# Patient Record
Sex: Male | Born: 1955
Health system: Southern US, Community
[De-identification: ages and names within clinical notes are randomized; demographics above are authoritative.]

## PROBLEM LIST (undated history)

## (undated) ENCOUNTER — Ambulatory Visit (HOSPITAL_COMMUNITY): Admission: EM

## (undated) DIAGNOSIS — C3490 Malignant neoplasm of unspecified part of unspecified bronchus or lung: Secondary | ICD-10-CM

## (undated) DIAGNOSIS — R079 Chest pain, unspecified: Secondary | ICD-10-CM

## (undated) DIAGNOSIS — E119 Type 2 diabetes mellitus without complications: Secondary | ICD-10-CM

## (undated) DIAGNOSIS — R053 Chronic cough: Secondary | ICD-10-CM

## (undated) DIAGNOSIS — R569 Unspecified convulsions: Secondary | ICD-10-CM

## (undated) DIAGNOSIS — I10 Essential (primary) hypertension: Secondary | ICD-10-CM

## (undated) DIAGNOSIS — M7071 Other bursitis of hip, right hip: Secondary | ICD-10-CM

## (undated) DIAGNOSIS — F17210 Nicotine dependence, cigarettes, uncomplicated: Secondary | ICD-10-CM

## (undated) DIAGNOSIS — R05 Cough: Secondary | ICD-10-CM

## (undated) DIAGNOSIS — G9389 Other specified disorders of brain: Secondary | ICD-10-CM

## (undated) DIAGNOSIS — Z8589 Personal history of malignant neoplasm of other organs and systems: Secondary | ICD-10-CM

## (undated) DIAGNOSIS — C801 Malignant (primary) neoplasm, unspecified: Secondary | ICD-10-CM

## (undated) HISTORY — DX: Cough: R05

## (undated) HISTORY — DX: Nicotine dependence, cigarettes, uncomplicated: F17.210

## (undated) HISTORY — DX: Personal history of malignant neoplasm of other organs and systems: Z85.89

## (undated) HISTORY — PX: NO PAST SURGERIES: SHX2092

## (undated) HISTORY — DX: Essential (primary) hypertension: I10

## (undated) HISTORY — DX: Malignant neoplasm of unspecified part of unspecified bronchus or lung: C34.90

## (undated) HISTORY — DX: Other bursitis of hip, right hip: M70.71

## (undated) HISTORY — DX: Chronic cough: R05.3

---
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
## 1998-04-18 ENCOUNTER — Encounter: Payer: Self-pay | Admitting: Emergency Medicine

## 1998-04-18 ENCOUNTER — Emergency Department (HOSPITAL_COMMUNITY): Admission: EM | Admit: 1998-04-18 | Discharge: 1998-04-18 | Payer: Self-pay | Admitting: Emergency Medicine

## 1998-06-02 ENCOUNTER — Emergency Department (HOSPITAL_COMMUNITY): Admission: EM | Admit: 1998-06-02 | Discharge: 1998-06-02 | Payer: Self-pay | Admitting: Emergency Medicine

## 2000-03-03 ENCOUNTER — Emergency Department (HOSPITAL_COMMUNITY): Admission: EM | Admit: 2000-03-03 | Discharge: 2000-03-03 | Payer: Self-pay | Admitting: Emergency Medicine

## 2000-03-03 ENCOUNTER — Encounter: Payer: Self-pay | Admitting: Emergency Medicine

## 2000-06-23 ENCOUNTER — Emergency Department (HOSPITAL_COMMUNITY): Admission: EM | Admit: 2000-06-23 | Discharge: 2000-06-23 | Payer: Self-pay | Admitting: Emergency Medicine

## 2000-06-23 ENCOUNTER — Encounter: Payer: Self-pay | Admitting: Emergency Medicine

## 2001-01-28 ENCOUNTER — Emergency Department (HOSPITAL_COMMUNITY): Admission: EM | Admit: 2001-01-28 | Discharge: 2001-01-28 | Payer: Self-pay | Admitting: Emergency Medicine

## 2003-05-11 ENCOUNTER — Emergency Department (HOSPITAL_COMMUNITY): Admission: EM | Admit: 2003-05-11 | Discharge: 2003-05-11 | Payer: Self-pay | Admitting: Emergency Medicine

## 2004-02-05 ENCOUNTER — Emergency Department (HOSPITAL_COMMUNITY): Admission: EM | Admit: 2004-02-05 | Discharge: 2004-02-05 | Payer: Self-pay

## 2005-05-17 ENCOUNTER — Emergency Department (HOSPITAL_COMMUNITY): Admission: EM | Admit: 2005-05-17 | Discharge: 2005-05-17 | Payer: Self-pay | Admitting: Emergency Medicine

## 2005-10-11 ENCOUNTER — Emergency Department (HOSPITAL_COMMUNITY): Admission: EM | Admit: 2005-10-11 | Discharge: 2005-10-11 | Payer: Self-pay | Admitting: Emergency Medicine

## 2006-02-04 ENCOUNTER — Emergency Department (HOSPITAL_COMMUNITY): Admission: EM | Admit: 2006-02-04 | Discharge: 2006-02-04 | Payer: Self-pay | Admitting: Emergency Medicine

## 2008-01-25 ENCOUNTER — Emergency Department (HOSPITAL_COMMUNITY): Admission: EM | Admit: 2008-01-25 | Discharge: 2008-01-25 | Payer: Self-pay | Admitting: *Deleted

## 2009-06-19 ENCOUNTER — Emergency Department (HOSPITAL_COMMUNITY): Admission: EM | Admit: 2009-06-19 | Discharge: 2009-06-19 | Payer: Self-pay | Admitting: Emergency Medicine

## 2009-06-23 ENCOUNTER — Emergency Department (HOSPITAL_COMMUNITY): Admission: EM | Admit: 2009-06-23 | Discharge: 2009-06-23 | Payer: Self-pay | Admitting: Emergency Medicine

## 2013-07-08 ENCOUNTER — Ambulatory Visit: Payer: Self-pay

## 2017-02-07 ENCOUNTER — Other Ambulatory Visit: Payer: Self-pay | Admitting: Physician Assistant

## 2017-02-07 DIAGNOSIS — M79604 Pain in right leg: Secondary | ICD-10-CM

## 2017-02-07 DIAGNOSIS — M7989 Other specified soft tissue disorders: Secondary | ICD-10-CM

## 2017-02-08 ENCOUNTER — Ambulatory Visit
Admission: RE | Admit: 2017-02-08 | Discharge: 2017-02-08 | Disposition: A | Discharge status: Home / Self Care | Payer: Self-pay | Source: Ambulatory Visit | Attending: Physician Assistant | Admitting: Physician Assistant

## 2017-02-08 DIAGNOSIS — M79604 Pain in right leg: Secondary | ICD-10-CM

## 2017-02-08 DIAGNOSIS — M7989 Other specified soft tissue disorders: Secondary | ICD-10-CM

## 2018-06-23 ENCOUNTER — Other Ambulatory Visit: Payer: Self-pay

## 2018-06-23 ENCOUNTER — Emergency Department (HOSPITAL_COMMUNITY)
Admission: EM | Admit: 2018-06-23 | Discharge: 2018-06-24 | Disposition: A | Discharge status: Home / Self Care | Payer: Self-pay | Attending: Emergency Medicine | Admitting: Emergency Medicine

## 2018-06-23 ENCOUNTER — Emergency Department (HOSPITAL_COMMUNITY): Payer: Self-pay

## 2018-06-23 ENCOUNTER — Encounter (HOSPITAL_COMMUNITY): Payer: Self-pay | Admitting: Emergency Medicine

## 2018-06-23 DIAGNOSIS — I10 Essential (primary) hypertension: Secondary | ICD-10-CM | POA: Insufficient documentation

## 2018-06-23 DIAGNOSIS — R0789 Other chest pain: Secondary | ICD-10-CM | POA: Insufficient documentation

## 2018-06-23 DIAGNOSIS — F17200 Nicotine dependence, unspecified, uncomplicated: Secondary | ICD-10-CM | POA: Insufficient documentation

## 2018-06-23 LAB — I-STAT TROPONIN, ED: Troponin i, poc: 0.01 ng/mL (ref 0.00–0.08)

## 2018-06-23 MED ORDER — SODIUM CHLORIDE 0.9% FLUSH: 3.0000 mL | Freq: Once | INTRAVENOUS | Status: DC

## 2018-06-23 NOTE — ED Triage Notes (Signed)
Pt c/o chest pain that started 30 minutes PTA. Denies associated symptoms.

## 2018-06-24 LAB — I-STAT TROPONIN, ED: Troponin i, poc: 0 ng/mL (ref 0.00–0.08)

## 2018-06-24 LAB — CBC
HCT: 44.1 % (ref 39.0–52.0)
Hemoglobin: 15 g/dL (ref 13.0–17.0)
MCH: 30.4 pg (ref 26.0–34.0)
MCHC: 34 g/dL (ref 30.0–36.0)
MCV: 89.5 fL (ref 80.0–100.0)
Platelets: 477 10*3/uL — ABNORMAL HIGH (ref 150–400)
RBC: 4.93 MIL/uL (ref 4.22–5.81)
RDW: 13.1 % (ref 11.5–15.5)
WBC: 10.2 10*3/uL (ref 4.0–10.5)
nRBC: 0 % (ref 0.0–0.2)

## 2018-06-24 LAB — BASIC METABOLIC PANEL
Anion gap: 9 (ref 5–15)
BUN: 15 mg/dL (ref 8–23)
CO2: 23 mmol/L (ref 22–32)
Calcium: 9.2 mg/dL (ref 8.9–10.3)
Chloride: 108 mmol/L (ref 98–111)
Creatinine, Ser: 0.8 mg/dL (ref 0.61–1.24)
GFR calc Af Amer: >60 mL/min (ref >60)
GFR calc non Af Amer: >60 mL/min (ref >60)
Glucose, Bld: 113 mg/dL — ABNORMAL HIGH (ref 70–99)
Potassium: 4.3 mmol/L (ref 3.5–5.1)
Sodium: 140 mmol/L (ref 135–145)

## 2018-06-24 MED ORDER — LOSARTAN POTASSIUM 25 MG PO TABS: 25.0000 mg | ORAL_TABLET | Freq: Every day | ORAL | 0 refills | Status: DC

## 2018-06-24 MED ORDER — ASPIRIN 81 MG PO CHEW
324.0000 mg | CHEWABLE_TABLET | Freq: Once | ORAL | Status: AC
  Administered 2018-06-24: 324 mg via ORAL
  Filled 2018-06-24: qty 4

## 2018-06-24 NOTE — ED Provider Notes (Signed)
[facility] EMERGENCY DEPARTMENT Provider Note   CSN: [id number] Arrival date & time: 06/23/18  2314     History   Chief Complaint Chief Complaint  Patient presents with  . Chest Pain    HPI Jared Shea is a 63 y.o. male.  The history is provided by the patient.  Chest Pain  He has history of hypertension and comes in because of episodic chest pain which started yesterday.  He describes a sharp pain in the left lower anterior chest which last for a few seconds before going away.  Pain is rated at 7/10 when present.  There was slight associated dyspnea but no nausea or diaphoresis.  Pain does not seem to bother him when he is moving around but seems to come on more when he is laying still.  He denies history of diabetes or hyperlipidemia.  He is a cigarette smoker.  There is no family history of premature coronary atherosclerosis.  He had been seen at an urgent care center about 3 weeks ago and was told that his ECG at that time showed he had had a heart attack.  Past Medical History:  Diagnosis Date  . Hypertension     There are no active problems to display for this patient.   History reviewed. No pertinent surgical history.      Home Medications    Prior to Admission medications   Not on File    Family History No family history on file.  Social History Social History   Tobacco Use  . Smoking status: Current Every Day Smoker  . Smokeless tobacco: Never Used  Substance Use Topics  . Alcohol use: Yes  . Drug use: Never     Allergies   Penicillins   Review of Systems Review of Systems  Cardiovascular: Positive for chest pain.  All other systems reviewed and are negative.    Physical Exam Updated Vital Signs BP (!) 171/88 (BP Location: Left Arm)   Pulse 66   Temp 97.8 F (36.6 C) (Oral)   Resp 16   SpO2 98%   Physical Exam Vitals signs and nursing note reviewed.    63 year old male, resting comfortably and in no acute  distress. Vital signs are significant for elevated blood pressure. Oxygen saturation is 98%, which is normal. Head is normocephalic and atraumatic. PERRLA, EOMI. Oropharynx is clear. Neck is nontender and supple without adenopathy or JVD. Back is nontender and there is no CVA tenderness. Lungs are clear without rales, wheezes, or rhonchi. Chest is nontender. Heart has regular rate and rhythm without murmur. Abdomen is soft, flat, nontender without masses or hepatosplenomegaly and peristalsis is normoactive. Extremities have no cyanosis or edema, full range of motion is present. Skin is warm and dry without rash. Neurologic: Mental status is normal, cranial nerves are intact, there are no motor or sensory deficits.  ED Treatments / Results  Labs (all labs ordered are listed, but only abnormal results are displayed) Labs Reviewed  BASIC METABOLIC PANEL - Abnormal; Notable for the following components:      Result Value   Glucose, Bld 113 (*)    All other components within normal limits  CBC - Abnormal; Notable for the following components:   Platelets 477 (*)    All other components within normal limits  I-STAT TROPONIN, ED  I-STAT TROPONIN, ED    EKG EKG Interpretation  Date/Time:  Tuesday June 24 2018 01:28:15 EST Ventricular Rate:  63 PR Interval:  QRS Duration: 97 QT Interval:  397 QTC Calculation: 407 R Axis:   70 Text Interpretation:  Sinus rhythm Anteroseptal infarct, old Nonspecific ST abnormality No significant change was found Confirmed by Delora Fuel ([id number]) on 06/24/2018 3:17:12 AM   Radiology Dg Chest 2 View  Result Date: 06/23/2018 CLINICAL DATA:  Left upper chest pain for 1 hour EXAM: CHEST - 2 VIEW COMPARISON:  None. FINDINGS: The heart size and mediastinal contours are within normal limits. Both lungs are hyperinflated but clear. The visualized skeletal structures are unremarkable. IMPRESSION: COPD without acute abnormality. Electronically Signed   By:  Inez Catalina M.D.   On: 06/23/2018 23:46    Procedures Procedures  Medications Ordered in ED Medications  sodium chloride flush (NS) 0.9 % injection 3 mL (has no administration in time range)     Initial Impression / Assessment and Plan / ED Course  I have reviewed the triage vital signs and the nursing notes.  Pertinent labs & imaging results that were available during my care of the patient were reviewed by me and considered in my medical decision making (see chart for details).  Chest pain which seems quite atypical for ACS.  Old records are reviewed confirming urgent care visit on January 21.  However, I cannot see the ECG that was done on that occasion nor can I see a report from it.  He was started on Cozaar at that visit.  He will be held in the ED for delta troponin.  If negative, will discharge with referral to cardiology.  ECG shows minor, nonspecific ST changes.  Repeat troponin is normal.  He is referred to cardiology for outpatient work-up.  Final Clinical Impressions(s) / ED Diagnoses   Final diagnoses:  Atypical chest pain    ED Discharge Orders    None       Delora Fuel, MD 70/96/43 [phone]

## 2018-06-24 NOTE — ED Notes (Signed)
Patient verbalizes understanding of medications and discharge instructions. No further questions at this time. VSS and patient ambulatory at discharge.   

## 2018-06-24 NOTE — Discharge Instructions (Addendum)
Your evaluation in the Emergency Department did not show any sign of heart damage. Please follow up with the cardiologist for further evaluation of your heart.  Return to the Emergency Department if your symptoms are getting worse.

## 2018-07-28 ENCOUNTER — Ambulatory Visit (INDEPENDENT_AMBULATORY_CARE_PROVIDER_SITE_OTHER): Payer: Self-pay | Admitting: Internal Medicine

## 2018-07-28 VITALS — BP 154/82 | HR 68 | Temp 97.7°F | Wt 190.4 lb

## 2018-07-28 DIAGNOSIS — E1159 Type 2 diabetes mellitus with other circulatory complications: Secondary | ICD-10-CM | POA: Insufficient documentation

## 2018-07-28 DIAGNOSIS — I10 Essential (primary) hypertension: Secondary | ICD-10-CM

## 2018-07-28 DIAGNOSIS — Z79899 Other long term (current) drug therapy: Secondary | ICD-10-CM

## 2018-07-28 HISTORY — DX: Essential (primary) hypertension: I10

## 2018-07-28 HISTORY — DX: Type 2 diabetes mellitus with other circulatory complications: E11.59

## 2018-07-28 MED ORDER — LOSARTAN POTASSIUM-HCTZ 50-12.5 MG PO TABS: 1.0000 | ORAL_TABLET | Freq: Every day | ORAL | 2 refills | Status: DC

## 2018-07-28 MED FILL — LOSARTAN-HCTZ 50-12.5 MG TA: 50-12.5 | 30 days supply | Qty: 30 | Fill #0 | Status: TO

## 2018-07-28 NOTE — Progress Notes (Signed)
Internal Medicine Clinic Attending  Case discussed with Dr. Hoffman at the time of the visit.  We reviewed the resident's history and exam and pertinent patient test results.  I agree with the assessment, diagnosis, and plan of care documented in the resident's note.  

## 2018-07-28 NOTE — Patient Instructions (Addendum)
Jared Shea,  It was a pleasure meeting you today. Your new medication has been sent to the [facility].

## 2018-07-28 NOTE — Progress Notes (Signed)
   CC: high blood pressure  HPI:  [name] is a 63 y.o. male with history noted below that presents to the internal medicine clinic for assessment of essential hypertension. Please see problem based charting for the status of patient's chronic medical conditions.  Past Medical History:  Diagnosis Date  . Hypertension     Review of Systems:  Review of Systems  Respiratory: Negative for shortness of breath.   Cardiovascular: Negative for chest pain.  Neurological: Negative for dizziness.     Physical Exam:  Vitals:   07/28/18 1031  BP: (!) 154/82  Pulse: 68  Temp: 97.7 F (36.5 C)  TempSrc: Oral  SpO2: 98%  Weight: 190 lb 6.4 oz (86.4 kg)   Physical Exam  Constitutional: He is well-developed, well-nourished, and in no distress.  Cardiovascular: Normal rate, regular rhythm and normal heart sounds. Exam reveals no gallop and no friction rub.  No murmur heard. Pulmonary/Chest: Breath sounds normal. No respiratory distress. He has no wheezes. He has no rales.    Assessment & Plan:   See encounters tab for problem based medical decision making.   Patient discussed with Dr. Angelia Mould

## 2018-07-28 NOTE — Assessment & Plan Note (Addendum)
History of present illness:  Patient started taking losartan at the end of January for elevated blood pressure at urgent care. Today he presents for assessment of blood pressure and refills.  Patient reports adherence to medication.  He denies side effects of the medication.  Assessment: Essential hypertension Patient was started on losartan 2 months ago and blood pressure is still elevated at 154/82. At this time will increase losartan and add hydrochlorothiazide to use as a combination pill. This was sent to the [facility].  Patient had recent blood work in the ED last month and will not repeat at this time.  Plan -Losartan-hydrochlorothiazide-50-12.5 -bmet at next visit in 3 months -Can get a hemoglobin A1c and lipid panel at next visit when patient has insurance

## 2018-08-01 ENCOUNTER — Telehealth: Payer: Self-pay | Admitting: Cardiology

## 2018-08-01 ENCOUNTER — Encounter: Payer: Self-pay | Admitting: Cardiology

## 2018-08-01 NOTE — Telephone Encounter (Signed)
Called pt and left message asking pt to call back to update medical and family history and to get previous physician information.

## 2018-08-08 ENCOUNTER — Telehealth: Payer: Self-pay

## 2018-08-08 NOTE — Telephone Encounter (Signed)
TELEPHONE CALL NOTE  This patient has been deemed a candidate for follow-up tele-health visit to limit community exposure during the Covid-19 pandemic. I spoke with the patient via phone to discuss instructions. This has been outlined on the patient's AVS (dotphrase: hcevisitinfo). The patient was advised to review the section on consent for treatment as well. The patient will receive a phone call 2-3 days prior to their E-Visit at which time consent will be verbally confirmed. A Virtual Office Visit appointment type has been scheduled for 9 am 08/12/18 with Dr. Radford Pax.  Sarina Ill, RN 08/08/2018 9:01 AM    In the setting of the current Covid19 crisis, you are scheduled for a (phone or video) visit with your provider on (date) at (time).  Just as we do with many in-office visits, in order for you to participate in this visit, we must obtain consent.  If you'd like, I can send this to your mychart (if signed up) or email for you to review.  Otherwise, I can obtain your verbal consent now.  All virtual visits are billed to your insurance company just like a normal visit would be.  By agreeing to a virtual visit, we'd like you to understand that the technology does not allow for your provider to perform an examination, and thus may limit your provider's ability to fully assess your condition.  Finally, though the technology is pretty good, we cannot assure that it will always work on either your or our end, and in the setting of a video visit, we may have to convert it to a phone-only visit.  In either situation, we cannot ensure that we have a secure connection.  Are you willing to proceed?  TELEPHONE CALL NOTE  Jared Shea has been deemed a candidate for a follow-up tele-health visit to limit community exposure during the Covid-19 pandemic. I spoke with the patient via phone to ensure availability of phone/video source, confirm preferred email & phone number, and discuss instructions and  expectations.  I reminded Jared Shea to be prepared with any vital sign and/or heart rhythm information that could potentially be obtained via home monitoring, at the time of his visit. I reminded Jared Shea to expect an e-mail containing a link for their video-based visit approximately 15 minutes before his visit, or alternatively, a phone call at the time of his visit if his visit is planned to be a phone encounter.  STAFF MUST READ CONSENT VERBATIM TO PATIENT BELOW - Did the patient verbally consent to treatment as below? [place], RN 08/08/2018 9:02 AM  DOWNLOADING THE SOFTWARE (If applicable)  Download the [employer] app to enable video and telephone visits with your [facility] Provider.   Instructions for downloading [employer] WebEx: - Go to https://www.webex.com/downloads.html and follow the instructions - If you have technical difficulties with downloading WebEx, please call WebEx at [phone]. - Once the app is downloaded (can be done on either mobile or desktop computer), go to Settings in the upper left hand corner.  Be sure that camera and audio are enabled.  - You will receive an email message with a link to the meeting with a time to join for your tele-health visit.  - Please download the app and have settings configured prior to the appointment time.    CONSENT FOR TELE-HEALTH VISIT - PLEASE REVIEW  I hereby voluntarily request, consent and authorize [facility] and its employed or contracted physicians, Engineer, materials, nurse practitioners  or other licensed health care professionals (the Practitioner), to provide me with telemedicine health care services (the "Services") as deemed necessary by the treating Practitioner. I acknowledge and consent to receive the Services by the Practitioner via telemedicine. I understand that the telemedicine visit will involve communicating with the Practitioner through live audiovisual communication technology and  the disclosure of certain medical information by electronic transmission. I acknowledge that I have been given the opportunity to request an in-person assessment or other available alternative prior to the telemedicine visit and am voluntarily participating in the telemedicine visit.  I understand that I have the right to withhold or withdraw my consent to the use of telemedicine in the course of my care at any time, without affecting my right to future care or treatment, and that the Practitioner or I may terminate the telemedicine visit at any time. I understand that I have the right to inspect all information obtained and/or recorded in the course of the telemedicine visit and may receive copies of available information for a reasonable fee.  I understand that some of the potential risks of receiving the Services via telemedicine include:  Marland Kitchen Delay or interruption in medical evaluation due to technological equipment failure or disruption; . Information transmitted may not be sufficient (e.g. poor resolution of images) to allow for appropriate medical decision making by the Practitioner; and/or  . In rare instances, security protocols could fail, causing a breach of personal health information.  Furthermore, I acknowledge that it is my responsibility to provide information about my medical history, conditions and care that is complete and accurate to the best of my ability. I acknowledge that Practitioner's advice, recommendations, and/or decision may be based on factors not within their control, such as incomplete or inaccurate data provided by me or distortions of diagnostic images or specimens that may result from electronic transmissions. I understand that the practice of medicine is not an exact science and that Practitioner makes no warranties or guarantees regarding treatment outcomes. I acknowledge that I will receive a copy of this consent concurrently upon execution via email to the email address I  last provided but may also request a printed copy by calling the office of [facility].    I understand that my insurance will be billed for this visit.   I have read or had this consent read to me. . I understand the contents of this consent, which adequately explains the benefits and risks of the Services being provided via telemedicine.  . I have been provided ample opportunity to ask questions regarding this consent and the Services and have had my questions answered to my satisfaction. . I give my informed consent for the services to be provided through the use of telemedicine in my medical care  By participating in this telemedicine visit I agree to the above.

## 2018-08-11 NOTE — Progress Notes (Signed)
Virtual Visit via Telephone Note    Evaluation Performed:  Follow-up visit  This visit type was conducted due to national recommendations for restrictions regarding the COVID-19 Pandemic (e.g. social distancing).  This format is felt to be most appropriate for this patient at this time.  All issues noted in this document were discussed and addressed.  No physical exam was performed (except for noted visual exam findings with Video Visits).  Please refer to the patient's chart (MyChart message for video visits and phone note for telephone visits) for the patient's consent to telehealth for [facility].  Date:  08/12/2018   ID:  Jared Shea, Jared Shea 01-03-1956, MRN [id number]  Patient Location:  [facility]  Provider location:   [facility]  PCP:  Patient, No Pcp Per Referred by [facility] ER Cardiologist:  New Electrophysiologist:  None   Chief Complaint:  Chest pain  History of Present Illness:    Jared Shea is a 63 y.o. male who presents via audio/video conferencing for a telehealth visit today.    This is a 63yo male with a history of HTN who presented to ER on 06/24/2018 with complaints of chest pain.  He tells me that the pain was sharp in the lower anterior chest lasting a few seconds at a time and then would go away.  There was no nausea or diaphoresis but he did have some mild SOB.  The pain was nonexertional and maybe worse when laying down. He initially was seen in Urgent Care and was told he had an EKG that looked like he had had an MI. In ER EKG showed anteroseptal infarct pattern and nonspecific ST abnormality.  He ruled out for MI with normal Trop.  He was placed on antihypertensive medication because his blood pressure was poorly controlled.  Now referred for further evaluation.  Since going to the ER a month ago his CP has resolved.  He said he did have some more chest discomfort when he ran out of his blood pressure medicine but now it has resolved and he has not had any in a while.   Even though he told the ER his pain was sharp he tells me that it was more of a pressure sensation but there was no radiation of the pain and no associated nausea or diaphoresis.  He denies any PND, orthopnea, lower extremity edema, palpitations or dizziness.  He says once in a great while he will have some mild dyspnea on exertion with certain activities.  He does smoke and 1 pack of cigarettes will last him a few days.  He is eager to try to quit smoking.  He did ask about medications to help quit smoking.  He does have a family history of CAD in his mother had a CABG and he thinks PCI in the past.  The patient does not symptoms concerning for COVID-19 infection (fever, chills, cough, or new shortness of breath).    Prior CV studies:   The following studies were reviewed today:  Hospital notes and labs, EKG  Past Medical History:  Diagnosis Date  . Bursitis of right hip   . Chronic cough   . Dependence on nicotine from cigarettes   . Essential hypertension 07/28/2018   Past Surgical History:  Procedure Laterality Date  . NO PAST SURGERIES       Current Meds  Medication Sig  . aspirin EC 325 MG tablet Take 325 mg by mouth daily.  Marland Kitchen losartan-hydrochlorothiazide (HYZAAR) 50-12.5 MG tablet Take  1 tablet by mouth daily.     Allergies:   Penicillins   Social History   Tobacco Use  . Smoking status: Current Every Day Smoker    Years: 48.00    Types: Cigarettes    Start date: 08/07/1970  . Smokeless tobacco: Never Used  Substance Use Topics  . Alcohol use: Yes  . Drug use: Never     Family Hx: The patient's family history includes Diabetes in his brother; Healthy in his daughter, daughter, and son; Heart disease in his mother; Hypertension in his sister; Kidney disease in his brother and sister.  ROS:   Please see the history of present illness.     All other systems reviewed and are negative.   Labs/Other Tests and Data Reviewed:    Recent Labs: 06/23/2018: BUN 15;  Creatinine, Ser 0.80; Hemoglobin 15.0; Platelets 477; Potassium 4.3; Sodium 140   Recent Lipid Panel No results found for: CHOL, TRIG, HDL, CHOLHDL, LDLCALC, LDLDIRECT  Wt Readings from Last 3 Encounters:  08/12/18 198 lb (89.8 kg)  07/28/18 190 lb 6.4 oz (86.4 kg)  06/24/18 170 lb (77.1 kg)     Exam:    Vital Signs:  BP 120/81 Comment: per pt  Ht 6\' 1"  (1.854 m)   Wt 198 lb (89.8 kg)   BMI 26.12 kg/m    Well nourished, well developed male in no acute distress.   ASSESSMENT & PLAN:    1.  Chest pain -I suspect his chest pain is more related to poorly controlled high blood pressure.  Ever since going on blood pressure medicines he has not really had any more chest pressure.  That being said, he does have cardiac risk factors including ongoing tobacco abuse, hypertension and a family history of CAD.  I have recommended getting a stress Myoview and 2D echocardiogram.  In the setting of current [place] crisis, but the patient is asymptomatic and I think he can wait until June to have his studies done.  I have encouraged him to continue taking his aspirin but decrease it to 81 mg daily.  I have also told him to let me know if he has recurrent chest pain.  2.  HTN - BP is controlled on exam today.  He will continue on Losartan-HCT 50-12.5mg  daily.  His last creatinine was 0.8 and K+ 4.3.  3.  Tobacco abuse counseling - we discussed the need to try to cut back and quit tobacco.  He is motivated to quit and did ask about smoking cessation drugs.  We discussed Chantix.  He wants to think about it and talk to his pharmacy about how much it will cost and then he will give [place] a call.  COVID-19 Education: The signs and symptoms of COVID-19 were discussed with the patient and how to seek care for testing (follow up with PCP or arrange E-visit).  The importance of social distancing was discussed today.  Patient Risk:   After full review of this patients clinical status, I feel that they are at  least moderate risk at this time.  Time:   Today, I have spent 25 minutes with the patient with telehealth technology discussing chest pain, hypertension, tobacco abuse, testing needed and teaching on prevention of covid 19 and symptoms associated with.     Medication Adjustments/Labs and Tests Ordered: Current medicines are reviewed at length with the patient today.  Concerns regarding medicines are outlined above.  Tests Ordered: No orders of the defined types were placed in  this encounter.  Medication Changes: No orders of the defined types were placed in this encounter.   Disposition:  Follow up in 4 month(s)  Signed, Fransico Him, MD  08/12/2018 10:05 AM    [facility]

## 2018-08-12 ENCOUNTER — Other Ambulatory Visit: Payer: Self-pay

## 2018-08-12 ENCOUNTER — Encounter: Payer: Self-pay | Admitting: Cardiology

## 2018-08-12 ENCOUNTER — Telehealth (INDEPENDENT_AMBULATORY_CARE_PROVIDER_SITE_OTHER): Payer: Self-pay | Admitting: Cardiology

## 2018-08-12 DIAGNOSIS — F17210 Nicotine dependence, cigarettes, uncomplicated: Secondary | ICD-10-CM

## 2018-08-12 DIAGNOSIS — F17200 Nicotine dependence, unspecified, uncomplicated: Secondary | ICD-10-CM | POA: Insufficient documentation

## 2018-08-12 DIAGNOSIS — R079 Chest pain, unspecified: Secondary | ICD-10-CM | POA: Insufficient documentation

## 2018-08-12 DIAGNOSIS — Z716 Tobacco abuse counseling: Secondary | ICD-10-CM

## 2018-08-12 DIAGNOSIS — I10 Essential (primary) hypertension: Secondary | ICD-10-CM

## 2018-08-12 HISTORY — DX: Chest pain, unspecified: R07.9

## 2018-08-12 NOTE — Addendum Note (Signed)
Addended by: Sarina Ill on: 08/12/2018 01:29 PM   Modules accepted: Orders

## 2018-08-12 NOTE — Patient Instructions (Signed)
Medication Instructions:  Your physician recommends that you continue on your current medications as directed. Please refer to the Current Medication list given to you today.  If you need a refill on your cardiac medications before your next appointment, please call your pharmacy.   Lab work: None If you have labs (blood work) drawn today and your tests are completely normal, you will receive your results only by: Marland Kitchen MyChart Message (if you have MyChart) OR . A paper copy in the mail If you have any lab test that is abnormal or we need to change your treatment, we will call you to review the results.  Testing/Procedures: Your physician has requested that you have an echocardiogram around June. Echocardiography is a painless test that uses sound waves to create images of your heart. It provides your doctor with information about the size and shape of your heart and how well your heart's chambers and valves are working. This procedure takes approximately one hour. There are no restrictions for this procedure.  Your physician has requested that you have en exercise stress myoview around June. For further information please visit [email or web address]. Please follow instruction sheet, as given.  Follow-Up:Your physician recommends that you schedule a follow-up appointment in: In July, after your results of your tests. Our office will call to schedule your appointment.

## 2018-08-12 NOTE — Addendum Note (Signed)
Addended by: Sarina Ill on: 08/12/2018 03:52 PM   Modules accepted: Orders

## 2018-09-01 ENCOUNTER — Telehealth: Payer: Self-pay | Admitting: Cardiology

## 2018-09-01 ENCOUNTER — Other Ambulatory Visit: Payer: Self-pay | Admitting: Cardiology

## 2018-09-01 ENCOUNTER — Other Ambulatory Visit: Payer: Self-pay

## 2018-09-01 MED ORDER — LOSARTAN POTASSIUM-HCTZ 50-12.5 MG PO TABS: 1.0000 | ORAL_TABLET | Freq: Every day | ORAL | 2 refills | Status: DC

## 2018-09-01 NOTE — Telephone Encounter (Signed)
losartan-hydrochlorothiazide (HYZAAR) 50-12.5 MG tablet, refill request @ [facility].

## 2018-09-01 NOTE — Telephone Encounter (Signed)
Pt calling requesting a refill on losartan-HCTZ. Would Dr. Radford Pax like to refill this medication? Please address

## 2018-09-01 NOTE — Telephone Encounter (Signed)
°*  STAT* If patient is at the pharmacy, call can be transferred to refill team.   1. Which medications need to be refilled? (please list name of each medication and dose if known) losartan-hydrochlorothiazide (HYZAAR) 50-12.5 MG tablet  2. Which pharmacy/location (including street and city if local pharmacy) is medication to be sent to? [facility] on [address]   3. Do they need a 30 day or 90 day supply? 30 days.

## 2018-09-02 NOTE — Telephone Encounter (Signed)
Informed pt his rx was refilled by his cardiologist's office and sent to [facility] [place]. States he will call the pharmacy.

## 2018-09-02 NOTE — Telephone Encounter (Signed)
Pt's medication was already sent to his pharmacy as requested. Confirmation received.

## 2018-09-06 MED FILL — LOSARTAN POTASSIUM 50 MG TA: 50 | 30 days supply | Qty: 30 | Fill #0

## 2018-09-06 MED FILL — HYDROCHLOROTHIAZIDE 12.5 MG: 12.5 | 30 days supply | Qty: 30 | Fill #0

## 2018-09-15 ENCOUNTER — Telehealth: Payer: Self-pay

## 2018-09-15 NOTE — Telephone Encounter (Signed)
Pt states he still have not received bp med. Please call pt back.

## 2018-09-15 NOTE — Telephone Encounter (Signed)
Refill Request-Paid [facility] 2 weeks ago and the medicine has not been mailed to him yet.  Patient would like a call back.  losartan-hydrochlorothiazide (HYZAAR) 50-12.5 MG tablet

## 2018-09-15 NOTE — Telephone Encounter (Signed)
Patient given number to [place] [phone]. Hubbard Hartshorn, RN, BSN

## 2018-10-02 ENCOUNTER — Ambulatory Visit (INDEPENDENT_AMBULATORY_CARE_PROVIDER_SITE_OTHER): Payer: Self-pay | Admitting: Internal Medicine

## 2018-10-02 ENCOUNTER — Other Ambulatory Visit: Payer: Self-pay

## 2018-10-02 VITALS — BP 133/74 | HR 78 | Temp 97.9°F | Wt 189.7 lb

## 2018-10-02 DIAGNOSIS — Z716 Tobacco abuse counseling: Secondary | ICD-10-CM

## 2018-10-02 DIAGNOSIS — Z79899 Other long term (current) drug therapy: Secondary | ICD-10-CM

## 2018-10-02 DIAGNOSIS — F17210 Nicotine dependence, cigarettes, uncomplicated: Secondary | ICD-10-CM

## 2018-10-02 DIAGNOSIS — I10 Essential (primary) hypertension: Secondary | ICD-10-CM

## 2018-10-02 NOTE — Patient Instructions (Addendum)
Thank you for coming to the clinic today. It was a pleasure to see you.   For your hypertension - continue taking losartan and HCTZ   Keep up the hard work that you are doing to cut back on the cigarette smoking. Check out 1-800 quit website for advice and guidance.   Please call the internal medicine center clinic if you have any questions or concerns, we may be able to help and keep you from a long and expensive emergency room wait. Our clinic and after hours phone number is [phone], the best time to call is Monday through Friday 9 am to 4 pm but there is always someone available 24/7 if you have an emergency. If you need medication refills please notify your pharmacy one week in advance and they will send [place] a request.

## 2018-10-02 NOTE — Progress Notes (Signed)
   CC: follow up of hypertension   HPI:  Jared Shea is a 63 y.o. with PMH as listed below who presents for follow up of hypertension. Please see the assessment and plans for the status of the patient chronic medical problems.   Past Medical History:  Diagnosis Date  . Bursitis of right hip   . Chronic cough   . Dependence on nicotine from cigarettes   . Essential hypertension 07/28/2018   Review of Systems:  Refer to history of present illness and assessment and plans for pertinent review of systems, all others reviewed and negative  Physical Exam:  Vitals:   10/02/18 1046  BP: 133/74  Pulse: 78  Temp: 97.9 F (36.6 C)  TempSrc: Oral  SpO2: 100%  Weight: 189 lb 11.2 oz (86 kg)   General: well appearing, no acute distress Eyes: no conjunctivitis, no jaundice  Cardiac: regular rate and rhythm, no murmurs, rubs, or gallops, no peripheral edema  Pulm: lungs are clear to auscultation, no wheezes or rhonchi  Skin: no rashes evident on the exposed skin   Assessment & Plan:   Follow up of blood pressure  Blood pressure well controlled today. Currently prescribed losartan and HCTZ  - continue losartan and HCTZ  - continue to encourage cessation of tobacco as described below   Tobacco use Patient interested in cutting back on tobacco use. Smokes 1/2 PPD has cut back from a whole pack per day. Has tried patch in the past, still craved cigarettes with the patch. Requesting to try nicotine gum today .   - encouraged the use of patch for a baseline nicotine replacement with gum use for cravings  - provided sample of nicotine gum with instructions on use  See Encounters Tab for problem based charting.  Patient discussed with Dr. Dareen Piano

## 2018-10-03 LAB — BMP8+ANION GAP
Anion Gap: 18 mmol/L (ref 10.0–18.0)
BUN/Creatinine Ratio: 21 (ref 10–24)
BUN: 16 mg/dL (ref 8–27)
CO2: 20 mmol/L (ref 20–29)
Calcium: 9.6 mg/dL (ref 8.6–10.2)
Chloride: 100 mmol/L (ref 96–106)
Creatinine, Ser: 0.78 mg/dL (ref 0.76–1.27)
GFR calc Af Amer: 110 mL/min/{1.73_m2} (ref >59)
GFR calc non Af Amer: 95 mL/min/{1.73_m2} (ref >59)
Glucose: 87 mg/dL (ref 65–99)
Potassium: 4.6 mmol/L (ref 3.5–5.2)
Sodium: 138 mmol/L (ref 134–144)

## 2018-10-07 ENCOUNTER — Encounter: Payer: Self-pay | Admitting: Internal Medicine

## 2018-10-07 NOTE — Assessment & Plan Note (Signed)
Blood pressure well controlled today. Currently prescribed losartan and HCTZ  - continue losartan and HCTZ  - continue to encourage cessation of tobacco as described below

## 2018-10-07 NOTE — Assessment & Plan Note (Signed)
Patient interested in cutting back on tobacco use. Smokes 1/2 PPD has cut back from a whole pack per day. Has tried patch in the past, still craved cigarettes with the patch. Requesting to try nicotine gum today .   - encouraged the use of patch for a baseline nicotine replacement with gum use for cravings  - provided sample of nicotine gum with instructions on use

## 2018-10-08 NOTE — Progress Notes (Signed)
Internal Medicine Clinic Attending  Case discussed with Dr. Blum at the time of the visit.  We reviewed the resident's history and exam and pertinent patient test results.  I agree with the assessment, diagnosis, and plan of care documented in the resident's note. 

## 2018-10-15 ENCOUNTER — Telehealth (HOSPITAL_COMMUNITY): Payer: Self-pay

## 2018-10-15 NOTE — Telephone Encounter (Signed)
Spoke with the patient, instructions given. He stated he would be here. Asked to call back with any questions. S.Tremaine Fuhriman EMTP

## 2018-10-17 ENCOUNTER — Telehealth (HOSPITAL_COMMUNITY): Payer: Self-pay | Admitting: *Deleted

## 2018-10-17 NOTE — Telephone Encounter (Signed)
COVID-19 Pre-Screening Questions:  . Do you currently have a fever? NO (yes = cancel and refer to pcp for e-visit) . Have you recently travelled on a cruise, internationally, or to [place], [place], [place], [place], [place], or [place], [place] [employer]) ? NO (yes = cancel, stay home, monitor symptoms, and contact pcp or initiate e-visit if symptoms develop) . Have you been in contact with someone that is currently pending confirmation of Covid19 testing or has been confirmed to have the [place] virus?  NO (yes = cancel, stay home, away from tested individual, monitor symptoms, and contact pcp or initiate e-visit if symptoms develop) . Are you currently experiencing fatigue or cough? NO (yes = pt should be prepared to have a mask placed at the time of their visit).  . Reiterated no additional visitors. Eartha Inch no earlier than 15 minutes before appointment time. . Please bring own mask.  Confusion with tests, having Nuclear and Echo Monday, think he is straightened out.    Jared Shea

## 2018-10-20 ENCOUNTER — Ambulatory Visit (HOSPITAL_COMMUNITY): Payer: Self-pay | Attending: Cardiology

## 2018-10-20 ENCOUNTER — Ambulatory Visit (HOSPITAL_BASED_OUTPATIENT_CLINIC_OR_DEPARTMENT_OTHER): Payer: Self-pay

## 2018-10-20 ENCOUNTER — Other Ambulatory Visit: Payer: Self-pay

## 2018-10-20 DIAGNOSIS — R079 Chest pain, unspecified: Secondary | ICD-10-CM | POA: Insufficient documentation

## 2018-10-20 LAB — ECHOCARDIOGRAM COMPLETE
Height: 73 in
Weight: 3024 oz

## 2018-10-20 LAB — MYOCARDIAL PERFUSION IMAGING
LV dias vol: 105 mL (ref 62–150)
LV sys vol: 48 mL
Peak HR: 88 {beats}/min
Rest HR: 65 {beats}/min
SDS: 0
SRS: 0
SSS: 0
TID: 1.1

## 2018-10-20 MED ORDER — REGADENOSON 0.4 MG/5ML IV SOLN
0.4000 mg | Freq: Once | INTRAVENOUS | Status: AC
  Administered 2018-10-20: 0.4 mg via INTRAVENOUS

## 2018-10-20 MED ORDER — TECHNETIUM TC 99M TETROFOSMIN IV KIT
33.0000 | PACK | Freq: Once | INTRAVENOUS | Status: AC | PRN
  Administered 2018-10-20: 33 via INTRAVENOUS
  Filled 2018-10-20: qty 33

## 2018-10-20 MED ORDER — TECHNETIUM TC 99M TETROFOSMIN IV KIT
11.0000 | PACK | Freq: Once | INTRAVENOUS | Status: AC | PRN
  Administered 2018-10-20: 11 via INTRAVENOUS
  Filled 2018-10-20: qty 11

## 2018-11-18 ENCOUNTER — Other Ambulatory Visit: Payer: Self-pay | Admitting: Internal Medicine

## 2018-11-18 MED ORDER — LOSARTAN POTASSIUM-HCTZ 50-12.5 MG PO TABS: 1.0000 | ORAL_TABLET | Freq: Every day | ORAL | 5 refills | Status: DC

## 2018-11-18 MED FILL — LOSARTAN-HCTZ 50-12.5 MG TA: 50-12.5 | 30 days supply | Qty: 30 | Fill #0

## 2018-11-18 NOTE — Telephone Encounter (Signed)
Needs refill on losartan-hydrochlorothiazide (HYZAAR) 50-12.5 MG tablet [place], [place] - [address].  [phone]

## 2018-12-24 ENCOUNTER — Other Ambulatory Visit: Payer: Self-pay

## 2018-12-24 ENCOUNTER — Ambulatory Visit (INDEPENDENT_AMBULATORY_CARE_PROVIDER_SITE_OTHER): Payer: Self-pay | Admitting: Internal Medicine

## 2018-12-24 ENCOUNTER — Encounter: Payer: Self-pay | Admitting: Internal Medicine

## 2018-12-24 DIAGNOSIS — I10 Essential (primary) hypertension: Secondary | ICD-10-CM

## 2018-12-24 DIAGNOSIS — Z716 Tobacco abuse counseling: Secondary | ICD-10-CM

## 2018-12-24 DIAGNOSIS — Z79899 Other long term (current) drug therapy: Secondary | ICD-10-CM

## 2018-12-24 DIAGNOSIS — R42 Dizziness and giddiness: Secondary | ICD-10-CM

## 2018-12-24 DIAGNOSIS — G4700 Insomnia, unspecified: Secondary | ICD-10-CM

## 2018-12-24 DIAGNOSIS — Z720 Tobacco use: Secondary | ICD-10-CM

## 2018-12-24 DIAGNOSIS — Z0000 Encounter for general adult medical examination without abnormal findings: Secondary | ICD-10-CM | POA: Insufficient documentation

## 2018-12-24 MED ORDER — LOSARTAN POTASSIUM-HCTZ 50-12.5 MG PO TABS: 1.0000 | ORAL_TABLET | Freq: Every day | ORAL | 5 refills | Status: DC

## 2018-12-24 MED FILL — LOSARTAN-HCTZ 50-12.5 MG TA: 50-12.5 | 30 days supply | Qty: 30 | Fill #1

## 2018-12-24 NOTE — Assessment & Plan Note (Signed)
Hypertension: Well-controlled on losartan-HCTZ 50-12.5 mg daily.  He is doing very well and is tolerating medication.  BP Readings from Last 3 Encounters:  12/24/18 129/80  10/02/18 133/74  08/12/18 120/81   Plan: -Continue losartan-HCTZ 50-25 mg daily.

## 2018-12-24 NOTE — Telephone Encounter (Signed)
[facility]ar, they will also put some melantonin out for him

## 2018-12-24 NOTE — Progress Notes (Signed)
   CC: Dizziness  HPI:  [name] is a 63 y.o. with medical history significant for hypertension and tobacco use disorder presenting for evaluation of dizziness.  Please see problem based charting for further details.  Past Medical History:  Diagnosis Date  . Bursitis of right hip   . Chronic cough   . Dependence on nicotine from cigarettes   . Essential hypertension 07/28/2018   Review of Systems:  As per HPI  Physical Exam:  Vitals:   12/24/18 0954 12/24/18 0955 12/24/18 0956 12/24/18 0957  BP: 134/77 131/80 135/81 129/80  Pulse: 65 64 67 74  Temp: 98 F (36.7 C)     TempSrc: Oral     SpO2: 100%     Weight: 189 lb 11.2 oz (86 kg)      Physical Exam  Constitutional: He is oriented to person, place, and time and well-developed, well-nourished, and in no distress. No distress.  HENT:  Head: Normocephalic and atraumatic.  Eyes: Conjunctivae are normal.  Neck: Neck supple.  Cardiovascular: Normal rate, regular rhythm and normal heart sounds.  No murmur heard. Pulmonary/Chest: Effort normal and breath sounds normal. He has no wheezes. He has no rales.  Abdominal: Soft. Bowel sounds are normal. He exhibits no distension.  Neurological: He is alert and oriented to person, place, and time. No cranial nerve deficit. Gait normal. GCS score is 15.  Skin: He is not diaphoretic.  Psychiatric: Mood and affect normal.    Assessment & Plan:   See Encounters Tab for problem based charting.  Patient discussed with Dr. Dareen Piano

## 2018-12-24 NOTE — Assessment & Plan Note (Signed)
Advised on tobacco cessation.

## 2018-12-24 NOTE — Assessment & Plan Note (Signed)
He will receive his tetanus vaccine today.  He is due for hepatitis C screening, HIV, colonoscopy.  He wants to defer hepatitis C screen at next visit.  He is currently uninsured but states that he is waiting to get his Medicaid card before he undergoes colonoscopy.

## 2018-12-24 NOTE — Assessment & Plan Note (Signed)
Dizziness: Jared Shea reports that for the past month he has been experiencing dizziness which happens when he stands up quickly.  He denies any prodrome symptoms or chest pain, palpitation, dysrhythmia, nausea, vomiting, fevers, chills.  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  He works about 12 hours a day and usually drinks only 1-2 bottles of Gatorade throughout the entire day.  He has not experienced syncope and usually tries to get up slowly when he feels dizzy or lightheaded.  He states that sometimes these episodes will last for about 20 seconds but then will spontaneously resolve.  He denies any weakness or neurological deficits.  On physical exams cranial nerves II to XII were intact.  Orthostatic vitals: Lying: 131/80, pulse 64. Setting: 135/81, pulse 64 (stated he felt a little lightheaded during this time) Standing: 129/80, pulse 74  At this juncture, my assessment is most likely dehydration/hypovolemia due to decreased oral fluid intake.  Plan: -Advised to increase oral fluid intake. -Advised to get up slowly when he feels dizzy

## 2018-12-24 NOTE — Telephone Encounter (Signed)
Requesting to speak with a nurse about meds. Please call pt back.  

## 2018-12-24 NOTE — Assessment & Plan Note (Signed)
Insomnia: Jared Shea states he has been experiencing insomnia for the past 4 to 5 months.  He usually tries to go to bed between 9 PM and 10 PM.  He has tried over-the-counter cold p.m. cold medications and states this helps him stay asleep at night.  He does mention that he drinks tea at night prior to heading to bed.  He states he has not had any reports of him snoring by his friends or family members and denies any morning headaches.  His insomnia is most likely due to poor sleep hygiene.  Plan: - Advised on sleep hygiene - Trial of melatonin at night.

## 2018-12-24 NOTE — Patient Instructions (Signed)
Jared Shea,   It was a pleasure taking care of you here today.  Overall it looks that you are doing very well taking care of yourself.  Your blood pressure is looking good today and I would advise that you continue taking your blood pressure medicine.  In regards to your dizziness, is looking like it is due to dehydration and I would encourage you to keep up with drinking a lot of water as you work outside most of the time.  With the trouble sleeping, you can try over-the-counter melatonin and also avoid drinking tea prior to going to bed.  Once you are able to get your Medicaid card, I will refer you to get a colonoscopy.  Take Care! Dr. Eileen Stanford  Please call the internal medicine center clinic if you have any questions or concerns, we may be able to help and keep you from a long and expensive emergency room wait. Our clinic and after hours phone number is [phone], the best time to call is Monday through Friday 9 am to 4 pm but there is always someone available 24/7 if you have an emergency. If you need medication refills please notify your pharmacy one week in advance and they will send [place] a request.

## 2018-12-25 NOTE — Progress Notes (Signed)
Internal Medicine Clinic Attending  Case discussed with Dr. Agyei at the time of the visit.  We reviewed the resident's history and exam and pertinent patient test results.  I agree with the assessment, diagnosis, and plan of care documented in the resident's note.    

## 2018-12-26 ENCOUNTER — Other Ambulatory Visit: Payer: Self-pay

## 2018-12-26 ENCOUNTER — Emergency Department (HOSPITAL_COMMUNITY)
Admission: EM | Admit: 2018-12-26 | Discharge: 2018-12-27 | Disposition: A | Discharge status: Home / Self Care | Payer: Medicare Other | Attending: Emergency Medicine | Admitting: Emergency Medicine

## 2018-12-26 ENCOUNTER — Encounter (HOSPITAL_COMMUNITY): Payer: Self-pay | Admitting: Emergency Medicine

## 2018-12-26 DIAGNOSIS — R1084 Generalized abdominal pain: Secondary | ICD-10-CM | POA: Diagnosis not present

## 2018-12-26 DIAGNOSIS — I10 Essential (primary) hypertension: Secondary | ICD-10-CM | POA: Insufficient documentation

## 2018-12-26 DIAGNOSIS — Z7982 Long term (current) use of aspirin: Secondary | ICD-10-CM | POA: Insufficient documentation

## 2018-12-26 DIAGNOSIS — F17210 Nicotine dependence, cigarettes, uncomplicated: Secondary | ICD-10-CM | POA: Insufficient documentation

## 2018-12-26 DIAGNOSIS — R42 Dizziness and giddiness: Secondary | ICD-10-CM | POA: Insufficient documentation

## 2018-12-26 LAB — COMPREHENSIVE METABOLIC PANEL
ALT: 35 U/L (ref 0–44)
AST: 32 U/L (ref 15–41)
Albumin: 4.2 g/dL (ref 3.5–5.0)
Alkaline Phosphatase: 70 U/L (ref 38–126)
Anion gap: 9 (ref 5–15)
BUN: 17 mg/dL (ref 8–23)
CO2: 24 mmol/L (ref 22–32)
Calcium: 9.3 mg/dL (ref 8.9–10.3)
Chloride: 107 mmol/L (ref 98–111)
Creatinine, Ser: 0.82 mg/dL (ref 0.61–1.24)
GFR calc Af Amer: >60 mL/min (ref >60)
GFR calc non Af Amer: >60 mL/min (ref >60)
Glucose, Bld: 95 mg/dL (ref 70–99)
Potassium: 4.3 mmol/L (ref 3.5–5.1)
Sodium: 140 mmol/L (ref 135–145)
Total Bilirubin: 0.6 mg/dL (ref 0.3–1.2)
Total Protein: 7.4 g/dL (ref 6.5–8.1)

## 2018-12-26 LAB — URINALYSIS, ROUTINE W REFLEX MICROSCOPIC
Bilirubin Urine: NEGATIVE
Glucose, UA: NEGATIVE mg/dL
Hgb urine dipstick: NEGATIVE
Ketones, ur: NEGATIVE mg/dL
Leukocytes,Ua: NEGATIVE
Nitrite: NEGATIVE
Protein, ur: NEGATIVE mg/dL
Specific Gravity, Urine: 1.028 (ref 1.005–1.030)
pH: 5 (ref 5.0–8.0)

## 2018-12-26 LAB — CBC
HCT: 41.4 % (ref 39.0–52.0)
Hemoglobin: 14.3 g/dL (ref 13.0–17.0)
MCH: 31.4 pg (ref 26.0–34.0)
MCHC: 34.5 g/dL (ref 30.0–36.0)
MCV: 90.8 fL (ref 80.0–100.0)
Platelets: 423 10*3/uL — ABNORMAL HIGH (ref 150–400)
RBC: 4.56 MIL/uL (ref 4.22–5.81)
RDW: 13.2 % (ref 11.5–15.5)
WBC: 8.2 10*3/uL (ref 4.0–10.5)
nRBC: 0 % (ref 0.0–0.2)

## 2018-12-26 LAB — LIPASE, BLOOD: Lipase: 49 U/L (ref 11–51)

## 2018-12-26 MED ORDER — SODIUM CHLORIDE 0.9% FLUSH: 3.0000 mL | Freq: Once | INTRAVENOUS | Status: DC

## 2018-12-26 NOTE — ED Notes (Signed)
Pt given specimen cup. 

## 2018-12-26 NOTE — ED Triage Notes (Signed)
Patient reports intermittent generalized abdominal pain with diarrhea x1 onset last week , denies emesis , pt. added fatigue and mild headache , denies fever or chills.

## 2018-12-27 ENCOUNTER — Emergency Department (HOSPITAL_COMMUNITY): Payer: Medicare Other

## 2018-12-27 MED ORDER — MECLIZINE HCL 25 MG PO TABS
25.0000 mg | ORAL_TABLET | Freq: Once | ORAL | Status: AC
  Administered 2018-12-27: 25 mg via ORAL
  Filled 2018-12-27: qty 1

## 2018-12-27 MED ORDER — MECLIZINE HCL 12.5 MG PO TABS: 12.5000 mg | ORAL_TABLET | Freq: Three times a day (TID) | ORAL | 0 refills | Status: AC | PRN

## 2018-12-27 NOTE — ED Notes (Signed)
Pt verbalized understanding of discharge paperwork, follow-up care and prescriptions.

## 2018-12-27 NOTE — Discharge Instructions (Addendum)
Take Meclizine as needed as prescribed for dizziness. Follow up with Neurology for dizziness. Recheck with your doctor. Return to ER for new or worsening symptoms.

## 2018-12-27 NOTE — ED Provider Notes (Signed)
[facility] EMERGENCY DEPARTMENT Provider Note   CSN: [id number] Arrival date & time: 12/26/18  2016    History   Chief Complaint Chief Complaint  Patient presents with  . Abdominal Pain    HPI Jared Shea is a 63 y.o. male.     63 year old male presents with complaint of dizziness.  Patient states he began to feel dizzy on Monday (5 days ago), associated with diarrhea described as nonbloody loose stools.  Dizziness occurs with changes in position, not associated with nausea, vomiting, headaches.  Patient reports onset of abdominal pain on Wednesday (3 days ago). Patient reports feeling feverish yesterday- feeling hot in the car despite the [facility] being on. No known sick contacts. Patient came to the ER last night for abdominal pain, mid abdominal area, aching in nature, pain has since resolved in the ER lobby following a 12 hour wait. Patient is here with persistent dizziness at this point, worse with bending over. Denies CP, SHOB, changes in vision, speech, gait. No other complaints or concerns.      Past Medical History:  Diagnosis Date  . Bursitis of right hip   . Chest pain 08/12/2018  . Chronic cough   . Dependence on nicotine from cigarettes   . Essential hypertension 07/28/2018    Patient Active Problem List   Diagnosis Date Noted  . Dizziness 12/24/2018  . Insomnia 12/24/2018  . Healthcare maintenance 12/24/2018  . Tobacco abuse counseling 08/12/2018  . Essential hypertension 07/28/2018    Past Surgical History:  Procedure Laterality Date  . NO PAST SURGERIES          Home Medications    Prior to Admission medications   Medication Sig Start Date End Date Taking? Authorizing Provider  aspirin EC 325 MG tablet Take 325 mg by mouth daily.    [provider]  losartan-hydrochlorothiazide (HYZAAR) 50-12.5 MG tablet Take 1 tablet by mouth daily. 12/24/18   Jean Rosenthal, MD  meclizine (ANTIVERT) 12.5 MG tablet Take 1 tablet (12.5 mg  total) by mouth 3 (three) times daily as needed for up to 3 days for dizziness. 12/27/18 12/30/18  Tacy Learn, PA-C    Family History Family History  Problem Relation Age of Onset  . Heart disease Mother        CABG  . Kidney disease Sister   . Diabetes Brother   . Kidney disease Brother        KIDNEY TRANSPLANT  . Hypertension Sister   . Healthy Daughter   . Healthy Daughter   . Healthy Son     Social History Social History   Tobacco Use  . Smoking status: Current Every Day Smoker    Years: 48.00    Types: Cigarettes    Start date: 08/07/1970  . Smokeless tobacco: Never Used  Substance Use Topics  . Alcohol use: Yes  . Drug use: Never     Allergies   Penicillins   Review of Systems Review of Systems  Constitutional: Negative for fever.  Eyes: Negative for visual disturbance.  Respiratory: Negative for shortness of breath.   Cardiovascular: Negative for chest pain.  Gastrointestinal: Positive for abdominal pain. Negative for blood in stool, constipation, diarrhea, nausea and vomiting.  Genitourinary: Negative for dysuria, frequency and urgency.  Skin: Negative for rash and wound.  Allergic/Immunologic: Negative for immunocompromised state.  Neurological: Positive for dizziness. Negative for facial asymmetry, speech difficulty, weakness and numbness.  All other systems reviewed and are negative.  Physical Exam Updated Vital Signs BP 134/81   Pulse 62   Temp 98.2 F (36.8 C) (Oral)   Resp 20   SpO2 96%   Physical Exam Vitals signs and nursing note reviewed.  Constitutional:      General: He is not in acute distress.    Appearance: He is well-developed. He is not diaphoretic.  HENT:     Head: Normocephalic and atraumatic.  Cardiovascular:     Rate and Rhythm: Normal rate and regular rhythm.     Heart sounds: Normal heart sounds.  Pulmonary:     Effort: Pulmonary effort is normal.     Breath sounds: Normal breath sounds.  Abdominal:      Palpations: Abdomen is soft.     Tenderness: There is no abdominal tenderness.  Skin:    General: Skin is warm and dry.     Findings: No erythema or rash.  Neurological:     Mental Status: He is alert and oriented to person, place, and time.     GCS: GCS eye subscore is 4. GCS verbal subscore is 5. GCS motor subscore is 6.     Sensory: Sensation is intact.     Motor: Motor function is intact. No weakness or pronator drift.     Coordination: Coordination is intact.     Gait: Gait is intact.     Deep Tendon Reflexes: Reflexes are normal and symmetric.  Psychiatric:        Behavior: Behavior normal.      ED Treatments / Results  Labs (all labs ordered are listed, but only abnormal results are displayed) Labs Reviewed  CBC - Abnormal; Notable for the following components:      Result Value   Platelets 423 (*)    All other components within normal limits  LIPASE, BLOOD  COMPREHENSIVE METABOLIC PANEL  URINALYSIS, ROUTINE W REFLEX MICROSCOPIC    EKG None  Radiology Ct Head Wo Contrast  Result Date: 12/27/2018 CLINICAL DATA:  Vertigo, episodic, peripheral EXAM: CT HEAD WITHOUT CONTRAST TECHNIQUE: Contiguous axial images were obtained from the base of the skull through the vertex without intravenous contrast. COMPARISON:  02/05/2004 head CT report FINDINGS: Brain: No evidence of acute infarction, hemorrhage, hydrocephalus, extra-axial collection or mass lesion/mass effect. Vascular: No hyperdense vessel or unexpected calcification. Skull: Normal. Negative for fracture or focal lesion. Sinuses/Orbits: No acute finding. Mastoid and middle ear spaces are clear. IMPRESSION: Negative head CT. Electronically Signed   By: Monte Fantasia M.D.   On: 12/27/2018 09:12    Procedures Procedures (including critical care time)  Medications Ordered in ED Medications  meclizine (ANTIVERT) tablet 25 mg (25 mg Oral Given 12/27/18 0817)     Initial Impression / Assessment and Plan / ED Course   I have reviewed the triage vital signs and the nursing notes.  Pertinent labs & imaging results that were available during my care of the patient were reviewed by me and considered in my medical decision making (see chart for details).  Clinical Course as of Dec 26 1517  Sat Dec 27, 2018  0852 CT Head Wo Contrast [WF]  8221 63 year old male presents with complaint of abdominal pain and dizziness.  Patient states his abdominal pain has resolved while waiting in the emergency room however he continues to have dizziness x5 days.  Neuro exam is unremarkable.  Lab work including CBC, CMP, lipase and urinalysis without significant findings.  Vital signs are stable.  CT the head is unremarkable.  Patient was  given Antivert for his dizziness which has helped.  Patient referred to neurology for follow-up with prescription for Antivert.  Advised return to emergency room for worsening or concerning symptoms.   [LM]    Clinical Course User Index [LM] Tacy Learn, PA-C [WF] Jerel Shepherd      Final Clinical Impressions(s) / ED Diagnoses   Final diagnoses:  Dizziness  Generalized abdominal pain    ED Discharge Orders         Ordered    meclizine (ANTIVERT) 12.5 MG tablet  3 times daily PRN     12/27/18 0917           Tacy Learn, PA-C 12/27/18 1519    Gareth Morgan, MD 12/30/18 1606

## 2018-12-27 NOTE — ED Notes (Signed)
Patient transported to CT 

## 2019-02-06 ENCOUNTER — Other Ambulatory Visit: Payer: Self-pay

## 2019-02-06 ENCOUNTER — Encounter: Payer: Self-pay | Admitting: Internal Medicine

## 2019-02-06 ENCOUNTER — Ambulatory Visit (INDEPENDENT_AMBULATORY_CARE_PROVIDER_SITE_OTHER): Payer: Medicare Other | Admitting: Internal Medicine

## 2019-02-06 VITALS — BP 153/83 | HR 76 | Temp 98.0°F | Wt 187.0 lb

## 2019-02-06 DIAGNOSIS — B351 Tinea unguium: Secondary | ICD-10-CM | POA: Insufficient documentation

## 2019-02-06 DIAGNOSIS — F17200 Nicotine dependence, unspecified, uncomplicated: Secondary | ICD-10-CM | POA: Diagnosis not present

## 2019-02-06 MED ORDER — VARENICLINE TARTRATE 0.5 MG X 11 & 1 MG X 42 PO MISC: ORAL | 0 refills | Status: DC

## 2019-02-06 NOTE — Progress Notes (Signed)
CC: onchomycosis due to dermatophyte, Tobacco use disorder  HPI:  [name] is a 63 y.o. male with PMH below.  Today we will address onchomycosis due to dermatophyte, Tobacco use disorder  Please see A&P for status of the patient's chronic medical conditions  Past Medical History:  Diagnosis Date  . Bursitis of right hip   . Chest pain 08/12/2018  . Chronic cough   . Dependence on nicotine from cigarettes   . Essential hypertension 07/28/2018   Review of Systems:  ROS: Pulmonary: pt denies increased work of breathing, shortness of breath,  Cardiac: pt denies palpitations, chest pain,  Abdominal: pt denies abdominal pain, nausea, vomiting, or diarrhea   Physical Exam:  Vitals:   02/06/19 0934  BP: (!) 153/83  Pulse: 76  SpO2: 100%  Weight: 187 lb (84.8 kg)   Cardiac: JVD flat, normal rate and rhythm, clear s1 and s2, no murmurs, rubs or gallops, no LE edema Pulmonary: CTAB, not in distress Abdominal: non distended abdomen, soft and nontender Psych: Alert, conversant, in good spirits Right Foot: dermatophyte infection present in toe nails resulting in brittle twisted nails, pain in palpation of second toenail.  Second toenail appears to be infiltrating into the skin of the second toe.  Media Information    Document Information  Photos    02/06/2019 09:40  Attached To:  Office Visit on 02/06/19 with Jared Roan, MD  Source Information  Jared Shea, Jared Pane, MD  [facility]     Social History   Socioeconomic History  . Marital status: Divorced    Spouse name: Not on file  . Number of children: Not on file  . Years of education: Not on file  . Highest education level: Not on file  Occupational History  . Not on file  Social Needs  . Financial resource strain: Not on file  . Food insecurity    Worry: Not on file    Inability: Not on file  . Transportation needs    Medical: Not on file    Non-medical: Not on file  Tobacco Use  .  Smoking status: Current Every Day Smoker    Years: 48.00    Types: Cigarettes    Start date: 08/07/1970  . Smokeless tobacco: Never Used  Substance and Sexual Activity  . Alcohol use: Yes  . Drug use: Never  . Sexual activity: Not on file    Comment: YES  Lifestyle  . Physical activity    Days per week: Not on file    Minutes per session: Not on file  . Stress: Not on file  Relationships  . Social Herbalist on phone: Not on file    Gets together: Not on file    Attends religious service: Not on file    Active member of club or organization: Not on file    Attends meetings of clubs or organizations: Not on file    Relationship status: Not on file  . Intimate partner violence    Fear of current or ex partner: Not on file    Emotionally abused: Not on file    Physically abused: Not on file    Forced sexual activity: Not on file  Other Topics Concern  . Not on file  Social History Narrative  . Not on file    Family History  Problem Relation Age of Onset  . Heart disease Mother        CABG  .  Kidney disease Sister   . Diabetes Brother   . Kidney disease Brother        KIDNEY TRANSPLANT  . Hypertension Sister   . Healthy Daughter   . Healthy Daughter   . Healthy Son     Assessment & Plan:   See Encounters Tab for problem based charting.  Patient discussed with Dr. Evette Doffing

## 2019-02-06 NOTE — Patient Instructions (Addendum)
[name] we have referred you to podiatry for your toenail.  Addiitionally I have written for a medication called chantix to help you quit smoking.  Varenicline oral tablets What is this medicine? VARENICLINE (var e NI kleen) is used to help people quit smoking. It is used with a patient support program recommended by your physician. This medicine may be used for other purposes; ask your health care provider or pharmacist if you have questions. COMMON BRAND NAME(S): Chantix What should I tell my health care provider before I take this medicine? They need to know if you have any of these conditions:  heart disease  if you often drink alcohol  kidney disease  mental illness  on hemodialysis  seizures  history of stroke  suicidal thoughts, plans, or attempt; a previous suicide attempt by you or a family member  an unusual or allergic reaction to varenicline, other medicines, foods, dyes, or preservatives  pregnant or trying to get pregnant  breast-feeding How should I use this medicine? Take this medicine by mouth after eating. Take with a full glass of water. Follow the directions on the prescription label. Take your doses at regular intervals. Do not take your medicine more often than directed. There are 3 ways you can use this medicine to help you quit smoking; talk to your health care professional to decide which plan is right for you: 1) you can choose a quit date and start this medicine 1 week before the quit date, or, 2) you can start taking this medicine before you choose a quit date, and then pick a quit date between day 8 and 35 days of treatment, or, 3) if you are not sure that you are able or willing to quit smoking right away, start taking this medicine and slowly decrease the amount you smoke as directed by your health care professional with the goal of being cigarette-free by week 12 of treatment. Stick to your plan; ask about support groups or other ways to help you  remain cigarette-free. If you are motivated to quit smoking and did not succeed during a previous attempt with this medicine for reasons other than side effects, or if you returned to smoking after this treatment, speak with your health care professional about whether another course of this medicine may be right for you. A special MedGuide will be given to you by the pharmacist with each prescription and refill. Be sure to read this information carefully each time. Talk to your pediatrician regarding the use of this medicine in children. This medicine is not approved for use in children. Overdosage: If you think you have taken too much of this medicine contact a poison control center or emergency room at once. NOTE: This medicine is only for you. Do not share this medicine with others. What if I miss a dose? If you miss a dose, take it as soon as you can. If it is almost time for your next dose, take only that dose. Do not take double or extra doses. What may interact with this medicine?  alcohol  insulin  other medicines used to help people quit smoking  theophylline  warfarin This list may not describe all possible interactions. Give your health care provider a list of all the medicines, herbs, non-prescription drugs, or dietary supplements you use. Also tell them if you smoke, drink alcohol, or use illegal drugs. Some items may interact with your medicine. What should I watch for while using this medicine? It is okay if  you do not succeed at your attempt to quit and have a cigarette. You can still continue your quit attempt and keep using this medicine as directed. Just throw away your cigarettes and get back to your quit plan. Talk to your health care provider before using other treatments to quit smoking. Using this medicine with other treatments to quit smoking may increase the risk for side effects compared to using a treatment alone. You may get drowsy or dizzy. Do not drive, use  machinery, or do anything that needs mental alertness until you know how this medicine affects you. Do not stand or sit up quickly, especially if you are an older patient. This reduces the risk of dizzy or fainting spells. Decrease the number of alcoholic beverages that you drink during treatment with this medicine until you know if this medicine affects your ability to tolerate alcohol. Some people have experienced increased drunkenness (intoxication), unusual or sometimes aggressive behavior, or no memory of things that have happened (amnesia) during treatment with this medicine. Sleepwalking can happen during treatment with this medicine, and can sometimes lead to behavior that is harmful to you, other people, or property. Stop taking this medicine and tell your doctor if you start sleepwalking or have other unusual sleep-related activity. After taking this medicine, you may get up out of bed and do an activity that you do not know you are doing. The next morning, you may have no memory of this. Activities include driving a car ("sleep-driving"), making and eating food, talking on the phone, sexual activity, and sleep-walking. Serious injuries have occurred. Stop the medicine and call your doctor right away if you find out you have done any of these activities. Do not take this medicine if you have used alcohol that evening. Do not take it if you have taken another medicine for sleep. The risk of doing these sleep-related activities is higher. Patients and their families should watch out for new or worsening depression or thoughts of suicide. Also watch out for sudden changes in feelings such as feeling anxious, agitated, panicky, irritable, hostile, aggressive, impulsive, severely restless, overly excited and hyperactive, or not being able to sleep. If this happens, call your health care professional. If you have diabetes and you quit smoking, the effects of insulin may be increased and you may need to  reduce your insulin dose. Check with your doctor or health care professional about how you should adjust your insulin dose. What side effects may I notice from receiving this medicine? Side effects that you should report to your doctor or health care professional as soon as possible:  allergic reactions like skin rash, itching or hives, swelling of the face, lips, tongue, or throat  acting aggressive, being angry or violent, or acting on dangerous impulses  breathing problems  changes in emotions or moods  chest pain or chest tightness  feeling faint or lightheaded, falls  hallucination, loss of contact with reality  mouth sores  redness, blistering, peeling or loosening of the skin, including inside the mouth  signs and symptoms of a stroke like changes in vision; confusion; trouble speaking or understanding; severe headaches; sudden numbness or weakness of the face, arm or leg; trouble walking; dizziness; loss of balance or coordination  seizures  sleepwalking  suicidal thoughts or other mood changes Side effects that usually do not require medical attention (report to your doctor or health care professional if they continue or are bothersome):  constipation  gas  headache  nausea, vomiting  strange dreams  trouble sleeping This list may not describe all possible side effects. Call your doctor for medical advice about side effects. You may report side effects to FDA at 1-800-FDA-1088. Where should I keep my medicine? Keep out of the reach of children. Store at room temperature between 15 and 30 degrees C (59 and 86 degrees F). Throw away any unused medicine after the expiration date. NOTE: This sheet is a summary. It may not cover all possible information. If you have questions about this medicine, talk to your doctor, pharmacist, or health care provider.  2020 Elsevier/Gold Standard (2018-04-18 14:27:36) Varenicline oral tablets What is this medicine? VARENICLINE  (var e NI kleen) is used to help people quit smoking. It is used with a patient support program recommended by your physician. This medicine may be used for other purposes; ask your health care provider or pharmacist if you have questions. COMMON BRAND NAME(S): Chantix What should I tell my health care provider before I take this medicine? They need to know if you have any of these conditions:  heart disease  if you often drink alcohol  kidney disease  mental illness  on hemodialysis  seizures  history of stroke  suicidal thoughts, plans, or attempt; a previous suicide attempt by you or a family member  an unusual or allergic reaction to varenicline, other medicines, foods, dyes, or preservatives  pregnant or trying to get pregnant  breast-feeding How should I use this medicine? Take this medicine by mouth after eating. Take with a full glass of water. Follow the directions on the prescription label. Take your doses at regular intervals. Do not take your medicine more often than directed. There are 3 ways you can use this medicine to help you quit smoking; talk to your health care professional to decide which plan is right for you: 1) you can choose a quit date and start this medicine 1 week before the quit date, or, 2) you can start taking this medicine before you choose a quit date, and then pick a quit date between day 8 and 35 days of treatment, or, 3) if you are not sure that you are able or willing to quit smoking right away, start taking this medicine and slowly decrease the amount you smoke as directed by your health care professional with the goal of being cigarette-free by week 12 of treatment. Stick to your plan; ask about support groups or other ways to help you remain cigarette-free. If you are motivated to quit smoking and did not succeed during a previous attempt with this medicine for reasons other than side effects, or if you returned to smoking after this treatment,  speak with your health care professional about whether another course of this medicine may be right for you. A special MedGuide will be given to you by the pharmacist with each prescription and refill. Be sure to read this information carefully each time. Talk to your pediatrician regarding the use of this medicine in children. This medicine is not approved for use in children. Overdosage: If you think you have taken too much of this medicine contact a poison control center or emergency room at once. NOTE: This medicine is only for you. Do not share this medicine with others. What if I miss a dose? If you miss a dose, take it as soon as you can. If it is almost time for your next dose, take only that dose. Do not take double or extra doses. What may interact with  this medicine?  alcohol  insulin  other medicines used to help people quit smoking  theophylline  warfarin This list may not describe all possible interactions. Give your health care provider a list of all the medicines, herbs, non-prescription drugs, or dietary supplements you use. Also tell them if you smoke, drink alcohol, or use illegal drugs. Some items may interact with your medicine. What should I watch for while using this medicine? It is okay if you do not succeed at your attempt to quit and have a cigarette. You can still continue your quit attempt and keep using this medicine as directed. Just throw away your cigarettes and get back to your quit plan. Talk to your health care provider before using other treatments to quit smoking. Using this medicine with other treatments to quit smoking may increase the risk for side effects compared to using a treatment alone. You may get drowsy or dizzy. Do not drive, use machinery, or do anything that needs mental alertness until you know how this medicine affects you. Do not stand or sit up quickly, especially if you are an older patient. This reduces the risk of dizzy or fainting  spells. Decrease the number of alcoholic beverages that you drink during treatment with this medicine until you know if this medicine affects your ability to tolerate alcohol. Some people have experienced increased drunkenness (intoxication), unusual or sometimes aggressive behavior, or no memory of things that have happened (amnesia) during treatment with this medicine. Sleepwalking can happen during treatment with this medicine, and can sometimes lead to behavior that is harmful to you, other people, or property. Stop taking this medicine and tell your doctor if you start sleepwalking or have other unusual sleep-related activity. After taking this medicine, you may get up out of bed and do an activity that you do not know you are doing. The next morning, you may have no memory of this. Activities include driving a car ("sleep-driving"), making and eating food, talking on the phone, sexual activity, and sleep-walking. Serious injuries have occurred. Stop the medicine and call your doctor right away if you find out you have done any of these activities. Do not take this medicine if you have used alcohol that evening. Do not take it if you have taken another medicine for sleep. The risk of doing these sleep-related activities is higher. Patients and their families should watch out for new or worsening depression or thoughts of suicide. Also watch out for sudden changes in feelings such as feeling anxious, agitated, panicky, irritable, hostile, aggressive, impulsive, severely restless, overly excited and hyperactive, or not being able to sleep. If this happens, call your health care professional. If you have diabetes and you quit smoking, the effects of insulin may be increased and you may need to reduce your insulin dose. Check with your doctor or health care professional about how you should adjust your insulin dose. What side effects may I notice from receiving this medicine? Side effects that you should  report to your doctor or health care professional as soon as possible:  allergic reactions like skin rash, itching or hives, swelling of the face, lips, tongue, or throat  acting aggressive, being angry or violent, or acting on dangerous impulses  breathing problems  changes in emotions or moods  chest pain or chest tightness  feeling faint or lightheaded, falls  hallucination, loss of contact with reality  mouth sores  redness, blistering, peeling or loosening of the skin, including inside the mouth  signs  and symptoms of a stroke like changes in vision; confusion; trouble speaking or understanding; severe headaches; sudden numbness or weakness of the face, arm or leg; trouble walking; dizziness; loss of balance or coordination  seizures  sleepwalking  suicidal thoughts or other mood changes Side effects that usually do not require medical attention (report to your doctor or health care professional if they continue or are bothersome):  constipation  gas  headache  nausea, vomiting  strange dreams  trouble sleeping This list may not describe all possible side effects. Call your doctor for medical advice about side effects. You may report side effects to FDA at 1-800-FDA-1088. Where should I keep my medicine? Keep out of the reach of children. Store at room temperature between 15 and 30 degrees C (59 and 86 degrees F). Throw away any unused medicine after the expiration date. NOTE: This sheet is a summary. It may not cover all possible information. If you have questions about this medicine, talk to your doctor, pharmacist, or health care provider.  2020 Elsevier/Gold Standard (2018-04-18 14:27:36)

## 2019-02-06 NOTE — Progress Notes (Signed)
[facility] Attending  Case discussed with Dr. Winfrey  at the time of the visit.  We reviewed the resident's history and exam and pertinent patient test results.  I agree with the assessment, diagnosis, and plan of care documented in the resident's note.  

## 2019-02-06 NOTE — Assessment & Plan Note (Signed)
This has resulted in twisting of the brittle nails.  The second toenail now twisting into the skin and causing pain.    -referral to podiatry for nail care with likely removal

## 2019-02-06 NOTE — Assessment & Plan Note (Addendum)
Smoking since 1972 at about a pack per day currently.  I discussed the benefits of smoking cessation and he was very interested in quitting.  He has not had success with the patch in the past and has had trouble quitting on his own.  Went over possible side effects of chantix with patient today and gave him a handout about the medicine.    -chantix dose pack -he will follow up upon completion of dose pack

## 2019-02-11 ENCOUNTER — Ambulatory Visit (INDEPENDENT_AMBULATORY_CARE_PROVIDER_SITE_OTHER): Payer: Medicare Other | Admitting: Podiatry

## 2019-02-11 ENCOUNTER — Encounter: Payer: Self-pay | Admitting: Podiatry

## 2019-02-11 ENCOUNTER — Other Ambulatory Visit: Payer: Self-pay

## 2019-02-11 VITALS — BP 135/83 | HR 70 | Resp 16

## 2019-02-11 DIAGNOSIS — M79676 Pain in unspecified toe(s): Secondary | ICD-10-CM | POA: Diagnosis not present

## 2019-02-11 DIAGNOSIS — L600 Ingrowing nail: Secondary | ICD-10-CM | POA: Diagnosis not present

## 2019-02-11 DIAGNOSIS — M79609 Pain in unspecified limb: Secondary | ICD-10-CM

## 2019-02-11 DIAGNOSIS — M79671 Pain in right foot: Secondary | ICD-10-CM

## 2019-02-11 DIAGNOSIS — B351 Tinea unguium: Secondary | ICD-10-CM

## 2019-02-11 NOTE — Patient Instructions (Signed)

## 2019-02-11 NOTE — Progress Notes (Signed)
Subjective:  Patient ID: Jared Shea., male    DOB: Dec 13, 1955,  MRN: [id number]  Chief Complaint  Patient presents with  . Nail Problem    Bilateral; all nails; nail discoloration & thickened nails; x-3-4 yrs  . Nail Problem    Right foot; 2nd toe-medial side; x3-4 months; pt stated, "I think I have an ingrown toenail"  . Debridement    Bilateral nail trim    63 y.o. male presents with the above complaint.  Patient presents with a complaint of right second toe nail pain.  He states that every time he walks and wears the shoes the pain does not.  He states the thickness of the nail and the pressure associated with it has made it worse over time.  He is also has a secondary complaint of all the fungal toenails painful in nature.  He denies doing any kind of topical medication to eradicate the fungal toenails.   Review of Systems: Negative except as noted in the HPI. Denies N/V/F/Ch.  Past Medical History:  Diagnosis Date  . Bursitis of right hip   . Chest pain 08/12/2018  . Chronic cough   . Dependence on nicotine from cigarettes   . Essential hypertension 07/28/2018    Current Outpatient Medications:  .  aspirin EC 325 MG tablet, Take 325 mg by mouth daily., Disp: , Rfl:  .  losartan-hydrochlorothiazide (HYZAAR) 50-12.5 MG tablet, Take 1 tablet by mouth daily., Disp: 30 tablet, Rfl: 5 .  varenicline (CHANTIX PAK) 0.5 MG X 11 & 1 MG X 42 tablet, Take one 0.5 mg tablet by mouth once daily for 3 days, then increase to one 0.5 mg tablet twice daily for 4 days, then increase to one 1 mg tablet twice daily., Disp: 53 tablet, Rfl: 0  Social History   Tobacco Use  Smoking Status Current Every Day Smoker  . Years: 48.00  . Types: Cigarettes  . Start date: 08/07/1970  Smokeless Tobacco Never Used    Allergies  Allergen Reactions  . Penicillins    Objective:   Vitals:   02/11/19 0844  BP: 135/83  Pulse: 70  Resp: 16   There is no height or weight on file to calculate BMI.  Constitutional Well developed. Well nourished.  Vascular Dorsalis pedis pulses palpable bilaterally. Posterior tibial pulses palpable bilaterally. Capillary refill normal to all digits.  No cyanosis or clubbing noted. Pedal hair growth normal.  Neurologic Normal speech. Oriented to person, place, and time. Epicritic sensation to light touch grossly present bilaterally.  Dermatologic Painful ingrowing nail at Total nail borders nail borders of the hallux nail right. No other open wounds. No skin lesions.  Orthopedic: Normal joint ROM without pain or crepitus bilaterally. No visible deformities. No bony tenderness.   Radiographs: None Assessment:   1. Ingrown toenail of right foot   2. Pain in right foot   3. Pain due to onychomycosis of nail    Plan:  Patient was evaluated and treated and all questions answered.  Ingrown Nail total of the right second digit -Patient elects to proceed with minor surgery to remove ingrown toenail removal today. Consent reviewed and signed by patient. -Ingrown nail excised. See procedure note. -Educated on post-procedure care including soaking. Written instructions provided and reviewed. -Patient to follow up in 2 weeks for nail check.   Onychomycosis with pain  -Nails palliatively debrided as below. -Educated on self-care  Procedure: Nail Debridement Rationale: pain  Type of Debridement: manual, sharp debridement. Instrumentation: Nail  nipper, rotary burr. Number of Nails: 10   Procedure: Excision of Ingrown total Toenail Location: Right 1st toe total nail borders. Anesthesia: Lidocaine 1% plain; 1.5 mL and Marcaine 0.5% plain; 1.5 mL, digital block. Skin Prep: Betadine. Dressing: Silvadene; telfa; dry, sterile, compression dressing. Technique: Following skin prep, the toe was exsanguinated and a tourniquet was secured at the base of the toe. The affected nail border was freed, split with a nail splitter, and excised. Chemical  matrixectomy was then performed with phenol and irrigated out with alcohol. The tourniquet was then removed and sterile dressing applied. Disposition: Patient tolerated procedure well. Patient to return in 2 weeks for follow-up.   Return in about 2 weeks (around 02/25/2019).

## 2019-02-11 NOTE — Progress Notes (Signed)
   Subjective:    Patient ID: Jared Simmonds., male    DOB: 05/28/1955, 63 y.o.   MRN: [id number]  HPI    Review of Systems  All other systems reviewed and are negative.      Objective:   Physical Exam        Assessment & Plan:

## 2019-02-25 ENCOUNTER — Encounter: Payer: Self-pay | Admitting: Podiatry

## 2019-02-25 ENCOUNTER — Other Ambulatory Visit: Payer: Self-pay

## 2019-02-25 ENCOUNTER — Ambulatory Visit (INDEPENDENT_AMBULATORY_CARE_PROVIDER_SITE_OTHER): Payer: Medicare Other | Admitting: Podiatry

## 2019-02-25 DIAGNOSIS — L600 Ingrowing nail: Secondary | ICD-10-CM

## 2019-02-25 DIAGNOSIS — M79671 Pain in right foot: Secondary | ICD-10-CM

## 2019-02-25 NOTE — Progress Notes (Signed)
Subjective: Jared Shea. is a 63 y.o.  male returns to office today for follow up evaluation after having Right second total nail avulsion performed. Patient has been soaking using epsom and applying topical antibiotic covered with bandaid daily. Patient denies fevers, chills, nausea, vomiting. Denies any calf pain, chest pain, SOB.   Objective:  Vitals: Reviewed  General: Well developed, nourished, in no acute distress, alert and oriented x3   Dermatology: Skin is warm, dry and supple bilateral. Total second digit nail border appears to be clean, dry, with mild granular tissue and surrounding scab. There is no surrounding erythema, edema, drainage/purulence. The remaining nails appear unremarkable at this time. There are no other lesions or other signs of infection present.  Neurovascular status: Intact. No lower extremity swelling; No pain with calf compression bilateral.  Musculoskeletal: Decreased tenderness to palpation of the total  Second nail fold(s). Muscular strength within normal limits bilateral.   Assesement and Plan: S/p partial nail avulsion, doing well.   -Continue soaking in epsom salts twice a day followed by antibiotic ointment and a band-aid. Can leave uncovered at night. Continue this until completely healed.  -Patient states that he was applying hydrogen peroxide to the wound. I told him stop it immediately and apply triple abx and bandaid. He had some periwound green discoloration. No other signs of infection. -If the area has not healed in 2 weeks, call the office for follow-up appointment, or sooner if any problems arise.  -Monitor for any signs/symptoms of infection. Call the office immediately if any occur or go directly to the emergency room. Call with any questions/concerns.  Boneta Lucks, DPM

## 2019-03-04 ENCOUNTER — Encounter: Payer: Self-pay | Admitting: Internal Medicine

## 2019-03-04 ENCOUNTER — Encounter: Payer: Medicare Other | Admitting: Internal Medicine

## 2019-03-06 ENCOUNTER — Other Ambulatory Visit: Payer: Self-pay | Admitting: Internal Medicine

## 2019-03-23 ENCOUNTER — Ambulatory Visit (INDEPENDENT_AMBULATORY_CARE_PROVIDER_SITE_OTHER): Payer: Medicare Other | Admitting: Internal Medicine

## 2019-03-23 ENCOUNTER — Other Ambulatory Visit: Payer: Self-pay

## 2019-03-23 VITALS — BP 116/75 | HR 95 | Temp 98.1°F | Ht 73.0 in | Wt 180.9 lb

## 2019-03-23 DIAGNOSIS — R0602 Shortness of breath: Secondary | ICD-10-CM

## 2019-03-23 DIAGNOSIS — Z720 Tobacco use: Secondary | ICD-10-CM | POA: Diagnosis not present

## 2019-03-23 DIAGNOSIS — R509 Fever, unspecified: Secondary | ICD-10-CM

## 2019-03-23 DIAGNOSIS — I10 Essential (primary) hypertension: Secondary | ICD-10-CM | POA: Diagnosis not present

## 2019-03-23 DIAGNOSIS — B349 Viral infection, unspecified: Secondary | ICD-10-CM

## 2019-03-23 HISTORY — DX: Viral infection, unspecified: B34.9

## 2019-03-23 NOTE — Progress Notes (Signed)
   CC: Chills  HPI: [name] is a 63 y.o. M w/ PMh of tobacco use, HTN who presents with complaints of fevers and chills. He mentions that on Friday he woke up with fevers, chills and intermittent dyspnea. He does not recall any sick contact. He mentions staying indoors. Mentions symptoms resolved over the weekend and today he has no significant symptoms but 'wanted to get checked out.''  Past Medical History:  Diagnosis Date  . Bursitis of right hip   . Chest pain 08/12/2018  . Chronic cough   . Dependence on nicotine from cigarettes   . Essential hypertension 07/28/2018    Review of Systems: Review of Systems  Constitutional: Positive for chills and fever. Negative for malaise/fatigue.  Eyes: Negative for blurred vision.  Respiratory: Positive for shortness of breath. Negative for cough.   Cardiovascular: Negative for chest pain, palpitations and leg swelling.  Gastrointestinal: Negative for constipation, diarrhea, nausea and vomiting.  All other systems reviewed and are negative.    Physical Exam: Vitals:   03/23/19 0915  BP: 116/75  Pulse: 95  Temp: 98.1 F (36.7 C)  TempSrc: Oral  SpO2: 100%  Weight: 180 lb 14.4 oz (82.1 kg)  Height: 6\' 1"  (1.854 m)    Physical Exam  Constitutional: He appears well-developed and well-nourished. No distress.  HENT:  Mouth/Throat: Oropharynx is clear and moist.  Eyes: Conjunctivae are normal.  Neck: Normal range of motion. Neck supple.  Cardiovascular: Normal rate, regular rhythm, normal heart sounds and intact distal pulses.  No murmur heard. Respiratory: Effort normal and breath sounds normal. No respiratory distress. He has no wheezes. He has no rales.  GI: Soft. Bowel sounds are normal. He exhibits no distension. There is no abdominal tenderness.  Musculoskeletal: Normal range of motion.        General: No edema.  Lymphadenopathy:    He has no cervical adenopathy.     Assessment & Plan:   Viral illness Presents  with quickly self-resolving fevers, chills, dyspnea. Mentions onset of symptoms on Friday w/ no obvious etiology. No known sick contact. Symptoms resolved mostly by Sunday. No current symptoms. Satting 99% on RA. On exam, lungs CTAB. Will get COVID testing to guide quarantine indication.  - COVID test today - Information on self-quarantine provided    Patient discussed with Dr. Daryll Drown  -Gilberto Better, PGY2 [facility] Pager: [phone]

## 2019-03-23 NOTE — Patient Instructions (Signed)
Thank you for allowing [place] to provide your care today. Today we discussed your fevers and chills    I have ordered COVID labs for you. I will call if any are abnormal.    Today we made no changes to your medications.    Please follow-up if your symptoms worsen.    Should you have any questions or concerns please call the internal medicine clinic at [phone].    This information is directly available on the CDC website: RunningShows.co.za.html    Source:CDC Reference to specific commercial products, manufacturers, companies, or trademarks does not constitute its endorsement or recommendation by the [place], [place], or Centers for [employer] and [employer].

## 2019-03-23 NOTE — Assessment & Plan Note (Signed)
Presents with quickly self-resolving fevers, chills, dyspnea. Mentions onset of symptoms on Friday w/ no obvious etiology. No known sick contact. Symptoms resolved mostly by Sunday. No current symptoms. Satting 99% on RA. On exam, lungs CTAB. Will get COVID testing to guide quarantine indication.  - COVID test today - Information on self-quarantine provided

## 2019-03-24 ENCOUNTER — Telehealth: Payer: Self-pay

## 2019-03-24 NOTE — Telephone Encounter (Signed)
Test result still pending. Will give call back when finalized

## 2019-03-24 NOTE — Telephone Encounter (Signed)
Requesting test results. Please call pt back.  

## 2019-03-25 LAB — NOVEL CORONAVIRUS, NAA: SARS-CoV-2, NAA: NOT DETECTED

## 2019-03-25 NOTE — Progress Notes (Signed)
Internal Medicine Clinic Attending ° °Case discussed with Dr. Lee at the time of the visit.  We reviewed the resident’s history and exam and pertinent patient test results.  I agree with the assessment, diagnosis, and plan of care documented in the resident’s note.  °

## 2019-03-25 NOTE — Telephone Encounter (Signed)
Requesting lab results. Please call pt back.  

## 2019-03-26 NOTE — Telephone Encounter (Signed)
Called and spoke with Jared Shea regarding his negative COVID results

## 2019-04-01 ENCOUNTER — Encounter: Payer: Medicare Other | Admitting: Internal Medicine

## 2019-04-29 ENCOUNTER — Encounter: Payer: Medicare Other | Admitting: Internal Medicine

## 2019-05-20 ENCOUNTER — Encounter: Payer: Medicare Other | Admitting: Internal Medicine

## 2019-05-27 ENCOUNTER — Encounter: Payer: Medicare Other | Admitting: Internal Medicine

## 2019-06-10 ENCOUNTER — Ambulatory Visit (INDEPENDENT_AMBULATORY_CARE_PROVIDER_SITE_OTHER): Payer: Medicare Other | Admitting: Internal Medicine

## 2019-06-10 ENCOUNTER — Encounter: Payer: Self-pay | Admitting: Internal Medicine

## 2019-06-10 ENCOUNTER — Other Ambulatory Visit: Payer: Self-pay

## 2019-06-10 VITALS — BP 102/57 | HR 81 | Temp 98.8°F | Wt 179.7 lb

## 2019-06-10 DIAGNOSIS — F17210 Nicotine dependence, cigarettes, uncomplicated: Secondary | ICD-10-CM

## 2019-06-10 DIAGNOSIS — Z79899 Other long term (current) drug therapy: Secondary | ICD-10-CM | POA: Diagnosis not present

## 2019-06-10 DIAGNOSIS — F17200 Nicotine dependence, unspecified, uncomplicated: Secondary | ICD-10-CM

## 2019-06-10 DIAGNOSIS — I10 Essential (primary) hypertension: Secondary | ICD-10-CM

## 2019-06-10 DIAGNOSIS — R42 Dizziness and giddiness: Secondary | ICD-10-CM

## 2019-06-10 MED ORDER — VARENICLINE TARTRATE 0.5 MG X 11 & 1 MG X 42 PO MISC: ORAL | 0 refills | Status: DC

## 2019-06-10 NOTE — Patient Instructions (Signed)
Mr. Dorance Spink,   Thanks for coming in to see me. For your dizziness. I am referring you for a special rehab which I believe will help you.

## 2019-06-10 NOTE — Progress Notes (Signed)
   CC: Dizziness, follow-up hypertension  HPI:  Jared Shea. is a 64 y.o. very pleasant African-American gentleman with medical history listed below presented to follow-up on hypertension and chronic dizziness  Please see problem based charting for further details.  Past Medical History:  Diagnosis Date  . Bursitis of right hip   . Chest pain 08/12/2018  . Chronic cough   . Dependence on nicotine from cigarettes   . Essential hypertension 07/28/2018   Review of Systems:  As per HPI  Physical Exam:  Vitals:   06/10/19 1501  BP: (!) 102/57  Pulse: 81  Temp: 98.8 F (37.1 C)  TempSrc: Oral  SpO2: 98%   Physical Exam  Constitutional: He is well-developed, well-nourished, and in no distress.  HENT:  Head: Normocephalic and atraumatic.  Cardiovascular: Normal rate, regular rhythm and normal heart sounds.  Pulmonary/Chest: Effort normal and breath sounds normal.  Neurological: He is alert.  An Epley maneuver was attempted in the clinic and could not definitively rule out BPPV.  Psychiatric: Mood and affect normal.    Assessment & Plan:   See Encounters Tab for problem based charting.  Patient discussed with Dr. Dareen Piano

## 2019-06-10 NOTE — Assessment & Plan Note (Signed)
Dizziness: Jared Shea has > 1 year history of dizziness that has required several visits to the clinic as well as the emergency department.  He describes his dizzy episodes as "the room spinning.  "Sometimes, it occurs when he bends over.  He denies headaches, nausea, vomiting, tinnitus, hearing loss.  During his last ED visit in August 2020, CT head was performed which was unremarkable.  He was started on meclizine though unsure if he has been taking the medication but he tells me that he did have a symptom-free period for about 3 months.  Usually it occurs about twice a month.  He has been doing well also today when he had another dizzy episode at work.  He denies syncope or loss of consciousness.  Sometimes, he would feel warm over his body parts release dizzy episodes.  Blood pressure reading in the clinic today was initially low at 107/57.  Orthostatic vitals were obtained which was unremarkable  Supine: 106/64, pulse 72 Setting: 104/67, pulse 74 Standing: 94/63, pulse 87  An Epley maneuver was attempted in the clinic and could not definitively rule out BPPV.  Differential diagnosis includes BPPV versus component of orthostasis  Plan: -Patient referred to vestibular rehab -Advised patient to hold antihypertensive for 2 to 3 days and increase oral intake

## 2019-06-10 NOTE — Assessment & Plan Note (Signed)
Hypertension: Well-controlled though BP soft today in clinic  BP Readings from Last 3 Encounters:  06/10/19 (!) 102/57  03/23/19 116/75  02/11/19 135/83   Plan: -Advised patient to hold losartan-HCTZ 50-12.5 mg daily for 2 to 3 days then increase p.o. intake

## 2019-06-11 NOTE — Progress Notes (Signed)
Internal Medicine Clinic Attending  Case discussed with Dr. Agyei at the time of the visit.  We reviewed the resident's history and exam and pertinent patient test results.  I agree with the assessment, diagnosis, and plan of care documented in the resident's note.    

## 2019-07-30 ENCOUNTER — Ambulatory Visit (INDEPENDENT_AMBULATORY_CARE_PROVIDER_SITE_OTHER): Payer: Medicare Other | Admitting: Internal Medicine

## 2019-07-30 VITALS — BP 130/77 | HR 75 | Temp 98.1°F | Ht 73.0 in | Wt 185.9 lb

## 2019-07-30 DIAGNOSIS — R109 Unspecified abdominal pain: Secondary | ICD-10-CM

## 2019-07-30 DIAGNOSIS — N390 Urinary tract infection, site not specified: Secondary | ICD-10-CM

## 2019-07-30 MED ORDER — CIPROFLOXACIN HCL 500 MG PO TABS: 500.0000 mg | ORAL_TABLET | Freq: Two times a day (BID) | ORAL | 0 refills | Status: AC

## 2019-07-30 NOTE — Progress Notes (Signed)
   CC: left back pain  HPI: Patient is a 64 year old male with past medical history as below who presents for evaluation of left back pain, concern for kidney infection.  [name] is a 64 y.o.   Past Medical History:  Diagnosis Date  . Bursitis of right hip   . Chest pain 08/12/2018  . Chronic cough   . Dependence on nicotine from cigarettes   . Essential hypertension 07/28/2018  . Viral illness 03/23/2019   Review of Systems:   Review of Systems  Constitutional: Negative for chills and fever.  Respiratory: Negative for shortness of breath.   Cardiovascular: Negative for chest pain.  Gastrointestinal: Negative for abdominal pain, nausea and vomiting.  Genitourinary: Positive for frequency and urgency. Negative for dysuria.  All other systems reviewed and are negative.    Physical Exam:  Vitals:   07/30/19 1309  BP: 130/77  Pulse: 75  Temp: 98.1 F (36.7 C)  TempSrc: Oral  SpO2: 98%  Weight: 185 lb 14.4 oz (84.3 kg)  Height: 6\' 1"  (1.854 m)   Physical Exam  Constitutional: He is well-developed, well-nourished, and in no distress.  HENT:  Head: Normocephalic and atraumatic.  Eyes: EOM are normal. Right eye exhibits no discharge. Left eye exhibits no discharge.  Neck: No tracheal deviation present.  Cardiovascular: Normal rate and regular rhythm. Exam reveals no gallop and no friction rub.  No murmur heard. Pulmonary/Chest: Effort normal and breath sounds normal. No respiratory distress. He has no wheezes. He has no rales.  Abdominal: Soft. He exhibits no distension. There is no abdominal tenderness. There is no rebound and no guarding.  Musculoskeletal:        General: Tenderness (Left CVA tenderness present) present. No deformity or edema. Normal range of motion.     Cervical back: Normal range of motion.  Neurological: He is alert. Coordination normal.  Skin: Skin is warm and dry. No rash noted. He is not diaphoretic. No erythema.  Psychiatric: Memory and  judgment normal.   BMP Latest Ref Rng & Units 12/26/2018 10/02/2018 06/23/2018  Glucose 70 - 99 mg/dL 95 87 113(H)  BUN 8 - 23 mg/dL 17 16 15   Creatinine 0.61 - 1.24 mg/dL 0.82 0.78 0.80  BUN/Creat Ratio 10 - 24 - 21 -  Sodium 135 - 145 mmol/L 140 138 140  Potassium 3.5 - 5.1 mmol/L 4.3 4.6 4.3  Chloride 98 - 111 mmol/L 107 100 108  CO2 22 - 32 mmol/L 24 20 23   Calcium 8.9 - 10.3 mg/dL 9.3 9.6 9.2   Patient has consented for results of studies to be left on answering machine for patient number(s) listed in chart  Assessment & Plan:   See Encounters Tab for problem based charting.  Patient discussed with Dr. Daryll Drown

## 2019-07-30 NOTE — Patient Instructions (Addendum)
You were seen for evaluation of your left back pain.  This does sound consistent with a urinary tract infection.  Here are my recommendations:  1) I have sent you a prescription for ciprofloxacin 500 mg twice daily for 7 days.  This should help get rid of your infection  2) We have collected blood work to check on your kidney function and also collected urine to confirm your infection and make sure the antibiotic will be effective against this bacteria  Thank you for allowing [place] to be part of your medical care!

## 2019-07-30 NOTE — Assessment & Plan Note (Addendum)
Patient reports 2 weeks of left aching flank pain, worse with movement, comes and goes.  Patient also reports 2 weeks of urinary frequency and urgency, denies dysuria.  Patient denies static symptoms including chest pain, shortness of breath, fever, chills, abdominal pain, nausea, or vomiting.  Patient reports history of 2 prior left-sided kidney infections, most recently 2 years ago, resolved after antibiotics.  On review of prior laboratory studies, creatinine of 0.82 on December 26, 2018.  On review of last office encounter, patient was without reported abdominal pain or urinary symptoms.  Assessment/Plan: Symptoms and physical exam consistent with acute complicated urinary tract infection. *Urinalysis with urine culture *Levofloxacin 500 mg twice daily for 7 days. Patient counseled on risks/benefits of this medication *Patient declined BMP to check renal function, patient counseled on benefits of the study but states he is afraid of needles

## 2019-07-31 ENCOUNTER — Other Ambulatory Visit: Payer: Self-pay | Admitting: Internal Medicine

## 2019-07-31 DIAGNOSIS — R1032 Left lower quadrant pain: Secondary | ICD-10-CM

## 2019-07-31 LAB — URINALYSIS, COMPLETE
Bilirubin, UA: NEGATIVE
Glucose, UA: NEGATIVE
Ketones, UA: NEGATIVE
Leukocytes,UA: NEGATIVE
Nitrite, UA: NEGATIVE
Protein,UA: NEGATIVE
RBC, UA: NEGATIVE
Specific Gravity, UA: 1.02 (ref 1.005–1.030)
Urobilinogen, Ur: 1 mg/dL (ref 0.2–1.0)
pH, UA: 5.5 (ref 5.0–7.5)

## 2019-07-31 LAB — MICROSCOPIC EXAMINATION
Bacteria, UA: NONE SEEN
Casts: NONE SEEN /lpf
Epithelial Cells (non renal): NONE SEEN /hpf (ref 0–10)
WBC, UA: NONE SEEN /hpf (ref 0–5)

## 2019-07-31 MED ORDER — INDOMETHACIN 25 MG PO CAPS: 25.0000 mg | ORAL_CAPSULE | Freq: Three times a day (TID) | ORAL | 0 refills | Status: DC | PRN

## 2019-07-31 NOTE — Progress Notes (Signed)
Spoke with patient and discussed results. Given absence of infectious signs on UA/microscopy and presence of crystals and RBCs, I have concerns for kidney stone. With patient's degree of pain, I have ordered the CT renal stone study STAT and provided patient with 7 days script for indomethacin  Jeanmarie Hubert, MD 07/31/2019, 2:37 PM

## 2019-08-01 LAB — URINE CULTURE: Organism ID, Bacteria: NO GROWTH

## 2019-08-04 NOTE — Progress Notes (Signed)
Internal Medicine Clinic Attending  Case discussed with Dr. MacLean at the time of the visit.  We reviewed the resident's history and exam and pertinent patient test results.  I agree with the assessment, diagnosis, and plan of care documented in the resident's note.    

## 2019-08-10 ENCOUNTER — Ambulatory Visit: Payer: Medicare Other | Attending: Internal Medicine

## 2019-08-10 ENCOUNTER — Ambulatory Visit: Payer: Medicare Other

## 2019-08-10 DIAGNOSIS — Z23 Encounter for immunization: Secondary | ICD-10-CM

## 2019-08-10 NOTE — Progress Notes (Signed)
° °  [id number]  Name:  Jared Shea.    MRN: [id number] DOB: 1956-04-03  08/10/2019  Mr. Victorio was observed post Covid-19 immunization for 15 minutes without incident. He was provided with Vaccine Information Sheet and instruction to access the V-Safe system.   Mr. Zea was instructed to call 911 with any severe reactions post vaccine:  Difficulty breathing   Swelling of face and throat   A fast heartbeat   A bad rash all over body   Dizziness and weakness   Immunizations Administered    Name Date Dose VIS Date Route   Pfizer COVID-19 Vaccine 08/10/2019 10:27 AM 0.3 mL 04/24/2019 Intramuscular   Manufacturer: [place]   Lot: [id number]   [place]: [id number]

## 2019-08-14 ENCOUNTER — Ambulatory Visit (HOSPITAL_COMMUNITY): Admission: RE | Admit: 2019-08-14 | Payer: Medicare Other | Source: Ambulatory Visit

## 2019-09-01 ENCOUNTER — Ambulatory Visit: Payer: Medicare Other

## 2019-09-10 ENCOUNTER — Ambulatory Visit: Payer: Medicare Other | Attending: Internal Medicine

## 2019-09-10 DIAGNOSIS — Z23 Encounter for immunization: Secondary | ICD-10-CM

## 2019-09-10 NOTE — Progress Notes (Signed)
   [id number]  Name:  Jared Shea.    MRN: [id number] DOB: 09/29/1955  09/10/2019  Jared Shea was observed post Covid-19 immunization for 30 minutes based on pre-vaccination screening without incident. He was provided with Vaccine Information Sheet and instruction to access the V-Safe system.   Jared Shea was instructed to call 911 with any severe reactions post vaccine: Marland Kitchen Difficulty breathing  . Swelling of face and throat  . A fast heartbeat  . A bad rash all over body  . Dizziness and weakness   Immunizations Administered    Name Date Dose VIS Date Route   Pfizer COVID-19 Vaccine 09/10/2019 10:17 AM 0.3 mL 07/08/2018 Intramuscular   Manufacturer: [place]   Lot: [id number]   [place]: [id number]

## 2019-10-13 ENCOUNTER — Other Ambulatory Visit: Payer: Self-pay | Admitting: *Deleted

## 2019-10-13 MED ORDER — LOSARTAN POTASSIUM-HCTZ 50-12.5 MG PO TABS: 1.0000 | ORAL_TABLET | Freq: Every day | ORAL | 5 refills | Status: DC

## 2019-11-23 ENCOUNTER — Encounter: Payer: Self-pay | Admitting: Internal Medicine

## 2019-11-23 ENCOUNTER — Ambulatory Visit (INDEPENDENT_AMBULATORY_CARE_PROVIDER_SITE_OTHER): Payer: Medicare Other | Admitting: Internal Medicine

## 2019-11-23 ENCOUNTER — Other Ambulatory Visit: Payer: Self-pay

## 2019-11-23 VITALS — BP 126/72 | HR 66 | Temp 98.2°F | Ht 73.0 in | Wt 180.5 lb

## 2019-11-23 DIAGNOSIS — I10 Essential (primary) hypertension: Secondary | ICD-10-CM

## 2019-11-23 DIAGNOSIS — R202 Paresthesia of skin: Secondary | ICD-10-CM | POA: Insufficient documentation

## 2019-11-23 DIAGNOSIS — F17210 Nicotine dependence, cigarettes, uncomplicated: Secondary | ICD-10-CM | POA: Diagnosis not present

## 2019-11-23 DIAGNOSIS — F17200 Nicotine dependence, unspecified, uncomplicated: Secondary | ICD-10-CM

## 2019-11-23 MED ORDER — VARENICLINE TARTRATE 0.5 MG X 11 & 1 MG X 42 PO MISC: ORAL | 0 refills | Status: DC

## 2019-11-23 NOTE — Patient Instructions (Signed)
Thank you for allowing [place] to provide your care today. Today we discussed your headache    I have ordered bmp labs for you. I will call if any are abnormal.    Today we made the following changes to your medications.    Please use Chantix pack as prescribed to assist in your smoking cessation  Please follow-up in 3 months.    Should you have any questions or concerns please call the internal medicine clinic at [phone].

## 2019-11-23 NOTE — Assessment & Plan Note (Signed)
Mentions >35 year smoking hx. Currently pack a day smoking history. Discussed risks of continued tobacco use including lung cancer, heart attacks and strokes. He mentions having previous success with Chantix and expresses interest in trying again.   - Chantix refill sent

## 2019-11-23 NOTE — Assessment & Plan Note (Signed)
Jared Shea is a 64 yo M w/ PMH of HTN, tobacco use presenting to [facility] w/ complaint of facial tingling. He mentions that he was in his usual state of health until late afternoon yesterday when he experience brief episode of facial tingling and cramp associated with headache. He mentions this episode was acute onset and lasted only 2-3 seconds. He denies prior hx of similar symptoms. Mentions associated bilateral upper extremity weakness. 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 He denies any chest pain, palpitation, nausea, vomiting, blurry vision.  A/P Jared Shea presents w/ facial tingling and muscle contraction. He states concerns for stroke but reassuring neuro exam. Hypocalcemia can cause similar symptoms but currently on HCTZ  - Discussed monitoring for further symptoms - Advised on smoking cessation to reduce CVA risk

## 2019-11-23 NOTE — Progress Notes (Signed)
CC: lip tingling  HPI: [name] is a 64 y.o. with PMH listed below presenting with complaint of lip tingling. Please see problem based assessment and plan for further details.  Past Medical History:  Diagnosis Date  . Bursitis of right hip   . Chest pain 08/12/2018  . Chronic cough   . Dependence on nicotine from cigarettes   . Essential hypertension 07/28/2018  . Viral illness 03/23/2019   Review of Systems: Review of Systems  Constitutional: Negative for chills, fever and malaise/fatigue.  Respiratory: Negative for shortness of breath.   Cardiovascular: Negative for chest pain.  Gastrointestinal: Negative for constipation, diarrhea, nausea and vomiting.  All other systems reviewed and are negative.    Physical Exam: Vitals:   11/23/19 1433  BP: 126/72  Pulse: 66  Temp: 98.2 F (36.8 C)  TempSrc: Oral  SpO2: 99%  Weight: 180 lb 8 oz (81.9 kg)  Height: 6\' 1"  (1.854 m)   Gen: Well-developed, well nourished, NAD HEENT: NCAT head, hearing intact CV: RRR, S1, S2 normal Pulm: CTAB, No rales, no wheezes Extm: ROM intact, Peripheral pulses intact, No peripheral edema Neuro: Mental status: A&Ox3 Cranial Nerves:             II: PERRL             III, IV, VI: Extra-occular motions intact bilaterally             V, VII: Face symmetric, sensation intact in all 3 divisions               VIII: hearing normal to rubbing fingers bilaterally               IX, X: palate rises symmetrically             XI: Head turn and shoulder shrug normal bilaterally               XII: tongue midline    Motor: Strength 5/5 on all upper and lower extremities, bulk muscle and tone are normal  Deep Tendon Reflexes: 2+ symmetric Gait: Normal Sensory: Light touch intact and symmetric bilaterally  Coordination: There is no dysmetria on finger-to-nose Psychiatric: Normal mood and affect  Assessment & Plan:   Facial tingling Jared Shea is a 64 yo M w/ PMH of HTN, tobacco use presenting to  [facility] w/ complaint of facial tingling. He mentions that he was in his usual state of health until late afternoon yesterday when he experience brief episode of facial tingling and cramp associated with headache. He mentions this episode was acute onset and lasted only 2-3 seconds. He denies prior hx of similar symptoms. Mentions associated bilateral upper extremity weakness. 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 He denies any chest pain, palpitation, nausea, vomiting, blurry vision.  A/P Jared Shea presents w/ facial tingling and muscle contraction. He states concerns for stroke but reassuring neuro exam. Hypocalcemia can cause similar symptoms but currently on HCTZ  - Discussed monitoring for further symptoms - Advised on smoking cessation to reduce CVA risk  Essential hypertension BP Readings from Last 3 Encounters:  11/23/19 126/72  07/30/19 130/77  06/10/19 (!) 102/57   BP at goal. Currently on losartan-HCTZ 50-12.5mg  daily. Previous bmp last checked on 12/2018. Will get BMP today to assess electrolytes.  - BMP - C/w losartan-HCTZ  Tobacco use disorder Mentions >35 year smoking hx. Currently pack a day  smoking history. Discussed risks of continued tobacco use including lung cancer, heart attacks and strokes. He mentions having previous success with Chantix and expresses interest in trying again.   - Chantix refill sent    Patient discussed with Dr. Evette Doffing  -Gilberto Better, [facility] Pager: [phone]

## 2019-11-23 NOTE — Assessment & Plan Note (Signed)
BP Readings from Last 3 Encounters:  11/23/19 126/72  07/30/19 130/77  06/10/19 (!) 102/57   BP at goal. Currently on losartan-HCTZ 50-12.5mg  daily. Previous bmp last checked on 12/2018. Will get BMP today to assess electrolytes.  - BMP - C/w losartan-HCTZ

## 2019-11-24 LAB — BMP8+ANION GAP
Anion Gap: 15 mmol/L (ref 10.0–18.0)
BUN/Creatinine Ratio: 19 (ref 10–24)
BUN: 14 mg/dL (ref 8–27)
CO2: 22 mmol/L (ref 20–29)
Calcium: 9.6 mg/dL (ref 8.6–10.2)
Chloride: 103 mmol/L (ref 96–106)
Creatinine, Ser: 0.72 mg/dL — ABNORMAL LOW (ref 0.76–1.27)
GFR calc Af Amer: 113 mL/min/{1.73_m2} (ref >59)
GFR calc non Af Amer: 98 mL/min/{1.73_m2} (ref >59)
Glucose: 86 mg/dL (ref 65–99)
Potassium: 4.8 mmol/L (ref 3.5–5.2)
Sodium: 140 mmol/L (ref 134–144)

## 2019-11-24 NOTE — Addendum Note (Signed)
Addended by: Lalla Brothers T on: 11/24/2019 09:53 AM   Modules accepted: Level of Service

## 2019-11-24 NOTE — Progress Notes (Signed)
Internal Medicine Clinic Attending  Case discussed with Dr. Lee  At the time of the visit.  We reviewed the resident's history and exam and pertinent patient test results.  I agree with the assessment, diagnosis, and plan of care documented in the resident's note.    

## 2019-11-25 ENCOUNTER — Telehealth: Payer: Self-pay | Admitting: *Deleted

## 2019-11-25 DIAGNOSIS — F17200 Nicotine dependence, unspecified, uncomplicated: Secondary | ICD-10-CM

## 2019-11-25 MED ORDER — NICOTINE POLACRILEX 4 MG MT GUM: 4.0000 mg | CHEWING_GUM | OROMUCOSAL | 2 refills | Status: DC | PRN

## 2019-11-25 NOTE — Telephone Encounter (Signed)
Pt would like md to call him with his lab results and plan of care at [phone]

## 2019-11-25 NOTE — Telephone Encounter (Signed)
Spoke with Jared Shea regarding his negative lab results and provided reassurance. Jared Shea states he has not taken the Chantix because he would like to try the gum instead for tobacco cessation. Advised that he can buy OTC. Send script so he knows what to look for.

## 2019-12-09 ENCOUNTER — Telehealth: Payer: Self-pay | Admitting: *Deleted

## 2019-12-09 ENCOUNTER — Telehealth: Payer: Self-pay | Admitting: Student

## 2019-12-09 ENCOUNTER — Encounter: Payer: Self-pay | Admitting: Student

## 2019-12-09 ENCOUNTER — Ambulatory Visit (INDEPENDENT_AMBULATORY_CARE_PROVIDER_SITE_OTHER): Payer: Medicare Other | Admitting: Student

## 2019-12-09 VITALS — BP 119/83 | HR 74 | Temp 98.0°F | Ht 73.0 in | Wt 179.2 lb

## 2019-12-09 DIAGNOSIS — F17200 Nicotine dependence, unspecified, uncomplicated: Secondary | ICD-10-CM

## 2019-12-09 DIAGNOSIS — R202 Paresthesia of skin: Secondary | ICD-10-CM

## 2019-12-09 DIAGNOSIS — R569 Unspecified convulsions: Secondary | ICD-10-CM

## 2019-12-09 DIAGNOSIS — F17210 Nicotine dependence, cigarettes, uncomplicated: Secondary | ICD-10-CM | POA: Diagnosis not present

## 2019-12-09 DIAGNOSIS — G40209 Localization-related (focal) (partial) symptomatic epilepsy and epileptic syndromes with complex partial seizures, not intractable, without status epilepticus: Secondary | ICD-10-CM | POA: Insufficient documentation

## 2019-12-09 DIAGNOSIS — Z0000 Encounter for general adult medical examination without abnormal findings: Secondary | ICD-10-CM

## 2019-12-09 DIAGNOSIS — I10 Essential (primary) hypertension: Secondary | ICD-10-CM | POA: Diagnosis not present

## 2019-12-09 HISTORY — DX: Localization-related (focal) (partial) symptomatic epilepsy and epileptic syndromes with complex partial seizures, not intractable, without status epilepticus: G40.209

## 2019-12-09 MED ORDER — LEVETIRACETAM 750 MG PO TABS: 750.0000 mg | ORAL_TABLET | Freq: Two times a day (BID) | ORAL | 3 refills | Status: DC

## 2019-12-09 MED ORDER — NICOTINE POLACRILEX 4 MG MT GUM: 4.0000 mg | CHEWING_GUM | OROMUCOSAL | 2 refills | Status: DC | PRN

## 2019-12-09 NOTE — Patient Instructions (Addendum)
Today, we discussed that your symptoms of shortness of breath, mouth twitching and weakness may be signs of seizures. I will prescribe Keppra (Levetiracetam) 750mg . Please take 1 tablet in the morning, and 1 tablet in the evening (total of 2 tablets per day). I am ordering an MRI (imaging) of the brain to rule out any underlying cause for your recent symptoms, and I have placed a referral to neurology.  You should receive a call from neurology to schedule an appointment soon.   Please refrain from driving for 6 months or until cleared by the neurologist and avoid ladders and heights during this time.  Please schedule a follow-up appointment here in clinic in 2 to 3 weeks and don't hesitate to call [phone] with any questions or concerns in the meantime.  Thank you,  Dr. Jeralyn Bennett   Seizure, Adult A seizure is a sudden burst of abnormal electrical activity in the brain. Seizures usually last from 30 seconds to 2 minutes. The abnormal activity temporarily interrupts normal brain function. A seizure can cause many different symptoms depending on where in the brain it starts. What are the causes? Common causes of this condition include:  Fever or infection.  Brain abnormality, injury, bleeding, or tumor.  Low blood sugar.  Metabolic disorders or other conditions that are passed from parent to child (are inherited).  Reaction to a substance, such as a drug or a medicine, or suddenly stopping the use of a substance (withdrawal).  Stroke.  Developmental disorders such as autism or cerebral palsy. In some cases, the cause of this condition may not be known. Some people who have a seizure never have another one. Seizures usually do not cause brain damage or permanent problems unless they are prolonged. A person who has repeated seizures over time without a clear cause has a condition called epilepsy. What increases the risk? You are more likely to develop this condition if you  have:  A family history of epilepsy.  Had a tonic-clonic seizure in the past. This is a type of seizure that involves whole-body contraction of muscles and a loss of consciousness.  Autism, cerebral palsy, or other brain disorders.  A history of head trauma, lack of oxygen at birth, or strokes. What are the signs or symptoms? There are many different types of seizures. The symptoms of a seizure vary depending on the type of seizure you have. Examples of symptoms during a seizure include:  Uncontrollable shaking (convulsions).  Stiffening of the body.  Loss of consciousness.  Head nodding.  Staring.  Not responding to sound or touch.  Loss of bladder or bowel control. Some people have symptoms right before a seizure happens (aura) and right after a seizure happens (postictal). Symptoms before a seizure may include:  Fear or anxiety.  Nausea.  Feeling like the room is spinning (vertigo).  A feeling of having seen or heard something before (dj vu).  Odd tastes or smells.  Changes in vision, such as seeing flashing lights or spots. Symptoms after a seizure may include:  Confusion.  Sleepiness.  Headache.  Weakness on one side of the body. How is this diagnosed? This condition may be diagnosed based on:  A description of your symptoms. Video of your seizures can be helpful.  Your medical history.  A physical exam. You may also have tests, including:  Blood tests.  CT scan.  MRI.  Electroencephalogram (EEG). This test measures electrical activity in the brain. An EEG can predict whether seizures will return (  recur).  A spinal tap (also called a lumbar puncture). This is the removal and testing of fluid that surrounds the brain and spinal cord. How is this treated? Most seizures will stop on their own in under 5 minutes, and no treatment is needed. Seizures that last longer than 5 minutes will usually need treatment. Treatment can include:  Medicines  given through an IV.  Avoiding known triggers, such as medicines that you take for another condition.  Medicines to treat epilepsy (antiepileptics), if epilepsy caused your seizures.  Surgery to stop seizures, if you have epilepsy that does not respond to medicines. Follow these instructions at home: Medicines  Take over-the-counter and prescription medicines only as told by your health care provider.  Avoid any substances that may prevent your medicine from working properly, such as alcohol. Activity  Do not drive, swim, or do any other activities that would be dangerous if you had another seizure. Wait until your health care provider says it is safe to do them.  If you live in the [place] check with your local DMV (department of motor vehicles) to find out about local driving laws. Each state has specific rules about when you can legally return to driving.  Get enough rest. Lack of sleep can make seizures more likely to occur. Educating others Teach friends and family what to do if you have a seizure. They should:  Lay you on the ground to prevent a fall.  Cushion your head and body.  Loosen any tight clothing around your neck.  Turn you on your side. If vomiting occurs, this helps keep your airway clear.  Not hold you down. Holding you down will not stop the seizure.  Not put anything into your mouth.  Know whether or not you need emergency care. For example, they should get help right away if you have a seizure that lasts longer than 5 minutes or have several seizures in a row.  Stay with you until you recover.  General instructions  Contact your health care provider each time you have a seizure.  Avoid anything that has ever triggered a seizure for you.  Keep a seizure diary. Record what you remember about each seizure, especially anything that might have triggered the seizure.  Keep all follow-up visits as told by your health care provider. This is  important. Contact a health care provider if:  You have another seizure.  You have seizures more often.  Your seizure symptoms change.  You continue to have seizures with treatment.  You have symptoms of an infection or illness. This might increase your risk of having a seizure. Get help right away if:  You have a seizure that: ? Lasts longer than 5 minutes. ? Is different than previous seizures. ? Leaves you unable to speak or use a part of your body. ? Makes it harder to breathe.  You have: ? A seizure after a head injury. ? Multiple seizures in a row. ? Confusion or a severe headache right after a seizure.  You do not wake up immediately after a seizure.  You injure yourself during a seizure. These symptoms may represent a serious problem that is an emergency. Do not wait to see if the symptoms will go away. Get medical help right away. Call your local emergency services (911 in the [place] Do not drive yourself to the hospital. Summary  Seizures are caused by abnormal electrical activity in the brain. The activity disrupts normal brain function and can cause various  symptoms, such as convulsions, abnormal movements, or a change in consciousness.  There are many causes of seizures, including illnesses, medicines, genetic conditions, head injuries, strokes, tumors, substance abuse, or substance withdrawal.  Most seizures will stop on their own in under 5 minutes. Seizures that last longer than 5 minutes are a medical emergency and require immediate treatment.  Many medicines are used to treat seizures. Take over-the-counter and prescription medicines only as told by your health care provider. This information is not intended to replace advice given to you by your health care provider. Make sure you discuss any questions you have with your health care provider. Document Revised: 07/18/2018 Document Reviewed: 07/18/2018 Elsevier Patient Education  [place].

## 2019-12-09 NOTE — Progress Notes (Signed)
   CC: Facial twitching  HPI:  [name] is a 64 y.o. male with PMHx of essential HTN and tobacco use disorder presenting after experiencing a second episode of left lip twitching. He states his first episode occurred 11/23/19 when he was at work in a shed on a hot day and experienced left lip twitching, SOB as if he were gasping, and diffuse, generalized weakness. He states that he noticed a headache just prior to his symptoms, and that his symptoms lasted less than 1 minute, and that he developed confusion and tiredness after the incident. He states a coworker witnessed this happen and asked if he was alright afterwards. He did not fall and doesn't recall losing consciousness. He states that he experienced the same symptoms last night which awoke him from sleep. His symptoms were preceded by a headache and lasted about 1 minute, followed by confusion, tiredness, tongue pain, and muscle aches. He also notes that he tried to talk during the episode but states he was unable to do so. He notes that he doesn't have AC and says he was hot at the time of his symptoms. No one witnessed the event. He is concerned that these may be seizures, and states he had seizures around 35 or 64 years old with shaking, although is unable to elaborate further and has never seen a neurologist as far as he is aware. He denies any urinary incontinence during the episodes and currently denies any symptoms at all.   Past Medical History:  Diagnosis Date  . Bursitis of right hip   . Chest pain 08/12/2018  . Chronic cough   . Dependence on nicotine from cigarettes   . Essential hypertension 07/28/2018  . Viral illness 03/23/2019   PSHx: Patient states he lives with his son. He does smoke cigarettes (1PPD for about 35 years) but states that he is going to stop smoking today. He denies alcohol or other drug use. He does electrical work.  Allergies: Patient experiences hives with penicillin.   Review of Systems:  All others  negative except as noted in HPI. Specifically, no history of slurred or disorganized speech, nausea, vomiting, diarrhea, constipation, fevers, chills, focal weakness, numbness, light-headedness, dizziness, or vision changes. No current headache, twitching or SOB.   Vitals:   12/09/19 1436  BP: 119/83  Pulse: 74  Temp: 98 F (36.7 C)  TempSrc: Oral  SpO2: 99%  Weight: 179 lb 3.2 oz (81.3 kg)  Height: 6\' 1"  (1.854 m)   Physical Exam: Constitutional: Patient appears well. No acute distress. Eyes: No conjunctival injection. Sclera non-icteric.  HENT: Moist mucus membranes. No oral lesions noted. Respiratory: Lungs are clear to auscultation, bilaterally. No wheezes, rales, or rhonchi. Cardiovascular: Regular rate and rhythm. No murmurs, rubs, or gallops. Neurological: Patient is alert. Cranial nerves II through XII intact. Motor strength is 5/5 in all four extremities.  Musculoskeletal: Muscle tone and bulk are normal without tenderness to palpation.  Skin: No lesions notes. No jaundice.  Assessment & Plan:   See Encounters Tab for problem based charting.  Patient seen with Dr. Evette Doffing.  Jeralyn Bennett, PGY1 [facility]  Pager: [phone]

## 2019-12-09 NOTE — Telephone Encounter (Signed)
Agree with appointment this pm

## 2019-12-09 NOTE — Assessment & Plan Note (Signed)
A: Blood pressure is currently well-controlled on losartan-HCTZ 50-12.5 daily.  BP Readings from Last 3 Encounters:  12/09/19 119/83  11/23/19 126/72  07/30/19 130/77   P: Continue current regimen.

## 2019-12-09 NOTE — Assessment & Plan Note (Addendum)
A: Patient endorses 2 episodes (11/23/19 and 12/09/19) of left lip twitching, gasping shortness of breath, and generalized weakness lasting about 1 minute each, preceded by headaches and followed by fatigue and confusion. He also endorses myalgias and tongue pain today. He notes a history of seizures when he was 69 or 64 years old and has never seen a neurologist. He is on no anti-epileptic medications. Denies history of head trauma, LOC, or any current symptoms. History consistent with aware, focal seizures.   P: Will start treating empirically with keppra 750mg  BID. - Referred patient to neurology for EEG - Ordered STAT MRI w/ and w/o contrast to rule out underlying lesion vs. Infarct vs. Bleed  - Discussed with patient that by [facility] law, he should not drive for 6 months. Patient expresses understanding. Also encouraged him to avoid heights and ladders and machinery operation, especially given his line of work.

## 2019-12-09 NOTE — Assessment & Plan Note (Signed)
A: Patient is overdue for colonoscopy, pneumonia shot, and tetanus shot. He is also a candidate for low-dose CT for lung cancer screening but is not interested in pursuing any of the above at this time. He says that he has been screened negative for HIV and hepatitis C at some point in the past.   P: continue to assess desire for above at future visits.

## 2019-12-09 NOTE — Telephone Encounter (Signed)
Pt calls and states he has had another episode, last night. appt this pm yellow team. He then starts talking about having seizures when he was a boy. He is advised to tell the md he sees about this. He is advised to call 911 if he has chest pain, shortness of breath, change in speech, thoughts, vision, strength, walking. He is agreeable and states he hasnt done any of those.  Dr Konrad Penta yellow team [phone]

## 2019-12-09 NOTE — Assessment & Plan Note (Signed)
A: Patient has smoked cigarettes for >35 years, but states that he wants to quit smoking starting today. He was prescribed Chantix last visit but states it is too expensive.   P: Prescribed Nicorette gum - Addendum: spoke with patient over the phone and he says this is also too costly. He says he will try cold [place]. Follow up next visit.

## 2019-12-09 NOTE — Telephone Encounter (Signed)
Spoke with the patient informing him that I sent over his prescription for Nicorette. Patient states it is too expensive and says he will try cessation today without medication. Follow at future visits.  Jeralyn Bennett, PGY1 Internal Medicine  Pager: [phone]

## 2019-12-09 NOTE — Assessment & Plan Note (Signed)
>>  ASSESSMENT AND PLAN FOR FOCAL SEIZURES ([facility]) WRITTEN ON 12/09/2019  6:53 PM BY SPEAKMAN, RACHEL, MD  A: Patient endorses 2 episodes (11/23/19 and 12/09/19) of left lip twitching, gasping shortness of breath, and generalized weakness lasting about 1 minute each, preceded by headaches and followed by fatigue and confusion. He also endorses myalgias and tongue pain today. He notes a history of seizures when he was 69 or 64 years old and has never seen a neurologist. He is on no anti-epileptic medications. Denies history of head trauma, LOC, or any current symptoms. History consistent with aware, focal seizures.   P: Will start treating empirically with keppra 750mg  BID. - Referred patient to neurology for EEG - Ordered STAT MRI w/ and w/o contrast to rule out underlying lesion vs. Infarct vs. Bleed  - Discussed with patient that by [facility] law, he should not drive for 6 months. Patient expresses understanding. Also encouraged him to avoid heights and ladders and machinery operation, especially given his line of work.

## 2019-12-10 NOTE — Progress Notes (Signed)
Internal Medicine Clinic Attending  I saw and evaluated the patient.  I personally confirmed the key portions of the history and exam documented by Dr. Konrad Penta and I reviewed pertinent patient test results.  The assessment, diagnosis, and plan were formulated together and I agree with the documentation in the resident's note.   Patient with new complaint of facial twitching followed by period of weakness and confusion. Clinical history gives a high pre-test probability for focal seizure activity. Plan is to start keppra, refer to neurology for EEG, and obtain MRI brain to evaluate for structural disease that may be causing new seizures.

## 2019-12-14 ENCOUNTER — Telehealth: Payer: Self-pay | Admitting: Internal Medicine

## 2019-12-14 ENCOUNTER — Emergency Department (HOSPITAL_COMMUNITY): Payer: Medicare Other

## 2019-12-14 ENCOUNTER — Other Ambulatory Visit: Payer: Self-pay

## 2019-12-14 ENCOUNTER — Telehealth: Payer: Self-pay

## 2019-12-14 ENCOUNTER — Encounter: Payer: Medicare Other | Admitting: Internal Medicine

## 2019-12-14 ENCOUNTER — Observation Stay (HOSPITAL_COMMUNITY)
Admission: EM | Admit: 2019-12-14 | Discharge: 2019-12-15 | Disposition: A | Discharge status: Home / Self Care | Payer: Medicare Other | Attending: Student in an Organized Health Care Education/Training Program | Admitting: Student in an Organized Health Care Education/Training Program

## 2019-12-14 DIAGNOSIS — R590 Localized enlarged lymph nodes: Secondary | ICD-10-CM | POA: Diagnosis not present

## 2019-12-14 DIAGNOSIS — I10 Essential (primary) hypertension: Secondary | ICD-10-CM | POA: Insufficient documentation

## 2019-12-14 DIAGNOSIS — G40909 Epilepsy, unspecified, not intractable, without status epilepticus: Secondary | ICD-10-CM | POA: Insufficient documentation

## 2019-12-14 DIAGNOSIS — G936 Cerebral edema: Secondary | ICD-10-CM | POA: Diagnosis not present

## 2019-12-14 DIAGNOSIS — Z20822 Contact with and (suspected) exposure to covid-19: Secondary | ICD-10-CM | POA: Diagnosis not present

## 2019-12-14 DIAGNOSIS — R0602 Shortness of breath: Secondary | ICD-10-CM | POA: Insufficient documentation

## 2019-12-14 DIAGNOSIS — G9389 Other specified disorders of brain: Secondary | ICD-10-CM

## 2019-12-14 DIAGNOSIS — R911 Solitary pulmonary nodule: Secondary | ICD-10-CM | POA: Diagnosis present

## 2019-12-14 DIAGNOSIS — G939 Disorder of brain, unspecified: Secondary | ICD-10-CM

## 2019-12-14 DIAGNOSIS — G40209 Localization-related (focal) (partial) symptomatic epilepsy and epileptic syndromes with complex partial seizures, not intractable, without status epilepticus: Secondary | ICD-10-CM

## 2019-12-14 DIAGNOSIS — R519 Headache, unspecified: Secondary | ICD-10-CM | POA: Diagnosis present

## 2019-12-14 DIAGNOSIS — R569 Unspecified convulsions: Secondary | ICD-10-CM

## 2019-12-14 DIAGNOSIS — F17210 Nicotine dependence, cigarettes, uncomplicated: Secondary | ICD-10-CM | POA: Insufficient documentation

## 2019-12-14 DIAGNOSIS — F17200 Nicotine dependence, unspecified, uncomplicated: Secondary | ICD-10-CM | POA: Diagnosis present

## 2019-12-14 LAB — COMPREHENSIVE METABOLIC PANEL
ALT: 32 U/L (ref 0–44)
AST: 34 U/L (ref 15–41)
Albumin: 4.1 g/dL (ref 3.5–5.0)
Alkaline Phosphatase: 68 U/L (ref 38–126)
Anion gap: 9 (ref 5–15)
BUN: 9 mg/dL (ref 8–23)
CO2: 25 mmol/L (ref 22–32)
Calcium: 9.1 mg/dL (ref 8.9–10.3)
Chloride: 107 mmol/L (ref 98–111)
Creatinine, Ser: 0.73 mg/dL (ref 0.61–1.24)
GFR calc Af Amer: >60 mL/min (ref >60)
GFR calc non Af Amer: >60 mL/min (ref >60)
Glucose, Bld: 109 mg/dL — ABNORMAL HIGH (ref 70–99)
Potassium: 4 mmol/L (ref 3.5–5.1)
Sodium: 141 mmol/L (ref 135–145)
Total Bilirubin: 0.7 mg/dL (ref 0.3–1.2)
Total Protein: 7.4 g/dL (ref 6.5–8.1)

## 2019-12-14 LAB — I-STAT CHEM 8, ED
BUN: 9 mg/dL (ref 8–23)
Calcium, Ion: 1.21 mmol/L (ref 1.15–1.40)
Chloride: 104 mmol/L (ref 98–111)
Creatinine, Ser: 0.6 mg/dL — ABNORMAL LOW (ref 0.61–1.24)
Glucose, Bld: 106 mg/dL — ABNORMAL HIGH (ref 70–99)
HCT: 41 % (ref 39.0–52.0)
Hemoglobin: 13.9 g/dL (ref 13.0–17.0)
Potassium: 3.7 mmol/L (ref 3.5–5.1)
Sodium: 146 mmol/L — ABNORMAL HIGH (ref 135–145)
TCO2: 25 mmol/L (ref 22–32)

## 2019-12-14 LAB — DIFFERENTIAL
Abs Immature Granulocytes: 0.02 10*3/uL (ref 0.00–0.07)
Basophils Absolute: 0 10*3/uL (ref 0.0–0.1)
Basophils Relative: 0 %
Eosinophils Absolute: 0 10*3/uL (ref 0.0–0.5)
Eosinophils Relative: 1 %
Immature Granulocytes: 0 %
Lymphocytes Relative: 38 %
Lymphs Abs: 2.7 10*3/uL (ref 0.7–4.0)
Monocytes Absolute: 0.5 10*3/uL (ref 0.1–1.0)
Monocytes Relative: 7 %
Neutro Abs: 3.8 10*3/uL (ref 1.7–7.7)
Neutrophils Relative %: 54 %

## 2019-12-14 LAB — CBC
HCT: 41.8 % (ref 39.0–52.0)
Hemoglobin: 14 g/dL (ref 13.0–17.0)
MCH: 30 pg (ref 26.0–34.0)
MCHC: 33.5 g/dL (ref 30.0–36.0)
MCV: 89.7 fL (ref 80.0–100.0)
Platelets: 407 10*3/uL — ABNORMAL HIGH (ref 150–400)
RBC: 4.66 MIL/uL (ref 4.22–5.81)
RDW: 14.3 % (ref 11.5–15.5)
WBC: 7.1 10*3/uL (ref 4.0–10.5)
nRBC: 0 % (ref 0.0–0.2)

## 2019-12-14 LAB — APTT: aPTT: 35 seconds (ref 24–36)

## 2019-12-14 LAB — PROTIME-INR
INR: 1.1 (ref 0.8–1.2)
Prothrombin Time: 13.4 seconds (ref 11.4–15.2)

## 2019-12-14 LAB — SARS CORONAVIRUS 2 BY RT PCR (HOSPITAL ORDER, PERFORMED IN ~~LOC~~ HOSPITAL LAB): SARS Coronavirus 2: NEGATIVE

## 2019-12-14 LAB — CBG MONITORING, ED: Glucose-Capillary: 96 mg/dL (ref 70–99)

## 2019-12-14 MED ORDER — ACETAMINOPHEN 325 MG PO TABS: 650.0000 mg | ORAL_TABLET | Freq: Four times a day (QID) | ORAL | Status: DC | PRN

## 2019-12-14 MED ORDER — ACETAMINOPHEN 650 MG RE SUPP: 650.0000 mg | Freq: Four times a day (QID) | RECTAL | Status: DC | PRN

## 2019-12-14 MED ORDER — GADOBUTROL 1 MMOL/ML IV SOLN
8.0000 mL | Freq: Once | INTRAVENOUS | Status: AC | PRN
  Administered 2019-12-14: 8 mL via INTRAVENOUS

## 2019-12-14 MED ORDER — LEVETIRACETAM 750 MG PO TABS
750.0000 mg | ORAL_TABLET | Freq: Two times a day (BID) | ORAL | Status: DC
  Administered 2019-12-15 (×2): 750 mg via ORAL
  Filled 2019-12-14 (×2): qty 1

## 2019-12-14 MED ORDER — DEXAMETHASONE SODIUM PHOSPHATE 10 MG/ML IJ SOLN
10.0000 mg | Freq: Once | INTRAMUSCULAR | Status: AC
  Administered 2019-12-14: 10 mg via INTRAVENOUS
  Filled 2019-12-14: qty 1

## 2019-12-14 MED ORDER — RAMELTEON 8 MG PO TABS
8.0000 mg | ORAL_TABLET | Freq: Every day | ORAL | Status: DC
  Administered 2019-12-15: 8 mg via ORAL
  Filled 2019-12-14: qty 1

## 2019-12-14 MED ORDER — LOSARTAN POTASSIUM-HCTZ 50-12.5 MG PO TABS: 1.0000 | ORAL_TABLET | Freq: Every day | ORAL | Status: DC

## 2019-12-14 MED ORDER — NICOTINE 21 MG/24HR TD PT24
21.0000 mg | MEDICATED_PATCH | Freq: Every day | TRANSDERMAL | Status: DC
  Administered 2019-12-15 (×2): 21 mg via TRANSDERMAL
  Filled 2019-12-14: qty 1

## 2019-12-14 MED ORDER — ENOXAPARIN SODIUM 40 MG/0.4ML ~~LOC~~ SOLN: 40.0000 mg | SUBCUTANEOUS | Status: DC

## 2019-12-14 MED ORDER — LOSARTAN POTASSIUM 50 MG PO TABS
50.0000 mg | ORAL_TABLET | Freq: Every day | ORAL | Status: DC
  Administered 2019-12-15: 50 mg via ORAL
  Filled 2019-12-14: qty 1

## 2019-12-14 MED ORDER — HYDROCHLOROTHIAZIDE 12.5 MG PO CAPS
12.5000 mg | ORAL_CAPSULE | Freq: Every day | ORAL | Status: DC
  Administered 2019-12-15: 12.5 mg via ORAL
  Filled 2019-12-14: qty 1

## 2019-12-14 MED ORDER — SODIUM CHLORIDE 0.9% FLUSH
3.0000 mL | Freq: Once | INTRAVENOUS | Status: AC
  Administered 2019-12-14: 3 mL via INTRAVENOUS

## 2019-12-14 MED ORDER — DEXAMETHASONE 4 MG PO TABS
6.0000 mg | ORAL_TABLET | Freq: Every day | ORAL | Status: DC
  Filled 2019-12-14: qty 2

## 2019-12-14 MED ORDER — IOHEXOL 300 MG/ML  SOLN
100.0000 mL | Freq: Once | INTRAMUSCULAR | Status: AC | PRN
  Administered 2019-12-14: 100 mL via INTRAVENOUS

## 2019-12-14 NOTE — Telephone Encounter (Signed)
Patient contacted clinic this AM with acute severe headache and vision changes. He was initially scheduled for an acute care visit at 3:15 pm.   Chart was reviewed. Patient was recently diagnosed with focal seizures and started on Keppra. Stat MRI brain w/wo ordered, as well as urgent referral to Neurology. Unfortunately, patient has not been able to obtain MRI.   In light of new seizures, with his current severe headache and vision changes, I contacted patient to recommend he be evaluated with the ED. Patient expressed understanding and verbalized that he will go to [facility] today.

## 2019-12-14 NOTE — ED Provider Notes (Signed)
[facility] EMERGENCY DEPARTMENT Provider Note   CSN: [id number] Arrival date & time: 12/14/19  1404     History Chief Complaint  Patient presents with  . Sent by doc  . Seizures    Zorawar Strollo. is a 64 y.o. male.  Pt presents to the ED today with headache and seizure.  Pt said he's had a headache for about 3 months.  It is abnormal for patient.  Pt said he also developed seizures.  He first noticed some twitching in his left mouth.  Pt said he's had some intermittent twitching of his left arm as well.  Pt was put on Keppra 750 mg bid on 7/28.  Stat MRI ordered then, but it has not yet been done.  Pt called his PCP today with worsening headache and some vision changes, so they told him to come to the ED.        Past Medical History:  Diagnosis Date  . Bursitis of right hip   . Chest pain 08/12/2018  . Chronic cough   . Dependence on nicotine from cigarettes   . Essential hypertension 07/28/2018  . Viral illness 03/23/2019    Patient Active Problem List   Diagnosis Date Noted  . Focal seizure ([place]) 12/09/2019  . Facial tingling 11/23/2019  . Healthcare maintenance 12/24/2018  . Tobacco use disorder 08/12/2018  . Essential hypertension 07/28/2018    Past Surgical History:  Procedure Laterality Date  . NO PAST SURGERIES         Family History  Problem Relation Age of Onset  . Heart disease Mother        CABG  . Kidney disease Sister   . Diabetes Brother   . Kidney disease Brother        KIDNEY TRANSPLANT  . Hypertension Sister   . Healthy Daughter   . Healthy Daughter   . Healthy Son     Social History   Tobacco Use  . Smoking status: Current Every Day Smoker    Packs/day: 0.50    Years: 48.00    Pack years: 24.00    Types: Cigarettes    Start date: 08/07/1970  . Smokeless tobacco: Never Used  Vaping Use  . Vaping Use: Never used  Substance Use Topics  . Alcohol use: Yes  . Drug use: Never    Home Medications Prior to  Admission medications   Medication Sig Start Date End Date Taking? Authorizing Provider  ibuprofen (ADVIL) 200 MG tablet Take 200 mg by mouth every 6 (six) hours as needed.   Yes [provider]  ibuprofen (ADVIL) 400 MG tablet Take 400 mg by mouth every 6 (six) hours as needed for headache.   Yes [provider]  levETIRAcetam (KEPPRA) 750 MG tablet Take 1 tablet (750 mg total) by mouth 2 (two) times daily. 12/09/19  Yes Mosetta Anis, MD  losartan-hydrochlorothiazide (HYZAAR) 50-12.5 MG tablet Take 1 tablet by mouth daily. 10/13/19  Yes Agyei, Caprice Kluver, MD  nicotine polacrilex (NICORETTE) 4 MG gum Take 1 each (4 mg total) by mouth as needed for smoking cessation. Patient not taking: Reported on 12/14/2019 12/09/19   Mosetta Anis, MD  varenicline (CHANTIX PAK) 0.5 MG X 11 & 1 MG X 42 tablet Take one 0.5 mg tablet by mouth once daily for 3 days, then increase to one 0.5 mg tablet twice daily for 4 days, then increase to one 1 mg tablet twice daily. Patient not taking: Reported on  12/14/2019 11/23/19   Mosetta Anis, MD    Allergies    Penicillins  Review of Systems   Review of Systems  Eyes: Positive for visual disturbance.  Neurological: Positive for seizures and headaches.  All other systems reviewed and are negative.   Physical Exam Updated Vital Signs BP (!) 160/95   Pulse 60   Temp 98.6 F (37 C)   Resp 19   Ht 6' 1" (1.854 m)   Wt 81.6 kg   SpO2 96%   BMI 23.75 kg/m   Physical Exam Vitals and nursing note reviewed.  Constitutional:      Appearance: Normal appearance.  HENT:     Head: Normocephalic and atraumatic.     Right Ear: External ear normal.     Left Ear: External ear normal.     Nose: Nose normal.     Mouth/Throat:     Mouth: Mucous membranes are moist.     Pharynx: Oropharynx is clear.  Eyes:     Extraocular Movements: Extraocular movements intact.     Conjunctiva/sclera: Conjunctivae normal.     Pupils: Pupils are equal, round, and reactive to  light.  Cardiovascular:     Rate and Rhythm: Normal rate and regular rhythm.     Pulses: Normal pulses.     Heart sounds: Normal heart sounds.  Pulmonary:     Effort: Pulmonary effort is normal.     Breath sounds: Normal breath sounds.  Abdominal:     General: Abdomen is flat. Bowel sounds are normal.     Palpations: Abdomen is soft.  Musculoskeletal:        General: Normal range of motion.     Cervical back: Normal range of motion and neck supple.  Skin:    General: Skin is warm.     Capillary Refill: Capillary refill takes less than 2 seconds.  Neurological:     General: No focal deficit present.     Mental Status: He is alert and oriented to person, place, and time.  Psychiatric:        Mood and Affect: Mood normal.        Behavior: Behavior normal.        Thought Content: Thought content normal.        Judgment: Judgment normal.     ED Results / Procedures / Treatments   Labs (all labs ordered are listed, but only abnormal results are displayed) Labs Reviewed  CBC - Abnormal; Notable for the following components:      Result Value   Platelets 407 (*)    All other components within normal limits  COMPREHENSIVE METABOLIC PANEL - Abnormal; Notable for the following components:   Glucose, Bld 109 (*)    All other components within normal limits  I-STAT CHEM 8, ED - Abnormal; Notable for the following components:   Sodium 146 (*)    Creatinine, Ser 0.60 (*)    Glucose, Bld 106 (*)    All other components within normal limits  SARS CORONAVIRUS 2 BY RT PCR (HOSPITAL ORDER, [facility])  PROTIME-INR  APTT  DIFFERENTIAL  CBG MONITORING, ED    EKG EKG Interpretation  Date/Time:  Monday December 14 2019 16:48:43 EDT Ventricular Rate:  52 PR Interval:    QRS Duration: 88 QT Interval:  430 QTC Calculation: 400 R Axis:   82 Text Interpretation: Sinus rhythm Anteroseptal infarct, age indeterminate No significant change since last tracing  Confirmed by Isla Pence [phone])  on 12/14/2019 5:21:57 PM   Radiology DG Chest 2 View  Result Date: 12/14/2019 CLINICAL DATA:  64 year old male with shortness of breath. Brain metastasis. History of smoking. EXAM: CHEST - 2 VIEW COMPARISON:  Chest radiograph dated 06/23/2018. FINDINGS: Background of emphysema. No focal consolidation, pleural effusion, or pneumothorax. The cardiac silhouette is within limits. No acute osseous pathology. IMPRESSION: No active cardiopulmonary disease. Electronically Signed   By: Anner Crete M.D.   On: 12/14/2019 19:53   CT HEAD WO CONTRAST  Result Date: 12/14/2019 CLINICAL DATA:  Seizure without known head injury. EXAM: CT HEAD WITHOUT CONTRAST TECHNIQUE: Contiguous axial images were obtained from the base of the skull through the vertex without intravenous contrast. COMPARISON:  12/27/2019 FINDINGS: Brain: The brainstem and cerebellum are normal. Left cerebral hemispheres normal. In the right posterior frontal and frontoparietal junction region, there is a pronounced vasogenic edema pattern suggesting the presence of an underlying tumor. That is not specifically clearly defined by this noncontrast head CT. Cerebritis is less likely. MRI with and without contrast recommended. No evidence of hemorrhage, hydrocephalus or extra-axial collection. No midline shift. Vascular: There is atherosclerotic calcification of the major vessels at the base of the brain. Skull: Negative Sinuses/Orbits: Clear/normal Other: None IMPRESSION: Prominent region of vasogenic edema in the right posterior frontal region and frontoparietal junction region worrisome for the presence of an occult underlying mass. Cerebritis is less likely. MRI with and without contrast recommended. Electronically Signed   By: Nelson Chimes M.D.   On: 12/14/2019 14:49   MR Brain W and Wo Contrast  Result Date: 12/14/2019 CLINICAL DATA:  Seizure. Abnormal CT head headache and blurred vision. EXAM: MRI HEAD WITHOUT  AND WITH CONTRAST TECHNIQUE: Multiplanar, multiecho pulse sequences of the brain and surrounding structures were obtained without and with intravenous contrast. CONTRAST:  55m GADAVIST GADOBUTROL 1 MMOL/ML IV SOLN COMPARISON:  CT head 12/14/2019 FINDINGS: Brain: Large area of edema in the right posterior frontal lobe. Within this edema there is a 13.5 mm enhancing mass lesion which is located peripherally. This shows fairly intense enhancement with small internal cystic changes. The mass is intra-axial but very close to the surface of the brain. No second lesion identified. No other edema Ventricle size normal.  No midline shift.  Negative for hemorrhage. Vascular: Normal arterial flow voids. Skull and upper cervical spine: No focal skeletal lesion. Sinuses/Orbits: Paranasal sinuses clear.  Negative orbit Other: None IMPRESSION: 13.5 mm enhancing mass in the right posterior frontal cortex over the convexity. Large amount of surrounding vasogenic edema. This is most likely a solitary metastasis. Primary brain tumor a less likely consideration. Electronically Signed   By: CFranchot GalloM.D.   On: 12/14/2019 19:29    Procedures Procedures (including critical care time)  Medications Ordered in ED Medications  sodium chloride flush (NS) 0.9 % injection 3 mL (3 mLs Intravenous Given 12/14/19 1700)  dexamethasone (DECADRON) injection 10 mg (10 mg Intravenous Given 12/14/19 1713)  gadobutrol (GADAVIST) 1 MMOL/ML injection 8 mL (8 mLs Intravenous Contrast Given 12/14/19 1901)    ED Course  I have reviewed the triage vital signs and the nursing notes.  Pertinent labs & imaging results that were available during my care of the patient were reviewed by me and considered in my medical decision making (see chart for details).    MDM Rules/Calculators/A&P                          Results of  CT and brain MRI discussed with Dr. Christella Noa.  Pt does have a right frontal lobe lesion with vasogenic edema which is probably  a met.  He is given decadron IV for his edema.   He does not have a known primary cancer source.  Dr. Christella Noa does not feel pt requires admission, but it is beneficial for pt to get admitted for a quicker work up as pt will need a resection of his tumor and treatment.  Pt d/w IMTS for admission for cancer work up.   Final Clinical Impression(s) / ED Diagnoses Final diagnoses:  Vasogenic brain edema ([place])  Lesion of right frontal lobe of brain    Rx / DC Orders ED Discharge Orders    None       Isla Pence, MD 12/14/19 2033

## 2019-12-14 NOTE — Consult Note (Signed)
Reason for Consult:brain tumor Referring Physician: Amond Speranza. is an 64 y.o. male.  HPI: whom initially presented to his pcp with complaints of facial tingling, and a headache. He associated this with weakness in the upper extremities. No abnormalities were found on his exam from 11/23/2019. He returned to his pcp on 12/09/2019 with new complaints of facial twitching on the left side. He again reported a headache just before the twitching. He stated he was unable to speak at that time though he tried. He was worried he might have seizures.   [facility] complaining of a severe headache and visual changes. He had been started on Keppra on the 28th.  CT and MRI today in the ED show a mass in the right frontal lobe along the periphery with significant surrounding edema. I was called for recommendations. He has a chest xray pending, long history 20yrs of tobacco use.  Past Medical History:  Diagnosis Date  . Bursitis of right hip   . Chest pain 08/12/2018  . Chronic cough   . Dependence on nicotine from cigarettes   . Essential hypertension 07/28/2018  . Viral illness 03/23/2019    Past Surgical History:  Procedure Laterality Date  . NO PAST SURGERIES      Family History  Problem Relation Age of Onset  . Heart disease Mother        CABG  . Kidney disease Sister   . Diabetes Brother   . Kidney disease Brother        KIDNEY TRANSPLANT  . Hypertension Sister   . Healthy Daughter   . Healthy Daughter   . Healthy Son     Social History:  reports that he has been smoking cigarettes. He started smoking about 49 years ago. He has a 24.00 pack-year smoking history. He has never used smokeless tobacco. He reports current alcohol use. He reports that he does not use drugs.  Allergies:  Allergies  Allergen Reactions  . Penicillins Rash    Medications: I have reviewed the patient's current medications.  Results for orders placed or performed  during the hospital encounter of 12/14/19 (from the past 48 hour(s))  Protime-INR     Status: None   Collection Time: 12/14/19  2:19 PM  Result Value Ref Range   Prothrombin Time 13.4 11.4 - 15.2 seconds   INR 1.1 0.8 - 1.2    Comment: (NOTE) INR goal varies based on device and disease states. Performed at [facility], [place] [address], [place], [place] [address]   APTT     Status: None   Collection Time: 12/14/19  2:19 PM  Result Value Ref Range   aPTT 35 24 - 36 seconds    Comment: Performed at [facility] [address], [place] [address]  CBC     Status: Abnormal   Collection Time: 12/14/19  2:19 PM  Result Value Ref Range   WBC 7.1 4.0 - 10.5 K/uL   RBC 4.66 4.22 - 5.81 MIL/uL   Hemoglobin 14.0 13.0 - 17.0 g/dL   HCT 41.8 39 - 52 %   MCV 89.7 80.0 - 100.0 fL   MCH 30.0 26.0 - 34.0 pg   MCHC 33.5 30.0 - 36.0 g/dL   RDW 14.3 11.5 - 15.5 %   Platelets 407 (H) 150 - 400 K/uL   nRBC 0.0 0.0 - 0.2 %    Comment: Performed at [facility]  [facility], [address], [place], [place] [address]  Differential     Status: None   Collection Time: 12/14/19  2:19 PM  Result Value Ref Range   Neutrophils Relative % 54 %   Neutro Abs 3.8 1.7 - 7.7 K/uL   Lymphocytes Relative 38 %   Lymphs Abs 2.7 0.7 - 4.0 K/uL   Monocytes Relative 7 %   Monocytes Absolute 0.5 0 - 1 K/uL   Eosinophils Relative 1 %   Eosinophils Absolute 0.0 0 - 0 K/uL   Basophils Relative 0 %   Basophils Absolute 0.0 0 - 0 K/uL   Immature Granulocytes 0 %   Abs Immature Granulocytes 0.02 0.00 - 0.07 K/uL    Comment: Performed at [facility] [address] [place], [place] [address]  Comprehensive metabolic panel     Status: Abnormal   Collection Time: 12/14/19  2:19 PM  Result Value Ref Range   Sodium 141 135 - 145 mmol/L   Potassium 4.0 3.5 - 5.1 mmol/L   Chloride 107 98 - 111 mmol/L   CO2 25 22 - 32 mmol/L   Glucose, Bld 109 (H) 70 - 99 mg/dL    Comment: Glucose reference range  applies only to samples taken after fasting for at least 8 hours.   BUN 9 8 - 23 mg/dL   Creatinine, Ser 0.73 0.61 - 1.24 mg/dL   Calcium 9.1 8.9 - 10.3 mg/dL   Total Protein 7.4 6.5 - 8.1 g/dL   Albumin 4.1 3.5 - 5.0 g/dL   AST 34 15 - 41 U/L   ALT 32 0 - 44 U/L   Alkaline Phosphatase 68 38 - 126 U/L   Total Bilirubin 0.7 0.3 - 1.2 mg/dL   GFR calc non Af Amer >60 >60 mL/min   GFR calc Af Amer >60 >60 mL/min   Anion gap 9 5 - 15    Comment: Performed at [facility], [place] [address], [place], [place] [address]  I-stat chem 8, ED     Status: Abnormal   Collection Time: 12/14/19  2:57 PM  Result Value Ref Range   Sodium 146 (H) 135 - 145 mmol/L   Potassium 3.7 3.5 - 5.1 mmol/L   Chloride 104 98 - 111 mmol/L   BUN 9 8 - 23 mg/dL   Creatinine, Ser 0.60 (L) 0.61 - 1.24 mg/dL   Glucose, Bld 106 (H) 70 - 99 mg/dL    Comment: Glucose reference range applies only to samples taken after fasting for at least 8 hours.   Calcium, Ion 1.21 1.15 - 1.40 mmol/L   TCO2 25 22 - 32 mmol/L   Hemoglobin 13.9 13.0 - 17.0 g/dL   HCT 41.0 39 - 52 %  SARS Coronavirus 2 by RT PCR (hospital order, performed in [facility] hospital lab) Nasopharyngeal Nasopharyngeal Swab     Status: None   Collection Time: 12/14/19  5:04 PM   Specimen: Nasopharyngeal Swab  Result Value Ref Range   SARS Coronavirus 2 NEGATIVE NEGATIVE    Comment: (NOTE) SARS-CoV-2 target nucleic acids are NOT DETECTED.  The SARS-CoV-2 RNA is generally detectable in upper and lower respiratory specimens during the acute phase of infection. The lowest concentration of SARS-CoV-2 viral copies this assay can detect is 250 copies / mL. A negative result does not preclude SARS-CoV-2 infection and should not be used as the sole basis for treatment or other patient management decisions.  A negative result may occur with improper  specimen collection / handling, submission of specimen other than nasopharyngeal swab, presence of viral  mutation(s) within the areas targeted by this assay, and inadequate number of viral copies (<250 copies / mL). A negative result must be combined with clinical observations, patient history, and epidemiological information.  Fact Sheet for Patients:   StrictlyIdeas.no  Fact Sheet for Healthcare Providers: BankingDealers.co.za  This test is not yet approved or  cleared by the [place] FDA and has been authorized for detection and/or diagnosis of SARS-CoV-2 by FDA under an Emergency Use Authorization (EUA).  This EUA will remain in effect (meaning this test can be used) for the duration of the COVID-19 declaration under Section 564(b)(1) of the Act, 21 U.S.C. section 360bbb-3(b)(1), unless the authorization is terminated or revoked sooner.  Performed at [facility], [place] [address] [place], [place] [address]   CBG monitoring, ED     Status: None   Collection Time: 12/14/19  5:11 PM  Result Value Ref Range   Glucose-Capillary 96 70 - 99 mg/dL    Comment: Glucose reference range applies only to samples taken after fasting for at least 8 hours.    CT HEAD WO CONTRAST  Result Date: 12/14/2019 CLINICAL DATA:  Seizure without known head injury. EXAM: CT HEAD WITHOUT CONTRAST TECHNIQUE: Contiguous axial images were obtained from the base of the skull through the vertex without intravenous contrast. COMPARISON:  12/27/2019 FINDINGS: Brain: The brainstem and cerebellum are normal. Left cerebral hemispheres normal. In the right posterior frontal and frontoparietal junction region, there is a pronounced vasogenic edema pattern suggesting the presence of an underlying tumor. That is not specifically clearly defined by this noncontrast head CT. Cerebritis is less likely. MRI with and without contrast recommended. No evidence of hemorrhage, hydrocephalus or extra-axial collection. No midline shift. Vascular: There is atherosclerotic  calcification of the major vessels at the base of the brain. Skull: Negative Sinuses/Orbits: Clear/normal Other: None IMPRESSION: Prominent region of vasogenic edema in the right posterior frontal region and frontoparietal junction region worrisome for the presence of an occult underlying mass. Cerebritis is less likely. MRI with and without contrast recommended. Electronically Signed   By: Nelson Chimes M.D.   On: 12/14/2019 14:49   MR Brain W and Wo Contrast  Result Date: 12/14/2019 CLINICAL DATA:  Seizure. Abnormal CT head headache and blurred vision. EXAM: MRI HEAD WITHOUT AND WITH CONTRAST TECHNIQUE: Multiplanar, multiecho pulse sequences of the brain and surrounding structures were obtained without and with intravenous contrast. CONTRAST:  23mL GADAVIST GADOBUTROL 1 MMOL/ML IV SOLN COMPARISON:  CT head 12/14/2019 FINDINGS: Brain: Large area of edema in the right posterior frontal lobe. Within this edema there is a 13.5 mm enhancing mass lesion which is located peripherally. This shows fairly intense enhancement with small internal cystic changes. The mass is intra-axial but very close to the surface of the brain. No second lesion identified. No other edema Ventricle size normal.  No midline shift.  Negative for hemorrhage. Vascular: Normal arterial flow voids. Skull and upper cervical spine: No focal skeletal lesion. Sinuses/Orbits: Paranasal sinuses clear.  Negative orbit Other: None IMPRESSION: 13.5 mm enhancing mass in the right posterior frontal cortex over the convexity. Large amount of surrounding vasogenic edema. This is most likely a solitary metastasis. Primary brain tumor a less likely consideration. Electronically Signed   By: Franchot Gallo M.D.   On: 12/14/2019 19:29    Review of Systems  Constitutional: Negative.    Blood pressure (!) 160/95, pulse 60,  temperature 98.6 F (37 C), resp. rate 19, height 6\' 1"  (1.854 m), weight 81.6 kg, SpO2 96 %. Physical Exam HENT:     Head:  Normocephalic and atraumatic.     Right Ear: Tympanic membrane, ear canal and external ear normal.     Left Ear: Tympanic membrane, ear canal and external ear normal.     Nose: Nose normal.     Mouth/Throat:     Mouth: Mucous membranes are moist.     Pharynx: Oropharynx is clear.  Eyes:     Extraocular Movements: Extraocular movements intact.     Conjunctiva/sclera: Conjunctivae normal.     Pupils: Pupils are equal, round, and reactive to light.  Cardiovascular:     Rate and Rhythm: Normal rate and regular rhythm.     Pulses: Normal pulses.  Pulmonary:     Effort: Pulmonary effort is normal.  Abdominal:     General: Abdomen is flat.     Palpations: Abdomen is soft.  Musculoskeletal:        General: Normal range of motion.     Cervical back: Normal range of motion and neck supple.  Skin:    General: Skin is warm and dry.  Neurological:     Mental Status: He is alert and oriented to person, place, and time.     Cranial Nerves: Cranial nerves are intact.     Sensory: Sensation is intact. No sensory deficit.     Motor: Motor function is intact.     Coordination: Coordination is intact. Coordination normal. Finger-Nose-Finger Test and Heel to [facility] Test normal.     Deep Tendon Reflexes: Reflexes are normal and symmetric. Babinski sign absent on the right side. Babinski sign absent on the left side.     Comments: Pronounced left sided drift on Barre testing  Psychiatric:        Mood and Affect: Mood normal.        Behavior: Behavior normal.        Thought Content: Thought content normal.        Judgment: Judgment normal.     Assessment/Plan: Probable metastatic lesion in right frontal lobe. Will need resection. He does not need to be admitted from my standpoint. On discharge if he is discharged tonight he will need decadron 4mg  po bid. I will see him in the office this week to further discuss operative plans, and oncologic workup.   Ashok Pall 12/14/2019, 7:42 PM

## 2019-12-14 NOTE — Telephone Encounter (Signed)
Requesting to speak with a nurse about headache. Please call pt back.

## 2019-12-14 NOTE — H&P (Addendum)
Date: 12/15/2019               Patient Name:  Jared Shea. MRN: [id number]  DOB: 05/15/1955 Age / Sex: 64 y.o., male   PCP: Jean Rosenthal, MD         Medical Service: Internal Medicine Teaching Service         Attending Physician: Dr. Evette Doffing, Mallie Mussel, *    First Contact: Dr. Collene Gobble Pager: [phone]  Second Contact: Dr. Truman Hayward Pager: [phone]       After Hours (After 5p/  First Contact Pager: [phone]  weekends / holidays): Second Contact Pager: [phone]   Chief Complaint: headache  History of Present Illness:   Jared Shea. Is a 64yo male with PMHx of childhood seizures, tobacco use disorder, and essential HTN presenting with worsening headaches at home over the past month. He describes his headaches as a pressure located bilaterally on the top of his head. The headache began gradually, have remained constant since onset, and are mild. He takes Ibuprofen at home which improves his pain.   He was seen 12/09/19 in the internal medicine center and was having symptoms concerning for focal seizure with twitching along the left lip followed by confusion and fatigue. He was started on Keppra 750mg  BID at that time and has taken this regularly.  He hasn't had any more lip twitching or confusion since starting Keppra. He denies nausea, vomiting. He states his eyesight is not as good as it used to be but denies any sudden changes in vision. He has had 15 lb weight loss since last fall. He endorses chronic cough that does seem slightly worse recently. No hemoptysis, denies fever or chills, abdominal pain, dizziness, weakness, or numbness.   Social:   He lives in [place] and used to drive trucks for a living.  He has smoked 1ppd since age 89 and drinks about 2-3 beers per day. He states he previously used crack cocaine but denies any recent cocaine use or other drug use.   Family History:  Family History  Problem Relation Age of Onset  . Heart disease Mother        CABG  .  Kidney disease Sister   . Diabetes Brother   . Kidney disease Brother        KIDNEY TRANSPLANT  . Hypertension Sister   . Healthy Daughter   . Healthy Daughter   . Healthy Son     Meds:  Current Meds  Medication Sig  . ibuprofen (ADVIL) 200 MG tablet Take 200 mg by mouth every 6 (six) hours as needed.  Marland Kitchen ibuprofen (ADVIL) 400 MG tablet Take 400 mg by mouth every 6 (six) hours as needed for headache.  . levETIRAcetam (KEPPRA) 750 MG tablet Take 1 tablet (750 mg total) by mouth 2 (two) times daily.  Marland Kitchen losartan-hydrochlorothiazide (HYZAAR) 50-12.5 MG tablet Take 1 tablet by mouth daily.     Allergies: Allergies as of 12/14/2019 - Review Complete 12/14/2019  Allergen Reaction Noted  . Penicillins Rash 06/23/2018   Past Medical History:  Diagnosis Date  . Bursitis of right hip   . Chest pain 08/12/2018  . Chronic cough   . Dependence on nicotine from cigarettes   . Essential hypertension 07/28/2018  . Viral illness 03/23/2019    Review of Systems: A complete ROS was negative except as per HPI.   Physical Exam: Blood pressure (!) 147/110, pulse 65, temperature 98.6 F (37 C), resp. rate 16,  height 6\' 1"  (1.854 m), weight 81.6 kg, SpO2 100 %. General: Patient appears well. No acute distress. Eyes: Sclera non-icteric. No conjunctival injection.  HENT: Neck is supple. No nasal discharge. Respiratory: End-expiratory rhonchi present bilaterally with no wheezing or rales. Breath sounds equal bilaterally.  Cardiovascular: Rate is bradycardic in the 50's with regular rhythm. No murmurs, rubs, or gallops. No lower extremity edema. Distal pulses 2+ in all four extremities.  Neurological: Cranial nerves II-XII intact. Sensation to light touch intact throughout.  Musculoskeletal: Motor strength is 5/5 in all four extremities with normal bulk and tone.  Skin: No lesions. No rashes.  Psych: Normal affect. Normal tone of voice.   EKG: personally reviewed my interpretation is sinus  bradycardia at a rate of ~52bpm. There are T wave inversions in V1, V2, aVR and aVL. No ST segment elevations or depressions. Normal PR intervals.   CT Head without contrast: Prominent region of vasogenic edema in the right posterior frontal region and frontoparietal junction region worrisome for the presence of an occult underlying mass.   Electronically Signed   By: Nelson Chimes M.D.   On: 12/14/2019 14:49  MR Brain w/ and w/o contrast: 13.5 mm enhancing mass in the right posterior frontal cortex over the convexity. Large amount of surrounding vasogenic edema. This is most likely a solitary metastasis. Primary brain tumor a less likely Consideration.  Electronically Signed   By: Franchot Gallo M.D.   On: 12/14/2019 19:29  CXR: personally reviewed my interpretation is no active cardiopulmonary disease.   CT chest, abdomen, pelvis w/ contrast:  - there is a 75mm hypodense focus in the inferior left hilum, likely enlarge hilar lymph node. PE less likely - There is mediastinal adenopathy measuring up to 2cm in short axis in the subcarinal region. Mildly enlarged lymph node in the AP window measures 29mm.  - 74mm LUL pulmonary nodule  - mildly enlarged peripancreatic lymph node measuring 32mm  Electronically Signed   By: Anner Crete M.D.   On: 12/14/2019 21:40  Assessment & Plan by Problem: Active Problems:   Tobacco use disorder   Focal seizure ([place])   Mass of frontal lobe   Pulmonary nodule  13.78mm enhancing mass lesion in the right posterior frontal cortex MR brain confirms the presence of a 13.66mm enhancing mass in the right posterior frontal cortex over the convexity with a large amount of surrounding vasogenic edema, more likely due to metastasis than primary brain tumor. Patient has experienced worsening daily headache over the past month and chart review shows about a 10lb weight loss since last August. He was seen in the clinic over the past week and was started on  Keppra 750mg  BID due to concern for seizures, with left lip twitching followed by confusion and fatigue although has not had these symptoms since starting Keppra.   - Patient given decadron 10mg  IV loading dose  - Will give decadron 6mg /day in divided dosing (for 7 days, then taper)  - Continue Keppra 750mg  BID  - Will check BMP, Mg2+, HIV antibody - Ordered neuro checks  - Will get PT/OT assessment - Patient will need follow up as an outpatient with neurosurgery to discuss possible resection of mass. - Will hold off on EEG given resolution of lip twitching/AMS on Keppra.  15mm LUL pulmonary nodule with lymphadenopathy CT chest, abdomen, pelvis with contrast demonstrated a 78mm LUL pulmonary nodule with a 17mm hypodense focus in the inferior left hilum and mediastinal adenopathy measuring up to 2cm in  short axis in the subcarinal region. A mildly enlarged peripancreatic lymph node measuring 82mm was also noted. Findings may represent primary tumor site with metastasis to the brain detailed above. Radiology notes that 82mm hypodense focus in inferior left hilum could also be due to PE but this is clinically unlikely given patient is bradycardic and afebrile without lower extremity swelling/discoloration/pain, tachypnea, CP or SOB.  - Patient will need follow up with pulmonology for bronchoscopy with tissue sampling to r/o malignancy - Patient made NPO in case he is able to get bronchoscopy this hospital stay  Tobacco Use Disorder Patient has >24 pack year smoking history. Interested in smoking cessation.   - Will give nicotine 21mg  patch in hospital  - Consider further smoking cessation counseling prior to discharge  Essential HTN Blood pressures today slightly elevated in the [id number]-471 systolic. BP has historically been well controlled on Losartan-HCTZ 50-12.5mg  daily at home.   - Continue home Losartan-HCTZ 50-12.5mg  daily.   Diet: NPO VTE: Enoxaparin injection 40mg  SQ daily Code: Full  code  Dispo: Admit patient to Observation with expected length of stay less than 2 midnights.  Signed: Jeralyn Bennett, MD 12/15/2019, 1:09 AM Pager: [phone]

## 2019-12-14 NOTE — ED Notes (Signed)
Pt transported to MRI 

## 2019-12-14 NOTE — H&P (View-Only) (Signed)
Reason for Consult:brain tumor Referring Physician: Hilmar Moldovan. is an 64 y.o. male.  HPI: whom initially presented to his pcp with complaints of facial tingling, and a headache. He associated this with weakness in the upper extremities. No abnormalities were found on his exam from 11/23/2019. He returned to his pcp on 12/09/2019 with new complaints of facial twitching on the left side. He again reported a headache just before the twitching. He stated he was unable to speak at that time though he tried. He was worried he might have seizures.   [facility] complaining of a severe headache and visual changes. He had been started on Keppra on the 28th.  CT and MRI today in the ED show a mass in the right frontal lobe along the periphery with significant surrounding edema. I was called for recommendations. He has a chest xray pending, long history 84yrs of tobacco use.  Past Medical History:  Diagnosis Date  . Bursitis of right hip   . Chest pain 08/12/2018  . Chronic cough   . Dependence on nicotine from cigarettes   . Essential hypertension 07/28/2018  . Viral illness 03/23/2019    Past Surgical History:  Procedure Laterality Date  . NO PAST SURGERIES      Family History  Problem Relation Age of Onset  . Heart disease Mother        CABG  . Kidney disease Sister   . Diabetes Brother   . Kidney disease Brother        KIDNEY TRANSPLANT  . Hypertension Sister   . Healthy Daughter   . Healthy Daughter   . Healthy Son     Social History:  reports that he has been smoking cigarettes. He started smoking about 49 years ago. He has a 24.00 pack-year smoking history. He has never used smokeless tobacco. He reports current alcohol use. He reports that he does not use drugs.  Allergies:  Allergies  Allergen Reactions  . Penicillins Rash    Medications: I have reviewed the patient's current medications.  Results for orders placed or performed  during the hospital encounter of 12/14/19 (from the past 48 hour(s))  Protime-INR     Status: None   Collection Time: 12/14/19  2:19 PM  Result Value Ref Range   Prothrombin Time 13.4 11.4 - 15.2 seconds   INR 1.1 0.8 - 1.2    Comment: (NOTE) INR goal varies based on device and disease states. Performed at [facility], [place] [address], [place], [place] [address]   APTT     Status: None   Collection Time: 12/14/19  2:19 PM  Result Value Ref Range   aPTT 35 24 - 36 seconds    Comment: Performed at [place] [facility] [address], [place] [address]  CBC     Status: Abnormal   Collection Time: 12/14/19  2:19 PM  Result Value Ref Range   WBC 7.1 4.0 - 10.5 K/uL   RBC 4.66 4.22 - 5.81 MIL/uL   Hemoglobin 14.0 13.0 - 17.0 g/dL   HCT 41.8 39 - 52 %   MCV 89.7 80.0 - 100.0 fL   MCH 30.0 26.0 - 34.0 pg   MCHC 33.5 30.0 - 36.0 g/dL   RDW 14.3 11.5 - 15.5 %   Platelets 407 (H) 150 - 400 K/uL   nRBC 0.0 0.0 - 0.2 %    Comment: Performed at [facility]  [facility], [address] [place], [place] [address]  Differential     Status: None   Collection Time: 12/14/19  2:19 PM  Result Value Ref Range   Neutrophils Relative % 54 %   Neutro Abs 3.8 1.7 - 7.7 K/uL   Lymphocytes Relative 38 %   Lymphs Abs 2.7 0.7 - 4.0 K/uL   Monocytes Relative 7 %   Monocytes Absolute 0.5 0 - 1 K/uL   Eosinophils Relative 1 %   Eosinophils Absolute 0.0 0 - 0 K/uL   Basophils Relative 0 %   Basophils Absolute 0.0 0 - 0 K/uL   Immature Granulocytes 0 %   Abs Immature Granulocytes 0.02 0.00 - 0.07 K/uL    Comment: Performed at [facility] [address], [place], [place] [address]  Comprehensive metabolic panel     Status: Abnormal   Collection Time: 12/14/19  2:19 PM  Result Value Ref Range   Sodium 141 135 - 145 mmol/L   Potassium 4.0 3.5 - 5.1 mmol/L   Chloride 107 98 - 111 mmol/L   CO2 25 22 - 32 mmol/L   Glucose, Bld 109 (H) 70 - 99 mg/dL    Comment: Glucose reference range  applies only to samples taken after fasting for at least 8 hours.   BUN 9 8 - 23 mg/dL   Creatinine, Ser 0.73 0.61 - 1.24 mg/dL   Calcium 9.1 8.9 - 10.3 mg/dL   Total Protein 7.4 6.5 - 8.1 g/dL   Albumin 4.1 3.5 - 5.0 g/dL   AST 34 15 - 41 U/L   ALT 32 0 - 44 U/L   Alkaline Phosphatase 68 38 - 126 U/L   Total Bilirubin 0.7 0.3 - 1.2 mg/dL   GFR calc non Af Amer >60 >60 mL/min   GFR calc Af Amer >60 >60 mL/min   Anion gap 9 5 - 15    Comment: Performed at [facility], [place] [address] [place], [place] [address]  I-stat chem 8, ED     Status: Abnormal   Collection Time: 12/14/19  2:57 PM  Result Value Ref Range   Sodium 146 (H) 135 - 145 mmol/L   Potassium 3.7 3.5 - 5.1 mmol/L   Chloride 104 98 - 111 mmol/L   BUN 9 8 - 23 mg/dL   Creatinine, Ser 0.60 (L) 0.61 - 1.24 mg/dL   Glucose, Bld 106 (H) 70 - 99 mg/dL    Comment: Glucose reference range applies only to samples taken after fasting for at least 8 hours.   Calcium, Ion 1.21 1.15 - 1.40 mmol/L   TCO2 25 22 - 32 mmol/L   Hemoglobin 13.9 13.0 - 17.0 g/dL   HCT 41.0 39 - 52 %  SARS Coronavirus 2 by RT PCR (hospital order, performed in [facility] hospital lab) Nasopharyngeal Nasopharyngeal Swab     Status: None   Collection Time: 12/14/19  5:04 PM   Specimen: Nasopharyngeal Swab  Result Value Ref Range   SARS Coronavirus 2 NEGATIVE NEGATIVE    Comment: (NOTE) SARS-CoV-2 target nucleic acids are NOT DETECTED.  The SARS-CoV-2 RNA is generally detectable in upper and lower respiratory specimens during the acute phase of infection. The lowest concentration of SARS-CoV-2 viral copies this assay can detect is 250 copies / mL. A negative result does not preclude SARS-CoV-2 infection and should not be used as the sole basis for treatment or other patient management decisions.  A negative result may occur with improper  specimen collection / handling, submission of specimen other than nasopharyngeal swab, presence of viral  mutation(s) within the areas targeted by this assay, and inadequate number of viral copies (<250 copies / mL). A negative result must be combined with clinical observations, patient history, and epidemiological information.  Fact Sheet for Patients:   StrictlyIdeas.no  Fact Sheet for Healthcare Providers: BankingDealers.co.za  This test is not yet approved or  cleared by the [place] FDA and has been authorized for detection and/or diagnosis of SARS-CoV-2 by FDA under an Emergency Use Authorization (EUA).  This EUA will remain in effect (meaning this test can be used) for the duration of the COVID-19 declaration under Section 564(b)(1) of the Act, 21 U.S.C. section 360bbb-3(b)(1), unless the authorization is terminated or revoked sooner.  Performed at [facility], [place] [address] [place], [place] [address]   CBG monitoring, ED     Status: None   Collection Time: 12/14/19  5:11 PM  Result Value Ref Range   Glucose-Capillary 96 70 - 99 mg/dL    Comment: Glucose reference range applies only to samples taken after fasting for at least 8 hours.    CT HEAD WO CONTRAST  Result Date: 12/14/2019 CLINICAL DATA:  Seizure without known head injury. EXAM: CT HEAD WITHOUT CONTRAST TECHNIQUE: Contiguous axial images were obtained from the base of the skull through the vertex without intravenous contrast. COMPARISON:  12/27/2019 FINDINGS: Brain: The brainstem and cerebellum are normal. Left cerebral hemispheres normal. In the right posterior frontal and frontoparietal junction region, there is a pronounced vasogenic edema pattern suggesting the presence of an underlying tumor. That is not specifically clearly defined by this noncontrast head CT. Cerebritis is less likely. MRI with and without contrast recommended. No evidence of hemorrhage, hydrocephalus or extra-axial collection. No midline shift. Vascular: There is atherosclerotic  calcification of the major vessels at the base of the brain. Skull: Negative Sinuses/Orbits: Clear/normal Other: None IMPRESSION: Prominent region of vasogenic edema in the right posterior frontal region and frontoparietal junction region worrisome for the presence of an occult underlying mass. Cerebritis is less likely. MRI with and without contrast recommended. Electronically Signed   By: Nelson Chimes M.D.   On: 12/14/2019 14:49   MR Brain W and Wo Contrast  Result Date: 12/14/2019 CLINICAL DATA:  Seizure. Abnormal CT head headache and blurred vision. EXAM: MRI HEAD WITHOUT AND WITH CONTRAST TECHNIQUE: Multiplanar, multiecho pulse sequences of the brain and surrounding structures were obtained without and with intravenous contrast. CONTRAST:  21mL GADAVIST GADOBUTROL 1 MMOL/ML IV SOLN COMPARISON:  CT head 12/14/2019 FINDINGS: Brain: Large area of edema in the right posterior frontal lobe. Within this edema there is a 13.5 mm enhancing mass lesion which is located peripherally. This shows fairly intense enhancement with small internal cystic changes. The mass is intra-axial but very close to the surface of the brain. No second lesion identified. No other edema Ventricle size normal.  No midline shift.  Negative for hemorrhage. Vascular: Normal arterial flow voids. Skull and upper cervical spine: No focal skeletal lesion. Sinuses/Orbits: Paranasal sinuses clear.  Negative orbit Other: None IMPRESSION: 13.5 mm enhancing mass in the right posterior frontal cortex over the convexity. Large amount of surrounding vasogenic edema. This is most likely a solitary metastasis. Primary brain tumor a less likely consideration. Electronically Signed   By: Franchot Gallo M.D.   On: 12/14/2019 19:29    Review of Systems  Constitutional: Negative.    Blood pressure (!) 160/95, pulse 60,  temperature 98.6 F (37 C), resp. rate 19, height 6\' 1"  (1.854 m), weight 81.6 kg, SpO2 96 %. Physical Exam HENT:     Head:  Normocephalic and atraumatic.     Right Ear: Tympanic membrane, ear canal and external ear normal.     Left Ear: Tympanic membrane, ear canal and external ear normal.     Nose: Nose normal.     Mouth/Throat:     Mouth: Mucous membranes are moist.     Pharynx: Oropharynx is clear.  Eyes:     Extraocular Movements: Extraocular movements intact.     Conjunctiva/sclera: Conjunctivae normal.     Pupils: Pupils are equal, round, and reactive to light.  Cardiovascular:     Rate and Rhythm: Normal rate and regular rhythm.     Pulses: Normal pulses.  Pulmonary:     Effort: Pulmonary effort is normal.  Abdominal:     General: Abdomen is flat.     Palpations: Abdomen is soft.  Musculoskeletal:        General: Normal range of motion.     Cervical back: Normal range of motion and neck supple.  Skin:    General: Skin is warm and dry.  Neurological:     Mental Status: He is alert and oriented to person, place, and time.     Cranial Nerves: Cranial nerves are intact.     Sensory: Sensation is intact. No sensory deficit.     Motor: Motor function is intact.     Coordination: Coordination is intact. Coordination normal. Finger-Nose-Finger Test and Heel to [facility] Test normal.     Deep Tendon Reflexes: Reflexes are normal and symmetric. Babinski sign absent on the right side. Babinski sign absent on the left side.     Comments: Pronounced left sided drift on Barre testing  Psychiatric:        Mood and Affect: Mood normal.        Behavior: Behavior normal.        Thought Content: Thought content normal.        Judgment: Judgment normal.     Assessment/Plan: Probable metastatic lesion in right frontal lobe. Will need resection. He does not need to be admitted from my standpoint. On discharge if he is discharged tonight he will need decadron 4mg  po bid. I will see him in the office this week to further discuss operative plans, and oncologic workup.   Ashok Pall 12/14/2019, 7:42 PM

## 2019-12-14 NOTE — Telephone Encounter (Signed)
Scheduled for 1515 on yellow team schedule. Pt states he is having a H/A and some slight vision changes, wants to f/u from last visit. He is cautioned if he has gait changes, severe changes in vision, N&V, speech changes to call 911. He is agreeable

## 2019-12-14 NOTE — ED Triage Notes (Addendum)
Pt sent by PCP for eval of seizures. Reports last seizure 1 wk ago. States he has been having seizures since he got the Covid vaccines 3 mo's ago. Has been taking Keppra for about a week now, no missed doses. Reports a HA today, reported blurred vision earlier this morning, but it is now resolving per pt. LSN 11pm last night

## 2019-12-15 ENCOUNTER — Telehealth: Payer: Self-pay | Admitting: Internal Medicine

## 2019-12-15 DIAGNOSIS — R911 Solitary pulmonary nodule: Secondary | ICD-10-CM | POA: Diagnosis present

## 2019-12-15 DIAGNOSIS — G936 Cerebral edema: Secondary | ICD-10-CM | POA: Diagnosis not present

## 2019-12-15 LAB — BASIC METABOLIC PANEL
Anion gap: 7 (ref 5–15)
BUN: 16 mg/dL (ref 8–23)
CO2: 23 mmol/L (ref 22–32)
Calcium: 9.4 mg/dL (ref 8.9–10.3)
Chloride: 107 mmol/L (ref 98–111)
Creatinine, Ser: 0.69 mg/dL (ref 0.61–1.24)
GFR calc Af Amer: >60 mL/min (ref >60)
GFR calc non Af Amer: >60 mL/min (ref >60)
Glucose, Bld: 131 mg/dL — ABNORMAL HIGH (ref 70–99)
Potassium: 4.1 mmol/L (ref 3.5–5.1)
Sodium: 137 mmol/L (ref 135–145)

## 2019-12-15 LAB — MAGNESIUM: Magnesium: 2.1 mg/dL (ref 1.7–2.4)

## 2019-12-15 LAB — HIV ANTIBODY (ROUTINE TESTING W REFLEX): HIV Screen 4th Generation wRfx: NONREACTIVE

## 2019-12-15 MED ORDER — DEXAMETHASONE 4 MG PO TABS: 4.0000 mg | ORAL_TABLET | Freq: Two times a day (BID) | ORAL | 0 refills | Status: DC

## 2019-12-15 MED FILL — DEXAMETHASONE 4 MG TABLET: 4 | 14 days supply | Qty: 28 | Fill #0

## 2019-12-15 NOTE — Telephone Encounter (Signed)
Spoke to pt when he called back, he states he needs someone to pick up his meds now, suggested [facility], he states [facility] is [facility], [facility] and latoya is calling pt. He also would like some [facility] patches [facility] to [facility]

## 2019-12-15 NOTE — ED Notes (Addendum)
Pt is refusing lab draws and states he is leaving at [place] to patient that I can get the blood from his IV so that he doesn't have to be stuck with a needle and pt is still refusing blood work to be done. Pt states " There is no need for you to get it because I am leaving at 8am."

## 2019-12-15 NOTE — Telephone Encounter (Signed)
Refill request- Pt requesting a call back appointments given during D/C as well .  levETIRAcetam (KEPPRA) tablet 750 mg   [place], [place]

## 2019-12-15 NOTE — Progress Notes (Signed)
PT eval complete- no skilled needs or equipment indicated. OK for DC once considered medically ready per MD. Formal note pending.  Windell Norfolk, DPT, PN1   Supplemental Physical Therapist [facility]    Pager [phone] Acute Rehab Office [phone]

## 2019-12-15 NOTE — Telephone Encounter (Signed)
Return pt's call - pt asked if any rxs were sent to the pharmacy. Informed, according to his chart, Keppra and nicotine gum were sent to [facility] on 7/28; to call the pharmacy. Then he stated he supposed to have brain surgery and wanted to know when. I asked if he had his discharge papers, stated not with him. According to the discharge summary,I told pt he has an appt with  Ashok Pall, MD. Go on 12/17/2019.   Specialty: Neurosurgery Why: Go to clinic 9:00 AM Contact information: [address] [place] [address] [phone] Also appt info was left on pt's vm per his request.

## 2019-12-15 NOTE — Progress Notes (Signed)
   Subjective:  Patient seen at bedside this AM. Reports he is ready to leave hospital. Discussed with patient MRI findings and next steps. Says his HA has improved. Denies CP, SOB.  Objective:  Vital signs in last 24 hours: Vitals:   12/15/19 0700 12/15/19 0706 12/15/19 1033 12/15/19 1033  BP: 135/83 135/83  (!) 154/87  Pulse: 78 78 61 61  Resp:  18  18  Temp:  98 F (36.7 C)    TempSrc:  Oral    SpO2: 99% 99% 100% 100%  Weight:      Height:       Physical Exam: General: Resting comfortably in bed, no acute distress CV: Normal rate, regular rhythm. No m/r/g  Assessment/Plan:  Principal Problem:   Mass of frontal lobe Active Problems:   Tobacco use disorder   Focal seizure ([place])   Pulmonary nodule  Patient is 64yo male with focal seizure, tobacco use disorder, essential HTN who presents to ED with worsening HA, found to have enhancing mass on MRI brain with vasogenic edema, concerning for metastatic disease.   #Frontal cortex enhancing lesion with vasogenic edema #58m pulmonary nodule, LAD Patient presented with worsening headache x362moith recent development of focal seizures. Was scheduled to have outpatient MRI, but upon admission to ED was able to have imaging done here. MRI revealed 1350mnhancing lesion in right posterior frontal cortex with extensive vasogenic edema. CT chest, abdomen, pelvis showed 4mm44mL pulmonary nodule with 12mm54modense focus in the inferior left hilum, mediastinal adenopathy, and enlarged peripancreatic lymph node. Given extensive vasogenic edema on MRI, it is likely the lesion is metastasis. Patient does have history of smoking, so pulmonary nodule could be  primary site. He denies any tachypnea, CP, or SOB. Neurosurgery consulted. They have scheduled to see the patient in clinic later this week with plans for resection of the met. Patient will need further outpatient work-up, including PET scan and possible direct biopsy of nodule. This AM,  patient reports HA has resolved and he is ready for d/c. Patient given IV Decadron in ED, plan to continue Decadron po '4mg'$  BID at discharge. -Neurosurgery appointment with Dr. CabbeChristella Noa/5 -Decadron '4mg'$  BID  #Focal seizure Patient initially seen in clinic last week with lip twitching, concerning for focal seizure with prodromal symptoms. He was started on Keppra at the time. After MRI brain, seizures most likely 2/2 brain met. Plan for patient to continue Keppra at this time. Discussed with patient that he should not be driving at this time. -Keppra '750mg'$  BID   #Tobacco Use Disorder Patient has 24 pack year history. States he would like to quit. Discharging with nicotine gum. Encouraged patient to attend [facility] to discuss any further needs. -Nicotine gum   #Essential HTN BP stable since admission. Plan to discharge patient on home medication of Hyzaar. [facility]  Prior to Admission Living Arrangement: home Anticipated Discharge Location: home Barriers to Discharge: none Dispo: Anticipated discharge in approximately 0 day(s).   BraswSanjuan Dame8/07/2019, 10:36 AM Pager: [phone] 5pm on weekdays and 1pm on weekends: On Call pager [phone]

## 2019-12-15 NOTE — Discharge Summary (Addendum)
Name: Jared Shea. MRN: [id number] DOB: Apr 27, 1956 64 y.o. PCP: Jared Rosenthal, MD  Date of Admission: 12/14/2019  2:08 PM Date of Discharge:  12/15/2019 Attending Physician: Jared Shea, *  Discharge Diagnosis: 1. 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 Vasogenic cerebral edema associated with the brain mass 3. Seizure disorder  Discharge Medications: Allergies as of 12/15/2019       Reactions   Penicillins Rash        Medication List     STOP taking these medications    varenicline 0.5 MG X 11 & 1 MG X 42 tablet Commonly known as: CHANTIX PAK       TAKE these medications    dexamethasone 4 MG tablet Commonly known as: DECADRON Take 1 tablet (4 mg total) by mouth 2 (two) times daily for 14 days.   ibuprofen 400 MG tablet Commonly known as: ADVIL Take 400 mg by mouth every 6 (six) hours as needed for headache. What changed: Another medication with the same name was removed. Continue taking this medication, and follow the directions you see here.   levETIRAcetam 750 MG tablet Commonly known as: KEPPRA Take 1 tablet (750 mg total) by mouth 2 (two) times daily.   losartan-hydrochlorothiazide 50-12.5 MG tablet Commonly known as: HYZAAR Take 1 tablet by mouth daily.   nicotine polacrilex 4 MG gum Commonly known as: Nicorette Take 1 each (4 mg total) by mouth as needed for smoking cessation.        Disposition and follow-up:   [name] was discharged from [facility] in Stable condition.  At the hospital follow up visit please address:  1.  Patient will follow-up with Dr. Christella Shea on 8/5 for further evaluation and management of the lesion on the R frontal lobe and vasogenic edema. Patient will likely need to see oncology, pulmonology for metastatic malignancy as well. He will also follow-up with [facility] on 8/15 for management of HTN, smoking cessation.   2.  Labs / imaging needed at time  of follow-up: PET scan  3.  Pending labs/ test needing follow-up: n/a  Follow-up Appointments:  Follow-up Information     Jared Pall, MD. Go on 12/17/2019.   Specialty: Neurosurgery Why: Go to clinic 9:00 AM Contact information: [address] [place] [address] [phone]         Jared Rosenthal, MD. Go on 12/30/2019.   Specialty: Internal Medicine Why: Go to clinic 1:45 PM Contact information: [address] [place] [address] [place] Hospital Course by problem list: 1. R frontal cortex lesion w/ vasogenic edema, LUL pulmonary nodule, LAD Patient presented with worsening HA x79mo and recent development of focal seizures. Upon arrival in ED, imaging revealed lesion in R frontal cortex, vasogenic edema, LUL pulmonary nodule. Neurosurgery consulted, no emergent intervention necessary. Will follow-up outpatient 8/5 with plan for surgical resection of lesion. He will likely need to follow-up with oncology/pulmonary for PET scan, possible bronchoscopy/biopsy. Patient discharged on Decadron po 4mg  BID.   2. Focal seizures Patient seen in clinic last week with lip twitching. Thought to be focal seizure and started on Keppra. Upon results of MRI, will continue on Keppra. Discussed with patient that he should not be driving at this time.  3. Tobacco use disorder Patient has 24 pack yr history, but would like  to quit. Discharging w/ nicotine gum and will follow-up outpatient with [facility].  4. Essential HTN BP stable on admission, will continue Hyzaar upon discharge. Follow-up with [facility].  Discharge Vitals:   BP (!) 154/87 (BP Location: Right Arm)   Pulse 61   Temp 98 F (36.7 C) (Oral)   Resp 18   Ht 6\' 1"  (1.854 m)   Wt 81.6 kg   SpO2 100%   BMI 23.75 kg/m   Pertinent Labs, Studies, and Procedures:  CBC Latest Ref Rng & Units 12/14/2019 12/14/2019 12/26/2018  WBC 4.0 - 10.5 K/uL - 7.1 8.2  Hemoglobin 13.0 - 17.0 g/dL  13.9 14.0 14.3  Hematocrit 39 - 52 % 41.0 41.8 41.4  Platelets 150 - 400 K/uL - 407(H) 423(H)   BMP Latest Ref Rng & Units 12/15/2019 12/14/2019 12/14/2019  Glucose 70 - 99 mg/dL 131(H) 106(H) 109(H)  BUN 8 - 23 mg/dL 16 9 9   Creatinine 0.61 - 1.24 mg/dL 0.69 0.60(L) 0.73  BUN/Creat Ratio 10 - 24 - - -  Sodium 135 - 145 mmol/L 137 146(H) 141  Potassium 3.5 - 5.1 mmol/L 4.1 3.7 4.0  Chloride 98 - 111 mmol/L 107 104 107  CO2 22 - 32 mmol/L 23 - 25  Calcium 8.9 - 10.3 mg/dL 9.4 - 9.1   MR BRAIN W WO CONTRAST (12/14/19) IMPRESSION: 13.5 mm enhancing mass in the right posterior frontal cortex over the convexity. Large amount of surrounding vasogenic edema. This is most likely a solitary metastasis. Primary brain tumor a less likely Consideration.  CT CHEST W/ CONTRAST (12/14/19) IMPRESSION: 1. Mediastinal and left hilar adenopathy of indeterminate etiology. Clinical correlation recommended. Bronchoscopy and tissue sampling may provide better evaluation if clinically indicated. 2. A 4 mm left upper lobe pulmonary nodule. No follow-up needed if patient is low-risk. Non-contrast chest CT can be considered in 12 months if patient is high-risk. This recommendation follows the consensus statement: Guidelines for Management of Incidental Pulmonary Nodules Detected on CT Images: From the Fleischner Society 2017; Radiology 2017; 284:228-243. 3. Low attenuating focus inferior to the left hilum, likely a mildly enlarged lymph node. A pulmonary embolism is less likely. 4. Sigmoid diverticulosis. No bowel obstruction. Normal appendix. 5. Aortic Atherosclerosis (ICD10-I70.0).  CT ABD/PELVIS W CONTRAST (12/14/19) IMPRESSION: 1. Mediastinal and left hilar adenopathy of indeterminate etiology. Clinical correlation recommended. Bronchoscopy and tissue sampling may provide better evaluation if clinically indicated. 2. A 4 mm left upper lobe pulmonary nodule. No follow-up needed if patient is low-risk.  Non-contrast chest CT can be considered in 12 months if patient is high-risk. This recommendation follows the consensus statement: Guidelines for Management of Incidental Pulmonary Nodules Detected on CT Images: From the Fleischner Society 2017; Radiology 2017; 284:228-243. 3. Low attenuating focus inferior to the left hilum, likely a mildly enlarged lymph node. A pulmonary embolism is less likely. 4. Sigmoid diverticulosis. No bowel obstruction. Normal appendix. 5. Aortic Atherosclerosis (ICD10-I70.0).  Discharge Instructions: Discharge Instructions     Activity as tolerated - No restrictions   Complete by: As directed    Diet general   Complete by: As directed    Discharge instructions   Complete by: As directed    Mr. Diekman, I am glad you are feeling well enough to go home. You arrived to the Emergency Department with a headache. We took an image of your head, which found swelling and a mass on the right side of your brain. We also found evidence of cancer around your lungs. It appears the  mass is due to spread of another cancer from another part of your body.  Dr. Christella Shea with neurosurgery is planning on seeing you in clinic this week and scheduling a surgery to remove the mass. We have arranged for you to have an appointment with Dr. Christella Shea on Thursday, August 5th at 9:00 AM. In addition, we would like to see you in the Internal Medicine Clinic here at [facility] in two weeks. We have set that appointment for Wednesday, August 18th at 1:45PM. Please see the following notes regarding your medications:  -Dexamethasone (Decadron): You will take 1 tablet (4mg ) twice per day. This medication will help reduce the swelling in your brain. -Continue taking levetiracetam (Keppra), 1 tablet (750mg ) twice per day. This medication is for your seizures. -Continue taking losartan-hydrochlorothiazide (Hyzaar), 1 tablet (50-12.5mg ) per day. This medication is for your blood pressure.  -You should not  be driving at all. Seizures can come unexpectedly, which can put you, the passengers of the car, and others on the road in danger if you have a seizure while driving. Please make other arrangements for transportation for your safety as well as others.  -Smoking is dangerous and can cause a lot of health problems. There are resources to help you stop smoking. Some of these include medications, gum, and patches. We have prescribed a nicotine gum that can help stop the cravings. Please follow up with your primary care clinic, as we can try other methods if the gum doesn't work.  It was a pleasure meeting you and I wish you all the best!  Thank you, Sanjuan Dame, MD       Signed: Sanjuan Dame, MD 12/15/2019, 10:36 AM   Pager: [phone]

## 2019-12-15 NOTE — Evaluation (Signed)
Physical Therapy Evaluation Patient Details Name: Jared Shea. MRN: [id number] DOB: 03/02/1956 Today's Date: 12/15/2019   History of Present Illness  64yo male c/o increasing HA over the past month; of note, recently started Keppra due to concern over possible seizures. Imaging reveals mass lesion in R posterior frontal cortex suspicious for metastatic growth, also LUL pulmonary nodule/mass. PMH HTN, nicotine dependency  Clinical Impression   Patient received in bed, eager to mobilize and "get out of this hospital!". See below for mobility/assist levels. Overall highly mobile and generally independent with all mobility today without deficits of concern. Did have some mild questionable cognitive moments- had hospital gown on backwards and forgot to wash soap off of hands after using bathroom. Left sitting at EOB with all needs met, son present. Not in need of skilled PT services at this time- PT signing off, thank you for the referral!!     Follow Up Recommendations No PT follow up    Equipment Recommendations  None recommended by PT    Recommendations for Other Services       Precautions / Restrictions Precautions Precautions: None Restrictions Weight Bearing Restrictions: No      Mobility  Bed Mobility Overal bed mobility: Independent                Transfers Overall transfer level: Independent Equipment used: None                Ambulation/Gait Ambulation/Gait assistance: Supervision Gait Distance (Feet): 200 Feet Assistive device: None Gait Pattern/deviations: WFL(Within Functional Limits);Step-through pattern Gait velocity: slightly decreased   General Gait Details: WNL gait pattern, no significalt LOB or unsteadiness noted  Stairs            Wheelchair Mobility    Modified Rankin (Stroke Patients Only)       Balance Overall balance assessment: Mild deficits observed, not formally tested                                            Pertinent Vitals/Pain Pain Assessment: No/denies pain    Home Living Family/patient expects to be discharged to:: Private residence Living Arrangements: Children Available Help at Discharge: Family;Available PRN/intermittently Type of Home: Apartment Home Access: Level entry     Home Layout: One level Home Equipment: None      Prior Function Level of Independence: Independent               Hand Dominance        Extremity/Trunk Assessment   Upper Extremity Assessment Upper Extremity Assessment: Overall WFL for tasks assessed    Lower Extremity Assessment Lower Extremity Assessment: Overall WFL for tasks assessed    Cervical / Trunk Assessment Cervical / Trunk Assessment: Normal  Communication   Communication: No difficulties  Cognition Arousal/Alertness: Awake/alert Behavior During Therapy: WFL for tasks assessed/performed Overall Cognitive Status: Impaired/Different from baseline Area of Impairment: Attention;Memory;Problem solving                   Current Attention Level: Selective Memory: Decreased short-term memory       Problem Solving: Slow processing;Requires verbal cues General Comments: cognition seems mostly functionally intact however did have a couple moments taht were questionable including laying in bed with hosiptal gown on backwards, as well as leaving soap on hands after washing hands in bathroom      General Comments  Exercises     Assessment/Plan    PT Assessment Patent does not need any further PT services  PT Problem List Decreased coordination;Decreased balance;Decreased safety awareness       PT Treatment Interventions Gait training;Balance training    PT Goals (Current goals can be found in the Care Plan section)  Acute Rehab PT Goals Patient Stated Goal: go home asap PT Goal Formulation: With patient/family Time For Goal Achievement: 12/29/19 Potential to Achieve Goals: Good    Frequency  Other (Comment) (PT sign off- eval only)   Barriers to discharge        Co-evaluation               AM-PAC PT "6 Clicks" Mobility  Outcome Measure Help needed turning from your back to your side while in a flat bed without using bedrails?: None Help needed moving from lying on your back to sitting on the side of a flat bed without using bedrails?: None Help needed moving to and from a bed to a chair (including a wheelchair)?: None Help needed standing up from a chair using your arms (e.g., wheelchair or bedside chair)?: None Help needed to walk in hospital room?: None Help needed climbing 3-5 steps with a railing? : A Little 6 Click Score: 23    End of Session   Activity Tolerance: Patient tolerated treatment well Patient left: in bed;with call bell/phone within reach;with family/visitor present Nurse Communication: Mobility status PT Visit Diagnosis: Unsteadiness on feet (R26.81)    Time: 1000-1014 PT Time Calculation (min) (ACUTE ONLY): 14 min   Charges:   PT Evaluation $PT Eval Moderate Complexity: 1 Mod          Windell Norfolk, DPT, PN1   Supplemental Physical Therapist [place]    Pager [phone] Acute Rehab Office [phone]

## 2019-12-15 NOTE — Hospital Course (Addendum)
States that he is wanting to leave the hospital. Discussed the findings on CT, about the mass. He asked about what needs to be done to get ride off mass. Discussed needing brain surgery, then using pathology to determine where mass came from. Stated we will cancel outpatient MRI since he got one last night. Asked if he can eat before he leaves so diet placed. Discussed will be starting high dose steroids. Discussed he should not drive.

## 2019-12-16 ENCOUNTER — Other Ambulatory Visit: Payer: Self-pay | Admitting: Internal Medicine

## 2019-12-16 MED ORDER — NICOTINE 14 MG/24HR TD PT24
14.0000 mg | MEDICATED_PATCH | TRANSDERMAL | 0 refills | Status: AC
Start: 2019-12-16 — End: 2019-12-23

## 2019-12-16 NOTE — Telephone Encounter (Signed)
Please call pt back about nicotine patch.

## 2019-12-16 NOTE — Telephone Encounter (Signed)
Thank you Bonnita Nasuti. I sent in a prescription for the nicotine patch to [facility] pharmacy. Unfortunately, unlikely that insurance will cover it.

## 2019-12-16 NOTE — Telephone Encounter (Signed)
Pt called and is at [facility] and states he needs to pick up his nicotine patch at [facility].  RN informed patient this RX was sent to [facility] per his request, Pt apologizes for the confusion and states to "disregard that and send to [facility]".  RN called [facility] on [address] and   VO given to pharmacist, Astronomer, for Nicotine (Nicoderm CQ 14mg /24hr patch); Place 1 patch onto the skin daily for 7 days. #7 patches. Dr. Derrill Memo pharmacy called and message left to cancel the RX for the nicotine patch. SChaplin, RN,BSN

## 2019-12-16 NOTE — Telephone Encounter (Signed)
Pt need prescription sent to [facility] on [address] [place]

## 2019-12-17 ENCOUNTER — Encounter (HOSPITAL_COMMUNITY): Payer: Self-pay | Admitting: Pulmonary Disease

## 2019-12-17 ENCOUNTER — Telehealth: Payer: Self-pay | Admitting: *Deleted

## 2019-12-17 ENCOUNTER — Encounter (HOSPITAL_COMMUNITY): Payer: Self-pay | Admitting: Student in an Organized Health Care Education/Training Program

## 2019-12-17 ENCOUNTER — Encounter: Payer: Self-pay | Admitting: Internal Medicine

## 2019-12-17 ENCOUNTER — Other Ambulatory Visit: Payer: Self-pay

## 2019-12-17 DIAGNOSIS — R911 Solitary pulmonary nodule: Secondary | ICD-10-CM

## 2019-12-17 NOTE — Telephone Encounter (Signed)
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 Stated he does not a certain form to be filled out. Fax # [phone]. Thanks

## 2019-12-17 NOTE — Telephone Encounter (Signed)
-----   Message from Candee Furbish, MD sent at 12/17/2019  1:51 PM EDT ----- Regarding: FW: Urgent Bronch Hi Lucinda Dell has agreed to do at [place] on Monday if we could get this set up thanks! ----- Message ----- From: Candee Furbish, MD Sent: 12/17/2019  10:34 AM EDT To: Ashok Pall, MD, Collene Gobble, MD, # Subject: Urgent Bronch                                  Hi this gentleman has a brain mass that neurosurgery wants ASAP tissue. I was hoping to have him scheduled for an EBUS bronchoscopy tomorrow, Monday at the latest.  I have informed patient.  Can we schedule him for one of the following: Ideally tomorrow, Friday, would need COVID testing this afternoon and would have consult done in pre-op area.  Patient's preference is ASAP. (1) [facility] endo with Dr. Lake Bells (2) [facility] endo or OR with either Dr. Lamonte Sakai or me if they have room.   Worst case Monday in endo or OR at [facility] with Dr. Carlis Abbott.   Diagnosis: Lung mass (C34.90) Procedure: Bronchoscopy with EBUS (12244) Anesthesia: General Do you need Fluro? No Priority: stat Date: see above Alternate Date: see above  Time: After 9 am Location: See above Does patient have OSA? no DM? no Or Latex allergy? no Medication Restriction: none Anticoagulate/Antiplatelet: none Pre-op Labs Ordered: CBC, CMP, PT/INR, PTT Imaging request: none  (If, SuperDimension CT Chest, please have STAT courier sent to [place].)  Please coordinate Pre-op COVID Testing    Thanks Linna Hoff

## 2019-12-18 ENCOUNTER — Other Ambulatory Visit (HOSPITAL_COMMUNITY)
Admission: RE | Admit: 2019-12-18 | Discharge: 2019-12-18 | Disposition: A | Discharge status: Home / Self Care | Payer: Medicare Other | Source: Ambulatory Visit | Attending: Pulmonary Disease | Admitting: Pulmonary Disease

## 2019-12-18 ENCOUNTER — Encounter: Payer: Self-pay | Admitting: *Deleted

## 2019-12-18 DIAGNOSIS — Z20822 Contact with and (suspected) exposure to covid-19: Secondary | ICD-10-CM | POA: Insufficient documentation

## 2019-12-18 DIAGNOSIS — Z01812 Encounter for preprocedural laboratory examination: Secondary | ICD-10-CM | POA: Insufficient documentation

## 2019-12-18 LAB — SARS CORONAVIRUS 2 (TAT 6-24 HRS): SARS Coronavirus 2: NEGATIVE

## 2019-12-18 NOTE — Telephone Encounter (Signed)
covid test today 12/18/19 and bronch will be Monday 12/21/19@[facility]  [facility] [place]ellen [place]

## 2019-12-18 NOTE — Telephone Encounter (Signed)
Letter per Dr Charleen Kirks faxed to number provided by pt. Pt was called and informed.

## 2019-12-21 ENCOUNTER — Ambulatory Visit (HOSPITAL_COMMUNITY): Payer: Medicare Other

## 2019-12-21 ENCOUNTER — Ambulatory Visit (HOSPITAL_COMMUNITY): Payer: Medicare Other | Admitting: Certified Registered"

## 2019-12-21 ENCOUNTER — Encounter (HOSPITAL_COMMUNITY)
Admission: RE | Disposition: A | Discharge status: Home / Self Care | Payer: Self-pay | Source: Home / Self Care | Attending: Pulmonary Disease

## 2019-12-21 ENCOUNTER — Other Ambulatory Visit: Payer: Self-pay

## 2019-12-21 ENCOUNTER — Encounter (HOSPITAL_COMMUNITY): Payer: Self-pay | Admitting: Pulmonary Disease

## 2019-12-21 ENCOUNTER — Ambulatory Visit (HOSPITAL_COMMUNITY)
Admission: RE | Admit: 2019-12-21 | Discharge: 2019-12-21 | Disposition: A | Discharge status: Home / Self Care | Payer: Medicare Other | Attending: Pulmonary Disease | Admitting: Pulmonary Disease

## 2019-12-21 DIAGNOSIS — Z79899 Other long term (current) drug therapy: Secondary | ICD-10-CM | POA: Diagnosis not present

## 2019-12-21 DIAGNOSIS — I10 Essential (primary) hypertension: Secondary | ICD-10-CM | POA: Insufficient documentation

## 2019-12-21 DIAGNOSIS — C969 Malignant neoplasm of lymphoid, hematopoietic and related tissue, unspecified: Secondary | ICD-10-CM | POA: Diagnosis not present

## 2019-12-21 DIAGNOSIS — Z880 Allergy status to penicillin: Secondary | ICD-10-CM | POA: Insufficient documentation

## 2019-12-21 DIAGNOSIS — F17210 Nicotine dependence, cigarettes, uncomplicated: Secondary | ICD-10-CM | POA: Insufficient documentation

## 2019-12-21 DIAGNOSIS — Z8249 Family history of ischemic heart disease and other diseases of the circulatory system: Secondary | ICD-10-CM | POA: Insufficient documentation

## 2019-12-21 DIAGNOSIS — R590 Localized enlarged lymph nodes: Secondary | ICD-10-CM

## 2019-12-21 DIAGNOSIS — Z9889 Other specified postprocedural states: Secondary | ICD-10-CM

## 2019-12-21 HISTORY — PX: FINE NEEDLE ASPIRATION: SHX5430

## 2019-12-21 HISTORY — DX: Unspecified convulsions: R56.9

## 2019-12-21 HISTORY — DX: Other specified disorders of brain: G93.89

## 2019-12-21 HISTORY — PX: VIDEO BRONCHOSCOPY: SHX5072

## 2019-12-21 HISTORY — PX: ENDOBRONCHIAL ULTRASOUND: SHX5096

## 2019-12-21 SURGERY — VIDEO BRONCHOSCOPY WITHOUT FLUORO
Anesthesia: General

## 2019-12-21 MED ORDER — FENTANYL CITRATE (PF) 100 MCG/2ML IJ SOLN
INTRAMUSCULAR | Status: AC
  Filled 2019-12-21: qty 2

## 2019-12-21 MED ORDER — DEXAMETHASONE SODIUM PHOSPHATE 10 MG/ML IJ SOLN
INTRAMUSCULAR | Status: DC | PRN
  Administered 2019-12-21: 10 mg via INTRAVENOUS

## 2019-12-21 MED ORDER — FENTANYL CITRATE (PF) 100 MCG/2ML IJ SOLN
INTRAMUSCULAR | Status: DC | PRN
  Administered 2019-12-21 (×2): 50 ug via INTRAVENOUS

## 2019-12-21 MED ORDER — MIDAZOLAM HCL 5 MG/5ML IJ SOLN
INTRAMUSCULAR | Status: DC | PRN
  Administered 2019-12-21: 2 mg via INTRAVENOUS

## 2019-12-21 MED ORDER — ROCURONIUM BROMIDE 10 MG/ML (PF) SYRINGE
PREFILLED_SYRINGE | INTRAVENOUS | Status: DC | PRN
  Administered 2019-12-21: 80 mg via INTRAVENOUS

## 2019-12-21 MED ORDER — ONDANSETRON HCL 4 MG/2ML IJ SOLN
INTRAMUSCULAR | Status: DC | PRN
  Administered 2019-12-21: 4 mg via INTRAVENOUS

## 2019-12-21 MED ORDER — MIDAZOLAM HCL 2 MG/2ML IJ SOLN
INTRAMUSCULAR | Status: AC
  Filled 2019-12-21: qty 2

## 2019-12-21 MED ORDER — LACTATED RINGERS IV SOLN
INTRAVENOUS | Status: DC | PRN
Start: 2019-12-21 — End: 2019-12-21

## 2019-12-21 MED ORDER — PROPOFOL 10 MG/ML IV BOLUS
INTRAVENOUS | Status: AC
  Filled 2019-12-21: qty 20

## 2019-12-21 MED ORDER — SUGAMMADEX SODIUM 500 MG/5ML IV SOLN
INTRAVENOUS | Status: DC | PRN
  Administered 2019-12-21: 350 mg via INTRAVENOUS

## 2019-12-21 MED ORDER — LIDOCAINE 2% (20 MG/ML) 5 ML SYRINGE
INTRAMUSCULAR | Status: DC | PRN
  Administered 2019-12-21: 80 mg via INTRAVENOUS

## 2019-12-21 MED ORDER — PROPOFOL 10 MG/ML IV BOLUS
INTRAVENOUS | Status: DC | PRN
  Administered 2019-12-21: 160 mg via INTRAVENOUS

## 2019-12-21 NOTE — H&P (Signed)
NAME:  Jared Shea., MRN:  [id number], DOB:  08-22-1955, LOS: 0 ADMISSION DATE:  12/21/2019, H&P DATE: 12/21/2019 REFERRING MD:  , CHIEF COMPLAINT: Mediastinal adenopathy  Brief History   Patient was recently evaluated in the emergency department with headache Found to have Adenopathy on CT scan of the chest -Reason for bronchoscopy He had initially been seen by his primary care doctor for facial tingling and headache There was a concern for seizures, he was started on Keppra CT and MRI in the ED showed a mass in the right frontal lobe Active smoker Only other significant history is hypertension  24-pack-year smoking history, down to 2 cigarettes a day at present  Past Medical History   Past Medical History:  Diagnosis Date  . Brain mass   . Bursitis of right hip   . Chest pain 08/12/2018  . Chronic cough   . Dependence on nicotine from cigarettes   . Essential hypertension 07/28/2018  . Seizures ([place])   . Viral illness 03/23/2019     Significant Hospital Events   None  Consults:  Recently seen by neurosurgery  Procedures:  None  Significant Diagnostic Tests:  CT chest significant for mediastinal adenopathy, 4 mm left lung nodule -CT chest was reviewed by myself Micro Data:  None  Antimicrobials:  None  Interim history/subjective:  Feels generally well today with no significant complaints  Objective   Blood pressure (!) 154/75, pulse 78, temperature 98 F (36.7 C), temperature source Oral, resp. rate 18, height 6\' 1"  (1.854 m), weight 81.2 kg, SpO2 98 %.       No intake or output data in the 24 hours ending 12/21/19 0737 Filed Weights   12/17/19 1609 12/21/19 0721  Weight: 78.9 kg 81.2 kg    Examination: General: Middle-age gentleman, does not appear to be in distress  HENT: Moist oral mucosa Lungs: Clear breath sounds bilaterally Cardiovascular: S1-S2 appreciated Abdomen: Soft, bowel sounds appreciated Extremities: No clubbing, no edema Neuro:  Alert and oriented x3 GU:   Resolved Hospital Problem list     Assessment & Plan:  Mediastinal adenopathy Right frontal lobe mass Active smoker Hypertension-controlled  Plan today is for EBUS bronchoscopy   Labs   CBC: Recent Labs  Lab 12/14/19 1419 12/14/19 1457  WBC 7.1  --   NEUTROABS 3.8  --   HGB 14.0 13.9  HCT 41.8 41.0  MCV 89.7  --   PLT 407*  --     Basic Metabolic Panel: Recent Labs  Lab 12/14/19 1419 12/14/19 1457 12/15/19 0617  NA 141 146* 137  K 4.0 3.7 4.1  CL 107 104 107  CO2 25  --  23  GLUCOSE 109* 106* 131*  BUN 9 9 16   CREATININE 0.73 0.60* 0.69  CALCIUM 9.1  --  9.4  MG  --   --  2.1   GFR: Estimated Creatinine Clearance: 104 mL/min (by C-G formula based on SCr of 0.69 mg/dL). Recent Labs  Lab 12/14/19 1419  WBC 7.1    Liver Function Tests: Recent Labs  Lab 12/14/19 1419  AST 34  ALT 32  ALKPHOS 68  BILITOT 0.7  PROT 7.4  ALBUMIN 4.1   No results for input(s): LIPASE, AMYLASE in the last 168 hours. No results for input(s): AMMONIA in the last 168 hours.  ABG    Component Value Date/Time   TCO2 25 12/14/2019 1457     Coagulation Profile: Recent Labs  Lab 12/14/19 1419  INR 1.1  Cardiac Enzymes: No results for input(s): CKTOTAL, CKMB, CKMBINDEX, TROPONINI in the last 168 hours.  HbA1C: No results found for: HGBA1C  CBG: Recent Labs  Lab 12/14/19 1711  GLUCAP 96    Review of Systems:   Occasional cough No chest pains No chest discomfort  Past Medical History  He,  has a past medical history of Brain mass, Bursitis of right hip, Chest pain (08/12/2018), Chronic cough, Dependence on nicotine from cigarettes, Essential hypertension (07/28/2018), Seizures ([place]), and Viral illness (03/23/2019).   Surgical History    Past Surgical History:  Procedure Laterality Date  . NO PAST SURGERIES       Social History   reports that he has been smoking cigarettes. He started smoking about 49 years ago. He has  a 24.00 pack-year smoking history. He has never used smokeless tobacco. He reports current alcohol use. He reports that he does not use drugs.   Family History   His family history includes Diabetes in his brother; Healthy in his daughter, daughter, and son; Heart disease in his mother; Hypertension in his sister; Kidney disease in his brother and sister.   Allergies Allergies  Allergen Reactions  . Penicillins Rash     Home Medications  Prior to Admission medications   Medication Sig Start Date End Date Taking? Authorizing Provider  dexamethasone (DECADRON) 4 MG tablet Take 1 tablet (4 mg total) by mouth 2 (two) times daily for 14 days. 12/15/19 12/29/19 Yes Sanjuan Dame, MD  ibuprofen (ADVIL) 400 MG tablet Take 400 mg by mouth every 6 (six) hours as needed for headache.   Yes [provider]  levETIRAcetam (KEPPRA) 750 MG tablet Take 1 tablet (750 mg total) by mouth 2 (two) times daily. 12/09/19  Yes Mosetta Anis, MD  losartan-hydrochlorothiazide (HYZAAR) 50-12.5 MG tablet Take 1 tablet by mouth daily. 10/13/19  Yes Agyei, Caprice Kluver, MD  nicotine (NICODERM CQ - DOSED IN MG/24 HOURS) 14 mg/24hr patch Place 1 patch (14 mg total) onto the skin daily for 7 days. 12/16/19 12/23/19 Yes Jose Persia, MD  nicotine polacrilex (NICORETTE) 4 MG gum Take 1 each (4 mg total) by mouth as needed for smoking cessation. Patient not taking: Reported on 12/14/2019 12/09/19   Mosetta Anis, MD    Sherrilyn Rist, MD [facility] Pager: [phone]

## 2019-12-21 NOTE — Anesthesia Procedure Notes (Signed)
Procedure Name: Intubation Date/Time: 12/21/2019 7:48 AM Performed by: Carmeline Kowal D, CRNA Pre-anesthesia Checklist: Patient identified, Emergency Drugs available, Suction available and Patient being monitored Patient Re-evaluated:Patient Re-evaluated prior to induction Oxygen Delivery Method: Circle system utilized Preoxygenation: Pre-oxygenation with 100% oxygen Induction Type: IV induction Ventilation: Mask ventilation without difficulty Laryngoscope Size: Mac and 4 Grade View: Grade II Tube type: Oral Tube size: 8.5 mm Number of attempts: 1 Airway Equipment and Method: Stylet Placement Confirmation: ETT inserted through vocal cords under direct vision,  positive ETCO2 and breath sounds checked- equal and bilateral Secured at: 23 cm Tube secured with: Tape Dental Injury: Teeth and Oropharynx as per pre-operative assessment

## 2019-12-21 NOTE — Transfer of Care (Signed)
Immediate Anesthesia Transfer of Care Note  Patient: Jared Shea.  Procedure(s) Performed: VIDEO BRONCHOSCOPY WITHOUT FLUORO (N/A ) ENDOBRONCHIAL ULTRASOUND (N/A ) FINE NEEDLE ASPIRATION (FNA) LINEAR  Patient Location: PACU  Anesthesia Type:General  Level of Consciousness: awake, alert  and oriented  Airway & Oxygen Therapy: Patient Spontanous Breathing and Patient connected to face mask oxygen  Post-op Assessment: Report given to RN and Post -op Vital signs reviewed and stable  Post vital signs: Reviewed and stable  Last Vitals:  Vitals Value Taken Time  BP    Temp    Pulse 67 12/21/19 0903  Resp 16 12/21/19 0903  SpO2 100 % 12/21/19 0903  Vitals shown include unvalidated device data.  Last Pain:  Vitals:   12/21/19 0721  TempSrc: Oral  PainSc: 0-No pain         Complications: No complications documented.

## 2019-12-21 NOTE — Discharge Instructions (Signed)
Flexible Bronchoscopy, Care After This sheet gives you information about how to care for yourself after your test. Your doctor may also give you more specific instructions. If you have problems or questions, contact your doctor. Follow these instructions at home: Eating and drinking  Do not eat or drink anything (not even water) for 2 hours after your test, or until your numbing medicine (local anesthetic) wears off.  When your numbness is gone and your cough and gag reflexes have come back, you may: ? Eat only soft foods. ? Slowly drink liquids.  The day after the test, go back to your normal diet. Driving  Do not drive for 24 hours if you were given a medicine to help you relax (sedative).  Do not drive or use heavy machinery while taking prescription pain medicine. General instructions   Take over-the-counter and prescription medicines only as told by your doctor.  Return to your normal activities as told. Ask what activities are safe for you.  Do not use any products that have nicotine or tobacco in them. This includes cigarettes and e-cigarettes. If you need help quitting, ask your doctor.  Keep all follow-up visits as told by your doctor. This is important. It is very important if you had a tissue sample (biopsy) taken. Get help right away if:  You have shortness of breath that gets worse.  You get light-headed.  You feel like you are going to pass out (faint).  You have chest pain.  You cough up: ? More than a little blood. ? More blood than before. Summary  Do not eat or drink anything (not even water) for 2 hours after your test, or until your numbing medicine wears off.  Do not use cigarettes. Do not use e-cigarettes.  Get help right away if you have chest pain. This information is not intended to replace advice given to you by your health care provider. Make sure you discuss any questions you have with your health care provider. Document Revised: 04/12/2017  Document Reviewed: 05/18/2016 Elsevier Patient Education  2020 [employer].

## 2019-12-21 NOTE — Op Note (Signed)
Name:  [name]. MRN:  [id number] DOB:  June 12, 1955  PROCEDURE NOTE  Procedure(s): Flexible bronchoscopy (75916) Endobronchial ultrasound (38466) Transbronchial needle aspiration (59935) of the subcarinal LN  Indications:  Hilar / mediastinal lymphadenopathy.  Consent:  Procedure, benefits, risks and alternatives discussed.  Questions answered.  Consent obtained.  Anesthesia:  General endotracheal.  Procedure summary:  Appropriate equipment was assembled.  The patient was brought to the operating room and identified as Jared Shea..  Safety timeout was performed. The patient was placed supine on the operating table, airway established and general anesthesia administered by Anesthesia team.   After the appropriate level of anesthesia was assured, flexible video bronchoscope was lubricated and inserted through the endotracheal tube.   Airway examination was performed bilaterally to subsegmental level.  Minimal clear secretions were noted, mucosa appeared normal and no endobronchial lesions were identified.  Endobronchial ultrasound video bronchoscope was then lubricated and inserted through the endotracheal tube. Surveillance of the mediastinal and and bilateral hilar lymph node stations was performed.  Pathologically enlarged lymph nodes were noted.  Endobronchial ultrasound guided transbronchial needle aspiration of subcarina (passes 8)was performed, after which EBUS bronchoscope was withdrawn.  The patient was extubated in operating room and transferred to PACU. Post-procedure chest x-ray was ordered.  Specimens sent: LN aspirate  Complications:  No immediate complications were noted.  Hemodynamic parameters and oxygenation remained stable throughout the procedure.  Estimated blood loss:  Less then 5 mL.  Clearence Ped, M.D. Pulmonary and [place] 12/21/2019, 8:48 AM

## 2019-12-21 NOTE — Anesthesia Preprocedure Evaluation (Signed)
Anesthesia Evaluation  Patient identified by MRN, date of birth, ID band Patient awake    Reviewed: Allergy & Precautions, NPO status , Patient's Chart, lab work & pertinent test results  Airway Mallampati: II  TM Distance: >3 FB Neck ROM: Full    Dental no notable dental hx.    Pulmonary neg pulmonary ROS, Current Smoker,    Pulmonary exam normal breath sounds clear to auscultation       Cardiovascular hypertension, Pt. on medications negative cardio ROS Normal cardiovascular exam Rhythm:Regular Rate:Normal     Neuro/Psych Seizures -,  negative psych ROS   GI/Hepatic negative GI ROS, Neg liver ROS,   Endo/Other  negative endocrine ROS  Renal/GU negative Renal ROS  negative genitourinary   Musculoskeletal negative musculoskeletal ROS (+)   Abdominal   Peds negative pediatric ROS (+)  Hematology negative hematology ROS (+)   Anesthesia Other Findings Lung mass  Reproductive/Obstetrics negative OB ROS                             Anesthesia Physical Anesthesia Plan  ASA: III  Anesthesia Plan: General   Post-op Pain Management:    Induction: Intravenous  PONV Risk Score and Plan: 1 and Ondansetron  Airway Management Planned: Oral ETT  Additional Equipment:   Intra-op Plan:   Post-operative Plan: Extubation in OR  Informed Consent: I have reviewed the patients History and Physical, chart, labs and discussed the procedure including the risks, benefits and alternatives for the proposed anesthesia with the patient or authorized representative who has indicated his/her understanding and acceptance.     Dental advisory given  Plan Discussed with: CRNA  Anesthesia Plan Comments:         Anesthesia Quick Evaluation

## 2019-12-21 NOTE — Op Note (Signed)
[facility] Cardiopulmonary Patient Name: Jared Shea Procedure Date: 12/21/2019 MRN: [id number] Attending MD: Tomma Lightning MD, MD Date of Birth: 12-26-1955 CSN: [id number] Age: 64 Admit Type: Outpatient Ethnicity: Not Hispanic or Latino Procedure:             Bronchoscopy Indications:           Mediastinal adenopathy Providers:             Parthenia Tellefsen A. Wynona Neat MD, MD, Estella Husk, Faustina                         Mbumina, Technician, Marc Morgans, Technician Referring MD:           Medicines:             See the Anesthesia note for documentation of the                         administered medications Complications:         No immediate complications Estimated Blood Loss:  Estimated blood loss: none. Procedure:      Pre-Anesthesia Assessment:      - The heart rate, respiratory rate, oxygen saturations, blood pressure,       adequacy of pulmonary ventilation, and response to care were monitored       throughout the procedure.      - A History and Physical has been performed. The patient's medications,       allergies and sensitivities have been reviewed.      After obtaining informed consent, the bronchoscope was passed under       direct vision. Throughout the procedure, the patient's blood pressure,       pulse, and oxygen saturations were monitored continuously. the [id number]       ([id number]) Olympus therapeutic bronchoscope was introduced through the       mouth, via the endotracheal tube (the patient was intubated for the       procedure) and advanced to the tracheobronchial tree of both lungs. the       [id number] ([id number]) Olympus EBUS scope was introduced through the mouth,       via the endotracheal tube (the patient was intubated for the procedure)       and advanced to the tracheobronchial tree of both lungs. The procedure       was accomplished without difficulty. The patient tolerated the procedure       well. Findings:      The scope was withdrawn  and replaced with the EBUS bronchoscope to       accomplish the ultrasound examination.      Lymph Nodes: An endobronchial ultrasound endoscope was utilized to       systematically examine the subcarinal mediastinum (level 7) in order to       assist with fine needle aspiration. Lymph node sizing was performed via       endobronchial ultrasound for suspected lung cancer. Sampling by       transbronchial needle aspiration was also performed using an Olympus       ViziShot 2 FLEX 19 gauge needle in the subcarinal mediastinum (level 7)       and sent for routine cytology and histopathology examination. . Node was       evaluated.      No other significant adenopathy identified Impression:      - Mediastinal adenopathy      -  Endobronchial ultrasound was performed.      - Lymph node sizing and sampling was performed.      - The airway examination was normal. Moderate Sedation:      An independent trained observer was present and continuously monitored       the patient. Recommendation:      - Await biopsy and cytology results. Procedure Code(s):      --- Professional ---      [id number], Bronchoscopy, rigid or flexible, including fluoroscopic guidance,       when performed; with endobronchial ultrasound (EBUS) guided       transtracheal and/or transbronchial sampling (eg,       aspiration[s]/biopsy[ies]), one or two mediastinal and/or hilar lymph       node stations or structures Diagnosis Code(s):      --- Professional ---      R59.0, Localized enlarged lymph nodes CPT copyright 2019 American Medical Association. All rights reserved. The codes documented in this report are preliminary and upon coder review may  be revised to meet current compliance requirements. Virl Diamond, MD Tomma Lightning MD, MD 12/21/2019 9:06:31 AM This report has been signed electronically. Number of Addenda: 0 Scope In: Scope Out:

## 2019-12-21 NOTE — Anesthesia Postprocedure Evaluation (Signed)
Anesthesia Post Note  Patient: Jared Shea.  Procedure(s) Performed: VIDEO BRONCHOSCOPY WITHOUT FLUORO (N/A ) ENDOBRONCHIAL ULTRASOUND (N/A ) FINE NEEDLE ASPIRATION (FNA) LINEAR     Patient location during evaluation: Endoscopy Anesthesia Type: General Level of consciousness: awake and alert Pain management: pain level controlled Vital Signs Assessment: post-procedure vital signs reviewed and stable Respiratory status: spontaneous breathing, nonlabored ventilation and respiratory function stable Cardiovascular status: blood pressure returned to baseline and stable Postop Assessment: no apparent nausea or vomiting Anesthetic complications: no   No complications documented.  Last Vitals:  Vitals:   12/21/19 0721 12/21/19 0908  BP: (!) 154/75 (!) 166/49  Pulse: 78 62  Resp: 18 (!) 9  Temp: 36.7 C (!) 35.7 C  SpO2: 98% 99%    Last Pain:  Vitals:   12/21/19 0908  TempSrc: Axillary  PainSc: 0-No pain                 Lynda Rainwater

## 2019-12-22 ENCOUNTER — Encounter (HOSPITAL_COMMUNITY): Payer: Self-pay | Admitting: Pulmonary Disease

## 2019-12-22 ENCOUNTER — Telehealth: Payer: Self-pay | Admitting: Pulmonary Disease

## 2019-12-22 ENCOUNTER — Other Ambulatory Visit: Payer: Self-pay | Admitting: Radiation Therapy

## 2019-12-22 DIAGNOSIS — C3490 Malignant neoplasm of unspecified part of unspecified bronchus or lung: Secondary | ICD-10-CM

## 2019-12-22 DIAGNOSIS — C7949 Secondary malignant neoplasm of other parts of nervous system: Secondary | ICD-10-CM

## 2019-12-22 DIAGNOSIS — C7931 Secondary malignant neoplasm of brain: Secondary | ICD-10-CM

## 2019-12-22 LAB — CYTOLOGY - NON PAP

## 2019-12-22 NOTE — Telephone Encounter (Signed)
Patient is reporting hiccups post bronch. He can not get them to stop. Please advise.

## 2019-12-22 NOTE — Addendum Note (Signed)
Addended by: Pincus Large on: 12/22/2019 10:57 AM   Modules accepted: Orders

## 2019-12-23 ENCOUNTER — Ambulatory Visit (INDEPENDENT_AMBULATORY_CARE_PROVIDER_SITE_OTHER): Payer: Medicare Other | Admitting: Neurology

## 2019-12-23 ENCOUNTER — Telehealth: Payer: Self-pay | Admitting: *Deleted

## 2019-12-23 ENCOUNTER — Other Ambulatory Visit: Payer: Self-pay

## 2019-12-23 ENCOUNTER — Encounter: Payer: Self-pay | Admitting: Neurology

## 2019-12-23 VITALS — BP 126/78 | HR 63 | Ht 73.0 in | Wt 180.3 lb

## 2019-12-23 DIAGNOSIS — R569 Unspecified convulsions: Secondary | ICD-10-CM

## 2019-12-23 DIAGNOSIS — R202 Paresthesia of skin: Secondary | ICD-10-CM

## 2019-12-23 DIAGNOSIS — C7931 Secondary malignant neoplasm of brain: Secondary | ICD-10-CM | POA: Insufficient documentation

## 2019-12-23 DIAGNOSIS — G40209 Localization-related (focal) (partial) symptomatic epilepsy and epileptic syndromes with complex partial seizures, not intractable, without status epilepticus: Secondary | ICD-10-CM | POA: Insufficient documentation

## 2019-12-23 MED ORDER — LEVETIRACETAM 750 MG PO TABS: 750.0000 mg | ORAL_TABLET | Freq: Two times a day (BID) | ORAL | 4 refills | Status: DC

## 2019-12-23 NOTE — Progress Notes (Signed)
HISTORICAL  Jared Shea. is a 64 year old male, seen in request by his primary care physician Dr.Agyei, Jared Shea, for evaluation of complex partial seizure, metastatic brain lesion, initial evaluation was on December 23, 2019.  I reviewed and summarized the referring note. He is a longtime smoker, works as [name]  In July 2021, at work, while hooking up a wall socket, he suddenly felt hot, then started to have next month twitching lasting for 5 minutes, followed by confusion afterwards, then went away  Second episode was 2 weeks later, he woke up from sleep with left muscle twitching, difficulty breathing, lasting for 5 minutes, again followed by postevent confusion, lasting few more minutes  She was diagnosed with complex partial seizure, personally reviewed CT, and MRI of the brain with and without contrast December 14, 2019, right posterior frontal cortex 13.5 mm enhancing lesion with surrounding vasogenic edema, most likely a solitary metastatic.  He has radiation oncology appointment pending with Dr. Lisbeth Shea on December 24, 2019  CT of the chest/pelvis with without contrast December 14, 2019, mediastinum and left hilar adenopathy, 4 mm left upper lobe nodule, low attenuating focus inferior to left hilum likely enlarged lymph node  He had transbronchial needle aspiration with flexible bronchoscopy under endobronchial ultrasound guidance on December 21 2019, pathology confirmed malignant cells consistent with metastatic non-small cell carcinoma  Laboratory evaluation August 2021, negative HIV, BMP, CBC  He complains of frequent headaches, dizziness, fatigue taking Keppra 750 twice a day, there was no recurrent seizure  REVIEW OF SYSTEMS: Full 14 system review of systems performed and notable only for as above All other review of systems were negative.  ALLERGIES: Allergies  Allergen Reactions  . Penicillins Rash    HOME MEDICATIONS: Current Outpatient Medications  Medication Sig  Dispense Refill  . dexamethasone (DECADRON) 4 MG tablet Take 1 tablet (4 mg total) by mouth 2 (two) times daily for 14 days. 28 tablet 0  . ibuprofen (ADVIL) 400 MG tablet Take 400 mg by mouth every 6 (six) hours as needed for headache.    . levETIRAcetam (KEPPRA) 750 MG tablet Take 1 tablet (750 mg total) by mouth 2 (two) times daily. 60 tablet 3  . losartan-hydrochlorothiazide (HYZAAR) 50-12.5 MG tablet Take 1 tablet by mouth daily. 60 tablet 5  . nicotine (NICODERM CQ - DOSED IN MG/24 HOURS) 14 mg/24hr patch Place 1 patch (14 mg total) onto the skin daily for 7 days. 7 patch 0  . nicotine polacrilex (NICORETTE) 4 MG gum Take 1 each (4 mg total) by mouth as needed for smoking cessation. 40 tablet 2   No current facility-administered medications for this visit.    PAST MEDICAL HISTORY: Past Medical History:  Diagnosis Date  . Brain mass   . Bursitis of right hip   . Chest pain 08/12/2018  . Chronic cough   . Dependence on nicotine from cigarettes   . Essential hypertension 07/28/2018  . Seizures ([name])   . Viral illness 03/23/2019    PAST SURGICAL HISTORY: Past Surgical History:  Procedure Laterality Date  . ENDOBRONCHIAL ULTRASOUND N/A 12/21/2019   Procedure: ENDOBRONCHIAL ULTRASOUND;  Surgeon: Laurin Coder, MD;  Location: [facility];  Service: Pulmonary;  Laterality: N/A;  . FINE NEEDLE ASPIRATION  12/21/2019   Procedure: FINE NEEDLE ASPIRATION (FNA) LINEAR;  Surgeon: Laurin Coder, MD;  Location: [facility];  Service: Pulmonary;;  . NO PAST SURGERIES    . VIDEO BRONCHOSCOPY N/A 12/21/2019   Procedure: VIDEO BRONCHOSCOPY WITHOUT FLUORO;  Surgeon: Laurin Coder, MD;  Location: [facility];  Service: Pulmonary;  Laterality: N/A;    FAMILY HISTORY: Family History  Problem Relation Age of Onset  . Heart disease Mother        CABG  . Kidney disease Sister   . Diabetes Brother   . Kidney disease Brother        KIDNEY TRANSPLANT  . Hypertension Sister   . Healthy  Daughter   . Healthy Daughter   . Healthy Son     SOCIAL HISTORY: Social History   Socioeconomic History  . Marital status: Divorced    Spouse name: Not on file  . Number of children: Not on file  . Years of education: Not on file  . Highest education level: Not on file  Occupational History  . Not on file  Tobacco Use  . Smoking status: Current Every Day Smoker    Packs/day: 0.50    Years: 48.00    Pack years: 24.00    Types: Cigarettes    Start date: 08/07/1970  . Smokeless tobacco: Never Used  Vaping Use  . Vaping Use: Never used  Substance and Sexual Activity  . Alcohol use: Yes  . Drug use: Never  . Sexual activity: Not on file    Comment: YES  Other Topics Concern  . Not on file  Social History Narrative  . Not on file   Social Determinants of Health   Financial Resource Strain:   . Difficulty of Paying Living Expenses:   Food Insecurity:   . Worried About Charity fundraiser in the Last Year:   . Arboriculturist in the Last Year:   Transportation Needs:   . Film/video editor (Medical):   Marland Kitchen Lack of Transportation (Non-Medical):   Physical Activity:   . Days of Exercise per Week:   . Minutes of Exercise per Session:   Stress:   . Feeling of Stress :   Social Connections:   . Frequency of Communication with Friends and Family:   . Frequency of Social Gatherings with Friends and Family:   . Attends Religious Services:   . Active Member of Clubs or Organizations:   . Attends Archivist Meetings:   Marland Kitchen Marital Status:   Intimate Partner Violence:   . Fear of Current or Ex-Partner:   . Emotionally Abused:   Marland Kitchen Physically Abused:   . Sexually Abused:      PHYSICAL EXAM   Vitals:   12/23/19 1042  BP: 126/78  Pulse: 63  SpO2: 97%  Weight: 180 lb 5 oz (81.8 kg)  Height: _0  (1.854 m)   Not recorded     Body mass index is 23.79 kg/m.  PHYSICAL EXAMNIATION:  Gen: NAD, conversant, well nourised, well groomed                      Cardiovascular: Regular rate rhythm, no peripheral edema, warm, nontender. Eyes: Conjunctivae clear without exudates or hemorrhage Neck: Supple, no carotid bruits. Pulmonary: Clear to auscultation bilaterally   NEUROLOGICAL EXAM:  MENTAL STATUS: Speech:    Speech is normal; fluent and spontaneous with normal comprehension.  Cognition:     Orientation to time, place and person     Normal recent and remote memory     Normal Attention span and concentration     Normal Language, naming, repeating,spontaneous speech     Fund of knowledge   CRANIAL NERVES: CN II: Visual fields  are full to confrontation. Pupils are round equal and briskly reactive to light. CN III, IV, VI: extraocular movement are normal. No ptosis. CN V: Facial sensation is intact to light touch CN VII: Face is symmetric with normal eye closure  CN VIII: Hearing is normal to causal conversation. CN IX, X: Phonation is normal. CN XI: Head turning and shoulder shrug are intact  MOTOR: Mild fixation of left arm upon rapid rotating movement  REFLEXES: Reflexes are 2+ and symmetric at the biceps, triceps, knees, and ankles. Plantar responses are flexor.  SENSORY: Intact to light touch, pinprick and vibratory sensation are intact in fingers and toes.  COORDINATION: There is no trunk or limb dysmetria noted.  GAIT/STANCE: Posture is normal. Gait is steady with normal steps, base, arm swing, and turning. Heel and toe walking are normal. Tandem gait is normal.  Romberg is absent.   DIAGNOSTIC DATA (LABS, IMAGING, TESTING) - I reviewed patient records, labs, notes, testing and imaging myself where available.   ASSESSMENT AND PLAN  Jared Shea. is a 64 y.o. male   Complex partial seizure due to metastatic right posterior frontal lesion  Keep Keppra 750 mg twice a day  EEG  Call clinic for recurrent spells, may consider higher dose of Keppra if needed  No driving until episode free for 6 months  Brain  metastatic lesion  Pathology confirmed non-small cell carcinoma  Radiation oncology appointment pending   Marcial Pacas, M.D. Ph.D.  [facility] [address], [place], [place] [address] Ph: [phone] Fax: [phone]  CC:  Jean Rosenthal, MD [address] [place],  [place] [address]

## 2019-12-23 NOTE — Progress Notes (Signed)
Location/Histology of Brain Tumor:  Right posterior frontal  Patient presented to his PCP with complaints of facial tingling, and headache.  No abnormalities noted on exam 11/23/2019.  He was seen by his PCP 12/09/2019 with complaints of facial twitching on the left side with associated headache.  12/14/2019 he called the clinic and reported severe headache and visual changes, advised to go to ED.  CT CAP 12/14/2019: Mediastinal and left hilar adenopathy of indeterminate etiology. Clinical correlation recommended. Bronchoscopy and tissue sampling may provide better evaluation if clinically indicated.  A 4 mm left upper lobe pulmonary nodule. No follow-up needed if patient is low-risk. Non-contrast chest CT can be considered in 12 months if patient is high-risk.  Low attenuating focus inferior to the left hilum, likely a mildly enlarged lymph node. A pulmonary embolism is less likely.    MRI Head 12/14/2019: 13.5 mm enhancing mass in the right posterior frontal cortex over the convexity.  Large amount of surrounding vasogenic edema.  This is most likely a solitary metastasis.  Primary brain tumor a less likely consideration.  CT Head 12/14/2019: Prominent region of vasogenic edema in the right posterior frontal region and frontoparietal junction region worrisome for the presence of an occult underlying mass.  Cerebritis is less likely.   Past or anticipated interventions, if any, per neurosurgery:  Dr. Christella Noa 12/14/2019 -probable metastatic lesion in right frontal lobe.  -Will need resection. -Start decadron 4 mg BID. -I will see him in the office this week to further discuss operative plans and oncologic workup.   Past or anticipated interventions, if any, per medical oncology:   Dose of Decadron, if applicable: 4 mg BID  Recent neurologic symptoms, if any:   Seizures: no  Headaches: None since starting decadron  Nausea: Occasional  Dizziness/ataxia: Has some gait imbalance  Difficulty with  hand coordination: No  Focal numbness/weakness: No  Visual deficits/changes: No  Confusion/Memory deficits: No   Signs/Symptoms  Weight changes, if any: No  Respiratory complaints, if any: No  Hemoptysis, if any: No  Pain issues, if any:  No   SAFETY ISSUES:  Prior radiation? No  Pacemaker/ICD? No  Possible current pregnancy? n/a  Is the patient on methotrexate? No  Additional Complaints / other details:

## 2019-12-23 NOTE — Telephone Encounter (Signed)
Pt's daughter, Adline Mango calling back regarding hiccups.  Please advise.  Was waiting for someone to call her back yesterday.  Also asking about a PET scan to be ordered.  [phone]

## 2019-12-23 NOTE — Telephone Encounter (Signed)
Pt's daughter not on DPR  I called and spoke with the pt  I advised will send another msg to Dr Ander Slade and ask for recs regarding his hiccups  I advised this may or may not be coming from having the bronch  He also asks if he needs to have PET scan done  Please advise, thanks

## 2019-12-23 NOTE — Telephone Encounter (Signed)
I agree with patient to contact the pharmacy.

## 2019-12-23 NOTE — Telephone Encounter (Signed)
Patient called in requesting refill on decadron. Explained this was a 2 week Rx. States he picked up med on 12/15/2019 and has been taking 1 tab BID but only has 3-4 tabs left. Confirmed with Lattie Haw at [facility] that patient was given 28 tabs so should have 12 tabs left. Patient's bottle states there were 28 tabs but he insists there were not. Encouraged patient to call [facility] pharmacy to discuss. Patient is concerned because he states this is the only thing that helps his headaches. Will route to yellow team to advise. Hubbard Hartshorn, BSN, RN-BC

## 2019-12-24 ENCOUNTER — Encounter: Payer: Self-pay | Admitting: *Deleted

## 2019-12-24 ENCOUNTER — Other Ambulatory Visit (HOSPITAL_COMMUNITY): Payer: Medicare Other

## 2019-12-24 ENCOUNTER — Telehealth: Payer: Self-pay | Admitting: Pulmonary Disease

## 2019-12-24 ENCOUNTER — Other Ambulatory Visit: Payer: Self-pay

## 2019-12-24 ENCOUNTER — Ambulatory Visit
Admission: RE | Admit: 2019-12-24 | Discharge: 2019-12-24 | Disposition: A | Discharge status: Home / Self Care | Payer: Medicare Other | Source: Ambulatory Visit | Attending: Radiation Oncology | Admitting: Radiation Oncology

## 2019-12-24 ENCOUNTER — Encounter: Payer: Self-pay | Admitting: Radiation Oncology

## 2019-12-24 ENCOUNTER — Encounter: Payer: Self-pay | Admitting: General Practice

## 2019-12-24 VITALS — Ht 73.0 in | Wt 180.0 lb

## 2019-12-24 DIAGNOSIS — C7931 Secondary malignant neoplasm of brain: Secondary | ICD-10-CM

## 2019-12-24 DIAGNOSIS — R911 Solitary pulmonary nodule: Secondary | ICD-10-CM

## 2019-12-24 DIAGNOSIS — G40209 Localization-related (focal) (partial) symptomatic epilepsy and epileptic syndromes with complex partial seizures, not intractable, without status epilepticus: Secondary | ICD-10-CM

## 2019-12-24 MED ORDER — CHLORPROMAZINE HCL 25 MG PO TABS
25.0000 mg | ORAL_TABLET | Freq: Three times a day (TID) | ORAL | 0 refills | Status: DC | PRN
Start: 2019-12-24 — End: 2020-04-11

## 2019-12-24 NOTE — Progress Notes (Signed)
Radiation Oncology         (336) [phone] ________________________________  Initial Outpatient Consultation - Conducted via telephone due to current COVID-19 concerns for limiting patient exposure  I spoke with the patient to conduct this consult visit via telephone to spare the patient unnecessary potential exposure in the healthcare setting during the current COVID-19 pandemic. The patient was notified in advance and was offered a [place] meeting to allow for face to face communication but unfortunately reported that they did not have the appropriate resources/technology to support such a visit and instead preferred to proceed with a telephone consult.    Name: Jared Shea.        MRN: [id number]  Date of Service: 12/24/2019 DOB: [birth year]  ZG:[id number], Jared Kluver, MD  Ashok Pall, MD     REFERRING PHYSICIAN: Ashok Pall, MD   DIAGNOSIS: The primary encounter diagnosis was Partial symptomatic epilepsy with complex partial seizures, not intractable, without status epilepticus ([place]). A diagnosis of Brain metastases [facility]) was also pertinent to this visit.   HISTORY OF PRESENT ILLNESS: Jared Shea. is a 64 y.o. male seen at the request of Dr. Christella Noa for newly diagnosed brain metastases.  The patient had facial numbness, and headache as well as facial twitching and weakness in the upper extremities in July 2021.  He had had some episodes of aphasia as well and was started on Keppra.  He went to the ER on 12/14/2019 and a CT and MRI revealed a mass in the right frontal lobe along the periphery with significant surrounding edema, he subsequently also underwent chest x-ray and CT chest that day which revealed mediastinal and left hilar adenopathy, a 4 mm left upper lobe nodule, and low attenuating focus inferior to the left hilum likely felt to be in no other node.  Atherosclerotic changes were identified, and an abdomen pelvis scan did not show any evidence of further metastatic concerns, and the MRI  however revealed a 13.5 mm lesion in the right posterior frontal lobe of the brain.  He was started on dexamethasone 4 mg twice daily, and has undergone evaluation with Dr. Ander Slade in pulmonary and underwent bronchoscopy with EBUS on 12/21/19. Pathology from cytology of a subcarinal node indicated a non small cell lung cancer. Further histology was not available. He's seen today to discuss options of treatment for his cancer.    PREVIOUS RADIATION THERAPY: No   PAST MEDICAL HISTORY:  Past Medical History:  Diagnosis Date  . Brain mass   . Bursitis of right hip   . Chest pain 08/12/2018  . Chronic cough   . Dependence on nicotine from cigarettes   . Essential hypertension 07/28/2018  . Seizures ([place])   . Viral illness 03/23/2019       PAST SURGICAL HISTORY: Past Surgical History:  Procedure Laterality Date  . ENDOBRONCHIAL ULTRASOUND N/A 12/21/2019   Procedure: ENDOBRONCHIAL ULTRASOUND;  Surgeon: Laurin Coder, MD;  Location: [facility] ENDOSCOPY;  Service: Pulmonary;  Laterality: N/A;  . FINE NEEDLE ASPIRATION  12/21/2019   Procedure: FINE NEEDLE ASPIRATION (FNA) LINEAR;  Surgeon: Laurin Coder, MD;  Location: [facility] ENDOSCOPY;  Service: Pulmonary;;  . NO PAST SURGERIES    . VIDEO BRONCHOSCOPY N/A 12/21/2019   Procedure: VIDEO BRONCHOSCOPY WITHOUT FLUORO;  Surgeon: Laurin Coder, MD;  Location: [facility] ENDOSCOPY;  Service: Pulmonary;  Laterality: N/A;     FAMILY HISTORY:  Family History  Problem Relation Age of Onset  . Heart disease Mother  CABG  . Kidney disease Sister   . Diabetes Brother   . Kidney disease Brother        KIDNEY TRANSPLANT  . Hypertension Sister   . Healthy Daughter   . Healthy Daughter   . Healthy Son      SOCIAL HISTORY:  reports that he has been smoking cigarettes. He started smoking about 49 years ago. He has a 24.00 pack-year smoking history. He has never used smokeless tobacco. He reports current alcohol use. He reports that he does not use drugs.  The patient is divorced. [place] in [place]   ALLERGIES: Penicillins   MEDICATIONS:  Current Outpatient Medications  Medication Sig Dispense Refill  . dexamethasone (DECADRON) 4 MG tablet Take 1 tablet (4 mg total) by mouth 2 (two) times daily for 14 days. 28 tablet 0  . ibuprofen (ADVIL) 400 MG tablet Take 400 mg by mouth every 6 (six) hours as needed for headache.    . levETIRAcetam (KEPPRA) 750 MG tablet Take 1 tablet (750 mg total) by mouth 2 (two) times daily. 180 tablet 4  . losartan-hydrochlorothiazide (HYZAAR) 50-12.5 MG tablet Take 1 tablet by mouth daily. 60 tablet 5  . nicotine polacrilex (NICORETTE) 4 MG gum Take 1 each (4 mg total) by mouth as needed for smoking cessation. 40 tablet 2   No current facility-administered medications for this encounter.     REVIEW OF SYSTEMS: On review of systems, the patient reports that he is doing well overall. He denies any chest pain, shortness of breath, cough, fevers, chills, night sweats, unintended weight changes. He does report improvement in headaches and numbness since he started dexamethasone. He denies any bowel or bladder disturbances, and denies abdominal pain, nausea or vomiting. He denies any new musculoskeletal or joint aches or pains. A complete review of systems is obtained and is otherwise negative.     PHYSICAL EXAM:  Wt Readings from Last 3 Encounters:  12/23/19 180 lb 5 oz (81.8 kg)  12/21/19 179 lb (81.2 kg)  12/14/19 180 lb (81.6 kg)   Unable to assess due to encounter type.  ECOG = 1  0 - Asymptomatic (Fully active, able to carry on all predisease activities without restriction)  1 - Symptomatic but completely ambulatory (Restricted in physically strenuous activity but ambulatory and able to carry out work of a light or sedentary nature. For example, light housework, office work)  2 - Symptomatic, <50% in bed during the day (Ambulatory and capable of all self care but unable to carry out any work  activities. Up and about more than 50% of waking hours)  3 - Symptomatic, >50% in bed, but not bedbound (Capable of only limited self-care, confined to bed or chair 50% or more of waking hours)  4 - Bedbound (Completely disabled. Cannot carry on any self-care. Totally confined to bed or chair)  5 - Death   Eustace Pen MM, Creech RH, Tormey DC, et al. [phone]). "Toxicity and response criteria of the [facility]". [place] Oncol. 5 (6): 649-55    LABORATORY DATA:  Lab Results  Component Value Date   WBC 7.1 12/14/2019   HGB 13.9 12/14/2019   HCT 41.0 12/14/2019   MCV 89.7 12/14/2019   PLT 407 (H) 12/14/2019   Lab Results  Component Value Date   NA 137 12/15/2019   K 4.1 12/15/2019   CL 107 12/15/2019   CO2 23 12/15/2019   Lab Results  Component Value Date   ALT 32  12/14/2019   AST 34 12/14/2019   ALKPHOS 68 12/14/2019   BILITOT 0.7 12/14/2019      RADIOGRAPHY: DG Chest 2 View  Result Date: 12/14/2019 CLINICAL DATA:  64 year old male with shortness of breath. Brain metastasis. History of smoking. EXAM: CHEST - 2 VIEW COMPARISON:  Chest radiograph dated 06/23/2018. FINDINGS: Background of emphysema. No focal consolidation, pleural effusion, or pneumothorax. The cardiac silhouette is within limits. No acute osseous pathology. IMPRESSION: No active cardiopulmonary disease. Electronically Signed   By: Anner Crete M.D.   On: 12/14/2019 19:53   CT HEAD WO CONTRAST  Result Date: 12/14/2019 CLINICAL DATA:  Seizure without known head injury. EXAM: CT HEAD WITHOUT CONTRAST TECHNIQUE: Contiguous axial images were obtained from the base of the skull through the vertex without intravenous contrast. COMPARISON:  12/27/2019 FINDINGS: Brain: The brainstem and cerebellum are normal. Left cerebral hemispheres normal. In the right posterior frontal and frontoparietal junction region, there is a pronounced vasogenic edema pattern suggesting the presence of an underlying  tumor. That is not specifically clearly defined by this noncontrast head CT. Cerebritis is less likely. MRI with and without contrast recommended. No evidence of hemorrhage, hydrocephalus or extra-axial collection. No midline shift. Vascular: There is atherosclerotic calcification of the major vessels at the base of the brain. Skull: Negative Sinuses/Orbits: Clear/normal Other: None IMPRESSION: Prominent region of vasogenic edema in the right posterior frontal region and frontoparietal junction region worrisome for the presence of an occult underlying mass. Cerebritis is less likely. MRI with and without contrast recommended. Electronically Signed   By: Nelson Chimes M.D.   On: 12/14/2019 14:49   CT Chest W Contrast  Result Date: 12/14/2019 CLINICAL DATA:  64 year old male with brain mass/metastasis. Cancer of unknown primary. EXAM: CT CHEST, ABDOMEN, AND PELVIS WITH CONTRAST TECHNIQUE: Multidetector CT imaging of the chest, abdomen and pelvis was performed following the standard protocol during bolus administration of intravenous contrast. CONTRAST:  127m OMNIPAQUE IOHEXOL 300 MG/ML  SOLN COMPARISON:  Chest radiograph dated 12/14/2019. FINDINGS: CT CHEST FINDINGS Cardiovascular: There is no cardiomegaly or pericardial effusion. Mild three-vessel coronary vascular calcification. The thoracic aorta is unremarkable. The origins of the great vessels of the aortic arch appear patent as visualized. There is a 12 mm hypodense focus in the inferior left hilum (34/3), likely mildly enlarged hilar lymph node. A pulmonary embolus is less likely but not excluded. The pulmonary arteries are otherwise unremarkable as visualized. Mediastinum/Nodes: There is mediastinal adenopathy measuring up to 2 cm in short axis in the subcarinal region. Mildly enlarged lymph node in the aortopulmonic window measures 15 mm. The esophagus and the thyroid gland are grossly unremarkable. No mediastinal fluid collection. Lungs/Pleura: Minimal  bibasilar subpleural atelectasis. There is a 4 mm left upper lobe nodule (51/4). No focal consolidation, pleural effusion, or pneumothorax. The central airways are patent. Musculoskeletal: Top-normal left supraclavicular lymph node measures 8 mm in short axis. No axillary adenopathy. No acute osseous pathology. CT ABDOMEN PELVIS FINDINGS No intra-abdominal free air or free fluid. Hepatobiliary: No focal liver abnormality is seen. No gallstones, gallbladder wall thickening, or biliary dilatation. Pancreas: Unremarkable. No pancreatic ductal dilatation or surrounding inflammatory changes. Spleen: Normal in size without focal abnormality. Adrenals/Urinary Tract: The adrenal glands unremarkable. There is no hydronephrosis on either side. There is symmetric enhancement and excretion of contrast by both kidneys. The visualized ureters and urinary bladder appear unremarkable. Stomach/Bowel: There is sigmoid diverticulosis without active inflammatory changes. There is no bowel obstruction or active inflammation. The appendix is normal.  Vascular/Lymphatic: Moderate aortoiliac atherosclerotic disease. The IVC is unremarkable. No portal venous gas. Mildly enlarged peripancreatic lymph node measures 16 mm (coronal 60/7). Reproductive: The prostate and seminal vesicles are grossly unremarkable. Other: None Musculoskeletal: Degenerative changes of the spine. No acute osseous pathology. IMPRESSION: 1. Mediastinal and left hilar adenopathy of indeterminate etiology. Clinical correlation recommended. Bronchoscopy and tissue sampling may provide better evaluation if clinically indicated. 2. A 4 mm left upper lobe pulmonary nodule. No follow-up needed if patient is low-risk. Non-contrast chest CT can be considered in 12 months if patient is high-risk. This recommendation follows the consensus statement: Guidelines for Management of Incidental Pulmonary Nodules Detected on CT Images: From the Fleischner Society 2017; Radiology 2017;  284:228-243. 3. Low attenuating focus inferior to the left hilum, likely a mildly enlarged lymph node. A pulmonary embolism is less likely. 4. Sigmoid diverticulosis. No bowel obstruction. Normal appendix. 5. Aortic Atherosclerosis (ICD10-I70.0). Electronically Signed   By: Anner Crete M.D.   On: 12/14/2019 21:40   MR Brain W and Wo Contrast  Result Date: 12/14/2019 CLINICAL DATA:  Seizure. Abnormal CT head headache and blurred vision. EXAM: MRI HEAD WITHOUT AND WITH CONTRAST TECHNIQUE: Multiplanar, multiecho pulse sequences of the brain and surrounding structures were obtained without and with intravenous contrast. CONTRAST:  53m GADAVIST GADOBUTROL 1 MMOL/ML IV SOLN COMPARISON:  CT head 12/14/2019 FINDINGS: Brain: Large area of edema in the right posterior frontal lobe. Within this edema there is a 13.5 mm enhancing mass lesion which is located peripherally. This shows fairly intense enhancement with small internal cystic changes. The mass is intra-axial but very close to the surface of the brain. No second lesion identified. No other edema Ventricle size normal.  No midline shift.  Negative for hemorrhage. Vascular: Normal arterial flow voids. Skull and upper cervical spine: No focal skeletal lesion. Sinuses/Orbits: Paranasal sinuses clear.  Negative orbit Other: None IMPRESSION: 13.5 mm enhancing mass in the right posterior frontal cortex over the convexity. Large amount of surrounding vasogenic edema. This is most likely a solitary metastasis. Primary brain tumor a less likely consideration. Electronically Signed   By: CFranchot GalloM.D.   On: 12/14/2019 19:29   CT ABDOMEN PELVIS W CONTRAST  Result Date: 12/14/2019 CLINICAL DATA:  64year old male with brain mass/metastasis. Cancer of unknown primary. EXAM: CT CHEST, ABDOMEN, AND PELVIS WITH CONTRAST TECHNIQUE: Multidetector CT imaging of the chest, abdomen and pelvis was performed following the standard protocol during bolus administration of  intravenous contrast. CONTRAST:  1050mOMNIPAQUE IOHEXOL 300 MG/ML  SOLN COMPARISON:  Chest radiograph dated 12/14/2019. FINDINGS: CT CHEST FINDINGS Cardiovascular: There is no cardiomegaly or pericardial effusion. Mild three-vessel coronary vascular calcification. The thoracic aorta is unremarkable. The origins of the great vessels of the aortic arch appear patent as visualized. There is a 12 mm hypodense focus in the inferior left hilum (34/3), likely mildly enlarged hilar lymph node. A pulmonary embolus is less likely but not excluded. The pulmonary arteries are otherwise unremarkable as visualized. Mediastinum/Nodes: There is mediastinal adenopathy measuring up to 2 cm in short axis in the subcarinal region. Mildly enlarged lymph node in the aortopulmonic window measures 15 mm. The esophagus and the thyroid gland are grossly unremarkable. No mediastinal fluid collection. Lungs/Pleura: Minimal bibasilar subpleural atelectasis. There is a 4 mm left upper lobe nodule (51/4). No focal consolidation, pleural effusion, or pneumothorax. The central airways are patent. Musculoskeletal: Top-normal left supraclavicular lymph node measures 8 mm in short axis. No axillary adenopathy. No acute osseous pathology. CT  ABDOMEN PELVIS FINDINGS No intra-abdominal free air or free fluid. Hepatobiliary: No focal liver abnormality is seen. No gallstones, gallbladder wall thickening, or biliary dilatation. Pancreas: Unremarkable. No pancreatic ductal dilatation or surrounding inflammatory changes. Spleen: Normal in size without focal abnormality. Adrenals/Urinary Tract: The adrenal glands unremarkable. There is no hydronephrosis on either side. There is symmetric enhancement and excretion of contrast by both kidneys. The visualized ureters and urinary bladder appear unremarkable. Stomach/Bowel: There is sigmoid diverticulosis without active inflammatory changes. There is no bowel obstruction or active inflammation. The appendix is  normal. Vascular/Lymphatic: Moderate aortoiliac atherosclerotic disease. The IVC is unremarkable. No portal venous gas. Mildly enlarged peripancreatic lymph node measures 16 mm (coronal 60/7). Reproductive: The prostate and seminal vesicles are grossly unremarkable. Other: None Musculoskeletal: Degenerative changes of the spine. No acute osseous pathology. IMPRESSION: 1. Mediastinal and left hilar adenopathy of indeterminate etiology. Clinical correlation recommended. Bronchoscopy and tissue sampling may provide better evaluation if clinically indicated. 2. A 4 mm left upper lobe pulmonary nodule. No follow-up needed if patient is low-risk. Non-contrast chest CT can be considered in 12 months if patient is high-risk. This recommendation follows the consensus statement: Guidelines for Management of Incidental Pulmonary Nodules Detected on CT Images: From the Fleischner Society 2017; Radiology 2017; 284:228-243. 3. Low attenuating focus inferior to the left hilum, likely a mildly enlarged lymph node. A pulmonary embolism is less likely. 4. Sigmoid diverticulosis. No bowel obstruction. Normal appendix. 5. Aortic Atherosclerosis (ICD10-I70.0). Electronically Signed   By: Anner Crete M.D.   On: 12/14/2019 21:40   DG CHEST PORT 1 VIEW  Result Date: 12/21/2019 CLINICAL DATA:  Post bronchoscopy. EXAM: PORTABLE CHEST 1 VIEW COMPARISON:  12/14/2019 FINDINGS: Lungs are adequately inflated without consolidation or effusion. Possible minimal linear atelectasis left base. Cardiomediastinal silhouette and remainder the exam is unchanged. IMPRESSION: No active disease. Electronically Signed   By: Marin Olp M.D.   On: 12/21/2019 09:46       IMPRESSION/PLAN: 1. Stage IV, NSCLC of the LUL involving the right frontal lobe of the brain. Dr. Lisbeth Renshaw discusses the pathology findings and reviews the nature of advanced stage lung disease.  He reviews the pathology findings and work-up thus far.  He recommends proceeding with  a course of stereotactic radiosurgery to the brain.  He is scheduled as well for a PET scan on 01/01/2020, at that point in time we will follow-up with these results, he may be a candidate for chemoradiation as well.  He also understands the need for a 3T MRI scan for planning purposes and to rule out other sites of disease that may be needing treatment in the brain.  We discussed the risks, benefits, short, and long term effects of radiotherapy, and the patient is interested in proceeding. Dr. Lisbeth Renshaw discusses the delivery and logistics of radiotherapy and anticipates a single fraction of stereotactic radiosurgery [facility]).  He has met with our brain navigator on the call as well and she will coordinate his simulation, we will then also coordinate treatment with Dr. Christella Noa and Dr. Lisbeth Renshaw.  The patient will also meet with Dr. Julien Nordmann, and it appears that a referral has already been placed to discuss further plans for systemic therapy, +/- radiotherapy. He will also continue on steroids as previously prescribed until he receives SRS. 2. Research project for understanding the patient experience.  In our department we are currently looking at a better way to understand patient experience for those undergoing radiotherapy.  She was counseled on the opportunity to participate  in this project, after discussing the logistics of this, the patient is interested in possibly using an open face mask based on our randomization protocol.    Given current concerns for patient exposure during the COVID-19 pandemic, this encounter was conducted via telephone.  The patient has provided two factor identification and has given verbal consent for this type of encounter and has been advised to only accept a meeting of this type in a secure network environment. The time spent during this encounter was 60 minutes including preparation, discussion, and coordination of the patient's care. The attendants for this meeting include Blenda Nicely, RN, Dr. Lisbeth Renshaw, Hayden Pedro  and [name] and his daughter Nymir Ringler.  During the encounter,  Blenda Nicely, RN, Dr. Lisbeth Renshaw, and Hayden Pedro were located at [facility] Radiation Oncology Department.  [name]. was located at home his daughter was remote as well.    The above documentation reflects my direct findings during this shared patient visit. Please see the separate note by Dr. Lisbeth Renshaw on this date for the remainder of the patient's plan of care.    Carola Rhine, PAC

## 2019-12-24 NOTE — Progress Notes (Signed)
[facility] CSW Progress Notes  [place] Initial Psychosocial Assessment Clinical Social Work  Clinical Social Work contacted by phone to assess psychosocial, emotional, mental health, and spiritual needs of the patient.   Barriers to care/review of distress screen:  - Transportation:  Do you anticipate any problems getting to appointments?  Do you have someone who can help run errands for you if you need it?  States he has been driving himself to appointments, but was advised that he should not be driving.  Has daughter and friends who can drive some of the time, but requests referral to [facility] for appts at [facility].  Referral made.   - Help at home:  What is your living situation (alone, family, other)?  If you are physically unable to care for yourself, who would you call on to help you?  Did not ask. - Support system:  What does your support system look like?  Who would you call on if you needed some kind of practical help?  What if you needed someone to talk to for emotional support?  Daughter, also has friends who help w rides as needed - Finances:  Are you concerned about finances.  Considering returning to work?  If not, applying for disability?  Has been out of work for several months since his diagnosis.  No income.  May plan to investigate filing for disability and/or retirement Social Security.  Has Medicare and Medicaid.    Identifications of barriers to care:  Transportation and income  Availability of community resources:  Referred to [facility] transportation, advised that he needs to contact [place] office in order to discuss obtaining his [place] retirement benefits, referred to [place] Social Worker follow up needed: No. please reconsult if needed  Edwyna Shell, [place] Worker Phone:  [phone]

## 2019-12-24 NOTE — Telephone Encounter (Signed)
Pt has not been seen for an appt yet at the office. Pt is scheduled for a consult with Dr. Silas Flood 8/25.  No DPR on file with pt's daughter Elza Rafter listed.  Dr. Jenetta Downer is the one who performed the bronch. Dr. Jenetta Downer, please advise if you would be okay calling pt's daughter or if you believe this should wait until pt's consult with Dr. Silas Flood.

## 2019-12-24 NOTE — Addendum Note (Signed)
Addended by: Pincus Large on: 12/24/2019 02:15 PM   Modules accepted: Orders

## 2019-12-24 NOTE — Telephone Encounter (Signed)
Called patient's daughter on [phone]  Left a message about the upcoming appointment, she may call back the office if any specific questions  Was prepared to answer any questions that she may have, patient previously had not established care with any of [place], so it is appropriate if Dr. Silas Flood is willing to see on the 25th.

## 2019-12-24 NOTE — Progress Notes (Signed)
I received referral on Mr. Doswell today.  I updated new patient coordinator to call and schedule him to be seen with Cassie PA on 01/05/20.

## 2019-12-24 NOTE — Telephone Encounter (Signed)
1.  Order PET scan  2.  Referral to oncology     -Metastatic non-small cell lung cancer  3.  Hiccups unlikely related to Bronchoscopy     -If still persistent     -Send prescription for chlorpromazine 25 mg p.o. 3 times daily as needed-for 7 days .

## 2019-12-24 NOTE — Progress Notes (Signed)
I received a message that patient needs another appt.  I updated new patient coordinator of another appt time and date.

## 2019-12-24 NOTE — Telephone Encounter (Signed)
Spoke with patient and advised him of Dr. Judson Roch recommendations.  Chlorpromazine 25 mg sent to patient's pharmacy.  Pharmacy verified.  PET scan ordered and Referral to Oncology placed.  Nothing further placed.

## 2019-12-25 ENCOUNTER — Telehealth: Payer: Self-pay

## 2019-12-25 ENCOUNTER — Telehealth: Payer: Self-pay | Admitting: Physician Assistant

## 2019-12-25 ENCOUNTER — Other Ambulatory Visit (HOSPITAL_COMMUNITY): Payer: Medicare Other

## 2019-12-25 NOTE — Telephone Encounter (Signed)
Received a new pt referral from [facility] for a new dx of lung cancer. Jared Shea has been scheduled to see Cassie on 8/23 at 930am w/labs at 9am. Appt date and time has been provided to the pt's daughter. She asked that transportation be provided for her father. I told her I would send a request to our transportation coordinators.

## 2019-12-25 NOTE — Telephone Encounter (Signed)
Pt states the pharmacy did not let him pick up dexamethasone (DECADRON) 4 MG tablet. Please call pt back.

## 2019-12-25 NOTE — Telephone Encounter (Signed)
Returned call to patient. Patient's bottle of decadron states 28 tabs. He feels certain he was not given 28 tabs. He is requesting more tabs be sent to pharmacy. Explained that was not appropriate as the correct number was sent in original Rx or his bottle would not say 28 tabs. He is again advised to contact pharmacy if he feels they did not give him the full amount. Patient became upset and disconnected call. Please see phone note from yesterday. Hubbard Hartshorn, BSN, RN-BC

## 2019-12-26 ENCOUNTER — Encounter (HOSPITAL_COMMUNITY): Payer: Self-pay

## 2019-12-26 ENCOUNTER — Emergency Department (HOSPITAL_COMMUNITY)
Admission: EM | Admit: 2019-12-26 | Discharge: 2019-12-26 | Disposition: A | Discharge status: Home / Self Care | Payer: Medicare Other | Attending: Emergency Medicine | Admitting: Emergency Medicine

## 2019-12-26 ENCOUNTER — Other Ambulatory Visit: Payer: Self-pay

## 2019-12-26 DIAGNOSIS — C3490 Malignant neoplasm of unspecified part of unspecified bronchus or lung: Secondary | ICD-10-CM | POA: Diagnosis present

## 2019-12-26 DIAGNOSIS — Z760 Encounter for issue of repeat prescription: Secondary | ICD-10-CM | POA: Insufficient documentation

## 2019-12-26 DIAGNOSIS — Z5321 Procedure and treatment not carried out due to patient leaving prior to being seen by health care provider: Secondary | ICD-10-CM | POA: Diagnosis not present

## 2019-12-26 NOTE — ED Notes (Signed)
Pt states he is leaving

## 2019-12-26 NOTE — ED Triage Notes (Signed)
Patient here requesting refill for steroid, patient has stage 4 lung cancer and out of medication, no other concerns today

## 2019-12-28 ENCOUNTER — Other Ambulatory Visit: Payer: Self-pay | Admitting: Neurosurgery

## 2019-12-28 ENCOUNTER — Other Ambulatory Visit: Payer: Self-pay | Admitting: Radiation Oncology

## 2019-12-28 ENCOUNTER — Encounter: Payer: Medicare Other | Admitting: Internal Medicine

## 2019-12-28 ENCOUNTER — Telehealth: Payer: Self-pay | Admitting: *Deleted

## 2019-12-28 MED ORDER — DEXAMETHASONE 4 MG PO TABS: 4.0000 mg | ORAL_TABLET | Freq: Two times a day (BID) | ORAL | 0 refills | Status: DC

## 2019-12-28 NOTE — Telephone Encounter (Signed)
Pt when he is transferred to nurse states, I was talking to that other nurse, informed pt that this is triage nurse and he states "here we go again, I just want to see my doctor about these headaches" nurse attempted questions and pt became agitated. He did state he is out of the medicine, "that prednisone medicine."  appt not available with yellow team, 8/16 at 1315, pt is agreeable

## 2019-12-28 NOTE — Telephone Encounter (Signed)
Left the patient a voicemail letting him know that we have sent in a refill for his decadron.  I asked that he give me a call back to verify how he is taking this medication. Call back number left.  Will try call again as time permits.    Gloriajean Dell. Leonie Green, BSN

## 2019-12-29 ENCOUNTER — Other Ambulatory Visit: Payer: Self-pay | Admitting: Student

## 2019-12-29 ENCOUNTER — Other Ambulatory Visit: Payer: Self-pay | Admitting: Internal Medicine

## 2019-12-29 NOTE — Progress Notes (Signed)
Has armband been applied?  Yes  Does patient have an allergy to IV contrast dye?: No   Has patient ever received premedication for IV contrast dye?: n/a  Does patient take metformin?: No  If patient does take metformin when was the last dose: n/a  Date of lab work: 12/15/2019 BUN: 16 CR: 0.69 EGfr: >60  IV site: Right AC  Has IV site been added to flowsheet?  Yes

## 2019-12-30 ENCOUNTER — Other Ambulatory Visit: Payer: Self-pay

## 2019-12-30 ENCOUNTER — Ambulatory Visit (INDEPENDENT_AMBULATORY_CARE_PROVIDER_SITE_OTHER): Payer: Medicare Other | Admitting: Internal Medicine

## 2019-12-30 ENCOUNTER — Ambulatory Visit
Admission: RE | Admit: 2019-12-30 | Discharge: 2019-12-30 | Disposition: A | Discharge status: Home / Self Care | Payer: Medicare Other | Source: Ambulatory Visit | Attending: Radiation Oncology | Admitting: Radiation Oncology

## 2019-12-30 DIAGNOSIS — C3490 Malignant neoplasm of unspecified part of unspecified bronchus or lung: Secondary | ICD-10-CM

## 2019-12-30 DIAGNOSIS — Z510 Encounter for antineoplastic radiation therapy: Secondary | ICD-10-CM | POA: Diagnosis present

## 2019-12-30 DIAGNOSIS — G40209 Localization-related (focal) (partial) symptomatic epilepsy and epileptic syndromes with complex partial seizures, not intractable, without status epilepticus: Secondary | ICD-10-CM

## 2019-12-30 DIAGNOSIS — C7931 Secondary malignant neoplasm of brain: Secondary | ICD-10-CM | POA: Insufficient documentation

## 2019-12-30 MED ORDER — SODIUM CHLORIDE 0.9% FLUSH
10.0000 mL | Freq: Once | INTRAVENOUS | Status: AC
  Administered 2019-12-30: 10 mL via INTRAVENOUS

## 2019-12-30 NOTE — Progress Notes (Signed)
  [facility] Telephone Encounter Continuity Care Appointment  HPI:   This telephone encounter was created for [name]. on 12/30/2019 for the following purpose/cc hospital follow up;new lung cancer diagnosis.   Past Medical History:  Past Medical History:  Diagnosis Date  . Brain mass   . Bursitis of right hip   . Chest pain 08/12/2018  . Chronic cough   . Dependence on nicotine from cigarettes   . Essential hypertension 07/28/2018  . Seizures ([place])   . Viral illness 03/23/2019      ROS:   Negative except as noted in HPI   Assessment / Plan / Recommendations:   Please see A&P under problem oriented charting for assessment of the patient's acute and chronic medical conditions.   As always, pt is advised that if symptoms worsen or new symptoms arise, they should go to an urgent care facility or to to ER for further evaluation.   Consent and Medical Decision Making:   Patient discussed with Dr. Philipp Ovens  This is a telephone encounter between Jared Shea. and Jared Shea on 12/30/2019 for hospital follow up. The visit was conducted with the patient located at home and Jared Shea at [facility]. The patient's identity was confirmed using their DOB and current address. The patient has consented to being evaluated through a telephone encounter and understands the associated risks (an examination cannot be done and the patient may need to come in for an appointment) / benefits (allows the patient to remain at home, decreasing exposure to coronavirus). I personally spent 10 minutes on medical discussion.

## 2019-12-30 NOTE — Addendum Note (Signed)
Encounter addended by: Cori Razor, RN on: 12/30/2019 9:59 AM  Actions taken: Order list changed, Diagnosis association updated, MAR administration accepted

## 2019-12-31 ENCOUNTER — Telehealth: Payer: Self-pay | Admitting: Radiation Oncology

## 2019-12-31 ENCOUNTER — Other Ambulatory Visit: Payer: Medicare Other

## 2019-12-31 ENCOUNTER — Ambulatory Visit
Admission: RE | Admit: 2019-12-31 | Discharge: 2019-12-31 | Disposition: A | Discharge status: Home / Self Care | Payer: Medicare Other | Source: Ambulatory Visit | Attending: Radiation Oncology | Admitting: Radiation Oncology

## 2019-12-31 DIAGNOSIS — Z85118 Personal history of other malignant neoplasm of bronchus and lung: Secondary | ICD-10-CM | POA: Insufficient documentation

## 2019-12-31 DIAGNOSIS — C3490 Malignant neoplasm of unspecified part of unspecified bronchus or lung: Secondary | ICD-10-CM | POA: Insufficient documentation

## 2019-12-31 DIAGNOSIS — C7949 Secondary malignant neoplasm of other parts of nervous system: Secondary | ICD-10-CM

## 2019-12-31 DIAGNOSIS — C7931 Secondary malignant neoplasm of brain: Secondary | ICD-10-CM

## 2019-12-31 HISTORY — DX: Personal history of other malignant neoplasm of bronchus and lung: Z85.118

## 2019-12-31 MED ORDER — GADOBENATE DIMEGLUMINE 529 MG/ML IV SOLN
15.0000 mL | Freq: Once | INTRAVENOUS | Status: AC | PRN
  Administered 2019-12-31: 15 mL via INTRAVENOUS

## 2019-12-31 NOTE — Assessment & Plan Note (Addendum)
[name] denies any additional seizures since initiation of Keppra.  Assessment/plan: New onset seizure secondary to brain metastasis with vasogenic edema.  Stable on Keppra at this time.  Has follow-up set up with neurology.  -Continue Keppra 750 mg twice daily

## 2019-12-31 NOTE — Telephone Encounter (Signed)
I called and spoke with the patient to let him know his MRI report looked good and that we would discuss his case further on Monday in conference. I also let his daughter know the results as well.

## 2019-12-31 NOTE — Assessment & Plan Note (Signed)
Jared Shea states that he has been having good days and bad days since diagnosis.  His good days he feels himself and hopeful.  On his bad days he feels fatigued and with no energy.  Overall Jared Shea states that he is hopeful and is not feeling down or depressed.  He is looking forward to meeting with oncology to start treatment.  He is working on smoking cessation.  He has follow-up set up with oncology and started radiation today.  Assessment/plan: New diagnosis after patient was admitted to the hospital for headaches and new onset seizures, was found to have a brain metastasis.  Bronchoscopy was pursued for pulmonary nodules and was positive for non-small cell lung carcinoma.  He has started radiation today and has follow-up with oncology on the 23rd.  Patient denies any depression or anxiety at this time associated with his new diagnosis.  Encouraged that should he have any difficulty to please reach out to the clinic.  -We will follow up oncology's recommendations

## 2020-01-01 ENCOUNTER — Encounter (HOSPITAL_COMMUNITY)
Admission: RE | Admit: 2020-01-01 | Discharge: 2020-01-01 | Disposition: A | Discharge status: Home / Self Care | Payer: Medicare Other | Source: Ambulatory Visit | Attending: Pulmonary Disease | Admitting: Pulmonary Disease

## 2020-01-01 ENCOUNTER — Other Ambulatory Visit: Payer: Self-pay

## 2020-01-01 DIAGNOSIS — C3490 Malignant neoplasm of unspecified part of unspecified bronchus or lung: Secondary | ICD-10-CM | POA: Diagnosis not present

## 2020-01-01 DIAGNOSIS — E041 Nontoxic single thyroid nodule: Secondary | ICD-10-CM | POA: Insufficient documentation

## 2020-01-01 DIAGNOSIS — R590 Localized enlarged lymph nodes: Secondary | ICD-10-CM | POA: Insufficient documentation

## 2020-01-01 DIAGNOSIS — Z510 Encounter for antineoplastic radiation therapy: Secondary | ICD-10-CM | POA: Diagnosis not present

## 2020-01-01 LAB — GLUCOSE, CAPILLARY
Glucose-Capillary: 399 mg/dL — ABNORMAL HIGH (ref 70–99)
Glucose-Capillary: 262 mg/dL — ABNORMAL HIGH (ref 70–99)
Glucose-Capillary: 326 mg/dL — ABNORMAL HIGH (ref 70–99)
Glucose-Capillary: 405 mg/dL — ABNORMAL HIGH (ref 70–99)

## 2020-01-01 MED ORDER — FLUDEOXYGLUCOSE F - 18 (FDG) INJECTION
10.2000 | Freq: Once | INTRAVENOUS | Status: AC
  Administered 2020-01-01: 10.2 via INTRAVENOUS

## 2020-01-01 NOTE — Progress Notes (Signed)
Internal Medicine Clinic Attending  Case discussed with Dr. Charleen Kirks. We reviewed the resident's history and exam and pertinent patient test results.  I agree with the assessment, diagnosis, and plan of care documented in the resident's note.

## 2020-01-02 NOTE — Progress Notes (Signed)
[place] [phone]   [phone]  CONSULT NOTE  REFERRING PHYSICIAN: Dr. Ander Slade  REASON FOR CONSULTATION:  Non-Small Cell Carcinoma  HPI Jared Shea. is a 64 y.o. male with a past medical history significant for tobacco use and hypertension is referred to the clinic for evaluation of non-small cell carcinoma.  The patient is a poor historian and much of the history was obtained by his sister, who was present during the encounter today. The patient's evaluation began on 12/14/2019. He was seen in the ER this day for the chief complaint of seizures and a headache reportedly for 3 months.  He had a CT scan and a brain MRI performed which noted a right frontal lobe lesion with vasogenic edema.  He was given IV Decadron.  He subsequently had a chest x-ray performed and a CT scan of the chest, abdomen, and pelvis performed that day which showed mediastinal and left hilar adenopathy of indeterminate etiology.   The patient is currently being treated with Decadron, Keppra, and is planning on undergoing SRS tomorrow and a craniotomy under the care of Dr. Christella Noa on 8/26.   The patient had a bronchoscopy performed by Dr. Ander Slade and the pathology was consistent with non-small cell carcinoma. The patient had a staging PET scan performed on 01/01/2020 which showed hypermetabolic subcarinal and LEFT hilar adenopathy consistent metastatic adenopathy. There was also a hypermetabolic nodule in the RIGHT lobe of the thyroid gland. This is favored incidental finding representing adenoma however cannot exclude thyroid carcinoma or rare thyroid cancer metastasis.  Today, the patient is feeling fatigued and weak.  His main concern is related to weight loss and decreased appetite.  He reportedly lost 10 pounds in the last week or so.  He denies drinking any nutritional supplemental drinks such as boost or Ensure.  He denies any recent fever, chills, or night sweats.  He  denies any chest pain, cough, or hemoptysis but reports very mild shortness of breath.  He denies any diarrhea or constipation.  He reports he has had some nausea since his bronchoscopy last week.  He reportedly had vomited 3 times yesterday and does not have a current prescription for an antiemetic.  He denies any headache but reports visual blurring bilaterally.  He also has been having some hiccups for which she is prescribed Thorazine.  He also reports that he has some insomnia.  The patient's blood sugar has been elevated on several routine lab studies.  Of note, the patient is taking Decadron for his brain metastases.  The patient denies any prior history of diagnosed diabetes.  The patient's family history consists of a mother who had breast cancer, myocardial infarction, diabetes mellitus.  The patient's father had hypertension and a stroke.  He denies any family history of malignancy.  The patient used to work at an [employer].  He is single and has 3 children.  He presently drinks approximately 2-3 beers on the weekends.  He smoked approximately 30 to 40 years averaging 1 pack/day.  He is currently trying to cut back.  The patient denies any current drug use but used to use crack cocaine and quit about 11 years ago.   HPI  Past Medical History:  Diagnosis Date  . Brain mass   . Bursitis of right hip   . Chest pain 08/12/2018  . Chronic cough   . Dependence on nicotine from cigarettes   . Essential hypertension 07/28/2018  . Seizures ([place])   .  Viral illness 03/23/2019    Past Surgical History:  Procedure Laterality Date  . ENDOBRONCHIAL ULTRASOUND N/A 12/21/2019   Procedure: ENDOBRONCHIAL ULTRASOUND;  Surgeon: Laurin Coder, MD;  Location: [facility];  Service: Pulmonary;  Laterality: N/A;  . FINE NEEDLE ASPIRATION  12/21/2019   Procedure: FINE NEEDLE ASPIRATION (FNA) LINEAR;  Surgeon: Laurin Coder, MD;  Location: [facility];  Service: Pulmonary;;  . NO PAST  SURGERIES    . VIDEO BRONCHOSCOPY N/A 12/21/2019   Procedure: VIDEO BRONCHOSCOPY WITHOUT FLUORO;  Surgeon: Laurin Coder, MD;  Location: [facility];  Service: Pulmonary;  Laterality: N/A;    Family History  Problem Relation Age of Onset  . Heart disease Mother        CABG  . Kidney disease Sister   . Diabetes Brother   . Kidney disease Brother        KIDNEY TRANSPLANT  . Hypertension Sister   . Healthy Daughter   . Healthy Daughter   . Healthy Son     Social History Social History   Tobacco Use  . Smoking status: Current Every Day Smoker    Packs/day: 0.50    Years: 48.00    Pack years: 24.00    Types: Cigarettes    Start date: 08/07/1970  . Smokeless tobacco: Never Used  . Tobacco comment: Trying to quit, smoking one a day right now.  Using nicotine patch  Vaping Use  . Vaping Use: Never used  Substance Use Topics  . Alcohol use: Yes  . Drug use: Never    Allergies  Allergen Reactions  . Penicillins Rash    Current Outpatient Medications  Medication Sig Dispense Refill  . chlorproMAZINE (THORAZINE) 25 MG tablet Take 1 tablet (25 mg total) by mouth 3 (three) times daily as needed. (Patient taking differently: Take 25 mg by mouth See admin instructions. Take 25 mg in the morning, may take a second 25 mg dose in the evening as needed for hiccups) 21 tablet 0  . dexamethasone (DECADRON) 4 MG tablet Take 1 tablet (4 mg total) by mouth 2 (two) times daily. 30 tablet 0  . levETIRAcetam (KEPPRA) 750 MG tablet Take 1 tablet (750 mg total) by mouth 2 (two) times daily. 180 tablet 4  . losartan-hydrochlorothiazide (HYZAAR) 50-12.5 MG tablet Take 1 tablet by mouth daily. 60 tablet 5  . nicotine (NICODERM CQ - DOSED IN MG/24 HOURS) 21 mg/24hr patch Place 21 mg onto the skin daily.    Marland Kitchen omeprazole (PRILOSEC) 20 MG capsule Take 1 capsule (20 mg total) by mouth daily. 30 capsule 2  . prochlorperazine (COMPAZINE) 10 MG tablet Take 1 tablet (10 mg total) by mouth every 6 (six)  hours as needed. 30 tablet 2   No current facility-administered medications for this visit.    REVIEW OF SYSTEMS:   Review of Systems  Constitutional: Positive for fatigue, generalized weakness, appetite change, and weight loss.  Negative for chills and fever. HENT: Negative for mouth sores, nosebleeds, sore throat and trouble swallowing.   Eyes: Positive for bilateral blurry vision.  Negative for  icterus.  Respiratory: Positive for mild shortness of breath.  Negative for cough, hemoptysis, and wheezing.   Cardiovascular: Negative for chest pain and leg swelling.  Gastrointestinal: Positive for nausea, heartburn, and vomiting.  Negative for abdominal pain, constipation, and diarrhea Genitourinary: Negative for bladder incontinence, difficulty urinating, dysuria, frequency and hematuria.   Musculoskeletal: Negative for back pain, gait problem, neck pain and neck stiffness.  Skin: Negative  for itching and rash.  Neurological: Negative for dizziness, extremity weakness, gait problem, headaches, light-headedness and seizures.  Hematological: Negative for adenopathy. Does not bruise/bleed easily.  Psychiatric/Behavioral: Positive for insomnia. Negative for confusion, and depression. The patient is not nervous/anxious.     PHYSICAL EXAMINATION:  Blood pressure 124/78, pulse 70, temperature (!) 96.5 F (35.8 C), temperature source Tympanic, resp. rate 18, height 6\' 1"  (1.854 m), weight 170 lb 3.2 oz (77.2 kg), SpO2 100 %.  ECOG PERFORMANCE STATUS: 1  Physical Exam  Constitutional: Oriented to person, place, and time and well-developed, well-nourished, and in no distress.  HENT:  Head: Normocephalic and atraumatic.  Mouth/Throat: Oropharynx is clear and moist. No oropharyngeal exudate.  Eyes: Conjunctivae are normal. Right eye exhibits no discharge. Left eye exhibits no discharge. No scleral icterus.  Neck: Normal range of motion. Neck supple.  Cardiovascular: Normal rate, regular rhythm,  normal heart sounds and intact distal pulses.   Pulmonary/Chest: Effort normal and breath sounds normal. No respiratory distress. No wheezes. No rales.  Abdominal: Soft. Bowel sounds are normal. Exhibits no distension and no mass. There is no tenderness.  Musculoskeletal: Normal range of motion. Exhibits no edema.  Lymphadenopathy:    No cervical adenopathy.  Neurological: Alert and oriented to person, place, and time. Exhibits normal muscle tone. Gait normal. Coordination normal.  Skin: Skin is warm and dry. No rash noted. Not diaphoretic. No erythema. No pallor.  Psychiatric: Mood, memory and judgment normal.  Vitals reviewed.  LABORATORY DATA: Lab Results  Component Value Date   WBC 16.8 (H) 01/04/2020   HGB 16.4 01/04/2020   HCT 46.8 01/04/2020   MCV 87.0 01/04/2020   PLT 444 (H) 01/04/2020      Chemistry      Component Value Date/Time   NA 131 (L) 01/04/2020 0856   NA 140 11/23/2019 1516   K 4.6 01/04/2020 0856   CL 96 (L) 01/04/2020 0856   CO2 24 01/04/2020 0856   BUN 33 (H) 01/04/2020 0856   BUN 14 11/23/2019 1516   CREATININE 1.08 01/04/2020 0856      Component Value Date/Time   CALCIUM 10.5 (H) 01/04/2020 0856   ALKPHOS 107 01/04/2020 0856   AST 21 01/04/2020 0856   ALT 46 (H) 01/04/2020 0856   BILITOT 1.5 (H) 01/04/2020 0856       RADIOGRAPHIC STUDIES: DG Chest 2 View  Result Date: 12/14/2019 CLINICAL DATA:  64 year old male with shortness of breath. Brain metastasis. History of smoking. EXAM: CHEST - 2 VIEW COMPARISON:  Chest radiograph dated 06/23/2018. FINDINGS: Background of emphysema. No focal consolidation, pleural effusion, or pneumothorax. The cardiac silhouette is within limits. No acute osseous pathology. IMPRESSION: No active cardiopulmonary disease. Electronically Signed   By: Anner Crete M.D.   On: 12/14/2019 19:53   CT HEAD WO CONTRAST  Result Date: 12/14/2019 CLINICAL DATA:  Seizure without known head injury. EXAM: CT HEAD WITHOUT CONTRAST  TECHNIQUE: Contiguous axial images were obtained from the base of the skull through the vertex without intravenous contrast. COMPARISON:  12/27/2019 FINDINGS: Brain: The brainstem and cerebellum are normal. Left cerebral hemispheres normal. In the right posterior frontal and frontoparietal junction region, there is a pronounced vasogenic edema pattern suggesting the presence of an underlying tumor. That is not specifically clearly defined by this noncontrast head CT. Cerebritis is less likely. MRI with and without contrast recommended. No evidence of hemorrhage, hydrocephalus or extra-axial collection. No midline shift. Vascular: There is atherosclerotic calcification of the major vessels at the  base of the brain. Skull: Negative Sinuses/Orbits: Clear/normal Other: None IMPRESSION: Prominent region of vasogenic edema in the right posterior frontal region and frontoparietal junction region worrisome for the presence of an occult underlying mass. Cerebritis is less likely. MRI with and without contrast recommended. Electronically Signed   By: Nelson Chimes M.D.   On: 12/14/2019 14:49   CT Chest W Contrast  Result Date: 12/14/2019 CLINICAL DATA:  64 year old male with brain mass/metastasis. Cancer of unknown primary. EXAM: CT CHEST, ABDOMEN, AND PELVIS WITH CONTRAST TECHNIQUE: Multidetector CT imaging of the chest, abdomen and pelvis was performed following the standard protocol during bolus administration of intravenous contrast. CONTRAST:  175mL OMNIPAQUE IOHEXOL 300 MG/ML  SOLN COMPARISON:  Chest radiograph dated 12/14/2019. FINDINGS: CT CHEST FINDINGS Cardiovascular: There is no cardiomegaly or pericardial effusion. Mild three-vessel coronary vascular calcification. The thoracic aorta is unremarkable. The origins of the great vessels of the aortic arch appear patent as visualized. There is a 12 mm hypodense focus in the inferior left hilum (34/3), likely mildly enlarged hilar lymph node. A pulmonary embolus is  less likely but not excluded. The pulmonary arteries are otherwise unremarkable as visualized. Mediastinum/Nodes: There is mediastinal adenopathy measuring up to 2 cm in short axis in the subcarinal region. Mildly enlarged lymph node in the aortopulmonic window measures 15 mm. The esophagus and the thyroid gland are grossly unremarkable. No mediastinal fluid collection. Lungs/Pleura: Minimal bibasilar subpleural atelectasis. There is a 4 mm left upper lobe nodule (51/4). No focal consolidation, pleural effusion, or pneumothorax. The central airways are patent. Musculoskeletal: Top-normal left supraclavicular lymph node measures 8 mm in short axis. No axillary adenopathy. No acute osseous pathology. CT ABDOMEN PELVIS FINDINGS No intra-abdominal free air or free fluid. Hepatobiliary: No focal liver abnormality is seen. No gallstones, gallbladder wall thickening, or biliary dilatation. Pancreas: Unremarkable. No pancreatic ductal dilatation or surrounding inflammatory changes. Spleen: Normal in size without focal abnormality. Adrenals/Urinary Tract: The adrenal glands unremarkable. There is no hydronephrosis on either side. There is symmetric enhancement and excretion of contrast by both kidneys. The visualized ureters and urinary bladder appear unremarkable. Stomach/Bowel: There is sigmoid diverticulosis without active inflammatory changes. There is no bowel obstruction or active inflammation. The appendix is normal. Vascular/Lymphatic: Moderate aortoiliac atherosclerotic disease. The IVC is unremarkable. No portal venous gas. Mildly enlarged peripancreatic lymph node measures 16 mm (coronal 60/7). Reproductive: The prostate and seminal vesicles are grossly unremarkable. Other: None Musculoskeletal: Degenerative changes of the spine. No acute osseous pathology. IMPRESSION: 1. Mediastinal and left hilar adenopathy of indeterminate etiology. Clinical correlation recommended. Bronchoscopy and tissue sampling may provide  better evaluation if clinically indicated. 2. A 4 mm left upper lobe pulmonary nodule. No follow-up needed if patient is low-risk. Non-contrast chest CT can be considered in 12 months if patient is high-risk. This recommendation follows the consensus statement: Guidelines for Management of Incidental Pulmonary Nodules Detected on CT Images: From the Fleischner Society 2017; Radiology 2017; 284:228-243. 3. Low attenuating focus inferior to the left hilum, likely a mildly enlarged lymph node. A pulmonary embolism is less likely. 4. Sigmoid diverticulosis. No bowel obstruction. Normal appendix. 5. Aortic Atherosclerosis (ICD10-I70.0). Electronically Signed   By: Anner Crete M.D.   On: 12/14/2019 21:40   MR Brain W Wo Contrast  Result Date: 12/31/2019 CLINICAL DATA:  64 year old male with solitary enhancing brain mass at the posterior superior frontal convexity discovered earlier this month. Mediastinal and left hilar lymphadenopathy - with bronchoscopy positive for non-small cell lung carcinoma. Treatment planning. EXAM:  MRI HEAD WITHOUT AND WITH CONTRAST TECHNIQUE: Multiplanar, multiecho pulse sequences of the brain and surrounding structures were obtained without and with intravenous contrast. CONTRAST:  27mL MULTIHANCE GADOBENATE DIMEGLUMINE 529 MG/ML IV SOLN COMPARISON:  Brain MRI 12/14/2019. FINDINGS: Brain: 12 mm heterogeneously enhancing round mass in the posterior right frontal lobe situated at the margin of the superior and middle frontal gyri is stable in size and configuration from earlier this month. Trace associated blood products. Regional vasogenic edema has mildly regressed. No significant mass effect now. No other brain metastasis identified. No dural thickening. Subtle and somewhat symmetric increased T1 signal in the bilateral internal auditory canals is felt to be artifact (series 11, image 55). Attention is directed on follow-up. No restricted diffusion to suggest acute infarction. No  midline shift ventriculomegaly, extra-axial collection or acute intracranial hemorrhage. Cervicomedullary junction and pituitary are within normal limits. Largely normal for age gray and white matter signal outside of the right frontal lobe. No cortical encephalomalacia. Vascular: Major intracranial vascular flow voids are stable. The major dural venous sinuses are enhancing and appear to be patent. Skull and upper cervical spine: Negative visible cervical spine and spinal cord. Visualized bone marrow signal is within normal limits. Sinuses/Orbits: Stable and negative. Other: Mastoids are clear. Normal noncontrast appearance of the internal auditory structures. Scalp and face appear negative. IMPRESSION: 1. Solitary posterior right frontal lobe brain metastasis, 12 mm and stable since 12/14/2019. Regional vasogenic edema has mildly regressed, with no significant mass effect now. 2. Artifactual increased T1 signal suspected in the internal auditory canals (more so the left). Attention is directed on follow-up. Electronically Signed   By: Genevie Ann M.D.   On: 12/31/2019 13:37   MR Brain W and Wo Contrast  Result Date: 12/14/2019 CLINICAL DATA:  Seizure. Abnormal CT head headache and blurred vision. EXAM: MRI HEAD WITHOUT AND WITH CONTRAST TECHNIQUE: Multiplanar, multiecho pulse sequences of the brain and surrounding structures were obtained without and with intravenous contrast. CONTRAST:  4mL GADAVIST GADOBUTROL 1 MMOL/ML IV SOLN COMPARISON:  CT head 12/14/2019 FINDINGS: Brain: Large area of edema in the right posterior frontal lobe. Within this edema there is a 13.5 mm enhancing mass lesion which is located peripherally. This shows fairly intense enhancement with small internal cystic changes. The mass is intra-axial but very close to the surface of the brain. No second lesion identified. No other edema Ventricle size normal.  No midline shift.  Negative for hemorrhage. Vascular: Normal arterial flow voids. Skull  and upper cervical spine: No focal skeletal lesion. Sinuses/Orbits: Paranasal sinuses clear.  Negative orbit Other: None IMPRESSION: 13.5 mm enhancing mass in the right posterior frontal cortex over the convexity. Large amount of surrounding vasogenic edema. This is most likely a solitary metastasis. Primary brain tumor a less likely consideration. Electronically Signed   By: Franchot Gallo M.D.   On: 12/14/2019 19:29   CT ABDOMEN PELVIS W CONTRAST  Result Date: 12/14/2019 CLINICAL DATA:  64 year old male with brain mass/metastasis. Cancer of unknown primary. EXAM: CT CHEST, ABDOMEN, AND PELVIS WITH CONTRAST TECHNIQUE: Multidetector CT imaging of the chest, abdomen and pelvis was performed following the standard protocol during bolus administration of intravenous contrast. CONTRAST:  17mL OMNIPAQUE IOHEXOL 300 MG/ML  SOLN COMPARISON:  Chest radiograph dated 12/14/2019. FINDINGS: CT CHEST FINDINGS Cardiovascular: There is no cardiomegaly or pericardial effusion. Mild three-vessel coronary vascular calcification. The thoracic aorta is unremarkable. The origins of the great vessels of the aortic arch appear patent as visualized. There is a 12  mm hypodense focus in the inferior left hilum (34/3), likely mildly enlarged hilar lymph node. A pulmonary embolus is less likely but not excluded. The pulmonary arteries are otherwise unremarkable as visualized. Mediastinum/Nodes: There is mediastinal adenopathy measuring up to 2 cm in short axis in the subcarinal region. Mildly enlarged lymph node in the aortopulmonic window measures 15 mm. The esophagus and the thyroid gland are grossly unremarkable. No mediastinal fluid collection. Lungs/Pleura: Minimal bibasilar subpleural atelectasis. There is a 4 mm left upper lobe nodule (51/4). No focal consolidation, pleural effusion, or pneumothorax. The central airways are patent. Musculoskeletal: Top-normal left supraclavicular lymph node measures 8 mm in short axis. No axillary  adenopathy. No acute osseous pathology. CT ABDOMEN PELVIS FINDINGS No intra-abdominal free air or free fluid. Hepatobiliary: No focal liver abnormality is seen. No gallstones, gallbladder wall thickening, or biliary dilatation. Pancreas: Unremarkable. No pancreatic ductal dilatation or surrounding inflammatory changes. Spleen: Normal in size without focal abnormality. Adrenals/Urinary Tract: The adrenal glands unremarkable. There is no hydronephrosis on either side. There is symmetric enhancement and excretion of contrast by both kidneys. The visualized ureters and urinary bladder appear unremarkable. Stomach/Bowel: There is sigmoid diverticulosis without active inflammatory changes. There is no bowel obstruction or active inflammation. The appendix is normal. Vascular/Lymphatic: Moderate aortoiliac atherosclerotic disease. The IVC is unremarkable. No portal venous gas. Mildly enlarged peripancreatic lymph node measures 16 mm (coronal 60/7). Reproductive: The prostate and seminal vesicles are grossly unremarkable. Other: None Musculoskeletal: Degenerative changes of the spine. No acute osseous pathology. IMPRESSION: 1. Mediastinal and left hilar adenopathy of indeterminate etiology. Clinical correlation recommended. Bronchoscopy and tissue sampling may provide better evaluation if clinically indicated. 2. A 4 mm left upper lobe pulmonary nodule. No follow-up needed if patient is low-risk. Non-contrast chest CT can be considered in 12 months if patient is high-risk. This recommendation follows the consensus statement: Guidelines for Management of Incidental Pulmonary Nodules Detected on CT Images: From the Fleischner Society 2017; Radiology 2017; 284:228-243. 3. Low attenuating focus inferior to the left hilum, likely a mildly enlarged lymph node. A pulmonary embolism is less likely. 4. Sigmoid diverticulosis. No bowel obstruction. Normal appendix. 5. Aortic Atherosclerosis (ICD10-I70.0). Electronically Signed   By:  Anner Crete M.D.   On: 12/14/2019 21:40   NM PET Image Initial (PI) Skull Base To Thigh  Result Date: 01/01/2020 CLINICAL DATA:  Initial treatment strategy for non-small cell lung cancer. Solitary brain metastasis. EXAM: NUCLEAR MEDICINE PET SKULL BASE TO THIGH TECHNIQUE: 10.2 mCi F-18 FDG was injected intravenously. Full-ring PET imaging was performed from the skull base to thigh after the radiotracer. CT data was obtained and used for attenuation correction and anatomic localization. Fasting blood glucose: 326 mg/dl COMPARISON:  Chest CT 12/14/2019 FINDINGS: Mediastinal blood pool activity: SUV max 2.63 Liver activity: SUV max 3.34 NECK: Hypermetabolic nodule in the posterior aspect of the RIGHT lobe of thyroid gland with SUV max equal 4.9. This nodule measures 8 mm on image 52/4). Incidental CT findings: none CHEST: Hypermetabolic subcarinal lymph node with SUV max equal 4.8. Hypermetabolic lymph node posterior to the LEFT mainstem bronchus and extending into the LEFT hilum with SUV max equal 7.0. This node measures approximately 18 mm on image 81/4. There are no hypermetabolic pulmonary nodules. Small 4 mm nodule in the LEFT upper lobe (image 68/4) is no associated metabolic activity. Incidental CT findings: none ABDOMEN/PELVIS: No abnormal hypermetabolic activity within the liver, pancreas, adrenal glands, or spleen. No hypermetabolic lymph nodes in the abdomen or pelvis. Incidental CT  findings: Atherosclerotic calcification of the aorta. Prostate normal SKELETON: No focal hypermetabolic activity to suggest skeletal metastasis. Incidental CT findings: none IMPRESSION: 1. Hypermetabolic subcarinal and LEFT hilar adenopathy consistent metastatic adenopathy. 2. No hypermetabolic pulmonary nodules. 3. Hypermetabolic nodule in the RIGHT lobe of the thyroid gland. This is favored incidental finding representing adenoma however cannot exclude thyroid carcinoma or rare thyroid cancer metastasis. Consider  thyroid ultrasound and tissue sampling as clinically warranted. 4. No definitive primary lesion identified. Electronically Signed   By: Suzy Bouchard M.D.   On: 01/01/2020 16:37   DG CHEST PORT 1 VIEW  Result Date: 12/21/2019 CLINICAL DATA:  Post bronchoscopy. EXAM: PORTABLE CHEST 1 VIEW COMPARISON:  12/14/2019 FINDINGS: Lungs are adequately inflated without consolidation or effusion. Possible minimal linear atelectasis left base. Cardiomediastinal silhouette and remainder the exam is unchanged. IMPRESSION: No active disease. Electronically Signed   By: Marin Olp M.D.   On: 12/21/2019 09:46    ASSESSMENT: This is a very pleasant 64 year old African-American male recently diagnosed with stage IV non-small cell carcinoma.  He presented with a solitary brain metastasis and subcarinal and left hilar adenopathy.  He was diagnosed in August 2021.   PLAN: The patient was seen with Dr. Julien Nordmann today.  Dr. Julien Nordmann reviewed the PET scan results with the patient and his sister.  Considering the locally advanced disease in the chest, Dr. Julien Nordmann recommends starting the patient on concurrent chemoradiation with carboplatin for an AUC of 2 and paclitaxel 45 mg per metered squared.  The patient is interested in this option.  The patient is receiving SRS to the solitary brain metastasis tomorrow.  He is also scheduled for a craniotomy on 01/07/2020.   We will not schedule his chemotherapy to start at this point in time.  We will see him back for follow-up visit in 2 weeks for evaluation and to review the plan in more detail.  At that time, we will arrange for the patient to have a chemo education class and arrange for his first chemotherapy based on how he is recovering from his treatment for his brain metastasis.  In the meantime, I have sent a prescription to the patient's pharmacy for Compazine every 6 hours as needed for nausea and vomiting.  I have also sent a prescription for omeprazole 20 mg p.o. daily.   I discussed with the patient that this works the best if taken daily. I also sent in a prescription for remeron 15 mg p.o. nightly to help with his appetite and insomnia.   The patient's PET scan noted a hypermetabolic nodule in the right lobe of the thyroid gland.  I will arrange for thyroid ultrasound to further evaluate this lesion.  If this lesion meets criteria for biopsy, I will arrange for a core biopsy.  The patient has been losing a significant amount of weight.  Dr. Julien Nordmann recommends that he continue to take Decadron for his brain metastasis which may also help his appetite.  I will also place a referral to the nutritionist to evaluate the patient.  He was also encouraged to use low sugar supplemental drinks such as Ensure, boost, or Carnation breakfast essentials.  The patient's blood sugar was elevated today at 370.  We will reach out to his family doctor to get him an appointment sooner for management of his hyperglycemia.  Per chart review, it is appears that he has had hyperglycemia performed on routine labs.  We will administer 10 units of insulin at his appointment today.  The  patient voices understanding of current disease status and treatment options and is in agreement with the current care plan.  All questions were answered. The patient knows to call the clinic with any problems, questions or concerns. We can certainly see the patient much sooner if necessary.  Thank you so much for allowing me to participate in the care of Jared Shea.. I will continue to follow up the patient with you and assist in his care.  The total time spent in the appointment was 70 minutes.  Disclaimer: This note was dictated with voice recognition software. Similar sounding words can inadvertently be transcribed and may not be corrected upon review.  Jarely Juncaj L Shivaun Bilello January 04, 2020, 11:16 AM   ADDENDUM: Hematology/Oncology Attending: I had a face-to-face encounter with the patient  today.  I recommended his care plan.  This is a very pleasant 64 years old African-American male with past medical history significant for hypertension and long history of smoking.  The patient mentions that he presented to the emergency department on 12/14/2019 complaining of seizure and headache that has been going on for around 3 months.  During his evaluation he had CT scan of the head followed by MRI of the brain that showed right frontal lobe lesion with vasogenic edema.  The patient is started on IV Decadron as well as Keppra for the seizure activity. During his evaluation he had CT scan of the chest, abdomen and pelvis that showed mediastinal and left hilar adenopathy of indeterminate etiology.  There was a 4 mm left upper lobe pulmonary nodule.  The patient was seen by Dr. Ander Slade and he underwent bronchoscopy with fine-needle aspiration of the subcarinal lymph node.  The final pathology ([id number]) showed malignant cells consistent with metastatic non-small cell carcinoma. A PET scan on 01/01/2020 showed hypermetabolic subcarinal and left hilar adenopathy consistent with metastatic adenopathy.  No hypermetabolic pulmonary nodules and there was hypermetabolic nodule in the right lobe of the thyroid gland likely adenoma and follow-up with ultrasound was recommended. The patient was seen by neurosurgery and radiation oncology.  He is scheduled for SRS followed by craniotomy on 01/07/2020. The patient was referred to me today for evaluation and recommendation regarding treatment of his condition. When seen today he continues to complain of fatigue and lack of appetite as well as weight loss.  He has no chest pain or shortness of breath. This is a 64 years old African-American male recently diagnosed with a stage IV (TX, N2, M1 C) non-small cell lung cancer with unknown subtype diagnosed in August 2021 and presented with solitary brain metastasis in addition to mediastinal lymphadenopathy. I had a  lengthy discussion with the patient and his sister today about his current condition and treatment options. I recommended for the patient to proceed with the [facility] and craniotomy as planned by neurosurgery and radiation oncology.  Following the procedure, the patient may benefit from a course of concurrent chemoradiation to the locally advanced disease in the chest. The patient may also be candidate for systemic chemotherapy for the stage IV disease but we would reserve this if he has any evidence for disease progression outside the currently known areas of disease. I will arrange for the patient to come back for follow-up visit in 2 weeks for further evaluation and more discussion of his treatment options after management of his solitary brain metastasis. For the lack of appetite and depression, we will start the patient on Remeron. The patient was advised to call immediately if he  has any other concerning symptoms in the interval.  Disclaimer: This note was dictated with voice recognition software. Similar sounding words can inadvertently be transcribed and may be missed upon review. Eilleen Kempf, MD 01/04/20 .

## 2020-01-04 ENCOUNTER — Encounter (HOSPITAL_COMMUNITY)
Admission: RE | Admit: 2020-01-04 | Discharge: 2020-01-04 | Disposition: A | Discharge status: Home / Self Care | Payer: Medicare Other | Source: Ambulatory Visit | Attending: Neurosurgery | Admitting: Neurosurgery

## 2020-01-04 ENCOUNTER — Telehealth: Payer: Self-pay | Admitting: Radiation Oncology

## 2020-01-04 ENCOUNTER — Other Ambulatory Visit: Payer: Self-pay

## 2020-01-04 ENCOUNTER — Encounter: Payer: Self-pay | Admitting: Physician Assistant

## 2020-01-04 ENCOUNTER — Other Ambulatory Visit (HOSPITAL_COMMUNITY)
Admission: RE | Admit: 2020-01-04 | Discharge: 2020-01-04 | Disposition: A | Discharge status: Home / Self Care | Payer: Medicare Other | Source: Ambulatory Visit | Attending: Neurosurgery | Admitting: Neurosurgery

## 2020-01-04 ENCOUNTER — Inpatient Hospital Stay (HOSPITAL_BASED_OUTPATIENT_CLINIC_OR_DEPARTMENT_OTHER): Payer: Medicare Other | Admitting: Physician Assistant

## 2020-01-04 ENCOUNTER — Other Ambulatory Visit: Payer: Self-pay | Admitting: Physician Assistant

## 2020-01-04 ENCOUNTER — Inpatient Hospital Stay: Payer: Medicare Other

## 2020-01-04 ENCOUNTER — Encounter (HOSPITAL_COMMUNITY): Payer: Self-pay

## 2020-01-04 VITALS — BP 124/78 | HR 70 | Temp 96.5°F | Resp 18 | Ht 73.0 in | Wt 170.2 lb

## 2020-01-04 DIAGNOSIS — I700 Atherosclerosis of aorta: Secondary | ICD-10-CM | POA: Insufficient documentation

## 2020-01-04 DIAGNOSIS — E041 Nontoxic single thyroid nodule: Secondary | ICD-10-CM | POA: Insufficient documentation

## 2020-01-04 DIAGNOSIS — R531 Weakness: Secondary | ICD-10-CM | POA: Insufficient documentation

## 2020-01-04 DIAGNOSIS — Z7189 Other specified counseling: Secondary | ICD-10-CM | POA: Diagnosis not present

## 2020-01-04 DIAGNOSIS — Z7901 Long term (current) use of anticoagulants: Secondary | ICD-10-CM | POA: Insufficient documentation

## 2020-01-04 DIAGNOSIS — R590 Localized enlarged lymph nodes: Secondary | ICD-10-CM | POA: Insufficient documentation

## 2020-01-04 DIAGNOSIS — R569 Unspecified convulsions: Secondary | ICD-10-CM | POA: Insufficient documentation

## 2020-01-04 DIAGNOSIS — K5730 Diverticulosis of large intestine without perforation or abscess without bleeding: Secondary | ICD-10-CM | POA: Insufficient documentation

## 2020-01-04 DIAGNOSIS — R1110 Vomiting, unspecified: Secondary | ICD-10-CM | POA: Insufficient documentation

## 2020-01-04 DIAGNOSIS — I10 Essential (primary) hypertension: Secondary | ICD-10-CM | POA: Insufficient documentation

## 2020-01-04 DIAGNOSIS — E1165 Type 2 diabetes mellitus with hyperglycemia: Secondary | ICD-10-CM | POA: Insufficient documentation

## 2020-01-04 DIAGNOSIS — Z803 Family history of malignant neoplasm of breast: Secondary | ICD-10-CM | POA: Insufficient documentation

## 2020-01-04 DIAGNOSIS — F17210 Nicotine dependence, cigarettes, uncomplicated: Secondary | ICD-10-CM | POA: Insufficient documentation

## 2020-01-04 DIAGNOSIS — R911 Solitary pulmonary nodule: Secondary | ICD-10-CM

## 2020-01-04 DIAGNOSIS — G4700 Insomnia, unspecified: Secondary | ICD-10-CM | POA: Insufficient documentation

## 2020-01-04 DIAGNOSIS — R739 Hyperglycemia, unspecified: Secondary | ICD-10-CM | POA: Insufficient documentation

## 2020-01-04 DIAGNOSIS — T380X5A Adverse effect of glucocorticoids and synthetic analogues, initial encounter: Secondary | ICD-10-CM | POA: Insufficient documentation

## 2020-01-04 DIAGNOSIS — R519 Headache, unspecified: Secondary | ICD-10-CM | POA: Insufficient documentation

## 2020-01-04 DIAGNOSIS — R634 Abnormal weight loss: Secondary | ICD-10-CM | POA: Insufficient documentation

## 2020-01-04 DIAGNOSIS — R066 Hiccough: Secondary | ICD-10-CM | POA: Insufficient documentation

## 2020-01-04 DIAGNOSIS — C7951 Secondary malignant neoplasm of bone: Secondary | ICD-10-CM | POA: Insufficient documentation

## 2020-01-04 DIAGNOSIS — I351 Nonrheumatic aortic (valve) insufficiency: Secondary | ICD-10-CM | POA: Insufficient documentation

## 2020-01-04 DIAGNOSIS — Z79899 Other long term (current) drug therapy: Secondary | ICD-10-CM | POA: Insufficient documentation

## 2020-01-04 DIAGNOSIS — C3490 Malignant neoplasm of unspecified part of unspecified bronchus or lung: Secondary | ICD-10-CM

## 2020-01-04 DIAGNOSIS — F329 Major depressive disorder, single episode, unspecified: Secondary | ICD-10-CM | POA: Insufficient documentation

## 2020-01-04 DIAGNOSIS — J439 Emphysema, unspecified: Secondary | ICD-10-CM | POA: Insufficient documentation

## 2020-01-04 DIAGNOSIS — Z01812 Encounter for preprocedural laboratory examination: Secondary | ICD-10-CM | POA: Insufficient documentation

## 2020-01-04 DIAGNOSIS — Z20822 Contact with and (suspected) exposure to covid-19: Secondary | ICD-10-CM | POA: Insufficient documentation

## 2020-01-04 DIAGNOSIS — C7931 Secondary malignant neoplasm of brain: Secondary | ICD-10-CM | POA: Insufficient documentation

## 2020-01-04 DIAGNOSIS — C801 Malignant (primary) neoplasm, unspecified: Secondary | ICD-10-CM | POA: Insufficient documentation

## 2020-01-04 DIAGNOSIS — R630 Anorexia: Secondary | ICD-10-CM | POA: Insufficient documentation

## 2020-01-04 HISTORY — DX: Type 2 diabetes mellitus without complications: E11.9

## 2020-01-04 HISTORY — DX: Nontoxic single thyroid nodule: E04.1

## 2020-01-04 LAB — CMP (CANCER CENTER ONLY)
ALT: 46 U/L — ABNORMAL HIGH (ref 0–44)
AST: 21 U/L (ref 15–41)
Albumin: 4 g/dL (ref 3.5–5.0)
Alkaline Phosphatase: 107 U/L (ref 38–126)
Anion gap: 11 (ref 5–15)
BUN: 33 mg/dL — ABNORMAL HIGH (ref 8–23)
CO2: 24 mmol/L (ref 22–32)
Calcium: 10.5 mg/dL — ABNORMAL HIGH (ref 8.9–10.3)
Chloride: 96 mmol/L — ABNORMAL LOW (ref 98–111)
Creatinine: 1.08 mg/dL (ref 0.61–1.24)
GFR, Est AFR Am: >60 mL/min (ref >60)
GFR, Estimated: >60 mL/min (ref >60)
Glucose, Bld: 370 mg/dL — ABNORMAL HIGH (ref 70–99)
Potassium: 4.6 mmol/L (ref 3.5–5.1)
Sodium: 131 mmol/L — ABNORMAL LOW (ref 135–145)
Total Bilirubin: 1.5 mg/dL — ABNORMAL HIGH (ref 0.3–1.2)
Total Protein: 8.4 g/dL — ABNORMAL HIGH (ref 6.5–8.1)

## 2020-01-04 LAB — CBC WITH DIFFERENTIAL (CANCER CENTER ONLY)
Abs Immature Granulocytes: 0.12 10*3/uL — ABNORMAL HIGH (ref 0.00–0.07)
Basophils Absolute: 0 10*3/uL (ref 0.0–0.1)
Basophils Relative: 0 %
Eosinophils Absolute: 0 10*3/uL (ref 0.0–0.5)
Eosinophils Relative: 0 %
HCT: 46.8 % (ref 39.0–52.0)
Hemoglobin: 16.4 g/dL (ref 13.0–17.0)
Immature Granulocytes: 1 %
Lymphocytes Relative: 20 %
Lymphs Abs: 3.3 10*3/uL (ref 0.7–4.0)
MCH: 30.5 pg (ref 26.0–34.0)
MCHC: 35 g/dL (ref 30.0–36.0)
MCV: 87 fL (ref 80.0–100.0)
Monocytes Absolute: 1 10*3/uL (ref 0.1–1.0)
Monocytes Relative: 6 %
Neutro Abs: 12.3 10*3/uL — ABNORMAL HIGH (ref 1.7–7.7)
Neutrophils Relative %: 73 %
Platelet Count: 444 10*3/uL — ABNORMAL HIGH (ref 150–400)
RBC: 5.38 MIL/uL (ref 4.22–5.81)
RDW: 13.6 % (ref 11.5–15.5)
WBC Count: 16.8 10*3/uL — ABNORMAL HIGH (ref 4.0–10.5)
nRBC: 0 % (ref 0.0–0.2)

## 2020-01-04 LAB — GLUCOSE, CAPILLARY: Glucose-Capillary: 286 mg/dL — ABNORMAL HIGH (ref 70–99)

## 2020-01-04 LAB — TYPE AND SCREEN
ABO/RH(D): O POS
Antibody Screen: NEGATIVE

## 2020-01-04 LAB — SARS CORONAVIRUS 2 (TAT 6-24 HRS): SARS Coronavirus 2: NEGATIVE

## 2020-01-04 MED ORDER — OMEPRAZOLE 20 MG PO CPDR: 20.0000 mg | DELAYED_RELEASE_CAPSULE | Freq: Every day | ORAL | 2 refills | Status: DC

## 2020-01-04 MED ORDER — INSULIN REGULAR HUMAN 100 UNIT/ML IJ SOLN
INTRAMUSCULAR | Status: AC
  Filled 2020-01-04: qty 1

## 2020-01-04 MED ORDER — PROCHLORPERAZINE MALEATE 10 MG PO TABS: 10.0000 mg | ORAL_TABLET | Freq: Four times a day (QID) | ORAL | 2 refills | Status: DC | PRN

## 2020-01-04 MED ORDER — MIRTAZAPINE 15 MG PO TABS: 15.0000 mg | ORAL_TABLET | Freq: Every day | ORAL | 2 refills | Status: DC

## 2020-01-04 MED ORDER — INSULIN REGULAR HUMAN 100 UNIT/ML IJ SOLN
10.0000 [IU] | Freq: Once | INTRAMUSCULAR | Status: AC
  Administered 2020-01-04: 10 [IU] via SUBCUTANEOUS

## 2020-01-04 NOTE — Progress Notes (Addendum)
Anesthesia Chart Review:  Case: [id number] Date/Time: 01/07/20 0745   Procedures:      RIGHT CRANIOTOMY FOR TUMOR RESECTION (Right ) - rm 21     APPLICATION OF CRANIAL NAVIGATION (N/A )   Anesthesia type: General   Pre-op diagnosis: BRAIN TUMOR   Location: [facility] OR ROOM 21 / [place] OR   Surgeons: Ashok Pall, MD      DISCUSSION: Patient is a 64 year old male scheduled for the above procedure. His birthday is [birth year], so will be 64 years old. Recently admitted 12/14/19-12/15/19 for focal seziure with MRI showing right frontal brain mass, likely metastatic from unknown site. He was started on Decadron and Keppra with out patient neurology, neurosurgery, pulmonology follow-up. 12/21/19 bronchoscopy showed: Malignant cells consistent with metastatic non-small cell carcinoma. He has now been seen by MED-ONC and RAD-ONC.  Other history includes smoking, HTN, chronic cough, chest pain (08/12/18, low risk stress test 10/20/18), brain mass, seizures (related to brain mets). He was recently prescribed thorazine 25 mg TID as needed for 7 days by pulmonology after developing hiccups following bronchoscopy.    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 He was reporting cramp like symptoms LUE that resolved within 30 minutes. Per PAT RN, no seizure like activity or mental status changes. He did have hiccups, which has apparently been chronic since his bronchoscopy and has been prescribed thorazine. He had labs earlier today (01/04/20) per oncology showing Calcium 10.5--no hypocalcemia noted that might explain possible Trousseau sign. Albumin normal. Creatinine 1.08. H/H WNL. WBC 16.8 and glucose 370 in setting of steroids (without known prior history of DM). I notified Janett Billow at Dr. Lacy Duverney office of these happening and also that noted to be hyperglycemia and has appointment with IM on 01/05/20. A1c was ordered but is in process.     He has an appointment scheduled at [facility] on 01/05/20 at 9:45 am. He and daughter were notified by Shona Simpson, PA-C on 01/04/20 to follow-up there for hyperglycemia. He was given 10 units of insulin at 01/04/20 visit with oncology, Heilingoetter, Cassandra, PA-C. He was also prescribed Compazine as needed for N/V and omeprazole and Remeron to help with appetite and some insomnia. PET scan showed a thyroid nodule that meets criteria for biopsy, so that is being arranged as well.   UPDATE 01/05/20 6:00 PM: 01/04/20 COVID-19 test negative. A1c resulted at 7.6%. See by Jose Persia, MD and started on metformin. DM added to history. He was also referred for counseling due to anxiety. 4 week primary care follow-up recommended. Had [facility] treatment today. He is scheduled to see pulmonology on 01/06/20. Anesthesia team to evaluate on the day of surgery.     VS: BP 137/79   Pulse 62   Temp 36.4 C (Oral)   Resp 18   Ht 6\' 1"  (1.854 m)   Wt 77.4 kg   SpO2 100%   BMI 22.51 kg/m    PROVIDERS: Jean Rosenthal, MD is listed as PCP - Marcial Pacas, MD is neurologist. Visit 12/23/19 following focal seizure 11/2019 with imaging showing right frontal brain lesion, likely metastatic, unknown primary. Bronchoscopy revealed non-small cell carcinoma. Seizure felt related to metastatic brain lesion. On Decadron and Keppra.  Kyung Rudd, MD is RAD-ONC. Plan for [place] treatment 01/05/20 then surgical resection of brain lesion 01/06/20. Consideration for chemoradiation afterwards.  Curt Bears, MD is HEM-ONC. Seen by Heilingoetter, Cassandra, PA-C  on 01/04/20.   Silas Flood, Matthew, MD will be pulmonologist. Visit schedueld for 01/06/20.  - He was evaluated by cardiologist Fransico Him, MD on 08/12/18 for chest pain, thought more likely related to poorly controlled BP. Stress and echo were ordered. Stress test was non-ischemic. LVEF normal, trivial AR by echo.    LABS: As of 01/04/20, labs include: Lab Results  Component Value Date    WBC 16.8 (H) 01/04/2020   HGB 16.4 01/04/2020   HCT 46.8 01/04/2020   PLT 444 (H) 01/04/2020   GLUCOSE 370 (H) 01/04/2020   ALT 46 (H) 01/04/2020   AST 21 01/04/2020   NA 131 (L) 01/04/2020   K 4.6 01/04/2020   CL 96 (L) 01/04/2020   CREATININE 1.08 01/04/2020   BUN 33 (H) 01/04/2020   CO2 24 01/04/2020   INR 1.1 12/14/2019   HGBA1C 7.6 (H) 01/04/2020     IMAGES: PET Scan 01/01/20: IMPRESSION: 1. Hypermetabolic subcarinal and LEFT hilar adenopathy consistent metastatic adenopathy. 2. No hypermetabolic pulmonary nodules. 3. Hypermetabolic nodule in the RIGHT lobe of the thyroid gland. This is favored incidental finding representing adenoma however cannot exclude thyroid carcinoma or rare thyroid cancer metastasis. Consider thyroid ultrasound and tissue sampling as clinically warranted. 4. No definitive primary lesion identified.  MRI Brain 12/31/19: IMPRESSION: 1. Solitary posterior right frontal lobe brain metastasis, 12 mm and stable since 12/14/2019. Regional vasogenic edema has mildly regressed, with no significant mass effect now. 2. Artifactual increased T1 signal suspected in the internal auditory canals (more so the left). Attention is directed on follow-up.  1V PCXR (post-bronchoscopy) 12/21/19: FINDINGS: Lungs are adequately inflated without consolidation or effusion. Possible minimal linear atelectasis left base. Cardiomediastinal silhouette and remainder the exam is unchanged. IMPRESSION: No active disease.    EKG: 12/14/19: Sinus rhythm Anteroseptal infarct, age indeterminate No significant change since last tracing Confirmed by Isla Pence [phone]) on 12/14/2019 5:21:57 PM   CV: Nuclear stress test 10/20/18:  Nuclear stress EF: 54%.  The study is normal. Low risk stress nuclear study with normal perfusion and normal left ventricular regional and global systolic function.   Echo 10/20/18: IMPRESSIONS  1. The left ventricle has normal systolic  function with an ejection  fraction of 60-65%. The cavity size was normal. There is mildly increased  left ventricular wall thickness. Left ventricular diastolic parameters  were normal. No evidence of left  ventricular regional wall motion abnormalities.  2. The right ventricle has normal systolic function. The cavity was  normal. There is no increase in right ventricular wall thickness.  3. The aortic valve is tricuspid. Mild sclerosis of the aortic valve.  Aortic valve regurgitation is trivial by color flow Doppler.    Past Medical History:  Diagnosis Date  . Brain mass   . Bursitis of right hip   . Chest pain 08/12/2018  . Chronic cough   . Dependence on nicotine from cigarettes   . Essential hypertension 07/28/2018  . Seizures ([place])   . Viral illness 03/23/2019    Past Surgical History:  Procedure Laterality Date  . ENDOBRONCHIAL ULTRASOUND N/A 12/21/2019   Procedure: ENDOBRONCHIAL ULTRASOUND;  Surgeon: Laurin Coder, MD;  Location: [facility] ENDOSCOPY;  Service: Pulmonary;  Laterality: N/A;  . FINE NEEDLE ASPIRATION  12/21/2019   Procedure: FINE NEEDLE ASPIRATION (FNA) LINEAR;  Surgeon: Laurin Coder, MD;  Location: [facility] ENDOSCOPY;  Service: Pulmonary;;  . NO PAST SURGERIES    . VIDEO BRONCHOSCOPY N/A 12/21/2019   Procedure: VIDEO BRONCHOSCOPY WITHOUT FLUORO;  Surgeon: Laurin Coder, MD;  Location: [facility];  Service: Pulmonary;  Laterality: N/A;    MEDICATIONS: . chlorproMAZINE (THORAZINE) 25 MG tablet  . dexamethasone (DECADRON) 4 MG tablet  . levETIRAcetam (KEPPRA) 750 MG tablet  . losartan-hydrochlorothiazide (HYZAAR) 50-12.5 MG tablet  . mirtazapine (REMERON) 15 MG tablet  . nicotine (NICODERM CQ - DOSED IN MG/24 HOURS) 21 mg/24hr patch  . omeprazole (PRILOSEC) 20 MG capsule  . prochlorperazine (COMPAZINE) 10 MG tablet   No current facility-administered medications for this encounter.    Myra Gianotti, PA-C Surgical Short Stay/Anesthesiology [facility] Phone  [phone] [facility] Phone [phone] 01/04/2020 4:37 PM

## 2020-01-04 NOTE — Patient Instructions (Addendum)
-There are two main categories of lung cancer, they are named based on the size of the cancer cell. One is called Non-Small cell lung cancer. The other type is Small Cell Lung Cancer -The sample (biopsy) that they took of your tumor was consistent with a subtype of Non-small cell lung cancer. -We covered a lot of important information at your appointment today regarding what the treatment plan is moving forward. Here are the the main points that were discussed at your office visit with [place] today:  -The treatment that you will receive consists of two chemotherapy drugs called Carboplatin and Paclitaxel (also called Taxol) -We are planning on starting your treatment week on 01/19/20 but before your start your treatment, I would like you to attend a Chemotherapy Education Class. This involves having you sit down with one of our nurse educators. She will discuss with you one-on-one more details about your treatment as well as general information about resources here at the [place] treatment will be given every week for about 6 weeks or so (as long as you are also receiving radiation). We will check your labs (blood work) once a week . Dr. Julien Nordmann or I will see you every other treatment just to make sure that you are doing well and that everything is on track. -We will get a CT scan about 3 weeks after you complete your treatment  Medications:  -Compazine was sent to your pharmacy. This medication is for nausea. You may take this every 6 hours as needed if you feel nausous.  -Please pick up omeprazole to help with the acid reflux. I sent a prescription  Referrals -There was some abnormal activity in the thyroid which is a gland in the neck. I will arrange for you to have them look at this with an ultrasound and if it is suspicious they will biopsy it (take a sample) -I will place a referral to the dietitian. She may call you. Otherwise, I would purchase some of those products such as boost,  ensure, carnation breakfast essentials, etc.   Follow Up:  -We will see you back for a follow up visit in 2 weeks -Please make sure you see your family doctor to consider starting you on medication for your blood sugar.   Steps to Quit Smoking Smoking tobacco is the leading cause of preventable death. It can affect almost every organ in the body. Smoking puts you and people around you at risk for many serious, long-lasting (chronic) diseases. Quitting smoking can be hard, but it is one of the best things that you can do for your health. It is never too late to quit. How do I get ready to quit? When you decide to quit smoking, make a plan to help you succeed. Before you quit:  Pick a date to quit. Set a date within the next 2 weeks to give you time to prepare.  Write down the reasons why you are quitting. Keep this list in places where you will see it often.  Tell your family, friends, and co-workers that you are quitting. Their support is important.  Talk with your doctor about the choices that may help you quit.  Find out if your health insurance will pay for these treatments.  Know the people, places, things, and activities that make you want to smoke (triggers). Avoid them. What first steps can I take to quit smoking?  Throw away all cigarettes at home, at work, and in your car.  Throw  away the things that you use when you smoke, such as ashtrays and lighters.  Clean your car. Make sure to empty the ashtray.  Clean your home, including curtains and carpets. What can I do to help me quit smoking? Talk with your doctor about taking medicines and seeing a counselor at the same time. You are more likely to succeed when you do both.  If you are pregnant or breastfeeding, talk with your doctor about counseling or other ways to quit smoking. Do not take medicine to help you quit smoking unless your doctor tells you to do so. To quit smoking: Quit right away  Quit smoking totally,  instead of slowly cutting back on how much you smoke over a period of time.  Go to counseling. You are more likely to quit if you go to counseling sessions regularly. Take medicine You may take medicines to help you quit. Some medicines need a prescription, and some you can buy over-the-counter. Some medicines may contain a drug called nicotine to replace the nicotine in cigarettes. Medicines may:  Help you to stop having the desire to smoke (cravings).  Help to stop the problems that come when you stop smoking (withdrawal symptoms). Your doctor may ask you to use:  Nicotine patches, gum, or lozenges.  Nicotine inhalers or sprays.  Non-nicotine medicine that is taken by mouth. Find resources Find resources and other ways to help you quit smoking and remain smoke-free after you quit. These resources are most helpful when you use them often. They include:  Online chats with a Social worker.  Phone quitlines.  Printed Furniture conservator/restorer.  Support groups or group counseling.  Text messaging programs.  Mobile phone apps. Use apps on your mobile phone or tablet that can help you stick to your quit plan. There are many free apps for mobile phones and tablets as well as websites. Examples include Quit Guide from the [employer] and smokefree.gov  What things can I do to make it easier to quit?   Talk to your family and friends. Ask them to support and encourage you.  Call a phone quitline (1-800-QUIT-NOW), reach out to support groups, or work with a Social worker.  Ask people who smoke to not smoke around you.  Avoid places that make you want to smoke, such as: ? Bars. ? Parties. ? Smoke-break areas at work.  Spend time with people who do not smoke.  Lower the stress in your life. Stress can make you want to smoke. Try these things to help your stress: ? Getting regular exercise. ? Doing deep-breathing exercises. ? Doing yoga. ? Meditating. ? Doing a body scan. To do this, close your  eyes, focus on one area of your body at a time from head to toe. Notice which parts of your body are tense. Try to relax the muscles in those areas. How will I feel when I quit smoking? Day 1 to 3 weeks Within the first 24 hours, you may start to have some problems that come from quitting tobacco. These problems are very bad 2-3 days after you quit, but they do not often last for more than 2-3 weeks. You may get these symptoms:  Mood swings.  Feeling restless, nervous, angry, or annoyed.  Trouble concentrating.  Dizziness.  Strong desire for high-sugar foods and nicotine.  Weight gain.  Trouble pooping (constipation).  Feeling like you may vomit (nausea).  Coughing or a sore throat.  Changes in how the medicines that you take for other issues work  in your body.  Depression.  Trouble sleeping (insomnia). Week 3 and afterward After the first 2-3 weeks of quitting, you may start to notice more positive results, such as:  Better sense of smell and taste.  Less coughing and sore throat.  Slower heart rate.  Lower blood pressure.  Clearer skin.  Better breathing.  Fewer sick days. Quitting smoking can be hard. Do not give up if you fail the first time. Some people need to try a few times before they succeed. Do your best to stick to your quit plan, and talk with your doctor if you have any questions or concerns. Summary  Smoking tobacco is the leading cause of preventable death. Quitting smoking can be hard, but it is one of the best things that you can do for your health.  When you decide to quit smoking, make a plan to help you succeed.  Quit smoking right away, not slowly over a period of time.  When you start quitting, seek help from your doctor, family, or friends. This information is not intended to replace advice given to you by your health care provider. Make sure you discuss any questions you have with your health care provider. Document Revised: 01/23/2019  Document Reviewed: 07/19/2018 Elsevier Patient Education  [place].

## 2020-01-04 NOTE — Telephone Encounter (Addendum)
I spoke with medical oncology on about Jared Shea, his PET scan was reassuring and showed lesser volume disease, because of this he is a candidate for chemoradiation which has been discussed briefly.  Patient is currently in the process of having a SRS treatment tomorrow followed by surgical resection later this week.  I spent time with him talking about chemoradiation, and the patient is interested in proceeding. I also spent time discussing his case with his daughter Jared Shea and she is in agreement with our plans for chemoRT. Simulation was reserved for 9/8 at 9 am for the chest with plans to start chemoRT on 01/25/20. I recommended that he take 1/2 tab of his Dexamethasone (4 mg strength tablets) twice daily until surgery, and that Dr. Christella Noa will make changes postoperatively. I also recommended to the patient and his daughter about contacting PCP with his blood sugar levels. Jared Shea will call today.     Carola Rhine, PAC

## 2020-01-04 NOTE — Progress Notes (Signed)
Called Dr. Eileen Stanford and pt scheduled to see him sept 2 at 2:15 pm - Pt and family member aware.

## 2020-01-04 NOTE — Pre-Procedure Instructions (Addendum)
Your procedure is scheduled on Thursday, August 26th.  Report to [facility] Main Entrance "A" at 6:00 A.M., and check in at the Admitting office.  Call this number if you have problems the morning of surgery:  [phone]  Call [phone] if you have any questions prior to your surgery date Monday-Friday 8am-4pm    Remember:  Do not eat or drink after midnight the night before your surgery    Take these medicines the morning of surgery with A SIP OF WATER  dexamethasone (DECADRON)  levETIRAcetam (KEPPRA)  omeprazole (PRILOSEC) prochlorperazine (COMPAZINE) -if needed chlorproMAZINE (THORAZINE)-if needed  As of today, STOP taking any Aspirin (unless otherwise instructed by your surgeon) Aleve, Naproxen, Ibuprofen, Motrin, Advil, Goody's, BC's, all herbal medications, fish oil, and all vitamins.                      Do not wear jewelry.             Do not wear lotions, powders, colognes, or deodorant.            Men may shave face and neck.            Do not bring valuables to the hospital.            [facility] is not responsible for any belongings or valuables.  Do NOT Smoke (Tobacco/Vaping) or drink Alcohol 24 hours prior to your procedure If you use a CPAP at night, you may bring all equipment for your overnight stay.   Contacts, glasses, dentures or bridgework may not be worn into surgery.      For patients admitted to the hospital, discharge time will be determined by your treatment team.   Patients discharged the day of surgery will not be allowed to drive home, and someone needs to stay with them for 24 hours.    Special instructions:   [facility]- Preparing For Surgery  Before surgery, you can play an important role. Because skin is not sterile, your skin needs to be as free of germs as possible. You can reduce the number of germs on your skin by washing with CHG (chlorahexidine gluconate) Soap before surgery.  CHG is an antiseptic cleaner which kills germs and  bonds with the skin to continue killing germs even after washing.    Oral Hygiene is also important to reduce your risk of infection.  Remember - BRUSH YOUR TEETH THE MORNING OF SURGERY WITH YOUR REGULAR TOOTHPASTE  Please do not use if you have an allergy to CHG or antibacterial soaps. If your skin becomes reddened/irritated stop using the CHG.  Do not shave (including legs and underarms) for at least 48 hours prior to first CHG shower. It is OK to shave your face.  Please follow these instructions carefully.   1. Shower the NIGHT BEFORE SURGERY and the MORNING OF SURGERY with CHG Soap.   2. If you chose to wash your hair, wash your hair first as usual with your normal shampoo.  3. After you shampoo, rinse your hair and body thoroughly to remove the shampoo.  4. Use CHG as you would any other liquid soap. You can apply CHG directly to the skin and wash gently with a scrungie or a clean washcloth.   5. Apply the CHG Soap to your body ONLY FROM THE NECK DOWN.  Do not use on open wounds or open sores. Avoid contact with your eyes, ears, mouth and genitals (private parts). Wash  Face and genitals (private parts)  with your normal soap.   6. Wash thoroughly, paying special attention to the area where your surgery will be performed.  7. Thoroughly rinse your body with warm water from the neck down.  8. DO NOT shower/wash with your normal soap after using and rinsing off the CHG Soap.  9. Pat yourself dry with a CLEAN TOWEL.  10. Wear CLEAN PAJAMAS to bed the night before surgery  11. Place CLEAN SHEETS on your bed the night of your first shower and DO NOT SLEEP WITH PETS.   Day of Surgery: Wear Clean/Comfortable clothing the morning of surgery Do not apply any deodorants/lotions.   Remember to brush your teeth WITH YOUR REGULAR TOOTHPASTE.   Please read over the following fact sheets that you were given.

## 2020-01-04 NOTE — Progress Notes (Signed)
PCP - Dr. Nathanial Rancher Cardiologist - denies  Chest x-ray - 12/14/19 EKG - 12/14/19 Stress Test -10/20/18  ECHO - 10/20/18 Cardiac Cath -denies   Sleep Study - denies CPAP - denies  CBG 286 at PAT. Pt denies history of DM. Will check A1C level. Pt states that he is on steroids and that's why sugars are elevated.   Blood Thinner Instructions: n/a Aspirin Instructions: n/a  ERAS Protcol -n/a PRE-SURGERY Ensure or G2- n/a  COVID TEST- 01/04/20 after PAT appointment.   Anesthesia review: Yes. Pt started to exhibit extreme cramping in his left hand at the beginning of the appointment. Pt stated that this was new. Left hand began to exhibit Trousseau's sign. This RN asked patient if this began after getting his blood pressure taken and he replied yes. Ebony Hail, [place] made aware and was told to contact Dr. Lacy Duverney office for the possibility of needed more labs. LVM with callback number with office.   Patient denies shortness of breath, fever, cough and chest pain at PAT appointment   All instructions explained to the patient, with a verbal understanding of the material. Patient agrees to go over the instructions while at home for a better understanding. Patient also instructed to self quarantine after being tested for COVID-19. The opportunity to ask questions was provided.   Coronavirus Screening  Have you experienced the following symptoms:  Cough yes/no: No Fever (>100.77F)  yes/no: No Runny nose yes/no: No Sore throat yes/no: No Difficulty breathing/shortness of breath  yes/no: No  Have you or a family member traveled in the last 14 days and where? yes/no: No   If the patient indicates "YES" to the above questions, their PAT will be rescheduled to limit the exposure to others and, the surgeon will be notified. THE PATIENT WILL NEED TO BE ASYMPTOMATIC FOR 14 DAYS.   If the patient is not experiencing any of these symptoms, the PAT nurse will instruct them to NOT bring anyone with them to  their appointment since they may have these symptoms or traveled as well.   Please remind your patients and families that hospital visitation restrictions are in effect and the importance of the restrictions.

## 2020-01-04 NOTE — Telephone Encounter (Addendum)
  Radiation Oncology         [phone]) [phone] ________________________________  Name: Jared Shea.  [id number]  Date of Service: 01/04/20   DOB: 04-02-1956   Steroid Taper Instructions   You currently have a prescription for Dexamethasone 4 mg Tablets.   Beginning 01/05/20  Take 1/2 of a tablet (which is 2 mg) twice a day  Dr. Cyndy Freeze will make further steroid instructions following surgery on 01/07/20.   Please call our office if you have any headaches, visual changes, uncontrolled movements, nausea or vomiting.

## 2020-01-05 ENCOUNTER — Encounter (HOSPITAL_COMMUNITY): Payer: Self-pay

## 2020-01-05 ENCOUNTER — Encounter: Payer: Self-pay | Admitting: Radiation Oncology

## 2020-01-05 ENCOUNTER — Telehealth: Payer: Self-pay | Admitting: Physician Assistant

## 2020-01-05 ENCOUNTER — Encounter: Payer: Self-pay | Admitting: Internal Medicine

## 2020-01-05 ENCOUNTER — Ambulatory Visit (INDEPENDENT_AMBULATORY_CARE_PROVIDER_SITE_OTHER): Payer: Medicare Other | Admitting: Internal Medicine

## 2020-01-05 ENCOUNTER — Ambulatory Visit
Admission: RE | Admit: 2020-01-05 | Discharge: 2020-01-05 | Disposition: A | Discharge status: Home / Self Care | Payer: Medicare Other | Source: Ambulatory Visit | Attending: Radiation Oncology | Admitting: Radiation Oncology

## 2020-01-05 ENCOUNTER — Other Ambulatory Visit: Payer: Self-pay

## 2020-01-05 VITALS — BP 131/74 | HR 87 | Temp 98.4°F | Ht 73.0 in | Wt 171.2 lb

## 2020-01-05 DIAGNOSIS — F4322 Adjustment disorder with anxiety: Secondary | ICD-10-CM | POA: Diagnosis not present

## 2020-01-05 DIAGNOSIS — E119 Type 2 diabetes mellitus without complications: Secondary | ICD-10-CM | POA: Diagnosis not present

## 2020-01-05 LAB — HEMOGLOBIN A1C
Hgb A1c MFr Bld: 7.6 % — ABNORMAL HIGH (ref 4.8–5.6)
Mean Plasma Glucose: 171 mg/dL

## 2020-01-05 MED ORDER — METFORMIN HCL 500 MG PO TABS: 1000.0000 mg | ORAL_TABLET | Freq: Two times a day (BID) | ORAL | 2 refills | Status: DC

## 2020-01-05 NOTE — Progress Notes (Signed)
Mr. Behan rested with [place] for 30 minutes following his SRS treatment.  Patient denies headache, dizziness, nausea, diplopia or ringing in the ears. Denies fatigue. Patient without complaints. Understands to avoid strenuous activity for the next 24 hours and call [phone] with needs.   BP 106/73 (BP Location: Left Arm)   Pulse 60   Temp 98.7 F (37.1 C)   SpO2 100%    [employer] [place] M. Leonie Green, BSN

## 2020-01-05 NOTE — Patient Instructions (Addendum)
It was nice seeing you today! Thank you for choosing [facility] for your Primary Care.    Today we talked about:   Diabetes: We are starting you on a medication called Metformin. We will slowly start increasing the dose to avoid upset stomach. I have attached the instructions to this packet.   Anxiety: As discussed this morning, I sent in a referral in for counseling. Please contact the clinic if your anxiety is not improving.    Follow up in 4 weeks to see how you are doing with the new medication.

## 2020-01-05 NOTE — Anesthesia Preprocedure Evaluation (Addendum)
Anesthesia Evaluation  Patient identified by MRN, date of birth, ID band Patient awake    Reviewed: Allergy & Precautions, NPO status , Patient's Chart, lab work & pertinent test results  Airway Mallampati: II  TM Distance: >3 FB Neck ROM: Full    Dental no notable dental hx. (+) Poor Dentition, Dental Advisory Given, Loose, Chipped,    Pulmonary Patient abstained from smoking., former smoker,  Quit smoking 12/15/19, 48 pack year history  No inhalers   Pulmonary exam normal breath sounds clear to auscultation       Cardiovascular hypertension, Pt. on medications +CHF  Normal cardiovascular exam Rhythm:Regular Rate:Normal  Nuclear stress test 10/20/18:  Nuclear stress EF: 54%.  The study is normal. Low risk stress nuclear study with normal perfusion and normal left ventricular regional and global systolic function.   Echo 10/20/18: IMPRESSIONS  1. The left ventricle has normal systolic function with an ejection  fraction of 60-65%. The cavity size was normal. There is mildly increased  left ventricular wall thickness. Left ventricular diastolic parameters  were normal. No evidence of left  ventricular regional wall motion abnormalities.  2. The right ventricle has normal systolic function. The cavity was  normal. There is no increase in right ventricular wall thickness.  3. The aortic valve is tricuspid. Mild sclerosis of the aortic valve.  Aortic valve regurgitation is trivial by color flow Doppler.     Neuro/Psych Seizures - (last seizure about 2 mo ago), Well Controlled,  recently discovered brain mass and mediastinal PET avid LAD s/p EBUS TBNA with diagnosis of stage 4 NSCLC negative psych ROS   GI/Hepatic Neg liver ROS, GERD  Medicated and Controlled,  Endo/Other  diabetes, Well Controlled, Type 2, Oral Hypoglycemic Agents7.1 FS this am 125  Renal/GU Renal InsufficiencyRenal diseaseCr 1.08 from 0.6  negative  genitourinary   Musculoskeletal negative musculoskeletal ROS (+)   Abdominal Normal abdominal exam  (+)   Peds negative pediatric ROS (+)  Hematology negative hematology ROS (+) hct 46.8, plt 444   Anesthesia Other Findings   Reproductive/Obstetrics negative OB ROS                          Anesthesia Physical Anesthesia Plan  ASA: III  Anesthesia Plan: General   Post-op Pain Management:    Induction: Intravenous  PONV Risk Score and Plan: 2 and Ondansetron, Dexamethasone, Midazolam and Treatment may vary due to age or medical condition  Airway Management Planned: Oral ETT  Additional Equipment: Arterial line  Intra-op Plan:   Post-operative Plan: Extubation in OR and Possible Post-op intubation/ventilation  Informed Consent: I have reviewed the patients History and Physical, chart, labs and discussed the procedure including the risks, benefits and alternatives for the proposed anesthesia with the patient or authorized representative who has indicated his/her understanding and acceptance.     Dental advisory given  Plan Discussed with: CRNA  Anesthesia Plan Comments: (2 large bore PIVs, arterial line, type and screen)       Anesthesia Quick Evaluation

## 2020-01-05 NOTE — Telephone Encounter (Signed)
Scheduled appt per 8/23 los and sch msg - pt daughter aware of apts added.

## 2020-01-06 ENCOUNTER — Telehealth: Payer: Self-pay | Admitting: Pulmonary Disease

## 2020-01-06 ENCOUNTER — Ambulatory Visit (INDEPENDENT_AMBULATORY_CARE_PROVIDER_SITE_OTHER): Payer: Medicare Other | Admitting: Pulmonary Disease

## 2020-01-06 ENCOUNTER — Encounter: Payer: Self-pay | Admitting: Pulmonary Disease

## 2020-01-06 VITALS — BP 128/80 | HR 82 | Ht 73.0 in | Wt 170.0 lb

## 2020-01-06 DIAGNOSIS — F4321 Adjustment disorder with depressed mood: Secondary | ICD-10-CM | POA: Insufficient documentation

## 2020-01-06 DIAGNOSIS — E119 Type 2 diabetes mellitus without complications: Secondary | ICD-10-CM | POA: Insufficient documentation

## 2020-01-06 DIAGNOSIS — C3490 Malignant neoplasm of unspecified part of unspecified bronchus or lung: Secondary | ICD-10-CM | POA: Diagnosis not present

## 2020-01-06 DIAGNOSIS — E1165 Type 2 diabetes mellitus with hyperglycemia: Secondary | ICD-10-CM | POA: Insufficient documentation

## 2020-01-06 DIAGNOSIS — F4322 Adjustment disorder with anxiety: Secondary | ICD-10-CM | POA: Insufficient documentation

## 2020-01-06 HISTORY — DX: Adjustment disorder with depressed mood: F43.21

## 2020-01-06 HISTORY — DX: Type 2 diabetes mellitus with hyperglycemia: E11.65

## 2020-01-06 NOTE — Telephone Encounter (Signed)
Hunsucker, Bonna Gains, MD  You 4 hours ago (12:29 PM)  [place] I am reassured that his oxygen level was good today. I reviewed his labs from 2 days ago and from earlier this month. There is no indication that his CO2 has been elevated as his bicarbonate level was totally normal. I would expect to see elevated bicarbonate levels if the CO2 has been elevated. I do not think an ABG is necessary at this time.       Called and spoke with pt's daughter letting her know the info stated by Dr. Silas Flood and she verbalized understanding. Nothing further needed.

## 2020-01-06 NOTE — Assessment & Plan Note (Addendum)
Jared Shea was asked to follow-up with his PCP by oncology after noticing his sugars were consistently elevated in the 300s.  He denies any known previous diagnosis of diabetes.  Assessment/plan: Patient sugars lately have very likely been elevated by high-dose Decadron.  No previous diagnosis of diabetes and no previous A1c on file.  However, patient had a presumed fasting glucose (collected at 6 AM) that is 131 on December 15, 2019, consistent with a diabetes diagnosis.  This was prior to his initiation of Decadron.   Discussed with patient initiating Metformin.  He is hesitant given the GI side effects that can occur with it but is willing to try.  Encouraged him to contact the clinic should he have any difficulty with the new medication.  -Provided information/packet on diabetes -Begin Metformin at 500 mg once per day, with titration instructions provided -Follow-up in 4 weeks to assess need for adjustment in medication; encouraged to call sooner if he is having severe side effects

## 2020-01-06 NOTE — Progress Notes (Signed)
   CC: Elevated glucose  HPI:  Jared Shea. is a 64 y.o. with a PMHx as listed below who presents to the clinic for elevated glucose.   Please see the Encounters tab for problem-based Assessment & Plan regarding status of patient's acute and chronic conditions.  Past Medical History:  Diagnosis Date  . Brain mass   . Bursitis of right hip   . Chest pain 08/12/2018  . Chronic cough   . Dependence on nicotine from cigarettes   . Diabetes mellitus without complication ([place])   . Essential hypertension 07/28/2018  . Seizures ([place])   . Viral illness 03/23/2019   Review of Systems: Review of Systems  Constitutional: Positive for malaise/fatigue. Negative for chills and fever.  Gastrointestinal: Negative for abdominal pain, diarrhea, nausea and vomiting.  Neurological: Negative for dizziness, tingling, sensory change, focal weakness, seizures, weakness and headaches.  Psychiatric/Behavioral: Negative for depression, substance abuse and suicidal ideas. The patient is nervous/anxious and has insomnia.    Physical Exam:  Vitals:   01/05/20 0938  BP: 131/74  Pulse: 87  Temp: 98.4 F (36.9 C)  TempSrc: Oral  SpO2: 100%  Weight: 171 lb 3.2 oz (77.7 kg)  Height: 6\' 1"  (1.854 m)    Physical Exam Constitutional:      General: He is awake.     Appearance: He is normal weight.  Cardiovascular:     Rate and Rhythm: Normal rate and regular rhythm.  Pulmonary:     Effort: Pulmonary effort is normal. No respiratory distress.     Breath sounds: No wheezing or rales.  Abdominal:     General: Bowel sounds are normal. There is no distension.     Palpations: Abdomen is soft.     Tenderness: There is no abdominal tenderness.  Skin:    General: Skin is warm and dry.  Neurological:     General: No focal deficit present.     Mental Status: He is oriented to person, place, and time. Mental status is at baseline.  Psychiatric:        Mood and Affect: Mood normal. Affect is flat.         Speech: Speech normal.        Behavior: Behavior normal. Behavior is cooperative.        Thought Content: Thought content normal. Thought content does not include suicidal ideation. Thought content does not include suicidal plan.        Judgment: Judgment normal.    Assessment & Plan:   See Encounters Tab for problem based charting.  Patient discussed with Dr. Evette Doffing

## 2020-01-06 NOTE — Patient Instructions (Addendum)
Nice to meet you!  Please call us if we can help in any way - especially if your breathing changes or worsens.

## 2020-01-06 NOTE — Telephone Encounter (Signed)
Called and spoke with pt's daughter Arita Miss (vizualized her on [place] which was filled out at today's consult appt). Arita Miss stated that pt has been more confused recently and she wonders if it could be to elevated CO2 levels. She stated that she bought a pulse ox and when she last checked pt's levels, at rest with sitting she said it was reading in the 70s but when pt stood up, sats went up to the 80s.  I stated to her that at today's visit, pt's sats were good as they were 98% on room air and this was after pt had ambulated from lobby to the room.  Even though pt's sats were good today, with pt being confused more recently, she is wondering if an ABG could be checked. Arita Miss knows that pt does have the tumor and that that could be a cause of the confusion but she also is still wanting an ABG to be drawn if okayed by MD.  Dr. Silas Flood, please advise on this if you think it is necessary for pt to have ABG drawn.

## 2020-01-06 NOTE — Progress Notes (Addendum)
Patient ID: Jared Urbanek., male    DOB: [birth year], 64 y.o.   MRN: [id number]  Chief Complaint  Patient presents with  . Consult    Pt is a self referral to follow up on recent bronchoschop that was performed.  Pt also had a PET scan performed. Pt denies any complaints of SOB. Pt does have an occ cough and will cough up clear phlegm.    Referring provider: Jean Rosenthal, MD  HPI:   [name] is a 64 year old man with ~50 pack year smoking history, DM with recently discovered brain mass and mediastinal PET avid LAD s/p EBUS TBNA with diagnosis of stage 4 NSCLC whom we are seeing in consultation at the request of Nathanial Rancher, MD for evaluation of lung cancer and post-bronchoscopy care.  Notes from medical and radiation oncology reviewed.  Patient has no breathing complaint.  No shortness of breath dyspnea exertion.  No orthopnea or PND.  No lower extremity swelling.  Occasional cough.  Not very bothersome.  Patient with 2 to 52-monthhistory of headache.  Was seen in clinic late July with concern for focal seizure with lip twitching.  Went to ED and CT and subsequent MRI demonstrated right frontal cortex mass.  Small left upper lobe nodule.  Started on Decadron for vasogenic edema.  Underwent outpatient bronchoscopy EBUS TB NA with subcarinal lymph node positive for non-small cell adenocarcinoma.  PET scan demonstrated PET avid mediastinal lymphadenopathy with non-PET avid left upper lobe nodule on my interpretation.  Has met with oncology and started radiation to brain as outpatient.  His Decadron was tapered down in the outpatient setting.  However, he is currently not taking any Decadron.  He is to undergo craniotomy and surgical resection of brain tumor tomorrow.  Reconvene's with medical oncology in the next couple weeks with tentative plan to start carboplatin based chemotherapy for local lymphadenopathy.  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  MMRC: No flowsheet data found.  Epworth:  No flowsheet data found.  Tests:   FENO:  No results found for: NITRICOXIDE  PFT: No flowsheet data found.  WALK:  No flowsheet data found.  Imaging: DG Chest 2 View  Result Date: 12/14/2019 CLINICAL DATA:  64year old male with shortness of breath. Brain metastasis. History of smoking. EXAM: CHEST - 2 VIEW COMPARISON:  Chest radiograph dated 06/23/2018. FINDINGS: Background of emphysema. No focal consolidation, pleural effusion, or pneumothorax. The cardiac silhouette is within limits. No acute osseous pathology. IMPRESSION: No active cardiopulmonary disease. Electronically Signed   By: AAnner CreteM.D.   On: 12/14/2019 19:53   CT HEAD WO CONTRAST  Result Date: 12/14/2019 CLINICAL DATA:  Seizure without known head injury. EXAM: CT HEAD WITHOUT CONTRAST TECHNIQUE: Contiguous axial images were obtained from the base of the skull through the vertex without intravenous contrast. COMPARISON:  12/27/2019 FINDINGS: Brain: The brainstem and cerebellum are normal. Left cerebral hemispheres normal. In the right posterior frontal and frontoparietal junction region, there is a pronounced vasogenic edema pattern suggesting the presence of an underlying tumor. That is not specifically clearly defined by this noncontrast head CT. Cerebritis is less likely. MRI with and without contrast recommended. No evidence of hemorrhage, hydrocephalus or extra-axial collection. No midline  shift. Vascular: There is atherosclerotic calcification of the major vessels at the base of the brain. Skull: Negative Sinuses/Orbits: Clear/normal Other: None IMPRESSION: Prominent region of vasogenic edema in the right posterior frontal region and frontoparietal  junction region worrisome for the presence of an occult underlying mass. Cerebritis is less likely. MRI with and without contrast recommended. Electronically Signed   By: Nelson Chimes M.D.   On: 12/14/2019 14:49   CT Chest W Contrast  Result Date: 12/14/2019 CLINICAL DATA:  64 year old male with brain mass/metastasis. Cancer of unknown primary. EXAM: CT CHEST, ABDOMEN, AND PELVIS WITH CONTRAST TECHNIQUE: Multidetector CT imaging of the chest, abdomen and pelvis was performed following the standard protocol during bolus administration of intravenous contrast. CONTRAST:  115m OMNIPAQUE IOHEXOL 300 MG/ML  SOLN COMPARISON:  Chest radiograph dated 12/14/2019. FINDINGS: CT CHEST FINDINGS Cardiovascular: There is no cardiomegaly or pericardial effusion. Mild three-vessel coronary vascular calcification. The thoracic aorta is unremarkable. The origins of the great vessels of the aortic arch appear patent as visualized. There is a 12 mm hypodense focus in the inferior left hilum (34/3), likely mildly enlarged hilar lymph node. A pulmonary embolus is less likely but not excluded. The pulmonary arteries are otherwise unremarkable as visualized. Mediastinum/Nodes: There is mediastinal adenopathy measuring up to 2 cm in short axis in the subcarinal region. Mildly enlarged lymph node in the aortopulmonic window measures 15 mm. The esophagus and the thyroid gland are grossly unremarkable. No mediastinal fluid collection. Lungs/Pleura: Minimal bibasilar subpleural atelectasis. There is a 4 mm left upper lobe nodule (51/4). No focal consolidation, pleural effusion, or pneumothorax. The central airways are patent. Musculoskeletal: Top-normal left supraclavicular lymph node measures 8 mm in short axis. No axillary adenopathy. No acute osseous pathology. CT ABDOMEN PELVIS FINDINGS No intra-abdominal free air or free fluid. Hepatobiliary: No focal liver abnormality is seen. No gallstones, gallbladder wall thickening, or biliary  dilatation. Pancreas: Unremarkable. No pancreatic ductal dilatation or surrounding inflammatory changes. Spleen: Normal in size without focal abnormality. Adrenals/Urinary Tract: The adrenal glands unremarkable. There is no hydronephrosis on either side. There is symmetric enhancement and excretion of contrast by both kidneys. The visualized ureters and urinary bladder appear unremarkable. Stomach/Bowel: There is sigmoid diverticulosis without active inflammatory changes. There is no bowel obstruction or active inflammation. The appendix is normal. Vascular/Lymphatic: Moderate aortoiliac atherosclerotic disease. The IVC is unremarkable. No portal venous gas. Mildly enlarged peripancreatic lymph node measures 16 mm (coronal 60/7). Reproductive: The prostate and seminal vesicles are grossly unremarkable. Other: None Musculoskeletal: Degenerative changes of the spine. No acute osseous pathology. IMPRESSION: 1. Mediastinal and left hilar adenopathy of indeterminate etiology. Clinical correlation recommended. Bronchoscopy and tissue sampling may provide better evaluation if clinically indicated. 2. A 4 mm left upper lobe pulmonary nodule. No follow-up needed if patient is low-risk. Non-contrast chest CT can be considered in 12 months if patient is high-risk. This recommendation follows the consensus statement: Guidelines for Management of Incidental Pulmonary Nodules Detected on CT Images: From the Fleischner Society 2017; Radiology 2017; 284:228-243. 3. Low attenuating focus inferior to the left hilum, likely a mildly enlarged lymph node. A pulmonary embolism is less likely. 4. Sigmoid diverticulosis. No bowel obstruction. Normal appendix. 5. Aortic Atherosclerosis (ICD10-I70.0). Electronically Signed   By: AAnner CreteM.D.   On: 12/14/2019 21:40   MR Brain W Wo Contrast  Result Date: 12/31/2019 CLINICAL DATA:  64year old male with solitary enhancing brain mass at the posterior superior frontal convexity  discovered earlier this month. Mediastinal and left hilar  lymphadenopathy - with bronchoscopy positive for non-small cell lung carcinoma. Treatment planning. EXAM: MRI HEAD WITHOUT AND WITH CONTRAST TECHNIQUE: Multiplanar, multiecho pulse sequences of the brain and surrounding structures were obtained without and with intravenous contrast. CONTRAST:  84m MULTIHANCE GADOBENATE DIMEGLUMINE 529 MG/ML IV SOLN COMPARISON:  Brain MRI 12/14/2019. FINDINGS: Brain: 12 mm heterogeneously enhancing round mass in the posterior right frontal lobe situated at the margin of the superior and middle frontal gyri is stable in size and configuration from earlier this month. Trace associated blood products. Regional vasogenic edema has mildly regressed. No significant mass effect now. No other brain metastasis identified. No dural thickening. Subtle and somewhat symmetric increased T1 signal in the bilateral internal auditory canals is felt to be artifact (series 11, image 55). Attention is directed on follow-up. No restricted diffusion to suggest acute infarction. No midline shift ventriculomegaly, extra-axial collection or acute intracranial hemorrhage. Cervicomedullary junction and pituitary are within normal limits. Largely normal for age gray and white matter signal outside of the right frontal lobe. No cortical encephalomalacia. Vascular: Major intracranial vascular flow voids are stable. The major dural venous sinuses are enhancing and appear to be patent. Skull and upper cervical spine: Negative visible cervical spine and spinal cord. Visualized bone marrow signal is within normal limits. Sinuses/Orbits: Stable and negative. Other: Mastoids are clear. Normal noncontrast appearance of the internal auditory structures. Scalp and face appear negative. IMPRESSION: 1. Solitary posterior right frontal lobe brain metastasis, 12 mm and stable since 12/14/2019. Regional vasogenic edema has mildly regressed, with no significant mass  effect now. 2. Artifactual increased T1 signal suspected in the internal auditory canals (more so the left). Attention is directed on follow-up. Electronically Signed   By: HGenevie AnnM.D.   On: 12/31/2019 13:37   MR Brain W and Wo Contrast  Result Date: 12/14/2019 CLINICAL DATA:  Seizure. Abnormal CT head headache and blurred vision. EXAM: MRI HEAD WITHOUT AND WITH CONTRAST TECHNIQUE: Multiplanar, multiecho pulse sequences of the brain and surrounding structures were obtained without and with intravenous contrast. CONTRAST:  836mGADAVIST GADOBUTROL 1 MMOL/ML IV SOLN COMPARISON:  CT head 12/14/2019 FINDINGS: Brain: Large area of edema in the right posterior frontal lobe. Within this edema there is a 13.5 mm enhancing mass lesion which is located peripherally. This shows fairly intense enhancement with small internal cystic changes. The mass is intra-axial but very close to the surface of the brain. No second lesion identified. No other edema Ventricle size normal.  No midline shift.  Negative for hemorrhage. Vascular: Normal arterial flow voids. Skull and upper cervical spine: No focal skeletal lesion. Sinuses/Orbits: Paranasal sinuses clear.  Negative orbit Other: None IMPRESSION: 13.5 mm enhancing mass in the right posterior frontal cortex over the convexity. Large amount of surrounding vasogenic edema. This is most likely a solitary metastasis. Primary brain tumor a less likely consideration. Electronically Signed   By: ChFranchot Gallo.D.   On: 12/14/2019 19:29   CT ABDOMEN PELVIS W CONTRAST  Result Date: 12/14/2019 CLINICAL DATA:  [age]ear old male with brain mass/metastasis. Cancer of unknown primary. EXAM: CT CHEST, ABDOMEN, AND PELVIS WITH CONTRAST TECHNIQUE: Multidetector CT imaging of the chest, abdomen and pelvis was performed following the standard protocol during bolus administration of intravenous contrast. CONTRAST:  10012mMNIPAQUE IOHEXOL 300 MG/ML  SOLN COMPARISON:  Chest radiograph dated  12/14/2019. FINDINGS: CT CHEST FINDINGS Cardiovascular: There is no cardiomegaly or pericardial effusion. Mild three-vessel coronary vascular calcification. The thoracic aorta is unremarkable. The origins of the great  vessels of the aortic arch appear patent as visualized. There is a 12 mm hypodense focus in the inferior left hilum (34/3), likely mildly enlarged hilar lymph node. A pulmonary embolus is less likely but not excluded. The pulmonary arteries are otherwise unremarkable as visualized. Mediastinum/Nodes: There is mediastinal adenopathy measuring up to 2 cm in short axis in the subcarinal region. Mildly enlarged lymph node in the aortopulmonic window measures 15 mm. The esophagus and the thyroid gland are grossly unremarkable. No mediastinal fluid collection. Lungs/Pleura: Minimal bibasilar subpleural atelectasis. There is a 4 mm left upper lobe nodule (51/4). No focal consolidation, pleural effusion, or pneumothorax. The central airways are patent. Musculoskeletal: Top-normal left supraclavicular lymph node measures 8 mm in short axis. No axillary adenopathy. No acute osseous pathology. CT ABDOMEN PELVIS FINDINGS No intra-abdominal free air or free fluid. Hepatobiliary: No focal liver abnormality is seen. No gallstones, gallbladder wall thickening, or biliary dilatation. Pancreas: Unremarkable. No pancreatic ductal dilatation or surrounding inflammatory changes. Spleen: Normal in size without focal abnormality. Adrenals/Urinary Tract: The adrenal glands unremarkable. There is no hydronephrosis on either side. There is symmetric enhancement and excretion of contrast by both kidneys. The visualized ureters and urinary bladder appear unremarkable. Stomach/Bowel: There is sigmoid diverticulosis without active inflammatory changes. There is no bowel obstruction or active inflammation. The appendix is normal. Vascular/Lymphatic: Moderate aortoiliac atherosclerotic disease. The IVC is unremarkable. No portal  venous gas. Mildly enlarged peripancreatic lymph node measures 16 mm (coronal 60/7). Reproductive: The prostate and seminal vesicles are grossly unremarkable. Other: None Musculoskeletal: Degenerative changes of the spine. No acute osseous pathology. IMPRESSION: 1. Mediastinal and left hilar adenopathy of indeterminate etiology. Clinical correlation recommended. Bronchoscopy and tissue sampling may provide better evaluation if clinically indicated. 2. A 4 mm left upper lobe pulmonary nodule. No follow-up needed if patient is low-risk. Non-contrast chest CT can be considered in 12 months if patient is high-risk. This recommendation follows the consensus statement: Guidelines for Management of Incidental Pulmonary Nodules Detected on CT Images: From the Fleischner Society 2017; Radiology 2017; 284:228-243. 3. Low attenuating focus inferior to the left hilum, likely a mildly enlarged lymph node. A pulmonary embolism is less likely. 4. Sigmoid diverticulosis. No bowel obstruction. Normal appendix. 5. Aortic Atherosclerosis (ICD10-I70.0). Electronically Signed   By: Anner Crete M.D.   On: 12/14/2019 21:40   NM PET Image Initial (PI) Skull Base To Thigh  Result Date: 01/01/2020 CLINICAL DATA:  Initial treatment strategy for non-small cell lung cancer. Solitary brain metastasis. EXAM: NUCLEAR MEDICINE PET SKULL BASE TO THIGH TECHNIQUE: 10.2 mCi F-18 FDG was injected intravenously. Full-ring PET imaging was performed from the skull base to thigh after the radiotracer. CT data was obtained and used for attenuation correction and anatomic localization. Fasting blood glucose: 326 mg/dl COMPARISON:  Chest CT 12/14/2019 FINDINGS: Mediastinal blood pool activity: SUV max 2.63 Liver activity: SUV max 3.34 NECK: Hypermetabolic nodule in the posterior aspect of the RIGHT lobe of thyroid gland with SUV max equal 4.9. This nodule measures 8 mm on image 52/4). Incidental CT findings: none CHEST: Hypermetabolic subcarinal lymph  node with SUV max equal 4.8. Hypermetabolic lymph node posterior to the LEFT mainstem bronchus and extending into the LEFT hilum with SUV max equal 7.0. This node measures approximately 18 mm on image 81/4. There are no hypermetabolic pulmonary nodules. Small 4 mm nodule in the LEFT upper lobe (image 68/4) is no associated metabolic activity. Incidental CT findings: none ABDOMEN/PELVIS: No abnormal hypermetabolic activity within the liver, pancreas, adrenal glands,  or spleen. No hypermetabolic lymph nodes in the abdomen or pelvis. Incidental CT findings: Atherosclerotic calcification of the aorta. Prostate normal SKELETON: No focal hypermetabolic activity to suggest skeletal metastasis. Incidental CT findings: none IMPRESSION: 1. Hypermetabolic subcarinal and LEFT hilar adenopathy consistent metastatic adenopathy. 2. No hypermetabolic pulmonary nodules. 3. Hypermetabolic nodule in the RIGHT lobe of the thyroid gland. This is favored incidental finding representing adenoma however cannot exclude thyroid carcinoma or rare thyroid cancer metastasis. Consider thyroid ultrasound and tissue sampling as clinically warranted. 4. No definitive primary lesion identified. Electronically Signed   By: Suzy Bouchard M.D.   On: 01/01/2020 16:37   DG CHEST PORT 1 VIEW  Result Date: 12/21/2019 CLINICAL DATA:  Post bronchoscopy. EXAM: PORTABLE CHEST 1 VIEW COMPARISON:  12/14/2019 FINDINGS: Lungs are adequately inflated without consolidation or effusion. Possible minimal linear atelectasis left base. Cardiomediastinal silhouette and remainder the exam is unchanged. IMPRESSION: No active disease. Electronically Signed   By: Marin Olp M.D.   On: 12/21/2019 09:46    Lab Results: Reviewed and as per EMR CBC    Component Value Date/Time   WBC 16.8 (H) 01/04/2020 0856   WBC 7.1 12/14/2019 1419   RBC 5.38 01/04/2020 0856   HGB 16.4 01/04/2020 0856   HCT 46.8 01/04/2020 0856   PLT 444 (H) 01/04/2020 0856   MCV 87.0  01/04/2020 0856   MCH 30.5 01/04/2020 0856   MCHC 35.0 01/04/2020 0856   RDW 13.6 01/04/2020 0856   LYMPHSABS 3.3 01/04/2020 0856   MONOABS 1.0 01/04/2020 0856   EOSABS 0.0 01/04/2020 0856   BASOSABS 0.0 01/04/2020 0856    BMET    Component Value Date/Time   NA 131 (L) 01/04/2020 0856   NA 140 11/23/2019 1516   K 4.6 01/04/2020 0856   CL 96 (L) 01/04/2020 0856   CO2 24 01/04/2020 0856   GLUCOSE 370 (H) 01/04/2020 0856   BUN 33 (H) 01/04/2020 0856   BUN 14 11/23/2019 1516   CREATININE 1.08 01/04/2020 0856   CALCIUM 10.5 (H) 01/04/2020 0856   GFRNONAA >60 01/04/2020 0856   GFRAA >60 01/04/2020 0856    BNP No results found for: BNP  ProBNP No results found for: PROBNP  Specialty Problems      Pulmonary Problems   Pulmonary nodule   Non-small cell carcinoma of lung, stage 4 (HCC)      Allergies  Allergen Reactions  . Penicillins Rash    Immunization History  Administered Date(s) Administered  . PFIZER SARS-COV-2 Vaccination 08/10/2019, 09/10/2019    Past Medical History:  Diagnosis Date  . Brain mass   . Bursitis of right hip   . Chest pain 08/12/2018  . Chronic cough   . Dependence on nicotine from cigarettes   . Diabetes mellitus without complication ([place])   . Essential hypertension 07/28/2018  . Seizures ([place])   . Viral illness 03/23/2019    Tobacco History: Social History   Tobacco Use  Smoking Status Former Smoker  . Packs/day: 1.00  . Years: 48.00  . Pack years: 48.00  . Types: Cigarettes  . Start date: 08/07/1970  . Quit date: 12/15/2019  . Years since quitting: 0.0  Smokeless Tobacco Never Used   Counseling given: Not Answered   Continue to not smoke  Outpatient Encounter Medications as of 01/06/2020  Medication Sig  . chlorproMAZINE (THORAZINE) 25 MG tablet Take 1 tablet (25 mg total) by mouth 3 (three) times daily as needed. (Patient taking differently: Take 25 mg by mouth See  admin instructions. Take 25 mg in the morning, may take a  second 25 mg dose in the evening as needed for hiccups)  . dexamethasone (DECADRON) 4 MG tablet Take 1 tablet (4 mg total) by mouth 2 (two) times daily.  Marland Kitchen levETIRAcetam (KEPPRA) 750 MG tablet Take 1 tablet (750 mg total) by mouth 2 (two) times daily.  Marland Kitchen losartan-hydrochlorothiazide (HYZAAR) 50-12.5 MG tablet Take 1 tablet by mouth daily.  . metFORMIN (GLUCOPHAGE) 500 MG tablet Take 2 tablets (1,000 mg total) by mouth 2 (two) times daily with a meal.  . mirtazapine (REMERON) 15 MG tablet Take 1 tablet (15 mg total) by mouth at bedtime.  Marland Kitchen omeprazole (PRILOSEC) 20 MG capsule Take 1 capsule (20 mg total) by mouth daily.  . prochlorperazine (COMPAZINE) 10 MG tablet Take 1 tablet (10 mg total) by mouth every 6 (six) hours as needed.  . [DISCONTINUED] nicotine (NICODERM CQ - DOSED IN MG/24 HOURS) 21 mg/24hr patch Place 21 mg onto the skin daily.   No facility-administered encounter medications on file as of 01/06/2020.     Review of Systems  Review of Systems  No seizure symptoms, no chest pain, very poor appetite, says food do not taste like anything.  Comprehensive review of systems otherwise negative.  Physical Exam  BP 128/80 (BP Location: Right Arm, Cuff Size: Normal)   Pulse 82   Ht '6\' 1"'  (1.854 m)   Wt 170 lb (77.1 kg)   SpO2 98%   BMI 22.43 kg/m   Wt Readings from Last 5 Encounters:  01/06/20 170 lb (77.1 kg)  01/05/20 171 lb 3.2 oz (77.7 kg)  01/04/20 170 lb 9.6 oz (77.4 kg)  01/04/20 170 lb 3.2 oz (77.2 kg)  12/30/19 180 lb 9.6 oz (81.9 kg)    BMI Readings from Last 5 Encounters:  01/06/20 22.43 kg/m  01/05/20 22.59 kg/m  01/04/20 22.51 kg/m  01/04/20 22.46 kg/m  12/30/19 23.83 kg/m     Physical Exam General: Thin, in no acute distress Eyes: EOMI, no icterus Neck: Supple, no JVP appreciated Pulmonary: Clear to auscultation bilaterally, no wheezes Cardiovascular: Regular in rhythm, no murmurs Abdomen, soft, nontender MSK: No effusion, no synovitis Neuro:  Normal gait, no weakness Psych: Normal mood, flat affect   Assessment & Plan:   Mr. Dashiell is a 64 year old man with ~50 pack year smoking history, DM with recently discovered brain mass and mediastinal PET avid LAD s/p EBUS TBNA with diagnosis of stage 4 NSCLC whom we are seeing in consultation at the request of Nathanial Rancher, MD for evaluation of lung cancer and post-bronchoscopy care.   Discussed at length the diagnosis and presumed ramifications for metastases outside of the thorax.  Hopeful with surgical resection of brain lesion and aggressive treatment, he will do well and disease burden will be well controlled for a long time.  Encouraged him to continue to ask questions and discussed care and expectations with medical oncology at upcoming visit.  Hopeful that his appetite will improve with reduction of tumor burden but encouraged him to continue to raise concern for poor appetite as our oncology colleagues are quite effective at palliating the symptom.  Fortunately, he has no respiratory complaint.  Encouraged him to call us with any change or worsening in symptoms.   Return if symptoms worsen or fail to improve.   Lanier Clam, MD 01/06/2020

## 2020-01-06 NOTE — Assessment & Plan Note (Signed)
[name] states that since he was diagnosed with stage IV lung cancer, he has been having significant anxiety including racing thoughts that are difficult to turn off, inability to fall asleep or stay asleep.  He sometimes feels like he would be better off dead, but has no suicidal ideation or plan.  These symptoms have negatively affected his life.  Assessment/plan: Significant anxiety with GAD-7 score of 18 in light of recent cancer diagnosis.  Given the severity, recommend patient start with counseling.  Will reassess in 4 weeks.  -Referral to [facility]

## 2020-01-06 NOTE — Assessment & Plan Note (Signed)
>>  ASSESSMENT AND PLAN FOR UNCONTROLLED TYPE 2 DIABETES MELLITUS WITH HYPERGLYCEMIA, WITHOUT LONG-TERM CURRENT USE OF INSULIN  (HCC) WRITTEN ON 01/06/2020 11:14 AM BY ARNETT SAUNDERS, MD  Mr. Cloria was asked to follow-up with his PCP by oncology after noticing his sugars were consistently elevated in the 300s.  He denies any known previous diagnosis of diabetes.  Assessment/plan: Patient sugars lately have very likely been elevated by high-dose Decadron .  No previous diagnosis of diabetes and no previous A1c on file.  However, patient had a presumed fasting glucose (collected at 6 AM) that is 131 on December 15, 2019, consistent with a diabetes diagnosis.  This was prior to his initiation of Decadron .   Discussed with patient initiating Metformin .  He is hesitant given the GI side effects that can occur with it but is willing to try.  Encouraged him to contact the clinic should he have any difficulty with the new medication.  -Provided information/packet on diabetes -Begin Metformin  at 500 mg once per day, with titration instructions provided -Follow-up in 4 weeks to assess need for adjustment in medication; encouraged to call sooner if he is having severe side effects

## 2020-01-07 ENCOUNTER — Inpatient Hospital Stay (HOSPITAL_COMMUNITY)
Admission: RE | Admit: 2020-01-07 | Discharge: 2020-01-09 | DRG: 025 | Disposition: A | Discharge status: Home / Self Care | Payer: Medicare Other | Attending: Neurosurgery | Admitting: Neurosurgery

## 2020-01-07 ENCOUNTER — Other Ambulatory Visit: Payer: Self-pay

## 2020-01-07 ENCOUNTER — Encounter (HOSPITAL_COMMUNITY): Payer: Self-pay | Admitting: Neurosurgery

## 2020-01-07 ENCOUNTER — Inpatient Hospital Stay (HOSPITAL_COMMUNITY): Payer: Medicare Other

## 2020-01-07 ENCOUNTER — Encounter (HOSPITAL_COMMUNITY)
Admission: RE | Disposition: A | Discharge status: Home / Self Care | Payer: Self-pay | Source: Home / Self Care | Attending: Neurosurgery

## 2020-01-07 ENCOUNTER — Inpatient Hospital Stay (HOSPITAL_COMMUNITY): Payer: Medicare Other | Admitting: Vascular Surgery

## 2020-01-07 ENCOUNTER — Other Ambulatory Visit: Payer: Self-pay | Admitting: Radiation Therapy

## 2020-01-07 DIAGNOSIS — Z940 Kidney transplant status: Secondary | ICD-10-CM | POA: Diagnosis not present

## 2020-01-07 DIAGNOSIS — R531 Weakness: Secondary | ICD-10-CM | POA: Diagnosis present

## 2020-01-07 DIAGNOSIS — Z803 Family history of malignant neoplasm of breast: Secondary | ICD-10-CM

## 2020-01-07 DIAGNOSIS — K219 Gastro-esophageal reflux disease without esophagitis: Secondary | ICD-10-CM | POA: Diagnosis present

## 2020-01-07 DIAGNOSIS — R519 Headache, unspecified: Secondary | ICD-10-CM | POA: Diagnosis present

## 2020-01-07 DIAGNOSIS — F419 Anxiety disorder, unspecified: Secondary | ICD-10-CM | POA: Diagnosis present

## 2020-01-07 DIAGNOSIS — Z85118 Personal history of other malignant neoplasm of bronchus and lung: Secondary | ICD-10-CM

## 2020-01-07 DIAGNOSIS — Z880 Allergy status to penicillin: Secondary | ICD-10-CM | POA: Diagnosis not present

## 2020-01-07 DIAGNOSIS — Z833 Family history of diabetes mellitus: Secondary | ICD-10-CM

## 2020-01-07 DIAGNOSIS — Z841 Family history of disorders of kidney and ureter: Secondary | ICD-10-CM | POA: Diagnosis not present

## 2020-01-07 DIAGNOSIS — R202 Paresthesia of skin: Secondary | ICD-10-CM | POA: Diagnosis present

## 2020-01-07 DIAGNOSIS — Z8249 Family history of ischemic heart disease and other diseases of the circulatory system: Secondary | ICD-10-CM | POA: Diagnosis not present

## 2020-01-07 DIAGNOSIS — Z20822 Contact with and (suspected) exposure to covid-19: Secondary | ICD-10-CM | POA: Diagnosis present

## 2020-01-07 DIAGNOSIS — C7931 Secondary malignant neoplasm of brain: Secondary | ICD-10-CM | POA: Diagnosis present

## 2020-01-07 DIAGNOSIS — E041 Nontoxic single thyroid nodule: Secondary | ICD-10-CM | POA: Diagnosis present

## 2020-01-07 DIAGNOSIS — R253 Fasciculation: Secondary | ICD-10-CM | POA: Diagnosis present

## 2020-01-07 DIAGNOSIS — G936 Cerebral edema: Secondary | ICD-10-CM | POA: Diagnosis present

## 2020-01-07 DIAGNOSIS — I10 Essential (primary) hypertension: Secondary | ICD-10-CM | POA: Diagnosis present

## 2020-01-07 DIAGNOSIS — Z79899 Other long term (current) drug therapy: Secondary | ICD-10-CM | POA: Diagnosis not present

## 2020-01-07 DIAGNOSIS — C779 Secondary and unspecified malignant neoplasm of lymph node, unspecified: Secondary | ICD-10-CM | POA: Diagnosis present

## 2020-01-07 DIAGNOSIS — G4700 Insomnia, unspecified: Secondary | ICD-10-CM | POA: Diagnosis present

## 2020-01-07 DIAGNOSIS — F17210 Nicotine dependence, cigarettes, uncomplicated: Secondary | ICD-10-CM | POA: Diagnosis present

## 2020-01-07 DIAGNOSIS — Z823 Family history of stroke: Secondary | ICD-10-CM

## 2020-01-07 DIAGNOSIS — C799 Secondary malignant neoplasm of unspecified site: Secondary | ICD-10-CM | POA: Diagnosis present

## 2020-01-07 DIAGNOSIS — D496 Neoplasm of unspecified behavior of brain: Secondary | ICD-10-CM | POA: Diagnosis present

## 2020-01-07 HISTORY — PX: APPLICATION OF CRANIAL NAVIGATION: SHX6578

## 2020-01-07 HISTORY — PX: CRANIOTOMY: SHX93

## 2020-01-07 LAB — GLUCOSE, CAPILLARY
Glucose-Capillary: 125 mg/dL — ABNORMAL HIGH (ref 70–99)
Glucose-Capillary: 132 mg/dL — ABNORMAL HIGH (ref 70–99)

## 2020-01-07 LAB — CBC
HCT: 38.5 % — ABNORMAL LOW (ref 39.0–52.0)
Hemoglobin: 13 g/dL (ref 13.0–17.0)
MCH: 30.9 pg (ref 26.0–34.0)
MCHC: 33.8 g/dL (ref 30.0–36.0)
MCV: 91.4 fL (ref 80.0–100.0)
Platelets: 352 10*3/uL (ref 150–400)
RBC: 4.21 MIL/uL — ABNORMAL LOW (ref 4.22–5.81)
RDW: 13.5 % (ref 11.5–15.5)
WBC: 9.5 10*3/uL (ref 4.0–10.5)
nRBC: 0 % (ref 0.0–0.2)

## 2020-01-07 LAB — CREATININE, SERUM
Creatinine, Ser: 0.77 mg/dL (ref 0.61–1.24)
GFR calc Af Amer: >60 mL/min (ref >60)
GFR calc non Af Amer: >60 mL/min (ref >60)

## 2020-01-07 LAB — ABO/RH: ABO/RH(D): O POS

## 2020-01-07 SURGERY — CRANIOTOMY TUMOR EXCISION
Anesthesia: General | Site: Head | Laterality: Right

## 2020-01-07 MED ORDER — ONDANSETRON HCL 4 MG PO TABS: 4.0000 mg | ORAL_TABLET | ORAL | Status: DC | PRN

## 2020-01-07 MED ORDER — CHLORPROMAZINE HCL 25 MG PO TABS
25.0000 mg | ORAL_TABLET | Freq: Every evening | ORAL | Status: DC | PRN
  Filled 2020-01-07: qty 1

## 2020-01-07 MED ORDER — SUGAMMADEX SODIUM 200 MG/2ML IV SOLN
INTRAVENOUS | Status: DC | PRN
  Administered 2020-01-07: 300 mg via INTRAVENOUS

## 2020-01-07 MED ORDER — FENTANYL CITRATE (PF) 250 MCG/5ML IJ SOLN
INTRAMUSCULAR | Status: AC
Start: 2020-01-07 — End: ?
  Filled 2020-01-07: qty 5

## 2020-01-07 MED ORDER — THROMBIN 5000 UNITS EX SOLR
CUTANEOUS | Status: AC
  Filled 2020-01-07: qty 5000

## 2020-01-07 MED ORDER — LACTATED RINGERS IV SOLN: INTRAVENOUS | Status: DC

## 2020-01-07 MED ORDER — LABETALOL HCL 5 MG/ML IV SOLN: 10.0000 mg | INTRAVENOUS | Status: DC | PRN

## 2020-01-07 MED ORDER — DEXAMETHASONE SODIUM PHOSPHATE 10 MG/ML IJ SOLN
INTRAMUSCULAR | Status: DC | PRN
  Administered 2020-01-07: 10 mg via INTRAVENOUS

## 2020-01-07 MED ORDER — PROPOFOL 10 MG/ML IV BOLUS
INTRAVENOUS | Status: DC | PRN
  Administered 2020-01-07: 20 mg via INTRAVENOUS
  Administered 2020-01-07: 150 mg via INTRAVENOUS

## 2020-01-07 MED ORDER — IPRATROPIUM-ALBUTEROL 0.5-2.5 (3) MG/3ML IN SOLN
3.0000 mL | RESPIRATORY_TRACT | Status: DC
Start: 2020-01-07 — End: 2020-01-07

## 2020-01-07 MED ORDER — IPRATROPIUM-ALBUTEROL 0.5-2.5 (3) MG/3ML IN SOLN
3.0000 mL | Freq: Once | RESPIRATORY_TRACT | Status: AC
  Administered 2020-01-07: 3 mL via RESPIRATORY_TRACT

## 2020-01-07 MED ORDER — LIDOCAINE 2% (20 MG/ML) 5 ML SYRINGE
INTRAMUSCULAR | Status: DC | PRN
  Administered 2020-01-07: 60 mg via INTRAVENOUS

## 2020-01-07 MED ORDER — ONDANSETRON HCL 4 MG/2ML IJ SOLN
INTRAMUSCULAR | Status: DC | PRN
  Administered 2020-01-07: 4 mg via INTRAVENOUS

## 2020-01-07 MED ORDER — LEVETIRACETAM 750 MG PO TABS
750.0000 mg | ORAL_TABLET | Freq: Two times a day (BID) | ORAL | Status: DC
  Administered 2020-01-07 – 2020-01-09 (×4): 750 mg via ORAL
  Filled 2020-01-07 (×4): qty 1

## 2020-01-07 MED ORDER — ESMOLOL HCL 100 MG/10ML IV SOLN
INTRAVENOUS | Status: AC
  Filled 2020-01-07: qty 10

## 2020-01-07 MED ORDER — CHLORHEXIDINE GLUCONATE CLOTH 2 % EX PADS: 6.0000 | MEDICATED_PAD | Freq: Once | CUTANEOUS | Status: DC

## 2020-01-07 MED ORDER — HEMOSTATIC AGENTS (NO CHARGE) OPTIME
TOPICAL | Status: DC | PRN
  Administered 2020-01-07: 1 via TOPICAL

## 2020-01-07 MED ORDER — DEXAMETHASONE 4 MG PO TABS: 4.0000 mg | ORAL_TABLET | Freq: Three times a day (TID) | ORAL | Status: DC

## 2020-01-07 MED ORDER — ROCURONIUM BROMIDE 10 MG/ML (PF) SYRINGE
PREFILLED_SYRINGE | INTRAVENOUS | Status: DC | PRN
  Administered 2020-01-07: 30 mg via INTRAVENOUS
  Administered 2020-01-07: 20 mg via INTRAVENOUS
  Administered 2020-01-07: 100 mg via INTRAVENOUS

## 2020-01-07 MED ORDER — ORAL CARE MOUTH RINSE: 15.0000 mL | Freq: Once | OROMUCOSAL | Status: AC

## 2020-01-07 MED ORDER — PNEUMOCOCCAL VAC POLYVALENT 25 MCG/0.5ML IJ INJ: 0.5000 mL | INJECTION | INTRAMUSCULAR | Status: DC

## 2020-01-07 MED ORDER — SUCCINYLCHOLINE CHLORIDE 200 MG/10ML IV SOSY
PREFILLED_SYRINGE | INTRAVENOUS | Status: AC
  Filled 2020-01-07: qty 10

## 2020-01-07 MED ORDER — HYDROCHLOROTHIAZIDE 12.5 MG PO CAPS
12.5000 mg | ORAL_CAPSULE | Freq: Every day | ORAL | Status: DC
  Administered 2020-01-08 – 2020-01-09 (×2): 12.5 mg via ORAL
  Filled 2020-01-07 (×2): qty 1

## 2020-01-07 MED ORDER — CHLORHEXIDINE GLUCONATE CLOTH 2 % EX PADS
6.0000 | MEDICATED_PAD | Freq: Every day | CUTANEOUS | Status: DC
  Administered 2020-01-07 – 2020-01-09 (×3): 6 via TOPICAL

## 2020-01-07 MED ORDER — PHENYLEPHRINE HCL-NACL 10-0.9 MG/250ML-% IV SOLN
INTRAVENOUS | Status: DC | PRN
  Administered 2020-01-07: 30 ug/min via INTRAVENOUS

## 2020-01-07 MED ORDER — ESMOLOL HCL 100 MG/10ML IV SOLN
INTRAVENOUS | Status: DC | PRN
  Administered 2020-01-07 (×2): 20 mg via INTRAVENOUS

## 2020-01-07 MED ORDER — PANTOPRAZOLE SODIUM 40 MG IV SOLR: 40.0000 mg | Freq: Every day | INTRAVENOUS | Status: DC

## 2020-01-07 MED ORDER — LOSARTAN POTASSIUM-HCTZ 50-12.5 MG PO TABS: 1.0000 | ORAL_TABLET | Freq: Every day | ORAL | Status: DC

## 2020-01-07 MED ORDER — FENTANYL CITRATE (PF) 100 MCG/2ML IJ SOLN
INTRAMUSCULAR | Status: DC | PRN
Start: 2020-01-07 — End: 2020-01-07
  Administered 2020-01-07 (×7): 50 ug via INTRAVENOUS

## 2020-01-07 MED ORDER — HYDROCODONE-ACETAMINOPHEN 5-325 MG PO TABS
1.0000 | ORAL_TABLET | ORAL | Status: DC | PRN
  Administered 2020-01-07 – 2020-01-09 (×7): 1 via ORAL
  Filled 2020-01-07 (×7): qty 1

## 2020-01-07 MED ORDER — LIDOCAINE-EPINEPHRINE 0.5 %-1:200000 IJ SOLN
INTRAMUSCULAR | Status: DC | PRN
  Administered 2020-01-07: 6 mL

## 2020-01-07 MED ORDER — DEXAMETHASONE 4 MG PO TABS
4.0000 mg | ORAL_TABLET | Freq: Three times a day (TID) | ORAL | Status: AC
  Administered 2020-01-07 – 2020-01-08 (×4): 4 mg via ORAL
  Filled 2020-01-07 (×4): qty 1

## 2020-01-07 MED ORDER — CHLORHEXIDINE GLUCONATE 0.12 % MT SOLN
15.0000 mL | Freq: Once | OROMUCOSAL | Status: AC
  Administered 2020-01-07: 15 mL via OROMUCOSAL
  Filled 2020-01-07: qty 15

## 2020-01-07 MED ORDER — BACITRACIN ZINC 500 UNIT/GM EX OINT
TOPICAL_OINTMENT | CUTANEOUS | Status: DC | PRN
  Administered 2020-01-07: 1 via TOPICAL

## 2020-01-07 MED ORDER — EPHEDRINE SULFATE-NACL 50-0.9 MG/10ML-% IV SOSY
PREFILLED_SYRINGE | INTRAVENOUS | Status: DC | PRN
  Administered 2020-01-07: 5 mg via INTRAVENOUS

## 2020-01-07 MED ORDER — PROMETHAZINE HCL 25 MG/ML IJ SOLN: 6.2500 mg | INTRAMUSCULAR | Status: DC | PRN

## 2020-01-07 MED ORDER — PANTOPRAZOLE SODIUM 40 MG PO TBEC
40.0000 mg | DELAYED_RELEASE_TABLET | Freq: Every day | ORAL | Status: DC
  Administered 2020-01-07 – 2020-01-09 (×3): 40 mg via ORAL
  Filled 2020-01-07 (×3): qty 1

## 2020-01-07 MED ORDER — OXYCODONE HCL 5 MG PO TABS: 5.0000 mg | ORAL_TABLET | Freq: Once | ORAL | Status: DC | PRN

## 2020-01-07 MED ORDER — POTASSIUM CHLORIDE IN NACL 20-0.9 MEQ/L-% IV SOLN
INTRAVENOUS | Status: DC
  Filled 2020-01-07 (×2): qty 1000

## 2020-01-07 MED ORDER — SODIUM CHLORIDE 0.9 % IV SOLN: INTRAVENOUS | Status: DC | PRN

## 2020-01-07 MED ORDER — HYDROMORPHONE HCL 1 MG/ML IJ SOLN
INTRAMUSCULAR | Status: AC
  Filled 2020-01-07: qty 1

## 2020-01-07 MED ORDER — LIDOCAINE-EPINEPHRINE 0.5 %-1:200000 IJ SOLN
INTRAMUSCULAR | Status: AC
  Filled 2020-01-07: qty 1

## 2020-01-07 MED ORDER — THROMBIN 20000 UNITS EX SOLR
CUTANEOUS | Status: AC
  Filled 2020-01-07: qty 20000

## 2020-01-07 MED ORDER — LABETALOL HCL 5 MG/ML IV SOLN
INTRAVENOUS | Status: DC | PRN
  Administered 2020-01-07: 10 mg via INTRAVENOUS

## 2020-01-07 MED ORDER — 0.9 % SODIUM CHLORIDE (POUR BTL) OPTIME
TOPICAL | Status: DC | PRN
  Administered 2020-01-07 (×2): 1000 mL

## 2020-01-07 MED ORDER — THROMBIN 20000 UNITS EX SOLR: CUTANEOUS | Status: DC | PRN

## 2020-01-07 MED ORDER — OXYCODONE HCL 5 MG/5ML PO SOLN: 5.0000 mg | Freq: Once | ORAL | Status: DC | PRN

## 2020-01-07 MED ORDER — ROCURONIUM BROMIDE 10 MG/ML (PF) SYRINGE
PREFILLED_SYRINGE | INTRAVENOUS | Status: AC
  Filled 2020-01-07: qty 10

## 2020-01-07 MED ORDER — ALBUTEROL SULFATE (2.5 MG/3ML) 0.083% IN NEBU
INHALATION_SOLUTION | RESPIRATORY_TRACT | Status: AC
  Filled 2020-01-07: qty 3

## 2020-01-07 MED ORDER — CHLORPROMAZINE HCL 25 MG PO TABS
25.0000 mg | ORAL_TABLET | Freq: Every day | ORAL | Status: DC
  Administered 2020-01-07 – 2020-01-09 (×3): 25 mg via ORAL
  Filled 2020-01-07 (×3): qty 1

## 2020-01-07 MED ORDER — NALOXONE HCL 0.4 MG/ML IJ SOLN: 0.0800 mg | INTRAMUSCULAR | Status: DC | PRN

## 2020-01-07 MED ORDER — HYDROMORPHONE HCL 1 MG/ML IJ SOLN
0.2500 mg | INTRAMUSCULAR | Status: DC | PRN
  Administered 2020-01-07 (×2): 0.5 mg via INTRAVENOUS

## 2020-01-07 MED ORDER — VANCOMYCIN HCL IN DEXTROSE 1-5 GM/200ML-% IV SOLN
1000.0000 mg | INTRAVENOUS | Status: AC
  Administered 2020-01-07: 1000 mg via INTRAVENOUS
  Filled 2020-01-07: qty 200

## 2020-01-07 MED ORDER — ACETAMINOPHEN 500 MG PO TABS
1000.0000 mg | ORAL_TABLET | Freq: Once | ORAL | Status: AC
  Administered 2020-01-07: 1000 mg via ORAL
  Filled 2020-01-07: qty 2

## 2020-01-07 MED ORDER — MORPHINE SULFATE (PF) 2 MG/ML IV SOLN
1.0000 mg | INTRAVENOUS | Status: DC | PRN
  Administered 2020-01-07 (×3): 2 mg via INTRAVENOUS
  Filled 2020-01-07 (×3): qty 1

## 2020-01-07 MED ORDER — ONDANSETRON HCL 4 MG/2ML IJ SOLN: 4.0000 mg | INTRAMUSCULAR | Status: DC | PRN

## 2020-01-07 MED ORDER — LOSARTAN POTASSIUM 50 MG PO TABS
50.0000 mg | ORAL_TABLET | Freq: Every day | ORAL | Status: DC
  Administered 2020-01-08 – 2020-01-09 (×2): 50 mg via ORAL
  Filled 2020-01-07 (×2): qty 1

## 2020-01-07 MED ORDER — DEXAMETHASONE SODIUM PHOSPHATE 10 MG/ML IJ SOLN
INTRAMUSCULAR | Status: AC
  Filled 2020-01-07: qty 1

## 2020-01-07 MED ORDER — DEXAMETHASONE 4 MG PO TABS
4.0000 mg | ORAL_TABLET | Freq: Two times a day (BID) | ORAL | Status: DC
  Administered 2020-01-08 – 2020-01-09 (×2): 4 mg via ORAL
  Filled 2020-01-07 (×2): qty 1

## 2020-01-07 MED ORDER — ONDANSETRON HCL 4 MG/2ML IJ SOLN
INTRAMUSCULAR | Status: AC
  Filled 2020-01-07: qty 2

## 2020-01-07 MED ORDER — MIRTAZAPINE 15 MG PO TABS
15.0000 mg | ORAL_TABLET | Freq: Every day | ORAL | Status: DC
  Administered 2020-01-07 – 2020-01-08 (×2): 15 mg via ORAL
  Filled 2020-01-07 (×2): qty 1

## 2020-01-07 MED ORDER — BACITRACIN ZINC 500 UNIT/GM EX OINT
TOPICAL_OINTMENT | CUTANEOUS | Status: AC
  Filled 2020-01-07: qty 28.35

## 2020-01-07 MED ORDER — HEPARIN SODIUM (PORCINE) 5000 UNIT/ML IJ SOLN
5000.0000 [IU] | Freq: Three times a day (TID) | INTRAMUSCULAR | Status: DC
  Administered 2020-01-08 (×2): 5000 [IU] via SUBCUTANEOUS
  Filled 2020-01-07 (×3): qty 1

## 2020-01-07 MED ORDER — METFORMIN HCL 500 MG PO TABS
1000.0000 mg | ORAL_TABLET | Freq: Two times a day (BID) | ORAL | Status: DC
  Administered 2020-01-07 – 2020-01-09 (×4): 1000 mg via ORAL
  Filled 2020-01-07 (×5): qty 2

## 2020-01-07 MED ORDER — IPRATROPIUM-ALBUTEROL 0.5-2.5 (3) MG/3ML IN SOLN
RESPIRATORY_TRACT | Status: AC
  Filled 2020-01-07: qty 3

## 2020-01-07 MED ORDER — PROPOFOL 10 MG/ML IV BOLUS
INTRAVENOUS | Status: AC
  Filled 2020-01-07: qty 20

## 2020-01-07 MED ORDER — LIDOCAINE 2% (20 MG/ML) 5 ML SYRINGE
INTRAMUSCULAR | Status: AC
  Filled 2020-01-07: qty 5

## 2020-01-07 SURGICAL SUPPLY — 66 items
BLADE CLIPPER SURG (BLADE) ×3 IMPLANT
BNDG GAUZE ELAST 4 BULKY (GAUZE/BANDAGES/DRESSINGS) IMPLANT
BUR ACORN 6.0 PRECISION (BURR) ×3 IMPLANT
BUR MATCHSTICK NEURO 3.0 LAGG (BURR) ×1 IMPLANT
BUR SPIRAL ROUTER 2.3 (BUR) ×1 IMPLANT
CANISTER SUCT 3000ML PPV (MISCELLANEOUS) ×3 IMPLANT
CARTRIDGE OIL MAESTRO DRILL (MISCELLANEOUS) ×2 IMPLANT
CATH VENTRIC 35X38 W/TROCAR LG (CATHETERS) IMPLANT
CLIP VESOCCLUDE MED 6/CT (CLIP) IMPLANT
CNTNR URN SCR LID CUP LEK RST (MISCELLANEOUS) ×2 IMPLANT
CONT SPEC 4OZ STRL OR WHT (MISCELLANEOUS) ×3
COVER BURR HOLE 7 (Orthopedic Implant) ×2 IMPLANT
COVER BURR HOLE UNIV 10 (Orthopedic Implant) ×1 IMPLANT
COVER WAND RF STERILE (DRAPES) ×3 IMPLANT
DECANTER SPIKE VIAL GLASS SM (MISCELLANEOUS) ×3 IMPLANT
DIFFUSER DRILL AIR PNEUMATIC (MISCELLANEOUS) ×3 IMPLANT
DRAPE CAMERA VIDEO/LASER (DRAPES) IMPLANT
DRAPE MICROSCOPE LEICA (MISCELLANEOUS) IMPLANT
DRAPE NEUROLOGICAL W/INCISE (DRAPES) ×3 IMPLANT
DRAPE ORTHO SPLIT 77X108 STRL (DRAPES)
DRAPE SURG 17X23 STRL (DRAPES) IMPLANT
DRAPE SURG ORHT 6 SPLT 77X108 (DRAPES) IMPLANT
DRAPE WARM FLUID 44X44 (DRAPES) ×3 IMPLANT
DRSG OPSITE POSTOP 4X8 (GAUZE/BANDAGES/DRESSINGS) ×1 IMPLANT
DRSG TELFA 3X8 NADH (GAUZE/BANDAGES/DRESSINGS) ×3 IMPLANT
DURAPREP 6ML APPLICATOR 50/CS (WOUND CARE) ×3 IMPLANT
ELECT REM PT RETURN 9FT ADLT (ELECTROSURGICAL) ×3
ELECTRODE REM PT RTRN 9FT ADLT (ELECTROSURGICAL) ×2 IMPLANT
EVACUATOR 1/8 PVC DRAIN (DRAIN) IMPLANT
EVACUATOR SILICONE 100CC (DRAIN) IMPLANT
FORCEPS BIPOLAR SPETZLER 8 1.0 (NEUROSURGERY SUPPLIES) ×1 IMPLANT
GAUZE 4X4 16PLY RFD (DISPOSABLE) IMPLANT
GAUZE SPONGE 4X4 12PLY STRL (GAUZE/BANDAGES/DRESSINGS) ×3 IMPLANT
GLOVE ECLIPSE 6.5 STRL STRAW (GLOVE) ×3 IMPLANT
GOWN STRL REUS W/ TWL LRG LVL3 (GOWN DISPOSABLE) ×4 IMPLANT
GOWN STRL REUS W/ TWL XL LVL3 (GOWN DISPOSABLE) IMPLANT
GOWN STRL REUS W/TWL 2XL LVL3 (GOWN DISPOSABLE) IMPLANT
GOWN STRL REUS W/TWL LRG LVL3 (GOWN DISPOSABLE) ×9
GOWN STRL REUS W/TWL XL LVL3 (GOWN DISPOSABLE)
HEMOSTAT SURGICEL 2X14 (HEMOSTASIS) IMPLANT
HOOK DURA 1/2IN (MISCELLANEOUS) ×3 IMPLANT
KIT BASIN OR (CUSTOM PROCEDURE TRAY) ×3 IMPLANT
KIT TURNOVER KIT B (KITS) ×3 IMPLANT
MARKER SPHERE PSV REFLC 13MM (MARKER) ×6 IMPLANT
NDL HYPO 25X1 1.5 SAFETY (NEEDLE) ×2 IMPLANT
NEEDLE HYPO 25X1 1.5 SAFETY (NEEDLE) ×3 IMPLANT
NS IRRIG 1000ML POUR BTL (IV SOLUTION) ×8 IMPLANT
OIL CARTRIDGE MAESTRO DRILL (MISCELLANEOUS) ×3
PACK CRANIOTOMY CUSTOM (CUSTOM PROCEDURE TRAY) ×3 IMPLANT
PAD DRESSING TELFA 3X8 NADH (GAUZE/BANDAGES/DRESSINGS) IMPLANT
PATTIES SURGICAL .25X.25 (GAUZE/BANDAGES/DRESSINGS) IMPLANT
PATTIES SURGICAL .5 X.5 (GAUZE/BANDAGES/DRESSINGS) IMPLANT
PATTIES SURGICAL .5 X3 (DISPOSABLE) IMPLANT
PATTIES SURGICAL 1/4 X 3 (GAUZE/BANDAGES/DRESSINGS) IMPLANT
PATTIES SURGICAL 1X1 (DISPOSABLE) IMPLANT
SCREW UNIII AXS SD 1.5X4 (Screw) ×12 IMPLANT
SPONGE NEURO XRAY DETECT 1X3 (DISPOSABLE) IMPLANT
SPONGE SURGIFOAM ABS GEL 100 (HEMOSTASIS) ×3 IMPLANT
STAPLER VISISTAT 35W (STAPLE) ×3 IMPLANT
SUT NURALON 4 0 TR CR/8 (SUTURE) ×8 IMPLANT
SUT VIC AB 2-0 CT2 18 VCP726D (SUTURE) ×6 IMPLANT
TOWEL GREEN STERILE (TOWEL DISPOSABLE) ×3 IMPLANT
TOWEL GREEN STERILE FF (TOWEL DISPOSABLE) ×3 IMPLANT
TRAY FOLEY MTR SLVR 16FR STAT (SET/KITS/TRAYS/PACK) ×3 IMPLANT
TUBE CONNECTING 12X1/4 (SUCTIONS) ×3 IMPLANT
WATER STERILE IRR 1000ML POUR (IV SOLUTION) ×3 IMPLANT

## 2020-01-07 NOTE — Progress Notes (Signed)
Internal Medicine Clinic Attending  Case discussed with Dr. Basaraba  At the time of the visit.  We reviewed the resident's history and exam and pertinent patient test results.  I agree with the assessment, diagnosis, and plan of care documented in the resident's note.  

## 2020-01-07 NOTE — Progress Notes (Signed)
Belongings on admission: pair of white tennis shoes cell phone  [place] wallet in pants pocket

## 2020-01-07 NOTE — Anesthesia Procedure Notes (Signed)
Procedure Name: Intubation Date/Time: 01/07/2020 8:19 AM Performed by: Leonor Liv, CRNA Pre-anesthesia Checklist: Patient identified, Emergency Drugs available, Suction available, Patient being monitored and Timeout performed Patient Re-evaluated:Patient Re-evaluated prior to induction Oxygen Delivery Method: Circle system utilized Preoxygenation: Pre-oxygenation with 100% oxygen Induction Type: IV induction Ventilation: Mask ventilation without difficulty Grade View: Grade I Tube type: Oral Tube size: 7.5 mm Number of attempts: 1 Placement Confirmation: breath sounds checked- equal and bilateral,  positive ETCO2 and CO2 detector Secured at: 23 cm Tube secured with: Tape Dental Injury: Teeth and Oropharynx as per pre-operative assessment  Difficulty Due To: Difficult Airway- due to dentition Comments: Placed by Hilda Blades, noted multiple loose front teeth preop, as previous assessment after intubation, remained intact

## 2020-01-07 NOTE — Transfer of Care (Signed)
Immediate Anesthesia Transfer of Care Note  Patient: Jared Shea.  Procedure(s) Performed: RIGHT CRANIOTOMY FOR TUMOR RESECTION (Right Head) APPLICATION OF CRANIAL NAVIGATION (N/A )  Patient Location: PACU  Anesthesia Type:General  Level of Consciousness: awake, alert  and oriented  Airway & Oxygen Therapy: Patient Spontanous Breathing and Patient connected to face mask oxygen  Post-op Assessment: Report given to RN, Post -op Vital signs reviewed and stable and Patient moving all extremities X 4  Post vital signs: Reviewed and stable  Last Vitals:  Vitals Value Taken Time  BP 133/83 01/07/20 1027  Temp    Pulse 67 01/07/20 1027  Resp 10 01/07/20 1031  SpO2 99 % 01/07/20 1027  Vitals shown include unvalidated device data.  Last Pain:  Vitals:   01/07/20 1027  TempSrc:   PainSc: (P) 8       Patients Stated Pain Goal: (P) 4 (16/10/96 0454)  Complications: No complications documented.

## 2020-01-07 NOTE — Progress Notes (Signed)
MRI will call RN when machine is available for scan.

## 2020-01-07 NOTE — Anesthesia Postprocedure Evaluation (Signed)
Anesthesia Post Note  Patient: Jared Shea.  Procedure(s) Performed: RIGHT CRANIOTOMY FOR TUMOR RESECTION (Right Head) APPLICATION OF CRANIAL NAVIGATION (N/A )     Patient location during evaluation: PACU Anesthesia Type: General Level of consciousness: awake and alert, oriented and patient cooperative Pain management: pain level controlled Vital Signs Assessment: post-procedure vital signs reviewed and stable Respiratory status: spontaneous breathing, nonlabored ventilation and respiratory function stable Cardiovascular status: blood pressure returned to baseline and stable Postop Assessment: no apparent nausea or vomiting Anesthetic complications: no   No complications documented.  Last Vitals:  Vitals:   01/07/20 1100 01/07/20 1114  BP: (!) 154/103 (!) 152/81  Pulse: 71 71  Resp: 12 10  Temp:  36.7 C  SpO2: 94% 100%    Last Pain:  Vitals:   01/07/20 1100  TempSrc:   PainSc: [place]

## 2020-01-07 NOTE — Anesthesia Procedure Notes (Signed)
Arterial Line Insertion Start/End8/26/2021 7:45 AM, 01/07/2020 8:00 AM Performed by: Leonor Liv, CRNA, CRNA  Patient location: Pre-op. Preanesthetic checklist: patient identified, IV checked, site marked, risks and benefits discussed, surgical consent, monitors and equipment checked, pre-op evaluation and timeout performed Lidocaine 1% used for infiltration and patient sedated radial was placed Catheter size: 20 G Hand hygiene performed , maximum sterile barriers used  and Seldinger technique used Allen's test indicative of satisfactory collateral circulation Attempts: 3 (SRNA attemptX2) Procedure performed without using ultrasound guided technique. Following insertion, dressing applied and Biopatch. Post procedure assessment: normal  Patient tolerated the procedure well with no immediate complications. Additional procedure comments: SRNA Lynne Leader, Trixie Deis CRNA successful attemptX1.

## 2020-01-07 NOTE — Op Note (Signed)
01/07/2020  10:39 AM  PATIENT:  Jared Shea.  64 y.o. 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 was taken to the operating room, intubated, and placed under a general anesthetic without difficulty. He was positioned supine on the operating table. Once adequate anesthesia was obtained,  a three pin head holder was placed without difficulty. I attached the navigation system to the [place] adapter then using the preoperative planning for the craniotomy to register his head with the Brain Lab system. Registration was performed with no difficulty, and deemed satisfactory to proceed by me. His head was shaved, prepped, and draped in a sterile fashion. Using navigation I rechecked my planned incision and verified once more. I opened the incision with a 10 blade dividing the scalp and the temporalis muscle. I placed Raney clips along the scalp edges. I exposed the skull and once more verified my location over the tumor. I created burr holes and connected them to turn a small craniotomy flap. I removed the flap easily. I used navigation to again verify my location over the tumor. I opened the dura with scissors and scalpel. The hump where the tumor was indeed located was easily appreciated. I used bipolar cautery to make a small corticectomy and the tumor was exposed. I worked around the periphery of the tumor with suction. I removed the mass in total  with forceps. We assured hemostasis with irrigation. I placed surgicel in the resection bed.  We then closed the dura. Approximated the skull with plates and screws. We closed the galea with vicryl sutures, and the scalp edges with staples. I applied a sterile dressing. I removed the head holder. Jared Shea was extubated easily and brought to the PACU.  PLAN OF CARE: Admit to inpatient   PATIENT DISPOSITION:  PACU - hemodynamically stable.   Delay start of Pharmacological VTE agent (>24hrs) due to surgical blood loss or risk of bleeding:  no

## 2020-01-07 NOTE — Progress Notes (Signed)
At the beginning of the exam, the pt was ok with contrast and agreed to the exam. Pre contrast imaging was obtained. At the moment to receive contrast the pt began refusing contrast. It was explained by the technologist the importance of contrasted imaging post surgery. The patient still refused contrast and refused further scanning. Exam stopped and pt taken out of scanner.

## 2020-01-07 NOTE — Interval H&P Note (Signed)
History and Physical Interval Note:  01/07/2020 8:06 AM  Jared Shea.  has presented today for surgery, with the diagnosis of BRAIN TUMOR.  The various methods of treatment have been discussed with the patient and family. After consideration of risks, benefits and other options for treatment, the patient has consented to  Procedure(s) with comments: RIGHT CRANIOTOMY FOR TUMOR RESECTION (Right) - rm 21 APPLICATION OF CRANIAL NAVIGATION (N/A) as a surgical intervention.  The patient's history has been reviewed, patient examined, no change in status, stable for surgery.  I have reviewed the patient's chart and labs.  Questions were answered to the patient's satisfaction.     Ashok Pall

## 2020-01-08 ENCOUNTER — Encounter (HOSPITAL_COMMUNITY): Payer: Self-pay | Admitting: Neurosurgery

## 2020-01-08 LAB — GLUCOSE, CAPILLARY
Glucose-Capillary: 429 mg/dL — ABNORMAL HIGH (ref 70–99)
Glucose-Capillary: 262 mg/dL — ABNORMAL HIGH (ref 70–99)

## 2020-01-08 MED ORDER — DEXAMETHASONE 4 MG PO TABS: 4.0000 mg | ORAL_TABLET | Freq: Two times a day (BID) | ORAL | 0 refills | Status: DC

## 2020-01-08 MED ORDER — ACETAMINOPHEN-CODEINE #3 300-30 MG PO TABS: 1.0000 | ORAL_TABLET | Freq: Four times a day (QID) | ORAL | 0 refills | Status: DC | PRN

## 2020-01-08 MED ORDER — ENSURE ENLIVE PO LIQD
237.0000 mL | Freq: Two times a day (BID) | ORAL | Status: DC
  Administered 2020-01-09: 237 mL via ORAL

## 2020-01-08 NOTE — Discharge Summary (Signed)
Physician Discharge Summary  Patient ID: Jared Shea. MRN: [id number] DOB/AGE: 12-12-1955 64 y.o.  Admit date: 01/07/2020 Discharge date: 01/08/2020  Admission Diagnoses:metastatic adenocarcinoma right frontal lobe  Discharge Diagnoses:  Active Problems:   Adenocarcinoma, metastatic [facility])   Discharged Condition: good  Hospital Course: Jared Shea was admitted and taken to the operating room for an uncomplicated craniotomy and tumor resection. Post op Mri showed complete resection. He denied contrast.  He is alert, oriented x 4 with clear and fluent speech. 5/5 strength in all extremities. No drift He is ambulating, voiding, and tolerating a regular diet. Wound is clean, dry, no sign of infection.  Treatments: surgery: right frontal craniotomy for tumor resection  Discharge Exam: Blood pressure 132/64, pulse 69, temperature 97.8 F (36.6 C), temperature source Oral, resp. rate 18, height 6\' 1"  (1.854 m), weight 77.1 kg, SpO2 100 %. General appearance: alert, cooperative, appears stated age and no distress  Disposition: Discharge disposition: 01-Home or Self Care      BRAIN TUMOR  Allergies as of 01/08/2020      Reactions   Penicillins Rash      Medication List    TAKE these medications   acetaminophen-codeine 300-30 MG tablet Commonly known as: TYLENOL #3 Take 1 tablet by mouth every 6 (six) hours as needed for up to 8 days for moderate pain.   chlorproMAZINE 25 MG tablet Commonly known as: THORAZINE Take 1 tablet (25 mg total) by mouth 3 (three) times daily as needed. What changed:   when to take this  additional instructions   dexamethasone 4 MG tablet Commonly known as: DECADRON Take 1 tablet (4 mg total) by mouth 2 (two) times daily. What changed: Another medication with the same name was added. Make sure you understand how and when to take each.   dexamethasone 4 MG tablet Commonly known as: DECADRON Take 1 tablet (4 mg total) by mouth 2 (two) times  daily with a meal. What changed: You were already taking a medication with the same name, and this prescription was added. Make sure you understand how and when to take each.   levETIRAcetam 750 MG tablet Commonly known as: KEPPRA Take 1 tablet (750 mg total) by mouth 2 (two) times daily.   losartan-hydrochlorothiazide 50-12.5 MG tablet Commonly known as: HYZAAR Take 1 tablet by mouth daily.   metFORMIN 500 MG tablet Commonly known as: Glucophage Take 2 tablets (1,000 mg total) by mouth 2 (two) times daily with a meal.   mirtazapine 15 MG tablet Commonly known as: Remeron Take 1 tablet (15 mg total) by mouth at bedtime.   omeprazole 20 MG capsule Commonly known as: PRILOSEC Take 1 capsule (20 mg total) by mouth daily.   prochlorperazine 10 MG tablet Commonly known as: COMPAZINE Take 1 tablet (10 mg total) by mouth every 6 (six) hours as needed.       Follow-up Information    Ashok Pall, MD Follow up in 2 week(s).   Specialty: Neurosurgery Why: please call to make an appointment for staple removal Contact information: [address] [address] [place] [address] [phone]               Signed: Ashok Pall 01/08/2020, 6:55 PM

## 2020-01-08 NOTE — Progress Notes (Signed)
Initial Nutrition Assessment  DOCUMENTATION CODES:   Not applicable  INTERVENTION:   Pt at very high risk of malnutrition due to catabolic illness and recent surgery. Encourage PO intake at all meals and encourage oral nutrition supplements.   Ensure Enlive po BID, each supplement provides 350 kcal and 20 grams of protein  Magic cup TID with meals, each supplement provides 290 kcal and 9 grams of protein  NUTRITION DIAGNOSIS:   Increased nutrient needs related to cancer and cancer related treatments as evidenced by estimated needs.  GOAL:   Patient will meet greater than or equal to 90% of their needs  MONITOR:   PO intake, Supplement acceptance  REASON FOR ASSESSMENT:   Malnutrition Screening Tool    ASSESSMENT:   Pt with PMH of 50 pack year smoking, DM, and newly dx stage 4 NSCLC who is now admitted for R craniotomy for tumor resection (8/26).   Per chart review pt with 6% weight loss x 1 week. Per Pulmonology notes pt with poor appetite.  Pt had surgery yesterday. Diet advanced.    Medications reviewed and include: decadron, glucophage, remeron  Labs reviewed: A1C: 7.6 (8/23) CBG's: 125-132    Diet Order:   Diet Order            Diet Carb Modified Fluid consistency: Thin; Room service appropriate? Yes  Diet effective now                 EDUCATION NEEDS:   Not appropriate for education at this time  Skin:  Skin Assessment: Reviewed RN Assessment  Last BM:  unknown  Height:   Ht Readings from Last 1 Encounters:  01/07/20 6\' 1"  (1.854 m)    Weight:   Wt Readings from Last 1 Encounters:  01/07/20 77.1 kg    Ideal Body Weight:  83.6 kg  BMI:  Body mass index is 22.43 kg/m.  Estimated Nutritional Needs:   Kcal:  2300-2500  Protein:  115-125 grams  Fluid:  >2 L/day  Lockie Pares., RD, LDN, CNSC See AMiON for contact information

## 2020-01-08 NOTE — Progress Notes (Signed)
Patient ID: Jared Shea., male   DOB: [birth year], 64 y.o.   MRN: [id number] Alert and oriented x 4, speech is clear and fluent [place], full eom Symmetric facies Tongue and uvula midline Normal strength Dressing blood stained, dry Discharge tomorrow

## 2020-01-09 ENCOUNTER — Other Ambulatory Visit: Payer: Self-pay | Admitting: Pulmonary Disease

## 2020-01-09 LAB — GLUCOSE, CAPILLARY: Glucose-Capillary: 309 mg/dL — ABNORMAL HIGH (ref 70–99)

## 2020-01-09 NOTE — Progress Notes (Signed)
Only 4 staples securing telfa dressing removed per MD order; incisional staples remain intact; only sm amount of dry blood noted on telfa dressing that was removed; patient tolerated well.  Remaining staples will be addressed in office per Dr. Christella Noa.

## 2020-01-09 NOTE — Progress Notes (Signed)
Patient being discharged home.  Patient to be transported by family.  IV removed with the catheter intact.  Discharge instructions and prescription information given to the patient who verbalized understanding.

## 2020-01-09 NOTE — Plan of Care (Signed)

## 2020-01-09 NOTE — Plan of Care (Signed)
  Problem: Safety: Goal: Ability to remain free from injury will improve 01/09/2020 1006 by Caroll Rancher, RN Outcome: Adequate for Discharge 01/09/2020 0741 by Caroll Rancher, RN Outcome: Progressing   Problem: Education: Goal: Knowledge of General Education information will improve Description: Including pain rating scale, medication(s)/side effects and non-pharmacologic comfort measures 01/09/2020 1006 by Caroll Rancher, RN Outcome: Adequate for Discharge 01/09/2020 0741 by Caroll Rancher, RN Outcome: Progressing

## 2020-01-11 ENCOUNTER — Ambulatory Visit (HOSPITAL_COMMUNITY)
Admission: RE | Admit: 2020-01-11 | Discharge: 2020-01-11 | Disposition: A | Discharge status: Home / Self Care | Payer: Medicare Other | Source: Ambulatory Visit | Attending: Physician Assistant | Admitting: Physician Assistant

## 2020-01-11 ENCOUNTER — Other Ambulatory Visit: Payer: Self-pay

## 2020-01-11 DIAGNOSIS — C3490 Malignant neoplasm of unspecified part of unspecified bronchus or lung: Secondary | ICD-10-CM | POA: Insufficient documentation

## 2020-01-11 LAB — SURGICAL PATHOLOGY

## 2020-01-11 NOTE — Telephone Encounter (Signed)
Pt saw Dr. Silas Flood for a consult 01/06/20. Rx had been prescribed by Dr. Jenetta Downer after bronch.  Dr. Silas Flood, please advise if you are okay refilling med for pt.

## 2020-01-12 ENCOUNTER — Ambulatory Visit: Payer: Medicare Other

## 2020-01-14 ENCOUNTER — Ambulatory Visit (INDEPENDENT_AMBULATORY_CARE_PROVIDER_SITE_OTHER): Payer: Medicare HMO | Admitting: Internal Medicine

## 2020-01-14 ENCOUNTER — Encounter: Payer: Self-pay | Admitting: Internal Medicine

## 2020-01-14 ENCOUNTER — Other Ambulatory Visit: Payer: Self-pay

## 2020-01-14 DIAGNOSIS — E119 Type 2 diabetes mellitus without complications: Secondary | ICD-10-CM | POA: Diagnosis not present

## 2020-01-14 DIAGNOSIS — I10 Essential (primary) hypertension: Secondary | ICD-10-CM

## 2020-01-14 DIAGNOSIS — G40209 Localization-related (focal) (partial) symptomatic epilepsy and epileptic syndromes with complex partial seizures, not intractable, without status epilepticus: Secondary | ICD-10-CM

## 2020-01-14 DIAGNOSIS — C3490 Malignant neoplasm of unspecified part of unspecified bronchus or lung: Secondary | ICD-10-CM

## 2020-01-14 MED ORDER — BLOOD GLUCOSE MONITOR KIT: PACK | 0 refills | Status: DC

## 2020-01-14 MED ORDER — ACETAMINOPHEN-CODEINE #3 300-30 MG PO TABS
1.0000 | ORAL_TABLET | Freq: Four times a day (QID) | ORAL | 0 refills | Status: AC | PRN
Start: 2020-01-14 — End: 2020-01-22

## 2020-01-14 MED ORDER — PROCHLORPERAZINE MALEATE 10 MG PO TABS: 10.0000 mg | ORAL_TABLET | Freq: Four times a day (QID) | ORAL | 2 refills | Status: DC | PRN

## 2020-01-14 NOTE — Assessment & Plan Note (Addendum)
Diabetes mellitus: His A1c was 7.6% on January 04, 2020.  He states his home CBGs are in the 200s but only uses his friends.  Goal A1c is less than 8%.  Plan: -Continue Metformin 1000 mg twice daily. -Given prescription for glucometer kit

## 2020-01-14 NOTE — Patient Instructions (Signed)
Jared Shea,   It was a pleasure taking care of you today.   Please follow up with the neurosurgeon, radiation oncologist and oncologist.  Take care!  Dr. Eileen Stanford  Please call the internal medicine center clinic if you have any questions or concerns, we may be able to help and keep you from a long and expensive emergency room wait. Our clinic and after hours phone number is [phone], the best time to call is Monday through Friday 9 am to 4 pm but there is always someone available 24/7 if you have an emergency. If you need medication refills please notify your pharmacy one week in advance and they will send [place] a request.

## 2020-01-14 NOTE — Assessment & Plan Note (Signed)
>>  ASSESSMENT AND PLAN FOR UNCONTROLLED TYPE 2 DIABETES MELLITUS WITH HYPERGLYCEMIA, WITHOUT LONG-TERM CURRENT USE OF INSULIN  (HCC) WRITTEN ON 01/14/2020  4:55 PM BY AGYEI, OBED K, MD  Diabetes mellitus: His A1c was 7.6% on January 04, 2020.  He states his home CBGs are in the 200s but only uses his friends.  Goal A1c is less than 8%.  Plan: -Continue Metformin  1000 mg twice daily. -Given prescription for glucometer kit

## 2020-01-14 NOTE — Assessment & Plan Note (Signed)
Hypertension: Well-controlled  BP Readings from Last 3 Encounters:  01/14/20 112/68  01/09/20 (!) 117/57  01/06/20 128/80   Plan: -Continue losartan-hydrochlorothiazide 50-12.5 mg daily

## 2020-01-14 NOTE — Assessment & Plan Note (Signed)
Focal seizures: He has not had any subsequent seizures since discharge from the hospital.  Plan: Continue Keppra 750 mg twice daily

## 2020-01-14 NOTE — Progress Notes (Signed)
   CC: Follow-up hypertension  HPI:  [name] is a 64 y.o.-year-old African-American gentleman with medical history significant for diabetes mellitus, hypertension, stage IV adenocarcinoma of the lung with metastasis to the brain here for follow-up.  Please see problem based charting for further details.   Past Medical History:  Diagnosis Date  . Brain mass   . Bursitis of right hip   . Chest pain 08/12/2018  . Chronic cough   . Dependence on nicotine from cigarettes   . Diabetes mellitus without complication ([place])   . Essential hypertension 07/28/2018  . Seizures ([place])   . Viral illness 03/23/2019   Review of Systems:  As per HPI  Physical Exam:  Vitals:   01/14/20 1417  BP: 112/68  Pulse: 75  Temp: 97.6 F (36.4 C)  TempSrc: Oral  SpO2: 97%  Weight: 177 lb 12.8 oz (80.6 kg)  Height: 6' (1.829 m)   Physical Exam HENT:     Head:     Comments: Well-healed surgical incision on the right scalp with staples. Cardiovascular:     Rate and Rhythm: Normal rate.     Heart sounds: No murmur heard.   Pulmonary:     Breath sounds: No rales.  Neurological:     Mental Status: He is alert.     Assessment & Plan:   See Encounters Tab for problem based charting.  Patient discussed with Dr. Dareen Piano

## 2020-01-19 ENCOUNTER — Institutional Professional Consult (permissible substitution): Payer: Medicare Other | Admitting: Pulmonary Disease

## 2020-01-19 NOTE — Progress Notes (Signed)
Internal Medicine Clinic Attending  Case discussed with Dr. Agyei  At the time of the visit.  We reviewed the resident's history and exam and pertinent patient test results.  I agree with the assessment, diagnosis, and plan of care documented in the resident's note.  

## 2020-01-20 ENCOUNTER — Other Ambulatory Visit: Payer: Self-pay

## 2020-01-20 ENCOUNTER — Telehealth: Payer: Self-pay | Admitting: *Deleted

## 2020-01-20 ENCOUNTER — Telehealth: Payer: Self-pay | Admitting: Medical Oncology

## 2020-01-20 ENCOUNTER — Inpatient Hospital Stay: Payer: Medicare HMO | Admitting: Nutrition

## 2020-01-20 ENCOUNTER — Ambulatory Visit
Admission: RE | Admit: 2020-01-20 | Discharge: 2020-01-20 | Disposition: A | Discharge status: Home / Self Care | Payer: Medicare HMO | Source: Ambulatory Visit | Attending: Radiation Oncology | Admitting: Radiation Oncology

## 2020-01-20 ENCOUNTER — Encounter: Payer: Self-pay | Admitting: Internal Medicine

## 2020-01-20 ENCOUNTER — Inpatient Hospital Stay: Payer: Medicare HMO | Attending: Physician Assistant | Admitting: Internal Medicine

## 2020-01-20 VITALS — BP 132/68 | HR 60 | Temp 97.9°F | Resp 18 | Ht 72.0 in | Wt 178.8 lb

## 2020-01-20 DIAGNOSIS — Z79899 Other long term (current) drug therapy: Secondary | ICD-10-CM | POA: Diagnosis not present

## 2020-01-20 DIAGNOSIS — I10 Essential (primary) hypertension: Secondary | ICD-10-CM | POA: Insufficient documentation

## 2020-01-20 DIAGNOSIS — Z7984 Long term (current) use of oral hypoglycemic drugs: Secondary | ICD-10-CM | POA: Diagnosis not present

## 2020-01-20 DIAGNOSIS — E119 Type 2 diabetes mellitus without complications: Secondary | ICD-10-CM | POA: Insufficient documentation

## 2020-01-20 DIAGNOSIS — C3490 Malignant neoplasm of unspecified part of unspecified bronchus or lung: Secondary | ICD-10-CM | POA: Insufficient documentation

## 2020-01-20 DIAGNOSIS — Z510 Encounter for antineoplastic radiation therapy: Secondary | ICD-10-CM | POA: Insufficient documentation

## 2020-01-20 DIAGNOSIS — C7931 Secondary malignant neoplasm of brain: Secondary | ICD-10-CM | POA: Insufficient documentation

## 2020-01-20 DIAGNOSIS — F17210 Nicotine dependence, cigarettes, uncomplicated: Secondary | ICD-10-CM | POA: Diagnosis not present

## 2020-01-20 DIAGNOSIS — E041 Nontoxic single thyroid nodule: Secondary | ICD-10-CM | POA: Diagnosis not present

## 2020-01-20 DIAGNOSIS — Z5111 Encounter for antineoplastic chemotherapy: Secondary | ICD-10-CM | POA: Diagnosis present

## 2020-01-20 DIAGNOSIS — Z7189 Other specified counseling: Secondary | ICD-10-CM

## 2020-01-20 MED ORDER — FLUCONAZOLE 100 MG PO TABS: 100.0000 mg | ORAL_TABLET | Freq: Every day | ORAL | 0 refills | Status: DC

## 2020-01-20 NOTE — Telephone Encounter (Signed)
Disability Pt wants to investigate if he can get disability. " I tried last year and they turned me down"

## 2020-01-20 NOTE — Progress Notes (Signed)
START ON PATHWAY REGIMEN - Non-Small Cell Lung     Administer weekly:     Paclitaxel      Carboplatin   **Always confirm dose/schedule in your pharmacy ordering system**  Patient Characteristics: Preoperative or Nonsurgical Candidate (Clinical Staging), Stage III - Nonsurgical Candidate (Nonsquamous and Squamous), PS = 0, 1 Therapeutic Status: Preoperative or Nonsurgical Candidate (Clinical Staging) AJCC T Category: cTX AJCC N Category: cN2 AJCC M Category: cM0 AJCC 8 Stage Grouping: Unknown ECOG Performance Status: 1 Intent of Therapy: Curative Intent, Discussed with Patient

## 2020-01-20 NOTE — Progress Notes (Signed)
64 year old male diagnosed with lung cancer with brain metastases followed by Dr. Julien Nordmann. Patient will be receiving carbo/Taxol and radiation therapy.  Past medical history includes tobacco, hypertension, seizures, and diabetes.  Medications include Decadron, Glucophage, Remeron, Prilosec, and Compazine.  Labs were reviewed.  Height: 6 feet 1 inch. Weight: 178.8 pounds on August 26. Usual body weight: 180 pounds per patient. BMI: 24.25.  Patient reports nausea but denies vomiting. Weight has improved from 170 pounds on August 26. Patient reports his mouth is sore but his doctor prescribed medication. Patient generally takes blood sugars but they are not fasting.  Sister reports these are around 200. Patient has tried Glucerna but does not really like it.  Nutrition diagnosis: Food and nutrition related knowledge deficit related to lung cancer and associated treatments as evidenced by no prior need for nutrition related information.  Intervention: Educated patient on no concentrated sweets diet.  Stressed importance of limiting concentrated sweets and paring with a meal or a snack that has protein and fat. Educated patient to consume small frequent meals and snacks. Reviewed sources of protein. Encourage medication compliance. Provided fact sheets.  Questions were answered.  Contact information provided.  Monitoring, evaluation, goals: Patient will tolerate adequate calories and protein to minimize weight loss throughout treatment.  Next visit: Monday, September 27 during infusion.  **Disclaimer: This note was dictated with voice recognition software. Similar sounding words can inadvertently be transcribed and this note may contain transcription errors which may not have been corrected upon publication of note.**

## 2020-01-20 NOTE — Telephone Encounter (Signed)
Per Dr. Julien Nordmann, I contacted pathology dept to send recent pathology for [facility] and PDL 1 testing.

## 2020-01-20 NOTE — Progress Notes (Signed)
[place] [phone]   [phone]  OFFICE PROGRESS NOTE  Jean Rosenthal, MD [address] [place] [address]  DIAGNOSIS: stage IV (TX, N2, M1 C) non-small cell lung cancer, adenocarcinoma diagnosed in August 2021 and presented with solitary brain metastasis in addition to mediastinal lymphadenopathy.  PRIOR THERAPY: Status post right craniotomy with tumor resection followed by [facility] to solitary brain metastasis under the care of Dr. Lisbeth Renshaw and Dr. Sherral Hammers.  CURRENT THERAPY: Concurrent chemoradiation with weekly carboplatin for AUC of 2 and paclitaxel 45 mg/M2.  First dose 02/01/2020.  INTERVAL HISTORY: Jared Shea. 64 y.o. male returns to the clinic today for follow-up visit accompanied by his wife.  The patient is feeling fine today with no concerning complaints.  He underwent right craniotomy with tumor resection under the care of Dr. Sherral Hammers on 01/07/2020 and the final pathology was consistent with metastatic adenocarcinoma of the lung.  The patient is recovering well from his surgery.  He denied having any current chest pain, shortness of breath, cough or hemoptysis.  He denied having any fever or chills.  He has no nausea, vomiting, diarrhea or constipation.  He has no headache or visual changes.  The patient has no recent weight loss or night sweats.  He is currently on Decadron 4 mg p.o. twice daily and has been on this for several weeks.  He also has some oral thrush.  He had a PET scan performed recently and he is here today for evaluation and discussion of his treatment options.  MEDICAL HISTORY: Past Medical History:  Diagnosis Date  . Brain mass   . Bursitis of right hip   . Chest pain 08/12/2018  . Chronic cough   . Dependence on nicotine from cigarettes   . Diabetes mellitus without complication ([place])   . Essential hypertension 07/28/2018  . Seizures ([place])   . Viral illness 03/23/2019    ALLERGIES:  is allergic to  penicillins.  MEDICATIONS:  Current Outpatient Medications  Medication Sig Dispense Refill  . acetaminophen-codeine (TYLENOL #3) 300-30 MG tablet Take 1 tablet by mouth every 6 (six) hours as needed for up to 8 days for moderate pain. 30 tablet 0  . blood glucose meter kit and supplies KIT Dispense based on patient and insurance preference. Use up to four times daily as directed. (FOR ICD-9 250.00, 250.01). 1 each 0  . chlorproMAZINE (THORAZINE) 25 MG tablet Take 1 tablet (25 mg total) by mouth 3 (three) times daily as needed. (Patient taking differently: Take 25 mg by mouth See admin instructions. Take 25 mg in the morning, may take a second 25 mg dose in the evening as needed for hiccups) 21 tablet 0  . dexamethasone (DECADRON) 4 MG tablet Take 1 tablet (4 mg total) by mouth 2 (two) times daily. 30 tablet 0  . dexamethasone (DECADRON) 4 MG tablet Take 1 tablet (4 mg total) by mouth 2 (two) times daily with a meal. 6 tablet 0  . levETIRAcetam (KEPPRA) 750 MG tablet Take 1 tablet (750 mg total) by mouth 2 (two) times daily. 180 tablet 4  . losartan-hydrochlorothiazide (HYZAAR) 50-12.5 MG tablet Take 1 tablet by mouth daily. 60 tablet 5  . metFORMIN (GLUCOPHAGE) 500 MG tablet Take 2 tablets (1,000 mg total) by mouth 2 (two) times daily with a meal. 120 tablet 2  . mirtazapine (REMERON) 15 MG tablet Take 1 tablet (15 mg total) by mouth at bedtime. 30 tablet  2  . omeprazole (PRILOSEC) 20 MG capsule Take 1 capsule (20 mg total) by mouth daily. 30 capsule 2  . prochlorperazine (COMPAZINE) 10 MG tablet Take 1 tablet (10 mg total) by mouth every 6 (six) hours as needed. 30 tablet 2   No current facility-administered medications for this visit.    SURGICAL HISTORY:  Past Surgical History:  Procedure Laterality Date  . APPLICATION OF CRANIAL NAVIGATION N/A 01/07/2020   Procedure: APPLICATION OF CRANIAL NAVIGATION;  Surgeon: Ashok Pall, MD;  Location: [place];  Service: Neurosurgery;  Laterality: N/A;   . CRANIOTOMY Right 01/07/2020   Procedure: RIGHT CRANIOTOMY FOR TUMOR RESECTION;  Surgeon: Ashok Pall, MD;  Location: [facility];  Service: Neurosurgery;  Laterality: Right;  rm 21  . ENDOBRONCHIAL ULTRASOUND N/A 12/21/2019   Procedure: ENDOBRONCHIAL ULTRASOUND;  Surgeon: Laurin Coder, MD;  Location: [facility];  Service: Pulmonary;  Laterality: N/A;  . FINE NEEDLE ASPIRATION  12/21/2019   Procedure: FINE NEEDLE ASPIRATION (FNA) LINEAR;  Surgeon: Laurin Coder, MD;  Location: [facility];  Service: Pulmonary;;  . NO PAST SURGERIES    . VIDEO BRONCHOSCOPY N/A 12/21/2019   Procedure: VIDEO BRONCHOSCOPY WITHOUT FLUORO;  Surgeon: Laurin Coder, MD;  Location: [facility];  Service: Pulmonary;  Laterality: N/A;    REVIEW OF SYSTEMS:  Constitutional: positive for fatigue Eyes: negative Ears, nose, mouth, throat, and face: positive for Oral thrush Respiratory: negative Cardiovascular: negative Gastrointestinal: negative Genitourinary:negative Integument/breast: negative Hematologic/lymphatic: negative Musculoskeletal:negative Neurological: negative Behavioral/Psych: negative Endocrine: negative Allergic/Immunologic: negative   PHYSICAL EXAMINATION: General appearance: alert, cooperative, fatigued and no distress Head: Normocephalic, without obvious abnormality, atraumatic Neck: no adenopathy, no JVD, supple, symmetrical, trachea midline and thyroid not enlarged, symmetric, no tenderness/mass/nodules Lymph nodes: Cervical, supraclavicular, and axillary nodes normal. Resp: clear to auscultation bilaterally Back: symmetric, no curvature. ROM normal. No CVA tenderness. Cardio: regular rate and rhythm, S1, S2 normal, no murmur, click, rub or gallop GI: soft, non-tender; bowel sounds normal; no masses,  no organomegaly Extremities: extremities normal, atraumatic, no cyanosis or edema Neurologic: Alert and oriented X 3, normal strength and tone. Normal symmetric reflexes. Normal  coordination and gait  ECOG PERFORMANCE STATUS: 1 - Symptomatic but completely ambulatory  Blood pressure 132/68, pulse 60, temperature 97.9 F (36.6 C), temperature source Tympanic, resp. rate 18, height 6' (1.829 m), weight 178 lb 12.8 oz (81.1 kg), SpO2 100 %.  LABORATORY DATA: Lab Results  Component Value Date   WBC 9.5 01/07/2020   HGB 13.0 01/07/2020   HCT 38.5 (L) 01/07/2020   MCV 91.4 01/07/2020   PLT 352 01/07/2020      Chemistry      Component Value Date/Time   NA 131 (L) 01/04/2020 0856   NA 140 11/23/2019 1516   K 4.6 01/04/2020 0856   CL 96 (L) 01/04/2020 0856   CO2 24 01/04/2020 0856   BUN 33 (H) 01/04/2020 0856   BUN 14 11/23/2019 1516   CREATININE 0.77 01/07/2020 1139   CREATININE 1.08 01/04/2020 0856      Component Value Date/Time   CALCIUM 10.5 (H) 01/04/2020 0856   ALKPHOS 107 01/04/2020 0856   AST 21 01/04/2020 0856   ALT 46 (H) 01/04/2020 0856   BILITOT 1.5 (H) 01/04/2020 0856       RADIOGRAPHIC STUDIES: MR BRAIN WO CONTRAST  Result Date: 01/07/2020 CLINICAL DATA:  Metastatic lung cancer post resection EXAM: MRI HEAD WITHOUT CONTRAST TECHNIQUE: Multiplanar, multiecho pulse sequences of the brain and surrounding structures were obtained without  intravenous contrast. COMPARISON:  12/31/2019 FINDINGS: Patient declined contrast. Brain: New postoperative changes right craniotomy for resection of right frontal metastasis. There is no significant extra-axial collection underlying the craniotomy. A resection cavity encompassing the area of the lesion on the prior study is present containing air, fluid, and blood products. Minor surrounding reduced diffusion likely reflects postoperative contusion. Postcontrast imaging is unavailable. Surrounding edema has slightly decreased. There is decreased mass effect. No infarction or hemorrhage remote from the operative site. No hydrocephalus. Vascular: Major vessel flow voids at the skull base are preserved. Skull and  upper cervical spine: Normal marrow signal is preserved. Sinuses/Orbits: Paranasal sinuses are aerated. Orbits are unremarkable. Other: Sella is unremarkable.  Mastoid air cells are clear. IMPRESSION: Expected postoperative changes post resection of right frontal metastasis. Patient declined contrast but resection appears to encompass the location of the lesion on the preoperative study. Electronically Signed   By: Macy Mis M.D.   On: 01/07/2020 21:03   MR Brain W Wo Contrast  Result Date: 12/31/2019 CLINICAL DATA:  64 year old male with solitary enhancing brain mass at the posterior superior frontal convexity discovered earlier this month. Mediastinal and left hilar lymphadenopathy - with bronchoscopy positive for non-small cell lung carcinoma. Treatment planning. EXAM: MRI HEAD WITHOUT AND WITH CONTRAST TECHNIQUE: Multiplanar, multiecho pulse sequences of the brain and surrounding structures were obtained without and with intravenous contrast. CONTRAST:  61m MULTIHANCE GADOBENATE DIMEGLUMINE 529 MG/ML IV SOLN COMPARISON:  Brain MRI 12/14/2019. FINDINGS: Brain: 12 mm heterogeneously enhancing round mass in the posterior right frontal lobe situated at the margin of the superior and middle frontal gyri is stable in size and configuration from earlier this month. Trace associated blood products. Regional vasogenic edema has mildly regressed. No significant mass effect now. No other brain metastasis identified. No dural thickening. Subtle and somewhat symmetric increased T1 signal in the bilateral internal auditory canals is felt to be artifact (series 11, image 55). Attention is directed on follow-up. No restricted diffusion to suggest acute infarction. No midline shift ventriculomegaly, extra-axial collection or acute intracranial hemorrhage. Cervicomedullary junction and pituitary are within normal limits. Largely normal for age gray and white matter signal outside of the right frontal lobe. No cortical  encephalomalacia. Vascular: Major intracranial vascular flow voids are stable. The major dural venous sinuses are enhancing and appear to be patent. Skull and upper cervical spine: Negative visible cervical spine and spinal cord. Visualized bone marrow signal is within normal limits. Sinuses/Orbits: Stable and negative. Other: Mastoids are clear. Normal noncontrast appearance of the internal auditory structures. Scalp and face appear negative. IMPRESSION: 1. Solitary posterior right frontal lobe brain metastasis, 12 mm and stable since 12/14/2019. Regional vasogenic edema has mildly regressed, with no significant mass effect now. 2. Artifactual increased T1 signal suspected in the internal auditory canals (more so the left). Attention is directed on follow-up. Electronically Signed   By: HGenevie AnnM.D.   On: 12/31/2019 13:37   NM PET Image Initial (PI) Skull Base To Thigh  Result Date: 01/01/2020 CLINICAL DATA:  Initial treatment strategy for non-small cell lung cancer. Solitary brain metastasis. EXAM: NUCLEAR MEDICINE PET SKULL BASE TO THIGH TECHNIQUE: 10.2 mCi F-18 FDG was injected intravenously. Full-ring PET imaging was performed from the skull base to thigh after the radiotracer. CT data was obtained and used for attenuation correction and anatomic localization. Fasting blood glucose: 326 mg/dl COMPARISON:  Chest CT 12/14/2019 FINDINGS: Mediastinal blood pool activity: SUV max 2.63 Liver activity: SUV max 3.34 NECK: Hypermetabolic nodule  in the posterior aspect of the RIGHT lobe of thyroid gland with SUV max equal 4.9. This nodule measures 8 mm on image 52/4). Incidental CT findings: none CHEST: Hypermetabolic subcarinal lymph node with SUV max equal 4.8. Hypermetabolic lymph node posterior to the LEFT mainstem bronchus and extending into the LEFT hilum with SUV max equal 7.0. This node measures approximately 18 mm on image 81/4. There are no hypermetabolic pulmonary nodules. Small 4 mm nodule in the LEFT  upper lobe (image 68/4) is no associated metabolic activity. Incidental CT findings: none ABDOMEN/PELVIS: No abnormal hypermetabolic activity within the liver, pancreas, adrenal glands, or spleen. No hypermetabolic lymph nodes in the abdomen or pelvis. Incidental CT findings: Atherosclerotic calcification of the aorta. Prostate normal SKELETON: No focal hypermetabolic activity to suggest skeletal metastasis. Incidental CT findings: none IMPRESSION: 1. Hypermetabolic subcarinal and LEFT hilar adenopathy consistent metastatic adenopathy. 2. No hypermetabolic pulmonary nodules. 3. Hypermetabolic nodule in the RIGHT lobe of the thyroid gland. This is favored incidental finding representing adenoma however cannot exclude thyroid carcinoma or rare thyroid cancer metastasis. Consider thyroid ultrasound and tissue sampling as clinically warranted. 4. No definitive primary lesion identified. Electronically Signed   By: Suzy Bouchard M.D.   On: 01/01/2020 16:37   US THYROID  Result Date: 01/11/2020 CLINICAL DATA:  Incidental on PET. Hypermetabolic nodule identified in the right thyroid gland on prior PET-CT. Patient has a known history of non-small cell carcinoma of the lung. EXAM: THYROID ULTRASOUND TECHNIQUE: Ultrasound examination of the thyroid gland and adjacent soft tissues was performed. COMPARISON:  None. FINDINGS: Parenchymal Echotexture: Normal Isthmus: 0.4 cm Right lobe: 4.7 x 2.3 x 1.8 cm Left lobe: 3.7 x 1.6 x 1.5 cm _________________________________________________________ Estimated total number of nodules >/= 1 cm: 1 Number of spongiform nodules >/=  2 cm not described below (TR1): 0 Number of mixed cystic and solid nodules >/= 1.5 cm not described below (TR2): 0 _________________________________________________________ Nodule # 1: Location: Right; Mid Maximum size: 1.3 cm; Other 2 dimensions: 1.1 x 1.1 cm Composition: solid/almost completely solid (2) Echogenicity: very hypoechoic (3) Shape: not  taller-than-wide (0) Margins: smooth (0) Echogenic foci: none (0) ACR TI-RADS total points: 5. ACR TI-RADS risk category: TR4 (4-6 points). ACR TI-RADS recommendations: *Given size (>/= 1 - 1.4 cm) and appearance, a follow-up ultrasound in 1 year should be considered based on TI-RADS criteria. _________________________________________________________ IMPRESSION: The hypermetabolic lesion identified on the prior PET-CT corresponds with a 1.3 cm TI-RADS category 4 nodule in the deep aspect of the right mid gland. Hypermetabolic activity on PET is associated with an approximately 40% malignancy rate and supersedes the TI-RADS classification system. Recommend further evaluation with fine-needle aspiration biopsy. The above is in keeping with the ACR TI-RADS recommendations - J Am Coll Radiol 2017;14:587-595. Electronically Signed   By: Jacqulynn Cadet M.D.   On: 01/11/2020 09:41    ASSESSMENT AND PLAN: This is a very pleasant 64 years old African-American male recently diagnosed with a stage IV (TX, N2, M1c) non-small cell lung cancer, adenocarcinoma presented with solitary brain metastasis in addition to right hilar and mediastinal lymphadenopathy diagnosed in August 2021.  The patient is status post right craniotomy with resection of the solitary brain metastasis with SRS. His PET scan showed no evidence of metastatic disease outside the chest. I had a lengthy discussion with the patient and his wife today about his current condition and treatment options. Technically the patient has a stage IV lung cancer but he presented with solitary brain metastasis and  locally advanced disease in the chest. I discussed with the patient consideration of a course of concurrent chemoradiation to the locally advanced disease in the chest and this will be followed by systemic treatment with either immunotherapy or a combination of chemotherapy and immunotherapy in the future. I recommended for the patient concurrent  chemoradiation with weekly carboplatin for AUC of 2 and paclitaxel 45 mg/M2 for 6-7 weeks followed by restaging work-up. I discussed with the patient the adverse effect of this treatment including but not limited to alopecia, myelosuppression, nausea and vomiting, peripheral neuropathy, liver or renal dysfunction. He is expected to start the first cycle of this treatment on February 01, 2020. The patient will have a chemotherapy education class before the first dose of his treatment. I also recommend for the patient to start tapering the Decadron to 4 mg p.o. daily for 1 week followed by 2 mg p.o. daily for 1 week. For the suspicious thyroid nodule, we will refer the patient for fine-needle aspiration of the suspicious thyroid nodule. The patient will come back for follow-up visit with the start of his treatment. For the oral thrush, I will start the patient on Diflucan 100 mg p.o. daily for 10 days. I will also call his pharmacy with prescription for Compazine 10 mg p.o. every 6 hours as needed for nausea. The patient was advised to call immediately if he has any concerning symptoms in the interval. The patient voices understanding of current disease status and treatment options and is in agreement with the current care plan.  All questions were answered. The patient knows to call the clinic with any problems, questions or concerns. We can certainly see the patient much sooner if necessary. The total time spent in the appointment was 50 minutes.  Disclaimer: This note was dictated with voice recognition software. Similar sounding words can inadvertently be transcribed and may not be corrected upon review.

## 2020-01-21 ENCOUNTER — Other Ambulatory Visit: Payer: Self-pay | Admitting: Radiation Therapy

## 2020-01-21 ENCOUNTER — Telehealth: Payer: Self-pay | Admitting: *Deleted

## 2020-01-21 ENCOUNTER — Other Ambulatory Visit: Payer: Self-pay | Admitting: Internal Medicine

## 2020-01-21 ENCOUNTER — Other Ambulatory Visit: Payer: Self-pay | Admitting: *Deleted

## 2020-01-21 DIAGNOSIS — C7931 Secondary malignant neoplasm of brain: Secondary | ICD-10-CM

## 2020-01-21 DIAGNOSIS — C7949 Secondary malignant neoplasm of other parts of nervous system: Secondary | ICD-10-CM

## 2020-01-21 MED ORDER — ACCU-CHEK SOFTCLIX LANCETS MISC: 12 refills | Status: DC

## 2020-01-21 NOTE — Telephone Encounter (Signed)
I called and spoke to the patient.  I explained to him that there is no lipid profile done in the past.  Patient is not sure who started him on the statin.  His last visit with the clinic was 01/14/2020 and he is advised to come back in 3 months.  We will check a lipid panel the next time he comes in and will decide if patient needs to be on a statin.  Patient voiced understanding.

## 2020-01-21 NOTE — Telephone Encounter (Signed)
Received fax from [facility] stating, "We spoke to your patient about diabetes care and noticed your patient has not filled a statin in the last 180 days. Your patient would like [place] to reach out on their behalf to determine if it is appropriate to start a statin therapy. Please send a new prescription for statin therapy if it is appropriate." L. Leatrice Parilla, BSN, RN-BC

## 2020-01-22 ENCOUNTER — Other Ambulatory Visit: Payer: Self-pay

## 2020-01-22 ENCOUNTER — Inpatient Hospital Stay: Payer: Medicare HMO

## 2020-01-22 ENCOUNTER — Encounter: Payer: Self-pay | Admitting: Radiation Oncology

## 2020-01-22 ENCOUNTER — Encounter: Payer: Self-pay | Admitting: Internal Medicine

## 2020-01-22 DIAGNOSIS — C3490 Malignant neoplasm of unspecified part of unspecified bronchus or lung: Secondary | ICD-10-CM | POA: Diagnosis not present

## 2020-01-22 NOTE — Progress Notes (Signed)
Pt has 2 insurances so copay assistance isn't needed.  I emailed Adrienne in the radiation department requesting she reach out to the pt regarding the [employer].

## 2020-01-22 NOTE — Progress Notes (Signed)
Called pt and spoke with him regarding [employer], informed him of information needed to get signed up.

## 2020-01-25 ENCOUNTER — Ambulatory Visit
Admission: RE | Admit: 2020-01-25 | Discharge: 2020-01-25 | Disposition: A | Discharge status: Home / Self Care | Payer: Medicare HMO | Source: Ambulatory Visit | Attending: Radiation Oncology | Admitting: Radiation Oncology

## 2020-01-25 ENCOUNTER — Encounter: Payer: Self-pay | Admitting: *Deleted

## 2020-01-25 ENCOUNTER — Telehealth: Payer: Self-pay | Admitting: Radiation Oncology

## 2020-01-25 ENCOUNTER — Other Ambulatory Visit: Payer: Self-pay

## 2020-01-25 DIAGNOSIS — C3490 Malignant neoplasm of unspecified part of unspecified bronchus or lung: Secondary | ICD-10-CM | POA: Diagnosis not present

## 2020-01-25 NOTE — Progress Notes (Signed)
[place] Work  Clinical Social Work received referral from Therapist, sports for social security disability.  CSW contacted patient at home to offer support and assess for needs.  Patient stated he applied for social security last year but was denied.  CSW explained that once you turn 63 social security disability transfers to social security.  Patient stated he received a letter stating he was denied, but could not remember why.  Patient stated he still wanted to purse SSD regardless of information shared by CSW.  CSW encouraged patient to find the paperwork from social security to determine SSD reason for denial.  Patient plans to find paperwork and share information with CSW tomorrow.     Johnnye Lana, MSW, LCSW, OSW-C Clinical Social Worker [facility] [phone]

## 2020-01-25 NOTE — Telephone Encounter (Signed)
I saw the patient at the treatment machine today due to staff concerns of confusion. The patient has memory difficulties at baseline on top of brain disease recently treated with radiotherapy and surgical resection. I went back to the treatment area and spoke with the patient and reiterated what our plans were for chemoRT. He is receiving this first treatment today. He was in agreement to proceed and I offered to call his daughter Arita Miss as well since she's been actively engaged in his treatment and medical care. I spoke with her today by phone and reiterated our plans to which she is in agreement as well. I asked about his steroid taper from Dr. Cyndy Freeze and she will follow up with her aunt, the patient's sister who accompanied him to his postop visit to make sure he's properly tapering this. I encouraged Shamia to reach out to [place] if she has questions, and reviewed the common side effects of chemoRT to the chest and time frame that most pts will develop side effects and when they should resolve.    Carola Rhine, PAC

## 2020-01-26 ENCOUNTER — Ambulatory Visit
Admission: RE | Admit: 2020-01-26 | Discharge: 2020-01-26 | Disposition: A | Discharge status: Home / Self Care | Payer: Medicare HMO | Source: Ambulatory Visit | Attending: Radiation Oncology | Admitting: Radiation Oncology

## 2020-01-26 ENCOUNTER — Other Ambulatory Visit: Payer: Self-pay

## 2020-01-26 ENCOUNTER — Encounter: Payer: Self-pay | Admitting: Licensed Clinical Social Worker

## 2020-01-26 DIAGNOSIS — C3490 Malignant neoplasm of unspecified part of unspecified bronchus or lung: Secondary | ICD-10-CM | POA: Diagnosis not present

## 2020-01-26 NOTE — Progress Notes (Signed)
Pharmacist Chemotherapy Monitoring - Initial Assessment    Anticipated start date: 02/01/20  Regimen:  . Are orders appropriate based on the patient's diagnosis, regimen, and cycle? Yes . Does the plan date match the patient's scheduled date? Yes . Is the sequencing of drugs appropriate? Yes . Are the premedications appropriate for the patient's regimen? Yes . Prior Authorization for treatment is: Approved o If applicable, is the correct biosimilar selected based on the patient's insurance? not applicable  Organ Function and Labs: Marland Kitchen Are dose adjustments needed based on the patient's renal function, hepatic function, or hematologic function? No . Are appropriate labs ordered prior to the start of patient's treatment? Yes . Other organ system assessment, if indicated: N/A . The following baseline labs, if indicated, have been ordered: N/A  Dose Assessment: . Are the drug doses appropriate? Yes . Are the following correct: o Drug concentrations Yes o IV fluid compatible with drug Yes o Administration routes Yes o Timing of therapy Yes . If applicable, does the patient have documented access for treatment and/or plans for port-a-cath placement? no . If applicable, have lifetime cumulative doses been properly documented and assessed? not applicable Lifetime Dose Tracking  No doses have been documented on this patient for the following tracked chemicals: Doxorubicin, Epirubicin, Idarubicin, Daunorubicin, Mitoxantrone, Bleomycin, Oxaliplatin, Carboplatin, Liposomal Doxorubicin  o   Toxicity Monitoring/Prevention: . The patient has the following take home antiemetics prescribed: Prochlorperazine . The patient has the following take home medications prescribed: N/A . Medication allergies and previous infusion related reactions, if applicable, have been reviewed and addressed. Yes . The patient's current medication list has been assessed for drug-drug interactions with their chemotherapy  regimen. no significant drug-drug interactions were identified on review.  Order Review: . Are the treatment plan orders signed? No . Is the patient scheduled to see a provider prior to their treatment? No  I verify that I have reviewed each item in the above checklist and answered each question accordingly.  Acquanetta Belling 01/26/2020 5:20 PM

## 2020-01-27 ENCOUNTER — Other Ambulatory Visit: Payer: Self-pay | Admitting: Radiation Oncology

## 2020-01-27 ENCOUNTER — Other Ambulatory Visit: Payer: Self-pay

## 2020-01-27 ENCOUNTER — Ambulatory Visit
Admission: RE | Admit: 2020-01-27 | Discharge: 2020-01-27 | Disposition: A | Discharge status: Home / Self Care | Payer: Medicare HMO | Source: Ambulatory Visit | Attending: Radiation Oncology | Admitting: Radiation Oncology

## 2020-01-27 DIAGNOSIS — C3490 Malignant neoplasm of unspecified part of unspecified bronchus or lung: Secondary | ICD-10-CM | POA: Diagnosis not present

## 2020-01-27 MED ORDER — NYSTATIN 100000 UNIT/ML MT SUSP: 5.0000 mL | Freq: Four times a day (QID) | OROMUCOSAL | 0 refills | Status: DC

## 2020-01-27 NOTE — Progress Notes (Signed)
  Radiation Oncology         [phone]) [phone] ________________________________  Name: [name]. MRN: [id number]  Date: 01/05/2020  DOB: 1956-01-03  End of Treatment Note  Diagnosis:   Brain metastasis     Indication for treatment:  palliative       Radiation treatment dates:   01/05/20  Site/dose:   15 Gy in 1 fraction ExacTrac.4 DCA beams max dose=128% PTV1 Rt Frontal 2mm  Narrative: The patient tolerated radiation treatment well.   There were no signs of acute toxicity after treatment.  Plan: The patient has completed radiation treatment. The patient will return to radiation oncology clinic for routine followup in one month. I advised the patient to call or return sooner if they have any questions or concerns related to their recovery or treatment. ________________________________  ------------------------------------------------  Jodelle Gross, MD, PhD

## 2020-01-28 ENCOUNTER — Ambulatory Visit
Admission: RE | Admit: 2020-01-28 | Discharge: 2020-01-28 | Disposition: A | Discharge status: Home / Self Care | Payer: Medicare HMO | Source: Ambulatory Visit | Attending: Radiation Oncology | Admitting: Radiation Oncology

## 2020-01-28 ENCOUNTER — Other Ambulatory Visit: Payer: Self-pay

## 2020-01-28 DIAGNOSIS — C3490 Malignant neoplasm of unspecified part of unspecified bronchus or lung: Secondary | ICD-10-CM | POA: Diagnosis not present

## 2020-01-29 ENCOUNTER — Ambulatory Visit
Admission: RE | Admit: 2020-01-29 | Discharge: 2020-01-29 | Disposition: A | Discharge status: Home / Self Care | Payer: Medicare HMO | Source: Ambulatory Visit | Attending: Radiation Oncology | Admitting: Radiation Oncology

## 2020-01-29 ENCOUNTER — Encounter: Payer: Self-pay | Admitting: *Deleted

## 2020-01-29 ENCOUNTER — Other Ambulatory Visit: Payer: Self-pay

## 2020-01-29 DIAGNOSIS — C3490 Malignant neoplasm of unspecified part of unspecified bronchus or lung: Secondary | ICD-10-CM

## 2020-01-29 MED ORDER — SONAFINE EX EMUL
1.0000 "application " | Freq: Once | CUTANEOUS | Status: AC
  Administered 2020-01-29: 1 via TOPICAL

## 2020-01-29 NOTE — Progress Notes (Signed)

## 2020-01-29 NOTE — Progress Notes (Signed)
I followed up on Mr. Zielke DPL 1.  Results on Dr. Worthy Flank desk.

## 2020-02-01 ENCOUNTER — Inpatient Hospital Stay: Payer: Medicare HMO

## 2020-02-01 ENCOUNTER — Other Ambulatory Visit: Payer: Self-pay | Admitting: Internal Medicine

## 2020-02-01 ENCOUNTER — Other Ambulatory Visit: Payer: Self-pay

## 2020-02-01 ENCOUNTER — Ambulatory Visit
Admission: RE | Admit: 2020-02-01 | Discharge: 2020-02-01 | Disposition: A | Discharge status: Home / Self Care | Payer: Medicare HMO | Source: Ambulatory Visit | Attending: Radiation Oncology | Admitting: Radiation Oncology

## 2020-02-01 VITALS — BP 152/82 | HR 73 | Temp 98.4°F | Resp 20 | Wt 179.2 lb

## 2020-02-01 DIAGNOSIS — C3490 Malignant neoplasm of unspecified part of unspecified bronchus or lung: Secondary | ICD-10-CM | POA: Diagnosis not present

## 2020-02-01 DIAGNOSIS — C799 Secondary malignant neoplasm of unspecified site: Secondary | ICD-10-CM

## 2020-02-01 DIAGNOSIS — Z5111 Encounter for antineoplastic chemotherapy: Secondary | ICD-10-CM | POA: Diagnosis not present

## 2020-02-01 LAB — CBC WITH DIFFERENTIAL (CANCER CENTER ONLY)
Abs Immature Granulocytes: 0.05 10*3/uL (ref 0.00–0.07)
Basophils Absolute: 0 10*3/uL (ref 0.0–0.1)
Basophils Relative: 0 %
Eosinophils Absolute: 0.1 10*3/uL (ref 0.0–0.5)
Eosinophils Relative: 1 %
HCT: 35.1 % — ABNORMAL LOW (ref 39.0–52.0)
Hemoglobin: 12 g/dL — ABNORMAL LOW (ref 13.0–17.0)
Immature Granulocytes: 1 %
Lymphocytes Relative: 19 %
Lymphs Abs: 1.3 10*3/uL (ref 0.7–4.0)
MCH: 31 pg (ref 26.0–34.0)
MCHC: 34.2 g/dL (ref 30.0–36.0)
MCV: 90.7 fL (ref 80.0–100.0)
Monocytes Absolute: 0.5 10*3/uL (ref 0.1–1.0)
Monocytes Relative: 7 %
Neutro Abs: 4.9 10*3/uL (ref 1.7–7.7)
Neutrophils Relative %: 72 %
Platelet Count: 279 10*3/uL (ref 150–400)
RBC: 3.87 MIL/uL — ABNORMAL LOW (ref 4.22–5.81)
RDW: 14.3 % (ref 11.5–15.5)
WBC Count: 6.8 10*3/uL (ref 4.0–10.5)
nRBC: 0 % (ref 0.0–0.2)

## 2020-02-01 LAB — CMP (CANCER CENTER ONLY)
ALT: 44 U/L (ref 0–44)
AST: 28 U/L (ref 15–41)
Albumin: 3.4 g/dL — ABNORMAL LOW (ref 3.5–5.0)
Alkaline Phosphatase: 84 U/L (ref 38–126)
Anion gap: 6 (ref 5–15)
BUN: 11 mg/dL (ref 8–23)
CO2: 25 mmol/L (ref 22–32)
Calcium: 9 mg/dL (ref 8.9–10.3)
Chloride: 107 mmol/L (ref 98–111)
Creatinine: 0.7 mg/dL (ref 0.61–1.24)
GFR, Est AFR Am: >60 mL/min (ref >60)
GFR, Estimated: >60 mL/min (ref >60)
Glucose, Bld: 98 mg/dL (ref 70–99)
Potassium: 4 mmol/L (ref 3.5–5.1)
Sodium: 138 mmol/L (ref 135–145)
Total Bilirubin: 0.6 mg/dL (ref 0.3–1.2)
Total Protein: 7.1 g/dL (ref 6.5–8.1)

## 2020-02-01 MED ORDER — SODIUM CHLORIDE 0.9 % IV SOLN
Freq: Once | INTRAVENOUS | Status: AC
  Filled 2020-02-01: qty 250

## 2020-02-01 MED ORDER — FAMOTIDINE IN NACL 20-0.9 MG/50ML-% IV SOLN
INTRAVENOUS | Status: AC
  Filled 2020-02-01: qty 50

## 2020-02-01 MED ORDER — FAMOTIDINE IN NACL 20-0.9 MG/50ML-% IV SOLN
20.0000 mg | Freq: Once | INTRAVENOUS | Status: AC
  Administered 2020-02-01: 20 mg via INTRAVENOUS

## 2020-02-01 MED ORDER — SODIUM CHLORIDE 0.9 % IV SOLN
20.0000 mg | Freq: Once | INTRAVENOUS | Status: AC
  Administered 2020-02-01: 20 mg via INTRAVENOUS
  Filled 2020-02-01: qty 20

## 2020-02-01 MED ORDER — DIPHENHYDRAMINE HCL 50 MG/ML IJ SOLN
INTRAMUSCULAR | Status: AC
  Filled 2020-02-01: qty 1

## 2020-02-01 MED ORDER — SODIUM CHLORIDE 0.9 % IV SOLN
220.0000 mg | Freq: Once | INTRAVENOUS | Status: AC
  Administered 2020-02-01: 220 mg via INTRAVENOUS
  Filled 2020-02-01: qty 22

## 2020-02-01 MED ORDER — PALONOSETRON HCL INJECTION 0.25 MG/5ML
INTRAVENOUS | Status: AC
  Filled 2020-02-01: qty 5

## 2020-02-01 MED ORDER — PALONOSETRON HCL INJECTION 0.25 MG/5ML
0.2500 mg | Freq: Once | INTRAVENOUS | Status: AC
  Administered 2020-02-01: 0.25 mg via INTRAVENOUS

## 2020-02-01 MED ORDER — DIPHENHYDRAMINE HCL 50 MG/ML IJ SOLN
50.0000 mg | Freq: Once | INTRAMUSCULAR | Status: AC
  Administered 2020-02-01: 50 mg via INTRAVENOUS

## 2020-02-01 MED ORDER — SODIUM CHLORIDE 0.9 % IV SOLN
45.0000 mg/m2 | Freq: Once | INTRAVENOUS | Status: AC
  Administered 2020-02-01: 90 mg via INTRAVENOUS
  Filled 2020-02-01: qty 15

## 2020-02-01 NOTE — Patient Instructions (Signed)
[facility] Discharge Instructions for Patients Receiving Chemotherapy  Today you received the following chemotherapy agents: Paclitaxel and Carboplatin  To help prevent nausea and vomiting after your treatment, we encourage you to take your nausea medication as directed by your MD   If you develop nausea and vomiting that is not controlled by your nausea medication, call the clinic.   BELOW ARE SYMPTOMS THAT SHOULD BE REPORTED IMMEDIATELY:  *FEVER GREATER THAN 100.5 F  *CHILLS WITH OR WITHOUT FEVER  NAUSEA AND VOMITING THAT IS NOT CONTROLLED WITH YOUR NAUSEA MEDICATION  *UNUSUAL SHORTNESS OF BREATH  *UNUSUAL BRUISING OR BLEEDING  TENDERNESS IN MOUTH AND THROAT WITH OR WITHOUT PRESENCE OF ULCERS  *URINARY PROBLEMS  *BOWEL PROBLEMS  UNUSUAL RASH Items with * indicate a potential emergency and should be followed up as soon as possible.  Feel free to call the clinic should you have any questions or concerns. The clinic phone number is [phone].  Please show the [facility] at check-in to the Emergency Department and triage nurse.  Paclitaxel injection What is this medicine? PACLITAXEL (PAK li TAX el) is a chemotherapy drug. It targets fast dividing cells, like cancer cells, and causes these cells to die. This medicine is used to treat ovarian cancer, breast cancer, lung cancer, Kaposi's sarcoma, and other cancers. This medicine may be used for other purposes; ask your health care provider or pharmacist if you have questions. COMMON BRAND NAME(S): Onxol, Taxol What should I tell my health care provider before I take this medicine? They need to know if you have any of these conditions:  history of irregular heartbeat  liver disease  low blood counts, like low white cell, platelet, or red cell counts  lung or breathing disease, like asthma  tingling of the fingers or toes, or other nerve disorder  an unusual or allergic reaction to paclitaxel,  alcohol, polyoxyethylated castor oil, other chemotherapy, other medicines, foods, dyes, or preservatives  pregnant or trying to get pregnant  breast-feeding How should I use this medicine? This drug is given as an infusion into a vein. It is administered in a hospital or clinic by a specially trained health care professional. Talk to your pediatrician regarding the use of this medicine in children. Special care may be needed. Overdosage: If you think you have taken too much of this medicine contact a poison control center or emergency room at once. NOTE: This medicine is only for you. Do not share this medicine with others. What if I miss a dose? It is important not to miss your dose. Call your doctor or health care professional if you are unable to keep an appointment. What may interact with this medicine? Do not take this medicine with any of the following medications:  disulfiram  metronidazole This medicine may also interact with the following medications:  antiviral medicines for hepatitis, HIV or AIDS  certain antibiotics like erythromycin and clarithromycin  certain medicines for fungal infections like ketoconazole and itraconazole  certain medicines for seizures like carbamazepine, phenobarbital, phenytoin  gemfibrozil  nefazodone  rifampin  St. John's wort This list may not describe all possible interactions. Give your health care provider a list of all the medicines, herbs, non-prescription drugs, or dietary supplements you use. Also tell them if you smoke, drink alcohol, or use illegal drugs. Some items may interact with your medicine. What should I watch for while using this medicine? Your condition will be monitored carefully while you are receiving this medicine. You  will need important blood work done while you are taking this medicine. This medicine can cause serious allergic reactions. To reduce your risk you will need to take other medicine(s) before treatment  with this medicine. If you experience allergic reactions like skin rash, itching or hives, swelling of the face, lips, or tongue, tell your doctor or health care professional right away. In some cases, you may be given additional medicines to help with side effects. Follow all directions for their use. This drug may make you feel generally unwell. This is not uncommon, as chemotherapy can affect healthy cells as well as cancer cells. Report any side effects. Continue your course of treatment even though you feel ill unless your doctor tells you to stop. Call your doctor or health care professional for advice if you get a fever, chills or sore throat, or other symptoms of a cold or flu. Do not treat yourself. This drug decreases your body's ability to fight infections. Try to avoid being around people who are sick. This medicine may increase your risk to bruise or bleed. Call your doctor or health care professional if you notice any unusual bleeding. Be careful brushing and flossing your teeth or using a toothpick because you may get an infection or bleed more easily. If you have any dental work done, tell your dentist you are receiving this medicine. Avoid taking products that contain aspirin, acetaminophen, ibuprofen, naproxen, or ketoprofen unless instructed by your doctor. These medicines may hide a fever. Do not become pregnant while taking this medicine. Women should inform their doctor if they wish to become pregnant or think they might be pregnant. There is a potential for serious side effects to an unborn child. Talk to your health care professional or pharmacist for more information. Do not breast-feed an infant while taking this medicine. Men are advised not to father a child while receiving this medicine. This product may contain alcohol. Ask your pharmacist or healthcare provider if this medicine contains alcohol. Be sure to tell all healthcare providers you are taking this medicine. Certain  medicines, like metronidazole and disulfiram, can cause an unpleasant reaction when taken with alcohol. The reaction includes flushing, headache, nausea, vomiting, sweating, and increased thirst. The reaction can last from 30 minutes to several hours. What side effects may I notice from receiving this medicine? Side effects that you should report to your doctor or health care professional as soon as possible:  allergic reactions like skin rash, itching or hives, swelling of the face, lips, or tongue  breathing problems  changes in vision  fast, irregular heartbeat  high or low blood pressure  mouth sores  pain, tingling, numbness in the hands or feet  signs of decreased platelets or bleeding - bruising, pinpoint red spots on the skin, black, tarry stools, blood in the urine  signs of decreased red blood cells - unusually weak or tired, feeling faint or lightheaded, falls  signs of infection - fever or chills, cough, sore throat, pain or difficulty passing urine  signs and symptoms of liver injury like dark yellow or brown urine; general ill feeling or flu-like symptoms; light-colored stools; loss of appetite; nausea; right upper belly pain; unusually weak or tired; yellowing of the eyes or skin  swelling of the ankles, feet, hands  unusually slow heartbeat Side effects that usually do not require medical attention (report to your doctor or health care professional if they continue or are bothersome):  diarrhea  hair loss  loss of appetite  muscle or joint pain  nausea, vomiting  pain, redness, or irritation at site where injected  tiredness This list may not describe all possible side effects. Call your doctor for medical advice about side effects. You may report side effects to FDA at 1-800-FDA-1088. Where should I keep my medicine? This drug is given in a hospital or clinic and will not be stored at home. NOTE: This sheet is a summary. It may not cover all possible  information. If you have questions about this medicine, talk to your doctor, pharmacist, or health care provider.  2020 [employer]/Gold Standard (2017-01-01 13:14:55)  Carboplatin injection What is this medicine? CARBOPLATIN (KAR boe pla tin) is a chemotherapy drug. It targets fast dividing cells, like cancer cells, and causes these cells to die. This medicine is used to treat ovarian cancer and many other cancers. This medicine may be used for other purposes; ask your health care provider or pharmacist if you have questions. COMMON BRAND NAME(S): Paraplatin What should I tell my health care provider before I take this medicine? They need to know if you have any of these conditions:  blood disorders  hearing problems  kidney disease  recent or ongoing radiation therapy  an unusual or allergic reaction to carboplatin, cisplatin, other chemotherapy, other medicines, foods, dyes, or preservatives  pregnant or trying to get pregnant  breast-feeding How should I use this medicine? This drug is usually given as an infusion into a vein. It is administered in a hospital or clinic by a specially trained health care professional. Talk to your pediatrician regarding the use of this medicine in children. Special care may be needed. Overdosage: If you think you have taken too much of this medicine contact a poison control center or emergency room at once. NOTE: This medicine is only for you. Do not share this medicine with others. What if I miss a dose? It is important not to miss a dose. Call your doctor or health care professional if you are unable to keep an appointment. What may interact with this medicine?  medicines for seizures  medicines to increase blood counts like filgrastim, pegfilgrastim, sargramostim  some antibiotics like amikacin, gentamicin, neomycin, streptomycin, tobramycin  vaccines Talk to your doctor or health care professional before taking any of these  medicines:  acetaminophen  aspirin  ibuprofen  ketoprofen  naproxen This list may not describe all possible interactions. Give your health care provider a list of all the medicines, herbs, non-prescription drugs, or dietary supplements you use. Also tell them if you smoke, drink alcohol, or use illegal drugs. Some items may interact with your medicine. What should I watch for while using this medicine? Your condition will be monitored carefully while you are receiving this medicine. You will need important blood work done while you are taking this medicine. This drug may make you feel generally unwell. This is not uncommon, as chemotherapy can affect healthy cells as well as cancer cells. Report any side effects. Continue your course of treatment even though you feel ill unless your doctor tells you to stop. In some cases, you may be given additional medicines to help with side effects. Follow all directions for their use. Call your doctor or health care professional for advice if you get a fever, chills or sore throat, or other symptoms of a cold or flu. Do not treat yourself. This drug decreases your body's ability to fight infections. Try to avoid being around people who are sick. This medicine may increase your  risk to bruise or bleed. Call your doctor or health care professional if you notice any unusual bleeding. Be careful brushing and flossing your teeth or using a toothpick because you may get an infection or bleed more easily. If you have any dental work done, tell your dentist you are receiving this medicine. Avoid taking products that contain aspirin, acetaminophen, ibuprofen, naproxen, or ketoprofen unless instructed by your doctor. These medicines may hide a fever. Do not become pregnant while taking this medicine. Women should inform their doctor if they wish to become pregnant or think they might be pregnant. There is a potential for serious side effects to an unborn child. Talk  to your health care professional or pharmacist for more information. Do not breast-feed an infant while taking this medicine. What side effects may I notice from receiving this medicine? Side effects that you should report to your doctor or health care professional as soon as possible:  allergic reactions like skin rash, itching or hives, swelling of the face, lips, or tongue  signs of infection - fever or chills, cough, sore throat, pain or difficulty passing urine  signs of decreased platelets or bleeding - bruising, pinpoint red spots on the skin, black, tarry stools, nosebleeds  signs of decreased red blood cells - unusually weak or tired, fainting spells, lightheadedness  breathing problems  changes in hearing  changes in vision  chest pain  high blood pressure  low blood counts - This drug may decrease the number of white blood cells, red blood cells and platelets. You may be at increased risk for infections and bleeding.  nausea and vomiting  pain, swelling, redness or irritation at the injection site  pain, tingling, numbness in the hands or feet  problems with balance, talking, walking  trouble passing urine or change in the amount of urine Side effects that usually do not require medical attention (report to your doctor or health care professional if they continue or are bothersome):  hair loss  loss of appetite  metallic taste in the mouth or changes in taste This list may not describe all possible side effects. Call your doctor for medical advice about side effects. You may report side effects to FDA at 1-800-FDA-1088. Where should I keep my medicine? This drug is given in a hospital or clinic and will not be stored at home. NOTE: This sheet is a summary. It may not cover all possible information. If you have questions about this medicine, talk to your doctor, pharmacist, or health care provider.  2020 [employer]/Gold Standard (2007-08-05 14:38:05)

## 2020-02-02 ENCOUNTER — Other Ambulatory Visit: Payer: Self-pay

## 2020-02-02 ENCOUNTER — Ambulatory Visit
Admission: RE | Admit: 2020-02-02 | Discharge: 2020-02-02 | Disposition: A | Discharge status: Home / Self Care | Payer: Medicare HMO | Source: Ambulatory Visit | Attending: Radiation Oncology | Admitting: Radiation Oncology

## 2020-02-02 DIAGNOSIS — C3490 Malignant neoplasm of unspecified part of unspecified bronchus or lung: Secondary | ICD-10-CM | POA: Diagnosis not present

## 2020-02-03 ENCOUNTER — Other Ambulatory Visit: Payer: Self-pay

## 2020-02-03 ENCOUNTER — Ambulatory Visit
Admission: RE | Admit: 2020-02-03 | Discharge: 2020-02-03 | Disposition: A | Discharge status: Home / Self Care | Payer: Medicare HMO | Source: Ambulatory Visit | Attending: Radiation Oncology | Admitting: Radiation Oncology

## 2020-02-03 DIAGNOSIS — C3490 Malignant neoplasm of unspecified part of unspecified bronchus or lung: Secondary | ICD-10-CM | POA: Diagnosis not present

## 2020-02-04 ENCOUNTER — Encounter (HOSPITAL_COMMUNITY): Payer: Self-pay | Admitting: Internal Medicine

## 2020-02-04 ENCOUNTER — Ambulatory Visit
Admission: RE | Admit: 2020-02-04 | Discharge: 2020-02-04 | Disposition: A | Discharge status: Home / Self Care | Payer: Medicare HMO | Source: Ambulatory Visit | Attending: Radiation Oncology | Admitting: Radiation Oncology

## 2020-02-04 ENCOUNTER — Other Ambulatory Visit: Payer: Self-pay

## 2020-02-04 DIAGNOSIS — C3490 Malignant neoplasm of unspecified part of unspecified bronchus or lung: Secondary | ICD-10-CM | POA: Diagnosis not present

## 2020-02-05 ENCOUNTER — Ambulatory Visit
Admission: RE | Admit: 2020-02-05 | Discharge: 2020-02-05 | Disposition: A | Discharge status: Home / Self Care | Payer: Medicare HMO | Source: Ambulatory Visit | Attending: Radiation Oncology | Admitting: Radiation Oncology

## 2020-02-05 ENCOUNTER — Other Ambulatory Visit: Payer: Self-pay

## 2020-02-05 DIAGNOSIS — C3490 Malignant neoplasm of unspecified part of unspecified bronchus or lung: Secondary | ICD-10-CM | POA: Diagnosis not present

## 2020-02-05 NOTE — Progress Notes (Signed)
[place] OFFICE PROGRESS NOTE  Jean Rosenthal, MD [address] [place] [address]  DIAGNOSIS: Stage IV (TX, N2, M1 C) non-small cell lung cancer, adenocarcinoma diagnosed in August 2021 and presented with solitary brain metastasis in addition to mediastinal lymphadenopathy.  PRIOR THERAPY: Status post right craniotomy with tumor resection followed by [facility] to solitary brain metastasis under the care of Dr. Lisbeth Renshaw and Dr. Sherral Hammers.  CURRENT THERAPY: Concurrent chemoradiation with weekly carboplatin for AUC of 2 and paclitaxel 45 mg/M2.  First dose 02/01/2020.  INTERVAL HISTORY: Jared Shea. 64 y.o. male returns to the clinic today for a follow-up visit accompanied by his sister.  The patient is feeling fairly well today without any concerning complaints. The patient is a poor historian the history is somewhat limited. The patient tolerated his first cycle of chemotherapy last week well without any adverse side effects. He denies any recent fever, chills, or night sweats.  He is followed by a member of the nutritionist team for his weight loss. He states his breathing is "alright".  He denies any chest pain, shortness of breath, significant cough, or hemoptysis.  He denies any nausea, vomiting, or diarrhea. Upon further questioning, the patient believes he has not had a bowel movement in a week. He has not tried taking any laxatives or stool softener.  He is currently on a tapering dose of Decadron.  He denies any headaches but states "sometimes" his vision is blurry.  He just completed a prescription of Diflucan for oral thrush. He also states he ran out of pills for his decadron taper and he thinks he finished this. The patient is here today for evaluation before starting week 2 of his treatment.     MEDICAL HISTORY: Past Medical History:  Diagnosis Date  . Brain mass   . Bursitis of right hip   . Chest pain 08/12/2018  . Chronic cough   . Dependence on nicotine  from cigarettes   . Diabetes mellitus without complication ([place])   . Essential hypertension 07/28/2018  . Seizures ([place])   . Viral illness 03/23/2019    ALLERGIES:  is allergic to penicillins.  MEDICATIONS:  Current Outpatient Medications  Medication Sig Dispense Refill  . Accu-Chek Softclix Lancets lancets Use as instructed 100 each 12  . blood glucose meter kit and supplies KIT Dispense based on patient and insurance preference. Use up to four times daily as directed. (FOR ICD-9 250.00, 250.01). 1 each 0  . chlorproMAZINE (THORAZINE) 25 MG tablet Take 1 tablet (25 mg total) by mouth 3 (three) times daily as needed. (Patient taking differently: Take 25 mg by mouth See admin instructions. Take 25 mg in the morning, may take a second 25 mg dose in the evening as needed for hiccups) 21 tablet 0  . dexamethasone (DECADRON) 4 MG tablet Take 1 tablet (4 mg total) by mouth 2 (two) times daily. 30 tablet 0  . dexamethasone (DECADRON) 4 MG tablet Take 1 tablet (4 mg total) by mouth 2 (two) times daily with a meal. 6 tablet 0  . fluconazole (DIFLUCAN) 100 MG tablet Take 1 tablet (100 mg total) by mouth daily. 10 tablet 0  . levETIRAcetam (KEPPRA) 750 MG tablet Take 1 tablet (750 mg total) by mouth 2 (two) times daily. 180 tablet 4  . losartan-hydrochlorothiazide (HYZAAR) 50-12.5 MG tablet Take 1 tablet by mouth daily. 60 tablet 5  . metFORMIN (GLUCOPHAGE) 500 MG tablet Take 2 tablets (1,000 mg total) by  mouth 2 (two) times daily with a meal. 120 tablet 2  . mirtazapine (REMERON) 15 MG tablet Take 1 tablet (15 mg total) by mouth at bedtime. 30 tablet 2  . nystatin (MYCOSTATIN) 100000 UNIT/ML suspension Take 5 mLs (500,000 Units total) by mouth 4 (four) times daily. 60 mL 0  . omeprazole (PRILOSEC) 20 MG capsule Take 1 capsule (20 mg total) by mouth daily. 30 capsule 2  . prochlorperazine (COMPAZINE) 10 MG tablet Take 1 tablet (10 mg total) by mouth every 6 (six) hours as needed. 30 tablet 2   No  current facility-administered medications for this visit.    SURGICAL HISTORY:  Past Surgical History:  Procedure Laterality Date  . APPLICATION OF CRANIAL NAVIGATION N/A 01/07/2020   Procedure: APPLICATION OF CRANIAL NAVIGATION;  Surgeon: Ashok Pall, MD;  Location: [facility];  Service: Neurosurgery;  Laterality: N/A;  . CRANIOTOMY Right 01/07/2020   Procedure: RIGHT CRANIOTOMY FOR TUMOR RESECTION;  Surgeon: Ashok Pall, MD;  Location: [facility];  Service: Neurosurgery;  Laterality: Right;  rm 21  . ENDOBRONCHIAL ULTRASOUND N/A 12/21/2019   Procedure: ENDOBRONCHIAL ULTRASOUND;  Surgeon: Laurin Coder, MD;  Location: [facility];  Service: Pulmonary;  Laterality: N/A;  . FINE NEEDLE ASPIRATION  12/21/2019   Procedure: FINE NEEDLE ASPIRATION (FNA) LINEAR;  Surgeon: Laurin Coder, MD;  Location: [facility];  Service: Pulmonary;;  . NO PAST SURGERIES    . VIDEO BRONCHOSCOPY N/A 12/21/2019   Procedure: VIDEO BRONCHOSCOPY WITHOUT FLUORO;  Surgeon: Laurin Coder, MD;  Location: [facility];  Service: Pulmonary;  Laterality: N/A;    REVIEW OF SYSTEMS:   Review of Systems  Constitutional: Positive for fatigue. Negative for appetite change, chills, fever and unexpected weight change.  HENT:   Negative for mouth sores, nosebleeds, sore throat and trouble swallowing.   Eyes: Positive for bilateral intermittent blurry vision.Negative for eye problems and icterus.  Respiratory: Negative for cough, hemoptysis, shortness of breath and wheezing.   Cardiovascular: Negative for chest pain and leg swelling.  Gastrointestinal: Positive for constipation. Negative for abdominal pain, diarrhea, nausea and vomiting.  Genitourinary: Negative for bladder incontinence, difficulty urinating, dysuria, frequency and hematuria.   Musculoskeletal: Negative for back pain, gait problem, neck pain and neck stiffness.  Skin: Negative for itching and rash.  Neurological: Negative for dizziness, extremity weakness,  gait problem, headaches, light-headedness and seizures.  Hematological: Negative for adenopathy. Does not bruise/bleed easily.  Psychiatric/Behavioral: Negative for confusion, depression and sleep disturbance. The patient is not nervous/anxious.     PHYSICAL EXAMINATION:  There were no vitals taken for this visit.  ECOG PERFORMANCE STATUS: 1 - Symptomatic but completely ambulatory  Physical Exam  Constitutional: Oriented to person, place, and time and well-developed, well-nourished, and in no distress. HENT:  Head: Normocephalic and atraumatic.  Mouth/Throat: Oropharynx is clear and moist. No oropharyngeal exudate.  Eyes: Conjunctivae are normal. Right eye exhibits no discharge. Left eye exhibits no discharge. No scleral icterus.  Neck: Normal range of motion. Neck supple.  Cardiovascular: Normal rate, regular rhythm, normal heart sounds and intact distal pulses.   Pulmonary/Chest: Effort normal and breath sounds normal. No respiratory distress. No wheezes. No rales.  Abdominal: Soft. Bowel sounds are normal. Exhibits no distension and no mass. There is mild tenderness across the lower abdomen.  Musculoskeletal: Normal range of motion. Exhibits no edema.  Lymphadenopathy:    No cervical adenopathy.  Neurological: Alert and oriented to person, place, and time. Exhibits normal muscle tone. Gait normal. Coordination normal.  Skin: Skin is warm and dry. No rash noted. Not diaphoretic. No erythema. No pallor.  Psychiatric: Mood, memory and judgment normal.  Vitals reviewed.  LABORATORY DATA: Lab Results  Component Value Date   WBC 6.8 02/01/2020   HGB 12.0 (L) 02/01/2020   HCT 35.1 (L) 02/01/2020   MCV 90.7 02/01/2020   PLT 279 02/01/2020      Chemistry      Component Value Date/Time   NA 138 02/01/2020 0930   NA 140 11/23/2019 1516   K 4.0 02/01/2020 0930   CL 107 02/01/2020 0930   CO2 25 02/01/2020 0930   BUN 11 02/01/2020 0930   BUN 14 11/23/2019 1516   CREATININE 0.70  02/01/2020 0930      Component Value Date/Time   CALCIUM 9.0 02/01/2020 0930   ALKPHOS 84 02/01/2020 0930   AST 28 02/01/2020 0930   ALT 44 02/01/2020 0930   BILITOT 0.6 02/01/2020 0930       RADIOGRAPHIC STUDIES:  MR BRAIN WO CONTRAST  Result Date: 01/07/2020 CLINICAL DATA:  Metastatic lung cancer post resection EXAM: MRI HEAD WITHOUT CONTRAST TECHNIQUE: Multiplanar, multiecho pulse sequences of the brain and surrounding structures were obtained without intravenous contrast. COMPARISON:  12/31/2019 FINDINGS: Patient declined contrast. Brain: New postoperative changes right craniotomy for resection of right frontal metastasis. There is no significant extra-axial collection underlying the craniotomy. A resection cavity encompassing the area of the lesion on the prior study is present containing air, fluid, and blood products. Minor surrounding reduced diffusion likely reflects postoperative contusion. Postcontrast imaging is unavailable. Surrounding edema has slightly decreased. There is decreased mass effect. No infarction or hemorrhage remote from the operative site. No hydrocephalus. Vascular: Major vessel flow voids at the skull base are preserved. Skull and upper cervical spine: Normal marrow signal is preserved. Sinuses/Orbits: Paranasal sinuses are aerated. Orbits are unremarkable. Other: Sella is unremarkable.  Mastoid air cells are clear. IMPRESSION: Expected postoperative changes post resection of right frontal metastasis. Patient declined contrast but resection appears to encompass the location of the lesion on the preoperative study. Electronically Signed   By: Macy Mis M.D.   On: 01/07/2020 21:03   US THYROID  Result Date: 01/11/2020 CLINICAL DATA:  Incidental on PET. Hypermetabolic nodule identified in the right thyroid gland on prior PET-CT. Patient has a known history of non-small cell carcinoma of the lung. EXAM: THYROID ULTRASOUND TECHNIQUE: Ultrasound examination of the  thyroid gland and adjacent soft tissues was performed. COMPARISON:  None. FINDINGS: Parenchymal Echotexture: Normal Isthmus: 0.4 cm Right lobe: 4.7 x 2.3 x 1.8 cm Left lobe: 3.7 x 1.6 x 1.5 cm _________________________________________________________ Estimated total number of nodules >/= 1 cm: 1 Number of spongiform nodules >/=  2 cm not described below (TR1): 0 Number of mixed cystic and solid nodules >/= 1.5 cm not described below (TR2): 0 _________________________________________________________ Nodule # 1: Location: Right; Mid Maximum size: 1.3 cm; Other 2 dimensions: 1.1 x 1.1 cm Composition: solid/almost completely solid (2) Echogenicity: very hypoechoic (3) Shape: not taller-than-wide (0) Margins: smooth (0) Echogenic foci: none (0) ACR TI-RADS total points: 5. ACR TI-RADS risk category: TR4 (4-6 points). ACR TI-RADS recommendations: *Given size (>/= 1 - 1.4 cm) and appearance, a follow-up ultrasound in 1 year should be considered based on TI-RADS criteria. _________________________________________________________ IMPRESSION: The hypermetabolic lesion identified on the prior PET-CT corresponds with a 1.3 cm TI-RADS category 4 nodule in the deep aspect of the right mid gland. Hypermetabolic activity on PET is associated with an approximately  40% malignancy rate and supersedes the TI-RADS classification system. Recommend further evaluation with fine-needle aspiration biopsy. The above is in keeping with the ACR TI-RADS recommendations - J Am Coll Radiol 2017;14:587-595. Electronically Signed   By: Jacqulynn Cadet M.D.   On: 01/11/2020 09:41     ASSESSMENT/PLAN:  This is a very pleasant 64 year old African-American male diagnosed with stage IV (Tx, N2, M1c) non-small cell lung cancer, adenocarcinoma.  He presented with a solitary brain metastasis in addition to right hilar and mediastinal lymphadenopathy.  He was diagnosed in August 2021.    The patient is status post right craniotomy with resection of  the solitary brain metastasis with SRS under the care of Dr. Lisbeth Renshaw and Dr. Christella Noa.   The patient is currently undergoing weekly concurrent chemoradiation with carboplatin for an AUC of 2 and paclitaxel 45 mg per metered squared.  His first dose of treatment was last week and he tolerated it well.   Labs were reviewed.  Recommend that he proceed with cycle #2 today as scheduled.  We will see him back for follow-up visit in 2 weeks for evaluation before starting cycle #4.  The patient was given instructions with the phone number to reschedule his thyroid nodule biopsy, which has not been scheduled.   I provided information regarding constipation education on his AVS. He was instructed to pick up a laxative on the way home from the clinic today.   The patient was advised to call immediately if he has any concerning symptoms in the interval. The patient voices understanding of current disease status and treatment options and is in agreement with the current care plan. All questions were answered. The patient knows to call the clinic with any problems, questions or concerns. We can certainly see the patient much sooner if necessary     No orders of the defined types were placed in this encounter.    Averie Meiner L Con Arganbright, PA-C 02/05/20

## 2020-02-08 ENCOUNTER — Inpatient Hospital Stay (HOSPITAL_BASED_OUTPATIENT_CLINIC_OR_DEPARTMENT_OTHER): Payer: Medicare HMO | Admitting: Physician Assistant

## 2020-02-08 ENCOUNTER — Other Ambulatory Visit: Payer: Self-pay

## 2020-02-08 ENCOUNTER — Inpatient Hospital Stay: Payer: Medicare HMO

## 2020-02-08 ENCOUNTER — Other Ambulatory Visit: Payer: Self-pay | Admitting: Medical Oncology

## 2020-02-08 ENCOUNTER — Ambulatory Visit
Admission: RE | Admit: 2020-02-08 | Discharge: 2020-02-08 | Disposition: A | Discharge status: Home / Self Care | Payer: Medicare HMO | Source: Ambulatory Visit | Attending: Radiation Oncology | Admitting: Radiation Oncology

## 2020-02-08 ENCOUNTER — Inpatient Hospital Stay: Payer: Medicare HMO | Admitting: Nutrition

## 2020-02-08 VITALS — BP 105/81 | HR 87 | Temp 97.7°F | Resp 18 | Ht 72.0 in | Wt 180.1 lb

## 2020-02-08 VITALS — BP 131/73 | HR 83 | Resp 16

## 2020-02-08 DIAGNOSIS — C3490 Malignant neoplasm of unspecified part of unspecified bronchus or lung: Secondary | ICD-10-CM

## 2020-02-08 DIAGNOSIS — C799 Secondary malignant neoplasm of unspecified site: Secondary | ICD-10-CM

## 2020-02-08 DIAGNOSIS — Z5111 Encounter for antineoplastic chemotherapy: Secondary | ICD-10-CM

## 2020-02-08 DIAGNOSIS — I878 Other specified disorders of veins: Secondary | ICD-10-CM

## 2020-02-08 LAB — CBC WITH DIFFERENTIAL (CANCER CENTER ONLY)
Abs Immature Granulocytes: 0.16 10*3/uL — ABNORMAL HIGH (ref 0.00–0.07)
Basophils Absolute: 0 10*3/uL (ref 0.0–0.1)
Basophils Relative: 0 %
Eosinophils Absolute: 0.1 10*3/uL (ref 0.0–0.5)
Eosinophils Relative: 1 %
HCT: 35.1 % — ABNORMAL LOW (ref 39.0–52.0)
Hemoglobin: 12.2 g/dL — ABNORMAL LOW (ref 13.0–17.0)
Immature Granulocytes: 2 %
Lymphocytes Relative: 19 %
Lymphs Abs: 1.4 10*3/uL (ref 0.7–4.0)
MCH: 31.6 pg (ref 26.0–34.0)
MCHC: 34.8 g/dL (ref 30.0–36.0)
MCV: 90.9 fL (ref 80.0–100.0)
Monocytes Absolute: 0.4 10*3/uL (ref 0.1–1.0)
Monocytes Relative: 6 %
Neutro Abs: 5.1 10*3/uL (ref 1.7–7.7)
Neutrophils Relative %: 72 %
Platelet Count: 417 10*3/uL — ABNORMAL HIGH (ref 150–400)
RBC: 3.86 MIL/uL — ABNORMAL LOW (ref 4.22–5.81)
RDW: 14.5 % (ref 11.5–15.5)
WBC Count: 7.1 10*3/uL (ref 4.0–10.5)
nRBC: 0 % (ref 0.0–0.2)

## 2020-02-08 LAB — CMP (CANCER CENTER ONLY)
ALT: 40 U/L (ref 0–44)
AST: 26 U/L (ref 15–41)
Albumin: 3.5 g/dL (ref 3.5–5.0)
Alkaline Phosphatase: 76 U/L (ref 38–126)
Anion gap: 8 (ref 5–15)
BUN: 17 mg/dL (ref 8–23)
CO2: 24 mmol/L (ref 22–32)
Calcium: 9.3 mg/dL (ref 8.9–10.3)
Chloride: 106 mmol/L (ref 98–111)
Creatinine: 0.74 mg/dL (ref 0.61–1.24)
GFR, Est AFR Am: >60 mL/min (ref >60)
GFR, Estimated: >60 mL/min (ref >60)
Glucose, Bld: 143 mg/dL — ABNORMAL HIGH (ref 70–99)
Potassium: 3.7 mmol/L (ref 3.5–5.1)
Sodium: 138 mmol/L (ref 135–145)
Total Bilirubin: 0.4 mg/dL (ref 0.3–1.2)
Total Protein: 7.1 g/dL (ref 6.5–8.1)

## 2020-02-08 MED ORDER — PALONOSETRON HCL INJECTION 0.25 MG/5ML
0.2500 mg | Freq: Once | INTRAVENOUS | Status: AC
  Administered 2020-02-08: 0.25 mg via INTRAVENOUS

## 2020-02-08 MED ORDER — FAMOTIDINE IN NACL 20-0.9 MG/50ML-% IV SOLN
20.0000 mg | Freq: Once | INTRAVENOUS | Status: AC
  Administered 2020-02-08: 20 mg via INTRAVENOUS

## 2020-02-08 MED ORDER — SODIUM CHLORIDE 0.9 % IV SOLN
216.8000 mg | Freq: Once | INTRAVENOUS | Status: AC
  Administered 2020-02-08: 220 mg via INTRAVENOUS
  Filled 2020-02-08: qty 22

## 2020-02-08 MED ORDER — SODIUM CHLORIDE 0.9 % IV SOLN
Freq: Once | INTRAVENOUS | Status: AC
  Filled 2020-02-08: qty 250

## 2020-02-08 MED ORDER — SODIUM CHLORIDE 0.9 % IV SOLN
20.0000 mg | Freq: Once | INTRAVENOUS | Status: AC
  Administered 2020-02-08: 20 mg via INTRAVENOUS
  Filled 2020-02-08: qty 20

## 2020-02-08 MED ORDER — FAMOTIDINE IN NACL 20-0.9 MG/50ML-% IV SOLN
INTRAVENOUS | Status: AC
  Filled 2020-02-08: qty 50

## 2020-02-08 MED ORDER — FAMOTIDINE IN NACL 20-0.9 MG/50ML-% IV SOLN
20.0000 mg | Freq: Once | INTRAVENOUS | Status: AC | PRN
  Administered 2020-02-08: 20 mg via INTRAVENOUS

## 2020-02-08 MED ORDER — DIPHENHYDRAMINE HCL 50 MG/ML IJ SOLN
50.0000 mg | Freq: Once | INTRAMUSCULAR | Status: AC
  Administered 2020-02-08: 50 mg via INTRAVENOUS

## 2020-02-08 MED ORDER — DIPHENHYDRAMINE HCL 50 MG/ML IJ SOLN
INTRAMUSCULAR | Status: AC
  Filled 2020-02-08: qty 1

## 2020-02-08 MED ORDER — PALONOSETRON HCL INJECTION 0.25 MG/5ML
INTRAVENOUS | Status: AC
  Filled 2020-02-08: qty 5

## 2020-02-08 MED ORDER — SODIUM CHLORIDE 0.9 % IV SOLN
45.0000 mg/m2 | Freq: Once | INTRAVENOUS | Status: AC
  Administered 2020-02-08: 90 mg via INTRAVENOUS
  Filled 2020-02-08: qty 15

## 2020-02-08 NOTE — Progress Notes (Signed)
Pt reported to RN pain with IV to right forearm during Taxol infusion.  RN stopped medication, flushed with NS.  Blood return established, however patient with facial grimacing with fluids.  New IV established to left forearm.  Pt developed cough that he reports as new.  VS obtained via flow sheets.   RN notified Sandi Mealy, PA-C - verbal orders given for Pepcid 20mg  IVPB.  Medication administered @ 1250.  Cough subsided after completion, Taxol restarted at 1306.

## 2020-02-08 NOTE — Patient Instructions (Signed)
   [facility] Discharge Instructions for Patients Receiving Chemotherapy  Today you received the following chemotherapy agents Taxol and Carboplatin   To help prevent nausea and vomiting after your treatment, we encourage you to take your nausea medication as directed.    If you develop nausea and vomiting that is not controlled by your nausea medication, call the clinic.   BELOW ARE SYMPTOMS THAT SHOULD BE REPORTED IMMEDIATELY:  *FEVER GREATER THAN 100.5 F  *CHILLS WITH OR WITHOUT FEVER  NAUSEA AND VOMITING THAT IS NOT CONTROLLED WITH YOUR NAUSEA MEDICATION  *UNUSUAL SHORTNESS OF BREATH  *UNUSUAL BRUISING OR BLEEDING  TENDERNESS IN MOUTH AND THROAT WITH OR WITHOUT PRESENCE OF ULCERS  *URINARY PROBLEMS  *BOWEL PROBLEMS  UNUSUAL RASH Items with * indicate a potential emergency and should be followed up as soon as possible.  Feel free to call the clinic should you have any questions or concerns. The clinic phone number is [phone].  Please show the CHEMO ALERT CARD at check-in to the Emergency Department and triage nurse.   

## 2020-02-08 NOTE — Patient Instructions (Signed)
It is common for patients who are undergoing treatment and taking certain prescribed medications to experience side-effects with constipation.  If you experience constipation, please take stool softener such as Colace or Senna one tablet twice a day everyday to avoid constipation.  These medications are available over the counter.  Of course, if you have diarrhea, stop taking stool softeners.  Drinking plenty of fluid, eating fruits and vegetable, and being active also reduces the risk of constipation.   If despite taking stool softeners, and you still have no bowel movement for 2 days or more than your normal bowel habit frequency, please take one of the following over the counter laxatives:  MiraLax, Milk of Magnesia or Mag Citrate everyday and contact me immediately for further instructions.  The goal is to have at least one bowel movement every other day.   

## 2020-02-08 NOTE — Progress Notes (Signed)
Nutrition follow-up completed with patient receiving treatment for lung cancer with brain metastasis. Weight improved and documented as 180.1 pounds September 22 increased from 178.8 pounds August 26. Noted glucose 143. Patient reports he is eating small amounts of food but often. Denies problems with oral intake. Reports constipation but is not ready to start using a laxative.  Wants to see if this will resolve on its own.  Nutrition diagnosis: Food and nutrition related knowledge deficit improved.  Intervention: Recommended patient continue no concentrated sweets diet.  Recommended small frequent meals and snacks. Encouraged bowel regimen.  Monitoring, evaluation, goals: Patient will tolerate adequate calories and protein to minimize weight loss throughout treatment.  Next visit: Monday, October 11 during infusion.  **Disclaimer: This note was dictated with voice recognition software. Similar sounding words can inadvertently be transcribed and this note may contain transcription errors which may not have been corrected upon publication of note.**

## 2020-02-09 ENCOUNTER — Ambulatory Visit
Admission: RE | Admit: 2020-02-09 | Discharge: 2020-02-09 | Disposition: A | Discharge status: Home / Self Care | Payer: Medicare HMO | Source: Ambulatory Visit | Attending: Radiation Oncology | Admitting: Radiation Oncology

## 2020-02-09 ENCOUNTER — Other Ambulatory Visit: Payer: Self-pay

## 2020-02-09 DIAGNOSIS — C3490 Malignant neoplasm of unspecified part of unspecified bronchus or lung: Secondary | ICD-10-CM | POA: Diagnosis not present

## 2020-02-10 ENCOUNTER — Ambulatory Visit
Admission: RE | Admit: 2020-02-10 | Discharge: 2020-02-10 | Disposition: A | Discharge status: Home / Self Care | Payer: Medicare HMO | Source: Ambulatory Visit | Attending: Radiation Oncology | Admitting: Radiation Oncology

## 2020-02-10 DIAGNOSIS — C3490 Malignant neoplasm of unspecified part of unspecified bronchus or lung: Secondary | ICD-10-CM | POA: Diagnosis not present

## 2020-02-11 ENCOUNTER — Encounter: Payer: Medicare Other | Admitting: Internal Medicine

## 2020-02-11 ENCOUNTER — Other Ambulatory Visit: Payer: Self-pay | Admitting: Internal Medicine

## 2020-02-11 ENCOUNTER — Ambulatory Visit
Admission: RE | Admit: 2020-02-11 | Discharge: 2020-02-11 | Disposition: A | Discharge status: Home / Self Care | Payer: Medicare HMO | Source: Ambulatory Visit | Attending: Radiation Oncology | Admitting: Radiation Oncology

## 2020-02-11 DIAGNOSIS — C3490 Malignant neoplasm of unspecified part of unspecified bronchus or lung: Secondary | ICD-10-CM | POA: Diagnosis not present

## 2020-02-12 ENCOUNTER — Ambulatory Visit
Admission: RE | Admit: 2020-02-12 | Discharge: 2020-02-12 | Disposition: A | Discharge status: Home / Self Care | Payer: Medicare HMO | Source: Ambulatory Visit | Attending: Radiation Oncology | Admitting: Radiation Oncology

## 2020-02-12 DIAGNOSIS — Z510 Encounter for antineoplastic radiation therapy: Secondary | ICD-10-CM | POA: Diagnosis present

## 2020-02-12 DIAGNOSIS — C3490 Malignant neoplasm of unspecified part of unspecified bronchus or lung: Secondary | ICD-10-CM | POA: Insufficient documentation

## 2020-02-12 DIAGNOSIS — C7931 Secondary malignant neoplasm of brain: Secondary | ICD-10-CM | POA: Insufficient documentation

## 2020-02-15 ENCOUNTER — Other Ambulatory Visit: Payer: Self-pay

## 2020-02-15 ENCOUNTER — Inpatient Hospital Stay: Payer: Medicare HMO

## 2020-02-15 ENCOUNTER — Ambulatory Visit
Admission: RE | Admit: 2020-02-15 | Discharge: 2020-02-15 | Disposition: A | Discharge status: Home / Self Care | Payer: Medicare HMO | Source: Ambulatory Visit | Attending: Radiation Oncology | Admitting: Radiation Oncology

## 2020-02-15 ENCOUNTER — Inpatient Hospital Stay: Payer: Medicare HMO | Attending: Physician Assistant

## 2020-02-15 VITALS — BP 124/68 | HR 85 | Temp 98.4°F | Resp 18 | Ht 72.0 in | Wt 176.1 lb

## 2020-02-15 DIAGNOSIS — Z79899 Other long term (current) drug therapy: Secondary | ICD-10-CM | POA: Insufficient documentation

## 2020-02-15 DIAGNOSIS — K5730 Diverticulosis of large intestine without perforation or abscess without bleeding: Secondary | ICD-10-CM | POA: Insufficient documentation

## 2020-02-15 DIAGNOSIS — E119 Type 2 diabetes mellitus without complications: Secondary | ICD-10-CM | POA: Diagnosis not present

## 2020-02-15 DIAGNOSIS — R630 Anorexia: Secondary | ICD-10-CM | POA: Diagnosis not present

## 2020-02-15 DIAGNOSIS — R1310 Dysphagia, unspecified: Secondary | ICD-10-CM | POA: Insufficient documentation

## 2020-02-15 DIAGNOSIS — E876 Hypokalemia: Secondary | ICD-10-CM | POA: Insufficient documentation

## 2020-02-15 DIAGNOSIS — Z510 Encounter for antineoplastic radiation therapy: Secondary | ICD-10-CM | POA: Diagnosis not present

## 2020-02-15 DIAGNOSIS — I10 Essential (primary) hypertension: Secondary | ICD-10-CM | POA: Insufficient documentation

## 2020-02-15 DIAGNOSIS — Z5111 Encounter for antineoplastic chemotherapy: Secondary | ICD-10-CM | POA: Insufficient documentation

## 2020-02-15 DIAGNOSIS — C3490 Malignant neoplasm of unspecified part of unspecified bronchus or lung: Secondary | ICD-10-CM | POA: Insufficient documentation

## 2020-02-15 DIAGNOSIS — Z7984 Long term (current) use of oral hypoglycemic drugs: Secondary | ICD-10-CM | POA: Diagnosis not present

## 2020-02-15 DIAGNOSIS — I878 Other specified disorders of veins: Secondary | ICD-10-CM

## 2020-02-15 DIAGNOSIS — C7931 Secondary malignant neoplasm of brain: Secondary | ICD-10-CM | POA: Insufficient documentation

## 2020-02-15 DIAGNOSIS — C799 Secondary malignant neoplasm of unspecified site: Secondary | ICD-10-CM

## 2020-02-15 DIAGNOSIS — R634 Abnormal weight loss: Secondary | ICD-10-CM | POA: Insufficient documentation

## 2020-02-15 LAB — CBC WITH DIFFERENTIAL (CANCER CENTER ONLY)
Abs Immature Granulocytes: 0.14 10*3/uL — ABNORMAL HIGH (ref 0.00–0.07)
Basophils Absolute: 0 10*3/uL (ref 0.0–0.1)
Basophils Relative: 0 %
Eosinophils Absolute: 0 10*3/uL (ref 0.0–0.5)
Eosinophils Relative: 0 %
HCT: 36.3 % — ABNORMAL LOW (ref 39.0–52.0)
Hemoglobin: 12.7 g/dL — ABNORMAL LOW (ref 13.0–17.0)
Immature Granulocytes: 2 %
Lymphocytes Relative: 13 %
Lymphs Abs: 0.8 10*3/uL (ref 0.7–4.0)
MCH: 31.4 pg (ref 26.0–34.0)
MCHC: 35 g/dL (ref 30.0–36.0)
MCV: 89.9 fL (ref 80.0–100.0)
Monocytes Absolute: 0.6 10*3/uL (ref 0.1–1.0)
Monocytes Relative: 9 %
Neutro Abs: 4.7 10*3/uL (ref 1.7–7.7)
Neutrophils Relative %: 76 %
Platelet Count: 370 10*3/uL (ref 150–400)
RBC: 4.04 MIL/uL — ABNORMAL LOW (ref 4.22–5.81)
RDW: 15.2 % (ref 11.5–15.5)
WBC Count: 6.3 10*3/uL (ref 4.0–10.5)
nRBC: 0 % (ref 0.0–0.2)

## 2020-02-15 LAB — CMP (CANCER CENTER ONLY)
ALT: 50 U/L — ABNORMAL HIGH (ref 0–44)
AST: 32 U/L (ref 15–41)
Albumin: 3.8 g/dL (ref 3.5–5.0)
Alkaline Phosphatase: 77 U/L (ref 38–126)
Anion gap: 7 (ref 5–15)
BUN: 16 mg/dL (ref 8–23)
CO2: 25 mmol/L (ref 22–32)
Calcium: 9.5 mg/dL (ref 8.9–10.3)
Chloride: 105 mmol/L (ref 98–111)
Creatinine: 0.77 mg/dL (ref 0.61–1.24)
GFR, Est AFR Am: >60 mL/min (ref >60)
GFR, Estimated: >60 mL/min (ref >60)
Glucose, Bld: 105 mg/dL — ABNORMAL HIGH (ref 70–99)
Potassium: 4.2 mmol/L (ref 3.5–5.1)
Sodium: 137 mmol/L (ref 135–145)
Total Bilirubin: 0.9 mg/dL (ref 0.3–1.2)
Total Protein: 7.4 g/dL (ref 6.5–8.1)

## 2020-02-15 MED ORDER — SODIUM CHLORIDE 0.9 % IV SOLN
45.0000 mg/m2 | Freq: Once | INTRAVENOUS | Status: AC
  Administered 2020-02-15: 90 mg via INTRAVENOUS
  Filled 2020-02-15: qty 15

## 2020-02-15 MED ORDER — FAMOTIDINE IN NACL 20-0.9 MG/50ML-% IV SOLN
INTRAVENOUS | Status: AC
  Filled 2020-02-15: qty 50

## 2020-02-15 MED ORDER — SODIUM CHLORIDE 0.9 % IV SOLN
20.0000 mg | Freq: Once | INTRAVENOUS | Status: AC
  Administered 2020-02-15: 20 mg via INTRAVENOUS
  Filled 2020-02-15: qty 20

## 2020-02-15 MED ORDER — FAMOTIDINE IN NACL 20-0.9 MG/50ML-% IV SOLN
20.0000 mg | Freq: Once | INTRAVENOUS | Status: AC
  Administered 2020-02-15: 20 mg via INTRAVENOUS

## 2020-02-15 MED ORDER — PALONOSETRON HCL INJECTION 0.25 MG/5ML
INTRAVENOUS | Status: AC
  Filled 2020-02-15: qty 5

## 2020-02-15 MED ORDER — SODIUM CHLORIDE 0.9 % IV SOLN
Freq: Once | INTRAVENOUS | Status: AC
  Filled 2020-02-15: qty 250

## 2020-02-15 MED ORDER — SODIUM CHLORIDE 0.9 % IV SOLN
220.0000 mg | Freq: Once | INTRAVENOUS | Status: AC
  Administered 2020-02-15: 220 mg via INTRAVENOUS
  Filled 2020-02-15: qty 22

## 2020-02-15 MED ORDER — PALONOSETRON HCL INJECTION 0.25 MG/5ML
0.2500 mg | Freq: Once | INTRAVENOUS | Status: AC
  Administered 2020-02-15: 0.25 mg via INTRAVENOUS

## 2020-02-15 MED ORDER — DIPHENHYDRAMINE HCL 50 MG/ML IJ SOLN
50.0000 mg | Freq: Once | INTRAMUSCULAR | Status: AC
  Administered 2020-02-15: 50 mg via INTRAVENOUS

## 2020-02-15 MED ORDER — DIPHENHYDRAMINE HCL 50 MG/ML IJ SOLN
INTRAMUSCULAR | Status: AC
  Filled 2020-02-15: qty 1

## 2020-02-15 NOTE — Patient Instructions (Signed)
[facility] Discharge Instructions for Patients Receiving Chemotherapy  Today you received the following chemotherapy agents Paclitaxel (TAXOL) & Carboplatin (PARAPLATIN).  To help prevent nausea and vomiting after your treatment, we encourage you to take your nausea medication as prescribed.  If you develop nausea and vomiting that is not controlled by your nausea medication, call the clinic.   BELOW ARE SYMPTOMS THAT SHOULD BE REPORTED IMMEDIATELY:  *FEVER GREATER THAN 100.5 F  *CHILLS WITH OR WITHOUT FEVER  NAUSEA AND VOMITING THAT IS NOT CONTROLLED WITH YOUR NAUSEA MEDICATION  *UNUSUAL SHORTNESS OF BREATH  *UNUSUAL BRUISING OR BLEEDING  TENDERNESS IN MOUTH AND THROAT WITH OR WITHOUT PRESENCE OF ULCERS  *URINARY PROBLEMS  *BOWEL PROBLEMS  UNUSUAL RASH Items with * indicate a potential emergency and should be followed up as soon as possible.  Feel free to call the clinic should you have any questions or concerns. The clinic phone number is [phone].  Please show the CHEMO ALERT CARD at check-in to the Emergency Department and triage nurse.   

## 2020-02-16 ENCOUNTER — Other Ambulatory Visit: Payer: Self-pay

## 2020-02-16 ENCOUNTER — Ambulatory Visit
Admission: RE | Admit: 2020-02-16 | Discharge: 2020-02-16 | Disposition: A | Discharge status: Home / Self Care | Payer: Medicare HMO | Source: Ambulatory Visit | Attending: Radiation Oncology | Admitting: Radiation Oncology

## 2020-02-16 DIAGNOSIS — Z510 Encounter for antineoplastic radiation therapy: Secondary | ICD-10-CM | POA: Diagnosis not present

## 2020-02-17 ENCOUNTER — Ambulatory Visit
Admission: RE | Admit: 2020-02-17 | Discharge: 2020-02-17 | Disposition: A | Discharge status: Home / Self Care | Payer: Medicare HMO | Source: Ambulatory Visit | Attending: Radiation Oncology | Admitting: Radiation Oncology

## 2020-02-17 ENCOUNTER — Other Ambulatory Visit: Payer: Self-pay

## 2020-02-17 DIAGNOSIS — Z510 Encounter for antineoplastic radiation therapy: Secondary | ICD-10-CM | POA: Diagnosis not present

## 2020-02-18 ENCOUNTER — Ambulatory Visit
Admission: RE | Admit: 2020-02-18 | Discharge: 2020-02-18 | Disposition: A | Discharge status: Home / Self Care | Payer: Medicare HMO | Source: Ambulatory Visit | Attending: Radiation Oncology | Admitting: Radiation Oncology

## 2020-02-18 DIAGNOSIS — Z510 Encounter for antineoplastic radiation therapy: Secondary | ICD-10-CM | POA: Diagnosis not present

## 2020-02-18 NOTE — Progress Notes (Signed)
[facility] OFFICE PROGRESS NOTE  Jean Rosenthal, MD [address] [address]  DIAGNOSIS: Stage IV (TX, N2, M1 C) non-small cell lung cancer,adenocarcinomadiagnosed in August 2021 and presented with solitary brain metastasis in addition to mediastinal lymphadenopathy.  PDL1 Expression 70%  Molecular Biomarkers: No actionable mutations  PRIOR THERAPY: Status post right craniotomy with tumor resection followed by [facility] to solitary brain metastasis under the care of Dr. Lisbeth Renshaw and Dr. Sherral Hammers.  CURRENT THERAPY: Concurrent chemoradiation with weekly carboplatin for AUC of 2 and paclitaxel 45 mg/M2. First dose 02/01/2020. Status post 3 cycles.   INTERVAL HISTORY: Jared Shea. 64 y.o. male returns to the clinic today for a follow-up visit accompanied by his sister.  The patient is feeling fairly well today without any concerning complaints but he is starting to experience some odynophagia/dysphagia secondary to his radiation treatment. He received a prescription for carafate but has not used it yet. He lost 8 lbs since his last appointment. He is scheduled to meet with a member of the nutritionist team while in the infusion room today. He patient tolerated his 3 cycles of chemotherapy last week well without any adverse side effects. He denies any recent fever, chills, or night sweats. He denies any chest pain except with swallowing, shortness of breath, or hemoptysis. He has a mild cough which may be related to his radiation. His cough is mostly dry. He denies any nausea, vomiting, or diarrhea. The patient reports constipation but he has not tried taking any laxatives or stool softener. He was supposed to schedule a thyroid biopsy but he still has not scheduled it. The patient is here today for evaluation before starting week 4 of his treatment.  MEDICAL HISTORY: Past Medical History:  Diagnosis Date  . Brain mass   . Bursitis of right hip   . Chest pain  08/12/2018  . Chronic cough   . Dependence on nicotine from cigarettes   . Diabetes mellitus without complication ([place])   . Essential hypertension 07/28/2018  . Seizures ([place])   . Viral illness 03/23/2019    ALLERGIES:  is allergic to penicillins.  MEDICATIONS:  Current Outpatient Medications  Medication Sig Dispense Refill  . levETIRAcetam (KEPPRA) 750 MG tablet Take 1 tablet (750 mg total) by mouth 2 (two) times daily. 180 tablet 4  . losartan-hydrochlorothiazide (HYZAAR) 50-12.5 MG tablet Take 1 tablet by mouth daily. 60 tablet 5  . metFORMIN (GLUCOPHAGE) 500 MG tablet Take 2 tablets (1,000 mg total) by mouth 2 (two) times daily with a meal. 120 tablet 2  . prochlorperazine (COMPAZINE) 10 MG tablet Take 1 tablet (10 mg total) by mouth every 6 (six) hours as needed. 30 tablet 2  . Accu-Chek Softclix Lancets lancets Use as instructed (Patient not taking: Reported on 02/22/2020) 100 each 12  . blood glucose meter kit and supplies KIT Dispense based on patient and insurance preference. Use up to four times daily as directed. (FOR ICD-9 250.00, 250.01). (Patient not taking: Reported on 02/22/2020) 1 each 0  . chlorproMAZINE (THORAZINE) 25 MG tablet Take 1 tablet (25 mg total) by mouth 3 (three) times daily as needed. (Patient not taking: Reported on 02/22/2020) 21 tablet 0  . fluconazole (DIFLUCAN) 100 MG tablet Take 1 tablet (100 mg total) by mouth daily. (Patient not taking: Reported on 02/22/2020) 10 tablet 0  . mirtazapine (REMERON) 15 MG tablet Take 1 tablet (15 mg total) by mouth at bedtime. 30 tablet 2  .  nystatin (MYCOSTATIN) 100000 UNIT/ML suspension Take 5 mLs (500,000 Units total) by mouth 4 (four) times daily. (Patient not taking: Reported on 02/22/2020) 60 mL 0  . omeprazole (PRILOSEC) 20 MG capsule Take 1 capsule (20 mg total) by mouth daily. 30 capsule 2  . sucralfate (CARAFATE) 1 g tablet Take 1 tablet (1 g total) by mouth 4 (four) times daily. Dissolve each tablet in 15 cc water  before use. (Patient not taking: Reported on 02/22/2020) 120 tablet 2   No current facility-administered medications for this visit.    SURGICAL HISTORY:  Past Surgical History:  Procedure Laterality Date  . APPLICATION OF CRANIAL NAVIGATION N/A 01/07/2020   Procedure: APPLICATION OF CRANIAL NAVIGATION;  Surgeon: Ashok Pall, MD;  Location: [place];  Service: Neurosurgery;  Laterality: N/A;  . CRANIOTOMY Right 01/07/2020   Procedure: RIGHT CRANIOTOMY FOR TUMOR RESECTION;  Surgeon: Ashok Pall, MD;  Location: [place];  Service: Neurosurgery;  Laterality: Right;  rm 21  . ENDOBRONCHIAL ULTRASOUND N/A 12/21/2019   Procedure: ENDOBRONCHIAL ULTRASOUND;  Surgeon: Laurin Coder, MD;  Location: [facility];  Service: Pulmonary;  Laterality: N/A;  . FINE NEEDLE ASPIRATION  12/21/2019   Procedure: FINE NEEDLE ASPIRATION (FNA) LINEAR;  Surgeon: Laurin Coder, MD;  Location: [facility];  Service: Pulmonary;;  . NO PAST SURGERIES    . VIDEO BRONCHOSCOPY N/A 12/21/2019   Procedure: VIDEO BRONCHOSCOPY WITHOUT FLUORO;  Surgeon: Laurin Coder, MD;  Location: [facility];  Service: Pulmonary;  Laterality: N/A;    REVIEW OF SYSTEMS:   Review of Systems  Constitutional: Positive for appetite change, weight loss, and fatigue. Negative for chills, fever. HENT: Positive for odynophagia and dysphagia. Negative for mouth sores or nosebleeds. Eyes: Negative for eye problems and icterus.  Respiratory: Positive for mild dry cough. Negative for cough, hemoptysis, shortness of breath and wheezing.   Cardiovascular: Negative for chest pain and leg swelling.  Gastrointestinal: Positive for frequent constipation. Negative for abdominal pain, diarrhea, nausea and vomiting.  Genitourinary: Negative for bladder incontinence, difficulty urinating, dysuria, frequency and hematuria.   Musculoskeletal: Negative for back pain, gait problem, neck pain and neck stiffness.  Skin: Negative for itching and rash.   Neurological: Negative for dizziness, extremity weakness, gait problem, headaches, light-headedness and seizures.  Hematological: Negative for adenopathy. Does not bruise/bleed easily.  Psychiatric/Behavioral: Negative for confusion, depression and sleep disturbance. The patient is not nervous/anxious.     PHYSICAL EXAMINATION:  Blood pressure 103/79, pulse 99, temperature 97.8 F (36.6 C), temperature source Tympanic, resp. rate 18, height 6' (1.829 m), weight 172 lb 4.8 oz (78.2 kg), SpO2 98 %.  ECOG PERFORMANCE STATUS: 1 - Symptomatic but completely ambulatory  Physical Exam  Constitutional: Oriented to person, place, and time and well-developed, well-nourished, and in no distress. HENT:  Head: Normocephalic and atraumatic.  Mouth/Throat: Oropharynx is clear and moist. No oropharyngeal exudate.  Eyes: Conjunctivae are normal. Right eye exhibits no discharge. Left eye exhibits no discharge. No scleral icterus.  Neck: Normal range of motion. Neck supple.  Cardiovascular: Normal rate, regular rhythm, normal heart sounds and intact distal pulses.   Pulmonary/Chest: Effort normal and breath sounds normal. No respiratory distress. No wheezes. No rales.  Abdominal: Soft. Bowel sounds are normal. Exhibits no distension and no mass. There is no tenderness.  Musculoskeletal: Normal range of motion. Exhibits no edema.  Lymphadenopathy:    No cervical adenopathy.  Neurological: Alert and oriented to person, place, and time. Exhibits normal muscle tone. Gait normal. Coordination normal.  Skin: Skin is warm and dry. No rash noted. Not diaphoretic. No erythema. No pallor.  Psychiatric: Mood, memory and judgment normal.  Vitals reviewed.  LABORATORY DATA: Lab Results  Component Value Date   WBC 5.4 02/22/2020   HGB 13.3 02/22/2020   HCT 37.0 (L) 02/22/2020   MCV 89.2 02/22/2020   PLT 298 02/22/2020      Chemistry      Component Value Date/Time   NA 140 02/22/2020 0854   NA 140  11/23/2019 1516   K 3.5 02/22/2020 0854   CL 105 02/22/2020 0854   CO2 27 02/22/2020 0854   BUN 11 02/22/2020 0854   BUN 14 11/23/2019 1516   CREATININE 0.79 02/22/2020 0854      Component Value Date/Time   CALCIUM 9.8 02/22/2020 0854   ALKPHOS 80 02/22/2020 0854   AST 27 02/22/2020 0854   ALT 41 02/22/2020 0854   BILITOT 1.1 02/22/2020 0854       RADIOGRAPHIC STUDIES:  No results found.   ASSESSMENT/PLAN:  This is a very pleasant 64 year old African-American male diagnosed with stage IV (Tx, N2, M1c) non-small cell lung cancer, adenocarcinoma.  He presented with a solitary brain metastasis in addition to right hilar and mediastinal lymphadenopathy.  He was diagnosed in August 2021.    The patient is status post right craniotomy with resection of the solitary brain metastasis with SRS under the care of Dr. Lisbeth Renshaw and Dr. Christella Noa.   The patient is currently undergoing weekly concurrent chemoradiation with carboplatin for an AUC of 2 and paclitaxel 45 mg per metered squared. He is status post 3 cycles and tolerated it well except for some dysphagia/odynophagia secondary to radiation.    Labs were reviewed. Recommend that he proceed with cycle #4 today as scheduled.   We will see him back for a follow up visit in 2 weeks for evaluation before starting cycle #6.   The patient was given instructions with the phone number to reschedule his thyroid nodule biopsy, which has not been scheduled.   I have refilled the patient's Remeron for his decreased appetite.  I also strongly encouraged him to use Carafate to help with the pain with swallowing.  He is scheduled to meet with a member of the nutritionist team today regarding recommendations to maintain his weight.  I have also sent in a refill of his Prilosec.  I discussed constipation education with the patient.  I encouraged him to start taking stool softeners and that the goals have a bowel movement every day/every other day.  The  patient was advised to call immediately if he has any concerning symptoms in the interval. The patient voices understanding of current disease status and treatment options and is in agreement with the current care plan. All questions were answered. The patient knows to call the clinic with any problems, questions or concerns. We can certainly see the patient much sooner if necessary     No orders of the defined types were placed in this encounter.    Iliany Losier L Jarielys Girardot, PA-C 02/22/20

## 2020-02-19 ENCOUNTER — Other Ambulatory Visit: Payer: Self-pay | Admitting: Radiation Oncology

## 2020-02-19 ENCOUNTER — Other Ambulatory Visit: Payer: Self-pay

## 2020-02-19 ENCOUNTER — Ambulatory Visit
Admission: RE | Admit: 2020-02-19 | Discharge: 2020-02-19 | Disposition: A | Discharge status: Home / Self Care | Payer: Medicare HMO | Source: Ambulatory Visit | Attending: Radiation Oncology | Admitting: Radiation Oncology

## 2020-02-19 ENCOUNTER — Encounter: Payer: Self-pay | Admitting: *Deleted

## 2020-02-19 DIAGNOSIS — Z510 Encounter for antineoplastic radiation therapy: Secondary | ICD-10-CM | POA: Diagnosis not present

## 2020-02-19 MED ORDER — SUCRALFATE 1 G PO TABS: 1.0000 g | ORAL_TABLET | Freq: Four times a day (QID) | ORAL | 2 refills | Status: DC

## 2020-02-19 NOTE — Progress Notes (Signed)
I followed up on [facility] report.  I printed and will place on Dr. Worthy Flank desk.

## 2020-02-22 ENCOUNTER — Inpatient Hospital Stay: Payer: Medicare HMO | Admitting: Nutrition

## 2020-02-22 ENCOUNTER — Ambulatory Visit
Admission: RE | Admit: 2020-02-22 | Discharge: 2020-02-22 | Disposition: A | Discharge status: Home / Self Care | Payer: Medicare HMO | Source: Ambulatory Visit | Attending: Radiation Oncology | Admitting: Radiation Oncology

## 2020-02-22 ENCOUNTER — Other Ambulatory Visit: Payer: Self-pay | Admitting: Physician Assistant

## 2020-02-22 ENCOUNTER — Inpatient Hospital Stay: Payer: Medicare HMO

## 2020-02-22 ENCOUNTER — Other Ambulatory Visit: Payer: Self-pay

## 2020-02-22 ENCOUNTER — Inpatient Hospital Stay (HOSPITAL_BASED_OUTPATIENT_CLINIC_OR_DEPARTMENT_OTHER): Payer: Medicare HMO | Admitting: Physician Assistant

## 2020-02-22 VITALS — BP 103/79 | HR 99 | Temp 97.8°F | Resp 18 | Ht 72.0 in | Wt 172.3 lb

## 2020-02-22 DIAGNOSIS — G4700 Insomnia, unspecified: Secondary | ICD-10-CM

## 2020-02-22 DIAGNOSIS — C3490 Malignant neoplasm of unspecified part of unspecified bronchus or lung: Secondary | ICD-10-CM

## 2020-02-22 DIAGNOSIS — C799 Secondary malignant neoplasm of unspecified site: Secondary | ICD-10-CM

## 2020-02-22 DIAGNOSIS — C7931 Secondary malignant neoplasm of brain: Secondary | ICD-10-CM | POA: Diagnosis not present

## 2020-02-22 DIAGNOSIS — Z510 Encounter for antineoplastic radiation therapy: Secondary | ICD-10-CM | POA: Diagnosis not present

## 2020-02-22 DIAGNOSIS — Z5111 Encounter for antineoplastic chemotherapy: Secondary | ICD-10-CM | POA: Diagnosis not present

## 2020-02-22 LAB — CBC WITH DIFFERENTIAL (CANCER CENTER ONLY)
Abs Immature Granulocytes: 0.04 10*3/uL (ref 0.00–0.07)
Basophils Absolute: 0 10*3/uL (ref 0.0–0.1)
Basophils Relative: 0 %
Eosinophils Absolute: 0 10*3/uL (ref 0.0–0.5)
Eosinophils Relative: 0 %
HCT: 37 % — ABNORMAL LOW (ref 39.0–52.0)
Hemoglobin: 13.3 g/dL (ref 13.0–17.0)
Immature Granulocytes: 1 %
Lymphocytes Relative: 12 %
Lymphs Abs: 0.6 10*3/uL — ABNORMAL LOW (ref 0.7–4.0)
MCH: 32 pg (ref 26.0–34.0)
MCHC: 35.9 g/dL (ref 30.0–36.0)
MCV: 89.2 fL (ref 80.0–100.0)
Monocytes Absolute: 0.4 10*3/uL (ref 0.1–1.0)
Monocytes Relative: 8 %
Neutro Abs: 4.3 10*3/uL (ref 1.7–7.7)
Neutrophils Relative %: 79 %
Platelet Count: 298 10*3/uL (ref 150–400)
RBC: 4.15 MIL/uL — ABNORMAL LOW (ref 4.22–5.81)
RDW: 15.6 % — ABNORMAL HIGH (ref 11.5–15.5)
WBC Count: 5.4 10*3/uL (ref 4.0–10.5)
nRBC: 0 % (ref 0.0–0.2)

## 2020-02-22 LAB — CMP (CANCER CENTER ONLY)
ALT: 41 U/L (ref 0–44)
AST: 27 U/L (ref 15–41)
Albumin: 4 g/dL (ref 3.5–5.0)
Alkaline Phosphatase: 80 U/L (ref 38–126)
Anion gap: 8 (ref 5–15)
BUN: 11 mg/dL (ref 8–23)
CO2: 27 mmol/L (ref 22–32)
Calcium: 9.8 mg/dL (ref 8.9–10.3)
Chloride: 105 mmol/L (ref 98–111)
Creatinine: 0.79 mg/dL (ref 0.61–1.24)
GFR, Estimated: >60 mL/min (ref >60)
Glucose, Bld: 127 mg/dL — ABNORMAL HIGH (ref 70–99)
Potassium: 3.5 mmol/L (ref 3.5–5.1)
Sodium: 140 mmol/L (ref 135–145)
Total Bilirubin: 1.1 mg/dL (ref 0.3–1.2)
Total Protein: 7.6 g/dL (ref 6.5–8.1)

## 2020-02-22 MED ORDER — LIDOCAINE-PRILOCAINE 2.5-2.5 % EX CREA: 1.0000 "application " | TOPICAL_CREAM | CUTANEOUS | 0 refills | Status: DC | PRN

## 2020-02-22 MED ORDER — PALONOSETRON HCL INJECTION 0.25 MG/5ML
0.2500 mg | Freq: Once | INTRAVENOUS | Status: AC
  Administered 2020-02-22: 0.25 mg via INTRAVENOUS

## 2020-02-22 MED ORDER — MIRTAZAPINE 15 MG PO TABS: 15.0000 mg | ORAL_TABLET | Freq: Every day | ORAL | 2 refills | Status: DC

## 2020-02-22 MED ORDER — PALONOSETRON HCL INJECTION 0.25 MG/5ML
INTRAVENOUS | Status: AC
  Filled 2020-02-22: qty 5

## 2020-02-22 MED ORDER — OMEPRAZOLE 20 MG PO CPDR: 20.0000 mg | DELAYED_RELEASE_CAPSULE | Freq: Every day | ORAL | 2 refills | Status: DC

## 2020-02-22 MED ORDER — FAMOTIDINE IN NACL 20-0.9 MG/50ML-% IV SOLN
INTRAVENOUS | Status: AC
  Filled 2020-02-22: qty 50

## 2020-02-22 MED ORDER — SODIUM CHLORIDE 0.9 % IV SOLN
Freq: Once | INTRAVENOUS | Status: AC
  Filled 2020-02-22: qty 250

## 2020-02-22 MED ORDER — FAMOTIDINE IN NACL 20-0.9 MG/50ML-% IV SOLN
20.0000 mg | Freq: Once | INTRAVENOUS | Status: AC
  Administered 2020-02-22: 20 mg via INTRAVENOUS

## 2020-02-22 MED ORDER — DIPHENHYDRAMINE HCL 50 MG/ML IJ SOLN
INTRAMUSCULAR | Status: AC
  Filled 2020-02-22: qty 1

## 2020-02-22 MED ORDER — SODIUM CHLORIDE 0.9 % IV SOLN
45.0000 mg/m2 | Freq: Once | INTRAVENOUS | Status: AC
  Administered 2020-02-22: 90 mg via INTRAVENOUS
  Filled 2020-02-22: qty 15

## 2020-02-22 MED ORDER — SODIUM CHLORIDE 0.9 % IV SOLN
216.8000 mg | Freq: Once | INTRAVENOUS | Status: AC
  Administered 2020-02-22: 220 mg via INTRAVENOUS
  Filled 2020-02-22: qty 22

## 2020-02-22 MED ORDER — SODIUM CHLORIDE 0.9 % IV SOLN
20.0000 mg | Freq: Once | INTRAVENOUS | Status: AC
  Administered 2020-02-22: 20 mg via INTRAVENOUS
  Filled 2020-02-22: qty 20

## 2020-02-22 MED ORDER — DIPHENHYDRAMINE HCL 50 MG/ML IJ SOLN
50.0000 mg | Freq: Once | INTRAMUSCULAR | Status: AC
  Administered 2020-02-22: 50 mg via INTRAVENOUS

## 2020-02-22 NOTE — Patient Instructions (Signed)
   [facility] Discharge Instructions for Patients Receiving Chemotherapy  Today you received the following chemotherapy agents Taxol and Carboplatin   To help prevent nausea and vomiting after your treatment, we encourage you to take your nausea medication as directed.    If you develop nausea and vomiting that is not controlled by your nausea medication, call the clinic.   BELOW ARE SYMPTOMS THAT SHOULD BE REPORTED IMMEDIATELY:  *FEVER GREATER THAN 100.5 F  *CHILLS WITH OR WITHOUT FEVER  NAUSEA AND VOMITING THAT IS NOT CONTROLLED WITH YOUR NAUSEA MEDICATION  *UNUSUAL SHORTNESS OF BREATH  *UNUSUAL BRUISING OR BLEEDING  TENDERNESS IN MOUTH AND THROAT WITH OR WITHOUT PRESENCE OF ULCERS  *URINARY PROBLEMS  *BOWEL PROBLEMS  UNUSUAL RASH Items with * indicate a potential emergency and should be followed up as soon as possible.  Feel free to call the clinic should you have any questions or concerns. The clinic phone number is [phone].  Please show the CHEMO ALERT CARD at check-in to the Emergency Department and triage nurse.   

## 2020-02-22 NOTE — Progress Notes (Signed)
Nutrition follow-up completed with patient receiving treatment for lung cancer with brain metastases. Patient reports it is very difficult for him to eat.  He tried to eat a sandwich today but could not. He has a poor appetite. He is trying to drink oral nutrition supplements.  States he drinks 1 Ensure every day. Weight decreased and documented as 172.3 pounds October 11 down from 180.1 pounds September 22. Reports constipation is resolved. Noted labs: Glucose 127.  Nutrition diagnosis: Food and nutrition related knowledge deficit continues.  Intervention: Educated patient to increase calories and protein in small amounts throughout the day. Encouraged patient to push oral intake as long as he is not having nausea. Recommended increasing Ensure Plus or equivalent to 3-4 bottles every day. Provided coupons.  Monitoring, evaluation, goals: Patient will tolerate increased calories and protein to minimize weight loss.  Next visit: We will follow-up with upcoming treatments as needed.  Please consult RD if nutrition needs are identified sooner.  **Disclaimer: This note was dictated with voice recognition software. Similar sounding words can inadvertently be transcribed and this note may contain transcription errors which may not have been corrected upon publication of note.**

## 2020-02-23 ENCOUNTER — Ambulatory Visit
Admission: RE | Admit: 2020-02-23 | Discharge: 2020-02-23 | Disposition: A | Discharge status: Home / Self Care | Payer: Medicare HMO | Source: Ambulatory Visit | Attending: Radiation Oncology | Admitting: Radiation Oncology

## 2020-02-23 DIAGNOSIS — Z510 Encounter for antineoplastic radiation therapy: Secondary | ICD-10-CM | POA: Diagnosis not present

## 2020-02-24 ENCOUNTER — Other Ambulatory Visit: Payer: Self-pay

## 2020-02-24 ENCOUNTER — Encounter (HOSPITAL_COMMUNITY): Payer: Self-pay | Admitting: Emergency Medicine

## 2020-02-24 ENCOUNTER — Emergency Department (HOSPITAL_COMMUNITY): Payer: Medicare HMO

## 2020-02-24 ENCOUNTER — Telehealth: Payer: Self-pay | Admitting: Physician Assistant

## 2020-02-24 ENCOUNTER — Ambulatory Visit
Admission: RE | Admit: 2020-02-24 | Discharge: 2020-02-24 | Disposition: A | Discharge status: Home / Self Care | Payer: Medicare HMO | Source: Ambulatory Visit | Attending: Radiation Oncology | Admitting: Radiation Oncology

## 2020-02-24 ENCOUNTER — Emergency Department (HOSPITAL_COMMUNITY)
Admission: EM | Admit: 2020-02-24 | Discharge: 2020-02-24 | Disposition: A | Discharge status: Home / Self Care | Payer: Medicare HMO | Attending: Emergency Medicine | Admitting: Emergency Medicine

## 2020-02-24 ENCOUNTER — Telehealth: Payer: Self-pay | Admitting: Emergency Medicine

## 2020-02-24 DIAGNOSIS — K2900 Acute gastritis without bleeding: Secondary | ICD-10-CM

## 2020-02-24 DIAGNOSIS — Z79899 Other long term (current) drug therapy: Secondary | ICD-10-CM | POA: Diagnosis not present

## 2020-02-24 DIAGNOSIS — E119 Type 2 diabetes mellitus without complications: Secondary | ICD-10-CM | POA: Diagnosis not present

## 2020-02-24 DIAGNOSIS — Z85118 Personal history of other malignant neoplasm of bronchus and lung: Secondary | ICD-10-CM | POA: Insufficient documentation

## 2020-02-24 DIAGNOSIS — R1030 Lower abdominal pain, unspecified: Secondary | ICD-10-CM | POA: Diagnosis present

## 2020-02-24 DIAGNOSIS — I10 Essential (primary) hypertension: Secondary | ICD-10-CM | POA: Diagnosis not present

## 2020-02-24 DIAGNOSIS — Z510 Encounter for antineoplastic radiation therapy: Secondary | ICD-10-CM | POA: Diagnosis not present

## 2020-02-24 DIAGNOSIS — K2960 Other gastritis without bleeding: Secondary | ICD-10-CM | POA: Diagnosis not present

## 2020-02-24 MED ORDER — MORPHINE SULFATE (PF) 4 MG/ML IV SOLN
4.0000 mg | Freq: Once | INTRAVENOUS | Status: DC
  Filled 2020-02-24: qty 1

## 2020-02-24 MED ORDER — IOHEXOL 9 MG/ML PO SOLN
ORAL | Status: AC
  Filled 2020-02-24: qty 1000

## 2020-02-24 MED ORDER — MORPHINE SULFATE (PF) 4 MG/ML IV SOLN: 4.0000 mg | Freq: Once | INTRAVENOUS | Status: DC

## 2020-02-24 MED ORDER — ALUM & MAG HYDROXIDE-SIMETH 200-200-20 MG/5ML PO SUSP
30.0000 mL | Freq: Once | ORAL | Status: AC
  Administered 2020-02-24: 30 mL via ORAL
  Filled 2020-02-24: qty 30

## 2020-02-24 MED ORDER — ALUMINUM & MAGNESIUM HYDROXIDE 200-200 MG/5ML PO SUSP: 10.0000 mL | Freq: Four times a day (QID) | ORAL | 0 refills | Status: DC | PRN

## 2020-02-24 MED ORDER — ONDANSETRON HCL 4 MG/2ML IJ SOLN
4.0000 mg | Freq: Four times a day (QID) | INTRAMUSCULAR | Status: DC | PRN
  Filled 2020-02-24: qty 2

## 2020-02-24 MED ORDER — LACTATED RINGERS IV BOLUS: 1000.0000 mL | Freq: Once | INTRAVENOUS | Status: DC

## 2020-02-24 NOTE — Telephone Encounter (Signed)
Scheduled per los. Called and spoke with patient. Confirmed appt 

## 2020-02-24 NOTE — Telephone Encounter (Signed)
Received call from pt reporting lower abdominal pain since radiation this am, with mild dizziness and diarrhea since yesterday (pt was constipated beforehand).  Denies any other symptoms.  Spoke with MD Mohamed/PA Cassie who advised pt to monitor symptoms, push fluids orally, and take antacids/etc as needed and call back if there is no improvement in the next few hours.  Pt states his pain is severe and that waiting is not an option at this time.  PA Cassie informed, states she will call pt to f/u on reported issues.  Called pt to let him know she would be calling soon, pt states his pain is too great now and would like to go to the [facility] instead.  Pt aware to call back if anything changes before he goes to ED or if he changes his mind, PA Cassie made aware.

## 2020-02-24 NOTE — ED Notes (Signed)
Unable to perform lab draw due to pt refusing to be stuck by primary RN, charge RN, and IV team. MD aware.

## 2020-02-24 NOTE — Discharge Instructions (Signed)
It seems like you have had some side effects from your chemotherapy.  Most likely, you are experiencing some minor erosions in your stomach mucosa.  It seems to have improved significantly with the Magic mouthwash that we gave you.  We did not see any evidence of concerning findings on your abdominal CT.  Unfortunately, we could not rule out infection or liver injury or kidney injury because we could not do any blood work.  Again, this is most likely related to a chemotherapy side effect.  Recommend that you continue diligent use of your omeprazole and Carafate.  I will also add another medicine called Maalox which can help with your stomach discomfort.  I would encourage you to discuss Magic mouthwash with your oncologist if that is helpful if you experience future side effects from your chemotherapy.

## 2020-02-24 NOTE — ED Triage Notes (Signed)
Patient complains of lower abdominal pain that started this morning after his chemo treatment. He denies nausea or vomiting, endorses 3 episodes of diarrhea.

## 2020-02-24 NOTE — ED Provider Notes (Signed)
[facility] DEPT Provider Note   CSN: [id number] Arrival date & time: 02/24/20  1523     History Chief Complaint  Patient presents with  . Abdominal Pain    Jared Shea. is a 64 y.o. male.  Jared. Shea is a 64 year old man who presents to the ED with 2 days of abdominal pain.  He has a previous medical history significant for non-small cell lung cancer (adenocarcinoma) s/p chemotherapy 4 rounds of carboplatin and paclitaxel.  He reports that he has been having very mild abdominal pain which significantly worsened following his chemotherapy this morning.  He has been getting carboplatin for the past 4 weeks and has been having some issues with swallowing and abdominal pain.  The side effects have been bearable so far but the side effects following his most recent chemotherapy session has become unbearable.  He notes that significantly worsened following his treatment today.  He reports that his abdominal pain is currently over his entire abdomen and feels like a twisting of his stomach.  He denies shooting or stabbing pains or radiation of his pain.  He has not noted any association with eating or activity at this point.  Did have mild nausea earlier but none now and vomiting does not seem to be the issue.  He reached out to his oncologist who encouraged him that the side effect is not unexpected and will likely improve with time.  His pain grew worse and decided to present to the ED for further assessment and care.  He has tried taking Compazine at home without any significant improvement in his discomfort.  He has not been given any other medication for his abdominal pain/nausea.        Past Medical History:  Diagnosis Date  . Brain mass   . Bursitis of right hip   . Chest pain 08/12/2018  . Chronic cough   . Dependence on nicotine from cigarettes   . Diabetes mellitus without complication ([name])   . Essential hypertension 07/28/2018  . Seizures ([place])     . Viral illness 03/23/2019    Patient Active Problem List   Diagnosis Date Noted  . Encounter for antineoplastic chemotherapy 01/20/2020  . Adenocarcinoma, metastatic ([place]) 01/07/2020  . Type 2 diabetes mellitus ([name]) 01/06/2020  . Adjustment disorder with anxiety 01/06/2020  . Goals of care, counseling/discussion 01/04/2020  . Hyperglycemia 01/04/2020  . Thyroid nodule 01/04/2020  . Non-small cell carcinoma of lung, stage 4 w/ mets to brain ([place]) 12/31/2019  . Partial symptomatic epilepsy with complex partial seizures, not intractable, without status epilepticus ([name]) 12/23/2019  . Brain metastases ([place]) 12/23/2019  . Pulmonary nodule 12/15/2019  . Mass of frontal lobe 12/14/2019  . Focal seizure ([name]) 12/09/2019  . Facial tingling 11/23/2019  . Healthcare maintenance 12/24/2018  . Tobacco use disorder 08/12/2018  . Essential hypertension 07/28/2018    Past Surgical History:  Procedure Laterality Date  . APPLICATION OF CRANIAL NAVIGATION N/A 01/07/2020   Procedure: APPLICATION OF CRANIAL NAVIGATION;  Surgeon: Ashok Pall, MD;  Location: [facility];  Service: Neurosurgery;  Laterality: N/A;  . CRANIOTOMY Right 01/07/2020   Procedure: RIGHT CRANIOTOMY FOR TUMOR RESECTION;  Surgeon: Ashok Pall, MD;  Location: [facility];  Service: Neurosurgery;  Laterality: Right;  rm 21  . ENDOBRONCHIAL ULTRASOUND N/A 12/21/2019   Procedure: ENDOBRONCHIAL ULTRASOUND;  Surgeon: Laurin Coder, MD;  Location: [facility];  Service: Pulmonary;  Laterality: N/A;  . FINE NEEDLE ASPIRATION  12/21/2019   Procedure: FINE  NEEDLE ASPIRATION (FNA) LINEAR;  Surgeon: Laurin Coder, MD;  Location: [facility];  Service: Pulmonary;;  . NO PAST SURGERIES    . VIDEO BRONCHOSCOPY N/A 12/21/2019   Procedure: VIDEO BRONCHOSCOPY WITHOUT FLUORO;  Surgeon: Laurin Coder, MD;  Location: [facility];  Service: Pulmonary;  Laterality: N/A;       Family History  Problem Relation Age of Onset  . Heart disease  Mother        CABG  . Kidney disease Sister   . Diabetes Brother   . Kidney disease Brother        KIDNEY TRANSPLANT  . Hypertension Sister   . Healthy Daughter   . Healthy Daughter   . Healthy Son     Social History   Tobacco Use  . Smoking status: Former Smoker    Packs/day: 1.00    Years: 48.00    Pack years: 48.00    Types: Cigarettes    Start date: 08/07/1970    Quit date: 12/15/2019    Years since quitting: 0.1  . Smokeless tobacco: Never Used  Vaping Use  . Vaping Use: Never used  Substance Use Topics  . Alcohol use: Not Currently  . Drug use: Never    Home Medications Prior to Admission medications   Medication Sig Start Date End Date Taking? Authorizing Provider  Accu-Chek Softclix Lancets lancets Use as instructed Patient not taking: Reported on 02/22/2020 01/21/20   Gaylan Gerold, DO  blood glucose meter kit and supplies KIT Dispense based on patient and insurance preference. Use up to four times daily as directed. (FOR ICD-9 250.00, 250.01). Patient not taking: Reported on 02/22/2020 01/14/20   Jean Rosenthal, MD  chlorproMAZINE (THORAZINE) 25 MG tablet Take 1 tablet (25 mg total) by mouth 3 (three) times daily as needed. Patient not taking: Reported on 02/22/2020 12/24/19   Laurin Coder, MD  fluconazole (DIFLUCAN) 100 MG tablet Take 1 tablet (100 mg total) by mouth daily. Patient not taking: Reported on 02/22/2020 01/20/20   Curt Bears, MD  levETIRAcetam (KEPPRA) 750 MG tablet Take 1 tablet (750 mg total) by mouth 2 (two) times daily. 12/23/19   Marcial Pacas, MD  lidocaine-prilocaine (EMLA) cream Apply 1 application topically as needed. 02/22/20   Heilingoetter, Cassandra L, PA-C  losartan-hydrochlorothiazide (HYZAAR) 50-12.5 MG tablet Take 1 tablet by mouth daily. 10/13/19   Jean Rosenthal, MD  metFORMIN (GLUCOPHAGE) 500 MG tablet Take 2 tablets (1,000 mg total) by mouth 2 (two) times daily with a meal. 01/05/20 04/04/20  Jose Persia, MD  mirtazapine (REMERON)  15 MG tablet Take 1 tablet (15 mg total) by mouth at bedtime. 02/22/20   Heilingoetter, Cassandra L, PA-C  nystatin (MYCOSTATIN) 100000 UNIT/ML suspension Take 5 mLs (500,000 Units total) by mouth 4 (four) times daily. Patient not taking: Reported on 02/22/2020 01/27/20   Hayden Pedro, PA-C  omeprazole (PRILOSEC) 20 MG capsule Take 1 capsule (20 mg total) by mouth daily. 02/22/20   Heilingoetter, Cassandra L, PA-C  prochlorperazine (COMPAZINE) 10 MG tablet Take 1 tablet (10 mg total) by mouth every 6 (six) hours as needed. 01/14/20   Jean Rosenthal, MD  sucralfate (CARAFATE) 1 g tablet Take 1 tablet (1 g total) by mouth 4 (four) times daily. Dissolve each tablet in 15 cc water before use. Patient not taking: Reported on 02/22/2020 02/19/20   Kyung Rudd, MD    Allergies    Penicillins  Review of Systems   Review of Systems  Constitutional:  Negative for chills and fever.  HENT: Positive for sore throat. Negative for congestion.   Respiratory: Negative for cough, chest tightness, shortness of breath and wheezing.   Cardiovascular: Negative for chest pain and palpitations.  Gastrointestinal: Positive for abdominal pain, diarrhea and nausea. Negative for abdominal distention, constipation and vomiting.  Genitourinary: Negative for dysuria.  Musculoskeletal: Negative for arthralgias.  Skin: Negative for rash.  Neurological: Negative for dizziness and headaches.    Physical Exam Updated Vital Signs BP 120/66 (BP Location: Left Arm)   Pulse 65   Temp (!) 97.5 F (36.4 C) (Oral)   Resp 17   Ht 6' (1.829 m)   Wt 78 kg   SpO2 95%   BMI 23.32 kg/m   Physical Exam Constitutional:      General: He is in acute distress.     Appearance: He is well-developed and normal weight. He is not ill-appearing or toxic-appearing.     Comments: He was seated in the chair in the exam room hunched over clearly experiencing some moderate abdominal pain which seem to come and go during our interview.   HENT:     Head: Normocephalic and atraumatic.     Mouth/Throat:     Pharynx: Oropharynx is clear. No pharyngeal swelling or oropharyngeal exudate.  Eyes:     General: No scleral icterus.    Pupils: Pupils are equal, round, and reactive to light.  Cardiovascular:     Rate and Rhythm: Normal rate and regular rhythm.     Heart sounds: Normal heart sounds.  Pulmonary:     Effort: Pulmonary effort is normal. No respiratory distress.     Breath sounds: No wheezing or rales.  Abdominal:     General: Abdomen is flat. There is no distension.     Palpations: Abdomen is soft. There is no shifting dullness or mass.     Tenderness: There is generalized abdominal tenderness. There is no right CVA tenderness or left CVA tenderness. Negative signs include Murphy's sign.     Hernia: No hernia is present.  Skin:    General: Skin is warm and dry.  Neurological:     General: No focal deficit present.     Mental Status: He is alert.  Psychiatric:        Mood and Affect: Mood normal.        Behavior: Behavior normal.     ED Results / Procedures / Treatments   Labs (all labs ordered are listed, but only abnormal results are displayed) Labs Reviewed  LIPASE, BLOOD  COMPREHENSIVE METABOLIC PANEL  CBC  URINALYSIS, ROUTINE W REFLEX MICROSCOPIC    EKG None  Radiology No results found.  Procedures Procedures (including critical care time)  Medications Ordered in ED Medications  ondansetron (ZOFRAN) injection 4 mg (has no administration in time range)  alum & mag hydroxide-simeth (MAALOX/MYLANTA) 200-200-20 MG/5ML suspension 30 mL (has no administration in time range)    ED Course  I have reviewed the triage vital signs and the nursing notes.  Pertinent labs & imaging results that were available during my care of the patient were reviewed by me and considered in my medical decision making (see chart for details).    MDM Rules/Calculators/A&P                          Jared. Gunderson is a  64 year old man who presents to the ED with 2 days of abdominal pain.  He has a previous  medical history significant for non-small cell lung cancer (adenocarcinoma) s/p chemotherapy 4 rounds of carboplatin and paclitaxel.  History and physical exam are most suspicious for a stomatitis or ulceration secondary to his chemotherapeutic regimen.  I have a low suspicion for infectious gastritis or gastroenteritis based on his lack of infectious symptoms and very recent chemotherapy.  For now, we will start with basic labs and provide a GI cocktail to see if that ameliorates his symptoms.  We had difficulty obtaining labs due to Jared. Mariea Clonts being a difficult stick.  After multiple attempts of blood draws, Jared. Mariea Clonts refused further attempts.  He was asked multiple times to permit [place] to use ultrasound to place an IV and draw blood and he continued to politely declined.  We do have lab work from 10/11 which is relatively recent in lines up with the onset of his symptoms.  His lab work from 10/11 shows normal kidney function and electrolyte balance, normal hemoglobin and no elevated WBC.  CT abdomen shows no evidence of significant intra-abdominal pathology.  Gallbladder, appendix, pancreas and stomach are all normal in appearance.  Were not able to do blood work today due to patient refusal but he demonstrated significant improvement in the ED with Magic mouthwash.  We will send home with Maalox and he was encouraged to use his PPI and Carafate regularly to help avoid future GI symptoms/side effects of his chemotherapy.  Final Clinical Impression(s) / ED Diagnoses Final diagnoses:  None    Rx / DC Orders ED Discharge Orders    None       Matilde Haymaker, MD 02/24/20 2232    Gareth Morgan, MD 02/27/20 2136

## 2020-02-24 NOTE — ED Notes (Signed)
Patient refused lab draw.

## 2020-02-24 NOTE — ED Notes (Signed)
Patient transported to CT 

## 2020-02-25 ENCOUNTER — Ambulatory Visit: Payer: Medicare HMO

## 2020-02-26 ENCOUNTER — Ambulatory Visit
Admission: RE | Admit: 2020-02-26 | Discharge: 2020-02-26 | Disposition: A | Discharge status: Home / Self Care | Payer: Medicare HMO | Source: Ambulatory Visit | Attending: Radiation Oncology | Admitting: Radiation Oncology

## 2020-02-26 DIAGNOSIS — Z510 Encounter for antineoplastic radiation therapy: Secondary | ICD-10-CM | POA: Diagnosis not present

## 2020-02-29 ENCOUNTER — Ambulatory Visit
Admission: RE | Admit: 2020-02-29 | Discharge: 2020-02-29 | Disposition: A | Discharge status: Home / Self Care | Payer: Medicare HMO | Source: Ambulatory Visit | Attending: Radiation Oncology | Admitting: Radiation Oncology

## 2020-02-29 ENCOUNTER — Other Ambulatory Visit: Payer: Self-pay

## 2020-02-29 ENCOUNTER — Encounter: Payer: Self-pay | Admitting: Radiation Oncology

## 2020-02-29 ENCOUNTER — Telehealth: Payer: Self-pay | Admitting: Medical Oncology

## 2020-02-29 ENCOUNTER — Other Ambulatory Visit: Payer: Self-pay | Admitting: Radiation Oncology

## 2020-02-29 ENCOUNTER — Inpatient Hospital Stay: Payer: Medicare HMO

## 2020-02-29 DIAGNOSIS — Z510 Encounter for antineoplastic radiation therapy: Secondary | ICD-10-CM | POA: Diagnosis not present

## 2020-02-29 MED ORDER — LIDOCAINE VISCOUS HCL 2 % MT SOLN: 15.0000 mL | OROMUCOSAL | 1 refills | Status: DC | PRN

## 2020-02-29 NOTE — Telephone Encounter (Signed)
Pt declined treatment today due to severe constipation . Ebony Hail gave his instructions rro constipation . Schedule message sent to r/s later this week.

## 2020-02-29 NOTE — Progress Notes (Signed)
The patient was seen today due to complaints of esphagitis and abdominal pain. He is being treated for lung cancer and has received 24/33 fxns to the chest. He has not been taking carafate, and has not been taking any laxatives. He was seen in the ED last Thursday and no acute findings were seen on an abdominal/pelvic CT without contrast. He describes eating and drinking mostly liquids. His chemistries from last week did not show active concerns for dehydration.   Vitals today showed pulse in the 40s-50s, and BP was in the 604V systolic.   In general this is a well appearing African American male in no acute distress. He's alert and oriented x4 and appropriate throughout the examination. Cardiopulmonary assessment is negative for acute distress and he exhibits normal effort. Abdomen is intact and is mildly distended. The abdomen is tympanic to percussion over the right and mid abdomen and dull to percussion in the mid upper and left abdomen. It is soft, non tender. Lower extremities are negative for pretibial pitting edema, deep calf tenderness, cyanosis. He has chronic clubbing of his fingernails.  I believe the patient would benefit from resuming carafate for his esophagitis, as well as using viscous lidocaine for his esophagitis with precautions to avoid aspiration. He is not clinically dehydrated in reviewing recent labs, so I don't think he needs IVF. I also suggested he follow up with Dr. Radford Pax given his bradycardia in this setting. He is asymptomatic regarding this but I think it needs to be further pursued. I did review his films from his noncontrasted CT scan a few days ago and he has large volume of stool in the colon. We reviewed milk of magnesia bid until his stools are soft and almost liquid, then switching to miralax daily prn.   We will see how he does with these recommendations and see him again at the end of the week for his PUT visit. He has requested to postpone his chemo today and I've  also spoken with nursing in medical oncology.      Carola Rhine, PAC

## 2020-03-01 ENCOUNTER — Ambulatory Visit: Payer: Medicare HMO

## 2020-03-01 ENCOUNTER — Telehealth: Payer: Self-pay | Admitting: *Deleted

## 2020-03-01 ENCOUNTER — Encounter: Payer: Self-pay | Admitting: Internal Medicine

## 2020-03-01 NOTE — Telephone Encounter (Signed)
Spoke with the patient to see how he was feeling.  He states that he had an episode of vomiting this morning and his throat continues to feel painful.  He has not been able to eat or drink much.  He states he used the milk of magnesia yesterday per the PA suggestion.  He has some results this morning but feels it was incomplete.  He was advised to continue using the milk of magnesia for his bowels and to use the lidocaine solution for his throat.  He was encouraged to take compazine first to help with his nausea and then use the lidocaine.  He was advised to wait 1 hour after taking the lidocaine before eating or drinking anything.   He verbalized understanding of all instructions.  He was encouraged to call if his nausea persists despite taking the medication.    Gloriajean Dell. Leonie Green, BSN

## 2020-03-02 ENCOUNTER — Telehealth: Payer: Self-pay | Admitting: Radiation Oncology

## 2020-03-02 ENCOUNTER — Ambulatory Visit
Admission: RE | Admit: 2020-03-02 | Discharge: 2020-03-02 | Disposition: A | Discharge status: Home / Self Care | Payer: Medicare HMO | Source: Ambulatory Visit | Attending: Radiation Oncology | Admitting: Radiation Oncology

## 2020-03-02 ENCOUNTER — Other Ambulatory Visit: Payer: Self-pay | Admitting: Student

## 2020-03-02 ENCOUNTER — Encounter (HOSPITAL_COMMUNITY): Payer: Self-pay

## 2020-03-02 ENCOUNTER — Emergency Department (HOSPITAL_COMMUNITY)
Admission: EM | Admit: 2020-03-02 | Discharge: 2020-03-02 | Disposition: A | Discharge status: Home / Self Care | Payer: Medicare HMO | Attending: Emergency Medicine | Admitting: Emergency Medicine

## 2020-03-02 ENCOUNTER — Encounter: Payer: Self-pay | Admitting: Radiation Oncology

## 2020-03-02 VITALS — BP 93/60 | HR 55 | Temp 97.5°F | Resp 20

## 2020-03-02 DIAGNOSIS — Z510 Encounter for antineoplastic radiation therapy: Secondary | ICD-10-CM | POA: Diagnosis not present

## 2020-03-02 DIAGNOSIS — E119 Type 2 diabetes mellitus without complications: Secondary | ICD-10-CM | POA: Diagnosis not present

## 2020-03-02 DIAGNOSIS — R197 Diarrhea, unspecified: Secondary | ICD-10-CM | POA: Diagnosis not present

## 2020-03-02 DIAGNOSIS — Z79899 Other long term (current) drug therapy: Secondary | ICD-10-CM | POA: Diagnosis not present

## 2020-03-02 DIAGNOSIS — I10 Essential (primary) hypertension: Secondary | ICD-10-CM | POA: Diagnosis not present

## 2020-03-02 DIAGNOSIS — R112 Nausea with vomiting, unspecified: Secondary | ICD-10-CM | POA: Insufficient documentation

## 2020-03-02 DIAGNOSIS — Z20822 Contact with and (suspected) exposure to covid-19: Secondary | ICD-10-CM | POA: Diagnosis not present

## 2020-03-02 DIAGNOSIS — Z7984 Long term (current) use of oral hypoglycemic drugs: Secondary | ICD-10-CM | POA: Diagnosis not present

## 2020-03-02 DIAGNOSIS — Z87891 Personal history of nicotine dependence: Secondary | ICD-10-CM | POA: Diagnosis not present

## 2020-03-02 DIAGNOSIS — R10815 Periumbilic abdominal tenderness: Secondary | ICD-10-CM | POA: Diagnosis not present

## 2020-03-02 DIAGNOSIS — C3490 Malignant neoplasm of unspecified part of unspecified bronchus or lung: Secondary | ICD-10-CM

## 2020-03-02 LAB — CBC WITH DIFFERENTIAL/PLATELET
Abs Immature Granulocytes: 0.03 10*3/uL (ref 0.00–0.07)
Basophils Absolute: 0 10*3/uL (ref 0.0–0.1)
Basophils Relative: 0 %
Eosinophils Absolute: 0 10*3/uL (ref 0.0–0.5)
Eosinophils Relative: 0 %
HCT: 36.5 % — ABNORMAL LOW (ref 39.0–52.0)
Hemoglobin: 12.9 g/dL — ABNORMAL LOW (ref 13.0–17.0)
Immature Granulocytes: 1 %
Lymphocytes Relative: 11 %
Lymphs Abs: 0.6 10*3/uL — ABNORMAL LOW (ref 0.7–4.0)
MCH: 32.3 pg (ref 26.0–34.0)
MCHC: 35.3 g/dL (ref 30.0–36.0)
MCV: 91.3 fL (ref 80.0–100.0)
Monocytes Absolute: 0.5 10*3/uL (ref 0.1–1.0)
Monocytes Relative: 8 %
Neutro Abs: 4.5 10*3/uL (ref 1.7–7.7)
Neutrophils Relative %: 80 %
Platelets: 217 10*3/uL (ref 150–400)
RBC: 4 MIL/uL — ABNORMAL LOW (ref 4.22–5.81)
RDW: 16.5 % — ABNORMAL HIGH (ref 11.5–15.5)
WBC: 5.7 10*3/uL (ref 4.0–10.5)
nRBC: 0 % (ref 0.0–0.2)

## 2020-03-02 LAB — COMPREHENSIVE METABOLIC PANEL
ALT: 45 U/L — ABNORMAL HIGH (ref 0–44)
AST: 34 U/L (ref 15–41)
Albumin: 4.3 g/dL (ref 3.5–5.0)
Alkaline Phosphatase: 65 U/L (ref 38–126)
Anion gap: 15 (ref 5–15)
BUN: 21 mg/dL (ref 8–23)
CO2: 23 mmol/L (ref 22–32)
Calcium: 9.5 mg/dL (ref 8.9–10.3)
Chloride: 102 mmol/L (ref 98–111)
Creatinine, Ser: 0.77 mg/dL (ref 0.61–1.24)
GFR, Estimated: >60 mL/min (ref >60)
Glucose, Bld: 95 mg/dL (ref 70–99)
Potassium: 4 mmol/L (ref 3.5–5.1)
Sodium: 140 mmol/L (ref 135–145)
Total Bilirubin: 1.5 mg/dL — ABNORMAL HIGH (ref 0.3–1.2)
Total Protein: 7.3 g/dL (ref 6.5–8.1)

## 2020-03-02 LAB — URINALYSIS, ROUTINE W REFLEX MICROSCOPIC
Bacteria, UA: NONE SEEN
Bilirubin Urine: NEGATIVE
Glucose, UA: NEGATIVE mg/dL
Hgb urine dipstick: NEGATIVE
Ketones, ur: 5 mg/dL — AB
Leukocytes,Ua: NEGATIVE
Nitrite: NEGATIVE
Protein, ur: 30 mg/dL — AB
Specific Gravity, Urine: 1.029 (ref 1.005–1.030)
pH: 5 (ref 5.0–8.0)

## 2020-03-02 LAB — RESPIRATORY PANEL BY RT PCR (FLU A&B, COVID)
Influenza A by PCR: NEGATIVE
Influenza B by PCR: NEGATIVE
SARS Coronavirus 2 by RT PCR: NEGATIVE

## 2020-03-02 LAB — LIPASE, BLOOD: Lipase: 47 U/L (ref 11–51)

## 2020-03-02 MED ORDER — ONDANSETRON 4 MG PO TBDP: 4.0000 mg | ORAL_TABLET | Freq: Three times a day (TID) | ORAL | 0 refills | Status: DC | PRN

## 2020-03-02 MED ORDER — ONDANSETRON HCL 4 MG/2ML IJ SOLN
4.0000 mg | Freq: Once | INTRAMUSCULAR | Status: AC
  Administered 2020-03-02: 4 mg via INTRAVENOUS
  Filled 2020-03-02: qty 2

## 2020-03-02 MED ORDER — CALCIUM CARBONATE ANTACID 500 MG PO CHEW
2.0000 | CHEWABLE_TABLET | Freq: Once | ORAL | Status: AC
  Administered 2020-03-02: 400 mg via ORAL
  Filled 2020-03-02: qty 2

## 2020-03-02 MED ORDER — LORAZEPAM 1 MG PO TABS
0.5000 mg | ORAL_TABLET | Freq: Once | ORAL | Status: AC
  Administered 2020-03-02: 0.5 mg via ORAL
  Filled 2020-03-02: qty 0.5

## 2020-03-02 MED ORDER — LACTATED RINGERS IV BOLUS
1000.0000 mL | Freq: Once | INTRAVENOUS | Status: AC
  Administered 2020-03-02: 1000 mL via INTRAVENOUS

## 2020-03-02 NOTE — ED Provider Notes (Signed)
[facility] DEPT Provider Note   CSN: [id number] Arrival date & time: 03/02/20  9201     History Chief Complaint  Patient presents with  . Emesis  . Abdominal Pain    Jared Shea. is a 64 y.o. male.  HPI      Jared Shea. is a 64 y.o. male, with a history of DM, HTN, non-small cell carcinoma of the lung, presenting to the ED with nausea and vomiting beginning this morning.  He has experienced a couple episodes of nonbilious nonbloody emesis.  Complains of intermittent abdominal pain for the last couple weeks, cramping, periumbilical, 0/07, nonradiating. Has also been experiencing diarrhea.   Has been receiving chemo and radiation for the past month. Chemo every Monday and radiation for the rest of the week. Followed by Dr. Julien Nordmann. He does have Covid vaccination. Denies measured fever, hematochezia/melena, acute chest pain, acute shortness of breath, syncope, or any other complaints.   Past Medical History:  Diagnosis Date  . Brain mass   . Bursitis of right hip   . Chest pain 08/12/2018  . Chronic cough   . Dependence on nicotine from cigarettes   . Diabetes mellitus without complication ([place])   . Essential hypertension 07/28/2018  . Seizures ([place])   . Viral illness 03/23/2019    Patient Active Problem List   Diagnosis Date Noted  . Encounter for antineoplastic chemotherapy 01/20/2020  . Adenocarcinoma, metastatic ([place]) 01/07/2020  . Type 2 diabetes mellitus ([place]) 01/06/2020  . Adjustment disorder with anxiety 01/06/2020  . Goals of care, counseling/discussion 01/04/2020  . Hyperglycemia 01/04/2020  . Thyroid nodule 01/04/2020  . Non-small cell carcinoma of lung, stage 4 w/ mets to brain ([place]) 12/31/2019  . Partial symptomatic epilepsy with complex partial seizures, not intractable, without status epilepticus ([place]) 12/23/2019  . Brain metastases ([place]) 12/23/2019  . Pulmonary nodule 12/15/2019  . Mass of frontal lobe  12/14/2019  . Focal seizure ([place]) 12/09/2019  . Facial tingling 11/23/2019  . Healthcare maintenance 12/24/2018  . Tobacco use disorder 08/12/2018  . Essential hypertension 07/28/2018    Past Surgical History:  Procedure Laterality Date  . APPLICATION OF CRANIAL NAVIGATION N/A 01/07/2020   Procedure: APPLICATION OF CRANIAL NAVIGATION;  Surgeon: Ashok Pall, MD;  Location: [facility];  Service: Neurosurgery;  Laterality: N/A;  . CRANIOTOMY Right 01/07/2020   Procedure: RIGHT CRANIOTOMY FOR TUMOR RESECTION;  Surgeon: Ashok Pall, MD;  Location: [facility];  Service: Neurosurgery;  Laterality: Right;  rm 21  . ENDOBRONCHIAL ULTRASOUND N/A 12/21/2019   Procedure: ENDOBRONCHIAL ULTRASOUND;  Surgeon: Laurin Coder, MD;  Location: [facility];  Service: Pulmonary;  Laterality: N/A;  . FINE NEEDLE ASPIRATION  12/21/2019   Procedure: FINE NEEDLE ASPIRATION (FNA) LINEAR;  Surgeon: Laurin Coder, MD;  Location: [facility];  Service: Pulmonary;;  . NO PAST SURGERIES    . VIDEO BRONCHOSCOPY N/A 12/21/2019   Procedure: VIDEO BRONCHOSCOPY WITHOUT FLUORO;  Surgeon: Laurin Coder, MD;  Location: [facility];  Service: Pulmonary;  Laterality: N/A;       Family History  Problem Relation Age of Onset  . Heart disease Mother        CABG  . Kidney disease Sister   . Diabetes Brother   . Kidney disease Brother        KIDNEY TRANSPLANT  . Hypertension Sister   . Healthy Daughter   . Healthy Daughter   . Healthy Son     Social History   Tobacco  Use  . Smoking status: Former Smoker    Packs/day: 1.00    Years: 48.00    Pack years: 48.00    Types: Cigarettes    Start date: 08/07/1970    Quit date: 12/15/2019    Years since quitting: 0.2  . Smokeless tobacco: Never Used  Vaping Use  . Vaping Use: Never used  Substance Use Topics  . Alcohol use: Not Currently  . Drug use: Never    Home Medications Prior to Admission medications   Medication Sig Start Date End Date Taking? Authorizing  Provider  aluminum-magnesium hydroxide 200-200 MG/5ML suspension Take 10 mLs by mouth every 6 (six) hours as needed for indigestion. 02/24/20  Yes Matilde Haymaker, MD  levETIRAcetam (KEPPRA) 750 MG tablet Take 1 tablet (750 mg total) by mouth 2 (two) times daily. 12/23/19  Yes Marcial Pacas, MD  lidocaine (XYLOCAINE) 2 % solution Use as directed 15 mLs in the mouth or throat every 4 (four) hours as needed for mouth pain. 02/29/20  Yes Hayden Pedro, PA-C  lidocaine-prilocaine (EMLA) cream Apply 1 application topically as needed. Patient taking differently: Apply 1 application topically.  02/22/20  Yes Heilingoetter, Cassandra L, PA-C  losartan-hydrochlorothiazide (HYZAAR) 50-12.5 MG tablet Take 1 tablet by mouth daily. 10/13/19  Yes Jean Rosenthal, MD  metFORMIN (GLUCOPHAGE) 500 MG tablet Take 2 tablets (1,000 mg total) by mouth 2 (two) times daily with a meal. 01/05/20 04/04/20 Yes Jose Persia, MD  mirtazapine (REMERON) 15 MG tablet Take 1 tablet (15 mg total) by mouth at bedtime. 02/22/20  Yes Heilingoetter, Cassandra L, PA-C  omeprazole (PRILOSEC) 20 MG capsule Take 1 capsule (20 mg total) by mouth daily. 02/22/20  Yes Heilingoetter, Cassandra L, PA-C  prochlorperazine (COMPAZINE) 10 MG tablet Take 1 tablet (10 mg total) by mouth every 6 (six) hours as needed. Patient taking differently: Take 10 mg by mouth every 6 (six) hours as needed for nausea or vomiting.  01/14/20  Yes Jean Rosenthal, MD  Accu-Chek Softclix Lancets lancets Use as instructed Patient not taking: Reported on 02/22/2020 01/21/20   Gaylan Gerold, DO  blood glucose meter kit and supplies KIT Dispense based on patient and insurance preference. Use up to four times daily as directed. (FOR ICD-9 250.00, 250.01). Patient not taking: Reported on 02/22/2020 01/14/20   Jean Rosenthal, MD  chlorproMAZINE (THORAZINE) 25 MG tablet Take 1 tablet (25 mg total) by mouth 3 (three) times daily as needed. Patient not taking: Reported on 02/22/2020  12/24/19   Laurin Coder, MD  fluconazole (DIFLUCAN) 100 MG tablet Take 1 tablet (100 mg total) by mouth daily. Patient not taking: Reported on 02/22/2020 01/20/20   Curt Bears, MD  nystatin (MYCOSTATIN) 100000 UNIT/ML suspension Take 5 mLs (500,000 Units total) by mouth 4 (four) times daily. Patient not taking: Reported on 02/22/2020 01/27/20   Hayden Pedro, PA-C  ondansetron (ZOFRAN ODT) 4 MG disintegrating tablet Take 1 tablet (4 mg total) by mouth every 8 (eight) hours as needed for nausea or vomiting. 03/02/20   Rickell Wiehe C, PA-C  sucralfate (CARAFATE) 1 g tablet Take 1 tablet (1 g total) by mouth 4 (four) times daily. Dissolve each tablet in 15 cc water before use. Patient not taking: Reported on 02/22/2020 02/19/20   Kyung Rudd, MD    Allergies    Penicillins  Review of Systems   Review of Systems  Constitutional: Negative for chills.  Respiratory: Negative for cough and shortness of breath.   Gastrointestinal: Positive  for abdominal pain, diarrhea, nausea and vomiting.  Musculoskeletal: Negative for back pain.  Neurological: Negative for dizziness, syncope, weakness and numbness.  All other systems reviewed and are negative.   Physical Exam Updated Vital Signs BP 113/83   Pulse 60   Temp 97.8 F (36.6 C) (Oral)   Resp 18   SpO2 97%   Physical Exam Vitals and nursing note reviewed.  Constitutional:      General: He is not in acute distress.    Appearance: He is well-developed. He is not diaphoretic.  HENT:     Head: Normocephalic and atraumatic.     Mouth/Throat:     Mouth: Mucous membranes are moist.     Pharynx: Oropharynx is clear.  Eyes:     Conjunctiva/sclera: Conjunctivae normal.  Cardiovascular:     Rate and Rhythm: Normal rate and regular rhythm.     Pulses: Normal pulses.          Radial pulses are 2+ on the right side and 2+ on the left side.       Posterior tibial pulses are 2+ on the right side and 2+ on the left side.     Heart  sounds: Normal heart sounds.     Comments: Tactile temperature in the extremities appropriate and equal bilaterally. Pulmonary:     Effort: Pulmonary effort is normal. No respiratory distress.     Breath sounds: Normal breath sounds.  Abdominal:     Palpations: Abdomen is soft.     Tenderness: There is abdominal tenderness in the periumbilical area. There is no guarding.     Comments: Seemingly mild tenderness.  Musculoskeletal:     Cervical back: Neck supple.     Right lower leg: No edema.     Left lower leg: No edema.  Lymphadenopathy:     Cervical: No cervical adenopathy.  Skin:    General: Skin is warm and dry.  Neurological:     Mental Status: He is alert.  Psychiatric:        Mood and Affect: Mood and affect normal.        Speech: Speech normal.        Behavior: Behavior normal.     ED Results / Procedures / Treatments   Labs (all labs ordered are listed, but only abnormal results are displayed) Labs Reviewed  URINALYSIS, ROUTINE W REFLEX MICROSCOPIC - Abnormal; Notable for the following components:      Result Value   Color, Urine AMBER (*)    APPearance HAZY (*)    Ketones, ur 5 (*)    Protein, ur 30 (*)    All other components within normal limits  COMPREHENSIVE METABOLIC PANEL - Abnormal; Notable for the following components:   ALT 45 (*)    Total Bilirubin 1.5 (*)    All other components within normal limits  CBC WITH DIFFERENTIAL/PLATELET - Abnormal; Notable for the following components:   RBC 4.00 (*)    Hemoglobin 12.9 (*)    HCT 36.5 (*)    RDW 16.5 (*)    Lymphs Abs 0.6 (*)    All other components within normal limits  RESPIRATORY PANEL BY RT PCR (FLU A&B, COVID)  LIPASE, BLOOD    EKG None  Radiology No results found.  Procedures Procedures (including critical care time)  Medications Ordered in ED Medications  calcium carbonate (TUMS - dosed in mg elemental calcium) chewable tablet 400 mg of elemental calcium (has no administration in  time range)  lactated  ringers bolus 1,000 mL (1,000 mLs Intravenous New Bag/Given 03/02/20 1430)  ondansetron (ZOFRAN) injection 4 mg (4 mg Intravenous Given 03/02/20 1548)    ED Course  I have reviewed the triage vital signs and the nursing notes.  Pertinent labs & imaging results that were available during my care of the patient were reviewed by me and considered in my medical decision making (see chart for details).    MDM Rules/Calculators/A&P                          Patient presents with nausea and vomiting starting shortly prior to arrival. He has had issues with intermittent nausea and vomiting, intermittent abdominal discomfort, diarrhea, and poor appetite for the last couple weeks. Patient is nontoxic appearing, afebrile, not tachycardic, not tachypneic, not hypotensive, maintains excellent SPO2 on room air, and is in no apparent distress.   I have reviewed the patient's chart to obtain more information.   I reviewed and interpreted the patient's labs and radiological studies. Very mild elevations in ALT and total bilirubin, nonspecific at this time.  No right upper quadrant tenderness.  Patient was having similar symptoms when he was evaluated in the ED on October 13.  CT abdomen/pelvis was performed without acute abnormality noted. No vomiting here in the ED. Tolerating PO at time of discharge.  The patient was given instructions for home care as well as return precautions. Patient voices understanding of these instructions, accepts the plan, and is comfortable with discharge.  Findings and plan of care discussed with Delphina Cahill, MD. Dr. Alvino Chapel personally evaluated and examined this patient.  Vitals:   03/02/20 1130 03/02/20 1230 03/02/20 1330 03/02/20 1530  BP: 116/72 109/82 116/81 122/81  Pulse: (!) 45 93 (!) 46 80  Resp: '20 19 18 19  ' Temp:      TempSrc:      SpO2: 98% 96% 96% 98%     Final Clinical Impression(s) / ED Diagnoses Final diagnoses:  Nausea  vomiting and diarrhea    Rx / DC Orders ED Discharge Orders         Ordered    ondansetron (ZOFRAN ODT) 4 MG disintegrating tablet  Every 8 hours PRN        03/02/20 1611           Lorayne Bender, PA-C 03/02/20 1615    Davonna Belling, MD 03/03/20 [phone]

## 2020-03-02 NOTE — ED Notes (Signed)
Pt refusing IV stick from me, specifically.

## 2020-03-02 NOTE — Progress Notes (Signed)
Patient brought to nursing clinic from treatment area with complaints of vomiting.  Vital signs obtained and blood pressure noted to be low.  Patient states he has not been eating or drinking much in the last couple of weeks.  He started vomiting on yesterday and it has continued.  He was seen by the PA and was noted to have tenderness in his abdomen, he has bowel sounds in all quadrants.  He states his bowels finally moved on yesterday morning after taking milk of magnesia at our recommendation.  He states that he has not urinated since Sunday 10/17.  He states that he feels like he has had a fever at home, unsure of how high.  He looks uncomfortable in the wheelchair, often grimacing.  He states when he vomits it is clear liquid coming out. At the recommendation of the PA he is willing to be evaluated in the emergency room.  He was given 0.5 mg ativan in the nursing clinic prior to transport to the ER.  BP 93/60   Pulse (!) 55   Temp (!) 97.5 F (36.4 C)   Resp 20   SpO2 99%    Philomene Haff M. Leonie Green, BSN

## 2020-03-02 NOTE — ED Notes (Signed)
Pt given apple juice with instructions to drink. Will continue to monitor

## 2020-03-02 NOTE — Progress Notes (Addendum)
Radiation Oncology         [phone]) [phone] ________________________________  Name: Jared Shea. MRN: [id number]  Date of Service: 03/02/2020  DOB: August 19, 1955  Under Treatment Visit:  Diagnosis:    Stage IV, NSCLC of the LUL involving the right frontal lobe of the brain  Interval Since Last Radiation: today  01/25/20-present: The patient's left lung target and regional nodes are being treated daily to a total of 66 Gy. He has currently received 50 Gy in 25/33 fractions.  01/05/20 SRS Treatment: 15 Gy in 1 fraction ExacTrac.4 DCA beams max dose=128% PTV1 Rt Frontal 73mm  Narrative:  The patient was seen as a work in today after radiotherapy.  The patient has a known history of a stage IV presentation of non-small cell lung cancer.  He was offered aggressive treatment following stereotactic radiosurgery due to oligometastatic disease.  He is currently receiving chemoradiation, and has received 25 out of 33 anticipated fractions.  Last week he started experiencing abdominal pain and was seen in the emergency room on 02/24/2020 with noncontrasted imaging he did not have any acute findings.  I saw him on Monday in the clinic after radiation, and he was not feeling up to chemotherapy but did receive radiation to the chest.  Dr. Lisbeth Renshaw also assured me that his radiation fields do not overlap the stomach, but his current chemo regimen is carboplatin and Taxol on a weekly schedule.  He has not had any further chemo this week, last week his kidney function was normal, and for this reason we did not repeat his labs Monday this week.  He was encouraged to try over-the-counter bowel regimen with milk of magnesia and MiraLAX given his CT films upon my review were suspicious for retained stool.  He has had some improvement in his constipation with these medications but today comes in stating that he has been having progressive nausea and vomiting as well as episodes of crampy pain in the abdomen.  He is not  having any headaches, chest pain shortness of breath, but does state that he does not have the ability to take his temperature at home and thinks he has been having some fevers.  He also reports that he has not had any urine output since Sunday upon further discussion.  Physical Exam: Wt Readings from Last 3 Encounters:  02/24/20 171 lb 15.3 oz (78 kg)  02/22/20 172 lb 4.8 oz (78.2 kg)  02/15/20 176 lb 1.9 oz (79.9 kg)   Temp Readings from Last 3 Encounters:  03/02/20 (!) 97.5 F (36.4 C)  02/24/20 98 F (36.7 C) (Oral)  02/22/20 97.8 F (36.6 C) (Tympanic)   BP Readings from Last 3 Encounters:  03/02/20 93/60  02/24/20 127/62  02/22/20 103/79   Pulse Readings from Last 3 Encounters:  03/02/20 (!) 55  02/24/20 77  02/22/20 99   In general this is an acutely ill-appearing and fatigued African-American male in no acute distress. He is alert and oriented x4 and appropriate throughout the examination. HEENT reveals that the patient is normocephalic, atraumatic. EOMs are intact. PERRLA. Skin is intact without any evidence of gross lesions. No jaundice or icterus is noted . Cardiovascular exam reveals a bradycardic rate and normal rhythm, no clicks rubs or murmurs are auscultated. Chest is clear to auscultation bilaterally.  Abdomen has active bowel sounds in all quadrants and is intact. The abdomen is soft,  non distended, but point tender in the LLQ. Rebound pain is noted but no guarding is appreciated.  Lower extremities are negative for pretibial pitting edema, deep calf tenderness, cyanosis or clubbing.   Impression/Plan: 1.  Stage IV, NSCLC of the LUL involving the right frontal lobe of the brain. The patient is to continue his regimen of daily radiotherapy to the chest. His chemo this week is on hold and will resume on Monday only due to scheduling from what I can tell.  2. Nausea/Vomiting/Abdominal pain, oliguria with hypotension. The patient clinically appears ill. While he looked  much better earlier this week and did not have suspicion for being dehydrated clinically or by the prior weeks labs, currently I'm concerned he may have acute renal injury contributing to his symptoms. He does not have symptoms of central nausea from his prior brain treatment, so I truly think he has renal or abdominal pathology unrelated to radiotherapy, possibly liked to the platin agent of his chemo regimen (Carboplatin) causing his symptoms. He does not have a known history of renal disease We appreciate the work up in the ED and have called Tim the charge nurse to expedite his workup. He was given Ativan .5 mg SL x1 for nausea.  3. Bradycardia and hypotension. The patient was in the process of being worked up by cardiology prior to his diagnosis and needs follow up with Dr. Radford Pax. I'm not sure how his hypotension today relates to his cardiac function.    Carola Rhine, PAC

## 2020-03-02 NOTE — ED Triage Notes (Signed)
abd pain since this am. Was at cancer center for radiation butwas unable to start b/c of N/V.

## 2020-03-02 NOTE — Telephone Encounter (Signed)
LM for the patient's sister Ivin Booty regarding his evaluation today and need for ED evaluation. I encouraged her to call us back if she has questions.

## 2020-03-02 NOTE — ED Notes (Signed)
Pt states he is able to take small sips of juice but feels indigestion. Pt denies nausea

## 2020-03-02 NOTE — Discharge Instructions (Signed)
Nausea, Vomiting, and Diarrhea  Hand washing: Wash your hands throughout the day, but especially before and after touching the face, using the restroom, sneezing, coughing, or touching surfaces that have been coughed or sneezed upon. Hydration: Symptoms will be intensified and complicated by dehydration. Dehydration can also extend the duration of symptoms. Drink plenty of fluids and get plenty of rest. You should be drinking at least half a liter of water an hour to stay hydrated. Electrolyte drinks (ex. Gatorade, Powerade, Pedialyte) are also encouraged. You should be drinking enough fluids to make your urine light yellow, almost clear. If this is not the case, you are not drinking enough water. Please note that some of the treatments indicated below will not be effective if you are not adequately hydrated. Diet: Please concentrate on hydration, however, you may introduce food slowly.  Start with a clear liquid diet, progressed to a full liquid diet, and then bland solids as you are able. Pain or fever: Ibuprofen, Naproxen, or Tylenol for pain or fever.  Nausea/vomiting: Use the ondansetron (generic for Zofran) for nausea or vomiting.  This medication may not prevent all vomiting or nausea, but can help facilitate better hydration. Things that can help with nausea/vomiting also include peppermint/menthol candies, vitamin B12, and ginger. Diarrhea: May use medications such as loperamide (Imodium) or Bismuth subsalicylate (Pepto-Bismol). Follow-up: Follow-up with a primary care provider on this matter. Return: Return should you develop a fever, bloody diarrhea, increased abdominal pain, uncontrolled vomiting, or any other major concerns.  For prescription assistance, may try using prescription discount sites or apps, such as goodrx.com

## 2020-03-03 ENCOUNTER — Ambulatory Visit (HOSPITAL_COMMUNITY)
Admission: RE | Admit: 2020-03-03 | Discharge: 2020-03-03 | Disposition: A | Discharge status: Home / Self Care | Payer: Medicare HMO | Source: Ambulatory Visit | Attending: Physician Assistant | Admitting: Physician Assistant

## 2020-03-03 ENCOUNTER — Other Ambulatory Visit: Payer: Self-pay

## 2020-03-03 ENCOUNTER — Encounter (HOSPITAL_COMMUNITY): Payer: Self-pay

## 2020-03-03 ENCOUNTER — Other Ambulatory Visit: Payer: Self-pay | Admitting: Physician Assistant

## 2020-03-03 ENCOUNTER — Ambulatory Visit
Admission: RE | Admit: 2020-03-03 | Discharge: 2020-03-03 | Disposition: A | Discharge status: Home / Self Care | Payer: Medicare HMO | Source: Ambulatory Visit | Attending: Radiation Oncology | Admitting: Radiation Oncology

## 2020-03-03 DIAGNOSIS — I10 Essential (primary) hypertension: Secondary | ICD-10-CM | POA: Insufficient documentation

## 2020-03-03 DIAGNOSIS — C3490 Malignant neoplasm of unspecified part of unspecified bronchus or lung: Secondary | ICD-10-CM

## 2020-03-03 DIAGNOSIS — Z87891 Personal history of nicotine dependence: Secondary | ICD-10-CM | POA: Diagnosis not present

## 2020-03-03 DIAGNOSIS — R569 Unspecified convulsions: Secondary | ICD-10-CM | POA: Insufficient documentation

## 2020-03-03 DIAGNOSIS — Z79899 Other long term (current) drug therapy: Secondary | ICD-10-CM | POA: Diagnosis not present

## 2020-03-03 DIAGNOSIS — Z8249 Family history of ischemic heart disease and other diseases of the circulatory system: Secondary | ICD-10-CM | POA: Diagnosis not present

## 2020-03-03 DIAGNOSIS — Z880 Allergy status to penicillin: Secondary | ICD-10-CM | POA: Insufficient documentation

## 2020-03-03 DIAGNOSIS — Z833 Family history of diabetes mellitus: Secondary | ICD-10-CM | POA: Insufficient documentation

## 2020-03-03 DIAGNOSIS — Z510 Encounter for antineoplastic radiation therapy: Secondary | ICD-10-CM | POA: Diagnosis not present

## 2020-03-03 DIAGNOSIS — Z7984 Long term (current) use of oral hypoglycemic drugs: Secondary | ICD-10-CM | POA: Insufficient documentation

## 2020-03-03 DIAGNOSIS — E119 Type 2 diabetes mellitus without complications: Secondary | ICD-10-CM | POA: Insufficient documentation

## 2020-03-03 DIAGNOSIS — C7931 Secondary malignant neoplasm of brain: Secondary | ICD-10-CM | POA: Diagnosis not present

## 2020-03-03 HISTORY — PX: IR IMAGING GUIDED PORT INSERTION: IMG5740

## 2020-03-03 LAB — GLUCOSE, CAPILLARY: Glucose-Capillary: 78 mg/dL (ref 70–99)

## 2020-03-03 MED ORDER — MIDAZOLAM HCL 2 MG/2ML IJ SOLN
INTRAMUSCULAR | Status: AC | PRN
  Administered 2020-03-03 (×2): 1 mg via INTRAVENOUS

## 2020-03-03 MED ORDER — VANCOMYCIN HCL IN DEXTROSE 1-5 GM/200ML-% IV SOLN: 1000.0000 mg | INTRAVENOUS | Status: DC

## 2020-03-03 MED ORDER — CLINDAMYCIN PHOSPHATE 900 MG/50ML IV SOLN
INTRAVENOUS | Status: AC
  Administered 2020-03-03: 900 mg via INTRAVENOUS
  Filled 2020-03-03: qty 50

## 2020-03-03 MED ORDER — SODIUM CHLORIDE 0.9 % IV SOLN: INTRAVENOUS | Status: DC

## 2020-03-03 MED ORDER — CLINDAMYCIN PHOSPHATE 900 MG/50ML IV SOLN: 900.0000 mg | Freq: Once | INTRAVENOUS | Status: AC

## 2020-03-03 MED ORDER — LIDOCAINE-EPINEPHRINE 1 %-1:100000 IJ SOLN
INTRAMUSCULAR | Status: AC
  Filled 2020-03-03: qty 1

## 2020-03-03 MED ORDER — HEPARIN SOD (PORK) LOCK FLUSH 100 UNIT/ML IV SOLN
INTRAVENOUS | Status: AC
  Filled 2020-03-03: qty 5

## 2020-03-03 MED ORDER — FENTANYL CITRATE (PF) 100 MCG/2ML IJ SOLN
INTRAMUSCULAR | Status: DC
Start: 2020-03-03 — End: 2020-03-04
  Filled 2020-03-03: qty 2

## 2020-03-03 MED ORDER — HEPARIN SOD (PORK) LOCK FLUSH 100 UNIT/ML IV SOLN
INTRAVENOUS | Status: AC | PRN
  Administered 2020-03-03: 500 [IU] via INTRAVENOUS

## 2020-03-03 MED ORDER — LIDOCAINE HCL 1 % IJ SOLN
INTRAMUSCULAR | Status: AC
  Filled 2020-03-03: qty 20

## 2020-03-03 MED ORDER — MIDAZOLAM HCL 2 MG/2ML IJ SOLN
INTRAMUSCULAR | Status: AC
  Filled 2020-03-03: qty 4

## 2020-03-03 MED ORDER — FENTANYL CITRATE (PF) 100 MCG/2ML IJ SOLN
INTRAMUSCULAR | Status: AC | PRN
Start: 2020-03-03 — End: 2020-03-03
  Administered 2020-03-03 (×2): 50 ug via INTRAVENOUS

## 2020-03-03 NOTE — Progress Notes (Signed)
[facility] OFFICE PROGRESS NOTE  Jean Rosenthal, MD [address] [place] [id number]  DIAGNOSIS: Stage IV (TX, N2, M1 C) non-small cell lung cancer,adenocarcinomadiagnosed in August 2021 and presented with solitary brain metastasis in addition to mediastinal lymphadenopathy.  PDL1 Expression 70%  Molecular Biomarkers: No actionable mutations  PRIOR THERAPY: Status post right craniotomy with tumor resection followed by [facility] to solitary brain metastasis under the care of Dr. Lisbeth Renshaw and Dr. Sherral Hammers.  CURRENT THERAPY: Concurrent chemoradiation with weekly carboplatin for AUC of 2 and paclitaxel 45 mg/M2. First dose 02/01/2020. Status post 4 cycles.   INTERVAL HISTORY: Jared Shea. 64 y.o. male returns to the clinic today for a follow-up visit accompanied by his sister.  In the interval since his last appointment, the patient was seen in the emergency room twice for the chief complaint of abdominal pain.  During his first encounter on 02/24/2020, the patient had a CT scan of the abdomen and pelvis which did not show an etiology of his abdominal pain but did show large stool burden.  It was recommended that the patient take Maalox, PPI, and Carafate.  The patient was then seen in radiation oncology a few days later who reassess the radiation fields and did not feel that the radiation field included the stomach.  Was recommended that the patient take laxatives for her constipation as well as use his Carafate he was also given viscous lidocaine.  She did not receive his weekly dose of chemotherapy on 02/29/2020 due to his persistent symptoms.  The patient was then seen a few days later by radiation oncology on 03/02/2020.  The patient continued to feel unwell that day and was subsequently sent to the emergency room.  No etiology found on recent work-up. The patient denies any more abdominal pain at this time. He states his last bowel movement was on Saturday. He denies any  nausea or vomiting.  He continues to endorse odynophagia and dysphagia. He is only using his Carafate once a day. Unclear if he is using his viscous lidocaine. The patient is a poor historian and often replies to questions in one-word answers or grunting. He was evaluated by member nutritionist team in the past. He continues to lose weight and lost approximately 12 pounds since his last appointment. The patient is feeling lightheaded and dizzy today. He took his blood pressure medicine supposedly around 7 AM this morning as prescribed.   Also in the interval since his last appointment, the patient got a Port-A-Cath placed due to difficult IV access. The patient denies any fever, chills, or night sweats. His breathing is "good". Denies any chest pain, shortness of breath, or hemoptysis. Denies any headache or visual changes. The patient is here today for evaluation before starting his last cycle of chemotherapy. His last day radiation scheduled for 03/14/2020.   MEDICAL HISTORY: Past Medical History:  Diagnosis Date  . Brain mass   . Bursitis of right hip   . Chest pain 08/12/2018  . Chronic cough   . Dependence on nicotine from cigarettes   . Diabetes mellitus without complication ([place])   . Essential hypertension 07/28/2018  . Seizures ([place])   . Viral illness 03/23/2019    ALLERGIES:  is allergic to penicillins.  MEDICATIONS:  Current Outpatient Medications  Medication Sig Dispense Refill  . Accu-Chek Softclix Lancets lancets Use as instructed (Patient not taking: Reported on 02/22/2020) 100 each 12  . aluminum-magnesium hydroxide 200-200 MG/5ML suspension Take 10  mLs by mouth every 6 (six) hours as needed for indigestion. 355 mL 0  . blood glucose meter kit and supplies KIT Dispense based on patient and insurance preference. Use up to four times daily as directed. (FOR ICD-9 250.00, 250.01). (Patient not taking: Reported on 02/22/2020) 1 each 0  . chlorproMAZINE (THORAZINE) 25 MG tablet  Take 1 tablet (25 mg total) by mouth 3 (three) times daily as needed. (Patient not taking: Reported on 02/22/2020) 21 tablet 0  . fluconazole (DIFLUCAN) 100 MG tablet Take 1 tablet (100 mg total) by mouth daily. (Patient not taking: Reported on 02/22/2020) 10 tablet 0  . levETIRAcetam (KEPPRA) 750 MG tablet Take 1 tablet (750 mg total) by mouth 2 (two) times daily. 180 tablet 4  . lidocaine (XYLOCAINE) 2 % solution Use as directed 15 mLs in the mouth or throat every 4 (four) hours as needed for mouth pain. 100 mL 1  . lidocaine-prilocaine (EMLA) cream Apply 1 application topically as needed. (Patient taking differently: Apply 1 application topically. ) 30 g 0  . losartan-hydrochlorothiazide (HYZAAR) 50-12.5 MG tablet Take 1 tablet by mouth daily. 60 tablet 5  . metFORMIN (GLUCOPHAGE) 500 MG tablet Take 2 tablets (1,000 mg total) by mouth 2 (two) times daily with a meal. 120 tablet 2  . mirtazapine (REMERON) 15 MG tablet Take 1 tablet (15 mg total) by mouth at bedtime. 30 tablet 2  . nystatin (MYCOSTATIN) 100000 UNIT/ML suspension Take 5 mLs (500,000 Units total) by mouth 4 (four) times daily. (Patient not taking: Reported on 02/22/2020) 60 mL 0  . omeprazole (PRILOSEC) 20 MG capsule Take 1 capsule (20 mg total) by mouth daily. 30 capsule 2  . ondansetron (ZOFRAN ODT) 4 MG disintegrating tablet Take 1 tablet (4 mg total) by mouth every 8 (eight) hours as needed for nausea or vomiting. 20 tablet 0  . prochlorperazine (COMPAZINE) 10 MG tablet Take 1 tablet (10 mg total) by mouth every 6 (six) hours as needed. (Patient taking differently: Take 10 mg by mouth every 6 (six) hours as needed for nausea or vomiting. ) 30 tablet 2  . sucralfate (CARAFATE) 1 g tablet Take 1 tablet (1 g total) by mouth 4 (four) times daily. Dissolve each tablet in 15 cc water before use. (Patient not taking: Reported on 02/22/2020) 120 tablet 2   No current facility-administered medications for this visit.    SURGICAL HISTORY:   Past Surgical History:  Procedure Laterality Date  . APPLICATION OF CRANIAL NAVIGATION N/A 01/07/2020   Procedure: APPLICATION OF CRANIAL NAVIGATION;  Surgeon: Ashok Pall, MD;  Location: [facility];  Service: Neurosurgery;  Laterality: N/A;  . CRANIOTOMY Right 01/07/2020   Procedure: RIGHT CRANIOTOMY FOR TUMOR RESECTION;  Surgeon: Ashok Pall, MD;  Location: [facility];  Service: Neurosurgery;  Laterality: Right;  rm 21  . ENDOBRONCHIAL ULTRASOUND N/A 12/21/2019   Procedure: ENDOBRONCHIAL ULTRASOUND;  Surgeon: Laurin Coder, MD;  Location: [facility];  Service: Pulmonary;  Laterality: N/A;  . FINE NEEDLE ASPIRATION  12/21/2019   Procedure: FINE NEEDLE ASPIRATION (FNA) LINEAR;  Surgeon: Laurin Coder, MD;  Location: [facility];  Service: Pulmonary;;  . IR IMAGING GUIDED PORT INSERTION  03/03/2020  . NO PAST SURGERIES    . VIDEO BRONCHOSCOPY N/A 12/21/2019   Procedure: VIDEO BRONCHOSCOPY WITHOUT FLUORO;  Surgeon: Laurin Coder, MD;  Location: [facility];  Service: Pulmonary;  Laterality: N/A;    REVIEW OF SYSTEMS:   Review of Systems  Constitutional: Positive for fatigue, weight loss,  and appetite change. Negative for chills and fever. HENT: Positive for odynophagia and dysphagia. Negative for mouth sores, and nosebleeds.   Eyes: Negative for eye problems and icterus.  Respiratory: Negative for cough, hemoptysis, shortness of breath and wheezing.   Cardiovascular: Negative for chest pain and leg swelling.  Gastrointestinal: Positive for frequent constipation. Negative for abdominal pain, diarrhea, nausea and vomiting.  Genitourinary: Negative for bladder incontinence, difficulty urinating, dysuria, frequency and hematuria.   Musculoskeletal: Negative for back pain, gait problem, neck pain and neck stiffness.  Skin: Negative for itching and rash.  Neurological: Positive for lightheadedness/dizziness. Negative for extremity weakness, gait problem, headaches, and seizures.   Hematological: Negative for adenopathy. Does not bruise/bleed easily.  Psychiatric/Behavioral: Negative for confusion, depression and sleep disturbance. The patient is not nervous/anxious.     PHYSICAL EXAMINATION:  Blood pressure (!) 87/57, pulse (!) 52, temperature (!) 97 F (36.1 C), temperature source Tympanic, resp. rate 17, height 6' (1.829 m), weight 160 lb 14.4 oz (73 kg), SpO2 98 %.  ECOG PERFORMANCE STATUS: 1 - Symptomatic but completely ambulatory  Physical Exam  Constitutional: Oriented to person, place, and time and well-developed, well-nourished, and in no distress.   HENT:  Head: Normocephalic and atraumatic.  Mouth/Throat: Oropharynx is clear and moist. No oropharyngeal exudate.  Eyes: Conjunctivae are normal. Right eye exhibits no discharge. Left eye exhibits no discharge. No scleral icterus.  Neck: Normal range of motion. Neck supple.  Cardiovascular: Normal rate, regular rhythm, normal heart sounds and intact distal pulses.   Pulmonary/Chest: Effort normal and breath sounds normal. No respiratory distress. No wheezes. No rales.  Abdominal: Soft. Bowel sounds are normal. Exhibits no distension and no mass. Soreness to palpation across abdomen. Musculoskeletal: Normal range of motion. Exhibits no edema.  Lymphadenopathy:    No cervical adenopathy.  Neurological: Alert and oriented to person, place, and time. Exhibits normal muscle tone. Gait normal. Coordination normal.  Skin: Skin is warm and dry. No rash noted. Not diaphoretic. No erythema. No pallor.  Psychiatric: Mood, memory and judgment normal.  Vitals reviewed.  LABORATORY DATA: Lab Results  Component Value Date   WBC 5.3 03/07/2020   HGB 13.1 03/07/2020   HCT 36.3 (L) 03/07/2020   MCV 89.0 03/07/2020   PLT 188 03/07/2020      Chemistry      Component Value Date/Time   NA 139 03/07/2020 1003   NA 140 11/23/2019 1516   K 3.0 (LL) 03/07/2020 1003   CL 103 03/07/2020 1003   CO2 28 03/07/2020 1003    BUN 14 03/07/2020 1003   BUN 14 11/23/2019 1516   CREATININE 0.93 03/07/2020 1003      Component Value Date/Time   CALCIUM 9.6 03/07/2020 1003   ALKPHOS 77 03/07/2020 1003   AST 29 03/07/2020 1003   ALT 40 03/07/2020 1003   BILITOT 1.3 (H) 03/07/2020 1003       RADIOGRAPHIC STUDIES:  CT ABDOMEN PELVIS WO CONTRAST  Result Date: 02/24/2020 CLINICAL DATA:  64 year old male with right lower quadrant abdominal pain. EXAM: CT ABDOMEN AND PELVIS WITHOUT CONTRAST TECHNIQUE: Multidetector CT imaging of the abdomen and pelvis was performed following the standard protocol without IV contrast. COMPARISON:  CT dated 12/14/2019. FINDINGS: Evaluation of this exam is limited in the absence of intravenous contrast. Lower chest: Bibasilar linear and streaky atelectasis/scarring. The visualized lung bases are otherwise clear. No intra-abdominal free air or free fluid. Hepatobiliary: No focal liver abnormality is seen. No gallstones, gallbladder wall thickening, or biliary dilatation.  Pancreas: Unremarkable. No pancreatic ductal dilatation or surrounding inflammatory changes. Spleen: Normal in size without focal abnormality. Adrenals/Urinary Tract: The adrenal glands unremarkable. The kidneys, visualized ureters, and urinary bladder appear unremarkable. Stomach/Bowel: There is sigmoid diverticulosis without active inflammatory changes. There is no bowel obstruction or active inflammation. The appendix is normal. Vascular/Lymphatic: Moderate aortoiliac atherosclerotic disease. The IVC is unremarkable. No portal venous gas. There is no adenopathy. Reproductive: The prostate and seminal vesicles are grossly unremarkable. No pelvic mass. Other: None Musculoskeletal: Degenerative changes of the spine. No acute osseous pathology. IMPRESSION: 1. No acute intra-abdominal or pelvic pathology. Normal appendix. 2. Sigmoid diverticulosis. 3. Aortic Atherosclerosis (ICD10-I70.0). Electronically Signed   By: Anner Crete M.D.    On: 02/24/2020 21:19   IR IMAGING GUIDED PORT INSERTION  Result Date: 03/03/2020 CLINICAL DATA:  LUNG CANCER, ACCESS FOR CHEMOTHERAPY EXAM: RIGHT INTERNAL JUGULAR SINGLE LUMEN POWER PORT CATHETER INSERTION Date:  03/03/2020 03/03/2020 3:30 pm Radiologist:  M. Daryll Brod, MD Guidance:  ULTRASOUND AND FLUOROSCOPIC MEDICATIONS: 900 MG CLINDAMYCIN; The antibiotic was administered within an appropriate time interval prior to skin puncture. ANESTHESIA/SEDATION: Versed 2.0 mg IV; Fentanyl 100 mcg IV; Moderate Sedation Time:  23 minutes The patient was continuously monitored during the procedure by the interventional radiology nurse under my direct supervision. FLUOROSCOPY TIME:  0 minutes, 48 seconds (8 mGy) COMPLICATIONS: None immediate. CONTRAST:  None. PROCEDURE: Informed consent was obtained from the patient following explanation of the procedure, risks, benefits and alternatives. The patient understands, agrees and consents for the procedure. All questions were addressed. A time out was performed. Maximal barrier sterile technique utilized including caps, mask, sterile gowns, sterile gloves, large sterile drape, hand hygiene, and 2% chlorhexidine scrub. Under sterile conditions and local anesthesia, right internal jugular micropuncture venous access was performed. Access was performed with ultrasound. Images were obtained for documentation of the patent right internal jugular vein. A guide wire was inserted followed by a transitional dilator. This allowed insertion of a guide wire and catheter into the IVC. Measurements were obtained from the SVC / RA junction back to the right IJ venotomy site. In the right infraclavicular chest, a subcutaneous pocket was created over the second anterior rib. This was done under sterile conditions and local anesthesia. 1% lidocaine with epinephrine was utilized for this. A 2.5 cm incision was made in the skin. Blunt dissection was performed to create a subcutaneous pocket  over the right pectoralis major muscle. The pocket was flushed with saline vigorously. There was adequate hemostasis. The port catheter was assembled and checked for leakage. The port catheter was secured in the pocket with two retention sutures. The tubing was tunneled subcutaneously to the right venotomy site and inserted into the SVC/RA junction through a valved peel-away sheath. Position was confirmed with fluoroscopy. Images were obtained for documentation. The patient tolerated the procedure well. No immediate complications. Incisions were closed in a two layer fashion with 4 - 0 Vicryl suture. Dermabond was applied to the skin. The port catheter was accessed, blood was aspirated followed by saline and heparin flushes. Needle was removed. A dry sterile dressing was applied. IMPRESSION: Ultrasound and fluoroscopically guided right internal jugular single lumen power port catheter insertion. Tip in the SVC/RA junction. Catheter ready for use. Electronically Signed   By: Jerilynn Mages.  Shick M.D.   On: 03/03/2020 15:35     ASSESSMENT/PLAN:  This is a very pleasant 64 year old African-American male diagnosed with stage IV (Tx, N2, M1c)non-small cell lung cancer, adenocarcinoma.Hepresented with a solitary brain metastasis  in addition to right hilar and mediastinal lymphadenopathy. Hewas diagnosed in August 2021.   The patient is status post right craniotomy with resection of the solitary brain metastasis with SRS under the care of Dr. Lisbeth Renshaw and Dr. Christella Noa.  The patient is currently undergoing weekly concurrent chemoradiation with carboplatin for an AUC of 2 and paclitaxel 45 mg per metered squared. He is status post 4 cycles and tolerated it well except for some dysphagia/odynophagia secondary to radiation.    The patient has been experiencing significant abdominal cramping over the last several weeks. He denied abdominal pain today. No clear etiology on workup.   Patient was seen with Dr. Julien Nordmann  today.  Labs were reviewed.  Dr. Julien Nordmann recommends that he proceed with his his last cycle of treatment today as scheduled  The patient's last day radiation is scheduled for 03/14/2020.  I will arrange for restaging CT scan to be performed of the chest in 4 weeks.  We will see the patient back for follow-up visit in 4 weeks for evaluation and to review his restaging CT scan of the chest.  Regarding the patient's weight loss and poor appetite. The patient is only drinking at the most 1 Ensure a day. I rediscussed how to take his Carafate as he has only been taking this once a day. Encourage the patient to drink 2-3 ensures per day. The patient will receive 1 L of fluid today due to his hypotension and poor oral intake. The patient was also given a handout on soft food diet due to odynophagia. The patient's labs demonstrate hypokalemia today and he will receive 20 mEq IV today. I have also sent potassium supplements to his pharmacy.   The patient was instructed to schedule his ultrasound-guided biopsy of the thyroid nodule. This order was placed several weeks ago and the patient has not scheduled it at this point in time. Ideally, we would like the patient to have this done to review at his next follow-up appointment.  The patient was advised to call immediately if he has any concerning symptoms in the interval. The patient voices understanding of current disease status and treatment options and is in agreement with the current care plan. All questions were answered. The patient knows to call the clinic with any problems, questions or concerns. We can certainly see the patient much sooner if necessary      Orders Placed This Encounter  Procedures  . CT Chest W Contrast    Standing Status:   Future    Standing Expiration Date:   03/07/2021    Order Specific Question:   If indicated for the ordered procedure, I authorize the administration of contrast media per Radiology protocol    Answer:    Yes    Order Specific Question:   Preferred imaging location?    Answer:   [place], PA-C 03/07/20  ADDENDUM: Hematology/Oncology Attending: I had a face-to-face encounter with the patient today.  I recommended his care plan.  This is a 64 years old African-American male with stage IV (TX, N3, M1c), adenocarcinoma with solitary brain metastasis in addition to right hilar and mediastinal lymphadenopathy diagnosed in August 2021.  The patient underwent right craniotomy with resection of the solitary tumor followed by SRS to the resection cavity. He is currently undergoing a course of concurrent chemoradiation to the locally advanced disease and the chest with weekly carboplatin for AUC of 2 and paclitaxel 45 mg/M2 status post  4 cycles.  The patient has been tolerating this treatment well except for odynophagia secondary to radiation induced esophagitis.  He is currently on multiple medication to help with odynophagia including Carafate as well as Viscous Lidocaine and pain medication. I recommended for the patient to proceed with cycle #5 today as planned.  He is expected to complete the course of concurrent chemoradiation on March 14, 2020. We will see him back for follow-up visit in 4 weeks for evaluation with repeat CT scan of the chest for restaging of his disease. The patient was advised to call immediately if he has any concerning symptoms in the interval.  Disclaimer: This note was dictated with voice recognition software. Similar sounding words can inadvertently be transcribed and may be missed upon review. Eilleen Kempf, MD 03/07/20

## 2020-03-03 NOTE — Procedures (Signed)
Interventional Radiology Procedure Note  Procedure: RT IJ POWER PORT    Complications: None  Estimated Blood Loss:  MIN  Findings: TIP SVCRA    M. TREVOR Jehiel Koepp, MD    

## 2020-03-03 NOTE — Discharge Instructions (Signed)

## 2020-03-03 NOTE — H&P (Signed)
Chief Complaint: Patient was seen in consultation today for lung cancer/Port-a-cath placement.  Referring Physician(s): Heilingoetter,Cassandra L  Supervising Physician: Daryll Brod  Patient Status: [facility] - Out-pt  History of Present Illness: Jared Leh. is a 64 y.o. male with a past medical history of hypertension, seizures, stage IV lung cancer, diabetes mellitus, and tobacco abuse. He was unfortunately diagnosed with stage IV non-small cell lung cancer (with metastasis to brain) in 12/2019. His cancer is managed by [facility], PA-C and Dr. Julien Nordmann. He underwent right craniotomy with tumor resection in OR 01/07/2020. He is currently undergoing systemic chemotherapy as management, and has had difficulty with IV access.  IR consulted by [facility], PA-C for possible image-guided Port-a-cath placement. Patient awake and alert laying in bed with no complaints at this time. Denies fever, chills, chest pain, dyspnea, abdominal pain, or headache.   Past Medical History:  Diagnosis Date  . Brain mass   . Bursitis of right hip   . Chest pain 08/12/2018  . Chronic cough   . Dependence on nicotine from cigarettes   . Diabetes mellitus without complication ([place])   . Essential hypertension 07/28/2018  . Seizures ([place])   . Viral illness 03/23/2019    Past Surgical History:  Procedure Laterality Date  . APPLICATION OF CRANIAL NAVIGATION N/A 01/07/2020   Procedure: APPLICATION OF CRANIAL NAVIGATION;  Surgeon: Ashok Pall, MD;  Location: [place];  Service: Neurosurgery;  Laterality: N/A;  . CRANIOTOMY Right 01/07/2020   Procedure: RIGHT CRANIOTOMY FOR TUMOR RESECTION;  Surgeon: Ashok Pall, MD;  Location: [place];  Service: Neurosurgery;  Laterality: Right;  rm 21  . ENDOBRONCHIAL ULTRASOUND N/A 12/21/2019   Procedure: ENDOBRONCHIAL ULTRASOUND;  Surgeon: Laurin Coder, MD;  Location: [facility];  Service: Pulmonary;  Laterality: N/A;  . FINE NEEDLE ASPIRATION   12/21/2019   Procedure: FINE NEEDLE ASPIRATION (FNA) LINEAR;  Surgeon: Laurin Coder, MD;  Location: [facility];  Service: Pulmonary;;  . NO PAST SURGERIES    . VIDEO BRONCHOSCOPY N/A 12/21/2019   Procedure: VIDEO BRONCHOSCOPY WITHOUT FLUORO;  Surgeon: Laurin Coder, MD;  Location: [facility];  Service: Pulmonary;  Laterality: N/A;    Allergies: Penicillins  Medications: Prior to Admission medications   Medication Sig Start Date End Date Taking? Authorizing Provider  aluminum-magnesium hydroxide 200-200 MG/5ML suspension Take 10 mLs by mouth every 6 (six) hours as needed for indigestion. 02/24/20  Yes Matilde Haymaker, MD  levETIRAcetam (KEPPRA) 750 MG tablet Take 1 tablet (750 mg total) by mouth 2 (two) times daily. 12/23/19  Yes Marcial Pacas, MD  losartan-hydrochlorothiazide (HYZAAR) 50-12.5 MG tablet Take 1 tablet by mouth daily. 10/13/19  Yes Jean Rosenthal, MD  metFORMIN (GLUCOPHAGE) 500 MG tablet Take 2 tablets (1,000 mg total) by mouth 2 (two) times daily with a meal. 01/05/20 04/04/20 Yes Jose Persia, MD  mirtazapine (REMERON) 15 MG tablet Take 1 tablet (15 mg total) by mouth at bedtime. 02/22/20  Yes Heilingoetter, Cassandra L, PA-C  omeprazole (PRILOSEC) 20 MG capsule Take 1 capsule (20 mg total) by mouth daily. 02/22/20  Yes Heilingoetter, Cassandra L, PA-C  Accu-Chek Softclix Lancets lancets Use as instructed Patient not taking: Reported on 02/22/2020 01/21/20   Gaylan Gerold, DO  blood glucose meter kit and supplies KIT Dispense based on patient and insurance preference. Use up to four times daily as directed. (FOR ICD-9 250.00, 250.01). Patient not taking: Reported on 02/22/2020 01/14/20   Jean Rosenthal, MD  chlorproMAZINE (THORAZINE) 25 MG tablet Take  1 tablet (25 mg total) by mouth 3 (three) times daily as needed. Patient not taking: Reported on 02/22/2020 12/24/19   Laurin Coder, MD  fluconazole (DIFLUCAN) 100 MG tablet Take 1 tablet (100 mg total) by mouth  daily. Patient not taking: Reported on 02/22/2020 01/20/20   Curt Bears, MD  lidocaine (XYLOCAINE) 2 % solution Use as directed 15 mLs in the mouth or throat every 4 (four) hours as needed for mouth pain. 02/29/20   Hayden Pedro, PA-C  lidocaine-prilocaine (EMLA) cream Apply 1 application topically as needed. Patient taking differently: Apply 1 application topically.  02/22/20   Heilingoetter, Cassandra L, PA-C  nystatin (MYCOSTATIN) 100000 UNIT/ML suspension Take 5 mLs (500,000 Units total) by mouth 4 (four) times daily. Patient not taking: Reported on 02/22/2020 01/27/20   Hayden Pedro, PA-C  ondansetron (ZOFRAN ODT) 4 MG disintegrating tablet Take 1 tablet (4 mg total) by mouth every 8 (eight) hours as needed for nausea or vomiting. 03/02/20   Joy, Shawn C, PA-C  prochlorperazine (COMPAZINE) 10 MG tablet Take 1 tablet (10 mg total) by mouth every 6 (six) hours as needed. Patient taking differently: Take 10 mg by mouth every 6 (six) hours as needed for nausea or vomiting.  01/14/20   Jean Rosenthal, MD  sucralfate (CARAFATE) 1 g tablet Take 1 tablet (1 g total) by mouth 4 (four) times daily. Dissolve each tablet in 15 cc water before use. Patient not taking: Reported on 02/22/2020 02/19/20   Kyung Rudd, MD     Family History  Problem Relation Age of Onset  . Heart disease Mother        CABG  . Kidney disease Sister   . Diabetes Brother   . Kidney disease Brother        KIDNEY TRANSPLANT  . Hypertension Sister   . Healthy Daughter   . Healthy Daughter   . Healthy Son     Social History   Socioeconomic History  . Marital status: Divorced    Spouse name: Not on file  . Number of children: Not on file  . Years of education: Not on file  . Highest education level: Not on file  Occupational History  . Not on file  Tobacco Use  . Smoking status: Former Smoker    Packs/day: 1.00    Years: 48.00    Pack years: 48.00    Types: Cigarettes    Start date:  08/07/1970    Quit date: 12/15/2019    Years since quitting: 0.2  . Smokeless tobacco: Never Used  Vaping Use  . Vaping Use: Never used  Substance and Sexual Activity  . Alcohol use: Not Currently  . Drug use: Never  . Sexual activity: Not on file    Comment: YES  Other Topics Concern  . Not on file  Social History Narrative  . Not on file   Social Determinants of Health   Financial Resource Strain: High Risk  . Difficulty of Paying Living Expenses: Hard  Food Insecurity: No Food Insecurity  . Worried About Charity fundraiser in the Last Year: Never true  . Ran Out of Food in the Last Year: Never true  Transportation Needs: Unmet Transportation Needs  . Lack of Transportation (Medical): Yes  . Lack of Transportation (Non-Medical): Yes  Physical Activity:   . Days of Exercise per Week: Not on file  . Minutes of Exercise per Session: Not on file  Stress:   . Feeling of Stress :  Not on file  Social Connections:   . Frequency of Communication with Friends and Family: Not on file  . Frequency of Social Gatherings with Friends and Family: Not on file  . Attends Religious Services: Not on file  . Active Member of Clubs or Organizations: Not on file  . Attends Archivist Meetings: Not on file  . Marital Status: Not on file     Review of Systems: A 12 point ROS discussed and pertinent positives are indicated in the HPI above.  All other systems are negative.  Review of Systems  Constitutional: Negative for chills and fever.  Respiratory: Negative for shortness of breath and wheezing.   Cardiovascular: Negative for chest pain and palpitations.  Gastrointestinal: Negative for abdominal pain.  Neurological: Negative for headaches.  Psychiatric/Behavioral: Negative for behavioral problems and confusion.    Vital Signs: BP 109/62   Pulse 81   Temp 97.7 F (36.5 C) (Oral)   Resp 18   SpO2 96%   Physical Exam Vitals and nursing note reviewed.  Constitutional:       General: He is not in acute distress. Cardiovascular:     Rate and Rhythm: Normal rate and regular rhythm.     Heart sounds: Normal heart sounds. No murmur heard.   Pulmonary:     Effort: Pulmonary effort is normal. No respiratory distress.     Breath sounds: Normal breath sounds. No wheezing.  Skin:    General: Skin is warm and dry.  Neurological:     Mental Status: He is alert and oriented to person, place, and time.      MD Evaluation Airway: WNL Heart: WNL Abdomen: WNL Chest/ Lungs: WNL ASA  Classification: 3 Mallampati/Airway Score: Two   Imaging: CT ABDOMEN PELVIS WO CONTRAST  Result Date: 02/24/2020 CLINICAL DATA:  64 year old male with right lower quadrant abdominal pain. EXAM: CT ABDOMEN AND PELVIS WITHOUT CONTRAST TECHNIQUE: Multidetector CT imaging of the abdomen and pelvis was performed following the standard protocol without IV contrast. COMPARISON:  CT dated 12/14/2019. FINDINGS: Evaluation of this exam is limited in the absence of intravenous contrast. Lower chest: Bibasilar linear and streaky atelectasis/scarring. The visualized lung bases are otherwise clear. No intra-abdominal free air or free fluid. Hepatobiliary: No focal liver abnormality is seen. No gallstones, gallbladder wall thickening, or biliary dilatation. Pancreas: Unremarkable. No pancreatic ductal dilatation or surrounding inflammatory changes. Spleen: Normal in size without focal abnormality. Adrenals/Urinary Tract: The adrenal glands unremarkable. The kidneys, visualized ureters, and urinary bladder appear unremarkable. Stomach/Bowel: There is sigmoid diverticulosis without active inflammatory changes. There is no bowel obstruction or active inflammation. The appendix is normal. Vascular/Lymphatic: Moderate aortoiliac atherosclerotic disease. The IVC is unremarkable. No portal venous gas. There is no adenopathy. Reproductive: The prostate and seminal vesicles are grossly unremarkable. No pelvic mass.  Other: None Musculoskeletal: Degenerative changes of the spine. No acute osseous pathology. IMPRESSION: 1. No acute intra-abdominal or pelvic pathology. Normal appendix. 2. Sigmoid diverticulosis. 3. Aortic Atherosclerosis (ICD10-I70.0). Electronically Signed   By: Anner Crete M.D.   On: 02/24/2020 21:19    Labs:  CBC: Recent Labs    02/08/20 0903 02/15/20 1000 02/22/20 0854 03/02/20 1358  WBC 7.1 6.3 5.4 5.7  HGB 12.2* 12.7* 13.3 12.9*  HCT 35.1* 36.3* 37.0* 36.5*  PLT 417* 370 298 217    COAGS: Recent Labs    12/14/19 1419  INR 1.1  APTT 35    BMP: Recent Labs    01/04/20 0856 01/07/20 1139 02/01/20 0930  02/01/20 0930 02/08/20 0903 02/15/20 1000 02/22/20 0854 03/02/20 1358  NA   < >  --  138   < > 138 137 140 140  K   < >  --  4.0   < > 3.7 4.2 3.5 4.0  CL   < >  --  107   < > 106 105 105 102  CO2   < >  --  25   < > _0 GLUCOSE   < >  --  98   < > 143* 105* 127* 95  BUN   < >  --  11   < > _1 CALCIUM   < >  --  9.0   < > 9.3 9.5 9.8 9.5  CREATININE   < > 0.77 0.70   < > 0.74 0.77 0.79 0.77  GFRNONAA   < > >60 >60   < > >60 >60 >60 >60  GFRAA  --  >60 >60  --  >60 >60  --   --    < > = values in this interval not displayed.    LIVER FUNCTION TESTS: Recent Labs    02/08/20 0903 02/15/20 1000 02/22/20 0854 03/02/20 1358  BILITOT 0.4 0.9 1.1 1.5*  AST 26 32 27 34  ALT 40 50* 41 45*  ALKPHOS 76 77 80 65  PROT 7.1 7.4 7.6 7.3  ALBUMIN 3.5 3.8 4.0 4.3     Assessment and Plan:  Stage IV non-small cell lung cancer, currently undergoing systemic chemotherapy as management, with poor venous access. Plan for image-guided Port-a-cath placement today in IR. Patient is NPO. Afebrile. He does not take blood thinners.  Risks and benefits of image-guided Port-a-catheter placement were discussed with the patient including, but not limited to bleeding, infection, pneumothorax, or fibrin sheath development and need for additional  procedures. All of the patient's questions were answered, patient is agreeable to proceed. Consent signed and in chart.   Thank you for this interesting consult.  I greatly enjoyed meeting Rushawn Capshaw. and look forward to participating in their care.  A copy of this report was sent to the requesting provider on this date.  Electronically Signed: Earley Abide, PA-C 03/03/2020, 1:50 PM   I spent a total of 30 Minutes in face to face in clinical consultation, greater than 50% of which was counseling/coordinating care for lung cancer/Port-a-cath placement.

## 2020-03-03 NOTE — Progress Notes (Signed)
0800- 0.5 mg ativan wasted in stericycle, witnessed by Noland Fordyce.  Gloriajean Dell. Leonie Green, BSN

## 2020-03-04 ENCOUNTER — Ambulatory Visit
Admission: RE | Admit: 2020-03-04 | Discharge: 2020-03-04 | Disposition: A | Discharge status: Home / Self Care | Payer: Medicare HMO | Source: Ambulatory Visit | Attending: Radiation Oncology | Admitting: Radiation Oncology

## 2020-03-04 DIAGNOSIS — Z510 Encounter for antineoplastic radiation therapy: Secondary | ICD-10-CM | POA: Diagnosis not present

## 2020-03-07 ENCOUNTER — Inpatient Hospital Stay: Payer: Medicare HMO

## 2020-03-07 ENCOUNTER — Other Ambulatory Visit: Payer: Self-pay

## 2020-03-07 ENCOUNTER — Telehealth: Payer: Self-pay | Admitting: *Deleted

## 2020-03-07 ENCOUNTER — Encounter: Payer: Self-pay | Admitting: Physician Assistant

## 2020-03-07 ENCOUNTER — Ambulatory Visit
Admission: RE | Admit: 2020-03-07 | Discharge: 2020-03-07 | Disposition: A | Discharge status: Home / Self Care | Payer: Medicare HMO | Source: Ambulatory Visit | Attending: Radiation Oncology | Admitting: Radiation Oncology

## 2020-03-07 ENCOUNTER — Ambulatory Visit: Payer: Medicare HMO

## 2020-03-07 ENCOUNTER — Inpatient Hospital Stay (HOSPITAL_BASED_OUTPATIENT_CLINIC_OR_DEPARTMENT_OTHER): Payer: Medicare HMO | Admitting: Physician Assistant

## 2020-03-07 VITALS — BP 95/71 | HR 111 | Temp 98.7°F | Resp 18

## 2020-03-07 VITALS — BP 87/57 | HR 52 | Temp 97.0°F | Resp 17 | Ht 72.0 in | Wt 160.9 lb

## 2020-03-07 DIAGNOSIS — Z5111 Encounter for antineoplastic chemotherapy: Secondary | ICD-10-CM | POA: Diagnosis not present

## 2020-03-07 DIAGNOSIS — Z510 Encounter for antineoplastic radiation therapy: Secondary | ICD-10-CM | POA: Diagnosis not present

## 2020-03-07 DIAGNOSIS — E876 Hypokalemia: Secondary | ICD-10-CM | POA: Diagnosis not present

## 2020-03-07 DIAGNOSIS — C3490 Malignant neoplasm of unspecified part of unspecified bronchus or lung: Secondary | ICD-10-CM

## 2020-03-07 DIAGNOSIS — I959 Hypotension, unspecified: Secondary | ICD-10-CM | POA: Insufficient documentation

## 2020-03-07 DIAGNOSIS — I951 Orthostatic hypotension: Secondary | ICD-10-CM | POA: Insufficient documentation

## 2020-03-07 DIAGNOSIS — Z95828 Presence of other vascular implants and grafts: Secondary | ICD-10-CM

## 2020-03-07 DIAGNOSIS — C799 Secondary malignant neoplasm of unspecified site: Secondary | ICD-10-CM

## 2020-03-07 LAB — CMP (CANCER CENTER ONLY)
ALT: 40 U/L (ref 0–44)
AST: 29 U/L (ref 15–41)
Albumin: 4.2 g/dL (ref 3.5–5.0)
Alkaline Phosphatase: 77 U/L (ref 38–126)
Anion gap: 8 (ref 5–15)
BUN: 14 mg/dL (ref 8–23)
CO2: 28 mmol/L (ref 22–32)
Calcium: 9.6 mg/dL (ref 8.9–10.3)
Chloride: 103 mmol/L (ref 98–111)
Creatinine: 0.93 mg/dL (ref 0.61–1.24)
GFR, Estimated: >60 mL/min (ref >60)
Glucose, Bld: 105 mg/dL — ABNORMAL HIGH (ref 70–99)
Potassium: 3 mmol/L — CL (ref 3.5–5.1)
Sodium: 139 mmol/L (ref 135–145)
Total Bilirubin: 1.3 mg/dL — ABNORMAL HIGH (ref 0.3–1.2)
Total Protein: 7.6 g/dL (ref 6.5–8.1)

## 2020-03-07 LAB — CBC WITH DIFFERENTIAL (CANCER CENTER ONLY)
Abs Immature Granulocytes: 0.04 10*3/uL (ref 0.00–0.07)
Basophils Absolute: 0 10*3/uL (ref 0.0–0.1)
Basophils Relative: 0 %
Eosinophils Absolute: 0 10*3/uL (ref 0.0–0.5)
Eosinophils Relative: 0 %
HCT: 36.3 % — ABNORMAL LOW (ref 39.0–52.0)
Hemoglobin: 13.1 g/dL (ref 13.0–17.0)
Immature Granulocytes: 1 %
Lymphocytes Relative: 12 %
Lymphs Abs: 0.7 10*3/uL (ref 0.7–4.0)
MCH: 32.1 pg (ref 26.0–34.0)
MCHC: 36.1 g/dL — ABNORMAL HIGH (ref 30.0–36.0)
MCV: 89 fL (ref 80.0–100.0)
Monocytes Absolute: 0.5 10*3/uL (ref 0.1–1.0)
Monocytes Relative: 10 %
Neutro Abs: 4.1 10*3/uL (ref 1.7–7.7)
Neutrophils Relative %: 77 %
Platelet Count: 188 10*3/uL (ref 150–400)
RBC: 4.08 MIL/uL — ABNORMAL LOW (ref 4.22–5.81)
RDW: 16.5 % — ABNORMAL HIGH (ref 11.5–15.5)
WBC Count: 5.3 10*3/uL (ref 4.0–10.5)
nRBC: 0 % (ref 0.0–0.2)

## 2020-03-07 MED ORDER — SODIUM CHLORIDE 0.9 % IV SOLN
20.0000 mg | Freq: Once | INTRAVENOUS | Status: AC
  Administered 2020-03-07: 20 mg via INTRAVENOUS
  Filled 2020-03-07: qty 20
  Filled 2020-03-07: qty 2

## 2020-03-07 MED ORDER — SODIUM CHLORIDE 0.9% FLUSH
10.0000 mL | INTRAVENOUS | Status: DC | PRN
  Filled 2020-03-07: qty 10

## 2020-03-07 MED ORDER — SODIUM CHLORIDE 0.9 % IV SOLN
Freq: Once | INTRAVENOUS | Status: DC
  Filled 2020-03-07: qty 250

## 2020-03-07 MED ORDER — DIPHENHYDRAMINE HCL 50 MG/ML IJ SOLN
INTRAMUSCULAR | Status: AC
  Filled 2020-03-07: qty 1

## 2020-03-07 MED ORDER — DIPHENHYDRAMINE HCL 50 MG/ML IJ SOLN
50.0000 mg | Freq: Once | INTRAMUSCULAR | Status: AC
  Administered 2020-03-07: 50 mg via INTRAVENOUS

## 2020-03-07 MED ORDER — PALONOSETRON HCL INJECTION 0.25 MG/5ML
0.2500 mg | Freq: Once | INTRAVENOUS | Status: AC
  Administered 2020-03-07: 0.25 mg via INTRAVENOUS

## 2020-03-07 MED ORDER — POTASSIUM CHLORIDE CRYS ER 20 MEQ PO TBCR: 20.0000 meq | EXTENDED_RELEASE_TABLET | Freq: Every day | ORAL | 0 refills | Status: DC

## 2020-03-07 MED ORDER — FAMOTIDINE IN NACL 20-0.9 MG/50ML-% IV SOLN
INTRAVENOUS | Status: AC
  Filled 2020-03-07: qty 50

## 2020-03-07 MED ORDER — SODIUM CHLORIDE 0.9% FLUSH
10.0000 mL | Freq: Once | INTRAVENOUS | Status: AC
  Administered 2020-03-07: 10 mL
  Filled 2020-03-07: qty 10

## 2020-03-07 MED ORDER — SODIUM CHLORIDE 0.9 % IV SOLN
45.0000 mg/m2 | Freq: Once | INTRAVENOUS | Status: AC
  Administered 2020-03-07: 90 mg via INTRAVENOUS
  Filled 2020-03-07: qty 15

## 2020-03-07 MED ORDER — POTASSIUM CHLORIDE 10 MEQ/100ML IV SOLN
10.0000 meq | INTRAVENOUS | Status: AC
  Administered 2020-03-07 (×2): 10 meq via INTRAVENOUS

## 2020-03-07 MED ORDER — SODIUM CHLORIDE 0.9 % IV SOLN
Freq: Once | INTRAVENOUS | Status: AC
  Filled 2020-03-07: qty 250

## 2020-03-07 MED ORDER — SODIUM CHLORIDE 0.9 % IV SOLN
216.8000 mg | Freq: Once | INTRAVENOUS | Status: AC
  Administered 2020-03-07: 220 mg via INTRAVENOUS
  Filled 2020-03-07: qty 22

## 2020-03-07 MED ORDER — HEPARIN SOD (PORK) LOCK FLUSH 100 UNIT/ML IV SOLN
500.0000 [IU] | Freq: Once | INTRAVENOUS | Status: DC | PRN
  Filled 2020-03-07: qty 5

## 2020-03-07 MED ORDER — POTASSIUM CHLORIDE 10 MEQ/100ML IV SOLN
INTRAVENOUS | Status: AC
  Filled 2020-03-07: qty 200

## 2020-03-07 MED ORDER — PALONOSETRON HCL INJECTION 0.25 MG/5ML
INTRAVENOUS | Status: AC
  Filled 2020-03-07: qty 5

## 2020-03-07 MED ORDER — FAMOTIDINE IN NACL 20-0.9 MG/50ML-% IV SOLN
20.0000 mg | Freq: Once | INTRAVENOUS | Status: AC
  Administered 2020-03-07: 20 mg via INTRAVENOUS

## 2020-03-07 MED ORDER — SODIUM CHLORIDE 0.9 % IV SOLN
INTRAVENOUS | Status: DC
  Filled 2020-03-07: qty 250

## 2020-03-07 NOTE — Patient Instructions (Signed)
Soft-Food Eating Plan A soft-food eating plan includes foods that are safe and easy to chew and swallow. Your health care provider or dietitian can help you find foods and flavors that fit into this plan. Follow this plan until your health care provider or dietitian says it is safe to start eating other foods and food textures. What are tips for following this plan? General guidelines   Take small bites of food, or cut food into pieces about  inch or smaller. Bite-sized pieces of food are easier to chew and swallow.  Eat moist foods. Avoid overly dry foods.  Avoid foods that: ? Are difficult to swallow, such as dry, chunky, crispy, or sticky foods. ? Are difficult to chew, such as hard, tough, or stringy foods. ? Contain nuts, seeds, or fruits.  Follow instructions from your dietitian about the types of liquids that are safe for you to swallow. You may be allowed to have: ? Thick liquids only. This includes only liquids that are thicker than honey. ? Thin and thick liquids. This includes all beverages and foods that become liquid at room temperature.  To make thick liquids: ? Purchase a commercial liquid thickening powder. These are available at grocery stores and pharmacies. ? Mix the thickener into liquids according to instructions on the label. ? Purchase ready-made thickened liquids. ? Thicken soup by pureeing, straining to remove chunks, and adding flour, potato flakes, or corn starch. ? Add commercial thickener to foods that become liquid at room temperature, such as milk shakes, yogurt, ice cream, gelatin, and sherbet.  Ask your health care provider whether you need to take a fiber supplement. Cooking  Cook meats so they stay tender and moist. Use methods like braising, stewing, or baking in liquid.  Cook vegetables and fruit until they are soft enough to be mashed with a fork.  Peel soft, fresh fruits such as peaches, nectarines, and melons.  When making soup, make sure  chunks of meat and vegetables are smaller than  inch.  Reheat leftover foods slowly so that a tough crust does not form. What foods are allowed? The items listed below may not be a complete list. Talk with your dietitian about what dietary choices are best for you. Grains Breads, muffins, pancakes, or waffles moistened with syrup, jelly, or butter. Dry cereals well-moistened with milk. Moist, cooked cereals. Well-cooked pasta and rice. Vegetables All soft-cooked vegetables. Shredded lettuce. Fruits All canned and cooked fruits. Soft, peeled fresh fruits. Strawberries. Dairy Milk. Cream. Yogurt. Cottage cheese. Soft cheese without the rind. Meats and other protein foods Tender, moist ground meat, poultry, or fish. Meat cooked in gravy or sauces. Eggs. Sweets and desserts Ice cream. Milk shakes. Sherbet. Pudding. Fats and oils Butter. Margarine. Olive, canola, sunflower, and grapeseed oil. Smooth salad dressing. Smooth cream cheese. Mayonnaise. Gravy. What foods are not allowed? The items listed bemay not be a complete list. Talk with your dietitian about what dietary choices are best for you. Grains Coarse or dry cereals, such as bran, granola, and shredded wheat. Tough or chewy crusty breads, such as French bread or baguettes. Breads with nuts, seeds, or fruit. Vegetables All raw vegetables. Cooked corn. Cooked vegetables that are tough or stringy. Tough, crisp, fried potatoes and potato skins. Fruits Fresh fruits with skins or seeds, or both, such as apples, pears, and grapes. Stringy, high-pulp fruits, such as papaya, pineapple, coconut, and mango. Fruit leather and all dried fruit. Dairy Yogurt with nuts or coconut. Meats and other protein foods Hard,   dry sausages. Dry meat, poultry, or fish. Meats with gristle. Fish with bones. Fried meat or fish. Lunch meat and hotdogs. Nuts and seeds. Chunky peanut butter or other nut butters. Sweets and desserts Cakes or cookies that are very  dry or chewy. Desserts with dried fruit, nuts, or coconut. Fried pastries. Very rich pastries. Fats and oils Cream cheese with fruit or nuts. Salad dressings with seeds or chunks. Summary  A soft-food eating plan includes foods that are safe and easy to swallow. Generally, the foods should be soft enough to be mashed with a fork.  Avoid foods that are dry, hard to chew, crunchy, sticky, stringy, or crispy.  Ask your health care provider whether you need to thicken your liquids and if you need to take a fiber supplement. This information is not intended to replace advice given to you by your health care provider. Make sure you discuss any questions you have with your health care provider. Document Revised: 08/21/2018 Document Reviewed: 07/03/2016 Elsevier Patient Education  2020 Elsevier Inc.  

## 2020-03-07 NOTE — Patient Instructions (Signed)

## 2020-03-07 NOTE — Telephone Encounter (Signed)
Patient's sister walked in stating patient is currently at [facility] receiving treatment. BPs have been running low: 87/57 and 95/71. [facility] is currently giving him fluids as well. Sister states patient will not eat or drink no matter how much she encourages him to do so. He has lost 11 lbs in the last 12 days. Patient has told her his throat and esophagus are painful 2/2 radiation treatments. Patient has appt tomorrow at 1500 with [facility]. Sister advised to have him drink as much fluids (water) as he can. Will forward to [facility] for any further recommendions at this time. Hubbard Hartshorn, BSN, RN-BC

## 2020-03-07 NOTE — Patient Instructions (Signed)
[facility] Discharge Instructions for Patients Receiving Chemotherapy  Today you received the following chemotherapy agents carboplatin, paclitaxel.  To help prevent nausea and vomiting after your treatment, we encourage you to take your nausea medication as directed.    If you develop nausea and vomiting that is not controlled by your nausea medication, call the clinic.   BELOW ARE SYMPTOMS THAT SHOULD BE REPORTED IMMEDIATELY:  *FEVER GREATER THAN 100.5 F  *CHILLS WITH OR WITHOUT FEVER  NAUSEA AND VOMITING THAT IS NOT CONTROLLED WITH YOUR NAUSEA MEDICATION  *UNUSUAL SHORTNESS OF BREATH  *UNUSUAL BRUISING OR BLEEDING  TENDERNESS IN MOUTH AND THROAT WITH OR WITHOUT PRESENCE OF ULCERS  *URINARY PROBLEMS  *BOWEL PROBLEMS  UNUSUAL RASH Items with * indicate a potential emergency and should be followed up as soon as possible.  Feel free to call the clinic should you have any questions or concerns. The clinic phone number is [phone].  Please show the CHEMO ALERT CARD at check-in to the Emergency Department and triage nurse.   

## 2020-03-08 ENCOUNTER — Encounter: Payer: Self-pay | Admitting: Student

## 2020-03-08 ENCOUNTER — Ambulatory Visit (INDEPENDENT_AMBULATORY_CARE_PROVIDER_SITE_OTHER): Payer: Medicare HMO | Admitting: Student

## 2020-03-08 ENCOUNTER — Ambulatory Visit: Payer: Medicare HMO

## 2020-03-08 ENCOUNTER — Other Ambulatory Visit: Payer: Self-pay

## 2020-03-08 ENCOUNTER — Ambulatory Visit
Admission: RE | Admit: 2020-03-08 | Discharge: 2020-03-08 | Disposition: A | Discharge status: Home / Self Care | Payer: Medicare HMO | Source: Ambulatory Visit | Attending: Radiation Oncology | Admitting: Radiation Oncology

## 2020-03-08 VITALS — BP 109/80 | HR 85 | Temp 98.0°F | Ht 72.0 in | Wt 167.0 lb

## 2020-03-08 DIAGNOSIS — I951 Orthostatic hypotension: Secondary | ICD-10-CM | POA: Diagnosis not present

## 2020-03-08 DIAGNOSIS — E041 Nontoxic single thyroid nodule: Secondary | ICD-10-CM | POA: Diagnosis not present

## 2020-03-08 DIAGNOSIS — E119 Type 2 diabetes mellitus without complications: Secondary | ICD-10-CM | POA: Diagnosis not present

## 2020-03-08 DIAGNOSIS — C3490 Malignant neoplasm of unspecified part of unspecified bronchus or lung: Secondary | ICD-10-CM | POA: Diagnosis not present

## 2020-03-08 DIAGNOSIS — E876 Hypokalemia: Secondary | ICD-10-CM

## 2020-03-08 DIAGNOSIS — G40209 Localization-related (focal) (partial) symptomatic epilepsy and epileptic syndromes with complex partial seizures, not intractable, without status epilepticus: Secondary | ICD-10-CM

## 2020-03-08 DIAGNOSIS — Z510 Encounter for antineoplastic radiation therapy: Secondary | ICD-10-CM | POA: Diagnosis not present

## 2020-03-08 DIAGNOSIS — F17200 Nicotine dependence, unspecified, uncomplicated: Secondary | ICD-10-CM

## 2020-03-08 DIAGNOSIS — F4321 Adjustment disorder with depressed mood: Secondary | ICD-10-CM

## 2020-03-08 NOTE — Assessment & Plan Note (Signed)
Patient was initially diagnosed with partial symptomatic seizures in August 2021 in the setting of metastasis of lung adenocarcinoma to the brain. He is s/p R craniotomy and tumor resection on chemoradiation without recurrence of seizures.  - Continue Keppra 750mg  twice daily

## 2020-03-08 NOTE — Assessment & Plan Note (Signed)
Potassium on yesterday's lab draw was 3.0. Patient was given 4mEq KCl and started on potassium supplementation. Associated constipation recently has resolved.  - Continue KCl 35mEq daily and continue to monitor

## 2020-03-08 NOTE — Assessment & Plan Note (Signed)
Patient endorses feeling down, depressed, and hopeless with anhedonia, fatigue, generalized weakness, and decreased appetite since starting chemoradiation. He denies any excessive guilt or feelings of worthlessness, difficulty concentrating, or suicidal ideation or plan. He is frustrated that he feels tired and sleeps a great amount of the day and expresses interest in counseling and joining a support group, stating he "hasn't talked with anyone going through what he has gone through". He was referred to [facility] at previous visit and has recently been started on Mirtazapine 15mg  nightly.  - Continue Mirtazapine 15mg  nightly despite fatigue for now, given his fatigue is likely in the setting of concurrent chemoradiation, with hopes of improved appetite stimulation - Will reach out to cancer team to see if they have resources for support groups and counseling.

## 2020-03-08 NOTE — Assessment & Plan Note (Signed)
Patient previously smoked cigarettes for >35 years; however, with his diagnosis of lung cancer, has made the decision to stop smoking and is doing well with this.  - Continue motivational interviewing.

## 2020-03-08 NOTE — Assessment & Plan Note (Signed)
Patient has a history of HTN and has been taking Hyzaar 50-12.5mg  daily although has been referred today for hypotension in the setting of poor PO intake and new diagnosis of malignant cancer. BP today 109/80. Patient states he is able to drink fluids and eat soft foods okay. - Discontinue Hyzaar - Continue to monitor

## 2020-03-08 NOTE — Assessment & Plan Note (Addendum)
Patient was diagnosed with Stage IV (TX, N2, M1c) adenocarcinoma of the lung with mediastinal lymphadenopathy and solitary brain metastasis s/p R craniotomy, tumor resection, and SRS, also found to have 1.3cm TI-RADS category 4 nodule of the right mid-gland (without FNA to date) currently finishing his last round of chemotherapy with scheduled last dose of radiation on 03/14/20. He has struggled with decreased appetite vs. Dysphagia, possibly secondary to adjustment disorder with depressed mood vs. Radiation vs. Other recent abdominal pain and altered bowel habits since starting his chemoradiation, with significant weight loss. CT scan 02/24/20 was unremarkable and his constipation resolved on stool softeners. He denies any choking episodes, odynophagia, worsening headache, new focal numbness or weakness, vision changes, hemoptysis, bloody or dark stools, or any other symptoms except as noted in HPI. He is hopeful with his therapy and wishes to continue with therapy as planned aside from FNA biopsy, which he wishes to postpone until finding out how well chemo and radiation have worked.    - [place] Follow-up - Encouraged continued PO intake as tolerated and emphasized usefulness of Ensure and protein drinks in these cases - Instructed patient to call vs. Go to ED if he experiences true dysphagia / choking sensations for possible EGD. - Will hold off on further attempt to schedule FNA biopsy until patient follows up with cancer center to assess response to treatment - Continue Maalox, Carafate, PPI, Potassium supplementation  - Hold stool softeners if >1 stool per day to avoid further dehydration.

## 2020-03-08 NOTE — Assessment & Plan Note (Signed)
Patient's Hb A1c in August 2021 was 7.6. He currently takes Metformin 1g twice daily and no other diabetic medications.  - Given his diarrhea, nausea, and weight loss, will discontinue Metformin

## 2020-03-08 NOTE — Assessment & Plan Note (Signed)
>>  ASSESSMENT AND PLAN FOR UNCONTROLLED TYPE 2 DIABETES MELLITUS WITH HYPERGLYCEMIA, WITHOUT LONG-TERM CURRENT USE OF INSULIN  ([facility]) WRITTEN ON 03/08/2020  5:25 PM BY SPEAKMAN, RACHEL, MD  Patient's Hb A1c in August 2021 was 7.6. He currently takes Metformin  1g twice daily and no other diabetic medications.  - Given his diarrhea, nausea, and weight loss, will discontinue Metformin

## 2020-03-08 NOTE — Patient Instructions (Signed)
[name],  Today, we discussed that your fatigue is most likely due to your chemo and radiation although feeling down or depressed can also cause this fatigue. Your Mirtazapine may be playing a role although we are hopeful this may improve your appetite.   Your blood pressure in our office was better (109/80).  Please try to continue drinking fluids and eat soft foods as you are able to.  Ensure would be very helpful.   Please STOP taking Hyzaar (your blood pressure medication)  Please also STOP taking Metformin.  Please check in regarding the status of your thyroid biopsy (FNA) as this will determine the cause of the lesion.   I will reach out to see what resources we have available for counseling and support groups and give you a call back.  Please don't hesitate to call with any questions or concerns and make a return appointment to follow up with [place] in clinic in 1 month to reassess how you are doing or sooner if you have concerns.  Thank you and take care,  Dr. Konrad Penta   Thyroid Nodule  A thyroid nodule is an isolated growth of thyroid cells that forms a lump in your thyroid gland. The thyroid gland is a butterfly-shaped gland. It is found in the lower front of your neck. This gland sends chemical messengers (hormones) through your blood to all parts of your body. These hormones are important in regulating your body temperature and helping your body to use energy. Thyroid nodules are common. Most are not cancerous (benign). You may have one nodule or several nodules. Different types of thyroid nodules include nodules that:  Grow and fill with fluid (thyroid cysts).  Produce too much thyroid hormone (hot nodules or hyperthyroid).  Produce no thyroid hormone (cold nodules or hypothyroid).  Form from cancer cells (thyroid cancers). What are the causes? In most cases, the cause of this condition is not known. What increases the risk? The following factors may make you more  likely to develop this condition.  Age. Thyroid nodules become more common in people who are older than 66 years of age.  Gender. ? Benign thyroid nodules are more common in women. ? Cancerous (malignant) thyroid nodules are more common in men.  A family history that includes: ? Thyroid nodules. ? Pheochromocytoma. ? Thyroid carcinoma. ? Hyperparathyroidism.  Certain kinds of thyroid diseases, such as Hashimoto's thyroiditis.  Lack of iodine in your diet.  A history of head and neck radiation, such as from previous cancer treatment. What are the signs or symptoms? In many cases, there are no symptoms. If you have symptoms, they may include:  A lump in your lower neck.  Feeling a lump or tickle in your throat.  Pain in your neck, jaw, or ear.  Having trouble swallowing. Hot nodules may cause symptoms that include:  Weight loss.  Warm, flushed skin.  Feeling hot.  Feeling nervous.  A racing heartbeat. Cold nodules may cause symptoms that include:  Weight gain.  Dry skin.  Brittle hair. This may also occur with hair loss.  Feeling cold.  Fatigue. Thyroid cancer nodules may cause symptoms that include:  Hard nodules that feel stuck to the thyroid gland.  Hoarseness.  Lumps in the glands near your thyroid (lymph nodes). How is this diagnosed? A thyroid nodule may be felt by your health care provider during a physical exam. This condition may also be diagnosed based on your symptoms. You may also have tests, including:  An ultrasound.  This may be done to confirm the diagnosis.  A biopsy. This involves taking a sample from the nodule and looking at it under a microscope.  Blood tests to make sure that your thyroid is working properly.  A thyroid scan. This test uses a radioactive tracer injected into a vein to create an image of the thyroid gland on a computer screen.  Imaging tests such as MRI or CT scan. These may be done if: ? Your nodule is  large. ? Your nodule is blocking your airway. ? Cancer is suspected. How is this treated? Treatment depends on the cause and size of your nodule or nodules. If the nodule is benign, treatment may not be necessary. Your health care provider may monitor the nodule to see if it goes away without treatment. If the nodule continues to grow, is cancerous, or does not go away, treatment may be needed. Treatment may include:  Having a cystic nodule drained with a needle.  Ablation therapy. In this treatment, alcohol is injected into the area of the nodule to destroy the cells. Ablation with heat (thermal ablation) may also be used.  Radioactive iodine. In this treatment, radioactive iodine is given as a pill or liquid that you drink. This substance causes the thyroid nodule to shrink.  Surgery to remove the nodule. Part or all of your thyroid gland may need to be removed as well.  Medicines. Follow these instructions at home:  Pay attention to any changes in your nodule.  Take over-the-counter and prescription medicines only as told by your health care provider.  Keep all follow-up visits as told by your health care provider. This is important. Contact a health care provider if:  Your voice changes.  You have trouble swallowing.  You have pain in your neck, ear, or jaw that is getting worse.  Your nodule gets bigger.  Your nodule starts to make it harder for you to breathe.  Your muscles look like they are shrinking (muscle wasting). Get help right away if:  You have chest pain.  There is a loss of consciousness.  You have a sudden fever.  You feel confused.  You are seeing or hearing things that other people do not see or hear (having hallucinations).  You feel very weak.  You have mood swings.  You feel very restless.  You feel suddenly nauseous or throw up.  You suddenly have diarrhea. Summary  A thyroid nodule is an isolated growth of thyroid cells that forms a  lump in your thyroid gland.  Thyroid nodules are common. Most are not cancerous (benign). You may have one nodule or several nodules.  Treatment depends on the cause and size of your nodule or nodules. If the nodule is benign, treatment may not be necessary.  Your health care provider may monitor the nodule to see if it goes away without treatment. If the nodule continues to grow, is cancerous, or does not go away, treatment may be needed. This information is not intended to replace advice given to you by your health care provider. Make sure you discuss any questions you have with your health care provider. Document Revised: 12/13/2017 Document Reviewed: 12/16/2017 Elsevier Patient Education  [place].

## 2020-03-08 NOTE — Progress Notes (Signed)
CC: decreased appetite  HPI:  Jared Shea. is a 64 y.o. gentleman with stage IV (TX, N2, M1c) Adenocarcinoma of the lung diagnosed in August 2021 with mediastinal lymphadenopathy and brain metastasis s/p R-sided craniotomy with tumor resection and SRS now receiving weekly carboplatin-based chemotherapy and radiation. He states that next week is his last week of chemoradiation. He also had a 1.3cm TI-RADS category 4 nodule of his right mid-thyroid gland and was referred for FNA biopsy, although patient states he would prefer to hold on this biopsy until he sees how effective his chemoradiation turned out to be. He has been referred to our clinic due to hypotension at his cancer center appointment. He states that he hasn't eaten much over the past month. He says that at first he avoided food due to throat pain with thrush when he first started chemotherapy, but this resolved after Diflucan. He said that he occasionally would have trouble swallowing soft foods more-so than liquids. He denies history of choking episodes or the sensation that food get stuck in his throat. He feels he has been able to drink enough water and doesn't feel dehydrated. He is unsure why he hasn't been eating as much. He does say that he has felt down, depressed, and hopeless with anhedonia and persistent fatigue, and generalized weakness with all that has going on medically recently. He says that he has had some intermittent abdominal pain over the past several weeks but denies pain today and states his nausea is under control with Zofran as needed. He endorses recent constipation that resolved with stool softeners and says he now has watery stools that are non-bloody once per day. He says that today is the best he has felt in weeks and is hopeful his chemo and radiation are working. He wishes to continue to pursue his treatments. He continues to endorse mild light-headedness upon standing but denies any heartburn, difficulty  speaking, SOB, chest pain (aside from port-site pain), headache, recent seizures, vision changes, new or changing weakness or numbness or any other symptoms.    Past Medical History:  Diagnosis Date  . Brain mass   . Bursitis of right hip   . Chest pain 08/12/2018  . Chronic cough   . Dependence on nicotine from cigarettes   . Diabetes mellitus without complication ([place])   . Essential hypertension 07/28/2018  . Seizures ([place])   . Viral illness 03/23/2019   Family History  Problem Relation Age of Onset  . Heart disease Mother        CABG  . Kidney disease Sister   . Diabetes Brother   . Kidney disease Brother        KIDNEY TRANSPLANT  . Hypertension Sister   . Healthy Daughter   . Healthy Daughter   . Healthy Son    Social Hx: Patient has stopped smoking. He does not currently drink alcohol or use other illicit drugs.   Review of Systems:  All others negative except as noted above in HPI.   Physical Exam:  Vitals:   03/08/20 1503  BP: 109/80  Pulse: 85  Temp: 98 F (36.7 C)  TempSrc: Oral  Weight: 167 lb (75.8 kg)  Height: 6' (1.829 m)   General: Patient appears tired, thin, chronically ill, but in no acute distress.  Eyes: Minimal scleral icterus. No conjunctival injection. PERRLA. HENT: Neck is supple. No palpable thyromegaly. Moist mucus membranes. Respiratory: There are crackles present bilaterally. No wheezes, rhonchi, or friction rub. No increased of  breathing. Cardiovascular: Regular rate and rhythm. No murmurs, rubs, or gallops. No lower extremity edema. Abdominal: There is mild TTP of the central abdomen without guarding or rebound. Bowel sounds intact. No distention. Neurological: Patient is alert. CN II-XII grossly intact.  Musculoskeletal: Patient appears mildly cachectic. Strength in bilateral upper and lower extremities is 5/5.  Skin: No lesions. No rashes.  Psych: Limited affect. Soft tone of voice. No SI.   Assessment & Plan:   See Encounters Tab  for problem based charting.  Patient seen with Dr. Evette Doffing.  Jared Bennett, MD 03/08/2020, 4:38 PM Pager: [phone]

## 2020-03-09 ENCOUNTER — Ambulatory Visit
Admission: RE | Admit: 2020-03-09 | Discharge: 2020-03-09 | Disposition: A | Discharge status: Home / Self Care | Payer: Medicare HMO | Source: Ambulatory Visit | Attending: Radiation Oncology | Admitting: Radiation Oncology

## 2020-03-09 ENCOUNTER — Ambulatory Visit: Payer: Medicare HMO

## 2020-03-09 ENCOUNTER — Telehealth: Payer: Self-pay | Admitting: Physician Assistant

## 2020-03-09 DIAGNOSIS — Z510 Encounter for antineoplastic radiation therapy: Secondary | ICD-10-CM | POA: Diagnosis not present

## 2020-03-09 NOTE — Progress Notes (Signed)
Internal Medicine Clinic Attending  I saw and evaluated the patient.  I personally confirmed the key portions of the history and exam documented by Dr. Speakman and I reviewed pertinent patient test results.  The assessment, diagnosis, and plan were formulated together and I agree with the documentation in the resident's note.  

## 2020-03-09 NOTE — Telephone Encounter (Signed)
Scheduled per los. Called and spoke with patient. Confirmed appt 

## 2020-03-10 ENCOUNTER — Ambulatory Visit: Payer: Medicare HMO

## 2020-03-10 ENCOUNTER — Ambulatory Visit
Admission: RE | Admit: 2020-03-10 | Discharge: 2020-03-10 | Disposition: A | Discharge status: Home / Self Care | Payer: Medicare HMO | Source: Ambulatory Visit | Attending: Radiation Oncology | Admitting: Radiation Oncology

## 2020-03-10 DIAGNOSIS — Z510 Encounter for antineoplastic radiation therapy: Secondary | ICD-10-CM | POA: Diagnosis not present

## 2020-03-11 ENCOUNTER — Ambulatory Visit: Payer: Medicare HMO

## 2020-03-11 ENCOUNTER — Other Ambulatory Visit: Payer: Self-pay | Admitting: Radiation Oncology

## 2020-03-11 ENCOUNTER — Ambulatory Visit
Admission: RE | Admit: 2020-03-11 | Discharge: 2020-03-11 | Disposition: A | Discharge status: Home / Self Care | Payer: Medicare HMO | Source: Ambulatory Visit | Attending: Radiation Oncology | Admitting: Radiation Oncology

## 2020-03-11 DIAGNOSIS — Z510 Encounter for antineoplastic radiation therapy: Secondary | ICD-10-CM | POA: Diagnosis not present

## 2020-03-11 MED ORDER — ONDANSETRON 4 MG PO TBDP: 4.0000 mg | ORAL_TABLET | Freq: Three times a day (TID) | ORAL | 0 refills | Status: DC | PRN

## 2020-03-14 ENCOUNTER — Other Ambulatory Visit: Payer: Self-pay

## 2020-03-14 ENCOUNTER — Encounter: Payer: Self-pay | Admitting: Radiation Oncology

## 2020-03-14 ENCOUNTER — Ambulatory Visit
Admission: RE | Admit: 2020-03-14 | Discharge: 2020-03-14 | Disposition: A | Discharge status: Home / Self Care | Payer: Medicare HMO | Source: Ambulatory Visit | Attending: Radiation Oncology | Admitting: Radiation Oncology

## 2020-03-14 DIAGNOSIS — Z510 Encounter for antineoplastic radiation therapy: Secondary | ICD-10-CM | POA: Diagnosis present

## 2020-03-14 DIAGNOSIS — C7931 Secondary malignant neoplasm of brain: Secondary | ICD-10-CM | POA: Insufficient documentation

## 2020-03-14 DIAGNOSIS — C3490 Malignant neoplasm of unspecified part of unspecified bronchus or lung: Secondary | ICD-10-CM | POA: Insufficient documentation

## 2020-03-21 ENCOUNTER — Ambulatory Visit (HOSPITAL_COMMUNITY)
Admission: RE | Admit: 2020-03-21 | Discharge: 2020-03-21 | Disposition: A | Discharge status: Home / Self Care | Payer: Medicare HMO | Source: Ambulatory Visit | Attending: Physician Assistant | Admitting: Physician Assistant

## 2020-03-21 ENCOUNTER — Encounter (HOSPITAL_COMMUNITY): Payer: Self-pay

## 2020-03-21 ENCOUNTER — Other Ambulatory Visit: Payer: Self-pay

## 2020-03-21 DIAGNOSIS — C3490 Malignant neoplasm of unspecified part of unspecified bronchus or lung: Secondary | ICD-10-CM

## 2020-03-31 ENCOUNTER — Telehealth: Payer: Self-pay | Admitting: Internal Medicine

## 2020-03-31 ENCOUNTER — Telehealth: Payer: Self-pay | Admitting: Physician Assistant

## 2020-03-31 NOTE — Telephone Encounter (Signed)
R/s appt per 11/18 sch msg - pt is aware of apt date and time

## 2020-03-31 NOTE — Telephone Encounter (Signed)
I noticed the patient had his follow up appointment scheduled for 04/04/20 to review his scan results. His scan has not been done yet. I called him and instructed him to call radiology scheduling today to schedule his scan. I may need to move his follow up appointment depending on when the scan is performed. I advised the patient to call today. He was given the number to radiology and specific instructions.

## 2020-04-04 ENCOUNTER — Ambulatory Visit: Payer: Medicare HMO | Admitting: Physician Assistant

## 2020-04-05 ENCOUNTER — Encounter (HOSPITAL_COMMUNITY): Payer: Self-pay

## 2020-04-05 ENCOUNTER — Ambulatory Visit (HOSPITAL_COMMUNITY)
Admission: RE | Admit: 2020-04-05 | Discharge: 2020-04-05 | Disposition: A | Discharge status: Home / Self Care | Payer: Medicare HMO | Source: Ambulatory Visit | Attending: Physician Assistant | Admitting: Physician Assistant

## 2020-04-05 ENCOUNTER — Other Ambulatory Visit: Payer: Self-pay

## 2020-04-05 DIAGNOSIS — C3490 Malignant neoplasm of unspecified part of unspecified bronchus or lung: Secondary | ICD-10-CM | POA: Insufficient documentation

## 2020-04-05 HISTORY — DX: Malignant (primary) neoplasm, unspecified: C80.1

## 2020-04-05 MED ORDER — IOHEXOL 300 MG/ML  SOLN
75.0000 mL | Freq: Once | INTRAMUSCULAR | Status: AC | PRN
  Administered 2020-04-05: 75 mL via INTRAVENOUS

## 2020-04-05 MED ORDER — HEPARIN SOD (PORK) LOCK FLUSH 100 UNIT/ML IV SOLN
500.0000 [IU] | Freq: Once | INTRAVENOUS | Status: AC
  Administered 2020-04-05: 500 [IU] via INTRAVENOUS

## 2020-04-05 MED ORDER — HEPARIN SOD (PORK) LOCK FLUSH 100 UNIT/ML IV SOLN
INTRAVENOUS | Status: AC
  Filled 2020-04-05: qty 5

## 2020-04-06 ENCOUNTER — Inpatient Hospital Stay: Payer: Medicare HMO | Attending: Physician Assistant | Admitting: Internal Medicine

## 2020-04-11 ENCOUNTER — Telehealth: Payer: Self-pay | Admitting: Radiation Oncology

## 2020-04-11 ENCOUNTER — Encounter: Payer: Self-pay | Admitting: Internal Medicine

## 2020-04-11 ENCOUNTER — Ambulatory Visit (INDEPENDENT_AMBULATORY_CARE_PROVIDER_SITE_OTHER): Payer: Medicare HMO | Admitting: Internal Medicine

## 2020-04-11 VITALS — BP 156/89 | HR 75 | Wt 182.5 lb

## 2020-04-11 DIAGNOSIS — I10 Essential (primary) hypertension: Secondary | ICD-10-CM | POA: Diagnosis not present

## 2020-04-11 DIAGNOSIS — G40209 Localization-related (focal) (partial) symptomatic epilepsy and epileptic syndromes with complex partial seizures, not intractable, without status epilepticus: Secondary | ICD-10-CM

## 2020-04-11 DIAGNOSIS — I951 Orthostatic hypotension: Secondary | ICD-10-CM

## 2020-04-11 DIAGNOSIS — E119 Type 2 diabetes mellitus without complications: Secondary | ICD-10-CM | POA: Diagnosis not present

## 2020-04-11 LAB — POCT GLYCOSYLATED HEMOGLOBIN (HGB A1C): Hemoglobin A1C: 4.4 % (ref 4.0–5.6)

## 2020-04-11 LAB — GLUCOSE, CAPILLARY: Glucose-Capillary: 92 mg/dL (ref 70–99)

## 2020-04-11 MED ORDER — LOSARTAN POTASSIUM 25 MG PO TABS: 25.0000 mg | ORAL_TABLET | Freq: Every day | ORAL | 2 refills | Status: DC

## 2020-04-11 MED ORDER — LEVETIRACETAM 750 MG PO TABS: 750.0000 mg | ORAL_TABLET | Freq: Two times a day (BID) | ORAL | 4 refills | Status: DC

## 2020-04-11 NOTE — Telephone Encounter (Signed)
  Radiation Oncology         [phone]) [phone] ________________________________  Name: Jared Shea. MRN: [id number]  Date of Service: 04/11/2020  DOB: July 18, 1955  Post Treatment Phone Visit:  Diagnosis:    Stage IV, NSCLC of the LUL involving the right frontal lobe of the brain  Interval Since Last Radiation: 4 weeks  01/25/20-03/14/20: The patient's left lung target and regional nodes were treated to 60 Gy in 30 fractions followed by a 6 Gy boost in 3 fractions.  01/05/20 Preop SRS Treatment: 15 Gy in 1 fraction ExacTrac.4 DCA beams max dose=128% PTV1 Rt Frontal 84mm  Narrative: The patient tolerated his radiotherapy as far as minimal desquamation, but during treatment was having terrible nausea, vomiting, and abdominal pain. Work up showed constipation, and conservative measures were reviewed. He did have an ED evaluation for these symptoms on two occasions and work up was unrevealing of an acute source, but I felt like he had significant stool retention the last time I saw him during treatment and he was sent to the ED due to severe symptoms, and clinically appearing ill. Today he reports he is feeling much better than when I last saw him. He denies any seizures, headaches, visual or auditory symptoms, and his abdominal pain and bowel dysfunction has improved. He did have an MRI today and this did show three possible lesions 3 mm in largest dimension in the brain, so his case will be discussed in conference next Monday. He is aware of the possibility of needing additional treatment to these sites with radiation.  Impression/Plan: 1.  Stage IV, NSCLC of the LUL involving the right frontal lobe of the brain. The patient will have restaging scans with Dr. Julien Nordmann and Cassie Heilingoetter, PAC. We will follow along with his course. Given his seizure activity he will also follow up with Dr. Mickeal Skinner given he received preop SRS and given his seizure at presentation with brain disease. We will follow  along in brain oncology conference discussion about this pt. 2. Nausea/Vomiting/Abdominal pain, oliguria with hypotension. This has resolved and will be followed expectantly by his PCP. 3. Bradycardia and hypotension. The patient was in the process of being worked up by cardiology prior to his diagnosis and needs follow up with Dr. Radford Pax as well as Dr. Konrad Penta.    Carola Rhine, PAC

## 2020-04-11 NOTE — Progress Notes (Signed)
   CC: hypotension follow up  HPI:  [name] is a 64 y.o. with a PMHx as listed below who presents to the clinic for hypotension follow up.   Please see the Encounters tab for problem-based Assessment & Plan regarding status of patient's acute and chronic conditions.  Past Medical History:  Diagnosis Date  . Brain mass   . Bursitis of right hip   . Chest pain 08/12/2018  . Chronic cough   . Dependence on nicotine from cigarettes   . Diabetes mellitus without complication ([place])   . Essential hypertension 07/28/2018  . nscl ca dx'd 12/2019  . Seizures ([place])   . Viral illness 03/23/2019   Review of Systems: Review of Systems  Constitutional: Negative for chills, fever, malaise/fatigue and weight loss.  Respiratory: Negative for cough and shortness of breath.   Cardiovascular: Negative for chest pain and leg swelling.  Gastrointestinal: Negative for abdominal pain, diarrhea, nausea and vomiting.  Musculoskeletal: Negative for back pain and myalgias.  Neurological: Positive for sensory change and focal weakness (R arm, R leg). Negative for dizziness and headaches.   Physical Exam:  Vitals:   04/11/20 1357 04/11/20 1430  BP: (!) 146/100 (!) 156/89  Pulse: 78 75  SpO2: 100%   Weight: 182 lb 8 oz (82.8 kg)    Physical Exam Vitals and nursing note reviewed.  Constitutional:      General: He is not in acute distress.    Appearance: He is normal weight.  Cardiovascular:     Rate and Rhythm: Normal rate and regular rhythm.     Heart sounds: No murmur heard.  No gallop.   Pulmonary:     Effort: Pulmonary effort is normal. No respiratory distress.     Breath sounds: No wheezing, rhonchi or rales.  Abdominal:     General: Bowel sounds are normal.     Palpations: Abdomen is soft.  Neurological:     Mental Status: He is alert and oriented to person, place, and time.     Cranial Nerves: Cranial nerves are intact.     Motor: No tremor.     Gait: Gait is intact.     Deep  Tendon Reflexes:     Reflex Scores:      Patellar reflexes are 1+ on the right side and 2+ on the left side.    Comments:  Strength testing:  Left upper and lower extremity - 5/5  Right upper extremity - 5/5  Right proximal lower extremity - 4/5  Right distal lower extremity - 5/5   Psychiatric:        Mood and Affect: Mood normal.        Behavior: Behavior normal.    Assessment & Plan:   See Encounters Tab for problem based charting.  Patient discussed with Dr. Evette Doffing

## 2020-04-11 NOTE — Patient Instructions (Addendum)
It was nice seeing you today! Thank you for choosing [facility] for your Primary Care.    Today we talked about:   1. Blood Pressure:  Your blood pressure was actually high today, so we will restart a blood pressure medication. You have been on this one before (Losartan) but we will start at a lower dose.   2. We will hold off on any medications for diabetes as your A1c is normal now.   3. Make sure to keep your follow up with the cancer doctors and the brain scan.   4. Make sure to keep taking the medication as instructed that are on your list.   Lets follow up in 1 month to see how your blood pressure is doing.

## 2020-04-12 NOTE — Assessment & Plan Note (Signed)
Assessment/plan: Secondary to reduced p.o. intake in light of chemotherapy/radiation induced nausea.  It has resolved at this time.

## 2020-04-12 NOTE — Assessment & Plan Note (Signed)
Jared Shea states that he has not been taking his Keppra as he was unaware that it was for seizure prevention.  He denies any seizure-like activity since his initial seizure several months ago.  Assessment/plan: Encouraged Jared Shea to restart his antiepileptics and discussed why they were started in the first place.  He is in agreement to continue taking them twice daily  -Continue Keppra 750 mg twice daily

## 2020-04-12 NOTE — Assessment & Plan Note (Signed)
Lab Results  Component Value Date   HGBA1C 4.4 04/11/2020   Jared Shea states he does not remember being discontinued off his Metformin previously, but notes he was not taking regularly at that time anyway.  He is not checking his sugars regularly at home.  Assessment/plan: A1c reevaluated today and shows a sharp decrease from 7.6% to 4.4%.  This is suspicious that patient's elevated A1c was actually due to steroid-induced hyperglycemia given that he had no previous history of diabetes prior to starting high-dose Decadron for his brain metastasis and his A1c has now improved after he has been stopped off steroids.    -No further intervention today -Consider periodic A1c check -Will need close monitoring of his glucose if he is on steroids in the future

## 2020-04-12 NOTE — Assessment & Plan Note (Addendum)
Jared Shea states that he has a past medical history of hypertension, however he was discontinued off his antihypertensives after developing hypotension several weeks ago.   Assessment/plan: BP: (!) 156/89  It appears that patient's previous hypotension was likely secondary to significant radiation and chemotherapy induced nausea and subsequent decreased p.o. intake.  Given that his nausea has improved at this time and he has been eating well, patient is actually on the hypertensive side today.  Blood pressure was repeated and returned even higher.  Due to this, will restart antihypertensives, however at a much lower dose and titrate slowly.  It will be important for Jared Shea to avoid SBP > 160 due to increased risk of intracranial bleed associated with brain metastasis.    If patient should undergo chemotherapy or radiation once again, will consider holding at that time.  -Start losartan 25 mg daily -Return in 1 month for blood pressure check

## 2020-04-12 NOTE — Assessment & Plan Note (Signed)
>>  ASSESSMENT AND PLAN FOR UNCONTROLLED TYPE 2 DIABETES MELLITUS WITH HYPERGLYCEMIA, WITHOUT LONG-TERM CURRENT USE OF INSULIN  ([facility]) WRITTEN ON 04/12/2020 10:09 PM BY ARNETT SAUNDERS, MD  Lab Results  Component Value Date   HGBA1C 4.4 04/11/2020   Jared Shea states he does not remember being discontinued off his Metformin  previously, but notes he was not taking regularly at that time anyway.  He is not checking his sugars regularly at home.  Assessment/plan: A1c reevaluated today and shows a sharp decrease from 7.6% to 4.4%.  This is suspicious that patient's elevated A1c was actually due to steroid-induced hyperglycemia given that he had no previous history of diabetes prior to starting high-dose Decadron  for his brain metastasis and his A1c has now improved after he has been stopped off steroids.    -No further intervention today -Consider periodic A1c check -Will need close monitoring of his glucose if he is on steroids in the future

## 2020-04-13 NOTE — Progress Notes (Signed)
Internal Medicine Clinic Attending  Case discussed with Dr. Basaraba  At the time of the visit.  We reviewed the resident's history and exam and pertinent patient test results.  I agree with the assessment, diagnosis, and plan of care documented in the resident's note.  

## 2020-04-14 ENCOUNTER — Other Ambulatory Visit: Payer: Self-pay

## 2020-04-14 ENCOUNTER — Ambulatory Visit
Admission: RE | Admit: 2020-04-14 | Discharge: 2020-04-14 | Disposition: A | Discharge status: Home / Self Care | Payer: Medicare HMO | Source: Ambulatory Visit | Attending: Radiation Oncology | Admitting: Radiation Oncology

## 2020-04-14 ENCOUNTER — Other Ambulatory Visit: Payer: Self-pay | Admitting: *Deleted

## 2020-04-14 DIAGNOSIS — C799 Secondary malignant neoplasm of unspecified site: Secondary | ICD-10-CM

## 2020-04-14 DIAGNOSIS — C7949 Secondary malignant neoplasm of other parts of nervous system: Secondary | ICD-10-CM

## 2020-04-14 DIAGNOSIS — C7931 Secondary malignant neoplasm of brain: Secondary | ICD-10-CM

## 2020-04-14 MED ORDER — GADOBENATE DIMEGLUMINE 529 MG/ML IV SOLN
17.0000 mL | Freq: Once | INTRAVENOUS | Status: AC | PRN
  Administered 2020-04-14: 17 mL via INTRAVENOUS

## 2020-04-14 MED ORDER — SODIUM CHLORIDE 0.9% FLUSH: 10.0000 mL | INTRAVENOUS | Status: DC | PRN

## 2020-04-14 MED ORDER — HEPARIN SOD (PORK) LOCK FLUSH 100 UNIT/ML IV SOLN
500.0000 [IU] | Freq: Once | INTRAVENOUS | Status: AC
  Administered 2020-04-14: 500 [IU] via INTRAVENOUS

## 2020-04-18 ENCOUNTER — Inpatient Hospital Stay: Payer: Medicare HMO

## 2020-04-18 ENCOUNTER — Telehealth: Payer: Self-pay | Admitting: Radiation Oncology

## 2020-04-18 ENCOUNTER — Other Ambulatory Visit: Payer: Self-pay

## 2020-04-18 ENCOUNTER — Inpatient Hospital Stay: Payer: Medicare HMO | Attending: Physician Assistant | Admitting: Internal Medicine

## 2020-04-18 ENCOUNTER — Other Ambulatory Visit: Payer: Self-pay | Admitting: Radiation Therapy

## 2020-04-18 ENCOUNTER — Encounter: Payer: Self-pay | Admitting: Internal Medicine

## 2020-04-18 ENCOUNTER — Inpatient Hospital Stay: Payer: Medicare HMO | Admitting: Internal Medicine

## 2020-04-18 VITALS — BP 142/85 | HR 85 | Temp 97.8°F | Resp 20 | Ht 72.0 in | Wt 184.0 lb

## 2020-04-18 VITALS — BP 142/85 | HR 85 | Temp 97.8°F | Resp 20 | Ht 72.0 in | Wt 184.3 lb

## 2020-04-18 DIAGNOSIS — Z5112 Encounter for antineoplastic immunotherapy: Secondary | ICD-10-CM | POA: Diagnosis not present

## 2020-04-18 DIAGNOSIS — C3401 Malignant neoplasm of right main bronchus: Secondary | ICD-10-CM | POA: Insufficient documentation

## 2020-04-18 DIAGNOSIS — Z79899 Other long term (current) drug therapy: Secondary | ICD-10-CM | POA: Insufficient documentation

## 2020-04-18 DIAGNOSIS — Z5111 Encounter for antineoplastic chemotherapy: Secondary | ICD-10-CM | POA: Insufficient documentation

## 2020-04-18 DIAGNOSIS — F17210 Nicotine dependence, cigarettes, uncomplicated: Secondary | ICD-10-CM | POA: Diagnosis not present

## 2020-04-18 DIAGNOSIS — E119 Type 2 diabetes mellitus without complications: Secondary | ICD-10-CM | POA: Diagnosis not present

## 2020-04-18 DIAGNOSIS — Z7189 Other specified counseling: Secondary | ICD-10-CM

## 2020-04-18 DIAGNOSIS — G40909 Epilepsy, unspecified, not intractable, without status epilepticus: Secondary | ICD-10-CM | POA: Diagnosis not present

## 2020-04-18 DIAGNOSIS — I10 Essential (primary) hypertension: Secondary | ICD-10-CM | POA: Diagnosis not present

## 2020-04-18 DIAGNOSIS — C3491 Malignant neoplasm of unspecified part of right bronchus or lung: Secondary | ICD-10-CM | POA: Diagnosis not present

## 2020-04-18 DIAGNOSIS — C7931 Secondary malignant neoplasm of brain: Secondary | ICD-10-CM

## 2020-04-18 DIAGNOSIS — R569 Unspecified convulsions: Secondary | ICD-10-CM | POA: Diagnosis not present

## 2020-04-18 NOTE — Progress Notes (Signed)
[place] [phone]   [phone]  OFFICE PROGRESS NOTE  Jean Rosenthal, MD [address] [place] [address]  DIAGNOSIS: stage IV (TX, N2, M1 C) non-small cell lung cancer, adenocarcinoma diagnosed in August 2021 and presented with solitary brain metastasis in addition to mediastinal lymphadenopathy.  PDL1 Expression 70%  Molecular Biomarkers: No actionable mutations  PRIOR THERAPY:  1) Status post right craniotomy with tumor resection followed by SRS to solitary brain metastasis under the care of Dr. Lisbeth Renshaw and Dr. Sherral Hammers. 2) Concurrent chemoradiation with weekly carboplatin for AUC of 2 and paclitaxel 45 mg/M2. First dose 02/01/2020.Status post 5 cycles. Last dose was given February 29, 2020.  CURRENT THERAPY:  First-line treatment with immunotherapy with Libtayo (Cempilimab) 350 mg IV every 3 weeks.  First dose April 25, 2020.   INTERVAL HISTORY: Jared Shea. 64 y.o. male returns to the clinic today for follow-up visit accompanied by his sister.  The patient is feeling fine today with no concerning complaints.  He recovered from the course of concurrent chemoradiation.  He denied having any significant chest pain, shortness of breath, cough or hemoptysis.  He denied having any nausea, vomiting, diarrhea or constipation.  He denied having any headache or visual changes.  He has no significant weight loss or night sweats.  The patient had repeat CT scan of the chest as well as MRI of the brain performed recently and he is here for evaluation and discussion of his scan results and treatment options.  MEDICAL HISTORY: Past Medical History:  Diagnosis Date  . Brain mass   . Bursitis of right hip   . Chest pain 08/12/2018  . Chronic cough   . Dependence on nicotine from cigarettes   . Diabetes mellitus without complication ([place])   . Essential hypertension 07/28/2018  . nscl ca dx'd 12/2019  . Seizures ([place])   . Viral  illness 03/23/2019    ALLERGIES:  is allergic to penicillins.  MEDICATIONS:  Current Outpatient Medications  Medication Sig Dispense Refill  . levETIRAcetam (KEPPRA) 750 MG tablet Take 1 tablet (750 mg total) by mouth 2 (two) times daily. 180 tablet 4  . losartan (COZAAR) 25 MG tablet Take 1 tablet (25 mg total) by mouth daily. 30 tablet 2  . mirtazapine (REMERON) 15 MG tablet Take 1 tablet (15 mg total) by mouth at bedtime. 30 tablet 2  . omeprazole (PRILOSEC) 20 MG capsule Take 1 capsule (20 mg total) by mouth daily. 30 capsule 2   No current facility-administered medications for this visit.    SURGICAL HISTORY:  Past Surgical History:  Procedure Laterality Date  . APPLICATION OF CRANIAL NAVIGATION N/A 01/07/2020   Procedure: APPLICATION OF CRANIAL NAVIGATION;  Surgeon: Ashok Pall, MD;  Location: [facility];  Service: Neurosurgery;  Laterality: N/A;  . CRANIOTOMY Right 01/07/2020   Procedure: RIGHT CRANIOTOMY FOR TUMOR RESECTION;  Surgeon: Ashok Pall, MD;  Location: [facility];  Service: Neurosurgery;  Laterality: Right;  rm 21  . ENDOBRONCHIAL ULTRASOUND N/A 12/21/2019   Procedure: ENDOBRONCHIAL ULTRASOUND;  Surgeon: Laurin Coder, MD;  Location: [facility];  Service: Pulmonary;  Laterality: N/A;  . FINE NEEDLE ASPIRATION  12/21/2019   Procedure: FINE NEEDLE ASPIRATION (FNA) LINEAR;  Surgeon: Laurin Coder, MD;  Location: [facility];  Service: Pulmonary;;  . IR IMAGING GUIDED PORT INSERTION  03/03/2020  . NO PAST SURGERIES    . VIDEO BRONCHOSCOPY N/A 12/21/2019   Procedure: VIDEO  BRONCHOSCOPY WITHOUT FLUORO;  Surgeon: Laurin Coder, MD;  Location: [facility];  Service: Pulmonary;  Laterality: N/A;    REVIEW OF SYSTEMS:  Constitutional: positive for fatigue Eyes: negative Ears, nose, mouth, throat, and face: negative Respiratory: negative Cardiovascular: negative Gastrointestinal: negative Genitourinary:negative Integument/breast: negative Hematologic/lymphatic:  negative Musculoskeletal:negative Neurological: negative Behavioral/Psych: negative Endocrine: negative Allergic/Immunologic: negative   PHYSICAL EXAMINATION: General appearance: alert, cooperative, fatigued and no distress Head: Normocephalic, without obvious abnormality, atraumatic Neck: no adenopathy, no JVD, supple, symmetrical, trachea midline and thyroid not enlarged, symmetric, no tenderness/mass/nodules Lymph nodes: Cervical, supraclavicular, and axillary nodes normal. Resp: clear to auscultation bilaterally Back: symmetric, no curvature. ROM normal. No CVA tenderness. Cardio: regular rate and rhythm, S1, S2 normal, no murmur, click, rub or gallop GI: soft, non-tender; bowel sounds normal; no masses,  no organomegaly Extremities: extremities normal, atraumatic, no cyanosis or edema Neurologic: Alert and oriented X 3, normal strength and tone. Normal symmetric reflexes. Normal coordination and gait  ECOG PERFORMANCE STATUS: 1 - Symptomatic but completely ambulatory  Blood pressure (!) 142/85, pulse 85, temperature 97.8 F (36.6 C), temperature source Tympanic, resp. rate 20, height 6' (1.829 m), weight 184 lb 4.8 oz (83.6 kg), SpO2 99 %.  LABORATORY DATA: Lab Results  Component Value Date   WBC 5.3 03/07/2020   HGB 13.1 03/07/2020   HCT 36.3 (L) 03/07/2020   MCV 89.0 03/07/2020   PLT 188 03/07/2020      Chemistry      Component Value Date/Time   NA 139 03/07/2020 1003   NA 140 11/23/2019 1516   K 3.0 (LL) 03/07/2020 1003   CL 103 03/07/2020 1003   CO2 28 03/07/2020 1003   BUN 14 03/07/2020 1003   BUN 14 11/23/2019 1516   CREATININE 0.93 03/07/2020 1003      Component Value Date/Time   CALCIUM 9.6 03/07/2020 1003   ALKPHOS 77 03/07/2020 1003   AST 29 03/07/2020 1003   ALT 40 03/07/2020 1003   BILITOT 1.3 (H) 03/07/2020 1003       RADIOGRAPHIC STUDIES: CT Chest W Contrast  Result Date: 04/06/2020 CLINICAL DATA:  Non-small cell lung cancer. Assess  treatment response post radiation therapy. Chemotherapy in progress. EXAM: CT CHEST WITH CONTRAST TECHNIQUE: Multidetector CT imaging of the chest was performed during intravenous contrast administration. CONTRAST:  97m OMNIPAQUE IOHEXOL 300 MG/ML  SOLN COMPARISON:  Chest CT 12/14/2019. PET-CT 01/01/2020 abdominal CT 02/24/2020. FINDINGS: Cardiovascular: Mild coronary artery atherosclerosis. No other significant vascular findings. Limited opacification of the pulmonary arteries. Right IJ Port-A-Cath extends to the superior cavoatrial junction. The heart size is normal. There is no pericardial effusion. Mediastinum/Nodes: The dominant left infrahilar node which was hypermetabolic on PET-CT has decreased in size, measuring 14 mm short axis on image 81/2 (previously 19 mm). The less hypermetabolic subcarinal node has not significantly changed in size, measuring 20 mm on image 72/2. Small left hilar nodes appear unchanged. There are no new or enlarging mediastinal, hilar or axillary lymph nodes. The thyroid gland, trachea and esophagus demonstrate no significant findings. Lungs/Pleura: There is no pleural effusion. Mild centrilobular emphysema with mild central airway thickening and linear scarring in the right middle lobe and lingula. 4 mm left upper lobe nodule on image 54/7 is unchanged. No new or enlarging pulmonary nodules. Upper abdomen: Small dependent gallstones. The visualized upper abdomen is otherwise unremarkable. No adrenal mass. Musculoskeletal/Chest wall: There is no chest wall mass or suspicious osseous finding. IMPRESSION: 1. Interval decreased size of dominant left infrahilar node,  hypermetabolic on PET-CT, consistent with response to therapy. Stable subcarinal and left hilar lymph nodes. 2. No evidence of progressive metastatic disease. 3. Stable small left upper lobe pulmonary nodule. 4. Cholelithiasis. 5. Aortic Atherosclerosis (ICD10-I70.0) and Emphysema (ICD10-J43.9). Electronically Signed   By:  Richardean Sale M.D.   On: 04/06/2020 09:35   MR Brain W Wo Contrast  Result Date: 04/14/2020 CLINICAL DATA:  Metastatic non-small cell lung cancer. Right frontal brain metastasis treated with surgical resection and preoperative SRS. EXAM: MRI HEAD WITHOUT AND WITH CONTRAST TECHNIQUE: Multiplanar, multiecho pulse sequences of the brain and surrounding structures were obtained without and with intravenous contrast. CONTRAST:  44m MULTIHANCE GADOBENATE DIMEGLUMINE 529 MG/ML IV SOLN Contrast was administered via a port which was accessed by a registered nurse. COMPARISON:  01/07/2020 and 12/31/2019 FINDINGS: BRAIN New Lesions: 1. 2 mm enhancing lesion in the superior left cerebellar hemisphere (series 11, image 63). 2. 1 mm enhancing lesion in the right occipital lobe (series 11, image 94). 3. 3 mm enhancing lesion in the left frontal lobe (series 11, image 101). There is no significant vasogenic edema associated with these new lesions. Larger lesions: None. Stable or Smaller lesions: Small posterior right frontal lobe resection cavity containing chronic blood products in the posterior right frontal lobe. Intrinsic T1 hyperintensity without suspicious enhancement at the resection site. Essentially resolved regional vasogenic edema. Other Brain findings: No acute infarct, intracranial mass effect, or extra-axial fluid collection is identified. The ventricles are normal in size. Scattered small foci of T2 hyperintensity in the cerebral white matter bilaterally are nonspecific but compatible with mild chronic small vessel ischemic. Vascular: Major intracranial vascular flow voids are preserved. Skull and upper cervical spine: Posterior right frontal craniotomy. No suspicious marrow lesion. Sinuses/Orbits: Unremarkable orbits. Moderate mucosal thickening in the left sphenoid sinus. Clear mastoid air cells. Other: None. IMPRESSION: 1. Three new brain metastases measuring up to 3 mm. No significant edema. 2. Resection  cavity in the posterior right frontal lobe with resolved edema and no evidence of locally recurrent tumor. Electronically Signed   By: ALogan BoresM.D.   On: 04/14/2020 11:59    ASSESSMENT AND PLAN: This is a very pleasant 64years old African-American male recently diagnosed with a stage IV (TX, N2, M1c) non-small cell lung cancer, adenocarcinoma presented with solitary brain metastasis in addition to right hilar and mediastinal lymphadenopathy diagnosed in August 2021.  The patient is status post right craniotomy with resection of the solitary brain metastasis with SRS. His PET scan showed no evidence of metastatic disease outside the chest. The patient was treated with a course of concurrent chemoradiation with weekly carboplatin and paclitaxel status post 5 cycles.  He tolerated his treatment well except for dysphagia and odynophagia. He had repeat CT scan of the chest as well as MRI of the brain performed recently and is here for evaluation and discussion of his imaging studies and treatment options. He has a scan of the chest showed improvement of his disease in the chest. The MRI of the brain showed 3 new brain metastasis measuring up to 3 mm.  He is scheduled to see Dr. MLisbeth Renshawfor evaluation and recommendation regarding these findings. I had a lengthy discussion with the patient today about his systemic treatment options.  His PD-L1 expression is 70%. I discussed with the patient treatment with first-line immunotherapy with Libtayo (Cempilimab) 350 mg IV every 3 weeks for a total of 2 years if the patient has no evidence for disease progression or unacceptable  toxicity. I discussed with the patient the adverse effect of this treatment. He is expected to start the first dose of this treatment on April 25, 2020. The patient will come back for follow-up visit in 4 weeks for evaluation before starting cycle #2. He was advised to call immediately if he has any concerning symptoms in the  interval. The patient voices understanding of current disease status and treatment options and is in agreement with the current care plan.  All questions were answered. The patient knows to call the clinic with any problems, questions or concerns. We can certainly see the patient much sooner if necessary. The total time spent in the appointment was 50 minutes.  Disclaimer: This note was dictated with voice recognition software. Similar sounding words can inadvertently be transcribed and may not be corrected upon review.

## 2020-04-18 NOTE — Progress Notes (Signed)
DISCONTINUE ON PATHWAY REGIMEN - Non-Small Cell Lung     Administer weekly:     Paclitaxel      Carboplatin   **Always confirm dose/schedule in your pharmacy ordering system**  REASON: Other Reason PRIOR TREATMENT: [id number]: Carboplatin AUC=2 + Paclitaxel 45 mg/m2 Weekly During Radiation TREATMENT RESPONSE: Partial Response (PR)  START OFF PATHWAY REGIMEN - Non-Small Cell Lung   [id number]:Cemiplimab 350 mg IV D1 q21 Days:   A cycle is every 21 days:     Cemiplimab-rwlc   **Always confirm dose/schedule in your pharmacy ordering system**  Patient Characteristics: Stage IV Metastatic, Nonsquamous, Initial Chemotherapy/Immunotherapy, PS = 0, 1, ALK Rearrangement Negative and ROS1 Rearrangement Negative and NTRK Gene Fusion?Negative and RET Gene Fusion?Negative and EGFR Mutation Negative, PD-L1 Expression Positive   ? 50% (TPS) and Immunotherapy Candidate Therapeutic Status: Stage IV Metastatic Histology: Nonsquamous Cell ROS1 Rearrangement Status: Negative Other Mutations/Biomarkers: No Other Actionable Mutations Chemotherapy/Immunotherapy LOT: Initial Chemotherapy/Immunotherapy Molecular Targeted Therapy: Not Appropriate KRAS G12C Mutation Status: Negative MET Exon 14 Mutation Status: Negative RET Gene Fusion Status: Negative EGFR Mutation Status: Negative/Wild Type NTRK Gene Fusion Status: Negative PD-L1 Expression Status: PD-L1 Positive ? 50% (TPS) ALK Rearrangement Status: Negative BRAF V600E Mutation Status: Negative ECOG Performance Status: 1 Biomarker Assessment Status Confirmation: All Genomic Markers Negative, or Only MET+ or BRAF+ or KRAS G12C+ Immunotherapy Candidate Status: Candidate for Immunotherapy Intent of Therapy: Non-Curative / Palliative Intent, Discussed with Patient

## 2020-04-18 NOTE — Progress Notes (Addendum)
Has armband been applied?  Yes  Does patient have an allergy to IV contrast dye?: No   Has patient ever received premedication for IV contrast dye?: n/a  Does patient take metformin?: No  If patient does take metformin when was the last dose: n/a  Date of lab work: 04/19/2020 BUN: 12 CR:  0.76 eGfr: >60  IV site: Right Chest Port  Has IV site been added to flowsheet?  Yes  BP 105/88 (BP Location: Left Arm, Patient Position: Sitting, Cuff Size: Large)   Pulse 84   Temp 97.9 F (36.6 C)   Resp 18   Ht 6' (1.829 m)   Wt 186 lb (84.4 kg)   SpO2 100%   BMI 25.23 kg/m

## 2020-04-19 ENCOUNTER — Inpatient Hospital Stay: Payer: Medicare HMO

## 2020-04-19 ENCOUNTER — Other Ambulatory Visit: Payer: Self-pay

## 2020-04-19 ENCOUNTER — Ambulatory Visit
Admission: RE | Admit: 2020-04-19 | Discharge: 2020-04-19 | Disposition: A | Discharge status: Home / Self Care | Payer: Medicare HMO | Source: Ambulatory Visit | Attending: Radiation Oncology | Admitting: Radiation Oncology

## 2020-04-19 VITALS — BP 105/88 | HR 84 | Temp 97.9°F | Resp 18 | Ht 72.0 in | Wt 186.0 lb

## 2020-04-19 DIAGNOSIS — C7931 Secondary malignant neoplasm of brain: Secondary | ICD-10-CM

## 2020-04-19 DIAGNOSIS — Z510 Encounter for antineoplastic radiation therapy: Secondary | ICD-10-CM | POA: Diagnosis not present

## 2020-04-19 DIAGNOSIS — Z5111 Encounter for antineoplastic chemotherapy: Secondary | ICD-10-CM | POA: Diagnosis not present

## 2020-04-19 LAB — BUN & CREATININE (CHCC)
BUN: 12 mg/dL (ref 8–23)
Creatinine: 0.76 mg/dL (ref 0.61–1.24)
GFR, Estimated: >60 mL/min (ref >60)

## 2020-04-19 MED ORDER — HEPARIN SOD (PORK) LOCK FLUSH 100 UNIT/ML IV SOLN
500.0000 [IU] | Freq: Once | INTRAVENOUS | Status: AC
  Administered 2020-04-19: 500 [IU] via INTRAVENOUS

## 2020-04-19 MED ORDER — SODIUM CHLORIDE 0.9% FLUSH
10.0000 mL | Freq: Once | INTRAVENOUS | Status: AC
  Administered 2020-04-19: 10 mL via INTRAVENOUS

## 2020-04-19 NOTE — Patient Instructions (Signed)

## 2020-04-19 NOTE — Progress Notes (Signed)
[facility] [place], [place] [address] [phone]   New Patient Evaluation  Date of Service: 04/19/20 Patient Name: Jared Shea. Patient MRN: [id number] Patient DOB: 07-Oct-1955 Provider: Ventura Sellers, MD  Identifying Statement:  Jared Shea. is a 64 y.o. male with Brain metastases [facility]) [C79.31] who presents for initial consultation and evaluation regarding cancer associated neurologic deficits.    Referring Provider: Jean Rosenthal, MD [address] [place],  [place] [address]  Primary Cancer:  Oncologic History: Oncology History  Adenocarcinoma, metastatic ([place])  01/07/2020 Initial Diagnosis   Adenocarcinoma, metastatic ([place])   02/01/2020 - 03/07/2020 Chemotherapy   The patient had dexamethasone (DECADRON) 4 MG tablet, 8 mg, Oral, Daily, 1 of 1 cycle, Start date: --, End date: -- palonosetron (ALOXI) injection 0.25 mg, 0.25 mg, Intravenous,  Once, 5 of 7 cycles Administration: 0.25 mg (02/01/2020), 0.25 mg (02/08/2020), 0.25 mg (03/07/2020), 0.25 mg (02/15/2020), 0.25 mg (02/22/2020) CARBOplatin (PARAPLATIN) 220 mg in sodium chloride 0.9 % 250 mL chemo infusion, 220 mg (100 % of original dose 216.8 mg), Intravenous,  Once, 5 of 7 cycles Dose modification: 216.8 mg (original dose 216.8 mg, Cycle 1) Administration: 220 mg (02/01/2020), 220 mg (02/08/2020), 220 mg (03/07/2020), 220 mg (02/15/2020), 220 mg (02/22/2020) PACLitaxel (TAXOL) 90 mg in sodium chloride 0.9 % 250 mL chemo infusion (</= 80mg /m2), 45 mg/m2 = 90 mg, Intravenous,  Once, 5 of 7 cycles Administration: 90 mg (02/01/2020), 90 mg (02/08/2020), 90 mg (03/07/2020), 90 mg (02/15/2020), 90 mg (02/22/2020)  for chemotherapy treatment.    04/25/2020 -  Chemotherapy   The patient had cemiplimab-rwlc (LIBTAYO) 350 mg in sodium chloride 0.9 % 100 mL chemo infusion, 350 mg, Intravenous, Once, 0 of 9 cycles  for chemotherapy treatment.     CNS Oncologic  History 01/05/20: Pre-op SRS Jared Shea) 01/07/20: Craniotomy, R frontal resection ([place])  History of Present Illness: The patient's records from the referring physician were obtained and reviewed and the patient interviewed to confirm this HPI.  Jared Shea. presents today following recent brain MRI.  He had completed pre-op SRS and underwent craniotomy for R frontal metastasis, after initial presenting with focal seizures (facial twitching).  He denies focal neurologic complaints today, he is functional and independent.  He is undergoing chemotherapy with Dr. Julien Nordmann (completed [place] and taxol) without complication.  His gait is unassisted.  Denies any seizures following his initial presentation, compliant with Keppra.      Medications: Current Outpatient Medications on File Prior to Visit  Medication Sig Dispense Refill  . levETIRAcetam (KEPPRA) 750 MG tablet Take 1 tablet (750 mg total) by mouth 2 (two) times daily. 180 tablet 4  . losartan (COZAAR) 25 MG tablet Take 1 tablet (25 mg total) by mouth daily. 30 tablet 2  . mirtazapine (REMERON) 15 MG tablet Take 1 tablet (15 mg total) by mouth at bedtime. 30 tablet 2  . omeprazole (PRILOSEC) 20 MG capsule Take 1 capsule (20 mg total) by mouth daily. 30 capsule 2   No current facility-administered medications on file prior to visit.    Allergies:  Allergies  Allergen Reactions  . Penicillins Rash    Did it involve swelling of the face/tongue/throat, SOB, or low BP? N Did it involve sudden or severe rash/hives, skin peeling, or any reaction on the inside of your mouth or nose? Y Did you need to seek medical attention at a hospital or doctor's office? Jared Shea  When did it last happen?childhood If all above answers are "NO", may proceed with cephalosporin use.    Past Medical History:  Past Medical History:  Diagnosis Date  . Brain mass   . Bursitis of right hip   . Chest pain 08/12/2018  . Chronic cough   . Dependence on nicotine  from cigarettes   . Diabetes mellitus without complication ([place])   . Essential hypertension 07/28/2018  . nscl ca dx'd 12/2019  . Seizures ([place])   . Viral illness 03/23/2019   Past Surgical History:  Past Surgical History:  Procedure Laterality Date  . APPLICATION OF CRANIAL NAVIGATION N/A 01/07/2020   Procedure: APPLICATION OF CRANIAL NAVIGATION;  Surgeon: Jared Pall, MD;  Location: [facility];  Service: Neurosurgery;  Laterality: N/A;  . CRANIOTOMY Right 01/07/2020   Procedure: RIGHT CRANIOTOMY FOR TUMOR RESECTION;  Surgeon: Jared Pall, MD;  Location: [facility];  Service: Neurosurgery;  Laterality: Right;  rm 21  . ENDOBRONCHIAL ULTRASOUND N/A 12/21/2019   Procedure: ENDOBRONCHIAL ULTRASOUND;  Surgeon: Jared Coder, MD;  Location: [facility];  Service: Pulmonary;  Laterality: N/A;  . FINE NEEDLE ASPIRATION  12/21/2019   Procedure: FINE NEEDLE ASPIRATION (FNA) LINEAR;  Surgeon: Jared Coder, MD;  Location: [facility];  Service: Pulmonary;;  . IR IMAGING GUIDED PORT INSERTION  03/03/2020  . NO PAST SURGERIES    . VIDEO BRONCHOSCOPY N/A 12/21/2019   Procedure: VIDEO BRONCHOSCOPY WITHOUT FLUORO;  Surgeon: Jared Coder, MD;  Location: [facility];  Service: Pulmonary;  Laterality: N/A;   Social History:  Social History   Socioeconomic History  . Marital status: Divorced    Spouse name: Not on file  . Number of children: Not on file  . Years of education: Not on file  . Highest education level: Not on file  Occupational History  . Not on file  Tobacco Use  . Smoking status: Former Smoker    Packs/day: 1.00    Years: 48.00    Pack years: 48.00    Types: Cigarettes    Start date: 08/07/1970    Quit date: 12/15/2019    Years since quitting: 0.3  . Smokeless tobacco: Never Used  Vaping Use  . Vaping Use: Never used  Substance and Sexual Activity  . Alcohol use: Not Currently  . Drug use: Never  . Sexual activity: Not on file    Comment: YES  Other Topics Concern  .  Not on file  Social History Narrative  . Not on file   Social Determinants of Health   Financial Resource Strain: High Risk  . Difficulty of Paying Living Expenses: Hard  Food Insecurity: No Food Insecurity  . Worried About Charity fundraiser in the Last Year: Never true  . Ran Out of Food in the Last Year: Never true  Transportation Needs: Unmet Transportation Needs  . Lack of Transportation (Medical): Yes  . Lack of Transportation (Non-Medical): Yes  Physical Activity:   . Days of Exercise per Week: Not on file  . Minutes of Exercise per Session: Not on file  Stress:   . Feeling of Stress : Not on file  Social Connections:   . Frequency of Communication with Friends and Family: Not on file  . Frequency of Social Gatherings with Friends and Family: Not on file  . Attends Religious Services: Not on file  . Active Member of Clubs or Organizations: Not on file  . Attends Archivist Meetings: Not on file  . Marital  Status: Not on file  Intimate Partner Violence:   . Fear of Current or Ex-Partner: Not on file  . Emotionally Abused: Not on file  . Physically Abused: Not on file  . Sexually Abused: Not on file   Family History:  Family History  Problem Relation Age of Onset  . Heart disease Mother        CABG  . Kidney disease Sister   . Diabetes Brother   . Kidney disease Brother        KIDNEY TRANSPLANT  . Hypertension Sister   . Healthy Daughter   . Healthy Daughter   . Healthy Son     Review of Systems: Constitutional: Doesn't report fevers, chills or abnormal weight loss Eyes: Doesn't report blurriness of vision Ears, nose, mouth, throat, and face: Doesn't report sore throat Respiratory: Doesn't report cough, dyspnea or wheezes Cardiovascular: Doesn't report palpitation, chest discomfort  Gastrointestinal:  Doesn't report nausea, constipation, diarrhea GU: Doesn't report incontinence Skin: Doesn't report skin rashes Neurological: Per  HPI Musculoskeletal: Doesn't report joint pain Behavioral/Psych: Doesn't report anxiety  Physical Exam: Vitals:   04/18/20 1053  BP: (!) 142/85  Pulse: 85  Resp: 20  Temp: 97.8 F (36.6 C)  SpO2: 99%   KPS: 90. General: Alert, cooperative, pleasant, in no acute distress Head: Normal EENT: No conjunctival injection or scleral icterus.  Lungs: Resp effort normal Cardiac: Regular rate Abdomen: Non-distended abdomen Skin: No rashes cyanosis or petechiae. Extremities: No clubbing or edema  Neurologic Exam: Mental Status: Awake, alert, attentive to examiner. Oriented to self and environment. Language is fluent with intact comprehension.  Cranial Nerves: Visual acuity is grossly normal. Visual fields are full. Extra-ocular movements intact. No ptosis. Face is symmetric Motor: Tone and bulk are normal. Power is full in both arms and legs. Reflexes are symmetric, no pathologic reflexes present.  Sensory: Intact to light touch Gait: Normal.   Labs: I have reviewed the data as listed    Component Value Date/Time   NA 139 03/07/2020 1003   NA 140 11/23/2019 1516   K 3.0 (LL) 03/07/2020 1003   CL 103 03/07/2020 1003   CO2 28 03/07/2020 1003   GLUCOSE 105 (H) 03/07/2020 1003   BUN 14 03/07/2020 1003   BUN 14 11/23/2019 1516   CREATININE 0.93 03/07/2020 1003   CALCIUM 9.6 03/07/2020 1003   PROT 7.6 03/07/2020 1003   ALBUMIN 4.2 03/07/2020 1003   AST 29 03/07/2020 1003   ALT 40 03/07/2020 1003   ALKPHOS 77 03/07/2020 1003   BILITOT 1.3 (H) 03/07/2020 1003   GFRNONAA >60 03/07/2020 1003   GFRAA >60 02/15/2020 1000   Lab Results  Component Value Date   WBC 5.3 03/07/2020   NEUTROABS 4.1 03/07/2020   HGB 13.1 03/07/2020   HCT 36.3 (L) 03/07/2020   MCV 89.0 03/07/2020   PLT 188 03/07/2020    Imaging:  CT Chest W Contrast  Result Date: 04/06/2020 CLINICAL DATA:  Non-small cell lung cancer. Assess treatment response post radiation therapy. Chemotherapy in progress.  EXAM: CT CHEST WITH CONTRAST TECHNIQUE: Multidetector CT imaging of the chest was performed during intravenous contrast administration. CONTRAST:  62mL OMNIPAQUE IOHEXOL 300 MG/ML  SOLN COMPARISON:  Chest CT 12/14/2019. PET-CT 01/01/2020 abdominal CT 02/24/2020. FINDINGS: Cardiovascular: Mild coronary artery atherosclerosis. No other significant vascular findings. Limited opacification of the pulmonary arteries. Right IJ Port-A-Cath extends to the superior cavoatrial junction. The heart size is normal. There is no pericardial effusion. Mediastinum/Nodes: The dominant left infrahilar node  which was hypermetabolic on PET-CT has decreased in size, measuring 14 mm short axis on image 81/2 (previously 19 mm). The less hypermetabolic subcarinal node has not significantly changed in size, measuring 20 mm on image 72/2. Small left hilar nodes appear unchanged. There are no new or enlarging mediastinal, hilar or axillary lymph nodes. The thyroid gland, trachea and esophagus demonstrate no significant findings. Lungs/Pleura: There is no pleural effusion. Mild centrilobular emphysema with mild central airway thickening and linear scarring in the right middle lobe and lingula. 4 mm left upper lobe nodule on image 54/7 is unchanged. No new or enlarging pulmonary nodules. Upper abdomen: Small dependent gallstones. The visualized upper abdomen is otherwise unremarkable. No adrenal mass. Musculoskeletal/Chest wall: There is no chest wall mass or suspicious osseous finding. IMPRESSION: 1. Interval decreased size of dominant left infrahilar node, hypermetabolic on PET-CT, consistent with response to therapy. Stable subcarinal and left hilar lymph nodes. 2. No evidence of progressive metastatic disease. 3. Stable small left upper lobe pulmonary nodule. 4. Cholelithiasis. 5. Aortic Atherosclerosis (ICD10-I70.0) and Emphysema (ICD10-J43.9). Electronically Signed   By: Jared Shea M.D.   On: 04/06/2020 09:35   MR Brain W Wo  Contrast  Result Date: 04/14/2020 CLINICAL DATA:  Metastatic non-small cell lung cancer. Right frontal brain metastasis treated with surgical resection and preoperative SRS. EXAM: MRI HEAD WITHOUT AND WITH CONTRAST TECHNIQUE: Multiplanar, multiecho pulse sequences of the brain and surrounding structures were obtained without and with intravenous contrast. CONTRAST:  13mL MULTIHANCE GADOBENATE DIMEGLUMINE 529 MG/ML IV SOLN Contrast was administered via a port which was accessed by a registered nurse. COMPARISON:  01/07/2020 and 12/31/2019 FINDINGS: BRAIN New Lesions: 1. 2 mm enhancing lesion in the superior left cerebellar hemisphere (series 11, image 63). 2. 1 mm enhancing lesion in the right occipital lobe (series 11, image 94). 3. 3 mm enhancing lesion in the left frontal lobe (series 11, image 101). There is no significant vasogenic edema associated with these new lesions. Larger lesions: None. Stable or Smaller lesions: Small posterior right frontal lobe resection cavity containing chronic blood products in the posterior right frontal lobe. Intrinsic T1 hyperintensity without suspicious enhancement at the resection site. Essentially resolved regional vasogenic edema. Other Brain findings: No acute infarct, intracranial mass effect, or extra-axial fluid collection is identified. The ventricles are normal in size. Scattered small foci of T2 hyperintensity in the cerebral white matter bilaterally are nonspecific but compatible with mild chronic small vessel ischemic. Vascular: Major intracranial vascular flow voids are preserved. Skull and upper cervical spine: Posterior right frontal craniotomy. No suspicious marrow lesion. Sinuses/Orbits: Unremarkable orbits. Moderate mucosal thickening in the left sphenoid sinus. Clear mastoid air cells. Other: None. IMPRESSION: 1. Three new brain metastases measuring up to 3 mm. No significant edema. 2. Resection cavity in the posterior right frontal lobe with resolved edema  and no evidence of locally recurrent tumor. Electronically Signed   By: Logan Bores M.D.   On: 04/14/2020 11:59    [facility] Clinician Interpretation: I have personally reviewed the radiological images as listed.  My interpretation, in the context of the patient's clinical presentation, is progressive disease   Assessment/Plan Brain metastases [facility]) [C79.31]  Jared Shea. is clinically stable today.  Seizures are well controlled, he is off dexamethasone.  Unfortunately brain MRI demonstrates three foci of enhancement consistent with novel metastases.  These images were reviewed in brain/spine tumor board this morning.  We reviewed treatment recommendation, which is for salvage radiosurgery.  We reviewed risks and benefits of  SRS vs whole brain radiation.  He is agreeable to move forward with plan for radiosurgery.   We will communicate with radiation oncology team.  No changes to medications, may continue Keppra 500mg  BID.  We spent twenty additional minutes teaching regarding the natural history, biology, and historical experience in the treatment of neurologic complications of cancer.   We appreciate the opportunity to participate in the care of Jared Shea..  We ask that Jared Shea. return to clinic in 3 months following post-SRS brain MRI, or sooner as needed.  All questions were answered. The patient knows to call the clinic with any problems, questions or concerns. No barriers to learning were detected.  The total time spent in the encounter was 40 minutes and more than 50% was on counseling and review of test results   Ventura Sellers, MD Medical Director of Neuro-Oncology [facility] [place] 04/19/20 8:06 AM

## 2020-04-21 ENCOUNTER — Telehealth: Payer: Self-pay | Admitting: Internal Medicine

## 2020-04-21 DIAGNOSIS — Z510 Encounter for antineoplastic radiation therapy: Secondary | ICD-10-CM | POA: Diagnosis not present

## 2020-04-21 NOTE — Telephone Encounter (Signed)
Scheduled per los. Called and spoke with patient. Confirmed appts that were scheduled. Advised will call once infusion for next week is approved.

## 2020-04-22 ENCOUNTER — Encounter: Payer: Self-pay | Admitting: Radiation Oncology

## 2020-04-22 ENCOUNTER — Telehealth: Payer: Self-pay | Admitting: Internal Medicine

## 2020-04-22 ENCOUNTER — Ambulatory Visit
Admission: RE | Admit: 2020-04-22 | Discharge: 2020-04-22 | Disposition: A | Discharge status: Home / Self Care | Payer: Medicare HMO | Source: Ambulatory Visit | Attending: Radiation Oncology | Admitting: Radiation Oncology

## 2020-04-22 ENCOUNTER — Other Ambulatory Visit: Payer: Self-pay

## 2020-04-22 DIAGNOSIS — C7931 Secondary malignant neoplasm of brain: Secondary | ICD-10-CM | POA: Diagnosis not present

## 2020-04-22 DIAGNOSIS — Z510 Encounter for antineoplastic radiation therapy: Secondary | ICD-10-CM | POA: Diagnosis present

## 2020-04-22 NOTE — Telephone Encounter (Signed)
Called and spoke with patient. Confirmed 12/13 appt and all others

## 2020-04-22 NOTE — Progress Notes (Signed)
Jared Shea rested with Jared Shea for 15 minutes following his SRS treatment.  Patient denies headache, dizziness, nausea, diplopia or ringing in the ears. Denies fatigue. Patient without complaints. Understands to avoid strenuous activity for the next 24 hours and call [phone] with needs.   BP (!) 150/92 (BP Location: Right Arm, Patient Position: Sitting, Cuff Size: Normal)   Pulse 77   Temp 98.2 F (36.8 C)   Resp 20   SpO2 100%    Jared Shea M. Leonie Green, BSN

## 2020-04-25 ENCOUNTER — Other Ambulatory Visit: Payer: Self-pay

## 2020-04-25 ENCOUNTER — Inpatient Hospital Stay: Payer: Medicare HMO

## 2020-04-25 VITALS — BP 130/78 | HR 77 | Temp 97.8°F | Resp 18 | Ht 73.0 in | Wt 182.4 lb

## 2020-04-25 DIAGNOSIS — C799 Secondary malignant neoplasm of unspecified site: Secondary | ICD-10-CM

## 2020-04-25 DIAGNOSIS — C3491 Malignant neoplasm of unspecified part of right bronchus or lung: Secondary | ICD-10-CM

## 2020-04-25 DIAGNOSIS — Z5111 Encounter for antineoplastic chemotherapy: Secondary | ICD-10-CM | POA: Diagnosis not present

## 2020-04-25 LAB — CBC WITH DIFFERENTIAL (CANCER CENTER ONLY)
Abs Immature Granulocytes: 0.02 10*3/uL (ref 0.00–0.07)
Basophils Absolute: 0 10*3/uL (ref 0.0–0.1)
Basophils Relative: 0 %
Eosinophils Absolute: 0.1 10*3/uL (ref 0.0–0.5)
Eosinophils Relative: 2 %
HCT: 34.3 % — ABNORMAL LOW (ref 39.0–52.0)
Hemoglobin: 11.5 g/dL — ABNORMAL LOW (ref 13.0–17.0)
Immature Granulocytes: 0 %
Lymphocytes Relative: 16 %
Lymphs Abs: 0.8 10*3/uL (ref 0.7–4.0)
MCH: 31.9 pg (ref 26.0–34.0)
MCHC: 33.5 g/dL (ref 30.0–36.0)
MCV: 95 fL (ref 80.0–100.0)
Monocytes Absolute: 0.5 10*3/uL (ref 0.1–1.0)
Monocytes Relative: 11 %
Neutro Abs: 3.5 10*3/uL (ref 1.7–7.7)
Neutrophils Relative %: 71 %
Platelet Count: 281 10*3/uL (ref 150–400)
RBC: 3.61 MIL/uL — ABNORMAL LOW (ref 4.22–5.81)
RDW: 17.4 % — ABNORMAL HIGH (ref 11.5–15.5)
WBC Count: 5 10*3/uL (ref 4.0–10.5)
nRBC: 0 % (ref 0.0–0.2)

## 2020-04-25 LAB — CMP (CANCER CENTER ONLY)
ALT: 19 U/L (ref 0–44)
AST: 23 U/L (ref 15–41)
Albumin: 4.1 g/dL (ref 3.5–5.0)
Alkaline Phosphatase: 67 U/L (ref 38–126)
Anion gap: 7 (ref 5–15)
BUN: 8 mg/dL (ref 8–23)
CO2: 26 mmol/L (ref 22–32)
Calcium: 9.5 mg/dL (ref 8.9–10.3)
Chloride: 108 mmol/L (ref 98–111)
Creatinine: 0.77 mg/dL (ref 0.61–1.24)
GFR, Estimated: >60 mL/min (ref >60)
Glucose, Bld: 120 mg/dL — ABNORMAL HIGH (ref 70–99)
Potassium: 3.9 mmol/L (ref 3.5–5.1)
Sodium: 141 mmol/L (ref 135–145)
Total Bilirubin: 0.6 mg/dL (ref 0.3–1.2)
Total Protein: 7.9 g/dL (ref 6.5–8.1)

## 2020-04-25 LAB — TSH: TSH: 2.373 u[IU]/mL (ref 0.320–4.118)

## 2020-04-25 MED ORDER — SODIUM CHLORIDE 0.9 % IV SOLN
Freq: Once | INTRAVENOUS | Status: AC
  Filled 2020-04-25: qty 250

## 2020-04-25 MED ORDER — SODIUM CHLORIDE 0.9 % IV SOLN: 350.0000 mg | Freq: Once | INTRAVENOUS | Status: DC

## 2020-04-25 MED ORDER — HEPARIN SOD (PORK) LOCK FLUSH 100 UNIT/ML IV SOLN
500.0000 [IU] | Freq: Once | INTRAVENOUS | Status: AC | PRN
  Administered 2020-04-25: 500 [IU]
  Filled 2020-04-25: qty 5

## 2020-04-25 MED ORDER — SODIUM CHLORIDE 0.9% FLUSH
10.0000 mL | INTRAVENOUS | Status: DC | PRN
  Administered 2020-04-25: 10 mL
  Filled 2020-04-25: qty 10

## 2020-04-25 NOTE — Progress Notes (Signed)
Patient did not receive treatment today as waiting on prior auth.  Patient discharged in stable condition with no complaints.

## 2020-04-25 NOTE — Patient Instructions (Signed)

## 2020-04-26 ENCOUNTER — Encounter: Payer: Self-pay | Admitting: Neurology

## 2020-04-26 ENCOUNTER — Ambulatory Visit: Payer: Medicare Other | Admitting: Neurology

## 2020-04-26 NOTE — Progress Notes (Deleted)
PATIENT: Jared Simmonds. DOB: Sep 22, 1955  REASON FOR VISIT: follow up HISTORY FROM: patient  HISTORY OF PRESENT ILLNESS: Today 04/26/20  HISTORY   Marquez Ceesay. is a 64 year old male, seen in request by his primary care physician Dr.Agyei, Obed K, for evaluation of complex partial seizure, metastatic brain lesion, initial evaluation was on December 23, 2019.  I reviewed and summarized the referring note. He is a longtime smoker, works as [name]  In July 2021, at work, while hooking up a wall socket, he suddenly felt hot, then started to have next month twitching lasting for 5 minutes, followed by confusion afterwards, then went away  Second episode was 2 weeks later, he woke up from sleep with left muscle twitching, difficulty breathing, lasting for 5 minutes, again followed by postevent confusion, lasting few more minutes  She was diagnosed with complex partial seizure, personally reviewed CT, and MRI of the brain with and without contrast December 14, 2019, right posterior frontal cortex 13.5 mm enhancing lesion with surrounding vasogenic edema, most likely a solitary metastatic.  He has radiation oncology appointment pending with Dr. Lisbeth Renshaw on December 24, 2019  CT of the chest/pelvis with without contrast December 14, 2019, mediastinum and left hilar adenopathy, 4 mm left upper lobe nodule, low attenuating focus inferior to left hilum likely enlarged lymph node  He had transbronchial needle aspiration with flexible bronchoscopy under endobronchial ultrasound guidance on December 21 2019, pathology confirmed malignant cells consistent with metastatic non-small cell carcinoma  Laboratory evaluation August 2021, negative HIV, BMP, CBC  He complains of frequent headaches, dizziness, fatigue taking Keppra 750 twice a day, there was no recurrent seizure  Update April 26, 2020 SS:   REVIEW OF SYSTEMS: Out of a complete 14 system review of symptoms, the patient complains only of  the following symptoms, and all other reviewed systems are negative.  ALLERGIES: Allergies  Allergen Reactions  . Penicillins Rash    Did it involve swelling of the face/tongue/throat, SOB, or low BP? N Did it involve sudden or severe rash/hives, skin peeling, or any reaction on the inside of your mouth or nose? Y Did you need to seek medical attention at a hospital or doctor's office? Y When did it last happen?childhood If all above answers are "NO", may proceed with cephalosporin use.     HOME MEDICATIONS: Outpatient Medications Prior to Visit  Medication Sig Dispense Refill  . levETIRAcetam (KEPPRA) 750 MG tablet Take 1 tablet (750 mg total) by mouth 2 (two) times daily. 180 tablet 4  . losartan (COZAAR) 25 MG tablet Take 1 tablet (25 mg total) by mouth daily. 30 tablet 2  . mirtazapine (REMERON) 15 MG tablet Take 1 tablet (15 mg total) by mouth at bedtime. 30 tablet 2  . omeprazole (PRILOSEC) 20 MG capsule Take 1 capsule (20 mg total) by mouth daily. 30 capsule 2   No facility-administered medications prior to visit.    PAST MEDICAL HISTORY: Past Medical History:  Diagnosis Date  . Brain mass   . Bursitis of right hip   . Chest pain 08/12/2018  . Chronic cough   . Dependence on nicotine from cigarettes   . Diabetes mellitus without complication ([place])   . Essential hypertension 07/28/2018  . nscl ca dx'd 12/2019  . Seizures ([place])   . Viral illness 03/23/2019    PAST SURGICAL HISTORY: Past Surgical History:  Procedure Laterality Date  . APPLICATION OF CRANIAL NAVIGATION N/A 01/07/2020   Procedure: APPLICATION OF CRANIAL NAVIGATION;  Surgeon: Ashok Pall, MD;  Location: [facility];  Service: Neurosurgery;  Laterality: N/A;  . CRANIOTOMY Right 01/07/2020   Procedure: RIGHT CRANIOTOMY FOR TUMOR RESECTION;  Surgeon: Ashok Pall, MD;  Location: [facility];  Service: Neurosurgery;  Laterality: Right;  rm 21  . ENDOBRONCHIAL ULTRASOUND N/A 12/21/2019   Procedure: ENDOBRONCHIAL  ULTRASOUND;  Surgeon: Laurin Coder, MD;  Location: [facility];  Service: Pulmonary;  Laterality: N/A;  . FINE NEEDLE ASPIRATION  12/21/2019   Procedure: FINE NEEDLE ASPIRATION (FNA) LINEAR;  Surgeon: Laurin Coder, MD;  Location: [facility];  Service: Pulmonary;;  . IR IMAGING GUIDED PORT INSERTION  03/03/2020  . NO PAST SURGERIES    . VIDEO BRONCHOSCOPY N/A 12/21/2019   Procedure: VIDEO BRONCHOSCOPY WITHOUT FLUORO;  Surgeon: Laurin Coder, MD;  Location: [facility];  Service: Pulmonary;  Laterality: N/A;    FAMILY HISTORY: Family History  Problem Relation Age of Onset  . Heart disease Mother        CABG  . Kidney disease Sister   . Diabetes Brother   . Kidney disease Brother        KIDNEY TRANSPLANT  . Hypertension Sister   . Healthy Daughter   . Healthy Daughter   . Healthy Son     SOCIAL HISTORY: Social History   Socioeconomic History  . Marital status: Divorced    Spouse name: Not on file  . Number of children: Not on file  . Years of education: Not on file  . Highest education level: Not on file  Occupational History  . Not on file  Tobacco Use  . Smoking status: Former Smoker    Packs/day: 1.00    Years: 48.00    Pack years: 48.00    Types: Cigarettes    Start date: 08/07/1970    Quit date: 12/15/2019    Years since quitting: 0.3  . Smokeless tobacco: Never Used  Vaping Use  . Vaping Use: Never used  Substance and Sexual Activity  . Alcohol use: Not Currently  . Drug use: Never  . Sexual activity: Not on file    Comment: YES  Other Topics Concern  . Not on file  Social History Narrative  . Not on file   Social Determinants of Health   Financial Resource Strain: High Risk  . Difficulty of Paying Living Expenses: Hard  Food Insecurity: No Food Insecurity  . Worried About Charity fundraiser in the Last Year: Never true  . Ran Out of Food in the Last Year: Never true  Transportation Needs: Unmet Transportation Needs  . Lack of  Transportation (Medical): Yes  . Lack of Transportation (Non-Medical): Yes  Physical Activity: Not on file  Stress: Not on file  Social Connections: Not on file  Intimate Partner Violence: Not on file      PHYSICAL EXAM  There were no vitals filed for this visit. There is no height or weight on file to calculate BMI.  Generalized: Well developed, in no acute distress   Neurological examination  Mentation: Alert oriented to time, place, history taking. Follows all commands speech and language fluent Cranial nerve II-XII: Pupils were equal round reactive to light. Extraocular movements were full, visual field were full on confrontational test. Facial sensation and strength were normal. Uvula tongue midline. Head turning and shoulder shrug  were normal and symmetric. Motor: The motor testing reveals 5 over 5 strength of all 4 extremities. Good symmetric motor tone is noted throughout.  Sensory: Sensory testing  is intact to soft touch on all 4 extremities. No evidence of extinction is noted.  Coordination: Cerebellar testing reveals good finger-nose-finger and heel-to-shin bilaterally.  Gait and station: Gait is normal. Tandem gait is normal. Romberg is negative. No drift is seen.  Reflexes: Deep tendon reflexes are symmetric and normal bilaterally.   DIAGNOSTIC DATA (LABS, IMAGING, TESTING) - I reviewed patient records, labs, notes, testing and imaging myself where available.  Lab Results  Component Value Date   WBC 5.0 04/25/2020   HGB 11.5 (L) 04/25/2020   HCT 34.3 (L) 04/25/2020   MCV 95.0 04/25/2020   PLT 281 04/25/2020      Component Value Date/Time   NA 141 04/25/2020 0716   NA 140 11/23/2019 1516   K 3.9 04/25/2020 0716   CL 108 04/25/2020 0716   CO2 26 04/25/2020 0716   GLUCOSE 120 (H) 04/25/2020 0716   BUN 8 04/25/2020 0716   BUN 14 11/23/2019 1516   CREATININE 0.77 04/25/2020 0716   CALCIUM 9.5 04/25/2020 0716   PROT 7.9 04/25/2020 0716   ALBUMIN 4.1  04/25/2020 0716   AST 23 04/25/2020 0716   ALT 19 04/25/2020 0716   ALKPHOS 67 04/25/2020 0716   BILITOT 0.6 04/25/2020 0716   GFRNONAA >60 04/25/2020 0716   GFRAA >60 02/15/2020 1000   No results found for: CHOL, HDL, LDLCALC, LDLDIRECT, TRIG, CHOLHDL Lab Results  Component Value Date   HGBA1C 4.4 04/11/2020   No results found for: TFTDDUKG25 Lab Results  Component Value Date   TSH 2.373 04/25/2020      ASSESSMENT AND PLAN 64 y.o. year old male  has a past medical history of Brain mass, Bursitis of right hip, Chest pain (08/12/2018), Chronic cough, Dependence on nicotine from cigarettes, Diabetes mellitus without complication ([place]), Essential hypertension (07/28/2018), nscl ca (dx'd 12/2019), Seizures ([place]), and Viral illness (03/23/2019). here with:  1.  Complex partial seizure due to metastatic right posterior frontal lesion -Continue Keppra 750 mg twice a day -EEG was ordered, but is yet to be completed  2.  Metastatic brain lesion -Pathology confirmed non-small cell carcinoma    I spent 15 minutes with the patient. 50% of this time was spent   Butler Denmark, [place], DNP 04/26/2020, 5:56 AM [facility] [address], [place], [place] [address] [phone]

## 2020-04-29 ENCOUNTER — Other Ambulatory Visit: Payer: Self-pay

## 2020-04-29 ENCOUNTER — Inpatient Hospital Stay: Payer: Medicare HMO

## 2020-04-29 VITALS — BP 125/75 | HR 81 | Temp 97.8°F | Resp 18

## 2020-04-29 DIAGNOSIS — C799 Secondary malignant neoplasm of unspecified site: Secondary | ICD-10-CM

## 2020-04-29 DIAGNOSIS — Z5111 Encounter for antineoplastic chemotherapy: Secondary | ICD-10-CM | POA: Diagnosis not present

## 2020-04-29 MED ORDER — SODIUM CHLORIDE 0.9 % IV SOLN
Freq: Once | INTRAVENOUS | Status: AC
  Filled 2020-04-29: qty 250

## 2020-04-29 MED ORDER — SODIUM CHLORIDE 0.9% FLUSH
10.0000 mL | INTRAVENOUS | Status: DC | PRN
  Administered 2020-04-29: 10 mL
  Filled 2020-04-29: qty 10

## 2020-04-29 MED ORDER — HEPARIN SOD (PORK) LOCK FLUSH 100 UNIT/ML IV SOLN
500.0000 [IU] | Freq: Once | INTRAVENOUS | Status: AC | PRN
  Administered 2020-04-29: 500 [IU]
  Filled 2020-04-29: qty 5

## 2020-04-29 MED ORDER — CEMIPLIMAB-RWLC CHEMO INJECTION 350 MG/7ML
350.0000 mg | Freq: Once | INTRAVENOUS | Status: AC
  Administered 2020-04-29: 350 mg via INTRAVENOUS
  Filled 2020-04-29: qty 7

## 2020-04-29 NOTE — Patient Instructions (Signed)
[place] Discharge Instructions for Patients Receiving Chemotherapy  Today you received the following chemotherapy agents Libtayo  To help prevent nausea and vomiting after your treatment, we encourage you to take your nausea medication as directed.    If you develop nausea and vomiting that is not controlled by your nausea medication, call the clinic.   BELOW ARE SYMPTOMS THAT SHOULD BE REPORTED IMMEDIATELY:  *FEVER GREATER THAN 100.5 F  *CHILLS WITH OR WITHOUT FEVER  NAUSEA AND VOMITING THAT IS NOT CONTROLLED WITH YOUR NAUSEA MEDICATION  *UNUSUAL SHORTNESS OF BREATH  *UNUSUAL BRUISING OR BLEEDING  TENDERNESS IN MOUTH AND THROAT WITH OR WITHOUT PRESENCE OF ULCERS  *URINARY PROBLEMS  *BOWEL PROBLEMS  UNUSUAL RASH Items with * indicate a potential emergency and should be followed up as soon as possible.  Feel free to call the clinic should you have any questions or concerns. The clinic phone number is [phone].  Please show the [facility] at check-in to the Emergency Department and triage nurse.  Cemiplimab injection What is this medicine? CEMIPLIMAB (se mip li mab) is a monoclonal antibody. It is used to treat cutaneous squamous cell carcinoma. This medicine may be used for other purposes; ask your health care provider or pharmacist if you have questions. COMMON BRAND NAME(S): LIBTAYO What should I tell my health care provider before I take this medicine? They need to know if you have any of these conditions:  diabetes  immune system problems like lupus  inflammatory bowel disease  kidney disease  liver disease  lung or breathing disease  organ transplant  an unusual or allergic reaction to cemiplimab, other medicines, foods, dyes, or preservatives  pregnant or trying to get pregnant  breast-feeding How should I use this medicine? This medicine is for infusion into a vein. It is given by a health care professional in a  hospital or clinic setting. A special MedGuide will be given to you before each treatment. Be sure to read this information carefully each time. Talk to your pediatrician regarding the use of this medicine in children. Special care may be needed. Overdosage: If you think you have taken too much of this medicine contact a poison control center or emergency room at once. NOTE: This medicine is only for you. Do not share this medicine with others. What if I miss a dose? It is important not to miss your dose. Call your doctor or health care professional if you are unable to keep an appointment. What may interact with this medicine? Interactions have not been studied. Give your health care provider a list of all the medicines, herbs, non-prescription drugs, or dietary supplements you use. Also tell them if you smoke, drink alcohol, or use illegal drugs. Some items may interact with your medicine. This list may not describe all possible interactions. Give your health care provider a list of all the medicines, herbs, non-prescription drugs, or dietary supplements you use. Also tell them if you smoke, drink alcohol, or use illegal drugs. Some items may interact with your medicine. What should I watch for while using this medicine? Your condition will be monitored carefully while you are receiving this medicine. You may need blood work done while you are taking this medicine. Do not become pregnant while taking this medicine or for at least 4 months after stopping it. Women should inform their doctor if they wish to become pregnant or think they might be pregnant. There is a potential for serious side effects to  an unborn child. Talk to your health care professional or pharmacist for more information. Do not breast-feed an infant while taking this medicine or for at least 4 months after the last dose. What side effects may I notice from receiving this medicine? Side effects that you should report to your  doctor or health care professional as soon as possible:  allergic reactions like skin rash, itching or hives; swelling of the face, lips, or tongue  black, tarry stools  bloody or watery diarrhea  breathing problems  changes in vision  changes in voice  chest pain or chest tightness  chills  cough  dizziness  fast or irregular heart beat  feeling faint or lightheaded  hair loss  increased hunger or thirst  muscle weakness  persistent headache  redness, blistering, peeling or loosening of the skin, including inside the mouth  signs and symptoms of kidney injury like trouble passing urine or change in the amount of urine  signs and symptoms of liver injury like dark yellow or brown urine; general ill feeling or flu-like symptoms; light-colored stools; loss of appetite; nausea; right upper belly pain; unusually weak or tired; yellowing of the eyes or skin  stomach pain  unusual bleeding or bruising  weight gain or weight loss  unusual sweating Side effects that usually do not require medical attention (report these to your doctor or health care professional if they continue or are bothersome):  bone pain  constipation  muscle pain  tiredness This list may not describe all possible side effects. Call your doctor for medical advice about side effects. You may report side effects to FDA at 1-800-FDA-1088. Where should I keep my medicine? This drug is given in a hospital or clinic and will not be stored at home. NOTE: This sheet is a summary. It may not cover all possible information. If you have questions about this medicine, talk to your doctor, pharmacist, or health care provider.  2020 Elsevier/Gold Standard (2018-11-18 14:26:31)

## 2020-05-09 ENCOUNTER — Ambulatory Visit (INDEPENDENT_AMBULATORY_CARE_PROVIDER_SITE_OTHER): Payer: Medicare HMO | Admitting: Internal Medicine

## 2020-05-09 ENCOUNTER — Encounter: Payer: Self-pay | Admitting: Internal Medicine

## 2020-05-09 VITALS — BP 116/75 | HR 97 | Temp 97.6°F | Ht 73.0 in | Wt 186.1 lb

## 2020-05-09 DIAGNOSIS — R519 Headache, unspecified: Secondary | ICD-10-CM | POA: Diagnosis not present

## 2020-05-09 DIAGNOSIS — I10 Essential (primary) hypertension: Secondary | ICD-10-CM | POA: Diagnosis not present

## 2020-05-09 DIAGNOSIS — E119 Type 2 diabetes mellitus without complications: Secondary | ICD-10-CM

## 2020-05-09 NOTE — Assessment & Plan Note (Signed)
Hypertension: He was started on losartan 25 mg daily during his last visit November 29.  BP Readings from Last 3 Encounters:  05/09/20 116/75  04/29/20 125/75  04/25/20 130/78   Recently had a chemistry panel performed which was unremarkable.  Plan: -Continue losartan 25 mg daily

## 2020-05-09 NOTE — Patient Instructions (Signed)
Mr, Jared Shea,   Thanks for seeing me today.  I am glad your blood pressure has improved.  In regards to your headache, please keep a close watch on it.  It is reassuring that you currently have no headaches however if it reoccurs and intensifies with associated nausea, vomiting and weakness, please report to the emergency department.  Take care! Dr. Eileen Stanford  Please call the internal medicine center clinic if you have any questions or concerns, we may be able to help and keep you from a long and expensive emergency room wait. Our clinic and after hours phone number is [phone], the best time to call is Monday through Friday 9 am to 4 pm but there is always someone available 24/7 if you have an emergency. If you need medication refills please notify your pharmacy one week in advance and they will send [place] a request.   If you have not gotten the COVID vaccine, I recommend doing so:  You may get it at your local [facility] or [facility] OR To schedule an appointment for a COVID vaccine or be added to the vaccine wait list: Go to [email or web address]   OR Go to [email or web address]                  OR Call [phone]                                     OR Call [phone] and select Option 2

## 2020-05-09 NOTE — Progress Notes (Signed)
   CC: Follow-up hypertension  HPI:  [name] is a 64 y.o. with medical history significant for recently diagnosed stage IV non-small cell lung cancer, adenocarcinoma with metastasis to the brain status post craniotomy with tumor resection, radiation therapy and currently undergoing chemotherapy.  Also with medical history of hypertension presenting for follow-up.  Please see problem based charting for further details.  Past Medical History:  Diagnosis Date  . Brain mass   . Bursitis of right hip   . Chest pain 08/12/2018  . Chronic cough   . Dependence on nicotine from cigarettes   . Diabetes mellitus without complication ([place])   . Essential hypertension 07/28/2018  . nscl ca dx'd 12/2019  . Seizures ([place])   . Viral illness 03/23/2019   Review of Systems:  As per HPI  Physical Exam:  Vitals:   05/09/20 1359  BP: 116/75  Pulse: 97  Temp: 97.6 F (36.4 C)  TempSrc: Oral  SpO2: 98%  Weight: 186 lb 1.6 oz (84.4 kg)  Height: 6\' 1"  (1.854 m)   Physical Exam Vitals and nursing note reviewed.  Constitutional:      Appearance: Normal appearance.  Cardiovascular:     Rate and Rhythm: Normal rate.     Heart sounds: Normal heart sounds.  Pulmonary:     Breath sounds: Normal breath sounds.  Neurological:     Gait: Gait normal.     Comments: Neurologic exam: Mental status: A&Ox3 Cranial Nerves: III, IV, VI: Extra-occular motions intact bilaterally V, VII: Face symmetric, sensation intact in all 3 divisions  IX, X: palate rises symmetrically XI: Head turn and shoulder shrug normal bilaterally  XII: tongue midline  Motor: Strength 5/5 on all upper and lower extremities, bulk muscle and tone are normal  Gait:Normal  Sensory: Light touch intact and symmetric bilaterally  Coordination: There is no dysmetria on finger-to-nose. Rapid alternating movement test normal. Psychiatric: Normal mood and affect   Psychiatric:        Mood and Affect: Mood normal.        Behavior: Behavior normal.    .phy  Assessment & Plan:   See Encounters Tab for problem based charting.  Patient discussed with Dr. Daryll Drown

## 2020-05-09 NOTE — Assessment & Plan Note (Addendum)
Headache: He states that yesterday after eating pork, he experienced 2 episodes of right-sided headache that felt like pressure.  He rated the pain as 7/10.  During that episode, he denied nausea, vomiting, vision changes, upper or lower extremity weakness.  Currently, he denies any headache, weakness, nausea, vomiting, vision changes.  He states that this headache is different from the headaches he was experiencing when he was diagnosed with adenocarcinoma of the lung with metastasis to the brain.  On physical exams, neurological examination is unremarkable.  Assessment and plan: HA of unclear etiology, less likely migraine.  Given his history of adenocarcinoma of the lung with metastasis to the brain, I was concerned about possible mass-effect.  However, he is status post craniotomy with resection of his brain mets, radiation therapy and chemotherapy.  He does not report of systemic symptoms that would suggest brain mets and currently he denies any headaches whatsoever.  Will advise patient that if headache occurs and becomes intense with nausea, vomiting, he should give [place] a call or present to the emergency department.

## 2020-05-10 NOTE — Progress Notes (Signed)
Internal Medicine Clinic Attending  Case discussed with Dr. Agyei  At the time of the visit.  We reviewed the resident's history and exam and pertinent patient test results.  I agree with the assessment, diagnosis, and plan of care documented in the resident's note.  

## 2020-05-14 NOTE — Progress Notes (Signed)
[facility] OFFICE PROGRESS NOTE  Jean Rosenthal, MD [address] [place] [address]  DIAGNOSIS: Stage IV (TX, N2, M1 C) non-small cell lung cancer, adenocarcinoma diagnosed in August 2021 and presented with solitary brain metastasis in addition to mediastinal lymphadenopathy.  PDL1 Expression 70%  Molecular Biomarkers: No actionable mutations  PRIOR THERAPY: 1) Status post right craniotomy with tumor resection followed by SRS to solitary brain metastasis under the care of Dr. Lisbeth Renshaw and Dr. Sherral Hammers. 2) Concurrent chemoradiation with weekly carboplatin for AUC of 2 and paclitaxel 45 mg/M2. First dose 02/01/2020.Status post5cycles. Last dose was given February 29, 2020. 3) SRS to the new metastatic brain lesions under the care of Dr. Lisbeth Renshaw. Completed on 04/22/20.  CURRENT THERAPY: First-line treatment with immunotherapy with Libtayo (Cempilimab) 350 mg IV every 3 weeks.  First dose April 29, 2020.  INTERVAL HISTORY: Jared Shea. 65 y.o. male returns to clinic today for a follow-up visit. The patient is feeling fair today without any concerning complaints. In the interval since his last treatment, the patient completed SRS to the 3 new foci of enhancement consistent with metastatic disease under the care of Dr. Lisbeth Renshaw.  This was completed on 04/22/2020.  The patient has a follow-up appoint with radiation oncology on 05/23/20. He is on keppra for seizures. He states he has a headache every now and then but it "don't last but 30 minutes".  The patient also started single agent immunotherapy with Libtayo on 04/29/2020.  He tolerated this well without any concerning adverse side effects.  Today, the patient denies any fever, chills, or weight loss.  He reports night sweats which has been occurring most nights. The patient states he has dyspnea with exertion. He also gets intermittent right sided chest pain since being diagnosed with cancer. His pain is exacerbated by  certain movements such as laying on his stomach. Denies any leg swelling. He has a mild cough "every now and then". He denies any nausea, vomiting, diarrhea, or constipation. He denies any rashes or skin changes.  He is here today for evaluation before starting cycle #2.    MEDICAL HISTORY: Past Medical History:  Diagnosis Date  . Brain mass   . Bursitis of right hip   . Chest pain 08/12/2018  . Chronic cough   . Dependence on nicotine from cigarettes   . Diabetes mellitus without complication ([place])   . Essential hypertension 07/28/2018  . nscl ca dx'd 12/2019  . Seizures ([place])   . Viral illness 03/23/2019    ALLERGIES:  is allergic to penicillins.  MEDICATIONS:  Current Outpatient Medications  Medication Sig Dispense Refill  . levETIRAcetam (KEPPRA) 750 MG tablet Take 1 tablet (750 mg total) by mouth 2 (two) times daily. 180 tablet 4  . losartan (COZAAR) 25 MG tablet Take 1 tablet (25 mg total) by mouth daily. 30 tablet 2  . mirtazapine (REMERON) 15 MG tablet Take 1 tablet (15 mg total) by mouth at bedtime. 30 tablet 2  . omeprazole (PRILOSEC) 20 MG capsule Take 1 capsule (20 mg total) by mouth daily. 30 capsule 2   No current facility-administered medications for this visit.    SURGICAL HISTORY:  Past Surgical History:  Procedure Laterality Date  . APPLICATION OF CRANIAL NAVIGATION N/A 01/07/2020   Procedure: APPLICATION OF CRANIAL NAVIGATION;  Surgeon: Jared Pall, MD;  Location: [facility];  Service: Neurosurgery;  Laterality: N/A;  . CRANIOTOMY Right 01/07/2020   Procedure: RIGHT CRANIOTOMY FOR TUMOR RESECTION;  Surgeon: Jared Pall, MD;  Location: [facility];  Service: Neurosurgery;  Laterality: Right;  rm 21  . ENDOBRONCHIAL ULTRASOUND N/A 12/21/2019   Procedure: ENDOBRONCHIAL ULTRASOUND;  Surgeon: Laurin Coder, MD;  [facility];  Service: Pulmonary;  Laterality: N/A;  . FINE NEEDLE ASPIRATION  12/21/2019   Procedure: FINE NEEDLE ASPIRATION (FNA) LINEAR;  Surgeon:  Laurin Coder, MD;  Location[facility];  Service: Pulmonary;;  . IR IMAGING GUIDED PORT INSERTION  03/03/2020  . NO PAST SURGERIES    . VIDEO BRONCHOSCOPY N/A 12/21/2019   Procedure: VIDEO BRONCHOSCOPY WITHOUT FLUORO;  Surgeon: Laurin Coder, MD;  [facility];  Service: Pulmonary;  Laterality: N/A;    REVIEW OF SYSTEMS:   Review of Systems  Constitutional: Positive for fatigue. Negative for appetite change, chills, fever and unexpected weight change.  HENT: Negative for mouth sores, nosebleeds, sore throat and trouble swallowing.   Eyes: Negative for eye problems and icterus.  Respiratory: Positive for DOE and mild cough. Negative for hemoptysis and wheezing.   Cardiovascular: Positive for occasional intermittent right sided chest pain. Negative for leg swelling.  Gastrointestinal: Negative for abdominal pain, constipation, diarrhea, nausea and vomiting.  Genitourinary: Negative for bladder incontinence, difficulty urinating, dysuria, frequency and hematuria.   Musculoskeletal: Negative for back pain, gait problem, neck pain and neck stiffness.  Skin: Negative for itching and rash.  Neurological: Negative for dizziness, extremity weakness, gait problem, headaches, light-headedness and seizures.  Hematological: Negative for adenopathy. Does not bruise/bleed easily.  Psychiatric/Behavioral: Negative for confusion, depression and sleep disturbance. The patient is not nervous/anxious.     PHYSICAL EXAMINATION:  There were no vitals taken for this visit.  ECOG PERFORMANCE STATUS: 1 - Symptomatic but completely ambulatory  Physical Exam  Constitutional: Oriented to person, place, and time and well-developed, well-nourished, and in no distress.  HENT:  Head: Normocephalic and atraumatic.  Mouth/Throat: Oropharynx is clear and moist. No oropharyngeal exudate.  Eyes: Conjunctivae are normal. Right eye exhibits no discharge. Left eye exhibits no discharge. No scleral  icterus.  Neck: Normal range of motion. Neck supple.  Cardiovascular: Normal rate, regular rhythm, normal heart sounds and intact distal pulses.   Pulmonary/Chest: Effort normal and breath sounds normal. No respiratory distress. No wheezes. No rales.  Abdominal: Soft. Bowel sounds are normal. Exhibits no distension and no mass. There is no tenderness.  Musculoskeletal: Normal range of motion. Exhibits no edema.  Lymphadenopathy:    No cervical adenopathy.  Neurological: Alert and oriented to person, place, and time. Exhibits normal muscle tone. Gait normal. Coordination normal.  Skin: Skin is warm and dry. No rash noted. Not diaphoretic. No erythema. No pallor.  Psychiatric: Mood, memory and judgment normal.  Vitals reviewed.  LABORATORY DATA: Lab Results  Component Value Date   WBC 5.0 04/25/2020   HGB 11.5 (L) 04/25/2020   HCT 34.3 (L) 04/25/2020   MCV 95.0 04/25/2020   PLT 281 04/25/2020      Chemistry      Component Value Date/Time   NA 141 04/25/2020 0716   NA 140 11/23/2019 1516   K 3.9 04/25/2020 0716   CL 108 04/25/2020 0716   CO2 26 04/25/2020 0716   BUN 8 04/25/2020 0716   BUN 14 11/23/2019 1516   CREATININE 0.77 04/25/2020 0716      Component Value Date/Time   CALCIUM 9.5 04/25/2020 0716   ALKPHOS 67 04/25/2020 0716   AST 23 04/25/2020 0716   ALT 19 04/25/2020 0716   BILITOT  0.6 04/25/2020 0716       RADIOGRAPHIC STUDIES:  No results found.   ASSESSMENT/PLAN:  This is a very pleasant 65 year old African-American male diagnosed with stage IV (Tx, N2, M1c)non-small cell lung cancer, adenocarcinoma.Hepresented with a solitary brain metastasis in addition to right hilar and mediastinal lymphadenopathy. Hewas diagnosed in August 2021.   The patient is status post right craniotomy with resection of the solitary brain metastasis with SRS under the care of Dr. Lisbeth Renshaw and Dr. Christella Noa.  He completed weekly concurrent chemoradiation with carboplatin for  an AUC of 2 and paclitaxel 45 mg per metered square.  He is status post 5 cycles.  He tolerated it well except for some dysphagia/odynophagia.  The patient then had evidence of new brain metastases.  He is status post SRS to the new lesions on 04/22/2020 under the care of Dr. Lisbeth Renshaw.  The patient is currently undergoing immunotherapy with Libtayo IV every 3 weeks.  He is status post 1 cycle and tolerated it well.  The patient was seen with Dr. Julien Nordmann today.  Labs were reviewed.  Recommend he proceed with cycle #2 today scheduled.  We will see him back for a follow up appointment in 3 weeks for evaluation before starting #3.   He will have a follow up appointment with radiation oncology on 05/23/20 as scheduled for his history of brain metastases.   The patient was advised to call immediately if he has any concerning symptoms in the interval. The patient voices understanding of current disease status and treatment options and is in agreement with the current care plan. All questions were answered. The patient knows to call the clinic with any problems, questions or concerns. We can certainly see the patient much sooner if necessary   No orders of the defined types were placed in this encounter.    Cassandra L Heilingoetter, PA-C 05/14/20  ADDENDUM: Hematology/Oncology Attending: I had a face-to-face encounter with the patient today.  I recommended his care plan.  This is a very pleasant 65 years old African-American male diagnosed with stage IV non-small cell lung cancer, adenocarcinoma with no actionable mutation and PD-L1 expression of 70%.  The patient was initially treated with craniotomy for the brain metastasis in addition to a course of concurrent chemoradiation for the locally advanced disease. He is currently undergoing the first line treatment with immunotherapy with Libtayo (Cempilimab) status post 1 cycle.  He tolerated the first cycle of his treatment fairly well with no  concerning adverse effects. I recommended for the patient to proceed with cycle #2 today as planned. The patient will come back for follow-up visit in 3 weeks for evaluation before starting cycle #3. We will arrange for him to have repeat CT scan for restaging of his disease after cycle #3. The patient was advised to call immediately if he has any concerning symptoms in the interval.  Disclaimer: This note was dictated with voice recognition software. Similar sounding words can inadvertently be transcribed and may be missed upon review. Eilleen Kempf, MD 05/16/20

## 2020-05-16 ENCOUNTER — Inpatient Hospital Stay: Payer: Medicare HMO

## 2020-05-16 ENCOUNTER — Inpatient Hospital Stay: Payer: Medicare HMO | Attending: Physician Assistant | Admitting: Physician Assistant

## 2020-05-16 ENCOUNTER — Other Ambulatory Visit: Payer: Self-pay

## 2020-05-16 VITALS — BP 107/81 | HR 90 | Temp 98.4°F | Resp 18 | Ht 73.0 in | Wt 184.4 lb

## 2020-05-16 DIAGNOSIS — C7931 Secondary malignant neoplasm of brain: Secondary | ICD-10-CM | POA: Diagnosis not present

## 2020-05-16 DIAGNOSIS — C3490 Malignant neoplasm of unspecified part of unspecified bronchus or lung: Secondary | ICD-10-CM | POA: Insufficient documentation

## 2020-05-16 DIAGNOSIS — Z5111 Encounter for antineoplastic chemotherapy: Secondary | ICD-10-CM | POA: Diagnosis present

## 2020-05-16 DIAGNOSIS — C779 Secondary and unspecified malignant neoplasm of lymph node, unspecified: Secondary | ICD-10-CM | POA: Diagnosis not present

## 2020-05-16 DIAGNOSIS — Z5112 Encounter for antineoplastic immunotherapy: Secondary | ICD-10-CM | POA: Diagnosis not present

## 2020-05-16 DIAGNOSIS — E119 Type 2 diabetes mellitus without complications: Secondary | ICD-10-CM | POA: Insufficient documentation

## 2020-05-16 DIAGNOSIS — Z79899 Other long term (current) drug therapy: Secondary | ICD-10-CM | POA: Insufficient documentation

## 2020-05-16 DIAGNOSIS — I10 Essential (primary) hypertension: Secondary | ICD-10-CM | POA: Diagnosis not present

## 2020-05-16 DIAGNOSIS — G441 Vascular headache, not elsewhere classified: Secondary | ICD-10-CM | POA: Diagnosis not present

## 2020-05-16 DIAGNOSIS — Z87891 Personal history of nicotine dependence: Secondary | ICD-10-CM | POA: Insufficient documentation

## 2020-05-16 DIAGNOSIS — F17210 Nicotine dependence, cigarettes, uncomplicated: Secondary | ICD-10-CM | POA: Insufficient documentation

## 2020-05-16 DIAGNOSIS — C3491 Malignant neoplasm of unspecified part of right bronchus or lung: Secondary | ICD-10-CM

## 2020-05-16 DIAGNOSIS — J0110 Acute frontal sinusitis, unspecified: Secondary | ICD-10-CM | POA: Diagnosis not present

## 2020-05-16 DIAGNOSIS — Z8669 Personal history of other diseases of the nervous system and sense organs: Secondary | ICD-10-CM | POA: Diagnosis not present

## 2020-05-16 DIAGNOSIS — C799 Secondary malignant neoplasm of unspecified site: Secondary | ICD-10-CM

## 2020-05-16 LAB — CMP (CANCER CENTER ONLY)
ALT: 21 U/L (ref 0–44)
AST: 21 U/L (ref 15–41)
Albumin: 4 g/dL (ref 3.5–5.0)
Alkaline Phosphatase: 78 U/L (ref 38–126)
Anion gap: 6 (ref 5–15)
BUN: 10 mg/dL (ref 8–23)
CO2: 25 mmol/L (ref 22–32)
Calcium: 9.3 mg/dL (ref 8.9–10.3)
Chloride: 108 mmol/L (ref 98–111)
Creatinine: 0.8 mg/dL (ref 0.61–1.24)
GFR, Estimated: >60 mL/min (ref >60)
Glucose, Bld: 106 mg/dL — ABNORMAL HIGH (ref 70–99)
Potassium: 3.6 mmol/L (ref 3.5–5.1)
Sodium: 139 mmol/L (ref 135–145)
Total Bilirubin: 0.8 mg/dL (ref 0.3–1.2)
Total Protein: 8 g/dL (ref 6.5–8.1)

## 2020-05-16 LAB — CBC WITH DIFFERENTIAL (CANCER CENTER ONLY)
Abs Immature Granulocytes: 0.04 10*3/uL (ref 0.00–0.07)
Basophils Absolute: 0 10*3/uL (ref 0.0–0.1)
Basophils Relative: 0 %
Eosinophils Absolute: 0.1 10*3/uL (ref 0.0–0.5)
Eosinophils Relative: 1 %
HCT: 33.2 % — ABNORMAL LOW (ref 39.0–52.0)
Hemoglobin: 11.7 g/dL — ABNORMAL LOW (ref 13.0–17.0)
Immature Granulocytes: 1 %
Lymphocytes Relative: 11 %
Lymphs Abs: 0.8 10*3/uL (ref 0.7–4.0)
MCH: 31.2 pg (ref 26.0–34.0)
MCHC: 35.2 g/dL (ref 30.0–36.0)
MCV: 88.5 fL (ref 80.0–100.0)
Monocytes Absolute: 0.7 10*3/uL (ref 0.1–1.0)
Monocytes Relative: 10 %
Neutro Abs: 5.5 10*3/uL (ref 1.7–7.7)
Neutrophils Relative %: 77 %
Platelet Count: 360 10*3/uL (ref 150–400)
RBC: 3.75 MIL/uL — ABNORMAL LOW (ref 4.22–5.81)
RDW: 12.5 % (ref 11.5–15.5)
WBC Count: 7.1 10*3/uL (ref 4.0–10.5)
nRBC: 0 % (ref 0.0–0.2)

## 2020-05-16 LAB — TSH: TSH: 1.062 u[IU]/mL (ref 0.320–4.118)

## 2020-05-16 MED ORDER — HEPARIN SOD (PORK) LOCK FLUSH 100 UNIT/ML IV SOLN
500.0000 [IU] | Freq: Once | INTRAVENOUS | Status: AC
  Administered 2020-05-16: 500 [IU] via INTRAVENOUS
  Filled 2020-05-16: qty 5

## 2020-05-16 MED ORDER — SODIUM CHLORIDE 0.9 % IV SOLN
Freq: Once | INTRAVENOUS | Status: AC
  Filled 2020-05-16: qty 250

## 2020-05-16 MED ORDER — SODIUM CHLORIDE 0.9% FLUSH
10.0000 mL | Freq: Once | INTRAVENOUS | Status: AC
  Administered 2020-05-16: 10 mL via INTRAVENOUS
  Filled 2020-05-16: qty 10

## 2020-05-16 MED ORDER — SODIUM CHLORIDE 0.9 % IV SOLN
350.0000 mg | Freq: Once | INTRAVENOUS | Status: AC
  Administered 2020-05-16: 350 mg via INTRAVENOUS
  Filled 2020-05-16: qty 7

## 2020-05-16 NOTE — Patient Instructions (Signed)
[facility] Discharge Instructions for Patients Receiving Chemotherapy  Today you received the following chemotherapy agents libtayo  To help prevent nausea and vomiting after your treatment, we encourage you to take your nausea medication as directed.   If you develop nausea and vomiting that is not controlled by your nausea medication, call the clinic.   BELOW ARE SYMPTOMS THAT SHOULD BE REPORTED IMMEDIATELY:  *FEVER GREATER THAN 100.5 F  *CHILLS WITH OR WITHOUT FEVER  NAUSEA AND VOMITING THAT IS NOT CONTROLLED WITH YOUR NAUSEA MEDICATION  *UNUSUAL SHORTNESS OF BREATH  *UNUSUAL BRUISING OR BLEEDING  TENDERNESS IN MOUTH AND THROAT WITH OR WITHOUT PRESENCE OF ULCERS  *URINARY PROBLEMS  *BOWEL PROBLEMS  UNUSUAL RASH Items with * indicate a potential emergency and should be followed up as soon as possible.  Feel free to call the clinic should you have any questions or concerns. The clinic phone number is [phone].  Please show the [facility] at check-in to the Emergency Department and triage nurse.

## 2020-05-16 NOTE — Patient Instructions (Signed)

## 2020-05-17 ENCOUNTER — Telehealth: Payer: Self-pay | Admitting: Physician Assistant

## 2020-05-17 NOTE — Telephone Encounter (Signed)
Scheduled appts per 1/3 los. Pt confirmed appt date and time.

## 2020-05-19 ENCOUNTER — Inpatient Hospital Stay (HOSPITAL_BASED_OUTPATIENT_CLINIC_OR_DEPARTMENT_OTHER): Payer: Medicare HMO | Admitting: Medical

## 2020-05-19 ENCOUNTER — Encounter: Payer: Self-pay | Admitting: Radiation Oncology

## 2020-05-19 ENCOUNTER — Other Ambulatory Visit: Payer: Self-pay

## 2020-05-19 VITALS — BP 119/74 | HR 93 | Temp 97.0°F | Resp 18 | Wt 184.8 lb

## 2020-05-19 DIAGNOSIS — G441 Vascular headache, not elsewhere classified: Secondary | ICD-10-CM | POA: Diagnosis not present

## 2020-05-19 DIAGNOSIS — J0110 Acute frontal sinusitis, unspecified: Secondary | ICD-10-CM | POA: Diagnosis not present

## 2020-05-19 DIAGNOSIS — C7931 Secondary malignant neoplasm of brain: Secondary | ICD-10-CM | POA: Diagnosis not present

## 2020-05-19 DIAGNOSIS — Z5111 Encounter for antineoplastic chemotherapy: Secondary | ICD-10-CM | POA: Diagnosis not present

## 2020-05-19 DIAGNOSIS — C799 Secondary malignant neoplasm of unspecified site: Secondary | ICD-10-CM

## 2020-05-19 MED ORDER — AZITHROMYCIN 250 MG PO TABS: ORAL_TABLET | ORAL | 0 refills | Status: DC

## 2020-05-19 MED ORDER — ACETAMINOPHEN 500 MG PO TABS: 1000.0000 mg | ORAL_TABLET | Freq: Once | ORAL | Status: DC

## 2020-05-19 MED ORDER — ACETAMINOPHEN 500 MG PO TABS
ORAL_TABLET | ORAL | Status: AC
  Filled 2020-05-19: qty 2

## 2020-05-23 ENCOUNTER — Other Ambulatory Visit: Payer: Self-pay

## 2020-05-23 ENCOUNTER — Ambulatory Visit
Admission: RE | Admit: 2020-05-23 | Discharge: 2020-05-23 | Disposition: A | Discharge status: Home / Self Care | Payer: Medicare HMO | Source: Ambulatory Visit | Attending: Radiation Oncology | Admitting: Radiation Oncology

## 2020-05-23 DIAGNOSIS — C7931 Secondary malignant neoplasm of brain: Secondary | ICD-10-CM

## 2020-05-23 DIAGNOSIS — C3491 Malignant neoplasm of unspecified part of right bronchus or lung: Secondary | ICD-10-CM

## 2020-05-23 NOTE — Progress Notes (Addendum)
  Radiation Oncology         [phone]) [phone] ________________________________  Name: Jared Shea. MRN: [id number]  Date of Service: 05/23/2020  DOB: Jun 26, 1955  Post Treatment Phone Visit:  Diagnosis:    Stage IV, NSCLC of the LUL involving the right frontal lobe of the brain  Interval Since Last Radiation: 5 weeks  04/22/20 SRS Treatment: The three sites below were each treated to 20 Gy in 1 fraction PTV2 Lt Cerebellum 79mm PTV3 Rt Occipital 67mm PTV4 Lt Temporal 78mm  01/25/20-03/14/20: The patient's left lung target and regional nodes were treated to 60 Gy in 30 fractions followed by a 6 Gy boost in 3 fractions.  01/05/20 Preop SRS Treatment: 15 Gy in 1 fraction PTV1 Rt Frontal 25mm  Narrative: The patient has a history of stage IV non-small cell cancer involving the left upper lobe and brain.  He has received preoperative SRS treatment in August 2021 followed by chemoradiation given that he had limited systemic disease in the chest which she completed in November 2021.  His case was somewhat complicated by abdominal discomfort and pain that was felt to be the result of constipation.  In surveillance of his brain, 3 additional sites were found all subcentimeter and treated with single fraction SRS on 04/22/2020.  The patient tolerated this well and is contacted today to check in.  He continues systemically receiving Libtayo infusions and has tolerated them well.  He continues with Dr. Julien Nordmann for these treatments. He reports he is having feelings of pain in his flanks and joint pains and headaches that he's been taking antibiotics for presuming this is from sinusitis. He has had fevers, and tells me he does not have covid. He is unsure if he has fevers too because he is hot some nights, but doesn't check his temp. No other complaints are noted, and he denies any urinary symptoms or bowel symptoms.    Impression/Plan: 1.  Stage IV, NSCLC of the LUL with brain metastases.  The patient appears  to clinically be doing well overall.  He will continue to follow-up with Dr. Julien Nordmann would for systemic guidance and treatment.  We would recommend repeating an MRI scan in approximately 2 months time to continue surveillance of his brain and review his case in multidisciplinary brain oncology conference.  He is in agreement with this plan and will be contacted by our navigator to coordinate these appointments.   2. Bradycardia and hypotension.  We will continue to defer to Dr. Konrad Penta and Dr. Radford Pax for any further management of this as he previously had issues with these symptoms.  3. Other symptoms as above. I encouraged him to reach out to medical oncology to help triage his symptoms as they may or may not be related to his systemic therapy. I also called his sister Jared Shea and left her a voicemail also reviewing today's phone conversation    Carola Rhine, [facility]

## 2020-05-24 ENCOUNTER — Telehealth: Payer: Self-pay

## 2020-05-24 NOTE — Progress Notes (Signed)
Symptoms Management Clinic Progress Note   [name] [id number] 06/08/1955 65 y.o.  [name]. is managed by Dr. Fanny Bien. Mohamed  Actively treated with chemotherapy/immunotherapy/hormonal therapy: yes  Current therapy: Libtayo  Last treated: 05/15/2020 (cycle #2, day #1)  Next scheduled appointment with provider: 06/06/2020  Assessment: Plan:    Other vascular headache - Plan: acetaminophen (TYLENOL) tablet 1,000 mg  Acute frontal sinusitis, recurrence not specified  Adenocarcinoma, metastatic ([place])  Brain metastases ([place])   Headache with suspected frontal sinusitis: Mr. Baba was given Tylenol 1000 mg PO x 1 and was given a prescription for Azithromycin  Metastatic adenocarcinoma of the lung with brain metastases: Mr. Nunn continues to be followed by Dr. Julien Nordmann and is status post cycle #2, day #1 of Libtayo which was dosed on 05/15/2020. He is scheduled to be seen next on 06/06/2020.  Please see After Visit Summary for patient specific instructions.  Future Appointments  Date Time Provider [place]  06/06/2020 11:15 AM [facility] LAB [facility] None  06/06/2020 11:45 AM Curt Bears, MD [facility] None  06/06/2020 12:30 PM [facility] INFUSION [facility] None  06/27/2020 11:30 AM Marcial Pacas, MD [facility] None  06/27/2020  1:15 PM [facility] LAB [facility] None  06/27/2020  1:45 PM Curt Bears, MD [facility] None  06/27/2020  2:45 PM [facility] INFUSION [facility] None  07/18/2020  2:00 PM [facility] LAB [facility] None  07/18/2020  2:30 PM Heilingoetter, Cassandra L, PA-C [facility] None  07/18/2020  3:30 PM [facility] INFUSION [facility] None  07/26/2020 11:00 AM Vaslow, Acey Lav, MD [facility] None    No orders of the defined types were placed in this encounter.      Subjective:   Patient ID:  Jared Shea. is a 65 y.o. (DOB 1955/06/09) male.  Chief Complaint: No chief complaint on file.   HPI Jared Shea.  is a  65 y.o. male with a diagnosis of a metastatic adenocarcinoma of the lung with brain metastases. He is followed by Dr. Julien Nordmann and is status post cycle #2, day #1 of Libtayo which was dosed on 05/15/2020. He presents today as a walk-in with a report of a headache over his left eye today.  He has not taken anything for his headache. He reports that he does not have headaches although his last office note indicates that he has episodic headaches. When questioned, he reports that he does have headaches. He denies fevers, chills, sweats, nausea, vomiting, cough, visual changes, or postnasal drainage.  Medications: I have reviewed the patient's current medications.  Allergies:  Allergies  Allergen Reactions  . Penicillins Rash    Did it involve swelling of the face/tongue/throat, SOB, or low BP? N Did it involve sudden or severe rash/hives, skin peeling, or any reaction on the inside of your mouth or nose? Y Did you need to seek medical attention at a hospital or doctor's office? Y When did it last happen?childhood If all above answers are "NO", may proceed with cephalosporin use.     Past Medical History:  Diagnosis Date  . Brain mass   . Bursitis of right hip   . Chest pain 08/12/2018  . Chronic cough   . Dependence on nicotine from cigarettes   . Diabetes mellitus without complication ([place])   . Essential hypertension 07/28/2018  . nscl ca dx'd 12/2019  . Seizures ([place])   . Viral illness 03/23/2019    Past Surgical History:  Procedure Laterality Date  . APPLICATION OF CRANIAL NAVIGATION N/A 01/07/2020  Procedure: APPLICATION OF CRANIAL NAVIGATION;  Surgeon: Ashok Pall, MD;  Location: [facility];  Service: Neurosurgery;  Laterality: N/A;  . CRANIOTOMY Right 01/07/2020   Procedure: RIGHT CRANIOTOMY FOR TUMOR RESECTION;  Surgeon: Ashok Pall, MD;  Location: [facility];  Service: Neurosurgery;  Laterality: Right;  rm 21  . ENDOBRONCHIAL ULTRASOUND N/A 12/21/2019   Procedure: ENDOBRONCHIAL  ULTRASOUND;  Surgeon: Laurin Coder, MD;  [facility];  Service: Pulmonary;  Laterality: N/A;  . FINE NEEDLE ASPIRATION  12/21/2019   Procedure: FINE NEEDLE ASPIRATION (FNA) LINEAR;  Surgeon: Laurin Coder, MD;  [facility];  Service: Pulmonary;;  . IR IMAGING GUIDED PORT INSERTION  03/03/2020  . NO PAST SURGERIES    . VIDEO BRONCHOSCOPY N/A 12/21/2019   Procedure: VIDEO BRONCHOSCOPY WITHOUT FLUORO;  Surgeon: Laurin Coder, MD;  [facility];  Service: Pulmonary;  Laterality: N/A;    Family History  Problem Relation Age of Onset  . Heart disease Mother        CABG  . Kidney disease Sister   . Diabetes Brother   . Kidney disease Brother        KIDNEY TRANSPLANT  . Hypertension Sister   . Healthy Daughter   . Healthy Daughter   . Healthy Son     Social History   Socioeconomic History  . Marital status: Divorced    Spouse name: Not on file  . Number of children: Not on file  . Years of education: Not on file  . Highest education level: Not on file  Occupational History  . Not on file  Tobacco Use  . Smoking status: Former Smoker    Packs/day: 1.00    Years: 48.00    Pack years: 48.00    Types: Cigarettes    Start date: 08/07/1970    Quit date: 12/15/2019    Years since quitting: 0.4  . Smokeless tobacco: Never Used  Vaping Use  . Vaping Use: Never used  Substance and Sexual Activity  . Alcohol use: Not Currently  . Drug use: Never  . Sexual activity: Not on file    Comment: YES  Other Topics Concern  . Not on file  Social History Narrative  . Not on file   Social Determinants of Health   Financial Resource Strain: High Risk  . Difficulty of Paying Living Expenses: Hard  Food Insecurity: No Food Insecurity  . Worried About Charity fundraiser in the Last Year: Never true  . Ran Out of Food in the Last Year: Never true  Transportation Needs: Unmet Transportation Needs  . Lack of Transportation (Medical): Yes  . Lack of  Transportation (Non-Medical): Yes  Physical Activity: Not on file  Stress: Not on file  Social Connections: Not on file  Intimate Partner Violence: Not on file    Past Medical History, Surgical history, Social history, and Family history were reviewed and updated as appropriate.   Please see review of systems for further details on the patient's review from today.   Review of Systems:  Review of Systems  Constitutional: Negative for chills, diaphoresis and fever.  HENT: Negative for trouble swallowing and voice change.   Eyes: Negative for photophobia and visual disturbance.  Respiratory: Negative for cough, chest tightness, shortness of breath and wheezing.   Cardiovascular: Negative for chest pain and palpitations.  Gastrointestinal: Negative for abdominal pain, constipation, diarrhea, nausea and vomiting.  Musculoskeletal: Negative for back pain and myalgias.  Neurological: Positive for headaches. Negative for  dizziness and light-headedness.    Objective:   Physical Exam:  BP 119/74   Pulse 93   Temp (!) 97 F (36.1 C) (Tympanic)   Resp 18   Wt 184 lb 12.8 oz (83.8 kg)   SpO2 100%   BMI 24.38 kg/m  ECOG: 0  Physical Exam Constitutional:      General: He is not in acute distress.    Appearance: He is not diaphoretic.  HENT:     Head: Normocephalic and atraumatic.     Nose:     Right Sinus: No maxillary sinus tenderness or frontal sinus tenderness.     Left Sinus: Frontal sinus tenderness present. No maxillary sinus tenderness.     Mouth/Throat:     Pharynx: No oropharyngeal exudate.  Eyes:     General: No scleral icterus.    Conjunctiva/sclera:     Right eye: Right conjunctiva is not injected. No exudate or hemorrhage.    Left eye: Left conjunctiva is not injected. No exudate or hemorrhage.    Funduscopic exam:    Right eye: No hemorrhage, exudate, AV nicking or papilledema.        Left eye: No hemorrhage, exudate or papilledema.  Cardiovascular:     Rate  and Rhythm: Normal rate and regular rhythm.     Heart sounds: Normal heart sounds. No murmur heard. No friction rub. No gallop.   Pulmonary:     Effort: Pulmonary effort is normal. No respiratory distress.     Breath sounds: Normal breath sounds. No wheezing or rales.  Skin:    General: Skin is warm and dry.     Findings: No erythema or rash.  Neurological:     Mental Status: He is alert.     Coordination: Coordination normal.     Gait: Gait normal.     Lab Review:     Component Value Date/Time   NA 139 05/16/2020 1111   NA 140 11/23/2019 1516   K 3.6 05/16/2020 1111   CL 108 05/16/2020 1111   CO2 25 05/16/2020 1111   GLUCOSE 106 (H) 05/16/2020 1111   BUN 10 05/16/2020 1111   BUN 14 11/23/2019 1516   CREATININE 0.80 05/16/2020 1111   CALCIUM 9.3 05/16/2020 1111   PROT 8.0 05/16/2020 1111   ALBUMIN 4.0 05/16/2020 1111   AST 21 05/16/2020 1111   ALT 21 05/16/2020 1111   ALKPHOS 78 05/16/2020 1111   BILITOT 0.8 05/16/2020 1111   GFRNONAA >60 05/16/2020 1111   GFRAA >60 02/15/2020 1000       Component Value Date/Time   WBC 7.1 05/16/2020 1111   WBC 5.7 03/02/2020 1358   RBC 3.75 (L) 05/16/2020 1111   HGB 11.7 (L) 05/16/2020 1111   HCT 33.2 (L) 05/16/2020 1111   PLT 360 05/16/2020 1111   MCV 88.5 05/16/2020 1111   MCH 31.2 05/16/2020 1111   MCHC 35.2 05/16/2020 1111   RDW 12.5 05/16/2020 1111   LYMPHSABS 0.8 05/16/2020 1111   MONOABS 0.7 05/16/2020 1111   EOSABS 0.1 05/16/2020 1111   BASOSABS 0.0 05/16/2020 1111   -------------------------------  Imaging from last 24 hours (if applicable):  Radiology interpretation: No results found.

## 2020-05-24 NOTE — Telephone Encounter (Signed)
Jared Shea from [facility] called to notify you that there are out of network for the pt and is not in net work for any of the manage care plan

## 2020-05-27 NOTE — Telephone Encounter (Signed)
Referral has been redirected to [facility].

## 2020-06-06 ENCOUNTER — Other Ambulatory Visit: Payer: Self-pay

## 2020-06-06 ENCOUNTER — Inpatient Hospital Stay: Payer: Medicare HMO

## 2020-06-06 ENCOUNTER — Inpatient Hospital Stay (HOSPITAL_BASED_OUTPATIENT_CLINIC_OR_DEPARTMENT_OTHER): Payer: Medicare HMO | Admitting: Internal Medicine

## 2020-06-06 ENCOUNTER — Encounter: Payer: Self-pay | Admitting: Internal Medicine

## 2020-06-06 VITALS — BP 131/86 | HR 94 | Temp 97.3°F | Resp 18 | Ht 73.0 in | Wt 185.5 lb

## 2020-06-06 DIAGNOSIS — C3490 Malignant neoplasm of unspecified part of unspecified bronchus or lung: Secondary | ICD-10-CM

## 2020-06-06 DIAGNOSIS — Z5112 Encounter for antineoplastic immunotherapy: Secondary | ICD-10-CM

## 2020-06-06 DIAGNOSIS — C3491 Malignant neoplasm of unspecified part of right bronchus or lung: Secondary | ICD-10-CM | POA: Diagnosis not present

## 2020-06-06 DIAGNOSIS — C7931 Secondary malignant neoplasm of brain: Secondary | ICD-10-CM | POA: Diagnosis not present

## 2020-06-06 DIAGNOSIS — C799 Secondary malignant neoplasm of unspecified site: Secondary | ICD-10-CM

## 2020-06-06 DIAGNOSIS — Z95828 Presence of other vascular implants and grafts: Secondary | ICD-10-CM

## 2020-06-06 DIAGNOSIS — Z5111 Encounter for antineoplastic chemotherapy: Secondary | ICD-10-CM | POA: Diagnosis not present

## 2020-06-06 LAB — CBC WITH DIFFERENTIAL (CANCER CENTER ONLY)
Abs Immature Granulocytes: 0.06 10*3/uL (ref 0.00–0.07)
Basophils Absolute: 0 10*3/uL (ref 0.0–0.1)
Basophils Relative: 1 %
Eosinophils Absolute: 0.5 10*3/uL (ref 0.0–0.5)
Eosinophils Relative: 6 %
HCT: 35.7 % — ABNORMAL LOW (ref 39.0–52.0)
Hemoglobin: 11.9 g/dL — ABNORMAL LOW (ref 13.0–17.0)
Immature Granulocytes: 1 %
Lymphocytes Relative: 17 %
Lymphs Abs: 1.3 10*3/uL (ref 0.7–4.0)
MCH: 30.2 pg (ref 26.0–34.0)
MCHC: 33.3 g/dL (ref 30.0–36.0)
MCV: 90.6 fL (ref 80.0–100.0)
Monocytes Absolute: 0.8 10*3/uL (ref 0.1–1.0)
Monocytes Relative: 11 %
Neutro Abs: 4.8 10*3/uL (ref 1.7–7.7)
Neutrophils Relative %: 64 %
Platelet Count: 387 10*3/uL (ref 150–400)
RBC: 3.94 MIL/uL — ABNORMAL LOW (ref 4.22–5.81)
RDW: 13 % (ref 11.5–15.5)
WBC Count: 7.5 10*3/uL (ref 4.0–10.5)
nRBC: 0 % (ref 0.0–0.2)

## 2020-06-06 LAB — CMP (CANCER CENTER ONLY)
ALT: 48 U/L — ABNORMAL HIGH (ref 0–44)
AST: 45 U/L — ABNORMAL HIGH (ref 15–41)
Albumin: 3.8 g/dL (ref 3.5–5.0)
Alkaline Phosphatase: 110 U/L (ref 38–126)
Anion gap: 7 (ref 5–15)
BUN: 16 mg/dL (ref 8–23)
CO2: 23 mmol/L (ref 22–32)
Calcium: 9.2 mg/dL (ref 8.9–10.3)
Chloride: 108 mmol/L (ref 98–111)
Creatinine: 0.79 mg/dL (ref 0.61–1.24)
GFR, Estimated: >60 mL/min (ref >60)
Glucose, Bld: 100 mg/dL — ABNORMAL HIGH (ref 70–99)
Potassium: 4.2 mmol/L (ref 3.5–5.1)
Sodium: 138 mmol/L (ref 135–145)
Total Bilirubin: 0.5 mg/dL (ref 0.3–1.2)
Total Protein: 7.9 g/dL (ref 6.5–8.1)

## 2020-06-06 LAB — TSH: TSH: 1.194 u[IU]/mL (ref 0.320–4.118)

## 2020-06-06 MED ORDER — SODIUM CHLORIDE 0.9% FLUSH
10.0000 mL | INTRAVENOUS | Status: DC | PRN
  Administered 2020-06-06: 10 mL
  Filled 2020-06-06: qty 10

## 2020-06-06 MED ORDER — SODIUM CHLORIDE 0.9 % IV SOLN
Freq: Once | INTRAVENOUS | Status: AC
  Filled 2020-06-06: qty 250

## 2020-06-06 MED ORDER — SODIUM CHLORIDE 0.9 % IV SOLN
350.0000 mg | Freq: Once | INTRAVENOUS | Status: AC
  Administered 2020-06-06: 350 mg via INTRAVENOUS
  Filled 2020-06-06: qty 7

## 2020-06-06 MED ORDER — SODIUM CHLORIDE 0.9% FLUSH
10.0000 mL | INTRAVENOUS | Status: DC | PRN
  Administered 2020-06-06: 10 mL via INTRAVENOUS
  Filled 2020-06-06: qty 10

## 2020-06-06 MED ORDER — HEPARIN SOD (PORK) LOCK FLUSH 100 UNIT/ML IV SOLN
500.0000 [IU] | Freq: Once | INTRAVENOUS | Status: AC | PRN
  Administered 2020-06-06: 500 [IU]
  Filled 2020-06-06: qty 5

## 2020-06-06 NOTE — Patient Instructions (Signed)
[facility] Discharge Instructions for Patients Receiving Chemotherapy  Today you received the following chemotherapy agents5 cemplimab   To help prevent nausea and vomiting after your treatment, we encourage you to take your nausea medication5 as directed.   If you develop nausea and vomiting that is not controlled by your nausea medication, call the clinic.   BELOW ARE SYMPTOMS THAT SHOULD BE REPORTED IMMEDIATELY:  *FEVER GREATER THAN 100.5 F  *CHILLS WITH OR WITHOUT FEVER  NAUSEA AND VOMITING THAT IS NOT CONTROLLED WITH YOUR NAUSEA MEDICATION  *UNUSUAL SHORTNESS OF BREATH  *UNUSUAL BRUISING OR BLEEDING  TENDERNESS IN MOUTH AND THROAT WITH OR WITHOUT PRESENCE OF ULCERS  *URINARY PROBLEMS  *BOWEL PROBLEMS  UNUSUAL RASH Items with * indicate a potential emergency and should be followed up as soon as possible.  Feel free to call the clinic should you have any questions or concerns. The clinic phone number is [phone].  Please show the [facility] at check-in to the Emergency Department and triage nurse.

## 2020-06-06 NOTE — Progress Notes (Signed)
[facility] [phone]   [phone]  OFFICE PROGRESS NOTE  Jean Rosenthal, MD [address] [place] [address]  DIAGNOSIS: stage IV (TX, N2, M1 C) non-small cell lung cancer, adenocarcinoma diagnosed in August 2021 and presented with solitary brain metastasis in addition to mediastinal lymphadenopathy.  PDL1 Expression 70%  Molecular Biomarkers: No actionable mutations  PRIOR THERAPY:  1) Status post right craniotomy with tumor resection followed by SRS to solitary brain metastasis under the care of Dr. Lisbeth Renshaw and Dr. Sherral Hammers. 2) Concurrent chemoradiation with weekly carboplatin for AUC of 2 and paclitaxel 45 mg/M2. First dose 02/01/2020.Status post 5 cycles. Last dose was given February 29, 2020.  CURRENT THERAPY:  First-line treatment with immunotherapy with Libtayo (Cempilimab) 350 mg IV every 3 weeks.  First dose April 25, 2020.  Status post 2 cycles.   INTERVAL HISTORY: Hawthorne Day. 65 y.o. male returns to the clinic today for follow-up visit.  His sister was available by phone during the visit.  The patient is feeling fine today with no concerning complaints except for intermittent chest pain as well as arthralgia.  His chest pain is not associated with any shortness of breath or diaphoresis.  He takes ibuprofen with some relief.  He denied having any current shortness of breath but has mild cough productive of whitish sputum with no hemoptysis.  He denied having any fever or chills.  He has no nausea, vomiting, diarrhea or constipation.  He has no headache or visual changes.  He is here today for evaluation before starting cycle #3 of his treatment.  MEDICAL HISTORY: Past Medical History:  Diagnosis Date  . Brain mass   . Bursitis of right hip   . Chest pain 08/12/2018  . Chronic cough   . Dependence on nicotine from cigarettes   . Diabetes mellitus without complication ([place])   . Essential hypertension 07/28/2018   . nscl ca dx'd 12/2019  . Seizures ([place])   . Viral illness 03/23/2019    ALLERGIES:  is allergic to penicillins.  MEDICATIONS:  Current Outpatient Medications  Medication Sig Dispense Refill  . azithromycin (ZITHROMAX Z-PAK) 250 MG tablet 2 tablets day 1 and then 1 tablet days 2 through 5 6 each 0  . levETIRAcetam (KEPPRA) 750 MG tablet Take 1 tablet (750 mg total) by mouth 2 (two) times daily. 180 tablet 4  . losartan (COZAAR) 25 MG tablet Take 1 tablet (25 mg total) by mouth daily. 30 tablet 2  . mirtazapine (REMERON) 15 MG tablet Take 1 tablet (15 mg total) by mouth at bedtime. (Patient not taking: Reported on 05/19/2020) 30 tablet 2  . omeprazole (PRILOSEC) 20 MG capsule Take 1 capsule (20 mg total) by mouth daily. (Patient not taking: Reported on 05/19/2020) 30 capsule 2   No current facility-administered medications for this visit.    SURGICAL HISTORY:  Past Surgical History:  Procedure Laterality Date  . APPLICATION OF CRANIAL NAVIGATION N/A 01/07/2020   Procedure: APPLICATION OF CRANIAL NAVIGATION;  Surgeon: Ashok Pall, MD;  Location: [facility];  Service: Neurosurgery;  Laterality: N/A;  . CRANIOTOMY Right 01/07/2020   Procedure: RIGHT CRANIOTOMY FOR TUMOR RESECTION;  Surgeon: Ashok Pall, MD;  Location: [facility];  Service: Neurosurgery;  Laterality: Right;  rm 21  . ENDOBRONCHIAL ULTRASOUND N/A 12/21/2019   Procedure: ENDOBRONCHIAL ULTRASOUND;  Surgeon: Laurin Coder, MD;  Location: [facility];  Service: Pulmonary;  Laterality: N/A;  . FINE NEEDLE ASPIRATION  12/21/2019   Procedure: FINE NEEDLE ASPIRATION (FNA) LINEAR;  Surgeon: Laurin Coder, MD;  Location: [facility];  Service: Pulmonary;;  . IR IMAGING GUIDED PORT INSERTION  03/03/2020  . NO PAST SURGERIES    . VIDEO BRONCHOSCOPY N/A 12/21/2019   Procedure: VIDEO BRONCHOSCOPY WITHOUT FLUORO;  Surgeon: Laurin Coder, MD;  Location: [facility];  Service: Pulmonary;  Laterality: N/A;    REVIEW OF SYSTEMS:  A  comprehensive review of systems was negative except for: Respiratory: positive for cough Musculoskeletal: positive for arthralgias   PHYSICAL EXAMINATION: General appearance: alert, cooperative, fatigued and no distress Head: Normocephalic, without obvious abnormality, atraumatic Neck: no adenopathy, no JVD, supple, symmetrical, trachea midline and thyroid not enlarged, symmetric, no tenderness/mass/nodules Lymph nodes: Cervical, supraclavicular, and axillary nodes normal. Resp: clear to auscultation bilaterally Back: symmetric, no curvature. ROM normal. No CVA tenderness. Cardio: regular rate and rhythm, S1, S2 normal, no murmur, click, rub or gallop GI: soft, non-tender; bowel sounds normal; no masses,  no organomegaly Extremities: extremities normal, atraumatic, no cyanosis or edema  ECOG PERFORMANCE STATUS: 1 - Symptomatic but completely ambulatory  Blood pressure 131/86, pulse 94, temperature (!) 97.3 F (36.3 C), temperature source Tympanic, resp. rate 18, height '6\' 1"'  (1.854 m), weight 185 lb 8 oz (84.1 kg), SpO2 100 %.  LABORATORY DATA: Lab Results  Component Value Date   WBC 7.5 06/06/2020   HGB 11.9 (L) 06/06/2020   HCT 35.7 (L) 06/06/2020   MCV 90.6 06/06/2020   PLT 387 06/06/2020      Chemistry      Component Value Date/Time   NA 139 05/16/2020 1111   NA 140 11/23/2019 1516   K 3.6 05/16/2020 1111   CL 108 05/16/2020 1111   CO2 25 05/16/2020 1111   BUN 10 05/16/2020 1111   BUN 14 11/23/2019 1516   CREATININE 0.80 05/16/2020 1111      Component Value Date/Time   CALCIUM 9.3 05/16/2020 1111   ALKPHOS 78 05/16/2020 1111   AST 21 05/16/2020 1111   ALT 21 05/16/2020 1111   BILITOT 0.8 05/16/2020 1111       RADIOGRAPHIC STUDIES: No results found.  ASSESSMENT AND PLAN: This is a very pleasant 65 years old African-American male recently diagnosed with a stage IV (TX, N2, M1c) non-small cell lung cancer, adenocarcinoma presented with solitary brain metastasis  in addition to right hilar and mediastinal lymphadenopathy diagnosed in August 2021.  The patient is status post right craniotomy with resection of the solitary brain metastasis with SRS. His PET scan showed no evidence of metastatic disease outside the chest. The patient was treated with a course of concurrent chemoradiation with weekly carboplatin and paclitaxel status post 5 cycles.  He tolerated his treatment well except for dysphagia and odynophagia. He had repeat CT scan of the chest as well as MRI of the brain performed recently and is here for evaluation and discussion of his imaging studies and treatment options. He has a scan of the chest showed improvement of his disease in the chest. The MRI of the brain showed 3 new brain metastasis measuring up to 3 mm.  He is scheduled to see Dr. Lisbeth Renshaw for evaluation and recommendation regarding these findings. I had a lengthy discussion with the patient today about his systemic treatment options.  His PD-L1 expression is 70%. The patient started treatment with immunotherapy with Cemiplimab 350 mg IV every 3 weeks status post 2 cycles.  He tolerated the last 2 cycles of his treatment  fairly well except for arthralgia. I recommended for the patient to proceed with cycle #3 today as planned. I will arrange for him to have repeat CT scan of the chest in 3 weeks for evaluation before the next cycle of his treatment. The patient was advised to call immediately if he has any concerning symptoms in the interval. The patient voices understanding of current disease status and treatment options and is in agreement with the current care plan.  All questions were answered. The patient knows to call the clinic with any problems, questions or concerns. We can certainly see the patient much sooner if necessary.   Disclaimer: This note was dictated with voice recognition software. Similar sounding words can inadvertently be transcribed and may not be corrected upon  review.

## 2020-06-08 ENCOUNTER — Telehealth: Payer: Self-pay | Admitting: Internal Medicine

## 2020-06-08 NOTE — Progress Notes (Signed)
  Radiation Oncology         [id number]) [id number] ________________________________  Name: Jared Shea. MRN: [id number]  Date: 04/22/2020  DOB: Apr 25, 1956  End of Treatment Note  Diagnosis:   Brain metastasis     Indication for treatment:  palliative       Radiation treatment dates:   04/22/2020  Site/dose:   20 Gy/ 1 fraction ExacTrac, 6 vmat beams Maxdose=120.9% PTV2 Lt Cerebellum 12mm PTV3 Rt Occipital 4mm PTV4 Lt Temporal 52mm  Narrative: The patient tolerated radiation treatment well.   There were no signs of acute toxicity after treatment.  Plan: The patient has completed radiation treatment. The patient will return to radiation oncology clinic for routine followup in one month. I advised the patient to call or return sooner if they have any questions or concerns related to their recovery or treatment. ________________________________  ------------------------------------------------  Jodelle Gross, MD, PhD

## 2020-06-08 NOTE — Telephone Encounter (Signed)
Scheduled appointments per 1/24 los. Will have updated calendar printed for patient at next visit.

## 2020-06-09 ENCOUNTER — Telehealth: Payer: Self-pay

## 2020-06-09 ENCOUNTER — Other Ambulatory Visit: Payer: Self-pay | Admitting: Medical

## 2020-06-09 NOTE — Telephone Encounter (Signed)
Pt called stating he is having headaches and wants to know what he can take to help?

## 2020-06-09 NOTE — Telephone Encounter (Signed)
Jared Shea,  This kind of question should be addressed within a formal encounter.  I'm ok with either in-person or phone visit, it's up to him  Ventura Sellers, MD

## 2020-06-10 NOTE — Progress Notes (Signed)
  Radiation Oncology         [id number]) [phone] ________________________________  Name: Jared Shea. MRN: [id number]  Date: 03/14/2020  DOB: August 24, 1955  End of Treatment Note  Diagnosis:  Lung cancer     Indication for treatment::  curative       Radiation treatment dates:   01/25/20 - 03/14/20  Site/dose:   The patient was treated to the disease within the left lung initially to a dose of 60 Gy using a 4 field, 3-D conformal technique. The patient then received a cone down boost treatment for an additional 6 Gy. This yielded a final total dose of 66 Gy.   Narrative: The patient tolerated radiation treatment relatively well.      Plan: The patient has completed radiation treatment. The patient will return to radiation oncology clinic for routine followup in one month. I advised the patient to call or return sooner if they have any questions or concerns related to their recovery or treatment. ________________________________  Jodelle Gross, M.D., Ph.D.

## 2020-06-13 NOTE — Progress Notes (Incomplete)
Patient Care Team: Wenda Low, MD as PCP - General (Internal Medicine) Rockwell Germany, RN as Oncology Nurse Navigator Mauro Kaufmann, RN as Oncology Nurse Navigator Jared Keens, MD as Consulting Physician (General Surgery) Nicholas Lose, MD as Consulting Physician (Hematology and Oncology) Gery Pray, MD as Consulting Physician (Radiation Oncology)  DIAGNOSIS:    ICD-10-CM   1. Malignant neoplasm of upper-outer quadrant of left breast in male, estrogen receptor positive ([name])  C50.412    Z17.0     SUMMARY OF ONCOLOGIC HISTORY: Oncology History  Malignant neoplasm of upper-outer quadrant of left breast in male, estrogen receptor positive ([name])  03/24/2020 Initial Diagnosis   Mammogram in 01/2018 showed probably benign bilateral breast masses that were stable on her last mammogram on 01/28/19. Mammogram on 03/02/20 showed the stable bilateral masses, a new 0.9cm mass at the 1 o'clock position in the left breast, and one abnormal left axillary lymph node with 0.6cm cortical thickening. Biopsy showed invasive and in situ ductal carcinoma in the breast and axilla, grade 2, HER-2 equivocal by IHC (2+), ER+ 90%, PR+ 60%, Ki67 25%.    04/06/2020 Genetic Testing   Negative genetic testing: no pathogenic variants detected in [facility]-Hereditary Cancers Panel.  Variants of uncertain significance detected in APC c.6918T>A (p.Asp2306Glu) and POLE  c.2612G>C (p.Ser871Thr).  The report date is November 24. 2021.   The [facility] Hereditary Cancers Panel offered by [facility] includes sequencing and/or deletion duplication testing of the following 48 genes: APC, ATM, AXIN2, BARD1, BMPR1A, BRCA1, BRCA2, BRIP1, CDH1, CDK4, CDKN2A (p14ARF), CDKN2A (p16INK4a), CHEK2, CTNNA1, DICER1, EPCAM (Deletion/duplication testing only), GREM1 (promoter region deletion/duplication testing only), KIT, MEN1, MLH1, MSH2, MSH3, MSH6, MUTYH, NBN, NF1, NHTL1, PALB2, PDGFRA, PMS2, POLD1, POLE, PTEN, RAD50,  RAD51C, RAD51D, RNF43, SDHB, SDHC, SDHD, SMAD4, SMARCA4. STK11, TP53, TSC1, TSC2, and VHL.  The following genes were evaluated for sequence changes only: SDHA and HOXB13 c.251G>A variant only.   06/08/2020 Surgery   Bilateral mastectomies with reconstruction Ninfa Linden):  Right breast: no malignancy  Left breast: invasive and in situ ductal carcinoma, 1.5cm, grade 2, 3/19 left axillary lymph nodes positive for metastatic carcinoma.      CHIEF COMPLIANT: Follow-up s/p bilateral mastectomies  INTERVAL HISTORY: Jared Shea is a 65 y.o. with above-mentioned history of left breast cancer. She underwent bilateral mastectomies with reconstruction on 06/08/20 with Dr. Ninfa Linden for which pathology showed in the right breast, no malignancy, and in the left breast, invasive and in situ ductal carcinoma, 1.5cm, grade 2, 3/19 left axillary lymph nodes positive for metastatic carcinoma. She presents to the clinic today for follow-up to review the pathology report and discuss further treatment.   ALLERGIES:  has No Known Allergies.  MEDICATIONS:  Current Outpatient Medications  Medication Sig Dispense Refill  . acetaminophen (TYLENOL) 500 MG tablet Take 1 tablet (500 mg total) by mouth every 6 (six) hours as needed. For use AFTER surgery 30 tablet 0  . busPIRone (BUSPAR) 30 MG tablet Take 1 tablet (30 mg total) by mouth 2 (two) times daily. 60 tablet 5  . cetirizine (ZYRTEC) 10 MG tablet Take 10 mg by mouth daily.    . DULoxetine (CYMBALTA) 60 MG capsule Take 2 capsules (120 mg total) by mouth daily. 60 capsule 5  . EPINEPHrine (EPIPEN 2-PAK) 0.3 mg/0.3 mL IJ SOAJ injection Inject 0.3 mLs (0.3 mg total) into the muscle once as needed for up to 1 dose (for severe allergic reaction). CAll 911 immediately if you have to use this medicine 1  each 1  . gabapentin (NEURONTIN) 100 MG capsule Take 1 capsule (100 mg total) by mouth 3 (three) times daily as needed. 90 capsule 1  . ibuprofen (ADVIL) 600 MG tablet Take 1  tablet (600 mg total) by mouth every 6 (six) hours as needed for mild pain or moderate pain. For use AFTER surgery 30 tablet 0  . melatonin 5 MG TABS Take 5 mg by mouth at bedtime as needed.    . montelukast (SINGULAIR) 10 MG tablet Take 1 tablet by mouth daily.  5  . Multiple Vitamins-Minerals (MULTIVITAMIN PO) Take 2 tablets by mouth daily. Skin/hair/nail supplement.    . ondansetron (ZOFRAN-ODT) 4 MG disintegrating tablet Take 1 tablet (4 mg total) by mouth every 8 (eight) hours as needed for nausea or vomiting. 20 tablet 0  . PROAIR HFA 108 (90 Base) MCG/ACT inhaler Inhale 2 puffs into the lungs as needed.  0  . SYMBICORT 160-4.5 MCG/ACT inhaler Inhale 2 puffs into the lungs 2 (two) times daily.  5  . tamoxifen (NOLVADEX) 10 MG tablet Take 1 tablet (10 mg total) by mouth daily. 30 tablet 0   No current facility-administered medications for this visit.    PHYSICAL EXAMINATION: ECOG PERFORMANCE STATUS: {CHL ONC ECOG PS:[phone]}  There were no vitals filed for this visit. There were no vitals filed for this visit.   LABORATORY DATA:  I have reviewed the data as listed CMP Latest Ref Rng & Units 03/30/2020  Glucose 70 - 99 mg/dL 143(H)  BUN 6 - 20 mg/dL 8  Creatinine 0.44 - 1.00 mg/dL 0.82  Sodium 135 - 145 mmol/L 139  Potassium 3.5 - 5.1 mmol/L 3.7  Chloride 98 - 111 mmol/L 106  CO2 22 - 32 mmol/L 22  Calcium 8.9 - 10.3 mg/dL 8.7(L)  Total Protein 6.5 - 8.1 g/dL 7.0  Total Bilirubin 0.3 - 1.2 mg/dL 0.3  Alkaline Phos 38 - 126 U/L 79  AST 15 - 41 U/L 13(L)  ALT 0 - 44 U/L 13    Lab Results  Component Value Date   WBC 9.9 03/30/2020   HGB 11.7 (L) 03/30/2020   HCT 36.3 03/30/2020   MCV 86.4 03/30/2020   PLT 426 (H) 03/30/2020   NEUTROABS 6.1 03/30/2020    ASSESSMENT & PLAN:  No problem-specific Assessment & Plan notes found for this encounter.    No orders of the defined types were placed in this encounter.  The patient has a good understanding of the overall  plan. she agrees with it. she will call with any problems that may develop before the next visit here.  Total time spent: *** mins including face to face time and time spent for planning, charting and coordination of care  Nicholas Lose, MD 06/13/2020  I, Cloyde Reams Dorshimer, am acting as scribe for Dr. Nicholas Lose.  {insert scribe attestation}

## 2020-06-14 NOTE — Telephone Encounter (Signed)
[name],  For your approval or refusal.  [name]

## 2020-06-15 ENCOUNTER — Other Ambulatory Visit: Payer: Self-pay

## 2020-06-15 ENCOUNTER — Encounter: Payer: Self-pay | Admitting: Internal Medicine

## 2020-06-15 ENCOUNTER — Ambulatory Visit (INDEPENDENT_AMBULATORY_CARE_PROVIDER_SITE_OTHER): Payer: Medicare HMO | Admitting: Internal Medicine

## 2020-06-15 VITALS — BP 121/85 | HR 98 | Ht 73.0 in | Wt 185.7 lb

## 2020-06-15 DIAGNOSIS — M2550 Pain in unspecified joint: Secondary | ICD-10-CM | POA: Diagnosis not present

## 2020-06-15 HISTORY — DX: Pain in unspecified joint: M25.50

## 2020-06-15 NOTE — Progress Notes (Signed)
Acute Office Visit  Subjective:    Patient ID: Jared Shea., male    DOB: 10-16-1955, 65 y.o.   MRN: [id number]  Chief Complaint  Patient presents with  . Pain    ALL OVER JOINT PAIN   # 7   CC: multiple joint pain HPI Patient is in today for worsening of joints pain. Please refer to problem based charting for further details and assessment and plan of current problem and chronic medical conditions.  PMHx: DM2, non small cell lung carcinoma stage 4 w brain met (diagnosed in August 2021. status post right craniotomy with resection of the solitary brain metastasis. Concurrent chemoradiation with weekly carboplatin for AUC of 2 and paclitaxel 45 mg/M2. S/p 6 cycle. Last PET scan showed no evidence of metastatic disease outside the chest), HTN/Hypotension, focal seziure, former tobacco use  Medications: Keppra, losartan  Vaccinated against COVID x 2:    Past Medical History:  Diagnosis Date  . Brain mass   . Bursitis of right hip   . Chest pain 08/12/2018  . Chronic cough   . Dependence on nicotine from cigarettes   . Diabetes mellitus without complication ([place])   . Essential hypertension 07/28/2018  . nscl ca dx'd 12/2019  . Seizures ([place])   . Viral illness 03/23/2019    Past Surgical History:  Procedure Laterality Date  . APPLICATION OF CRANIAL NAVIGATION N/A 01/07/2020   Procedure: APPLICATION OF CRANIAL NAVIGATION;  Surgeon: Ashok Pall, MD;  Location: [place];  Service: Neurosurgery;  Laterality: N/A;  . CRANIOTOMY Right 01/07/2020   Procedure: RIGHT CRANIOTOMY FOR TUMOR RESECTION;  Surgeon: Ashok Pall, MD;  Location: [place];  Service: Neurosurgery;  Laterality: Right;  rm 21  . ENDOBRONCHIAL ULTRASOUND N/A 12/21/2019   Procedure: ENDOBRONCHIAL ULTRASOUND;  Surgeon: Laurin Coder, MD;  [facility];  Service: Pulmonary;  Laterality: N/A;  . FINE NEEDLE ASPIRATION  12/21/2019   Procedure: FINE NEEDLE ASPIRATION (FNA) LINEAR;  Surgeon: Laurin Coder, MD;   [facility];  Service: Pulmonary;;  . IR IMAGING GUIDED PORT INSERTION  03/03/2020  . NO PAST SURGERIES    . VIDEO BRONCHOSCOPY N/A 12/21/2019   Procedure: VIDEO BRONCHOSCOPY WITHOUT FLUORO;  Surgeon: Laurin Coder, MD;  [facility];  Service: Pulmonary;  Laterality: N/A;    Family History  Problem Relation Age of Onset  . Heart disease Mother        CABG  . Kidney disease Sister   . Diabetes Brother   . Kidney disease Brother        KIDNEY TRANSPLANT  . Hypertension Sister   . Healthy Daughter   . Healthy Daughter   . Healthy Son     Social History   Socioeconomic History  . Marital status: Divorced    Spouse name: Not on file  . Number of children: Not on file  . Years of education: Not on file  . Highest education level: Not on file  Occupational History  . Not on file  Tobacco Use  . Smoking status: Former Smoker    Packs/day: 1.00    Years: 48.00    Pack years: 48.00    Types: Cigarettes    Start date: 08/07/1970    Quit date: 12/15/2019    Years since quitting: 0.5  . Smokeless tobacco: Never Used  Vaping Use  . Vaping Use: Never used  Substance and Sexual Activity  . Alcohol use: Not Currently  . Drug use: Never  .  Sexual activity: Not on file    Comment: YES  Other Topics Concern  . Not on file  Social History Narrative  . Not on file   Social Determinants of Health   Financial Resource Strain: High Risk  . Difficulty of Paying Living Expenses: Hard  Food Insecurity: No Food Insecurity  . Worried About Charity fundraiser in the Last Year: Never true  . Ran Out of Food in the Last Year: Never true  Transportation Needs: Unmet Transportation Needs  . Lack of Transportation (Medical): Yes  . Lack of Transportation (Non-Medical): Yes  Physical Activity: Not on file  Stress: Not on file  Social Connections: Not on file  Intimate Partner Violence: Not on file    Outpatient Medications Prior to Visit  Medication Sig  Dispense Refill  . azithromycin (ZITHROMAX) 250 MG tablet TAKE 2 TABLETS BY MOUTH FOR 1 DAY THEN TAKE 1 TABLET BY MOUTH DAILY FOR 4 DAYS 6 tablet 0  . levETIRAcetam (KEPPRA) 750 MG tablet Take 1 tablet (750 mg total) by mouth 2 (two) times daily. 180 tablet 4  . losartan (COZAAR) 25 MG tablet Take 1 tablet (25 mg total) by mouth daily. 30 tablet 2  . mirtazapine (REMERON) 15 MG tablet Take 1 tablet (15 mg total) by mouth at bedtime. (Patient not taking: Reported on 05/19/2020) 30 tablet 2  . omeprazole (PRILOSEC) 20 MG capsule Take 1 capsule (20 mg total) by mouth daily. (Patient not taking: Reported on 05/19/2020) 30 capsule 2   No facility-administered medications prior to visit.    Allergies  Allergen Reactions  . Penicillins Rash    Did it involve swelling of the face/tongue/throat, SOB, or low BP? N Did it involve sudden or severe rash/hives, skin peeling, or any reaction on the inside of your mouth or nose? Y Did you need to seek medical attention at a hospital or doctor's office? Y When did it last happen?childhood If all above answers are "NO", may proceed with cephalosporin use.     Review of Systems  Constitutional: Negative for fever.  Cardiovascular: Negative for leg swelling.  Gastrointestinal: Negative for diarrhea, nausea and vomiting.  Musculoskeletal: Positive for arthralgias. Negative for joint swelling and myalgias.  Skin: Negative for rash.       Objective:    Physical Exam Constitutional:      General: He is not in acute distress.    Appearance: He is not ill-appearing.  Cardiovascular:     Rate and Rhythm: Normal rate and regular rhythm.     Heart sounds: No murmur heard.   Pulmonary:     Effort: Pulmonary effort is normal.     Breath sounds: Normal breath sounds. No wheezing or rales.  Abdominal:     Palpations: Abdomen is soft.     Tenderness: There is no abdominal tenderness.  Musculoskeletal:        General: Tenderness present. No swelling.      Right lower leg: No edema.     Left lower leg: No edema.     Comments: Hip pain when he tries to get up.  Shoulder ROM is full but painful   Skin:    General: Skin is warm and dry.  Neurological:     Mental Status: He is alert. Mental status is at baseline.  Psychiatric:        Mood and Affect: Mood normal.        Behavior: Behavior normal.     BP 121/85 (BP Location:  Left Arm, Patient Position: Sitting, Cuff Size: Normal)   Pulse 98   Ht '6\' 1"'  (1.854 m)   Wt 185 lb 11.2 oz (84.2 kg)   SpO2 98%   BMI 24.50 kg/m  Wt Readings from Last 3 Encounters:  06/15/20 185 lb 11.2 oz (84.2 kg)  06/06/20 185 lb 8 oz (84.1 kg)  05/19/20 184 lb 12.8 oz (83.8 kg)    Health Maintenance Due  Topic Date Due  . Hepatitis C Screening  Never done  . OPHTHALMOLOGY EXAM  Never done  . COVID-19 Vaccine (3 - Pfizer risk 4-dose series) 10/08/2019  . INFLUENZA VACCINE  Never done    There are no preventive care reminders to display for this patient.   Lab Results  Component Value Date   TSH 1.194 06/06/2020   Lab Results  Component Value Date   WBC 7.5 06/06/2020   HGB 11.9 (L) 06/06/2020   HCT 35.7 (L) 06/06/2020   MCV 90.6 06/06/2020   PLT 387 06/06/2020   Lab Results  Component Value Date   NA 138 06/06/2020   K 4.2 06/06/2020   CO2 23 06/06/2020   GLUCOSE 100 (H) 06/06/2020   BUN 16 06/06/2020   CREATININE 0.79 06/06/2020   BILITOT 0.5 06/06/2020   ALKPHOS 110 06/06/2020   AST 45 (H) 06/06/2020   ALT 48 (H) 06/06/2020   PROT 7.9 06/06/2020   ALBUMIN 3.8 06/06/2020   CALCIUM 9.2 06/06/2020   ANIONGAP 7 06/06/2020   No results found for: CHOL No results found for: HDL No results found for: LDLCALC No results found for: TRIG No results found for: CHOLHDL Lab Results  Component Value Date   HGBA1C 4.4 04/11/2020       Assessment & Plan:   Problem List Items Addressed This Visit      Other   Polyarthralgia - Primary    Polyarthralgia 2/2  chemotherapy: Pain with activity and movement of shoulders, elbows, hip and knees. His symptoms started couple of months ago after he was started on chemotherapy but then got better and again started a month ago. No swelling or redness on his joint. No sensitivity to sun. No fever or chills.  He takes Ibuprofen once or twice a day with some relief.  On exam, no erythema, swelling or effusion of joints. Some non localized pain around shoulder, elbow and knees. His ROM of all joints are full but bring him some pain. To stand up from the chair, he needs to get support from his hands due to hip pain. Sensory is intact. Motor strength is 4+/5 (due to pain). Bilaterally in upper and lower extremities muscle groups.  His polyarthralgia seems to be 2/2 chemotherapy for lung cancer. And no indication to check inflammatory markers or for autoimmune cause of polyarthritis at this point. Recent CBC was unremarkable and CMP nl except mild LFT elevation and TSH was normal. His symptoms are chronic and since he was started on chemotherapy and I do not think we should check him for viral disease, covid,.. that could cause body-joint pain.  Recommended him to discuss his symptoms with his oncologist. (Per his last note, patient tolerated chemo except arthralgia and plan was continue chemo.)  -IIbuprofen 400-600 mg PRN, 2-3 times a day. Instructed to take Omeprazol with that if any heartburn. -F/u with oncology for further recommendations         No orders of the defined types were placed in this encounter.     Dewayne Hatch, MD

## 2020-06-15 NOTE — Patient Instructions (Addendum)
Thank you for allowing [place] to provide your care today. Today we discussed your join pain. As we discussed, your joint pain seems to be due to chemotherapy. I recommend you to discuss it with your oncologist. You can take Ibuprofen 400-600 mg as needed up to 2-3 times a day meanwhile. Make sure to take that with food. If developed Heartburn, take omeprazole 40 mg once a day (you can get it over the counter) Today we made no changes to rest of your medications.    Please follow up with your PCP in 3 months or earlier as needed. Asalways, if having severe symptoms, please seek medical attention at emergency room. Should you have any questions or concerns please call the internal medicine clinic at [phone].    Thank you!

## 2020-06-15 NOTE — Assessment & Plan Note (Addendum)
Polyarthralgia 2/2 chemotherapy: Pain with activity and movement of shoulders, elbows, hip and knees. His symptoms started couple of months ago after he was started on chemotherapy but then got better and again started a month ago. No swelling or redness on his joint. No sensitivity to sun. No fever or chills.  He takes Ibuprofen once or twice a day with some relief.  On exam, no erythema, swelling or effusion of joints. Some non localized pain around shoulder, elbow and knees. His ROM of all joints are full but bring him some pain. To stand up from the chair, he needs to get support from his hands due to hip pain. Sensory is intact. Motor strength is 4+/5 (due to pain). Bilaterally in upper and lower extremities muscle groups.  His polyarthralgia seems to be 2/2 chemotherapy for lung cancer. And no indication to check inflammatory markers or for autoimmune cause of polyarthritis at this point. Recent CBC was unremarkable and CMP nl except mild LFT elevation and TSH was normal. His symptoms are chronic and since he was started on chemotherapy and I do not think we should check him for viral disease, covid,.. that could cause body-joint pain.  Recommended him to discuss his symptoms with his oncologist. (Per his last note, patient tolerated chemo except arthralgia and plan was continue chemo.)  -IIbuprofen 400-600 mg PRN, 2-3 times a day. Instructed to take Omeprazol with that if any heartburn. -F/u with oncology for further recommendations

## 2020-06-17 ENCOUNTER — Other Ambulatory Visit: Payer: Self-pay | Admitting: Radiation Therapy

## 2020-06-20 ENCOUNTER — Inpatient Hospital Stay: Payer: Medicare HMO | Attending: Internal Medicine | Admitting: Internal Medicine

## 2020-06-20 ENCOUNTER — Other Ambulatory Visit: Payer: Self-pay

## 2020-06-20 ENCOUNTER — Telehealth: Payer: Self-pay

## 2020-06-20 VITALS — BP 126/78 | HR 79 | Temp 97.8°F | Resp 16 | Ht 73.0 in | Wt 188.7 lb

## 2020-06-20 DIAGNOSIS — Z8669 Personal history of other diseases of the nervous system and sense organs: Secondary | ICD-10-CM | POA: Diagnosis not present

## 2020-06-20 DIAGNOSIS — R569 Unspecified convulsions: Secondary | ICD-10-CM | POA: Diagnosis not present

## 2020-06-20 DIAGNOSIS — E119 Type 2 diabetes mellitus without complications: Secondary | ICD-10-CM | POA: Insufficient documentation

## 2020-06-20 DIAGNOSIS — C3490 Malignant neoplasm of unspecified part of unspecified bronchus or lung: Secondary | ICD-10-CM | POA: Insufficient documentation

## 2020-06-20 DIAGNOSIS — G40909 Epilepsy, unspecified, not intractable, without status epilepticus: Secondary | ICD-10-CM | POA: Insufficient documentation

## 2020-06-20 DIAGNOSIS — Z87891 Personal history of nicotine dependence: Secondary | ICD-10-CM | POA: Diagnosis not present

## 2020-06-20 DIAGNOSIS — I10 Essential (primary) hypertension: Secondary | ICD-10-CM | POA: Insufficient documentation

## 2020-06-20 DIAGNOSIS — C7931 Secondary malignant neoplasm of brain: Secondary | ICD-10-CM

## 2020-06-20 MED ORDER — DEXAMETHASONE 4 MG PO TABS: 4.0000 mg | ORAL_TABLET | Freq: Every day | ORAL | 0 refills | Status: DC

## 2020-06-20 NOTE — Telephone Encounter (Signed)
Pt called in to clarify/confirm that he should continue his regular medications (Keppra and Cozaar)  along with the decadron that he started today. Confirmed via MD note from today. Pt verified understanding.

## 2020-06-20 NOTE — Progress Notes (Signed)
[facility] at [place], [place] [address] [phone]   New Patient Evaluation  Date of Service: 06/20/20 Patient Name: Jared Shea. Patient MRN: [id number] Patient DOB: [birth year] Provider: Ventura Sellers, MD  Identifying Statement:  Wendelin Bradt. is a 65 y.o. male with Brain metastases [facility]) [C79.31] who presents for initial consultation and evaluation regarding cancer associated neurologic deficits.    Referring Provider: Jean Rosenthal, MD [address] [place],  [place] [address]  Primary Cancer:  Oncologic History: Oncology History  Non-small cell carcinoma of lung, stage 4 w/ mets to brain ([facility])  12/31/2019 Initial Diagnosis   Non-small cell carcinoma of lung, stage 4 w/ mets to brain ([facility])   06/06/2020 Cancer Staging   Staging form: Lung, AJCC 8th Edition - Clinical: Stage IVB (cT0, cN2, cM1c) - Signed by Curt Bears, MD on 06/06/2020   Adenocarcinoma, metastatic ([facility])  01/07/2020 Initial Diagnosis   Adenocarcinoma, metastatic ([place])   02/01/2020 - 03/07/2020 Chemotherapy   The patient had dexamethasone (DECADRON) 4 MG tablet, 8 mg, Oral, Daily, 1 of 1 cycle, Start date: --, End date: -- palonosetron (ALOXI) injection 0.25 mg, 0.25 mg, Intravenous,  Once, 5 of 7 cycles Administration: 0.25 mg (02/01/2020), 0.25 mg (02/08/2020), 0.25 mg (03/07/2020), 0.25 mg (02/15/2020), 0.25 mg (02/22/2020) CARBOplatin (PARAPLATIN) 220 mg in sodium chloride 0.9 % 250 mL chemo infusion, 220 mg (100 % of original dose 216.8 mg), Intravenous,  Once, 5 of 7 cycles Dose modification: 216.8 mg (original dose 216.8 mg, Cycle 1) Administration: 220 mg (02/01/2020), 220 mg (02/08/2020), 220 mg (03/07/2020), 220 mg (02/15/2020), 220 mg (02/22/2020) PACLitaxel (TAXOL) 90 mg in sodium chloride 0.9 % 250 mL chemo infusion (</= 80mg /m2), 45 mg/m2 = 90 mg, Intravenous,  Once, 5 of 7 cycles Administration: 90 mg (02/01/2020), 90 mg  (02/08/2020), 90 mg (03/07/2020), 90 mg (02/15/2020), 90 mg (02/22/2020)  for chemotherapy treatment.    04/29/2020 -  Chemotherapy    Patient is on Treatment Plan: MELANOMA CEMIPLIMAB Q21D       CNS Oncologic History 01/05/20: Pre-op SRS [facility]) 01/07/20: Craniotomy, R frontal resection ([facility]) 04/22/20: Salvage SRS to 3 targets   Interval History:  Mance Vallejo. presents today for new headache complaint.  Headaches are described as frontal and temporal, moderate pressure, lasting for minutes or hours.  No nausea, photophobia, not associated neurologic deficits.  He feels they are associated with body pain and joint pains he has experienced since beginning of chemotherapy.  Currently dosing Ibuprofen several times per day for the past month.  Doesn't report issues with sleep, but s/o does describe snoring and daytime fatigue in him.  Currently undergoing cemiplimab immunotherapy with Dr. Julien Nordmann since 04/29/20.    H+P (04/18/20) Patient presents following recent brain MRI.  He had completed pre-op SRS and underwent craniotomy for R frontal metastasis, after initial presenting with focal seizures (facial twitching).  He denies focal neurologic complaints today, he is functional and independent.  He is undergoing chemotherapy with Dr. Julien Nordmann (completed [place] and taxol) without complication.  His gait is unassisted.  Denies any seizures following his initial presentation, compliant with Keppra.      Medications: Current Outpatient Medications on File Prior to Visit  Medication Sig Dispense Refill  . azithromycin (ZITHROMAX) 250 MG tablet TAKE 2 TABLETS BY MOUTH FOR 1 DAY THEN TAKE 1 TABLET BY MOUTH DAILY FOR 4 DAYS 6 tablet 0  . levETIRAcetam (KEPPRA) 750 MG  tablet Take 1 tablet (750 mg total) by mouth 2 (two) times daily. 180 tablet 4  . losartan (COZAAR) 25 MG tablet Take 1 tablet (25 mg total) by mouth daily. 30 tablet 2  . mirtazapine (REMERON) 15 MG tablet Take 1 tablet (15 mg total) by  mouth at bedtime. (Patient not taking: Reported on 05/19/2020) 30 tablet 2  . omeprazole (PRILOSEC) 20 MG capsule Take 1 capsule (20 mg total) by mouth daily. (Patient not taking: Reported on 05/19/2020) 30 capsule 2   No current facility-administered medications on file prior to visit.    Allergies:  Allergies  Allergen Reactions  . Penicillins Rash    Did it involve swelling of the face/tongue/throat, SOB, or low BP? N Did it involve sudden or severe rash/hives, skin peeling, or any reaction on the inside of your mouth or nose? Y Did you need to seek medical attention at a hospital or doctor's office? Y When did it last happen?childhood If all above answers are "NO", may proceed with cephalosporin use.    Past Medical History:  Past Medical History:  Diagnosis Date  . Brain mass   . Bursitis of right hip   . Chest pain 08/12/2018  . Chronic cough   . Dependence on nicotine from cigarettes   . Diabetes mellitus without complication ([facility])   . Essential hypertension 07/28/2018  . nscl ca dx'd 12/2019  . Seizures ([place])   . Viral illness 03/23/2019   Past Surgical History:  Past Surgical History:  Procedure Laterality Date  . APPLICATION OF CRANIAL NAVIGATION N/A 01/07/2020   Procedure: APPLICATION OF CRANIAL NAVIGATION;  Surgeon: Ashok Pall, MD;  Location: [facility];  Service: Neurosurgery;  Laterality: N/A;  . CRANIOTOMY Right 01/07/2020   Procedure: RIGHT CRANIOTOMY FOR TUMOR RESECTION;  Surgeon: Ashok Pall, MD;  Location: [facility];  Service: Neurosurgery;  Laterality: Right;  rm 21  . ENDOBRONCHIAL ULTRASOUND N/A 12/21/2019   Procedure: ENDOBRONCHIAL ULTRASOUND;  Surgeon: Laurin Coder, MD;  [facility];  Service: Pulmonary;  Laterality: N/A;  . FINE NEEDLE ASPIRATION  12/21/2019   Procedure: FINE NEEDLE ASPIRATION (FNA) LINEAR;  Surgeon: Laurin Coder, MD;  [facility];  Service: Pulmonary;;  . IR IMAGING GUIDED PORT INSERTION  03/03/2020  . NO PAST  SURGERIES    . VIDEO BRONCHOSCOPY N/A 12/21/2019   Procedure: VIDEO BRONCHOSCOPY WITHOUT FLUORO;  Surgeon: Laurin Coder, MD;  [facility];  Service: Pulmonary;  Laterality: N/A;   Social History:  Social History   Socioeconomic History  . Marital status: Divorced    Spouse name: Not on file  . Number of children: Not on file  . Years of education: Not on file  . Highest education level: Not on file  Occupational History  . Not on file  Tobacco Use  . Smoking status: Former Smoker    Packs/day: 1.00    Years: 48.00    Pack years: 48.00    Types: Cigarettes    Start date: 08/07/1970    Quit date: 12/15/2019    Years since quitting: 0.5  . Smokeless tobacco: Never Used  Vaping Use  . Vaping Use: Never used  Substance and Sexual Activity  . Alcohol use: Not Currently  . Drug use: Never  . Sexual activity: Not on file    Comment: YES  Other Topics Concern  . Not on file  Social History Narrative  . Not on file   Social Determinants of Health   Financial Resource  Strain: High Risk  . Difficulty of Paying Living Expenses: Hard  Food Insecurity: No Food Insecurity  . Worried About Charity fundraiser in the Last Year: Never true  . Ran Out of Food in the Last Year: Never true  Transportation Needs: Unmet Transportation Needs  . Lack of Transportation (Medical): Yes  . Lack of Transportation (Non-Medical): Yes  Physical Activity: Not on file  Stress: Not on file  Social Connections: Not on file  Intimate Partner Violence: Not on file   Family History:  Family History  Problem Relation Age of Onset  . Heart disease Mother        CABG  . Kidney disease Sister   . Diabetes Brother   . Kidney disease Brother        KIDNEY TRANSPLANT  . Hypertension Sister   . Healthy Daughter   . Healthy Daughter   . Healthy Son     Review of Systems: Constitutional: Doesn't report fevers, chills or abnormal weight loss Eyes: Doesn't report blurriness of  vision Ears, nose, mouth, throat, and face: Doesn't report sore throat Respiratory: Doesn't report cough, dyspnea or wheezes Cardiovascular: Doesn't report palpitation, chest discomfort  Gastrointestinal:  Doesn't report nausea, constipation, diarrhea GU: Doesn't report incontinence Skin: Doesn't report skin rashes Neurological: Per HPI Musculoskeletal: Doesn't report joint pain Behavioral/Psych: Doesn't report anxiety  Physical Exam: Vitals:   06/20/20 1242  BP: 126/78  Pulse: 79  Resp: 16  Temp: 97.8 F (36.6 C)  SpO2: 100%   KPS: 90. General: Alert, cooperative, pleasant, in no acute distress Head: Normal EENT: No conjunctival injection or scleral icterus.  Lungs: Resp effort normal Cardiac: Regular rate Abdomen: Non-distended abdomen Skin: No rashes cyanosis or petechiae. Extremities: No clubbing or edema  Neurologic Exam: Mental Status: Awake, alert, attentive to examiner. Oriented to self and environment. Language is fluent with intact comprehension.  Cranial Nerves: Visual acuity is grossly normal. Visual fields are full. Extra-ocular movements intact. No ptosis. Face is symmetric Motor: Tone and bulk are normal. Power is full in both arms and legs. Reflexes are symmetric, no pathologic reflexes present.  Sensory: Intact to light touch Gait: Normal.   Labs: I have reviewed the data as listed    Component Value Date/Time   NA 138 06/06/2020 1058   NA 140 11/23/2019 1516   K 4.2 06/06/2020 1058   CL 108 06/06/2020 1058   CO2 23 06/06/2020 1058   GLUCOSE 100 (H) 06/06/2020 1058   BUN 16 06/06/2020 1058   BUN 14 11/23/2019 1516   CREATININE 0.79 06/06/2020 1058   CALCIUM 9.2 06/06/2020 1058   PROT 7.9 06/06/2020 1058   ALBUMIN 3.8 06/06/2020 1058   AST 45 (H) 06/06/2020 1058   ALT 48 (H) 06/06/2020 1058   ALKPHOS 110 06/06/2020 1058   BILITOT 0.5 06/06/2020 1058   GFRNONAA >60 06/06/2020 1058   GFRAA >60 02/15/2020 1000   Lab Results  Component Value  Date   WBC 7.5 06/06/2020   NEUTROABS 4.8 06/06/2020   HGB 11.9 (L) 06/06/2020   HCT 35.7 (L) 06/06/2020   MCV 90.6 06/06/2020   PLT 387 06/06/2020     Assessment/Plan Brain metastases ([facility]) [C79.31]  [name]. presents today with syndrome consistent with chronic daily headache.  Etiology is either analgesia overuse, effect of chemo/immunotherapy or a combination.  There are no migrainous features or neurologic deficits.   He does have an upcoming brain MRI study next month, for 3 months s/p SRS follow  up.    We recommended short course of dexamethasone, 4mg  x5 days, to alleviate pain burden while he gently weans chronic analgesia.  We counseled him on pain medication overuse and headache hygeine.  We are ok with 800mg  Ibuprofen early in headache course, limited to ~4x per week.  We appreciate the opportunity to participate in the care of Montrail Mehrer.Marland Kitchen  He will return to clinic next month for evaluation.  All questions were answered. The patient knows to call the clinic with any problems, questions or concerns. No barriers to learning were detected.  The total time spent in the encounter was 30 minutes and more than 50% was on counseling and review of test results   Ventura Sellers, MD Medical Director of Neuro-Oncology [facility] at [place] 06/20/20 12:34 PM

## 2020-06-20 NOTE — Progress Notes (Signed)
Internal Medicine Clinic Attending  Case discussed with Dr. Masoudi  At the time of the visit.  We reviewed the resident's history and exam and pertinent patient test results.  I agree with the assessment, diagnosis, and plan of care documented in the resident's note.  

## 2020-06-23 ENCOUNTER — Telehealth: Payer: Self-pay

## 2020-06-23 DIAGNOSIS — G40209 Localization-related (focal) (partial) symptomatic epilepsy and epileptic syndromes with complex partial seizures, not intractable, without status epilepticus: Secondary | ICD-10-CM

## 2020-06-23 MED ORDER — LEVETIRACETAM 750 MG PO TABS: 750.0000 mg | ORAL_TABLET | Freq: Two times a day (BID) | ORAL | 4 refills | Status: DC

## 2020-06-23 NOTE — Telephone Encounter (Signed)
Pt left a message requesting a refill of Keppra.  Pt also requested a refill of Losartan. I called him back to advise he would need to contact his PCP regarding the Losartan. I also called pts daughter (at his request) to also advise her we can take care of the Keppra refill but he would need to contact his PCP for the Losartan. She expressed understanding of this information.

## 2020-06-24 ENCOUNTER — Ambulatory Visit (HOSPITAL_COMMUNITY): Admission: RE | Admit: 2020-06-24 | Payer: Medicare HMO | Source: Ambulatory Visit

## 2020-06-25 ENCOUNTER — Emergency Department (HOSPITAL_COMMUNITY): Payer: Medicare HMO

## 2020-06-25 ENCOUNTER — Other Ambulatory Visit: Payer: Self-pay

## 2020-06-25 ENCOUNTER — Encounter (HOSPITAL_COMMUNITY): Payer: Self-pay

## 2020-06-25 ENCOUNTER — Emergency Department (HOSPITAL_COMMUNITY)
Admission: EM | Admit: 2020-06-25 | Discharge: 2020-06-25 | Disposition: A | Discharge status: Home / Self Care | Payer: Medicare HMO | Attending: Emergency Medicine | Admitting: Emergency Medicine

## 2020-06-25 DIAGNOSIS — I10 Essential (primary) hypertension: Secondary | ICD-10-CM | POA: Diagnosis not present

## 2020-06-25 DIAGNOSIS — K029 Dental caries, unspecified: Secondary | ICD-10-CM | POA: Insufficient documentation

## 2020-06-25 DIAGNOSIS — Z79899 Other long term (current) drug therapy: Secondary | ICD-10-CM | POA: Insufficient documentation

## 2020-06-25 DIAGNOSIS — Z20822 Contact with and (suspected) exposure to covid-19: Secondary | ICD-10-CM | POA: Diagnosis not present

## 2020-06-25 DIAGNOSIS — E119 Type 2 diabetes mellitus without complications: Secondary | ICD-10-CM | POA: Insufficient documentation

## 2020-06-25 DIAGNOSIS — Z85118 Personal history of other malignant neoplasm of bronchus and lung: Secondary | ICD-10-CM | POA: Diagnosis not present

## 2020-06-25 DIAGNOSIS — Z85841 Personal history of malignant neoplasm of brain: Secondary | ICD-10-CM | POA: Insufficient documentation

## 2020-06-25 DIAGNOSIS — R059 Cough, unspecified: Secondary | ICD-10-CM | POA: Insufficient documentation

## 2020-06-25 DIAGNOSIS — Z87891 Personal history of nicotine dependence: Secondary | ICD-10-CM | POA: Insufficient documentation

## 2020-06-25 DIAGNOSIS — R0602 Shortness of breath: Secondary | ICD-10-CM | POA: Diagnosis not present

## 2020-06-25 LAB — RESP PANEL BY RT-PCR (FLU A&B, COVID) ARPGX2
Influenza A by PCR: NEGATIVE
Influenza B by PCR: NEGATIVE
SARS Coronavirus 2 by RT PCR: NEGATIVE

## 2020-06-25 NOTE — Discharge Instructions (Signed)
You were seen in the emergency department today for your chills, cough, and shortness of breath. Your physical exam and vital signs were reassuring.  Your oxygen levels were good both when you are at rest and when you are walking.  Your chest x-ray does not reveal any sign of infection in your lungs.   I recommended that you remain in the emergency department for blood tests, however you refused to do so and stated you wished to be discharged home.  You expressed your  wishes to leave after you voiced understanding of my medical recommendations to stay. You expressed your understanding that you are leaving [place].  Please follow-up closely with your oncologist as scheduled for Monday.  Return to the emergency department if develop worsening shortness of breath, chest pain, palpitations, or any other new severe symptoms.

## 2020-06-25 NOTE — ED Notes (Signed)
Pt O2 sat never dropped below 93 percent. Pt ambulated around the room and returned to the bedside at 100 O2 sat.

## 2020-06-25 NOTE — ED Provider Notes (Signed)
[facility] DEPT Provider Note   CSN: [id number] Arrival date & time: 06/25/20  0938     History Chief Complaint  Patient presents with  . Cough    Jared Shea. is a 65 y.o. male with history of stage IV non-small cell lung cancer with metastases to the brain who presents today with concern for chills, productive cough with yellow sputum, and shortness of breath.  He states that the shortness of breath woke him from his sleep yesterday.  He states anytime he lies flat on his back he feels he is unable to catch his breath and has to work harder to breathe.  This resolves when he sits upright, he has no dyspnea on exertion.  Patient states he presented today simply because his friend told him we would provide him with oxygen that he could take home to use him as needed.  He denies any chest pain, palpitations, abdominal pain, nausea, vomiting, diarrhea.  He denies any headaches, lightheadedness, confusion at this time.  I have reviewed this patient's medical records.  He has previously mentioned malignancy, type 2 diabetes, hypertension, and history of tobacco abuse.  Patient has history of focal seizures secondary to brain metastases, is on Keppra.  Has appointment on Monday to follow-up with his oncologist.  HPI     Past Medical History:  Diagnosis Date  . Brain mass   . Bursitis of right hip   . Chest pain 08/12/2018  . Chronic cough   . Dependence on nicotine from cigarettes   . Diabetes mellitus without complication ([place])   . Essential hypertension 07/28/2018  . nscl ca dx'd 12/2019  . Seizures ([place])   . Viral illness 03/23/2019    Patient Active Problem List   Diagnosis Date Noted  . Polyarthralgia 06/15/2020  . Headache 05/09/2020  . Encounter for antineoplastic immunotherapy 04/18/2020  . Goals of care, counseling/discussion 04/18/2020  . Hypokalemia 03/07/2020  . Hypotension 03/07/2020  . Adenocarcinoma, metastatic ([place]) 01/07/2020  .  Type 2 diabetes mellitus ([place]) 01/06/2020  . Adjustment disorder with depressed mood 01/06/2020  . Thyroid nodule 01/04/2020  . Non-small cell carcinoma of lung, stage 4 w/ mets to brain ([place]) 12/31/2019  . Partial symptomatic epilepsy with complex partial seizures, not intractable, without status epilepticus ([place]) 12/23/2019  . Brain metastases ([place]) 12/23/2019  . Focal seizures ([place]) 12/09/2019  . Tobacco use disorder 08/12/2018  . Essential hypertension 07/28/2018    Past Surgical History:  Procedure Laterality Date  . APPLICATION OF CRANIAL NAVIGATION N/A 01/07/2020   Procedure: APPLICATION OF CRANIAL NAVIGATION;  Surgeon: Ashok Pall, MD;  Location: [facility];  Service: Neurosurgery;  Laterality: N/A;  . CRANIOTOMY Right 01/07/2020   Procedure: RIGHT CRANIOTOMY FOR TUMOR RESECTION;  Surgeon: Ashok Pall, MD;  Location: [facility];  Service: Neurosurgery;  Laterality: Right;  rm 21  . ENDOBRONCHIAL ULTRASOUND N/A 12/21/2019   Procedure: ENDOBRONCHIAL ULTRASOUND;  Surgeon: Laurin Coder, MD;  Location: [facility];  Service: Pulmonary;  Laterality: N/A;  . FINE NEEDLE ASPIRATION  12/21/2019   Procedure: FINE NEEDLE ASPIRATION (FNA) LINEAR;  Surgeon: Laurin Coder, MD;  Location: [facility];  Service: Pulmonary;;  . IR IMAGING GUIDED PORT INSERTION  03/03/2020  . NO PAST SURGERIES    . VIDEO BRONCHOSCOPY N/A 12/21/2019   Procedure: VIDEO BRONCHOSCOPY WITHOUT FLUORO;  Surgeon: Laurin Coder, MD;  Location: [facility];  Service: Pulmonary;  Laterality: N/A;       Family History  Problem Relation  Age of Onset  . Heart disease Mother        CABG  . Kidney disease Sister   . Diabetes Brother   . Kidney disease Brother        KIDNEY TRANSPLANT  . Hypertension Sister   . Healthy Daughter   . Healthy Daughter   . Healthy Son     Social History   Tobacco Use  . Smoking status: Former Smoker    Packs/day: 1.00    Years: 48.00    Pack years: 48.00    Types: Cigarettes     Start date: 08/07/1970    Quit date: 12/15/2019    Years since quitting: 0.5  . Smokeless tobacco: Never Used  Vaping Use  . Vaping Use: Never used  Substance Use Topics  . Alcohol use: Not Currently  . Drug use: Never    Home Medications Prior to Admission medications   Medication Sig Start Date End Date Taking? Authorizing Provider  azithromycin (ZITHROMAX) 250 MG tablet TAKE 2 TABLETS BY MOUTH FOR 1 DAY THEN TAKE 1 TABLET BY MOUTH DAILY FOR 4 DAYS Patient taking differently: Take 250-500 mg by mouth as directed. TAKE 2 TABLETS BY MOUTH FOR 1 DAY THEN TAKE 1 TABLET BY MOUTH DAILY FOR 4 DAYS 06/14/20  Yes Tanner, Lyndon Code., PA-C  dexamethasone (DECADRON) 4 MG tablet Take 1 tablet (4 mg total) by mouth daily. 06/20/20  Yes Vaslow, Acey Lav, MD  ibuprofen (ADVIL) 200 MG tablet Take 400 mg by mouth every 6 (six) hours as needed for mild pain.   Yes [provider]  levETIRAcetam (KEPPRA) 750 MG tablet Take 1 tablet (750 mg total) by mouth 2 (two) times daily. 06/23/20  Yes Vaslow, Acey Lav, MD  losartan (COZAAR) 25 MG tablet Take 1 tablet (25 mg total) by mouth daily. 04/11/20 07/10/20 Yes Jose Persia, MD  Melatonin 10 MG TABS Take 10 mg by mouth at bedtime as needed (sleep).   Yes [provider]  pseudoephedrine-acetaminophen (TYLENOL SINUS) 30-500 MG TABS tablet Take 1 tablet by mouth every 4 (four) hours as needed (sinus pressure).   Yes [provider]  mirtazapine (REMERON) 15 MG tablet Take 1 tablet (15 mg total) by mouth at bedtime. Patient not taking: No sig reported 02/22/20   Heilingoetter, Cassandra L, PA-C  omeprazole (PRILOSEC) 20 MG capsule Take 1 capsule (20 mg total) by mouth daily. Patient not taking: No sig reported 02/22/20   Heilingoetter, Cassandra L, PA-C    Allergies    Penicillins  Review of Systems   Review of Systems  Constitutional: Positive for chills and fatigue. Negative for activity change, appetite change and fever.  HENT:  Negative.   Eyes: Negative.   Respiratory: Positive for cough and shortness of breath. Negative for chest tightness and wheezing.   Cardiovascular: Negative for chest pain, palpitations and leg swelling.  Gastrointestinal: Negative for abdominal distention, diarrhea, nausea and vomiting.  Genitourinary: Negative.   Musculoskeletal: Negative.   Skin: Negative.   Neurological: Negative.     Physical Exam Updated Vital Signs BP 137/89   Pulse 75   Temp 97.8 F (36.6 C) (Oral)   Resp (!) 25   SpO2 94%   Physical Exam Vitals and nursing note reviewed.  HENT:     Head: Normocephalic and atraumatic.     Nose: Nose normal.     Mouth/Throat:     Mouth: Mucous membranes are moist.     Dentition: Dental caries present.  Pharynx: Oropharynx is clear. Uvula midline. No oropharyngeal exudate or posterior oropharyngeal erythema.     Tonsils: No tonsillar exudate.  Eyes:     General: Lids are normal. Vision grossly intact.        Right eye: No discharge.        Left eye: No discharge.     Conjunctiva/sclera: Conjunctivae normal.     Pupils: Pupils are equal, round, and reactive to light.  Neck:     Trachea: Trachea and phonation normal.  Cardiovascular:     Rate and Rhythm: Normal rate and regular rhythm.     Pulses: Normal pulses.          Radial pulses are 2+ on the right side and 2+ on the left side.       Posterior tibial pulses are 2+ on the right side and 2+ on the left side.     Heart sounds: Normal heart sounds. No murmur heard.   Pulmonary:     Effort: Pulmonary effort is normal. No respiratory distress.     Breath sounds: Examination of the left-lower field reveals decreased breath sounds. Decreased breath sounds present. No wheezing or rales.     Comments: Coarse breath sounds throughout. Chest:     Chest wall: No deformity, swelling, tenderness, crepitus or edema.  Abdominal:     General: Bowel sounds are normal. There is no distension.     Tenderness: There is no  abdominal tenderness.  Musculoskeletal:        General: No deformity.     Cervical back: Normal range of motion and neck supple. No rigidity or crepitus. No pain with movement or spinous process tenderness.     Right lower leg: No edema.     Left lower leg: No edema.  Lymphadenopathy:     Cervical: No cervical adenopathy.  Skin:    General: Skin is warm and dry.     Capillary Refill: Capillary refill takes less than 2 seconds.  Neurological:     Mental Status: He is alert and oriented to person, place, and time. Mental status is at baseline.     Sensory: Sensation is intact.     Motor: Motor function is intact.     Gait: Gait is intact.  Psychiatric:        Mood and Affect: Mood normal.     ED Results / Procedures / Treatments   Labs (all labs ordered are listed, but only abnormal results are displayed) Labs Reviewed  RESP PANEL BY RT-PCR (FLU A&B, COVID) [id number]  CULTURE, BLOOD (SINGLE)  COMPREHENSIVE METABOLIC PANEL  CBC WITH DIFFERENTIAL/PLATELET  LACTIC ACID, PLASMA  LACTIC ACID, PLASMA    EKG EKG Interpretation  Date/Time:  Saturday June 25 2020 10:48:52 EST Ventricular Rate:  73 PR Interval:    QRS Duration: 91 QT Interval:  352 QTC Calculation: 388 R Axis:   77 Text Interpretation: Sinus rhythm Probable anteroseptal infarct, old Minimal ST depression, anterolateral leads Since last tracing Premature ventricular complexes have resolved Otherwise no significant change Confirmed by Pattricia Boss [phone]) on 06/25/2020 10:56:10 AM   Radiology DG Chest Port 1 View  Result Date: 06/25/2020 CLINICAL DATA:  Cough and shortness of breath. Reported history of lung carcinoma EXAM: PORTABLE CHEST 1 VIEW COMPARISON:  Chest radiograph December 21, 2019; chest CT April 05, 2020 FINDINGS: Port-A-Cath tip is near the cavoatrial junction. No pneumothorax. No edema or airspace opacity. Heart is upper normal in size with pulmonary vascularity normal. No adenopathy is  appreciable  by radiography. No bone lesions. IMPRESSION: Port-A-Cath tip near cavoatrial junction. No edema or airspace opacity. Heart upper normal in size. No adenopathy is appreciable by radiography; left infrahilar lymph node noted on prior CT not evident on current radiographic examination. Electronically Signed   By: Lowella Grip III M.D.   On: 06/25/2020 10:56    Procedures Procedures   Medications Ordered in ED Medications - No data to display  ED Course  I have reviewed the triage vital signs and the nursing notes.  Pertinent labs & imaging results that were available during my care of the patient were reviewed by me and considered in my medical decision making (see chart for details).  Clinical Course as of 06/25/20 1207  Sat Jun 25, 2020  1158 RN informed this provider the patient is expressing wishes to leave [facility].  I entered the patient's room and discussed benefits of staying in the emergency department for completing his medical evaluation, as well as risks of leaving prior to completing this work-up.  He voiced understanding of the risks of leaving [facility] and expressed wishes to proceed with discharge at this time.  Recommend he follow-up closely with his oncologist as scheduled on Monday.  [RS]    Clinical Course User Index [RS] Marcelline Temkin, Sharlene Dory   MDM Rules/Calculators/A&P                         65 year old male with stage IV non-small cell lung cancer who presents today with 24 hours of shortness of breath on lying flat and productive cough.  Additionally endorses chills at home.  Most recent course of chemotherapy was approximately 3 weeks ago.  Hypertensive on intake to 153/92.  Vital signs otherwise normal.  Patient's oxygen saturation was 100% on room air on intake.  Cardiopulmonary exam significant only for coarse breath sounds throughout.  Abdominal exam is benign.  Patient is neurologically intact.  Proceed with basic laboratory  studies, COVID test, chest x-ray, and EKG.  CXR revealed Port-A-Cath, no pulmonary edema or airspace opacities.  Note appreciable adenopathy on chest x-ray, as previously seen on CT.  EKG with sinus rhythm, no change from prior. Ambulatory 02 stable.  Patient expressing wishes to leave [facility].  Risks and benefits discussed, see ED course above.  Patient voiced wishes to proceed with discharge at this time.  Recommend he follow-up closely with his oncologist as scheduled on Monday.  Javarus voiced understanding of our discussion today and his abbreviated medical evaluation; each of his questions was answered to his expressed satisfaction.  Strict return precautions given.  Patient is stable at this time.  This chart was dictated using voice recognition software, Dragon. Despite the best efforts of this provider to proofread and correct errors, errors may still occur which can change documentation meaning.   Final Clinical Impression(s) / ED Diagnoses Final diagnoses:  None    Rx / DC Orders ED Discharge Orders    None       Aura Dials 06/25/20 1209    Pattricia Boss, MD 06/25/20 1510

## 2020-06-25 NOTE — ED Notes (Signed)
Pt is refusing care. Wants to leave AMA because MD will not give him O2 for home. MD made aware. Pt refused bloodwork and urine

## 2020-06-25 NOTE — ED Triage Notes (Signed)
Pt arrived via walk in, c/o productive cough, yellow sputum, states coughing spells started waking him up at night. Cough started yesterday as well as nasal congestion. Denies any sick contacts.   Currently undergoing chemotherapy for stage 4 lung CA

## 2020-06-27 ENCOUNTER — Telehealth: Payer: Self-pay | Admitting: Medical Oncology

## 2020-06-27 ENCOUNTER — Inpatient Hospital Stay: Payer: Medicare HMO

## 2020-06-27 ENCOUNTER — Encounter: Payer: Self-pay | Admitting: Neurology

## 2020-06-27 ENCOUNTER — Inpatient Hospital Stay: Payer: Medicare HMO | Admitting: Internal Medicine

## 2020-06-27 ENCOUNTER — Other Ambulatory Visit: Payer: Self-pay | Admitting: Internal Medicine

## 2020-06-27 ENCOUNTER — Ambulatory Visit (INDEPENDENT_AMBULATORY_CARE_PROVIDER_SITE_OTHER): Payer: Medicare HMO | Admitting: Neurology

## 2020-06-27 ENCOUNTER — Other Ambulatory Visit: Payer: Self-pay

## 2020-06-27 VITALS — BP 118/70 | HR 89 | Ht 73.0 in | Wt 195.8 lb

## 2020-06-27 DIAGNOSIS — C7931 Secondary malignant neoplasm of brain: Secondary | ICD-10-CM

## 2020-06-27 DIAGNOSIS — R519 Headache, unspecified: Secondary | ICD-10-CM | POA: Diagnosis not present

## 2020-06-27 DIAGNOSIS — G40209 Localization-related (focal) (partial) symptomatic epilepsy and epileptic syndromes with complex partial seizures, not intractable, without status epilepticus: Secondary | ICD-10-CM

## 2020-06-27 HISTORY — DX: Headache, unspecified: R51.9

## 2020-06-27 MED ORDER — SUMATRIPTAN SUCCINATE 25 MG PO TABS: 25.0000 mg | ORAL_TABLET | ORAL | 6 refills | Status: DC | PRN

## 2020-06-27 MED ORDER — NORTRIPTYLINE HCL 25 MG PO CAPS: 50.0000 mg | ORAL_CAPSULE | Freq: Every day | ORAL | 11 refills | Status: DC

## 2020-06-27 NOTE — Progress Notes (Signed)
HISTORICAL  Jared Mom. is a 65 year old male, seen in request by his primary care physician Dr.Agyei, Obed K, for evaluation of complex partial seizure, metastatic brain lesion, initial evaluation was on December 23, 2019.  I reviewed and summarized the referring note. He is a longtime smoker, works as [name]  In July 2021, at work, while hooking up a wall socket, he suddenly felt hot, then started to have mouth twitching lasting for 5 minutes, followed by confusion afterwards, then went away  Second episode was 2 weeks later, he woke up from sleep with left mouth muscle twitching, difficulty breathing, lasting for 5 minutes, again followed by postevent confusion, lasting few more minutes  He was diagnosed with complex partial seizure, personally reviewed CT, and MRI of the brain with and without contrast December 14, 2019, right posterior frontal cortex 13.5 mm enhancing lesion with surrounding vasogenic edema, most likely a solitary metastatic.  He has radiation oncology appointment pending with Dr. Lisbeth Renshaw on December 24, 2019  CT of the chest/pelvis with without contrast December 14, 2019, mediastinum and left hilar adenopathy, 4 mm left upper lobe nodule, low attenuating focus inferior to left hilum likely enlarged lymph node  He had transbronchial needle aspiration with flexible bronchoscopy under endobronchial ultrasound guidance on December 21 2019, pathology confirmed malignant cells consistent with metastatic non-small cell carcinoma  Laboratory evaluation August 2021, negative HIV, BMP, CBC  He complains of frequent headaches, dizziness, fatigue taking Keppra 750 twice a day, there was no recurrent seizure  UPDATE Jun 27 2020: He underwent right craniotomy for tumor resection by Dr. Christella Noa on January 07, 2020 pathology consistent with metastatic adenocarcinoma.   I reviewed most recent oncology evaluation by Dr. Mickeal Skinner on June 20, 2020, stage IV non-small cell carcinoma, with  metastatic lesion to brain, radiation therapy to left arm all from September 13 to March 14, 2020, also received brain radiation in April 22, 2020, received chemo/immunotherapy cemiplimab  since April 29, 2020, complains of frequent headaches was given short course of dexamethasone 4 mg for 5 days, suggested ibuprofen 800 mg as needed,  I personally reviewed MRI of brain with without contrast on April 14, 2020, in comparison with previous MRI in August 2021, resection cavity no posterior right frontal lobe which resulted edema, no evidence of locally recurrent tumor, 3 new lesions, left cerebellar hemisphere, right occipital lobe, left frontal lobe  CT chest in November 2021, interval decrease size of dominant left infrahilar nodes, hypermetabolic on PET/CT, consistent with response to therapy, stable subcardinal and left hilar lymph nodes.  Laboratory evaluation in February 2022, CBC showed hemoglobin of 11.9, mild elevated liver functional test AST 45, ALT 48, normal TSH 1.2, A1c 4.4,  He reported a history of intermittent headaches since 20s, getting worse since treatment in summer 2021, no complaints of 3-4 times of headache each week, short lasting less than 30 minutes, throbbing,  no light noise sensitivity, no nausea, worry about the headache would cause more brain damage, he has been taking frequent ibuprofen, 2 tablets twice a day,4-5 times each week,  He denies new onset focal symptoms, had no recurrent seizure   REVIEW OF SYSTEMS: Full 14 system review of systems performed and notable only for as above All other review of systems were negative.  ALLERGIES: Allergies  Allergen Reactions  . Penicillins Rash    Did it involve swelling of the face/tongue/throat, SOB, or low BP? N Did it involve sudden or severe rash/hives, skin peeling, or any reaction  on the inside of your mouth or nose? Y Did you need to seek medical attention at a hospital or doctor's office? Y When did it  last happen?childhood If all above answers are "NO", may proceed with cephalosporin use.     HOME MEDICATIONS: Current Outpatient Medications  Medication Sig Dispense Refill  . levETIRAcetam (KEPPRA) 750 MG tablet Take 1 tablet (750 mg total) by mouth 2 (two) times daily. 180 tablet 4  . losartan (COZAAR) 25 MG tablet Take 1 tablet (25 mg total) by mouth daily. 30 tablet 2  . dexamethasone (DECADRON) 4 MG tablet Take 1 tablet (4 mg total) by mouth daily. (Patient not taking: Reported on 06/27/2020) 5 tablet 0  . ibuprofen (ADVIL) 200 MG tablet Take 400 mg by mouth every 6 (six) hours as needed for mild pain. (Patient not taking: Reported on 06/27/2020)    . Melatonin 10 MG TABS Take 10 mg by mouth at bedtime as needed (sleep). (Patient not taking: Reported on 06/27/2020)    . pseudoephedrine-acetaminophen (TYLENOL SINUS) 30-500 MG TABS tablet Take 1 tablet by mouth every 4 (four) hours as needed (sinus pressure). (Patient not taking: Reported on 06/27/2020)     No current facility-administered medications for this visit.    PAST MEDICAL HISTORY: Past Medical History:  Diagnosis Date  . Brain mass   . Bursitis of right hip   . Chest pain 08/12/2018  . Chronic cough   . Dependence on nicotine from cigarettes   . Diabetes mellitus without complication ([place])   . Essential hypertension 07/28/2018  . nscl ca dx'd 12/2019  . Seizures ([place])   . Viral illness 03/23/2019    PAST SURGICAL HISTORY: Past Surgical History:  Procedure Laterality Date  . APPLICATION OF CRANIAL NAVIGATION N/A 01/07/2020   Procedure: APPLICATION OF CRANIAL NAVIGATION;  Surgeon: Ashok Pall, MD;  Location: [facility];  Service: Neurosurgery;  Laterality: N/A;  . CRANIOTOMY Right 01/07/2020   Procedure: RIGHT CRANIOTOMY FOR TUMOR RESECTION;  Surgeon: Ashok Pall, MD;  Location: [facility];  Service: Neurosurgery;  Laterality: Right;  rm 21  . ENDOBRONCHIAL ULTRASOUND N/A 12/21/2019   Procedure: ENDOBRONCHIAL ULTRASOUND;   Surgeon: Laurin Coder, MD;  Location: [facility];  Service: Pulmonary;  Laterality: N/A;  . FINE NEEDLE ASPIRATION  12/21/2019   Procedure: FINE NEEDLE ASPIRATION (FNA) LINEAR;  Surgeon: Laurin Coder, MD;  Location: [facility];  Service: Pulmonary;;  . IR IMAGING GUIDED PORT INSERTION  03/03/2020  . NO PAST SURGERIES    . VIDEO BRONCHOSCOPY N/A 12/21/2019   Procedure: VIDEO BRONCHOSCOPY WITHOUT FLUORO;  Surgeon: Laurin Coder, MD;  Location: [facility];  Service: Pulmonary;  Laterality: N/A;    FAMILY HISTORY: Family History  Problem Relation Age of Onset  . Heart disease Mother        CABG  . Kidney disease Sister   . Diabetes Brother   . Kidney disease Brother        KIDNEY TRANSPLANT  . Hypertension Sister   . Healthy Daughter   . Healthy Daughter   . Healthy Son     SOCIAL HISTORY: Social History   Socioeconomic History  . Marital status: Divorced    Spouse name: Not on file  . Number of children: Not on file  . Years of education: Not on file  . Highest education level: Not on file  Occupational History  . Not on file  Tobacco Use  . Smoking status: Former Smoker    Packs/day: 1.00  Years: 48.00    Pack years: 48.00    Types: Cigarettes    Start date: 08/07/1970    Quit date: 12/15/2019    Years since quitting: 0.5  . Smokeless tobacco: Never Used  Vaping Use  . Vaping Use: Never used  Substance and Sexual Activity  . Alcohol use: Not Currently  . Drug use: Never  . Sexual activity: Not on file    Comment: YES  Other Topics Concern  . Not on file  Social History Narrative  . Not on file   Social Determinants of Health   Financial Resource Strain: High Risk  . Difficulty of Paying Living Expenses: Hard  Food Insecurity: No Food Insecurity  . Worried About Charity fundraiser in the Last Year: Never true  . Ran Out of Food in the Last Year: Never true  Transportation Needs: Unmet Transportation Needs  . Lack of Transportation  (Medical): Yes  . Lack of Transportation (Non-Medical): Yes  Physical Activity: Not on file  Stress: Not on file  Social Connections: Not on file  Intimate Partner Violence: Not on file     PHYSICAL EXAM   Vitals:   06/27/20 1016  BP: 118/70  Pulse: 89  SpO2: 96%  Weight: 195 lb 12.8 oz (88.8 kg)  Height: _0  (1.854 m)   Not recorded     Body mass index is 25.83 kg/m.  PHYSICAL EXAMNIATION:  Gen: NAD, conversant, well nourised, well groomed                     Cardiovascular: Regular rate rhythm, no peripheral edema, warm, nontender. Eyes: Conjunctivae clear without exudates or hemorrhage Neck: Supple, no carotid bruits. Pulmonary: Clear to auscultation bilaterally   NEUROLOGICAL EXAM:  MENTAL STATUS: Speech/cognition: Awake, alert, oriented to history taking and casual conversation   CRANIAL NERVES: CN II: Visual fields are full to confrontation. Pupils are round equal and briskly reactive to light. CN III, IV, VI: extraocular movement are normal. No ptosis. CN V: Facial sensation is intact to light touch CN VII: Face is symmetric with normal eye closure  CN VIII: Hearing is normal to causal conversation. CN IX, X: Phonation is normal. CN XI: Head turning and shoulder shrug are intact  MOTOR: Mild fixation of left arm upon rapid rotating movement  REFLEXES: Reflexes are 2+ and symmetric at the biceps, triceps, knees, and ankles. Plantar responses are flexor.  SENSORY: Intact to light touch, pinprick and vibratory sensation are intact in fingers and toes.  COORDINATION: There is no trunk or limb dysmetria noted.  GAIT/STANCE: He can get up from seated position, pushing on his knees, mildly antalgic due to bilateral knee pain   DIAGNOSTIC DATA (LABS, IMAGING, TESTING) - I reviewed patient records, labs, notes, testing and imaging myself where available.   ASSESSMENT AND PLAN  Jared Shea. is a 64 y.o. male   Complex partial seizure due to  metastatic right posterior frontal lesion Brain metastatic lung  cancer, adenocarcinoma, status post right craniotomy in August 2021 Frequent headaches  Keep Keppra 750 mg twice a day, he has no recurrent seizure  Repeat MRI of the brain with without contrast in December 2021 showed resection of right frontal posterior lobe lesion, was resolved edema, no evidence of recurrent tumor, but there was evidence of 3 new brain metastases measuring up to 3 mm  I have educated the patient, headache it would not cause more damage to him, he does not have to  treat every minor transient headaches, only treat moderate to severe headaches,  Add on nortriptyline 25 mg titrating to 2 tablets every night as preventive medications  Imitrex 25 mg as needed for moderate to severe headaches, may combine with Aleve as needed    Marcial Pacas, M.D. Ph.D.  [facility] [address], [place], [place] [address] Ph: [phone] Fax: [phone]  CC:  Jean Rosenthal, MD [address] [place],  [place] [address]

## 2020-06-27 NOTE — Telephone Encounter (Signed)
Pt said he did not know about his appts today for MD and Infusion  CT scan was never  scheduled  for 2/11. I moved out expected date . CT scheduled for 2/22 Schedule message sent to bring pt in this week for labs and [facility].

## 2020-06-28 ENCOUNTER — Other Ambulatory Visit: Payer: Self-pay | Admitting: Internal Medicine

## 2020-06-28 DIAGNOSIS — I10 Essential (primary) hypertension: Secondary | ICD-10-CM

## 2020-06-28 DIAGNOSIS — G40209 Localization-related (focal) (partial) symptomatic epilepsy and epileptic syndromes with complex partial seizures, not intractable, without status epilepticus: Secondary | ICD-10-CM

## 2020-06-28 MED ORDER — LOSARTAN POTASSIUM 25 MG PO TABS: 25.0000 mg | ORAL_TABLET | Freq: Every day | ORAL | 2 refills | Status: DC

## 2020-06-28 MED ORDER — LEVETIRACETAM 750 MG PO TABS: 750.0000 mg | ORAL_TABLET | Freq: Two times a day (BID) | ORAL | 4 refills | Status: DC

## 2020-06-28 MED ORDER — LOSARTAN POTASSIUM 25 MG PO TABS
25.0000 mg | ORAL_TABLET | Freq: Every day | ORAL | 11 refills | Status: DC
Start: 2020-06-28 — End: 2021-06-15

## 2020-06-28 NOTE — Telephone Encounter (Signed)
Called pt to let him know keppra was refilled on 2/10 by Dr Mickeal Skinner. Stated he's almost out. I called [facility] who stated it was last refilled 2/12 by [facility] in [place] so too early to refill. Pt stated he has not been to [facility]; informed pt to talk to the pharmacy. Pt insisted his PCP should be able to refill. I then informed pt to called Dr Renda Rolls office - stated he will.

## 2020-06-28 NOTE — Telephone Encounter (Signed)
Refill Request  losartan (COZAAR) 25 MG tablet  levETIRAcetam (KEPPRA) 750 MG tablet  [id number] - [place], [place] - [address] AT [address]

## 2020-06-30 ENCOUNTER — Other Ambulatory Visit: Payer: Medicare HMO

## 2020-06-30 ENCOUNTER — Telehealth: Payer: Self-pay

## 2020-06-30 ENCOUNTER — Ambulatory Visit: Payer: Medicare HMO | Admitting: Internal Medicine

## 2020-06-30 ENCOUNTER — Ambulatory Visit: Payer: Medicare HMO

## 2020-06-30 NOTE — Telephone Encounter (Signed)
Pt no-showed his appts again today. I have left a message for his sister Ivin Booty and daughter Arita Miss advising of this. I requested they call back when they are ready to reschedule.   I have also spoken with the pt and he says he didn't know he had an appointment today. I advised it was a rescheduled appt because he missed his previous one on 2/14 and he spoke to Diane to r/s it to today. Pt states he didn't know this.  I reminded the pt of his appointment on Tuesday and he states he has written it down as a reminder.

## 2020-07-04 ENCOUNTER — Telehealth: Payer: Self-pay | Admitting: Neurology

## 2020-07-04 NOTE — Telephone Encounter (Signed)
Pt. states he wants to discuss with Dr. his condition/diagnosis of what's going on with him. Please advise.

## 2020-07-04 NOTE — Telephone Encounter (Signed)
The patient was calling with questions about his prognosis related to his cancer. He did not realize he had called the neurology office. He is going to discuss his condition in more detail with his oncologist.

## 2020-07-05 ENCOUNTER — Ambulatory Visit (HOSPITAL_COMMUNITY)
Admission: RE | Admit: 2020-07-05 | Discharge: 2020-07-05 | Disposition: A | Discharge status: Home / Self Care | Payer: Medicare HMO | Source: Ambulatory Visit | Attending: Internal Medicine | Admitting: Internal Medicine

## 2020-07-05 ENCOUNTER — Inpatient Hospital Stay: Payer: Medicare HMO

## 2020-07-05 ENCOUNTER — Other Ambulatory Visit: Payer: Self-pay

## 2020-07-05 ENCOUNTER — Encounter (HOSPITAL_COMMUNITY): Payer: Self-pay

## 2020-07-05 DIAGNOSIS — C3490 Malignant neoplasm of unspecified part of unspecified bronchus or lung: Secondary | ICD-10-CM | POA: Diagnosis not present

## 2020-07-05 DIAGNOSIS — C7931 Secondary malignant neoplasm of brain: Secondary | ICD-10-CM | POA: Diagnosis not present

## 2020-07-05 DIAGNOSIS — Z95828 Presence of other vascular implants and grafts: Secondary | ICD-10-CM

## 2020-07-05 HISTORY — DX: Presence of other vascular implants and grafts: Z95.828

## 2020-07-05 LAB — CBC WITH DIFFERENTIAL (CANCER CENTER ONLY)
Abs Immature Granulocytes: 0.02 10*3/uL (ref 0.00–0.07)
Basophils Absolute: 0 10*3/uL (ref 0.0–0.1)
Basophils Relative: 1 %
Eosinophils Absolute: 0.1 10*3/uL (ref 0.0–0.5)
Eosinophils Relative: 2 %
HCT: 35.8 % — ABNORMAL LOW (ref 39.0–52.0)
Hemoglobin: 12.2 g/dL — ABNORMAL LOW (ref 13.0–17.0)
Immature Granulocytes: 0 %
Lymphocytes Relative: 19 %
Lymphs Abs: 1.2 10*3/uL (ref 0.7–4.0)
MCH: 29.7 pg (ref 26.0–34.0)
MCHC: 34.1 g/dL (ref 30.0–36.0)
MCV: 87.1 fL (ref 80.0–100.0)
Monocytes Absolute: 0.6 10*3/uL (ref 0.1–1.0)
Monocytes Relative: 10 %
Neutro Abs: 4.4 10*3/uL (ref 1.7–7.7)
Neutrophils Relative %: 68 %
Platelet Count: 322 10*3/uL (ref 150–400)
RBC: 4.11 MIL/uL — ABNORMAL LOW (ref 4.22–5.81)
RDW: 13.9 % (ref 11.5–15.5)
WBC Count: 6.3 10*3/uL (ref 4.0–10.5)
nRBC: 0 % (ref 0.0–0.2)

## 2020-07-05 LAB — CMP (CANCER CENTER ONLY)
ALT: 26 U/L (ref 0–44)
AST: 22 U/L (ref 15–41)
Albumin: 3.9 g/dL (ref 3.5–5.0)
Alkaline Phosphatase: 97 U/L (ref 38–126)
Anion gap: 6 (ref 5–15)
BUN: 10 mg/dL (ref 8–23)
CO2: 23 mmol/L (ref 22–32)
Calcium: 8.9 mg/dL (ref 8.9–10.3)
Chloride: 108 mmol/L (ref 98–111)
Creatinine: 0.82 mg/dL (ref 0.61–1.24)
GFR, Estimated: >60 mL/min (ref >60)
Glucose, Bld: 106 mg/dL — ABNORMAL HIGH (ref 70–99)
Potassium: 3.9 mmol/L (ref 3.5–5.1)
Sodium: 137 mmol/L (ref 135–145)
Total Bilirubin: 0.4 mg/dL (ref 0.3–1.2)
Total Protein: 7.7 g/dL (ref 6.5–8.1)

## 2020-07-05 MED ORDER — IOHEXOL 300 MG/ML  SOLN
75.0000 mL | Freq: Once | INTRAMUSCULAR | Status: AC | PRN
  Administered 2020-07-05: 75 mL via INTRAVENOUS

## 2020-07-05 MED ORDER — SODIUM CHLORIDE 0.9% FLUSH
10.0000 mL | Freq: Once | INTRAVENOUS | Status: AC
  Administered 2020-07-05: 10 mL
  Filled 2020-07-05: qty 10

## 2020-07-05 MED ORDER — HEPARIN SOD (PORK) LOCK FLUSH 100 UNIT/ML IV SOLN
INTRAVENOUS | Status: AC
  Filled 2020-07-05: qty 5

## 2020-07-05 MED ORDER — HEPARIN SOD (PORK) LOCK FLUSH 100 UNIT/ML IV SOLN: 500.0000 [IU] | Freq: Once | INTRAVENOUS | Status: DC

## 2020-07-11 ENCOUNTER — Telehealth: Payer: Self-pay

## 2020-07-11 ENCOUNTER — Telehealth: Payer: Self-pay | Admitting: Medical Oncology

## 2020-07-11 NOTE — Telephone Encounter (Signed)
Called pt back at his request. Pt states that he wants the results of his recent MRI last week. This RN informed pt that MD has to give him the results. Informed pt that scheduling will call him to set up an appt to discuss his results. Pt verbalizes understanding.

## 2020-07-11 NOTE — Telephone Encounter (Signed)
Asking for information on pt status . I told her she is not on HIPPA form and to have his other sister or daughter call.

## 2020-07-12 ENCOUNTER — Telehealth: Payer: Self-pay | Admitting: Medical Oncology

## 2020-07-12 NOTE — Telephone Encounter (Signed)
Left vm to return my call . I do not know what shewas calling about.

## 2020-07-13 ENCOUNTER — Telehealth: Payer: Self-pay

## 2020-07-13 NOTE — Telephone Encounter (Signed)
RTC, patient c/o his bones and joints aching, he is asking what might be causing this.  Pt states he is currently receiving chemotherapy infusions.  RN asked patient if he has asked Oncology office if this might be a symptom of the chemo or Cancer, he states he "has tried calling, but can't get them to answer him".  RN recommended reaching out to oncology office (recent calls and future appt's are noted w/ oncology).   Will forward to PCP to see if he has any add'l advice. SChaplin, RN,BSN

## 2020-07-13 NOTE — Telephone Encounter (Signed)
Hard to tell based on description. Likely due to chemo but could be due to mets. Agree he needs to talk to his oncologist. If he wants urgent evaluation, he can come see [place] in the clinic in person.

## 2020-07-13 NOTE — Telephone Encounter (Signed)
Pts sister Jared Shea presented to the [facility] wanting to know an update on the pt. I advised her that we have not seen the pt since January 2022 and he has been missing his appts. She states the pt has been telling him he has been coming and had an MRI last month. I advised her he did not come to his appts and was last seen by Dr. Mickeal Skinner for his headaches and a brain MRI was ordered for him but he has not scheduled it as of yet. She states she thought he was taking care of things but now realizes he has not. I shared with Jared Shea given his condition, he is likely not remembering things.   Ms. Jared Shea requested a print out of the pts upcoming appts and this was provided. I also provided her with the phone number to Radiology scheduling for the MRI. She states their other sister, Janae Bridgeman, will try to come to the appt with the pt on Monday 07/18/20 but if she is not able to make it, if we could call her during the appt so she can participate. I have added an appt note to call Arlene during the appt. Ms. Jared Shea states now that she knows he is not managing his care the way he states he has, her and her sister will help him more and was appreciative of the information because she has noticed some changes in him.

## 2020-07-13 NOTE — Telephone Encounter (Signed)
Pls contact pt [phone]

## 2020-07-14 NOTE — Progress Notes (Signed)
[facility] OFFICE PROGRESS NOTE  Jean Rosenthal, MD [address] [place] [address]  DIAGNOSIS: Stage IV (TX, N2, M1 C) non-small cell lung cancer, adenocarcinoma diagnosed in August 2021 and presented with solitary brain metastasis in addition to mediastinal lymphadenopathy.  PDL1 Expression 70%  Molecular Biomarkers: No actionable mutations  PRIOR THERAPY: 1)Status post right craniotomy with tumor resection followed by [facility] to solitary brain metastasis under the care of Dr. Lisbeth Renshaw and Dr. Sherral Hammers. 2)Concurrent chemoradiation with weekly carboplatin for AUC of 2 and paclitaxel 45 mg/M2. First dose 02/01/2020.Status post5cycles.Last dose was given February 29, 2020. 3) SRS to the new metastatic brain lesions under the care of Dr. Lisbeth Renshaw. Completed on 04/22/20.  CURRENT THERAPY:  First-line treatment with immunotherapy with Libtayo (Cempilimab) 350 mg IV every 3 weeks. First dose April 29, 2020. Status post 3 cycles.   INTERVAL HISTORY: Jared Shea. 65 y.o. male returns to the clinic today for a follow up visit accompanied by his sister. The patient was last seen in the [facility] on 06/06/20. The patient was supposed to have a follow up appointment and infusion on 07/04/20 but the patient did not show up for that appointment. He did have his restaging CT scan on 07/05/20 as scheduled though. Regarding repeat imaging, it appears that the patient was supposed to have a repeat brain MRI around 07/22/20 before his next follow up visit with neuro-oncology on 07/25/20. We have rescheduled this for him for 07/23/20.  The patient recently had a follow up appointment with his neurologist. He discussed his headaches. His neurologist added nortriptyline and Imitrex for severe headaches. He was also advised to take aleve if needed.   Also in the interval, it appears the patient called his PCP regarding joint pains. The patient noticed that when he took decadron in the past  that it helped alleviate his joint pain. His arthralgias are migratory and notes most of the pain in his knees; however, he also has joint pain in his elbows and shoulders periodically. He tried tylenol and NSAIDs.   Otherwise, the patient denies recent fevers, chills, night sweats, or weight loss.  The patient states he has dyspnea with exertion and a mild cough "every now and then".  He does not take any medication for his cough. He denies any nausea, vomiting, diarrhea, or constipation. He denies any rashes or skin changes. He recently had a restaign CT scan performed. He is here today for evaluation and to review his scan results before starting cycle #4.    MEDICAL HISTORY: Past Medical History:  Diagnosis Date   Brain mass    Bursitis of right hip    Chest pain 08/12/2018   Chronic cough    Dependence on nicotine from cigarettes    Diabetes mellitus without complication ([place])    Essential hypertension 07/28/2018   nscl ca dx'd 12/2019   Seizures ([place])    Viral illness 03/23/2019    ALLERGIES:  is allergic to penicillins.  MEDICATIONS:  Current Outpatient Medications  Medication Sig Dispense Refill   dexamethasone (DECADRON) 4 MG tablet Take 1 tablet (4 mg total) by mouth daily. (Patient not taking: Reported on 06/27/2020) 5 tablet 0   ibuprofen (ADVIL) 200 MG tablet Take 400 mg by mouth every 6 (six) hours as needed for mild pain. (Patient not taking: Reported on 06/27/2020)     levETIRAcetam (KEPPRA) 750 MG tablet Take 1 tablet (750 mg total) by mouth 2 (two) times daily. [place]  tablet 4   losartan (COZAAR) 25 MG tablet Take 1 tablet (25 mg total) by mouth daily. 30 tablet 11   Melatonin 10 MG TABS Take 10 mg by mouth at bedtime as needed (sleep). (Patient not taking: Reported on 06/27/2020)     nortriptyline (PAMELOR) 25 MG capsule Take 2 capsules (50 mg total) by mouth at bedtime. 60 capsule 11   pseudoephedrine-acetaminophen (TYLENOL SINUS) 30-500 MG TABS tablet Take  1 tablet by mouth every 4 (four) hours as needed (sinus pressure). (Patient not taking: Reported on 06/27/2020)     SUMAtriptan (IMITREX) 25 MG tablet Take 1 tablet (25 mg total) by mouth every 2 (two) hours as needed for migraine. May repeat in 2 hours if headache persists or recurs. 10 tablet 6   No current facility-administered medications for this visit.    SURGICAL HISTORY:  Past Surgical History:  Procedure Laterality Date   APPLICATION OF CRANIAL NAVIGATION N/A 01/07/2020   Procedure: APPLICATION OF CRANIAL NAVIGATION;  Surgeon: Ashok Pall, MD;  Location: [facility];  Service: Neurosurgery;  Laterality: N/A;   CRANIOTOMY Right 01/07/2020   Procedure: RIGHT CRANIOTOMY FOR TUMOR RESECTION;  Surgeon: Ashok Pall, MD;  Location: [facility];  Service: Neurosurgery;  Laterality: Right;  rm 21   ENDOBRONCHIAL ULTRASOUND N/A 12/21/2019   Procedure: ENDOBRONCHIAL ULTRASOUND;  Surgeon: Laurin Coder, MD;  Location: [facility];  Service: Pulmonary;  Laterality: N/A;   FINE NEEDLE ASPIRATION  12/21/2019   Procedure: FINE NEEDLE ASPIRATION (FNA) LINEAR;  Surgeon: Laurin Coder, MD;  Location: [facility];  Service: Pulmonary;;   IR IMAGING GUIDED PORT INSERTION  03/03/2020   NO PAST SURGERIES     VIDEO BRONCHOSCOPY N/A 12/21/2019   Procedure: VIDEO BRONCHOSCOPY WITHOUT FLUORO;  Surgeon: Laurin Coder, MD;  Location: [facility];  Service: Pulmonary;  Laterality: N/A;    REVIEW OF SYSTEMS:   Review of Systems  Constitutional: Negative for appetite change, chills, fatigue, fever and unexpected weight change.  HENT: Negative for mouth sores, nosebleeds, sore throat and trouble swallowing.   Eyes: Negative for eye problems and icterus.  Respiratory: Positive for occasional shortness of breath and mild cough. Negative for hemoptysis and wheezing.   Cardiovascular: Negative for chest pain and leg swelling.  Gastrointestinal: Negative for abdominal pain, constipation, diarrhea, nausea  and vomiting.  Genitourinary: Negative for bladder incontinence, difficulty urinating, dysuria, frequency and hematuria.   Musculoskeletal: Positive for arthralgias. Negative for back pain, gait problem, neck pain and neck stiffness.  Skin: Negative for itching and rash.  Neurological: Positive for occasional headaches. Negative for dizziness, extremity weakness, gait problem, headaches, light-headedness and seizures.  Hematological: Negative for adenopathy. Does not bruise/bleed easily.  Psychiatric/Behavioral: Negative for confusion, depression and sleep disturbance. The patient is not nervous/anxious.     PHYSICAL EXAMINATION:  There were no vitals taken for this visit.  ECOG PERFORMANCE STATUS: 1 - Symptomatic but completely ambulatory  Physical Exam  Constitutional: Oriented to person, place, and time and well-developed, well-nourished, and in no distress. HENT:  Head: Normocephalic and atraumatic.  Mouth/Throat: Oropharynx is clear and moist. No oropharyngeal exudate.  Eyes: Conjunctivae are normal. Right eye exhibits no discharge. Left eye exhibits no discharge. No scleral icterus.  Neck: Normal range of motion. Neck supple.  Cardiovascular: Normal rate, regular rhythm, normal heart sounds and intact distal pulses.   Pulmonary/Chest: Effort normal and breath sounds normal. No respiratory distress. No wheezes. No rales.  Abdominal: Soft. Bowel sounds are normal. Exhibits no distension and no  mass. There is no tenderness.  Musculoskeletal: Normal range of motion. Exhibits no edema.  Lymphadenopathy:    No cervical adenopathy.  Neurological: Alert and oriented to person, place, and time. Exhibits normal muscle tone. Gait normal. Coordination normal.  Skin: Skin is warm and dry. No rash noted. Not diaphoretic. No erythema. No pallor.  Psychiatric: Mood, memory and judgment normal.  Vitals reviewed.  LABORATORY DATA: Lab Results  Component Value Date   WBC 6.3 07/05/2020   HGB  12.2 (L) 07/05/2020   HCT 35.8 (L) 07/05/2020   MCV 87.1 07/05/2020   PLT 322 07/05/2020      Chemistry      Component Value Date/Time   NA 137 07/05/2020 1413   NA 140 11/23/2019 1516   K 3.9 07/05/2020 1413   CL 108 07/05/2020 1413   CO2 23 07/05/2020 1413   BUN 10 07/05/2020 1413   BUN 14 11/23/2019 1516   CREATININE 0.82 07/05/2020 1413      Component Value Date/Time   CALCIUM 8.9 07/05/2020 1413   ALKPHOS 97 07/05/2020 1413   AST 22 07/05/2020 1413   ALT 26 07/05/2020 1413   BILITOT 0.4 07/05/2020 1413       RADIOGRAPHIC STUDIES:  CT Chest W Contrast  Result Date: 07/06/2020 CLINICAL DATA:  Primary Cancer Type: Lung Imaging Indication: Assess response to therapy Interval therapy since last imaging? Yes Initial Cancer Diagnosis Date: 12/21/2019; Established by: Biopsy-proven Detailed Pathology: Stage IV non-small cell lung cancer, adenocarcinoma. Primary Tumor location: Mediastinal and left hilar adenopathy. Brain metastasis. Surgeries: No. Chemotherapy: Yes; Ongoing? No; Most recent administration: 02/29/2020 Immunotherapy?  Yes; Type: Cempilimab; Ongoing? Yes Radiation therapy? Yes Date Range: 01/25/2020 - 03/14/2020; Target: Left lung Date Range: 04/22/2020; Target: Brain EXAM: CT CHEST WITH CONTRAST TECHNIQUE: Multidetector CT imaging of the chest was performed during intravenous contrast administration. CONTRAST:  80m OMNIPAQUE IOHEXOL 300 MG/ML  SOLN COMPARISON:  Most recent CT chest 04/05/2020.  01/01/2020 PET-CT. FINDINGS: Cardiovascular: Normal heart size. No significant pericardial effusion/thickening. Left anterior descending and left circumflex coronary atherosclerosis. Right internal jugular Port-A-Cath terminates at the cavoatrial junction. Great vessels are normal in course and caliber. No central pulmonary emboli. Mediastinum/Nodes: Stable hypodense 1.1 cm right thyroid nodule, previously evaluated by ultrasound. This has been evaluated on previous imaging. (ref:  J Am Coll Radiol. 2015 Feb;12(2): 143-50). Unremarkable esophagus. No axillary adenopathy. Mildly enlarged 1.1 cm subcarinal node (series 2/image 78), decreased from 2.0 cm. Previously mildly enlarged 1.2 cm low left paratracheal node is decreased to 0.9 cm (series 2/image 64). Previously mildly enlarged 1.4 cm left infrahilar node is decreased to 0.9 cm (series 2/image 88). No newly enlarged mediastinal or hilar nodes. Lungs/Pleura: No pneumothorax. No pleural effusion. Sharply marginated patchy paramediastinal consolidation in the left greater than right lungs is new and probably representing early postradiation change. A few indistinct small nodular opacities scattered in the periphery of both lungs, largest 6 mm in the anterior left upper lobe (series 7/image 50) and 4 mm in the left lower lobe (series 7/image 100) are new. Upper abdomen: No acute abnormality. Musculoskeletal: No aggressive appearing focal osseous lesions. Mild thoracic spondylosis. IMPRESSION: 1. Continued positive response to therapy. Mediastinal and left infrahilar adenopathy is again decreased. No findings in the chest highly suspicious for new or progressive metastatic disease. 2. Sharply marginated patchy paramediastinal consolidation in the left greater than right lungs, new, compatible with early postradiation change. 3. A few indistinct small nodular opacities scattered in the periphery of both lungs,  largest 6 mm in the anterior left upper lobe, indeterminate, favor treatment related inflammatory opacities. Attention on follow-up chest CT recommended in 3 months. 4. Two-vessel coronary atherosclerosis. Electronically Signed   By: Ilona Sorrel M.D.   On: 07/06/2020 10:38   DG Chest Port 1 View  Result Date: 06/25/2020 CLINICAL DATA:  Cough and shortness of breath. Reported history of lung carcinoma EXAM: PORTABLE CHEST 1 VIEW COMPARISON:  Chest radiograph December 21, 2019; chest CT April 05, 2020 FINDINGS: Port-A-Cath tip is near  the cavoatrial junction. No pneumothorax. No edema or airspace opacity. Heart is upper normal in size with pulmonary vascularity normal. No adenopathy is appreciable by radiography. No bone lesions. IMPRESSION: Port-A-Cath tip near cavoatrial junction. No edema or airspace opacity. Heart upper normal in size. No adenopathy is appreciable by radiography; left infrahilar lymph node noted on prior CT not evident on current radiographic examination. Electronically Signed   By: Lowella Grip III M.D.   On: 06/25/2020 10:56     ASSESSMENT/PLAN:  This is a very pleasant 65 year old African-American male diagnosed with stage IV (Tx, N2, M1c)non-small cell lung cancer, adenocarcinoma.Hepresented with a solitary brain metastasis in addition to right hilar and mediastinal lymphadenopathy. Hewas diagnosed in August 2021.   The patient is status post right craniotomy with resection of the solitary brain metastasis with SRS under the care of Dr. Lisbeth Renshaw and Dr. Christella Noa.  He completed weekly concurrent chemoradiation with carboplatin for an AUC of 2 and paclitaxel 45 mg per metered square.  He is status post 5 cycles.  He tolerated it well except for some dysphagia/odynophagia.  The patient then had evidence of new brain metastases.  He is status post SRS to the new lesions on 04/22/2020 under the care of Dr. Lisbeth Renshaw.  The patient is currently undergoing immunotherapy with Libtayo IV every 3 weeks.  He is status post 3 cycles and tolerated it well  The patient recently had a restaging CT scan performed. Dr. Julien Nordmann personally and independently reviewed the scan and discussed the results with the patient. The scan showed continued positive response to therapy. Mediastinal and left infrahilar adenopathy is again decreased. No findings in the chest highly suspicious for new or progressive metastatic disease.  Dr. Julien Nordmann recommends that the patient continue on the same treatment at the same dose. He will  proceed with cycle #4 today as scheduled.   We will see him back for a follow up visit in 3 weeks for evaluation before starting cycle #5.   He was given a copy of his calendar. He was advised to please keep the appointment for his brain MRI as scheduled as he has a follow up with Dr. Mickeal Skinner on 07/26/20.   Regarding his arthralgias, Dr. Julien Nordmann discussed this may be secondary to his immunotherapy. Discussed that steroids may decrease the effectiveness of his immunotherapy. Therefore, Dr. Julien Nordmann would recommend extra strenght tylenol and NSAIDs periodically if needed for joint pain.   The patient was advised to call immediately if he has any concerning symptoms in the interval. The patient voices understanding of current disease status and treatment options and is in agreement with the current care plan. All questions were answered. The patient knows to call the clinic with any problems, questions or concerns. We can certainly see the patient much sooner if necessary.   No orders of the defined types were placed in this encounter.    I spent 20-29 minutes in this encounter.   Shye Doty L Lihanna Biever, PA-C 07/14/20  ADDENDUM: Hematology/Oncology Attending: I had a face-to-face encounter with the patient today.  I reviewed his record, scan and recommended his care plan.  This is a very pleasant 65 years old African-American male diagnosed with stage IV non-small cell lung cancer, adenocarcinoma with no actionable mutations and PD-L1 expression of 70% diagnosed in August 2021.  The patient underwent a course of concurrent chemoradiation with weekly carboplatin and paclitaxel in addition to [facility] to solitary brain metastasis. He is currently undergoing systemic treatment with immunotherapy with Libtayo (Cempilimab) 350 mg IV every 3 weeks status post 3 cycles.  The patient has been tolerating this treatment well with no concerning complaints except for arthralgia.  Has been using Tylenol and  ibuprofen with mild improvement. The patient had repeat CT scan of the chest, abdomen pelvis performed recently.  I personally and independently reviewed the scan and discussed the results with the patient and his sister. Has a scan showed further improvement of his disease and no concerning findings for progression. I recommended for the patient to proceed with cycle #4 today as planned. He will come back for follow-up visit in 3 weeks for evaluation before the next cycle of his treatment. For the arthralgia, he will continue his current treatment with Tylenol Extra Strength and ibuprofen as needed. He was advised to call immediately if he has any other concerning symptoms in the interval.  Disclaimer: This note was dictated with voice recognition software. Similar sounding words can inadvertently be transcribed and may be missed upon review. The total time spent in the appointment was 35 minutes. Eilleen Kempf, MD 07/18/20

## 2020-07-15 ENCOUNTER — Other Ambulatory Visit: Payer: Self-pay

## 2020-07-15 ENCOUNTER — Telehealth: Payer: Self-pay

## 2020-07-15 DIAGNOSIS — C7931 Secondary malignant neoplasm of brain: Secondary | ICD-10-CM

## 2020-07-15 NOTE — Telephone Encounter (Signed)
I have also called the pt directly and provided his appt information but requested he alert his sisters to check their voicemail as well so they are aware of this appt. Pt stated he would let them know.

## 2020-07-15 NOTE — Telephone Encounter (Signed)
Pts sister, Ivin Booty, shared pt is scheduled for his brain MRI on 3/11 at [facility] but they indicated they cannot do it there with the [facility] protochol and the order would need to be redone so he can have it done at [facility].  I have re-entered the MRI order with [facility] protochol and contacted Radiology Scheduling and had the pt rescheduled at [facility]. There is no availability prior to pts follow-up appt with Dr. Mickeal Skinner on 3/15 except for Saturday 3/12 at 10:30am. I have scheduled this appt and attempted to call both of the pts sisters, Ivin Booty and [place]. Both of their contact numbers went straight to voicemail. I left a detailed message with the appt date/time/location as follows:  Saturday July 23, 2020 at 10:30am [facility] in the ER (because it's a Saturday)  I also advised in my message this is the only date available that will allow for the pt to still see Dr. Mickeal Skinner for results on 07/26/20.

## 2020-07-18 ENCOUNTER — Inpatient Hospital Stay: Payer: Medicare HMO

## 2020-07-18 ENCOUNTER — Other Ambulatory Visit: Payer: Self-pay

## 2020-07-18 ENCOUNTER — Telehealth: Payer: Self-pay

## 2020-07-18 ENCOUNTER — Inpatient Hospital Stay (HOSPITAL_BASED_OUTPATIENT_CLINIC_OR_DEPARTMENT_OTHER): Payer: Medicare HMO | Admitting: Physician Assistant

## 2020-07-18 ENCOUNTER — Inpatient Hospital Stay: Payer: Medicare HMO | Attending: Physician Assistant

## 2020-07-18 VITALS — BP 113/70 | HR 96 | Temp 97.9°F | Resp 15 | Ht 73.0 in | Wt 182.9 lb

## 2020-07-18 DIAGNOSIS — Z8669 Personal history of other diseases of the nervous system and sense organs: Secondary | ICD-10-CM | POA: Diagnosis not present

## 2020-07-18 DIAGNOSIS — M25511 Pain in right shoulder: Secondary | ICD-10-CM | POA: Diagnosis not present

## 2020-07-18 DIAGNOSIS — M2550 Pain in unspecified joint: Secondary | ICD-10-CM | POA: Diagnosis not present

## 2020-07-18 DIAGNOSIS — M25512 Pain in left shoulder: Secondary | ICD-10-CM | POA: Diagnosis not present

## 2020-07-18 DIAGNOSIS — M47814 Spondylosis without myelopathy or radiculopathy, thoracic region: Secondary | ICD-10-CM | POA: Insufficient documentation

## 2020-07-18 DIAGNOSIS — Z79899 Other long term (current) drug therapy: Secondary | ICD-10-CM | POA: Insufficient documentation

## 2020-07-18 DIAGNOSIS — C3491 Malignant neoplasm of unspecified part of right bronchus or lung: Secondary | ICD-10-CM

## 2020-07-18 DIAGNOSIS — R51 Headache with orthostatic component, not elsewhere classified: Secondary | ICD-10-CM | POA: Insufficient documentation

## 2020-07-18 DIAGNOSIS — R0609 Other forms of dyspnea: Secondary | ICD-10-CM | POA: Diagnosis not present

## 2020-07-18 DIAGNOSIS — E041 Nontoxic single thyroid nodule: Secondary | ICD-10-CM | POA: Insufficient documentation

## 2020-07-18 DIAGNOSIS — C799 Secondary malignant neoplasm of unspecified site: Secondary | ICD-10-CM

## 2020-07-18 DIAGNOSIS — Z791 Long term (current) use of non-steroidal anti-inflammatories (NSAID): Secondary | ICD-10-CM | POA: Diagnosis not present

## 2020-07-18 DIAGNOSIS — Z5111 Encounter for antineoplastic chemotherapy: Secondary | ICD-10-CM | POA: Diagnosis present

## 2020-07-18 DIAGNOSIS — C7931 Secondary malignant neoplasm of brain: Secondary | ICD-10-CM | POA: Insufficient documentation

## 2020-07-18 DIAGNOSIS — E119 Type 2 diabetes mellitus without complications: Secondary | ICD-10-CM | POA: Diagnosis not present

## 2020-07-18 DIAGNOSIS — Z87891 Personal history of nicotine dependence: Secondary | ICD-10-CM | POA: Insufficient documentation

## 2020-07-18 DIAGNOSIS — I10 Essential (primary) hypertension: Secondary | ICD-10-CM | POA: Insufficient documentation

## 2020-07-18 DIAGNOSIS — Z5112 Encounter for antineoplastic immunotherapy: Secondary | ICD-10-CM

## 2020-07-18 LAB — CMP (CANCER CENTER ONLY)
ALT: 35 U/L (ref 0–44)
AST: 25 U/L (ref 15–41)
Albumin: 4.2 g/dL (ref 3.5–5.0)
Alkaline Phosphatase: 108 U/L (ref 38–126)
Anion gap: 7 (ref 5–15)
BUN: 20 mg/dL (ref 8–23)
CO2: 24 mmol/L (ref 22–32)
Calcium: 9.3 mg/dL (ref 8.9–10.3)
Chloride: 108 mmol/L (ref 98–111)
Creatinine: 0.86 mg/dL (ref 0.61–1.24)
GFR, Estimated: >60 mL/min (ref >60)
Glucose, Bld: 97 mg/dL (ref 70–99)
Potassium: 4.4 mmol/L (ref 3.5–5.1)
Sodium: 139 mmol/L (ref 135–145)
Total Bilirubin: 0.5 mg/dL (ref 0.3–1.2)
Total Protein: 8.3 g/dL — ABNORMAL HIGH (ref 6.5–8.1)

## 2020-07-18 LAB — CBC WITH DIFFERENTIAL (CANCER CENTER ONLY)
Abs Immature Granulocytes: 0.02 10*3/uL (ref 0.00–0.07)
Basophils Absolute: 0 10*3/uL (ref 0.0–0.1)
Basophils Relative: 1 %
Eosinophils Absolute: 0.1 10*3/uL (ref 0.0–0.5)
Eosinophils Relative: 1 %
HCT: 39.5 % (ref 39.0–52.0)
Hemoglobin: 13.2 g/dL (ref 13.0–17.0)
Immature Granulocytes: 0 %
Lymphocytes Relative: 23 %
Lymphs Abs: 1.3 10*3/uL (ref 0.7–4.0)
MCH: 29.5 pg (ref 26.0–34.0)
MCHC: 33.4 g/dL (ref 30.0–36.0)
MCV: 88.2 fL (ref 80.0–100.0)
Monocytes Absolute: 0.7 10*3/uL (ref 0.1–1.0)
Monocytes Relative: 12 %
Neutro Abs: 3.6 10*3/uL (ref 1.7–7.7)
Neutrophils Relative %: 63 %
Platelet Count: 351 10*3/uL (ref 150–400)
RBC: 4.48 MIL/uL (ref 4.22–5.81)
RDW: 14.3 % (ref 11.5–15.5)
WBC Count: 5.6 10*3/uL (ref 4.0–10.5)
nRBC: 0 % (ref 0.0–0.2)

## 2020-07-18 LAB — TSH: TSH: 1.169 u[IU]/mL (ref 0.320–4.118)

## 2020-07-18 MED ORDER — SODIUM CHLORIDE 0.9 % IV SOLN
Freq: Once | INTRAVENOUS | Status: AC
  Filled 2020-07-18: qty 250

## 2020-07-18 MED ORDER — HEPARIN SOD (PORK) LOCK FLUSH 100 UNIT/ML IV SOLN
500.0000 [IU] | Freq: Once | INTRAVENOUS | Status: AC | PRN
  Administered 2020-07-18: 500 [IU]
  Filled 2020-07-18: qty 5

## 2020-07-18 MED ORDER — SODIUM CHLORIDE 0.9 % IV SOLN
350.0000 mg | Freq: Once | INTRAVENOUS | Status: AC
  Administered 2020-07-18: 350 mg via INTRAVENOUS
  Filled 2020-07-18: qty 7

## 2020-07-18 MED ORDER — SODIUM CHLORIDE 0.9% FLUSH
10.0000 mL | INTRAVENOUS | Status: DC | PRN
  Administered 2020-07-18: 10 mL
  Filled 2020-07-18: qty 10

## 2020-07-18 NOTE — Patient Instructions (Signed)
  [facility] Discharge Instructions for Patients Receiving Chemotherapy  Today you received the following chemotherapy agents Cemipilmab (Libtayo)  To help prevent nausea and vomiting after your treatment, we encourage you to take your nausea medication as directed.   If you develop nausea and vomiting that is not controlled by your nausea medication, call the clinic.   BELOW ARE SYMPTOMS THAT SHOULD BE REPORTED IMMEDIATELY:  *FEVER GREATER THAN 100.5 F  *CHILLS WITH OR WITHOUT FEVER  NAUSEA AND VOMITING THAT IS NOT CONTROLLED WITH YOUR NAUSEA MEDICATION  *UNUSUAL SHORTNESS OF BREATH  *UNUSUAL BRUISING OR BLEEDING  TENDERNESS IN MOUTH AND THROAT WITH OR WITHOUT PRESENCE OF ULCERS  *URINARY PROBLEMS  *BOWEL PROBLEMS  UNUSUAL RASH Items with * indicate a potential emergency and should be followed up as soon as possible.  Feel free to call the clinic should you have any questions or concerns. The clinic phone number is [phone].  Please show the [facility] at check-in to the Emergency Department and triage nurse.

## 2020-07-18 NOTE — Telephone Encounter (Signed)
I called pts sisters, Janae Bridgeman and Ivin Booty, to remind them of pts appt today 07/18/20 and left messages for them both. I then called the pt and reminded him. I also confirmed his sister, Janae Bridgeman, is supposed to be with him for this appt.

## 2020-07-19 ENCOUNTER — Telehealth: Payer: Self-pay | Admitting: Physician Assistant

## 2020-07-19 NOTE — Telephone Encounter (Signed)
Scheduled per los. Called and left msg. Mailed printout  °

## 2020-07-20 NOTE — Addendum Note (Signed)
Addended by: Hulan Fray on: 07/20/2020 03:01 PM   Modules accepted: Orders

## 2020-07-22 ENCOUNTER — Other Ambulatory Visit: Payer: Medicare HMO

## 2020-07-23 ENCOUNTER — Other Ambulatory Visit: Payer: Self-pay

## 2020-07-23 ENCOUNTER — Ambulatory Visit (HOSPITAL_COMMUNITY)
Admission: RE | Admit: 2020-07-23 | Discharge: 2020-07-23 | Disposition: A | Discharge status: Home / Self Care | Payer: Medicare HMO | Source: Ambulatory Visit | Attending: Internal Medicine | Admitting: Internal Medicine

## 2020-07-23 ENCOUNTER — Ambulatory Visit (HOSPITAL_COMMUNITY): Payer: Medicare HMO

## 2020-07-23 DIAGNOSIS — C7931 Secondary malignant neoplasm of brain: Secondary | ICD-10-CM | POA: Diagnosis present

## 2020-07-23 MED ORDER — GADOBUTROL 1 MMOL/ML IV SOLN
9.0000 mL | Freq: Once | INTRAVENOUS | Status: AC | PRN
  Administered 2020-07-23: 9 mL via INTRAVENOUS

## 2020-07-26 ENCOUNTER — Inpatient Hospital Stay (HOSPITAL_BASED_OUTPATIENT_CLINIC_OR_DEPARTMENT_OTHER): Payer: Medicare HMO | Admitting: Internal Medicine

## 2020-07-26 ENCOUNTER — Other Ambulatory Visit: Payer: Self-pay

## 2020-07-26 VITALS — BP 116/73 | HR 92 | Temp 97.8°F | Resp 14 | Ht 73.0 in | Wt 189.1 lb

## 2020-07-26 DIAGNOSIS — Z5111 Encounter for antineoplastic chemotherapy: Secondary | ICD-10-CM | POA: Diagnosis not present

## 2020-07-26 DIAGNOSIS — C7931 Secondary malignant neoplasm of brain: Secondary | ICD-10-CM

## 2020-07-26 NOTE — Progress Notes (Signed)
[facility] at [place], [place] [address] [phone]  Interval Evaluation  Date of Service: 07/26/20 Patient Name: Jared Shea. Patient MRN: [id number] Patient DOB: 03/21/1956 Provider: Ventura Sellers, MD  Identifying Statement:  Jared Paredez. is a 65 y.o. male with Brain metastases [facility]) [C79.31]   Primary Cancer:  Oncologic History: Oncology History  Non-small cell carcinoma of lung, stage 4 w/ mets to brain ([place])  12/31/2019 Initial Diagnosis   Non-small cell carcinoma of lung, stage 4 w/ mets to brain ([place])   06/06/2020 Cancer Staging   Staging form: Lung, AJCC 8th Edition - Clinical: Stage IVB (cT0, cN2, cM1c) - Signed by Curt Bears, MD on 06/06/2020   Adenocarcinoma, metastatic ([place])  01/07/2020 Initial Diagnosis   Adenocarcinoma, metastatic ([place])   02/01/2020 - 03/07/2020 Chemotherapy   The patient had dexamethasone (DECADRON) 4 MG tablet, 8 mg, Oral, Daily, 1 of 1 cycle, Start date: --, End date: -- palonosetron (ALOXI) injection 0.25 mg, 0.25 mg, Intravenous,  Once, 5 of 7 cycles Administration: 0.25 mg (02/01/2020), 0.25 mg (02/08/2020), 0.25 mg (03/07/2020), 0.25 mg (02/15/2020), 0.25 mg (02/22/2020) CARBOplatin (PARAPLATIN) 220 mg in sodium chloride 0.9 % 250 mL chemo infusion, 220 mg (100 % of original dose 216.8 mg), Intravenous,  Once, 5 of 7 cycles Dose modification: 216.8 mg (original dose 216.8 mg, Cycle 1) Administration: 220 mg (02/01/2020), 220 mg (02/08/2020), 220 mg (03/07/2020), 220 mg (02/15/2020), 220 mg (02/22/2020) PACLitaxel (TAXOL) 90 mg in sodium chloride 0.9 % 250 mL chemo infusion (</= 80mg /m2), 45 mg/m2 = 90 mg, Intravenous,  Once, 5 of 7 cycles Administration: 90 mg (02/01/2020), 90 mg (02/08/2020), 90 mg (03/07/2020), 90 mg (02/15/2020), 90 mg (02/22/2020)  for chemotherapy treatment.    04/29/2020 -  Chemotherapy    Patient is on Treatment Plan: MELANOMA CEMIPLIMAB Q21D       CNS  Oncologic History 01/05/20: Pre-op SRS [facility]beth Renshaw) 01/07/20: Craniotomy, R frontal resection ([place]) 04/22/20: Salvage SRS to 3 targets   Interval History:  Jared Shea. presents today for follow up after brain MRI.  No new or progressive neurologic deficits today.  Jared Shea does complain of ongoing joint pain, affecting knees, shoulders, elbows bilaterally; all since starting on Cemiplimab infusions.  Currently undergoing this immunotherapy treatment with Dr. Julien Nordmann since 04/29/20.  Headaches have been more sporadic since starting Pamelor.   H+P (04/18/20) Patient presents following recent brain MRI.  Jared Shea had completed pre-op SRS and underwent craniotomy for R frontal metastasis, after initial presenting with focal seizures (facial twitching).  Jared Shea denies focal neurologic complaints today, Jared Shea is functional and independent.  Jared Shea is undergoing chemotherapy with Dr. Julien Nordmann (completed [place] and taxol) without complication.  Jared Shea gait is unassisted.  Denies any seizures following Jared Shea initial presentation, compliant with Keppra.      Medications: Current Outpatient Medications on File Prior to Visit  Medication Sig Dispense Refill  . levETIRAcetam (KEPPRA) 750 MG tablet Take 1 tablet (750 mg total) by mouth 2 (two) times daily. 180 tablet 4  . losartan (COZAAR) 25 MG tablet Take 1 tablet (25 mg total) by mouth daily. 30 tablet 11  . Melatonin 10 MG TABS Take 10 mg by mouth at bedtime as needed (sleep).    Marland Kitchen ibuprofen (ADVIL) 200 MG tablet Take 400 mg by mouth every 6 (six) hours as needed for mild pain. (Patient not taking: No sig reported)    . nortriptyline (PAMELOR) 25 MG capsule Take 2 capsules (  50 mg total) by mouth at bedtime. (Patient not taking: Reported on 07/26/2020) 60 capsule 11  . pseudoephedrine-acetaminophen (TYLENOL SINUS) 30-500 MG TABS tablet Take 1 tablet by mouth every 4 (four) hours as needed (sinus pressure). (Patient not taking: No sig reported)    . SUMAtriptan (IMITREX) 25 MG tablet  Take 1 tablet (25 mg total) by mouth every 2 (two) hours as needed for migraine. May repeat in 2 hours if headache persists or recurs. (Patient not taking: Reported on 07/26/2020) 10 tablet 6   No current facility-administered medications on file prior to visit.    Allergies:  Allergies  Allergen Reactions  . Penicillins Rash    Did it involve swelling of the face/tongue/throat, SOB, or low BP? N Did it involve sudden or severe rash/hives, skin peeling, or any reaction on the inside of your mouth or nose? Y Did you need to seek medical attention at a [facility] or doctor's office? Y When did it last happen?childhood If all above answers are "NO", may proceed with cephalosporin use.    Past Medical History:  Past Medical History:  Diagnosis Date  . Brain mass   . Bursitis of right hip   . Chest pain 08/12/2018  . Chronic cough   . Dependence on nicotine from cigarettes   . Diabetes mellitus without complication ([place])   . Essential hypertension 07/28/2018  . nscl ca dx'd 12/2019  . Seizures ([place])   . Viral illness 03/23/2019   Past Surgical History:  Past Surgical History:  Procedure Laterality Date  . APPLICATION OF CRANIAL NAVIGATION N/A 01/07/2020   Procedure: APPLICATION OF CRANIAL NAVIGATION;  Surgeon: Ashok Pall, MD;  Location: [facility];  Service: Neurosurgery;  Laterality: N/A;  . CRANIOTOMY Right 01/07/2020   Procedure: RIGHT CRANIOTOMY FOR TUMOR RESECTION;  Surgeon: Ashok Pall, MD;  Location: [facility];  Service: Neurosurgery;  Laterality: Right;  rm 21  . ENDOBRONCHIAL ULTRASOUND N/A 12/21/2019   Procedure: ENDOBRONCHIAL ULTRASOUND;  Surgeon: Laurin Coder, MD;  Location: [facility];  Service: Pulmonary;  Laterality: N/A;  . FINE NEEDLE ASPIRATION  12/21/2019   Procedure: FINE NEEDLE ASPIRATION (FNA) LINEAR;  Surgeon: Laurin Coder, MD;  Location: [facility];  Service: Pulmonary;;  . IR IMAGING GUIDED PORT INSERTION  03/03/2020  . NO PAST SURGERIES    . VIDEO  BRONCHOSCOPY N/A 12/21/2019   Procedure: VIDEO BRONCHOSCOPY WITHOUT FLUORO;  Surgeon: Laurin Coder, MD;  Location: [facility];  Service: Pulmonary;  Laterality: N/A;   Social History:  Social History   Socioeconomic History  . Marital status: Divorced    Spouse name: Not on file  . Number of children: Not on file  . Years of education: Not on file  . Highest education level: Not on file  Occupational History  . Not on file  Tobacco Use  . Smoking status: Former Smoker    Packs/day: 1.00    Years: 48.00    Pack years: 48.00    Types: Cigarettes    Start date: 08/07/1970    Quit date: 12/15/2019    Years since quitting: 0.6  . Smokeless tobacco: Never Used  Vaping Use  . Vaping Use: Never used  Substance and Sexual Activity  . Alcohol use: Not Currently  . Drug use: Never  . Sexual activity: Not on file    Comment: YES  Other Topics Concern  . Not on file  Social History Narrative  . Not on file   Social Determinants of Health  Financial Resource Strain: High Risk  . Difficulty of Paying Living Expenses: Hard  Food Insecurity: No Food Insecurity  . Worried About Charity fundraiser in the Last Year: Never true  . Ran Out of Food in the Last Year: Never true  Transportation Needs: Unmet Transportation Needs  . Lack of Transportation (Medical): Yes  . Lack of Transportation (Non-Medical): Yes  Physical Activity: Not on file  Stress: Not on file  Social Connections: Not on file  Intimate Partner Violence: Not on file   Family History:  Family History  Problem Relation Age of Onset  . Heart disease Mother        CABG  . Kidney disease Sister   . Diabetes Brother   . Kidney disease Brother        KIDNEY TRANSPLANT  . Hypertension Sister   . Healthy Daughter   . Healthy Daughter   . Healthy Son     Review of Systems: Constitutional: Doesn't report fevers, chills or abnormal weight loss Eyes: Doesn't report blurriness of vision Ears, nose, mouth,  throat, and face: Doesn't report sore throat Respiratory: Doesn't report cough, dyspnea or wheezes Cardiovascular: Doesn't report palpitation, chest discomfort  Gastrointestinal:  Doesn't report nausea, constipation, diarrhea GU: Doesn't report incontinence Skin: Doesn't report skin rashes Neurological: Per HPI Musculoskeletal: diffuse joint pain Behavioral/Psych: Doesn't report anxiety  Physical Exam: Vitals:   07/26/20 1101  BP: 116/73  Pulse: 92  Resp: 14  Temp: 97.8 F (36.6 C)  SpO2: 100%   KPS: 80. General: Alert, cooperative, pleasant, in no acute distress Head: Normal EENT: No conjunctival injection or scleral icterus.  Lungs: Resp effort normal Cardiac: Regular rate Abdomen: Non-distended abdomen Skin: No rashes cyanosis or petechiae. Extremities: No clubbing or edema  Neurologic Exam: Mental Status: Awake, alert, attentive to examiner. Oriented to self and environment. Language is fluent with intact comprehension.  Cranial Nerves: Visual acuity is grossly normal. Visual fields are full. Extra-ocular movements intact. No ptosis. Face is symmetric Motor: Tone and bulk are normal. Power is full in both arms and legs. Reflexes are symmetric, no pathologic reflexes present.  Sensory: Intact to light touch Gait: Normal.   Labs: I have reviewed the data as listed    Component Value Date/Time   NA 139 07/18/2020 1415   NA 140 11/23/2019 1516   K 4.4 07/18/2020 1415   CL 108 07/18/2020 1415   CO2 24 07/18/2020 1415   GLUCOSE 97 07/18/2020 1415   BUN 20 07/18/2020 1415   BUN 14 11/23/2019 1516   CREATININE 0.86 07/18/2020 1415   CALCIUM 9.3 07/18/2020 1415   PROT 8.3 (H) 07/18/2020 1415   ALBUMIN 4.2 07/18/2020 1415   AST 25 07/18/2020 1415   ALT 35 07/18/2020 1415   ALKPHOS 108 07/18/2020 1415   BILITOT 0.5 07/18/2020 1415   GFRNONAA >60 07/18/2020 1415   GFRAA >60 02/15/2020 1000   Lab Results  Component Value Date   WBC 5.6 07/18/2020   NEUTROABS 3.6  07/18/2020   HGB 13.2 07/18/2020   HCT 39.5 07/18/2020   MCV 88.2 07/18/2020   PLT 351 07/18/2020    Imaging:  [place] Clinician Interpretation: I have personally reviewed the CNS images as listed.  My interpretation, in the context of the patient's clinical presentation, is stable disease  CT Chest W Contrast  Result Date: 07/06/2020 CLINICAL DATA:  Primary Cancer Type: Lung Imaging Indication: Assess response to therapy Interval therapy since last imaging? Yes Initial Cancer Diagnosis Date: 12/21/2019;  Established by: Biopsy-proven Detailed Pathology: Stage IV non-small cell lung cancer, adenocarcinoma. Primary Tumor location: Mediastinal and left hilar adenopathy. Brain metastasis. Surgeries: No. Chemotherapy: Yes; Ongoing? No; Most recent administration: 02/29/2020 Immunotherapy?  Yes; Type: Cempilimab; Ongoing? Yes Radiation therapy? Yes Date Range: 01/25/2020 - 03/14/2020; Target: Left lung Date Range: 04/22/2020; Target: Brain EXAM: CT CHEST WITH CONTRAST TECHNIQUE: Multidetector CT imaging of the chest was performed during intravenous contrast administration. CONTRAST:  81mL OMNIPAQUE IOHEXOL 300 MG/ML  SOLN COMPARISON:  Most recent CT chest 04/05/2020.  01/01/2020 PET-CT. FINDINGS: Cardiovascular: Normal heart size. No significant pericardial effusion/thickening. Left anterior descending and left circumflex coronary atherosclerosis. Right internal jugular Port-A-Cath terminates at the cavoatrial junction. Great vessels are normal in course and caliber. No central pulmonary emboli. Mediastinum/Nodes: Stable hypodense 1.1 cm right thyroid nodule, previously evaluated by ultrasound. This has been evaluated on previous imaging. (ref: J Am Coll Radiol. 2015 Feb;12(2): 143-50). Unremarkable esophagus. No axillary adenopathy. Mildly enlarged 1.1 cm subcarinal node (series 2/image 78), decreased from 2.0 cm. Previously mildly enlarged 1.2 cm low left paratracheal node is decreased to 0.9 cm (series  2/image 64). Previously mildly enlarged 1.4 cm left infrahilar node is decreased to 0.9 cm (series 2/image 88). No newly enlarged mediastinal or hilar nodes. Lungs/Pleura: No pneumothorax. No pleural effusion. Sharply marginated patchy paramediastinal consolidation in the left greater than right lungs is new and probably representing early postradiation change. A few indistinct small nodular opacities scattered in the periphery of both lungs, largest 6 mm in the anterior left upper lobe (series 7/image 50) and 4 mm in the left lower lobe (series 7/image 100) are new. Upper abdomen: No acute abnormality. Musculoskeletal: No aggressive appearing focal osseous lesions. Mild thoracic spondylosis. IMPRESSION: 1. Continued positive response to therapy. Mediastinal and left infrahilar adenopathy is again decreased. No findings in the chest highly suspicious for new or progressive metastatic disease. 2. Sharply marginated patchy paramediastinal consolidation in the left greater than right lungs, new, compatible with early postradiation change. 3. A few indistinct small nodular opacities scattered in the periphery of both lungs, largest 6 mm in the anterior left upper lobe, indeterminate, favor treatment related inflammatory opacities. Attention on follow-up chest CT recommended in 3 months. 4. Two-vessel coronary atherosclerosis. Electronically Signed   By: Ilona Sorrel M.D.   On: 07/06/2020 10:38   MR Brain W Wo Contrast  Result Date: 07/24/2020 CLINICAL DATA:  Assess treatment response. Metastatic small cell lung cancer EXAM: MRI HEAD WITHOUT AND WITH CONTRAST TECHNIQUE: Multiplanar, multiecho pulse sequences of the brain and surrounding structures were obtained without and with intravenous contrast. CONTRAST:  65mL GADAVIST GADOBUTROL 1 MMOL/ML IV SOLN COMPARISON:  04/14/2020 FINDINGS: BRAIN New Lesions: None. Larger lesions: None. Stable or Smaller lesions: Punctate enhancement around the occipital horn of the right  lateral ventricle seen on 1100:175 Stable chronic blood products associated with the right frontal craniotomy site. Other Brain findings: No evidence of acute infarct, hemorrhage, hydrocephalus, or extra-axial collection. Vascular: Normal flow voids. Developmental venous anomaly in the right posterior frontal region. Skull and upper cervical spine: No bony metastasis noted Sinuses/Orbits: Chronic mucosal thickening focally in the left sphenoid sinus. Negative orbits. IMPRESSION: No new or progressive metastatic disease. Electronically Signed   By: Monte Fantasia M.D.   On: 07/24/2020 12:04    Assessment/Plan Brain metastases ([place]) [C79.31]  Jared Simmonds. is clinically and radiographically stable today.  Headaches are well controlled with Pamelor.  We appreciate the opportunity to participate in the care of Jared Shea  Jared Colonel..    We ask that Jared Simmonds. return to clinic in 3 months following next brain MRI, or sooner as needed.  All questions were answered. The patient knows to call the clinic with any problems, questions or concerns. No barriers to learning were detected.  The total time spent in the encounter was 30 minutes and more than 50% was on counseling and review of test results   Ventura Sellers, MD Medical Director of Neuro-Oncology [facility] [place] 07/26/20 11:20 AM

## 2020-07-27 ENCOUNTER — Telehealth: Payer: Self-pay

## 2020-07-27 NOTE — Telephone Encounter (Signed)
Pts sister, Jared Shea, called stating the pt is c/o joint pain and may need to be reminded of what he is supposed to do. She requested we call him to provide this reminder.  I have called and spoke with the pt how confirms he has joint pain and really wants to be placed on steroids. I reminded the pt he cannot be placed on contiuous steroid because it hinders the effectiveness of his immunotherapy. I asked the pt if he has been taking the recommended Tylenol and Motrin and he says he has not be taking anything for 2 or more days. I reminded the pt of the recommendations given to him at his 07/18/20 appt. Pt states he has now written this down and will go purchase the medication today.  I have updated his sister, Jared Shea, about my conversation with the pt.

## 2020-08-01 ENCOUNTER — Telehealth: Payer: Self-pay | Admitting: Internal Medicine

## 2020-08-01 NOTE — Telephone Encounter (Signed)
Scheduled per los. Called and left msg. Mailed printout  °

## 2020-08-05 ENCOUNTER — Telehealth: Payer: Self-pay

## 2020-08-05 ENCOUNTER — Other Ambulatory Visit: Payer: Self-pay | Admitting: Internal Medicine

## 2020-08-05 NOTE — Telephone Encounter (Signed)
Tylenol #3 no longer on current med list. Last written by oncology. Will also route to oncology as pain may be 2/2 to chemo.

## 2020-08-05 NOTE — Progress Notes (Signed)
[place] OFFICE PROGRESS NOTE  Jean Rosenthal, MD [address] [place] [address]  DIAGNOSIS: Stage IV (TX, N2, M1 C) non-small cell lung cancer, adenocarcinoma diagnosed in August 2021 and presented with solitary brain metastasis in addition to mediastinal lymphadenopathy.  PDL1 Expression 70%  Molecular Biomarkers: No actionable mutations  PRIOR THERAPY: 1)Status post right craniotomy with tumor resection followed by [facility] to solitary brain metastasis under the care of Dr. Lisbeth Renshaw and Dr. Sherral Hammers. 2)Concurrent chemoradiation with weekly carboplatin for AUC of 2 and paclitaxel 45 mg/M2. First dose 02/01/2020.Status post5cycles.Last dose was given February 29, 2020. 3) SRS to the new metastatic brain lesions under the care of Dr. Lisbeth Renshaw. Completed on 04/22/20.  CURRENT THERAPY: First-line treatment with immunotherapy with Libtayo (Cempilimab) 350 mg IV every 3 weeks. First dose April 29, 2020. Status post 5 cycles.   INTERVAL HISTORY: Jared Shea. 65 y.o. male returns to the clinic today for a follow up visit accompanied by a family member. The patient is feeling fair today without any concerning complaints except for polyarthralgia. This was discussed at his last appointment. We discussed that the immunotherapy can exacerbate arthralgias. He was instructed to take tylenol and/or motrin if needed which he has been doing sporadically. He had several questions on instructions how to take these medications. Also in the interval, she saw neuro-oncology and had a repeat brain MRI which was negative for recurrence. Otherwise, besides the joint pain, he is feeling well. The patient denies recent fevers, chills, night sweats, or weight loss.  The patient states he has dyspnea with exertion and a mild cough "every now and then".  He does not take any medication for his cough.He denies any nausea, vomiting, diarrhea, or constipation. He denies any rashes or skin  changes. He denies any headaches or visual changes since being placed on medications for his headaches. He is here today for evaluation before starting cycle #6.   MEDICAL HISTORY: Past Medical History:  Diagnosis Date  . Brain mass   . Bursitis of right hip   . Chest pain 08/12/2018  . Chronic cough   . Dependence on nicotine from cigarettes   . Diabetes mellitus without complication ([place])   . Essential hypertension 07/28/2018  . nscl ca dx'd 12/2019  . Seizures ([place])   . Viral illness 03/23/2019    ALLERGIES:  is allergic to penicillins.  MEDICATIONS:  Current Outpatient Medications  Medication Sig Dispense Refill  . ibuprofen (ADVIL) 200 MG tablet Take 400 mg by mouth every 6 (six) hours as needed for mild pain. (Patient not taking: No sig reported)    . levETIRAcetam (KEPPRA) 750 MG tablet Take 1 tablet (750 mg total) by mouth 2 (two) times daily. 180 tablet 4  . losartan (COZAAR) 25 MG tablet Take 1 tablet (25 mg total) by mouth daily. 30 tablet 11  . Melatonin 10 MG TABS Take 10 mg by mouth at bedtime as needed (sleep).    . nortriptyline (PAMELOR) 25 MG capsule Take 2 capsules (50 mg total) by mouth at bedtime. (Patient not taking: Reported on 07/26/2020) 60 capsule 11  . pseudoephedrine-acetaminophen (TYLENOL SINUS) 30-500 MG TABS tablet Take 1 tablet by mouth every 4 (four) hours as needed (sinus pressure). (Patient not taking: No sig reported)    . SUMAtriptan (IMITREX) 25 MG tablet Take 1 tablet (25 mg total) by mouth every 2 (two) hours as needed for migraine. May repeat in 2 hours if headache persists or  recurs. (Patient not taking: Reported on 07/26/2020) 10 tablet 6   No current facility-administered medications for this visit.    SURGICAL HISTORY:  Past Surgical History:  Procedure Laterality Date  . APPLICATION OF CRANIAL NAVIGATION N/A 01/07/2020   Procedure: APPLICATION OF CRANIAL NAVIGATION;  Surgeon: Ashok Pall, MD;  Location: [place];  Service: Neurosurgery;   Laterality: N/A;  . CRANIOTOMY Right 01/07/2020   Procedure: RIGHT CRANIOTOMY FOR TUMOR RESECTION;  Surgeon: Ashok Pall, MD;  Location: [place];  Service: Neurosurgery;  Laterality: Right;  rm 21  . ENDOBRONCHIAL ULTRASOUND N/A 12/21/2019   Procedure: ENDOBRONCHIAL ULTRASOUND;  Surgeon: Laurin Coder, MD;  [facility];  Service: Pulmonary;  Laterality: N/A;  . FINE NEEDLE ASPIRATION  12/21/2019   Procedure: FINE NEEDLE ASPIRATION (FNA) LINEAR;  Surgeon: Laurin Coder, MD;  [facility];  Service: Pulmonary;;  . IR IMAGING GUIDED PORT INSERTION  03/03/2020  . NO PAST SURGERIES    . VIDEO BRONCHOSCOPY N/A 12/21/2019   Procedure: VIDEO BRONCHOSCOPY WITHOUT FLUORO;  Surgeon: Laurin Coder, MD;  [facility];  Service: Pulmonary;  Laterality: N/A;    REVIEW OF SYSTEMS:   Review of Systems  Constitutional: Negative for appetite change, chills, fatigue, fever and unexpected weight change.  HENT: Negative for mouth sores, nosebleeds, sore throat and trouble swallowing.   Eyes: Negative for eye problems and icterus.  Respiratory: Positive for occasional shortness of breath and mild cough. Negative for hemoptysis and wheezing.   Cardiovascular: Negative for chest pain and leg swelling.  Gastrointestinal: Negative for abdominal pain, constipation, diarrhea, nausea and vomiting.  Genitourinary: Negative for bladder incontinence, difficulty urinating, dysuria, frequency and hematuria.   Musculoskeletal: Positive for arthralgias. Negative for back pain, gait problem, neck pain and neck stiffness.  Skin: Negative for itching and rash.  Neurological:  Negative for dizziness, headaches, extremity weakness, gait problem, headaches, light-headedness and seizures.  Hematological: Negative for adenopathy. Does not bruise/bleed easily.  Psychiatric/Behavioral: Negative for confusion, depression and sleep disturbance. The patient is not nervous/anxious.   PHYSICAL  EXAMINATION:  There were no vitals taken for this visit.  ECOG PERFORMANCE STATUS: 1 - Symptomatic but completely ambulatory  Physical Exam  Constitutional: Oriented to person, place, and time and well-developed, well-nourished, and in no distress. HENT:  Head: Normocephalic and atraumatic.  Mouth/Throat: Oropharynx is clear and moist. No oropharyngeal exudate.  Eyes: Conjunctivae are normal. Right eye exhibits no discharge. Left eye exhibits no discharge. No scleral icterus.  Neck: Normal range of motion. Neck supple.  Cardiovascular: Normal rate, regular rhythm, normal heart sounds and intact distal pulses.   Pulmonary/Chest: Effort normal and breath sounds normal. No respiratory distress. No wheezes. No rales.  Abdominal: Soft. Bowel sounds are normal. Exhibits no distension and no mass. There is no tenderness.  Musculoskeletal: Normal range of motion. Exhibits no edema.  Lymphadenopathy:    No cervical adenopathy.  Neurological: Alert and oriented to person, place, and time. Exhibits normal muscle tone. Gait normal. Coordination normal.  Skin: Skin is warm and dry. No rash noted. Not diaphoretic. No erythema. No pallor.  Psychiatric: Mood, memory and judgment normal.  Vitals reviewed.  LABORATORY DATA: Lab Results  Component Value Date   WBC 5.6 07/18/2020   HGB 13.2 07/18/2020   HCT 39.5 07/18/2020   MCV 88.2 07/18/2020   PLT 351 07/18/2020      Chemistry      Component Value Date/Time   NA 139 07/18/2020 1415   NA 140 11/23/2019  1516   K 4.4 07/18/2020 1415   CL 108 07/18/2020 1415   CO2 24 07/18/2020 1415   BUN 20 07/18/2020 1415   BUN 14 11/23/2019 1516   CREATININE 0.86 07/18/2020 1415      Component Value Date/Time   CALCIUM 9.3 07/18/2020 1415   ALKPHOS 108 07/18/2020 1415   AST 25 07/18/2020 1415   ALT 35 07/18/2020 1415   BILITOT 0.5 07/18/2020 1415       RADIOGRAPHIC STUDIES:  MR Brain W Wo Contrast  Result Date: 07/24/2020 CLINICAL DATA:   Assess treatment response. Metastatic small cell lung cancer EXAM: MRI HEAD WITHOUT AND WITH CONTRAST TECHNIQUE: Multiplanar, multiecho pulse sequences of the brain and surrounding structures were obtained without and with intravenous contrast. CONTRAST:  93m GADAVIST GADOBUTROL 1 MMOL/ML IV SOLN COMPARISON:  04/14/2020 FINDINGS: BRAIN New Lesions: None. Larger lesions: None. Stable or Smaller lesions: Punctate enhancement around the occipital horn of the right lateral ventricle seen on 1100:175 Stable chronic blood products associated with the right frontal craniotomy site. Other Brain findings: No evidence of acute infarct, hemorrhage, hydrocephalus, or extra-axial collection. Vascular: Normal flow voids. Developmental venous anomaly in the right posterior frontal region. Skull and upper cervical spine: No bony metastasis noted Sinuses/Orbits: Chronic mucosal thickening focally in the left sphenoid sinus. Negative orbits. IMPRESSION: No new or progressive metastatic disease. Electronically Signed   By: JMonte FantasiaM.D.   On: 07/24/2020 12:04     ASSESSMENT/PLAN:  This is a very pleasant 65year old African-American male diagnosed with stage IV (Tx, N2, M1c)non-small cell lung cancer, adenocarcinoma.Hepresented with a solitary brain metastasis in addition to right hilar and mediastinal lymphadenopathy. Hewas diagnosed in August 2021.   The patient is status post right craniotomy with resection of the solitary brain metastasis with SRS under the care of Dr. MLisbeth Renshawand Dr. CChristella Noa  He completed weekly concurrent chemoradiation with carboplatin for an AUC of 2 and paclitaxel 45 mg per metered square. He is status post 5cycles. He tolerated it well except for some dysphagia/odynophagia.  The patient then had evidence of new brain metastases. He is status post SRS to the new lesions on 04/22/2020 under the care of Dr. MLisbeth Renshaw  The patient is currently undergoing immunotherapy with Libtayo IV  every 3 weeks. He is status post 5 cycles and tolerated it well  Labs were reviewed. Recommend that he proceed with cycle #6 today as scheduled.   We will see him back for a follow up visit in 3 weeks for evaluation before starting cycle #7.   His restaging CT scan schedule is off. He will be due for another restaging CT scan after cycle #7.   Regarding his joint pain, the patient continues to request steroids. A schedule for alternating tylenol and motrin if needed was discussed at length at his appointment. He and his daughter had several questions regarding this. He really wanted to take steroids. I discussed his last CT scan showed a positive response to treatment and taking steroids may decrease the effectiveness of his immunotherapy. Therefore, Dr. MJulien Nordmannwould recommend extra strenght tylenol and NSAIDs periodically if needed for joint pain. If his arthralgias are not tolerable, then his only other option for treatment would be changing his treatment to chemotherapy.   The patient was advised to call immediately if she has any concerning symptoms in the interval. The patient voices understanding of current disease status and treatment options and is in agreement with the current care plan. All questions were answered.  The patient knows to call the clinic with any problems, questions or concerns. We can certainly see the patient much sooner if necessary   No orders of the defined types were placed in this encounter.    I spent 20-29 minutes in this encounter.   Larry Knipp L Otillia Cordone, PA-C 08/05/20

## 2020-08-05 NOTE — Telephone Encounter (Signed)
Rec'd call from the pt's sister care giver Ethelle Lyon) [phone] in reference to the patient's medication.  Pt complaining that the medications listed below are not working and he is pain    ibuprofen (ADVIL) 200 MG tablet  acetaminophen-codeine (TYLENOL #3) 300-30 MG tablet [[id number]]   [id number] - [place], [address]  [place], [place] [address]  Phone:  [phone] Fax:  [phone]

## 2020-08-05 NOTE — Telephone Encounter (Signed)
I agree he cannot be on prolonged treatment with steroids because of his current immunotherapy.

## 2020-08-05 NOTE — Telephone Encounter (Signed)
Pt sister LM stating he is c/o joint pain and has been taking Tylenol and to please call the pt regarding alternating Tyleol and Motrin.  I spoke with the pt directly and he explained he has been taking Tylenol and it is not working. I advised the pt that per our 07/27/20 discussion, he is supposed to alternate Tylenol and Motrin. Pt confirmed he has not been taking the Motrin because he felt better and he just wants to take the steroids.   I reminded the pt he cannot be placed on contiuous steroids because it hinders the effectiveness of his immunotherapy. Pt states he does not like taking pills. I reminded the pt of the recommendations given to him at his 07/18/20 appt and our 3/16 phone call.   Pt insists on being prescribed steroids and states he like them better.

## 2020-08-08 ENCOUNTER — Ambulatory Visit: Payer: Medicare HMO

## 2020-08-08 ENCOUNTER — Other Ambulatory Visit: Payer: Medicare HMO

## 2020-08-08 ENCOUNTER — Ambulatory Visit: Payer: Medicare HMO | Admitting: Physician Assistant

## 2020-08-09 ENCOUNTER — Inpatient Hospital Stay (HOSPITAL_BASED_OUTPATIENT_CLINIC_OR_DEPARTMENT_OTHER): Payer: Medicare HMO | Admitting: Physician Assistant

## 2020-08-09 ENCOUNTER — Inpatient Hospital Stay: Payer: Medicare HMO

## 2020-08-09 ENCOUNTER — Telehealth: Payer: Self-pay

## 2020-08-09 ENCOUNTER — Other Ambulatory Visit: Payer: Self-pay

## 2020-08-09 VITALS — BP 134/82 | HR 86 | Temp 96.1°F | Resp 18 | Wt 192.7 lb

## 2020-08-09 DIAGNOSIS — Z95828 Presence of other vascular implants and grafts: Secondary | ICD-10-CM

## 2020-08-09 DIAGNOSIS — Z5112 Encounter for antineoplastic immunotherapy: Secondary | ICD-10-CM

## 2020-08-09 DIAGNOSIS — C3491 Malignant neoplasm of unspecified part of right bronchus or lung: Secondary | ICD-10-CM

## 2020-08-09 DIAGNOSIS — C799 Secondary malignant neoplasm of unspecified site: Secondary | ICD-10-CM

## 2020-08-09 DIAGNOSIS — Z5111 Encounter for antineoplastic chemotherapy: Secondary | ICD-10-CM | POA: Diagnosis not present

## 2020-08-09 DIAGNOSIS — M2550 Pain in unspecified joint: Secondary | ICD-10-CM

## 2020-08-09 LAB — CMP (CANCER CENTER ONLY)
ALT: 37 U/L (ref 0–44)
AST: 25 U/L (ref 15–41)
Albumin: 4.3 g/dL (ref 3.5–5.0)
Alkaline Phosphatase: 85 U/L (ref 38–126)
Anion gap: 10 (ref 5–15)
BUN: 13 mg/dL (ref 8–23)
CO2: 23 mmol/L (ref 22–32)
Calcium: 8.8 mg/dL — ABNORMAL LOW (ref 8.9–10.3)
Chloride: 109 mmol/L (ref 98–111)
Creatinine: 0.76 mg/dL (ref 0.61–1.24)
GFR, Estimated: >60 mL/min (ref >60)
Glucose, Bld: 97 mg/dL (ref 70–99)
Potassium: 4 mmol/L (ref 3.5–5.1)
Sodium: 142 mmol/L (ref 135–145)
Total Bilirubin: 0.4 mg/dL (ref 0.3–1.2)
Total Protein: 7.9 g/dL (ref 6.5–8.1)

## 2020-08-09 LAB — CBC WITH DIFFERENTIAL (CANCER CENTER ONLY)
Abs Immature Granulocytes: 0.04 10*3/uL (ref 0.00–0.07)
Basophils Absolute: 0 10*3/uL (ref 0.0–0.1)
Basophils Relative: 0 %
Eosinophils Absolute: 0.1 10*3/uL (ref 0.0–0.5)
Eosinophils Relative: 2 %
HCT: 36.3 % — ABNORMAL LOW (ref 39.0–52.0)
Hemoglobin: 12.3 g/dL — ABNORMAL LOW (ref 13.0–17.0)
Immature Granulocytes: 1 %
Lymphocytes Relative: 23 %
Lymphs Abs: 1.3 10*3/uL (ref 0.7–4.0)
MCH: 29.6 pg (ref 26.0–34.0)
MCHC: 33.9 g/dL (ref 30.0–36.0)
MCV: 87.5 fL (ref 80.0–100.0)
Monocytes Absolute: 0.6 10*3/uL (ref 0.1–1.0)
Monocytes Relative: 11 %
Neutro Abs: 3.6 10*3/uL (ref 1.7–7.7)
Neutrophils Relative %: 63 %
Platelet Count: 261 10*3/uL (ref 150–400)
RBC: 4.15 MIL/uL — ABNORMAL LOW (ref 4.22–5.81)
RDW: 15.3 % (ref 11.5–15.5)
WBC Count: 5.7 10*3/uL (ref 4.0–10.5)
nRBC: 0 % (ref 0.0–0.2)

## 2020-08-09 LAB — TSH: TSH: 2.606 u[IU]/mL (ref 0.320–4.118)

## 2020-08-09 MED ORDER — SODIUM CHLORIDE 0.9 % IV SOLN
Freq: Once | INTRAVENOUS | Status: AC
  Filled 2020-08-09: qty 250

## 2020-08-09 MED ORDER — CEMIPLIMAB-RWLC CHEMO INJECTION 350 MG/7ML
350.0000 mg | Freq: Once | INTRAVENOUS | Status: AC
  Administered 2020-08-09: 350 mg via INTRAVENOUS
  Filled 2020-08-09: qty 7

## 2020-08-09 MED ORDER — SODIUM CHLORIDE 0.9% FLUSH
10.0000 mL | Freq: Once | INTRAVENOUS | Status: AC
  Administered 2020-08-09: 10 mL
  Filled 2020-08-09: qty 10

## 2020-08-09 MED ORDER — SODIUM CHLORIDE 0.9% FLUSH
10.0000 mL | INTRAVENOUS | Status: DC | PRN
  Administered 2020-08-09: 10 mL
  Filled 2020-08-09: qty 10

## 2020-08-09 MED ORDER — HEPARIN SOD (PORK) LOCK FLUSH 100 UNIT/ML IV SOLN
500.0000 [IU] | Freq: Once | INTRAVENOUS | Status: AC | PRN
  Administered 2020-08-09: 500 [IU]
  Filled 2020-08-09: qty 5

## 2020-08-09 NOTE — Patient Instructions (Signed)
  [facility] Discharge Instructions for Patients Receiving Chemotherapy  Today you received the following chemotherapy agents Cemipilmab (Libtayo)  To help prevent nausea and vomiting after your treatment, we encourage you to take your nausea medication as directed.   If you develop nausea and vomiting that is not controlled by your nausea medication, call the clinic.   BELOW ARE SYMPTOMS THAT SHOULD BE REPORTED IMMEDIATELY:  *FEVER GREATER THAN 100.5 F  *CHILLS WITH OR WITHOUT FEVER  NAUSEA AND VOMITING THAT IS NOT CONTROLLED WITH YOUR NAUSEA MEDICATION  *UNUSUAL SHORTNESS OF BREATH  *UNUSUAL BRUISING OR BLEEDING  TENDERNESS IN MOUTH AND THROAT WITH OR WITHOUT PRESENCE OF ULCERS  *URINARY PROBLEMS  *BOWEL PROBLEMS  UNUSUAL RASH Items with * indicate a potential emergency and should be followed up as soon as possible.  Feel free to call the clinic should you have any questions or concerns. The clinic phone number is [phone].  Please show the [facility] at check-in to the Emergency Department and triage nurse.

## 2020-08-09 NOTE — Telephone Encounter (Signed)
Pt and his sister, Janae Bridgeman, wanted to know how to alternate Tylenol and Motrin.  I have typed up a suggested schedule for when to take these medication to help with his on-going joint pain and provided to the pt and his sister.  Pt has not been taking the Motrin but they indicate they will go but some and start using it to help.

## 2020-08-29 ENCOUNTER — Encounter: Payer: Self-pay | Admitting: Internal Medicine

## 2020-08-29 ENCOUNTER — Inpatient Hospital Stay: Payer: Medicare HMO | Attending: Internal Medicine | Admitting: Internal Medicine

## 2020-08-29 ENCOUNTER — Inpatient Hospital Stay: Payer: Medicare HMO

## 2020-08-29 ENCOUNTER — Telehealth: Payer: Self-pay | Admitting: Medical Oncology

## 2020-08-29 ENCOUNTER — Other Ambulatory Visit: Payer: Self-pay

## 2020-08-29 VITALS — BP 125/74 | HR 86 | Temp 97.9°F | Resp 19 | Ht 73.0 in | Wt 194.5 lb

## 2020-08-29 DIAGNOSIS — Z5112 Encounter for antineoplastic immunotherapy: Secondary | ICD-10-CM | POA: Insufficient documentation

## 2020-08-29 DIAGNOSIS — E119 Type 2 diabetes mellitus without complications: Secondary | ICD-10-CM | POA: Insufficient documentation

## 2020-08-29 DIAGNOSIS — C3490 Malignant neoplasm of unspecified part of unspecified bronchus or lung: Secondary | ICD-10-CM

## 2020-08-29 DIAGNOSIS — Z79899 Other long term (current) drug therapy: Secondary | ICD-10-CM | POA: Insufficient documentation

## 2020-08-29 DIAGNOSIS — C3491 Malignant neoplasm of unspecified part of right bronchus or lung: Secondary | ICD-10-CM

## 2020-08-29 DIAGNOSIS — C7931 Secondary malignant neoplasm of brain: Secondary | ICD-10-CM | POA: Insufficient documentation

## 2020-08-29 DIAGNOSIS — C799 Secondary malignant neoplasm of unspecified site: Secondary | ICD-10-CM

## 2020-08-29 DIAGNOSIS — I10 Essential (primary) hypertension: Secondary | ICD-10-CM | POA: Insufficient documentation

## 2020-08-29 DIAGNOSIS — R1310 Dysphagia, unspecified: Secondary | ICD-10-CM | POA: Insufficient documentation

## 2020-08-29 DIAGNOSIS — M2550 Pain in unspecified joint: Secondary | ICD-10-CM | POA: Insufficient documentation

## 2020-08-29 LAB — CMP (CANCER CENTER ONLY)
ALT: 24 U/L (ref 0–44)
AST: 22 U/L (ref 15–41)
Albumin: 4.4 g/dL (ref 3.5–5.0)
Alkaline Phosphatase: 75 U/L (ref 38–126)
Anion gap: 9 (ref 5–15)
BUN: 16 mg/dL (ref 8–23)
CO2: 24 mmol/L (ref 22–32)
Calcium: 9.1 mg/dL (ref 8.9–10.3)
Chloride: 107 mmol/L (ref 98–111)
Creatinine: 0.76 mg/dL (ref 0.61–1.24)
GFR, Estimated: >60 mL/min (ref >60)
Glucose, Bld: 108 mg/dL — ABNORMAL HIGH (ref 70–99)
Potassium: 4 mmol/L (ref 3.5–5.1)
Sodium: 140 mmol/L (ref 135–145)
Total Bilirubin: 0.4 mg/dL (ref 0.3–1.2)
Total Protein: 7.9 g/dL (ref 6.5–8.1)

## 2020-08-29 LAB — CBC WITH DIFFERENTIAL (CANCER CENTER ONLY)
Abs Immature Granulocytes: 0.03 10*3/uL (ref 0.00–0.07)
Basophils Absolute: 0 10*3/uL (ref 0.0–0.1)
Basophils Relative: 1 %
Eosinophils Absolute: 0.1 10*3/uL (ref 0.0–0.5)
Eosinophils Relative: 2 %
HCT: 37.5 % — ABNORMAL LOW (ref 39.0–52.0)
Hemoglobin: 12.8 g/dL — ABNORMAL LOW (ref 13.0–17.0)
Immature Granulocytes: 1 %
Lymphocytes Relative: 28 %
Lymphs Abs: 1.5 10*3/uL (ref 0.7–4.0)
MCH: 29.8 pg (ref 26.0–34.0)
MCHC: 34.1 g/dL (ref 30.0–36.0)
MCV: 87.4 fL (ref 80.0–100.0)
Monocytes Absolute: 0.5 10*3/uL (ref 0.1–1.0)
Monocytes Relative: 10 %
Neutro Abs: 3 10*3/uL (ref 1.7–7.7)
Neutrophils Relative %: 58 %
Platelet Count: 240 10*3/uL (ref 150–400)
RBC: 4.29 MIL/uL (ref 4.22–5.81)
RDW: 15.2 % (ref 11.5–15.5)
WBC Count: 5.1 10*3/uL (ref 4.0–10.5)
nRBC: 0 % (ref 0.0–0.2)

## 2020-08-29 LAB — TSH: TSH: 2.266 u[IU]/mL (ref 0.320–4.118)

## 2020-08-29 MED ORDER — SODIUM CHLORIDE 0.9 % IV SOLN
350.0000 mg | Freq: Once | INTRAVENOUS | Status: AC
  Administered 2020-08-29: 350 mg via INTRAVENOUS
  Filled 2020-08-29: qty 7

## 2020-08-29 MED ORDER — HEPARIN SOD (PORK) LOCK FLUSH 100 UNIT/ML IV SOLN
500.0000 [IU] | Freq: Once | INTRAVENOUS | Status: AC | PRN
  Administered 2020-08-29: 500 [IU]
  Filled 2020-08-29: qty 5

## 2020-08-29 MED ORDER — SODIUM CHLORIDE 0.9 % IV SOLN
Freq: Once | INTRAVENOUS | Status: AC
  Filled 2020-08-29: qty 250

## 2020-08-29 MED ORDER — SODIUM CHLORIDE 0.9% FLUSH
10.0000 mL | INTRAVENOUS | Status: DC | PRN
  Administered 2020-08-29: 10 mL
  Filled 2020-08-29: qty 10

## 2020-08-29 NOTE — Progress Notes (Signed)
[place] [phone]   [phone]  OFFICE PROGRESS NOTE  Jean Rosenthal, MD [address] [place] [address]  DIAGNOSIS: stage IV (TX, N2, M1 C) non-small cell lung cancer, adenocarcinoma diagnosed in August 2021 and presented with solitary brain metastasis in addition to mediastinal lymphadenopathy.  PDL1 Expression 70%  Molecular Biomarkers:  Tumor Mutational Burden - 52 Muts/Mb Microsatellite status - MS-Stable Genomic Findings For a complete list of the genes assayed, please refer to the Appendix. NF1 E1694* MTAP loss exons 2-8 RICTOR amplification ATRX A419V BRAF K483E CDKN2A/B CDKN2A loss, CDKN2B loss DNMT3A E205* FGF10 amplification NTRK1 amplification - equivocal? 7 Disease relevant genes with no reportable alterations: ALK, EGFR, ERBB2, KRAS, MET, RET, ROS1  PRIOR THERAPY:  1) Status post right craniotomy with tumor resection followed by [facility] to solitary brain metastasis under the care of Dr. Lisbeth Renshaw and Dr. Sherral Hammers. 2) Concurrent chemoradiation with weekly carboplatin for AUC of 2 and paclitaxel 45 mg/M2. First dose 02/01/2020.Status post 5 cycles. Last dose was given February 29, 2020.  CURRENT THERAPY:  First-line treatment with immunotherapy with Libtayo (Cempilimab) 350 mg IV every 3 weeks.  First dose April 25, 2020.  Status post 5 cycles.  INTERVAL HISTORY: Jared Shea. 65 y.o. male returns to the clinic today for follow-up visit.  The patient is feeling fine today with no concerning complaints except for the arthralgia.  He takes Tylenol and ibuprofen with no improvement.  He denied having any current chest pain, shortness of breath, cough or hemoptysis.  He denied having any fever or chills.  He has no nausea, vomiting, diarrhea or constipation.  He has no headache or visual changes.  He is here today for evaluation before starting cycle #6 of his treatment.  MEDICAL HISTORY: Past Medical History:   Diagnosis Date  . Brain mass   . Bursitis of right hip   . Chest pain 08/12/2018  . Chronic cough   . Dependence on nicotine from cigarettes   . Diabetes mellitus without complication ([place])   . Essential hypertension 07/28/2018  . nscl ca dx'd 12/2019  . Seizures ([place])   . Viral illness 03/23/2019    ALLERGIES:  is allergic to penicillins.  MEDICATIONS:  Current Outpatient Medications  Medication Sig Dispense Refill  . ibuprofen (ADVIL) 200 MG tablet Take 400 mg by mouth every 6 (six) hours as needed for mild pain.    Marland Kitchen levETIRAcetam (KEPPRA) 750 MG tablet Take 1 tablet (750 mg total) by mouth 2 (two) times daily. 180 tablet 4  . losartan (COZAAR) 25 MG tablet Take 1 tablet (25 mg total) by mouth daily. 30 tablet 11  . Melatonin 10 MG TABS Take 10 mg by mouth at bedtime as needed (sleep).    . nortriptyline (PAMELOR) 25 MG capsule Take 2 capsules (50 mg total) by mouth at bedtime. 60 capsule 11  . pseudoephedrine-acetaminophen (TYLENOL SINUS) 30-500 MG TABS tablet Take 1 tablet by mouth every 4 (four) hours as needed (sinus pressure). (Patient not taking: No sig reported)    . SUMAtriptan (IMITREX) 25 MG tablet Take 1 tablet (25 mg total) by mouth every 2 (two) hours as needed for migraine. May repeat in 2 hours if headache persists or recurs. (Patient not taking: Reported on 07/26/2020) 10 tablet 6   No current facility-administered medications for this visit.    SURGICAL HISTORY:  Past Surgical History:  Procedure Laterality Date  . APPLICATION OF CRANIAL  NAVIGATION N/A 01/07/2020   Procedure: APPLICATION OF CRANIAL NAVIGATION;  Surgeon: Ashok Pall, MD;  Location: [facility];  Service: Neurosurgery;  Laterality: N/A;  . CRANIOTOMY Right 01/07/2020   Procedure: RIGHT CRANIOTOMY FOR TUMOR RESECTION;  Surgeon: Ashok Pall, MD;  Location: [facility];  Service: Neurosurgery;  Laterality: Right;  rm 21  . ENDOBRONCHIAL ULTRASOUND N/A 12/21/2019   Procedure: ENDOBRONCHIAL ULTRASOUND;  Surgeon:  Laurin Coder, MD;  [facility];  Service: Pulmonary;  Laterality: N/A;  . FINE NEEDLE ASPIRATION  12/21/2019   Procedure: FINE NEEDLE ASPIRATION (FNA) LINEAR;  Surgeon: Laurin Coder, MD;  [facility];  Service: Pulmonary;;  . IR IMAGING GUIDED PORT INSERTION  03/03/2020  . NO PAST SURGERIES    . VIDEO BRONCHOSCOPY N/A 12/21/2019   Procedure: VIDEO BRONCHOSCOPY WITHOUT FLUORO;  Surgeon: Laurin Coder, MD;  [facility];  Service: Pulmonary;  Laterality: N/A;    REVIEW OF SYSTEMS:  A comprehensive review of systems was negative except for: Musculoskeletal: positive for arthralgias   PHYSICAL EXAMINATION: General appearance: alert, cooperative and no distress Head: Normocephalic, without obvious abnormality, atraumatic Neck: no adenopathy, no JVD, supple, symmetrical, trachea midline and thyroid not enlarged, symmetric, no tenderness/mass/nodules Lymph nodes: Cervical, supraclavicular, and axillary nodes normal. Resp: clear to auscultation bilaterally Back: symmetric, no curvature. ROM normal. No CVA tenderness. Cardio: regular rate and rhythm, S1, S2 normal, no murmur, click, rub or gallop GI: soft, non-tender; bowel sounds normal; no masses,  no organomegaly Extremities: extremities normal, atraumatic, no cyanosis or edema  ECOG PERFORMANCE STATUS: 1 - Symptomatic but completely ambulatory  Blood pressure 125/74, pulse 86, temperature 97.9 F (36.6 C), temperature source Tympanic, resp. rate 19, height _0  (1.854 m), weight 194 lb 8 oz (88.2 kg), SpO2 99 %.  LABORATORY DATA: Lab Results  Component Value Date   WBC 5.1 08/29/2020   HGB 12.8 (L) 08/29/2020   HCT 37.5 (L) 08/29/2020   MCV 87.4 08/29/2020   PLT 240 08/29/2020      Chemistry      Component Value Date/Time   NA 142 08/09/2020 0946   NA 140 11/23/2019 1516   K 4.0 08/09/2020 0946   CL 109 08/09/2020 0946   CO2 23 08/09/2020 0946   BUN 13 08/09/2020 0946   BUN 14  11/23/2019 1516   CREATININE 0.76 08/09/2020 0946      Component Value Date/Time   CALCIUM 8.8 (L) 08/09/2020 0946   ALKPHOS 85 08/09/2020 0946   AST 25 08/09/2020 0946   ALT 37 08/09/2020 0946   BILITOT 0.4 08/09/2020 0946       RADIOGRAPHIC STUDIES: No results found.  ASSESSMENT AND PLAN: This is a very pleasant 65 years old African-American male recently diagnosed with a stage IV (TX, N2, M1c) non-small cell lung cancer, adenocarcinoma presented with solitary brain metastasis in addition to right hilar and mediastinal lymphadenopathy diagnosed in August 2021.  The patient is status post right craniotomy with resection of the solitary brain metastasis with SRS. His PET scan showed no evidence of metastatic disease outside the chest. The patient was treated with a course of concurrent chemoradiation with weekly carboplatin and paclitaxel status post 5 cycles.  He tolerated his treatment well except for dysphagia and odynophagia. He had repeat CT scan of the chest as well as MRI of the brain performed recently and is here for evaluation and discussion of his imaging studies and treatment options. He has a scan of the chest showed improvement  of his disease in the chest. The MRI of the brain showed 3 new brain metastasis measuring up to 3 mm.  He is scheduled to see Dr. Lisbeth Renshaw for evaluation and recommendation regarding these findings. I had a lengthy discussion with the patient today about his systemic treatment options.  His PD-L1 expression is 70%. The patient started treatment with immunotherapy with Cemiplimab 350 mg IV every 3 weeks status post 5 cycles.  He continues to tolerate the treatment well except for the arthralgia which could be immunotherapy mediated. I recommended for the patient to proceed with cycle #6 today as planned. I will see him back for follow-up visit in 3 weeks for evaluation with repeat CT scan of the chest, abdomen pelvis for restaging of his disease. For the  arthralgia, he will continue on Tylenol and ibuprofen if needed and I also discussed with the patient is switching his treatment to chemotherapy if there is no further benefit from the immunotherapy and persistent cough and tolerable arthralgia.  He would like to continue with immunotherapy for now. He was advised to call immediately if he has any other concerning symptoms in the interval. The patient voices understanding of current disease status and treatment options and is in agreement with the current care plan.  All questions were answered. The patient knows to call the clinic with any problems, questions or concerns. We can certainly see the patient much sooner if necessary.   Disclaimer: This note was dictated with voice recognition software. Similar sounding words can inadvertently be transcribed and may not be corrected upon review.

## 2020-08-29 NOTE — Patient Instructions (Signed)
Cemiplimab injection What is this medicine? CEMIPLIMAB (se mip li mab) is a monoclonal antibody. It treats certain types of cancer. Some of the cancers treated are cutaneous squamous cell carcinoma and basal cell carcinoma. This medicine may be used for other purposes; ask your health care provider or pharmacist if you have questions. COMMON BRAND NAME(S): LIBTAYO What should I tell my health care provider before I take this medicine? They need to know if you have any of these conditions:  autoimmune diseases like Crohn's disease, ulcerative colitis, or lupus  have had or planning to have an allogeneic stem cell transplant (uses someone else's stem cells)  history of organ transplant  nervous system problems like myasthenia gravis or Guillain-Barre syndrome  an unusual or allergic reaction to cemiplimab, other drugs, foods, dyes, or preservatives  pregnant or trying to get pregnant  breast-feeding How should I use this medicine? This medicine is for infusion into a vein. It is given by a health care professional in a hospital or clinic setting. A special MedGuide will be given to you before each treatment. Be sure to read this information carefully each time. Talk to your pediatrician regarding the use of this medicine in children. Special care may be needed. Overdosage: If you think you have taken too much of this medicine contact a poison control center or emergency room at once. NOTE: This medicine is only for you. Do not share this medicine with others. What if I miss a dose? It is important not to miss your dose. Call your doctor or health care professional if you are unable to keep an appointment. What may interact with this medicine? Interactions have not been studied. This list may not describe all possible interactions. Give your health care provider a list of all the medicines, herbs, non-prescription drugs, or dietary supplements you use. Also tell them if you smoke, drink  alcohol, or use illegal drugs. Some items may interact with your medicine. What should I watch for while using this medicine? Your condition will be monitored carefully while you are receiving this medicine. You may need blood work done while you are taking this medicine. Do not become pregnant while taking this medicine or for at least 4 months after stopping it. Women should inform their doctor if they wish to become pregnant or think they might be pregnant. There is a potential for serious side effects to an unborn child. Talk to your health care professional or pharmacist for more information. Do not breast-feed an infant while taking this medicine or for at least 4 months after the last dose. What side effects may I notice from receiving this medicine? Side effects that you should report to your doctor or health care professional as soon as possible:  allergic reactions like skin rash, itching or hives; swelling of the face, lips, or tongue  black, tarry stools  bloody or watery diarrhea  breathing problems  changes in vision  changes in voice  chest pain or chest tightness  chills  cough  dizziness  fast or irregular heart beat  feeling faint or lightheaded  hair loss  increased hunger or thirst  muscle weakness  persistent headache  redness, blistering, peeling or loosening of the skin, including inside the mouth  signs and symptoms of kidney injury like trouble passing urine or change in the amount of urine  signs and symptoms of liver injury like dark yellow or brown urine; general ill feeling or flu-like symptoms; light-colored stools; loss of appetite; nausea;  right upper belly pain; unusually weak or tired; yellowing of the eyes or skin  stomach pain  unusual bleeding or bruising  weight gain or weight loss  unusual sweating Side effects that usually do not require medical attention (report these to your doctor or health care professional if they  continue or are bothersome):  bone pain  constipation  muscle pain  tiredness This list may not describe all possible side effects. Call your doctor for medical advice about side effects. You may report side effects to FDA at 1-800-FDA-1088. Where should I keep my medicine? This drug is given in a hospital or clinic and will not be stored at home. NOTE: This sheet is a summary. It may not cover all possible information. If you have questions about this medicine, talk to your doctor, pharmacist, or health care provider.  2021 Elsevier/Gold Standard (2019-06-30 10:47:00)

## 2020-08-29 NOTE — Progress Notes (Signed)
Per Dr Julien Nordmann , it is okay for [name] to get 3 teeth pulled as scheduled.

## 2020-08-29 NOTE — Telephone Encounter (Signed)
I LVM for Arlene to call central scheduling to schedule CT scan for Mr Rack.

## 2020-09-13 ENCOUNTER — Encounter: Payer: Self-pay | Admitting: *Deleted

## 2020-09-13 NOTE — Progress Notes (Signed)
Things That May Be Affecting Your Health:  Alcohol  Hearing loss  Pain    Depression  Home Safety  Sexual Health  X Diabetes  Lack of physical activity  Stress   Difficulty with daily activities X Loneliness  Tiredness   Drug use X Medicines  Tobacco use   Falls  Motor Vehicle Safety  Weight   Food choices  Oral Health X Other (cancer management)    YOUR PERSONALIZED HEALTH PLAN : 1. Schedule your next subsequent Medicare Wellness visit in one year 2. Attend all of your regular appointments to address your medical issues 3. Complete the preventative screenings and services   Annual Wellness Visit   Medicare Covered Preventative Screenings and [place] Men and Women Who How Often Need? Date of Last Service Action  Abdominal Aortic Aneurysm Adults with AAA risk factors Once      Alcohol Misuse and Counseling All Adults Screening once a year if no alcohol misuse. Counseling up to 4 face to face sessions.     Bone Density Measurement  Adults at risk for osteoporosis Once every 2 yrs      Lipid Panel Z13.6 All adults without CV disease Once every 5 yrs       Colorectal Cancer   Stool sample or  Colonoscopy All adults 62 and older   Once every year  Every 10 years        Depression All Adults Once a year  Today   Diabetes Screening Blood glucose, post glucose load, or GTT Z13.1  All adults at risk  Pre-diabetics  Once per year  Twice per year      Diabetes  Self-Management Training All adults Diabetics 10 hrs first year; 2 hours subsequent years. Requires Copay     Glaucoma  Diabetics  Family history of glaucoma  African Americans 4 yrs +  Hispanic Americans 82 yrs + Annually - requires coppay      Hepatitis C Z72.89 or F19.20  High Risk for HCV  Born between 1945 and 1965  Annually  Once XX     HIV Z11.4 All adults based on risk  Annually btw ages 27 & 81 regardless of risk  Annually > 65 yrs if at increased risk      Lung  Cancer Screening Asymptomatic adults aged 59-77 with 30 pack yr history and current smoker OR quit within the last 15 yrs Annually Must have counseling and shared decision making documentation before first screen      Medical Nutrition Therapy Adults with   Diabetes  Renal disease  Kidney transplant within past 3 yrs 3 hours first year; 2 hours subsequent years     Obesity and Counseling All adults Screening once a year Counseling if BMI 30 or higher  Today   Tobacco Use Counseling Adults who use tobacco  Up to 8 visits in one year     Vaccines Z23  Hepatitis B  Influenza   Pneumonia  Adults   Once  Once every flu season  Two different vaccines separated by one year     Next Annual Wellness Visit People with Medicare Every year  Today     Services & Screenings Women Who How Often Need  Date of Last Service Action  Mammogram  Z12.31 Women over 56 One baseline ages 94-39. Annually ager 40 yrs+      Pap tests All women Annually if high risk. Every 2 yrs for normal risk women  Screening for cervical cancer with   Pap (Z01.419 nl or Z01.411abnl) &  HPV Z11.51 Women aged 23 to 86 Once every 5 yrs     Screening pelvic and breast exams All women Annually if high risk. Every 2 yrs for normal risk women     Sexually Transmitted Diseases  Chlamydia  Gonorrhea  Syphilis All at risk adults Annually for non pregnant females at increased risk         [place] Men Who How Ofter Need  Date of Last Service Action  Prostate Cancer - DRE & PSA Men over 50 Annually.  DRE might require a copay.        Sexually Transmitted Diseases  Syphilis All at risk adults Annually for men at increased risk      Health Maintenance List Health Maintenance  Topic Date Due  . Hepatitis C Screening  Never done  . OPHTHALMOLOGY EXAM  Never done  . COVID-19 Vaccine (3 - Pfizer risk 4-dose series) 10/08/2019  . COLONOSCOPY (Pts 45-48yrs Insurance coverage will need to be  confirmed)  12/08/2020 (Originally 01/06/1999)  . TETANUS/TDAP  12/08/2020 (Originally 01/05/1973)  . PNA vac Low Risk Adult (1 of 2 - PCV13) 12/08/2020 (Originally 01/06/2019)  . FOOT EXAM  05/09/2021 (Originally 02/25/2020)  . HEMOGLOBIN A1C  10/09/2020  . INFLUENZA VACCINE  12/12/2020  . HPV VACCINES  Aged Out

## 2020-09-13 NOTE — Progress Notes (Signed)

## 2020-09-16 ENCOUNTER — Encounter (HOSPITAL_COMMUNITY): Payer: Self-pay

## 2020-09-16 ENCOUNTER — Other Ambulatory Visit: Payer: Self-pay

## 2020-09-16 ENCOUNTER — Ambulatory Visit (HOSPITAL_COMMUNITY)
Admission: RE | Admit: 2020-09-16 | Discharge: 2020-09-16 | Disposition: A | Discharge status: Home / Self Care | Payer: Medicare HMO | Source: Ambulatory Visit | Attending: Internal Medicine | Admitting: Internal Medicine

## 2020-09-16 DIAGNOSIS — C3490 Malignant neoplasm of unspecified part of unspecified bronchus or lung: Secondary | ICD-10-CM | POA: Diagnosis present

## 2020-09-16 MED ORDER — HEPARIN SOD (PORK) LOCK FLUSH 100 UNIT/ML IV SOLN
INTRAVENOUS | Status: AC
  Filled 2020-09-16: qty 5

## 2020-09-16 MED ORDER — HEPARIN SOD (PORK) LOCK FLUSH 100 UNIT/ML IV SOLN
500.0000 [IU] | Freq: Once | INTRAVENOUS | Status: AC
  Administered 2020-09-16: 500 [IU] via INTRAVENOUS

## 2020-09-16 MED ORDER — IOHEXOL 300 MG/ML  SOLN
100.0000 mL | Freq: Once | INTRAMUSCULAR | Status: AC | PRN
  Administered 2020-09-16: 100 mL via INTRAVENOUS

## 2020-09-16 MED ORDER — SODIUM CHLORIDE (PF) 0.9 % IJ SOLN
INTRAMUSCULAR | Status: AC
  Filled 2020-09-16: qty 50

## 2020-09-19 ENCOUNTER — Inpatient Hospital Stay: Payer: Medicare HMO

## 2020-09-19 ENCOUNTER — Inpatient Hospital Stay: Payer: Medicare HMO | Admitting: Internal Medicine

## 2020-09-19 ENCOUNTER — Telehealth: Payer: Self-pay | Admitting: Medical Oncology

## 2020-09-19 ENCOUNTER — Other Ambulatory Visit: Payer: Medicare HMO

## 2020-09-19 NOTE — Telephone Encounter (Signed)
Pt said he was not aware of appt and to call his sister to r/s. Schedule message sent.

## 2020-09-20 ENCOUNTER — Ambulatory Visit (INDEPENDENT_AMBULATORY_CARE_PROVIDER_SITE_OTHER): Payer: Medicare HMO | Admitting: Student

## 2020-09-20 ENCOUNTER — Other Ambulatory Visit: Payer: Self-pay

## 2020-09-20 ENCOUNTER — Encounter: Payer: Self-pay | Admitting: Student

## 2020-09-20 ENCOUNTER — Telehealth: Payer: Self-pay | Admitting: Internal Medicine

## 2020-09-20 DIAGNOSIS — M2550 Pain in unspecified joint: Secondary | ICD-10-CM

## 2020-09-20 DIAGNOSIS — Z79899 Other long term (current) drug therapy: Secondary | ICD-10-CM

## 2020-09-20 MED ORDER — DICLOFENAC SODIUM 1 % EX GEL: 2.0000 g | Freq: Four times a day (QID) | CUTANEOUS | 3 refills | Status: DC

## 2020-09-20 NOTE — Patient Instructions (Addendum)
Thank you, Jared Shea. for allowing [place] to provide your care today. Today we discussed   Multiple joint pain I am sorry that you are going through this, I am writing a prescription for Voltaren gel.  Please apply this to the areas of pain 4 times a day.  You can also purchase over-the-counter Biofreeze which may help with some numbing of the joints.  I will also reach out to her oncologist to let them know that we are concerned about this persistent pain.  I have ordered the following labs for you:  Lab Orders  No laboratory test(s) ordered today     Referrals ordered today:   Referral Orders  No referral(s) requested today     I have ordered the following medication/changed the following medications:   Stop the following medications: There are no discontinued medications.   Start the following medications: Meds ordered this encounter  Medications  . diclofenac Sodium (VOLTAREN) 1 % GEL    Sig: Apply 2 g topically 4 (four) times daily.    Dispense:  350 g    Refill:  3     Follow up: 4-6 months as needed     Should you have any questions or concerns please call the internal medicine clinic at [phone].     Sanjuana Letters, D.O. [place]

## 2020-09-20 NOTE — Progress Notes (Signed)
CC: Joint pain  HPI:  [name] is a 65 y.o. male with a past medical history stated below and presents today for joint pain. Please see problem based assessment and plan for additional details.  Past Medical History:  Diagnosis Date  . Brain mass   . Bursitis of right hip   . Chest pain 08/12/2018  . Chronic cough   . Dependence on nicotine from cigarettes   . Diabetes mellitus without complication ([place])   . Essential hypertension 07/28/2018  . nscl ca dx'd 12/2019  . Seizures ([place])   . Viral illness 03/23/2019    Current Outpatient Medications on File Prior to Visit  Medication Sig Dispense Refill  . ibuprofen (ADVIL) 200 MG tablet Take 400 mg by mouth every 6 (six) hours as needed for mild pain.    Marland Kitchen levETIRAcetam (KEPPRA) 750 MG tablet Take 1 tablet (750 mg total) by mouth 2 (two) times daily. 180 tablet 4  . losartan (COZAAR) 25 MG tablet Take 1 tablet (25 mg total) by mouth daily. 30 tablet 11  . Melatonin 10 MG TABS Take 10 mg by mouth at bedtime as needed (sleep).    . nortriptyline (PAMELOR) 25 MG capsule Take 2 capsules (50 mg total) by mouth at bedtime. 60 capsule 11  . pseudoephedrine-acetaminophen (TYLENOL SINUS) 30-500 MG TABS tablet Take 1 tablet by mouth every 4 (four) hours as needed (sinus pressure).    . SUMAtriptan (IMITREX) 25 MG tablet Take 1 tablet (25 mg total) by mouth every 2 (two) hours as needed for migraine. May repeat in 2 hours if headache persists or recurs. (Patient not taking: No sig reported) 10 tablet 6   No current facility-administered medications on file prior to visit.    Family History  Problem Relation Age of Onset  . Heart disease Mother        CABG  . Kidney disease Sister   . Diabetes Brother   . Kidney disease Brother        KIDNEY TRANSPLANT  . Hypertension Sister   . Healthy Daughter   . Healthy Daughter   . Healthy Son     Social History   Socioeconomic History  . Marital status: Divorced    Spouse name: Not  on file  . Number of children: Not on file  . Years of education: Not on file  . Highest education level: Not on file  Occupational History  . Not on file  Tobacco Use  . Smoking status: Former Smoker    Packs/day: 1.00    Years: 48.00    Pack years: 48.00    Types: Cigarettes    Start date: 08/07/1970    Quit date: 12/15/2019    Years since quitting: 0.7  . Smokeless tobacco: Never Used  Vaping Use  . Vaping Use: Never used  Substance and Sexual Activity  . Alcohol use: Not Currently  . Drug use: Never  . Sexual activity: Not on file    Comment: YES  Other Topics Concern  . Not on file  Social History Narrative  . Not on file   Social Determinants of Health   Financial Resource Strain: High Risk  . Difficulty of Paying Living Expenses: Hard  Food Insecurity: No Food Insecurity  . Worried About Charity fundraiser in the Last Year: Never true  . Ran Out of Food in the Last Year: Never true  Transportation Needs: Unmet Transportation Needs  . Lack of Transportation (Medical): Yes  .  Lack of Transportation (Non-Medical): Yes  Physical Activity: Not on file  Stress: Not on file  Social Connections: Not on file  Intimate Partner Violence: Not on file    Review of Systems: ROS negative except for what is noted on the assessment and plan.  Vitals:   09/20/20 0913 09/20/20 0952  BP: (!) 151/80 124/76  Pulse: 91 89  Temp: 97.6 F (36.4 C)   TempSrc: Oral   SpO2: 99%   Weight: 199 lb 6.4 oz (90.4 kg)   Height: 6\' 1"  (1.854 m)      Physical Exam: Constitutional: well-appearing, sitting in chair comfortably no acute distress HENT: normocephalic atraumatic Eyes: conjunctiva non-erythematous Neck: supple Cardiovascular: regular rate and rhythm, no m/r/g Pulmonary/Chest: normal work of breathing on room air MSK: normal bulk and tone.  Bilateral knee joint without erythema, warmth, swelling.  Normal range of motion.  Patient with tenderness with knee flexion  bilaterally.  Bilateral wrists without erythema, warmth, swelling.  Normal range of motion.  Bilateral shoulder joint without erythema, warmth, swelling.  Normal range of motion.  Tender with flexion of the shoulder bilaterally. Neurological: alert & oriented x 3, 5/5 strength in bilateral upper and lower extremities, normal gait Skin: warm and dry Psych: Normal mood and thought process   Assessment & Plan:   See Encounters Tab for problem based charting.  Patient discussed with Dr. Newell Coral, D.O. [facility], PGY-1 Pager: [phone], Phone: [phone] Date 09/20/2020 Time 11:47 AM

## 2020-09-20 NOTE — Telephone Encounter (Signed)
R/s appts per 5/9 sch msg. Pt's sister is aware.

## 2020-09-20 NOTE — Assessment & Plan Note (Addendum)
Assessment: Patient presents to the clinic this morning with persistent polyarthralgia.  He states that this onset after starting chemotherapy.  He has expressed these concerns to his oncologist multiple times and per patient they have noted for him to continue Tylenol and ibuprofen.  Upon chart review it appears as though as he has 1 more chemotherapy treatment patient was also given the option to change treatment due to persistent polyarthralgia.  He declined at that time stating he would continue with chemotherapy.  On my exam patient has symmetric polyarthralgia, consistent with symmetric paraneoplastic arthralgia.  Low suspicion that this is due to infectious or other systemic etiologies. Continue with Tylenol Motrin and wrote prescription for Voltaren gel.  Limited overall medical management, it appears as though oncology recommended against chronic steroids use.  Plan: -Follow-up with oncologist -Start Voltaren gel and continue alternating Tylenol & Motrin -We will forward chart to patient's oncologist

## 2020-09-21 NOTE — Progress Notes (Signed)
Internal Medicine Clinic Attending  Case discussed with Dr. Katsadouros  At the time of the visit.  We reviewed the resident's history and exam and pertinent patient test results.  I agree with the assessment, diagnosis, and plan of care documented in the resident's note.  

## 2020-09-22 ENCOUNTER — Inpatient Hospital Stay (HOSPITAL_BASED_OUTPATIENT_CLINIC_OR_DEPARTMENT_OTHER): Payer: Medicare HMO | Admitting: Internal Medicine

## 2020-09-22 ENCOUNTER — Other Ambulatory Visit: Payer: Self-pay

## 2020-09-22 ENCOUNTER — Inpatient Hospital Stay: Payer: Medicare HMO | Attending: Internal Medicine

## 2020-09-22 ENCOUNTER — Inpatient Hospital Stay: Payer: Medicare HMO

## 2020-09-22 ENCOUNTER — Encounter: Payer: Self-pay | Admitting: Internal Medicine

## 2020-09-22 VITALS — BP 137/84 | HR 86 | Temp 97.7°F | Resp 20 | Ht 73.0 in | Wt 197.1 lb

## 2020-09-22 DIAGNOSIS — Z87891 Personal history of nicotine dependence: Secondary | ICD-10-CM | POA: Insufficient documentation

## 2020-09-22 DIAGNOSIS — E119 Type 2 diabetes mellitus without complications: Secondary | ICD-10-CM | POA: Insufficient documentation

## 2020-09-22 DIAGNOSIS — C7931 Secondary malignant neoplasm of brain: Secondary | ICD-10-CM | POA: Insufficient documentation

## 2020-09-22 DIAGNOSIS — K4090 Unilateral inguinal hernia, without obstruction or gangrene, not specified as recurrent: Secondary | ICD-10-CM | POA: Diagnosis not present

## 2020-09-22 DIAGNOSIS — Z5111 Encounter for antineoplastic chemotherapy: Secondary | ICD-10-CM | POA: Diagnosis present

## 2020-09-22 DIAGNOSIS — Z5112 Encounter for antineoplastic immunotherapy: Secondary | ICD-10-CM

## 2020-09-22 DIAGNOSIS — C3491 Malignant neoplasm of unspecified part of right bronchus or lung: Secondary | ICD-10-CM | POA: Insufficient documentation

## 2020-09-22 DIAGNOSIS — I10 Essential (primary) hypertension: Secondary | ICD-10-CM | POA: Insufficient documentation

## 2020-09-22 DIAGNOSIS — Z79899 Other long term (current) drug therapy: Secondary | ICD-10-CM | POA: Diagnosis not present

## 2020-09-22 DIAGNOSIS — C799 Secondary malignant neoplasm of unspecified site: Secondary | ICD-10-CM

## 2020-09-22 LAB — CBC WITH DIFFERENTIAL (CANCER CENTER ONLY)
Abs Immature Granulocytes: 0.03 10*3/uL (ref 0.00–0.07)
Basophils Absolute: 0 10*3/uL (ref 0.0–0.1)
Basophils Relative: 1 %
Eosinophils Absolute: 0.1 10*3/uL (ref 0.0–0.5)
Eosinophils Relative: 2 %
HCT: 37.1 % — ABNORMAL LOW (ref 39.0–52.0)
Hemoglobin: 12.8 g/dL — ABNORMAL LOW (ref 13.0–17.0)
Immature Granulocytes: 1 %
Lymphocytes Relative: 19 %
Lymphs Abs: 1.1 10*3/uL (ref 0.7–4.0)
MCH: 30.5 pg (ref 26.0–34.0)
MCHC: 34.5 g/dL (ref 30.0–36.0)
MCV: 88.5 fL (ref 80.0–100.0)
Monocytes Absolute: 0.7 10*3/uL (ref 0.1–1.0)
Monocytes Relative: 12 %
Neutro Abs: 3.6 10*3/uL (ref 1.7–7.7)
Neutrophils Relative %: 65 %
Platelet Count: 298 10*3/uL (ref 150–400)
RBC: 4.19 MIL/uL — ABNORMAL LOW (ref 4.22–5.81)
RDW: 15.1 % (ref 11.5–15.5)
WBC Count: 5.5 10*3/uL (ref 4.0–10.5)
nRBC: 0 % (ref 0.0–0.2)

## 2020-09-22 LAB — CMP (CANCER CENTER ONLY)
ALT: 43 U/L (ref 0–44)
AST: 29 U/L (ref 15–41)
Albumin: 4.2 g/dL (ref 3.5–5.0)
Alkaline Phosphatase: 97 U/L (ref 38–126)
Anion gap: 9 (ref 5–15)
BUN: 17 mg/dL (ref 8–23)
CO2: 25 mmol/L (ref 22–32)
Calcium: 9.4 mg/dL (ref 8.9–10.3)
Chloride: 105 mmol/L (ref 98–111)
Creatinine: 0.78 mg/dL (ref 0.61–1.24)
GFR, Estimated: >60 mL/min (ref >60)
Glucose, Bld: 111 mg/dL — ABNORMAL HIGH (ref 70–99)
Potassium: 3.9 mmol/L (ref 3.5–5.1)
Sodium: 139 mmol/L (ref 135–145)
Total Bilirubin: 0.7 mg/dL (ref 0.3–1.2)
Total Protein: 7.9 g/dL (ref 6.5–8.1)

## 2020-09-22 LAB — TSH: TSH: 2.033 u[IU]/mL (ref 0.320–4.118)

## 2020-09-22 MED ORDER — HEPARIN SOD (PORK) LOCK FLUSH 100 UNIT/ML IV SOLN
500.0000 [IU] | Freq: Once | INTRAVENOUS | Status: AC | PRN
  Administered 2020-09-22: 500 [IU]
  Filled 2020-09-22: qty 5

## 2020-09-22 MED ORDER — SODIUM CHLORIDE 0.9% FLUSH
10.0000 mL | INTRAVENOUS | Status: DC | PRN
  Administered 2020-09-22: 10 mL
  Filled 2020-09-22: qty 10

## 2020-09-22 MED ORDER — SODIUM CHLORIDE 0.9 % IV SOLN
Freq: Once | INTRAVENOUS | Status: AC
  Filled 2020-09-22: qty 250

## 2020-09-22 MED ORDER — SODIUM CHLORIDE 0.9 % IV SOLN
350.0000 mg | Freq: Once | INTRAVENOUS | Status: AC
  Administered 2020-09-22: 350 mg via INTRAVENOUS
  Filled 2020-09-22: qty 7

## 2020-09-22 NOTE — Patient Instructions (Signed)
[facility]  Discharge Instructions: Thank you for choosing [facility] to provide your oncology and hematology care.   If you have a lab appointment with the [facility], please go directly to the [facility] and check in at the registration area.   Wear comfortable clothing and clothing appropriate for easy access to any Portacath or PICC line.   We strive to give you quality time with your provider. You may need to reschedule your appointment if you arrive late (15 or more minutes).  Arriving late affects you and other patients whose appointments are after yours.  Also, if you miss three or more appointments without notifying the office, you may be dismissed from the clinic at the provider's discretion.      For prescription refill requests, have your pharmacy contact our office and allow 72 hours for refills to be completed.    Today you received the following chemotherapy and/or immunotherapy agents: Libtayo    To help prevent nausea and vomiting after your treatment, we encourage you to take your nausea medication as directed.  BELOW ARE SYMPTOMS THAT SHOULD BE REPORTED IMMEDIATELY: . *FEVER GREATER THAN 100.4 F (38 C) OR HIGHER . *CHILLS OR SWEATING . *NAUSEA AND VOMITING THAT IS NOT CONTROLLED WITH YOUR NAUSEA MEDICATION . *UNUSUAL SHORTNESS OF BREATH . *UNUSUAL BRUISING OR BLEEDING . *URINARY PROBLEMS (pain or burning when urinating, or frequent urination) . *BOWEL PROBLEMS (unusual diarrhea, constipation, pain near the anus) . TENDERNESS IN MOUTH AND THROAT WITH OR WITHOUT PRESENCE OF ULCERS (sore throat, sores in mouth, or a toothache) . UNUSUAL RASH, SWELLING OR PAIN  . UNUSUAL VAGINAL DISCHARGE OR ITCHING   Items with * indicate a potential emergency and should be followed up as soon as possible or go to the Emergency Department if any problems should occur.  Please show the CHEMOTHERAPY ALERT CARD or IMMUNOTHERAPY ALERT CARD  at check-in to the Emergency Department and triage nurse.  Should you have questions after your visit or need to cancel or reschedule your appointment, please contact [facility]  Dept: [phone]  and follow the prompts.  Office hours are 8:00 a.m. to 4:30 p.m. Monday - Friday. Please note that voicemails left after 4:00 p.m. may not be returned until the following business day.  We are closed weekends and major holidays. You have access to a nurse at all times for urgent questions. Please call the main number to the clinic Dept: [phone] and follow the prompts.   For any non-urgent questions, you may also contact your provider using MyChart. We now offer e-Visits for anyone 71 and older to request care online for non-urgent symptoms. For details visit [email or web address].   Also download the MyChart app! Go to the app store, search "MyChart", open the app, select [facility], and log in with your MyChart username and password.  Due to Covid, a mask is required upon entering the hospital/clinic. If you do not have a mask, one will be given to you upon arrival. For doctor visits, patients may have 1 support person aged 56 or older with them. For treatment visits, patients cannot have anyone with them due to current Covid guidelines and our immunocompromised population.

## 2020-09-22 NOTE — Progress Notes (Signed)
[place] [phone]   [phone]  OFFICE PROGRESS NOTE  Jean Rosenthal, MD [address] [place] [address]  DIAGNOSIS: stage IV (TX, N2, M1 C) non-small cell lung cancer, adenocarcinoma diagnosed in August 2021 and presented with solitary brain metastasis in addition to mediastinal lymphadenopathy.  PDL1 Expression 70%  Molecular Biomarkers:  Tumor Mutational Burden - 52 Muts/Mb Microsatellite status - MS-Stable Genomic Findings For a complete list of the genes assayed, please refer to the Appendix. NF1 E1694* MTAP loss exons 2-8 RICTOR amplification ATRX A419V BRAF K483E CDKN2A/B CDKN2A loss, CDKN2B loss DNMT3A E205* FGF10 amplification NTRK1 amplification - equivocal? 7 Disease relevant genes with no reportable alterations: ALK, EGFR, ERBB2, KRAS, MET, RET, ROS1  PRIOR THERAPY:  1) Status post right craniotomy with tumor resection followed by [facility] to solitary brain metastasis under the care of Dr. Lisbeth Renshaw and Dr. Sherral Hammers. 2) Concurrent chemoradiation with weekly carboplatin for AUC of 2 and paclitaxel 45 mg/M2. First dose 02/01/2020.Status post 5 cycles. Last dose was given February 29, 2020.  CURRENT THERAPY:  First-line treatment with immunotherapy with Libtayo (Cempilimab) 350 mg IV every 3 weeks.  First dose April 25, 2020.  Status post 6 cycles.  INTERVAL HISTORY: Jared Shea. 65 y.o. male returns to the clinic today for follow-up visit accompanied by his sister.  The patient is feeling fine today with no concerning complaints except for the baseline arthralgia in the knees bilaterally.  He denied having any current chest pain, shortness of breath, cough or hemoptysis.  He denied having any fever or chills.  He has no nausea, vomiting, diarrhea or constipation.  He has no headache or visual changes.  He continues to tolerate his treatment with Libtayo (Cempilimab) fairly well.  The patient had repeat CT scan  of the chest, abdomen pelvis performed recently and he is here for evaluation and discussion of his discuss results.  MEDICAL HISTORY: Past Medical History:  Diagnosis Date  . Brain mass   . Bursitis of right hip   . Chest pain 08/12/2018  . Chronic cough   . Dependence on nicotine from cigarettes   . Diabetes mellitus without complication ([place])   . Essential hypertension 07/28/2018  . nscl ca dx'd 12/2019  . Seizures ([place])   . Viral illness 03/23/2019    ALLERGIES:  is allergic to penicillins.  MEDICATIONS:  Current Outpatient Medications  Medication Sig Dispense Refill  . diclofenac Sodium (VOLTAREN) 1 % GEL Apply 2 g topically 4 (four) times daily. 350 g 3  . ibuprofen (ADVIL) 200 MG tablet Take 400 mg by mouth every 6 (six) hours as needed for mild pain.    Marland Kitchen levETIRAcetam (KEPPRA) 750 MG tablet Take 1 tablet (750 mg total) by mouth 2 (two) times daily. 180 tablet 4  . losartan (COZAAR) 25 MG tablet Take 1 tablet (25 mg total) by mouth daily. 30 tablet 11  . Melatonin 10 MG TABS Take 10 mg by mouth at bedtime as needed (sleep).    . nortriptyline (PAMELOR) 25 MG capsule Take 2 capsules (50 mg total) by mouth at bedtime. 60 capsule 11  . pseudoephedrine-acetaminophen (TYLENOL SINUS) 30-500 MG TABS tablet Take 1 tablet by mouth every 4 (four) hours as needed (sinus pressure).    . SUMAtriptan (IMITREX) 25 MG tablet Take 1 tablet (25 mg total) by mouth every 2 (two) hours as needed for migraine. May repeat in 2 hours if headache persists or  recurs. 10 tablet 6   No current facility-administered medications for this visit.    SURGICAL HISTORY:  Past Surgical History:  Procedure Laterality Date  . APPLICATION OF CRANIAL NAVIGATION N/A 01/07/2020   Procedure: APPLICATION OF CRANIAL NAVIGATION;  Surgeon: Ashok Pall, MD;  Location: [place];  Service: Neurosurgery;  Laterality: N/A;  . CRANIOTOMY Right 01/07/2020   Procedure: RIGHT CRANIOTOMY FOR TUMOR RESECTION;  Surgeon: Ashok Pall, MD;  Location: [place];  Service: Neurosurgery;  Laterality: Right;  rm 21  . ENDOBRONCHIAL ULTRASOUND N/A 12/21/2019   Procedure: ENDOBRONCHIAL ULTRASOUND;  Surgeon: Laurin Coder, MD;  Location: [facility];  Service: Pulmonary;  Laterality: N/A;  . FINE NEEDLE ASPIRATION  12/21/2019   Procedure: FINE NEEDLE ASPIRATION (FNA) LINEAR;  Surgeon: Laurin Coder, MD;  Location: [facility];  Service: Pulmonary;;  . IR IMAGING GUIDED PORT INSERTION  03/03/2020  . NO PAST SURGERIES    . VIDEO BRONCHOSCOPY N/A 12/21/2019   Procedure: VIDEO BRONCHOSCOPY WITHOUT FLUORO;  Surgeon: Laurin Coder, MD;  Location: [facility];  Service: Pulmonary;  Laterality: N/A;    REVIEW OF SYSTEMS:  Constitutional: positive for fatigue Eyes: negative Ears, nose, mouth, throat, and face: negative Respiratory: negative Cardiovascular: negative Gastrointestinal: negative Genitourinary:negative Integument/breast: negative Hematologic/lymphatic: negative Musculoskeletal:positive for arthralgias Neurological: negative Behavioral/Psych: negative Endocrine: negative Allergic/Immunologic: negative   PHYSICAL EXAMINATION: General appearance: alert, cooperative and no distress Head: Normocephalic, without obvious abnormality, atraumatic Neck: no adenopathy, no JVD, supple, symmetrical, trachea midline and thyroid not enlarged, symmetric, no tenderness/mass/nodules Lymph nodes: Cervical, supraclavicular, and axillary nodes normal. Resp: clear to auscultation bilaterally Back: symmetric, no curvature. ROM normal. No CVA tenderness. Cardio: regular rate and rhythm, S1, S2 normal, no murmur, click, rub or gallop GI: soft, non-tender; bowel sounds normal; no masses,  no organomegaly Extremities: extremities normal, atraumatic, no cyanosis or edema Neurologic: Alert and oriented X 3, normal strength and tone. Normal symmetric reflexes. Normal coordination and gait  ECOG PERFORMANCE STATUS: 1 - Symptomatic  but completely ambulatory  Blood pressure 137/84, pulse 86, temperature 97.7 F (36.5 C), temperature source Tympanic, resp. rate 20, height _0  (1.854 m), weight 197 lb 1.6 oz (89.4 kg), SpO2 99 %.  LABORATORY DATA: Lab Results  Component Value Date   WBC 5.5 09/22/2020   HGB 12.8 (L) 09/22/2020   HCT 37.1 (L) 09/22/2020   MCV 88.5 09/22/2020   PLT 298 09/22/2020      Chemistry      Component Value Date/Time   NA 139 09/22/2020 1004   NA 140 11/23/2019 1516   K 3.9 09/22/2020 1004   CL 105 09/22/2020 1004   CO2 25 09/22/2020 1004   BUN 17 09/22/2020 1004   BUN 14 11/23/2019 1516   CREATININE 0.78 09/22/2020 1004      Component Value Date/Time   CALCIUM 9.4 09/22/2020 1004   ALKPHOS 97 09/22/2020 1004   AST 29 09/22/2020 1004   ALT 43 09/22/2020 1004   BILITOT 0.7 09/22/2020 1004       RADIOGRAPHIC STUDIES: CT Chest W Contrast  Result Date: 09/17/2020 CLINICAL DATA:  Non-small cell lung cancer, chemotherapy ongoing, XRT complete. Cough, intermittent shortness of breath. EXAM: CT CHEST, ABDOMEN, AND PELVIS WITH CONTRAST TECHNIQUE: Multidetector CT imaging of the chest, abdomen and pelvis was performed following the standard protocol during bolus administration of intravenous contrast. CONTRAST:  141m OMNIPAQUE IOHEXOL 300 MG/ML  SOLN COMPARISON:  CT chest dated 07/05/2020. CT abdomen/pelvis dated 02/24/2020. FINDINGS: CT CHEST FINDINGS Cardiovascular:  Heart is normal in size.  No pericardial effusion. No evidence of thoracic aortic aneurysm. Mild atherosclerotic calcifications of the aortic arch/root. Mild coronary atherosclerosis of the LAD and left circumflex. Right chest port terminates at the cavoatrial junction. Mediastinum/Nodes: Small mediastinal lymph nodes, including a dominant 7 mm short axis low right paratracheal node (series 2/image 25) and a 6 mm short axis subcarinal node (series 2/image 33), within normal limits. Abnormal soft tissue in the left infrahilar  region now measures 8 mm short axis (series 2/image 37), previously 9 mm. This corresponds to the site of the patient's known prior FDG avid tumor. Visualized thyroid is unremarkable. Lungs/Pleura: Mild centrilobular and paraseptal emphysematous changes, upper lung predominant. Radiation changes in the bilateral paramediastinal regions and medial right middle lobe. Prior nodular opacities in the anterior left upper lobe (series 6/image 65) and lateral left lower lobe series 6/image 105) are more vague on the current study, possibly reflecting post infectious/inflammatory scarring. No suspicious pulmonary nodules. No focal consolidation. Trace left pleural fluid (series 2/image 37).  No pneumothorax. Musculoskeletal: No focal osseous lesions. CT ABDOMEN PELVIS FINDINGS Hepatobiliary: Liver is within normal limits. Gallbladder is unremarkable. No intrahepatic or extrahepatic ductal dilatation. Pancreas: Within normal limits. Spleen: Within normal limits. Adrenals/Urinary Tract: Adrenal glands are within normal limits. Kidneys are within normal limits.  No hydronephrosis. Bladder is within normal limits. Stomach/Bowel: Stomach is within normal limits. No evidence of bowel obstruction. Normal appendix (series 2/image 94). Mild left colonic diverticulosis, without evidence of diverticulitis. Vascular/Lymphatic: No evidence of abdominal aortic aneurysm. Atherosclerotic calcifications of the abdominal aorta and branch vessels. Dominant 18 mm short axis portacaval node (series 2/image 66), unchanged. Additional upper abdominal nodes in the porta hepatis measuring up to 17 mm short axis (series 2/image 64). Small retroperitoneal nodes measuring up to 7 mm short axis (series 2/image 34), within normal limits. Reproductive: Prostate is unremarkable, noting dystrophic calcifications. Other: Tiny fat containing right inguinal hernia. Small fat containing left inguinal hernia (series 2/image 120). No abdominopelvic ascites.  Musculoskeletal: Mild degenerative changes at L4-5. IMPRESSION: Radiation changes in the lungs bilaterally. Abnormal soft tissue in the left infrahilar region measures 8 mm, corresponding to the patient's prior FDG avid tumor, stable versus mildly improved. Small upper abdominal nodes measuring up to 18 mm short axis, similar to the prior. However, upper abdominal nodal metastases are not excluded. Consider PET-CT for further evaluation. Electronically Signed   By: Julian Hy M.D.   On: 09/17/2020 10:52   CT Abdomen Pelvis W Contrast  Result Date: 09/17/2020 CLINICAL DATA:  Non-small cell lung cancer, chemotherapy ongoing, XRT complete. Cough, intermittent shortness of breath. EXAM: CT CHEST, ABDOMEN, AND PELVIS WITH CONTRAST TECHNIQUE: Multidetector CT imaging of the chest, abdomen and pelvis was performed following the standard protocol during bolus administration of intravenous contrast. CONTRAST:  149m OMNIPAQUE IOHEXOL 300 MG/ML  SOLN COMPARISON:  CT chest dated 07/05/2020. CT abdomen/pelvis dated 02/24/2020. FINDINGS: CT CHEST FINDINGS Cardiovascular: Heart is normal in size.  No pericardial effusion. No evidence of thoracic aortic aneurysm. Mild atherosclerotic calcifications of the aortic arch/root. Mild coronary atherosclerosis of the LAD and left circumflex. Right chest port terminates at the cavoatrial junction. Mediastinum/Nodes: Small mediastinal lymph nodes, including a dominant 7 mm short axis low right paratracheal node (series 2/image 25) and a 6 mm short axis subcarinal node (series 2/image 33), within normal limits. Abnormal soft tissue in the left infrahilar region now measures 8 mm short axis (series 2/image 37), previously 9 mm. This corresponds to  the site of the patient's known prior FDG avid tumor. Visualized thyroid is unremarkable. Lungs/Pleura: Mild centrilobular and paraseptal emphysematous changes, upper lung predominant. Radiation changes in the bilateral paramediastinal  regions and medial right middle lobe. Prior nodular opacities in the anterior left upper lobe (series 6/image 65) and lateral left lower lobe series 6/image 105) are more vague on the current study, possibly reflecting post infectious/inflammatory scarring. No suspicious pulmonary nodules. No focal consolidation. Trace left pleural fluid (series 2/image 37).  No pneumothorax. Musculoskeletal: No focal osseous lesions. CT ABDOMEN PELVIS FINDINGS Hepatobiliary: Liver is within normal limits. Gallbladder is unremarkable. No intrahepatic or extrahepatic ductal dilatation. Pancreas: Within normal limits. Spleen: Within normal limits. Adrenals/Urinary Tract: Adrenal glands are within normal limits. Kidneys are within normal limits.  No hydronephrosis. Bladder is within normal limits. Stomach/Bowel: Stomach is within normal limits. No evidence of bowel obstruction. Normal appendix (series 2/image 94). Mild left colonic diverticulosis, without evidence of diverticulitis. Vascular/Lymphatic: No evidence of abdominal aortic aneurysm. Atherosclerotic calcifications of the abdominal aorta and branch vessels. Dominant 18 mm short axis portacaval node (series 2/image 66), unchanged. Additional upper abdominal nodes in the porta hepatis measuring up to 17 mm short axis (series 2/image 64). Small retroperitoneal nodes measuring up to 7 mm short axis (series 2/image 34), within normal limits. Reproductive: Prostate is unremarkable, noting dystrophic calcifications. Other: Tiny fat containing right inguinal hernia. Small fat containing left inguinal hernia (series 2/image 120). No abdominopelvic ascites. Musculoskeletal: Mild degenerative changes at L4-5. IMPRESSION: Radiation changes in the lungs bilaterally. Abnormal soft tissue in the left infrahilar region measures 8 mm, corresponding to the patient's prior FDG avid tumor, stable versus mildly improved. Small upper abdominal nodes measuring up to 18 mm short axis, similar to the  prior. However, upper abdominal nodal metastases are not excluded. Consider PET-CT for further evaluation. Electronically Signed   By: Julian Hy M.D.   On: 09/17/2020 10:52    ASSESSMENT AND PLAN: This is a very pleasant 65 years old African-American male recently diagnosed with a stage IV (TX, N2, M1c) non-small cell lung cancer, adenocarcinoma presented with solitary brain metastasis in addition to right hilar and mediastinal lymphadenopathy diagnosed in August 2021.  The patient is status post right craniotomy with resection of the solitary brain metastasis with SRS. His PET scan showed no evidence of metastatic disease outside the chest. The patient was treated with a course of concurrent chemoradiation with weekly carboplatin and paclitaxel status post 5 cycles.  He tolerated his treatment well except for dysphagia and odynophagia. His PD-L1 expression is 70%. The patient started treatment with immunotherapy with Cemiplimab 350 mg IV every 3 weeks status post 6 cycles.  He has been tolerating this treatment well with no concerning adverse effect except for the arthralgia. He had repeat CT scan of the chest, abdomen pelvis performed recently.  I personally and independently reviewed the scans and discussed the results with the patient and his sister.  His scan showed no concerning findings for disease progression and there was some mild improvement in the left infrahilar soft tissue mass. I recommended for the patient to proceed with cycle #7 of his treatment today as planned. For the arthralgia he will continue his current treatment with Tylenol and ibuprofen as well as topical creams as needed. He will come back for follow-up visit in 3 weeks for evaluation before the next cycle of his treatment. The patient was advised to call immediately if he has any concerning symptoms in the interval.  For the arthralgia, he will continue on Tylenol and ibuprofen if needed and I also discussed with  the patient is switching his treatment to chemotherapy if there is no further benefit from the immunotherapy and persistent cough and tolerable arthralgia.  He would like to continue with immunotherapy for now.  The patient voices understanding of current disease status and treatment options and is in agreement with the current care plan.  All questions were answered. The patient knows to call the clinic with any problems, questions or concerns. We can certainly see the patient much sooner if necessary.   Disclaimer: This note was dictated with voice recognition software. Similar sounding words can inadvertently be transcribed and may not be corrected upon review.

## 2020-10-05 ENCOUNTER — Encounter: Payer: Self-pay | Admitting: *Deleted

## 2020-10-11 ENCOUNTER — Other Ambulatory Visit: Payer: Medicare HMO

## 2020-10-11 ENCOUNTER — Encounter: Payer: Self-pay | Admitting: Internal Medicine

## 2020-10-11 ENCOUNTER — Inpatient Hospital Stay: Payer: Medicare HMO

## 2020-10-11 ENCOUNTER — Other Ambulatory Visit: Payer: Self-pay

## 2020-10-11 ENCOUNTER — Inpatient Hospital Stay (HOSPITAL_BASED_OUTPATIENT_CLINIC_OR_DEPARTMENT_OTHER): Payer: Medicare HMO | Admitting: Internal Medicine

## 2020-10-11 VITALS — BP 142/82 | HR 80 | Temp 96.8°F | Resp 19 | Ht 73.0 in | Wt 195.8 lb

## 2020-10-11 DIAGNOSIS — C3490 Malignant neoplasm of unspecified part of unspecified bronchus or lung: Secondary | ICD-10-CM | POA: Diagnosis not present

## 2020-10-11 DIAGNOSIS — Z95828 Presence of other vascular implants and grafts: Secondary | ICD-10-CM

## 2020-10-11 DIAGNOSIS — C799 Secondary malignant neoplasm of unspecified site: Secondary | ICD-10-CM

## 2020-10-11 DIAGNOSIS — C3491 Malignant neoplasm of unspecified part of right bronchus or lung: Secondary | ICD-10-CM

## 2020-10-11 DIAGNOSIS — Z5111 Encounter for antineoplastic chemotherapy: Secondary | ICD-10-CM | POA: Diagnosis not present

## 2020-10-11 LAB — CBC WITH DIFFERENTIAL (CANCER CENTER ONLY)
Abs Immature Granulocytes: 0.02 10*3/uL (ref 0.00–0.07)
Basophils Absolute: 0 10*3/uL (ref 0.0–0.1)
Basophils Relative: 1 %
Eosinophils Absolute: 0.1 10*3/uL (ref 0.0–0.5)
Eosinophils Relative: 2 %
HCT: 36.3 % — ABNORMAL LOW (ref 39.0–52.0)
Hemoglobin: 12.7 g/dL — ABNORMAL LOW (ref 13.0–17.0)
Immature Granulocytes: 0 %
Lymphocytes Relative: 22 %
Lymphs Abs: 1.2 10*3/uL (ref 0.7–4.0)
MCH: 30.8 pg (ref 26.0–34.0)
MCHC: 35 g/dL (ref 30.0–36.0)
MCV: 87.9 fL (ref 80.0–100.0)
Monocytes Absolute: 0.5 10*3/uL (ref 0.1–1.0)
Monocytes Relative: 9 %
Neutro Abs: 3.9 10*3/uL (ref 1.7–7.7)
Neutrophils Relative %: 66 %
Platelet Count: 301 10*3/uL (ref 150–400)
RBC: 4.13 MIL/uL — ABNORMAL LOW (ref 4.22–5.81)
RDW: 14.5 % (ref 11.5–15.5)
WBC Count: 5.8 10*3/uL (ref 4.0–10.5)
nRBC: 0 % (ref 0.0–0.2)

## 2020-10-11 LAB — CMP (CANCER CENTER ONLY)
ALT: 36 U/L (ref 0–44)
AST: 29 U/L (ref 15–41)
Albumin: 4.2 g/dL (ref 3.5–5.0)
Alkaline Phosphatase: 92 U/L (ref 38–126)
Anion gap: 9 (ref 5–15)
BUN: 18 mg/dL (ref 8–23)
CO2: 24 mmol/L (ref 22–32)
Calcium: 9.5 mg/dL (ref 8.9–10.3)
Chloride: 108 mmol/L (ref 98–111)
Creatinine: 0.8 mg/dL (ref 0.61–1.24)
GFR, Estimated: >60 mL/min (ref >60)
Glucose, Bld: 112 mg/dL — ABNORMAL HIGH (ref 70–99)
Potassium: 4.2 mmol/L (ref 3.5–5.1)
Sodium: 141 mmol/L (ref 135–145)
Total Bilirubin: 0.8 mg/dL (ref 0.3–1.2)
Total Protein: 7.9 g/dL (ref 6.5–8.1)

## 2020-10-11 LAB — TSH: TSH: 1.966 u[IU]/mL (ref 0.320–4.118)

## 2020-10-11 MED ORDER — SODIUM CHLORIDE 0.9 % IV SOLN
Freq: Once | INTRAVENOUS | Status: AC
  Filled 2020-10-11: qty 250

## 2020-10-11 MED ORDER — SODIUM CHLORIDE 0.9 % IV SOLN
350.0000 mg | Freq: Once | INTRAVENOUS | Status: AC
  Administered 2020-10-11: 350 mg via INTRAVENOUS
  Filled 2020-10-11: qty 7

## 2020-10-11 MED ORDER — SODIUM CHLORIDE 0.9% FLUSH
10.0000 mL | Freq: Once | INTRAVENOUS | Status: AC
  Administered 2020-10-11: 10 mL
  Filled 2020-10-11: qty 10

## 2020-10-11 NOTE — Progress Notes (Signed)
[name]   [phone]  OFFICE PROGRESS NOTE  Jean Rosenthal, MD [address] [place] [address]  DIAGNOSIS: stage IV (TX, N2, M1 C) non-small cell lung cancer, adenocarcinoma diagnosed in August 2021 and presented with solitary brain metastasis in addition to mediastinal lymphadenopathy.  PDL1 Expression 70%  Molecular Biomarkers:  Tumor Mutational Burden - 52 Muts/Mb Microsatellite status - MS-Stable Genomic Findings For a complete list of the genes assayed, please refer to the Appendix. NF1 E1694* MTAP loss exons 2-8 RICTOR amplification ATRX A419V BRAF K483E CDKN2A/B CDKN2A loss, CDKN2B loss DNMT3A E205* FGF10 amplification NTRK1 amplification - equivocal? 7 Disease relevant genes with no reportable alterations: ALK, EGFR, ERBB2, KRAS, MET, RET, ROS1   PRIOR THERAPY:  1) Status post right craniotomy with tumor resection followed by [facility] to solitary brain metastasis under the care of Dr. Lisbeth Renshaw and Dr. Sherral Hammers. 2) Concurrent chemoradiation with weekly carboplatin for AUC of 2 and paclitaxel 45 mg/M2. First dose 02/01/2020.Status post 5 cycles. Last dose was given February 29, 2020.  CURRENT THERAPY:  First-line treatment with immunotherapy with Libtayo (Cempilimab) 350 mg IV every 3 weeks.  First dose April 25, 2020.  Status post 7 cycles.  INTERVAL HISTORY: Jared Shea. 65 y.o. male returns to the clinic today for follow-up visit.  The patient is feeling fine with no concerning complaints except for the persistent arthralgia mainly in the knees and shoulder.  He takes ibuprofen on as-needed basis.  He also was given prescription for Voltaren cream by his primary care physician.  He denied having any current chest pain, shortness of breath, cough or hemoptysis.  He has no fever or chills.  He denied having any significant weight loss or night sweats.  He is here today for evaluation before starting  cycle #8 of his treatment.   MEDICAL HISTORY: Past Medical History:  Diagnosis Date  . Brain mass   . Bursitis of right hip   . Chest pain 08/12/2018  . Chronic cough   . Dependence on nicotine from cigarettes   . Diabetes mellitus without complication ([place])   . Essential hypertension 07/28/2018  . nscl ca dx'd 12/2019  . Seizures ([place])   . Viral illness 03/23/2019    ALLERGIES:  is allergic to penicillins.  MEDICATIONS:  Current Outpatient Medications  Medication Sig Dispense Refill  . diclofenac Sodium (VOLTAREN) 1 % GEL Apply 2 g topically 4 (four) times daily. 350 g 3  . ibuprofen (ADVIL) 200 MG tablet Take 400 mg by mouth every 6 (six) hours as needed for mild pain.    Marland Kitchen levETIRAcetam (KEPPRA) 750 MG tablet Take 1 tablet (750 mg total) by mouth 2 (two) times daily. 180 tablet 4  . losartan (COZAAR) 25 MG tablet Take 1 tablet (25 mg total) by mouth daily. 30 tablet 11  . Melatonin 10 MG TABS Take 10 mg by mouth at bedtime as needed (sleep).    . nortriptyline (PAMELOR) 25 MG capsule Take 2 capsules (50 mg total) by mouth at bedtime. 60 capsule 11  . pseudoephedrine-acetaminophen (TYLENOL SINUS) 30-500 MG TABS tablet Take 1 tablet by mouth every 4 (four) hours as needed (sinus pressure).    . SUMAtriptan (IMITREX) 25 MG tablet Take 1 tablet (25 mg total) by mouth every 2 (two) hours as needed for migraine. May repeat in 2 hours if headache persists or recurs. 10 tablet 6   No current facility-administered medications for  this visit.    SURGICAL HISTORY:  Past Surgical History:  Procedure Laterality Date  . APPLICATION OF CRANIAL NAVIGATION N/A 01/07/2020   Procedure: APPLICATION OF CRANIAL NAVIGATION;  Surgeon: Ashok Pall, MD;  Location: [facility];  Service: Neurosurgery;  Laterality: N/A;  . CRANIOTOMY Right 01/07/2020   Procedure: RIGHT CRANIOTOMY FOR TUMOR RESECTION;  Surgeon: Ashok Pall, MD;  Location: [facility];  Service: Neurosurgery;  Laterality: Right;  rm 21  .  ENDOBRONCHIAL ULTRASOUND N/A 12/21/2019   Procedure: ENDOBRONCHIAL ULTRASOUND;  Surgeon: Laurin Coder, MD;  Location: [facility];  Service: Pulmonary;  Laterality: N/A;  . FINE NEEDLE ASPIRATION  12/21/2019   Procedure: FINE NEEDLE ASPIRATION (FNA) LINEAR;  Surgeon: Laurin Coder, MD;  Location: [facility];  Service: Pulmonary;;  . IR IMAGING GUIDED PORT INSERTION  03/03/2020  . NO PAST SURGERIES    . VIDEO BRONCHOSCOPY N/A 12/21/2019   Procedure: VIDEO BRONCHOSCOPY WITHOUT FLUORO;  Surgeon: Laurin Coder, MD;  Location: [facility];  Service: Pulmonary;  Laterality: N/A;    REVIEW OF SYSTEMS:  A comprehensive review of systems was negative except for: Constitutional: positive for fatigue Musculoskeletal: positive for arthralgias   PHYSICAL EXAMINATION: General appearance: alert, cooperative and no distress Head: Normocephalic, without obvious abnormality, atraumatic Neck: no adenopathy, no JVD, supple, symmetrical, trachea midline and thyroid not enlarged, symmetric, no tenderness/mass/nodules Lymph nodes: Cervical, supraclavicular, and axillary nodes normal. Resp: clear to auscultation bilaterally Back: symmetric, no curvature. ROM normal. No CVA tenderness. Cardio: regular rate and rhythm, S1, S2 normal, no murmur, click, rub or gallop GI: soft, non-tender; bowel sounds normal; no masses,  no organomegaly Extremities: extremities normal, atraumatic, no cyanosis or edema  ECOG PERFORMANCE STATUS: 1 - Symptomatic but completely ambulatory  Blood pressure (!) 142/82, pulse 80, temperature (!) 96.8 F (36 C), temperature source Tympanic, resp. rate 19, height '6\' 1"'  (1.854 m), weight 195 lb 12.8 oz (88.8 kg), SpO2 100 %.  LABORATORY DATA: Lab Results  Component Value Date   WBC 5.5 09/22/2020   HGB 12.8 (L) 09/22/2020   HCT 37.1 (L) 09/22/2020   MCV 88.5 09/22/2020   PLT 298 09/22/2020      Chemistry      Component Value Date/Time   NA 139 09/22/2020 1004   NA  140 11/23/2019 1516   K 3.9 09/22/2020 1004   CL 105 09/22/2020 1004   CO2 25 09/22/2020 1004   BUN 17 09/22/2020 1004   BUN 14 11/23/2019 1516   CREATININE 0.78 09/22/2020 1004      Component Value Date/Time   CALCIUM 9.4 09/22/2020 1004   ALKPHOS 97 09/22/2020 1004   AST 29 09/22/2020 1004   ALT 43 09/22/2020 1004   BILITOT 0.7 09/22/2020 1004       RADIOGRAPHIC STUDIES: CT Chest W Contrast  Result Date: 09/17/2020 CLINICAL DATA:  Non-small cell lung cancer, chemotherapy ongoing, XRT complete. Cough, intermittent shortness of breath. EXAM: CT CHEST, ABDOMEN, AND PELVIS WITH CONTRAST TECHNIQUE: Multidetector CT imaging of the chest, abdomen and pelvis was performed following the standard protocol during bolus administration of intravenous contrast. CONTRAST:  142m OMNIPAQUE IOHEXOL 300 MG/ML  SOLN COMPARISON:  CT chest dated 07/05/2020. CT abdomen/pelvis dated 02/24/2020. FINDINGS: CT CHEST FINDINGS Cardiovascular: Heart is normal in size.  No pericardial effusion. No evidence of thoracic aortic aneurysm. Mild atherosclerotic calcifications of the aortic arch/root. Mild coronary atherosclerosis of the LAD and left circumflex. Right chest port terminates at the cavoatrial junction. Mediastinum/Nodes: Small mediastinal lymph nodes,  including a dominant 7 mm short axis low right paratracheal node (series 2/image 25) and a 6 mm short axis subcarinal node (series 2/image 33), within normal limits. Abnormal soft tissue in the left infrahilar region now measures 8 mm short axis (series 2/image 37), previously 9 mm. This corresponds to the site of the patient's known prior FDG avid tumor. Visualized thyroid is unremarkable. Lungs/Pleura: Mild centrilobular and paraseptal emphysematous changes, upper lung predominant. Radiation changes in the bilateral paramediastinal regions and medial right middle lobe. Prior nodular opacities in the anterior left upper lobe (series 6/image 65) and lateral left lower  lobe series 6/image 105) are more vague on the current study, possibly reflecting post infectious/inflammatory scarring. No suspicious pulmonary nodules. No focal consolidation. Trace left pleural fluid (series 2/image 37).  No pneumothorax. Musculoskeletal: No focal osseous lesions. CT ABDOMEN PELVIS FINDINGS Hepatobiliary: Liver is within normal limits. Gallbladder is unremarkable. No intrahepatic or extrahepatic ductal dilatation. Pancreas: Within normal limits. Spleen: Within normal limits. Adrenals/Urinary Tract: Adrenal glands are within normal limits. Kidneys are within normal limits.  No hydronephrosis. Bladder is within normal limits. Stomach/Bowel: Stomach is within normal limits. No evidence of bowel obstruction. Normal appendix (series 2/image 94). Mild left colonic diverticulosis, without evidence of diverticulitis. Vascular/Lymphatic: No evidence of abdominal aortic aneurysm. Atherosclerotic calcifications of the abdominal aorta and branch vessels. Dominant 18 mm short axis portacaval node (series 2/image 66), unchanged. Additional upper abdominal nodes in the porta hepatis measuring up to 17 mm short axis (series 2/image 64). Small retroperitoneal nodes measuring up to 7 mm short axis (series 2/image 34), within normal limits. Reproductive: Prostate is unremarkable, noting dystrophic calcifications. Other: Tiny fat containing right inguinal hernia. Small fat containing left inguinal hernia (series 2/image 120). No abdominopelvic ascites. Musculoskeletal: Mild degenerative changes at L4-5. IMPRESSION: Radiation changes in the lungs bilaterally. Abnormal soft tissue in the left infrahilar region measures 8 mm, corresponding to the patient's prior FDG avid tumor, stable versus mildly improved. Small upper abdominal nodes measuring up to 18 mm short axis, similar to the prior. However, upper abdominal nodal metastases are not excluded. Consider PET-CT for further evaluation. Electronically Signed   By:  Julian Hy M.D.   On: 09/17/2020 10:52   CT Abdomen Pelvis W Contrast  Result Date: 09/17/2020 CLINICAL DATA:  Non-small cell lung cancer, chemotherapy ongoing, XRT complete. Cough, intermittent shortness of breath. EXAM: CT CHEST, ABDOMEN, AND PELVIS WITH CONTRAST TECHNIQUE: Multidetector CT imaging of the chest, abdomen and pelvis was performed following the standard protocol during bolus administration of intravenous contrast. CONTRAST:  144m OMNIPAQUE IOHEXOL 300 MG/ML  SOLN COMPARISON:  CT chest dated 07/05/2020. CT abdomen/pelvis dated 02/24/2020. FINDINGS: CT CHEST FINDINGS Cardiovascular: Heart is normal in size.  No pericardial effusion. No evidence of thoracic aortic aneurysm. Mild atherosclerotic calcifications of the aortic arch/root. Mild coronary atherosclerosis of the LAD and left circumflex. Right chest port terminates at the cavoatrial junction. Mediastinum/Nodes: Small mediastinal lymph nodes, including a dominant 7 mm short axis low right paratracheal node (series 2/image 25) and a 6 mm short axis subcarinal node (series 2/image 33), within normal limits. Abnormal soft tissue in the left infrahilar region now measures 8 mm short axis (series 2/image 37), previously 9 mm. This corresponds to the site of the patient's known prior FDG avid tumor. Visualized thyroid is unremarkable. Lungs/Pleura: Mild centrilobular and paraseptal emphysematous changes, upper lung predominant. Radiation changes in the bilateral paramediastinal regions and medial right middle lobe. Prior nodular opacities in the anterior left upper  lobe (series 6/image 65) and lateral left lower lobe series 6/image 105) are more vague on the current study, possibly reflecting post infectious/inflammatory scarring. No suspicious pulmonary nodules. No focal consolidation. Trace left pleural fluid (series 2/image 37).  No pneumothorax. Musculoskeletal: No focal osseous lesions. CT ABDOMEN PELVIS FINDINGS Hepatobiliary: Liver is  within normal limits. Gallbladder is unremarkable. No intrahepatic or extrahepatic ductal dilatation. Pancreas: Within normal limits. Spleen: Within normal limits. Adrenals/Urinary Tract: Adrenal glands are within normal limits. Kidneys are within normal limits.  No hydronephrosis. Bladder is within normal limits. Stomach/Bowel: Stomach is within normal limits. No evidence of bowel obstruction. Normal appendix (series 2/image 94). Mild left colonic diverticulosis, without evidence of diverticulitis. Vascular/Lymphatic: No evidence of abdominal aortic aneurysm. Atherosclerotic calcifications of the abdominal aorta and branch vessels. Dominant 18 mm short axis portacaval node (series 2/image 66), unchanged. Additional upper abdominal nodes in the porta hepatis measuring up to 17 mm short axis (series 2/image 64). Small retroperitoneal nodes measuring up to 7 mm short axis (series 2/image 34), within normal limits. Reproductive: Prostate is unremarkable, noting dystrophic calcifications. Other: Tiny fat containing right inguinal hernia. Small fat containing left inguinal hernia (series 2/image 120). No abdominopelvic ascites. Musculoskeletal: Mild degenerative changes at L4-5. IMPRESSION: Radiation changes in the lungs bilaterally. Abnormal soft tissue in the left infrahilar region measures 8 mm, corresponding to the patient's prior FDG avid tumor, stable versus mildly improved. Small upper abdominal nodes measuring up to 18 mm short axis, similar to the prior. However, upper abdominal nodal metastases are not excluded. Consider PET-CT for further evaluation. Electronically Signed   By: Julian Hy M.D.   On: 09/17/2020 10:52    ASSESSMENT AND PLAN: This is a very pleasant 65 years old African-American male recently diagnosed with a stage IV (TX, N2, M1c) non-small cell lung cancer, adenocarcinoma presented with solitary brain metastasis in addition to right hilar and mediastinal lymphadenopathy diagnosed in  August 2021.  The patient is status post right craniotomy with resection of the solitary brain metastasis with SRS. His PET scan showed no evidence of metastatic disease outside the chest. The patient was treated with a course of concurrent chemoradiation with weekly carboplatin and paclitaxel status post 5 cycles.  He tolerated his treatment well except for dysphagia and odynophagia. His PD-L1 expression is 70%. The patient started treatment with immunotherapy with Cemiplimab 350 mg IV every 3 weeks status post 7 cycles.  He has been tolerating this treatment well with no concerning adverse effect except for the arthralgia. I discussed with the patient the option of discontinuing his treatment if it is bothering his quality of life versus continuation with Libtayo (Cempilimab) and symptomatic management of the arthralgia.  The patient would like to continue with the treatment.  He will receive cycle #8 today For the arthralgia, he will continue on Tylenol and ibuprofen if needed. I will see him back for follow-up visit in 3 weeks for evaluation before the next cycle of his treatment. He was advised to call immediately if he has any other concerning symptoms in the interval. The patient voices understanding of current disease status and treatment options and is in agreement with the current care plan.  All questions were answered. The patient knows to call the clinic with any problems, questions or concerns. We can certainly see the patient much sooner if necessary.   Disclaimer: This note was dictated with voice recognition software. Similar sounding words can inadvertently be transcribed and may not be corrected upon review.

## 2020-10-11 NOTE — Patient Instructions (Signed)
[facility]  Discharge Instructions: Thank you for choosing [facility] to provide your oncology and hematology care.   If you have a lab appointment with the [facility], please go directly to the [facility] and check in at the registration area.   Wear comfortable clothing and clothing appropriate for easy access to any Portacath or PICC line.   We strive to give you quality time with your provider. You may need to reschedule your appointment if you arrive late (15 or more minutes).  Arriving late affects you and other patients whose appointments are after yours.  Also, if you miss three or more appointments without notifying the office, you may be dismissed from the clinic at the provider's discretion.      For prescription refill requests, have your pharmacy contact our office and allow 72 hours for refills to be completed.    Today you received the following chemotherapy and/or immunotherapy agents: Libtayo    To help prevent nausea and vomiting after your treatment, we encourage you to take your nausea medication as directed.  BELOW ARE SYMPTOMS THAT SHOULD BE REPORTED IMMEDIATELY: . *FEVER GREATER THAN 100.4 F (38 C) OR HIGHER . *CHILLS OR SWEATING . *NAUSEA AND VOMITING THAT IS NOT CONTROLLED WITH YOUR NAUSEA MEDICATION . *UNUSUAL SHORTNESS OF BREATH . *UNUSUAL BRUISING OR BLEEDING . *URINARY PROBLEMS (pain or burning when urinating, or frequent urination) . *BOWEL PROBLEMS (unusual diarrhea, constipation, pain near the anus) . TENDERNESS IN MOUTH AND THROAT WITH OR WITHOUT PRESENCE OF ULCERS (sore throat, sores in mouth, or a toothache) . UNUSUAL RASH, SWELLING OR PAIN  . UNUSUAL VAGINAL DISCHARGE OR ITCHING   Items with * indicate a potential emergency and should be followed up as soon as possible or go to the Emergency Department if any problems should occur.  Please show the CHEMOTHERAPY ALERT CARD or IMMUNOTHERAPY ALERT CARD  at check-in to the Emergency Department and triage nurse.  Should you have questions after your visit or need to cancel or reschedule your appointment, please contact [facility]  Dept: [phone]  and follow the prompts.  Office hours are 8:00 a.m. to 4:30 p.m. Monday - Friday. Please note that voicemails left after 4:00 p.m. may not be returned until the following business day.  We are closed weekends and major holidays. You have access to a nurse at all times for urgent questions. Please call the main number to the clinic Dept: [phone] and follow the prompts.   For any non-urgent questions, you may also contact your provider using MyChart. We now offer e-Visits for anyone 29 and older to request care online for non-urgent symptoms. For details visit [email or web address].   Also download the MyChart app! Go to the app store, search "MyChart", open the app, select [facility], and log in with your MyChart username and password.  Due to Covid, a mask is required upon entering the hospital/clinic. If you do not have a mask, one will be given to you upon arrival. For doctor visits, patients may have 1 support person aged 91 or older with them. For treatment visits, patients cannot have anyone with them due to current Covid guidelines and our immunocompromised population.

## 2020-10-11 NOTE — Progress Notes (Signed)
Met w/ pt regarding applying for the [employer].  The pt stated no one contacted him regarding that grant which I requested back in Sept 2021 so I went over what the grant covers, gave him the income requirement and an expense sheet.  He will bring his proof of income some time in the future.  He has my card for any questions or concerns he may have in the future.

## 2020-10-13 ENCOUNTER — Telehealth: Payer: Self-pay | Admitting: Internal Medicine

## 2020-10-13 ENCOUNTER — Encounter: Payer: Self-pay | Admitting: Internal Medicine

## 2020-10-13 NOTE — Progress Notes (Signed)
Pt is approved for the $1000 [employer] grant.  

## 2020-10-13 NOTE — Telephone Encounter (Signed)
Scheduled per los. Called and left msg. Mailed printout  °

## 2020-10-29 ENCOUNTER — Ambulatory Visit (HOSPITAL_COMMUNITY): Admission: RE | Admit: 2020-10-29 | Payer: Medicare HMO | Source: Home / Self Care

## 2020-10-29 ENCOUNTER — Other Ambulatory Visit: Payer: Self-pay

## 2020-10-29 ENCOUNTER — Emergency Department (HOSPITAL_COMMUNITY)
Admission: RE | Admit: 2020-10-29 | Discharge: 2020-10-29 | Disposition: A | Discharge status: Home / Self Care | Payer: Medicare HMO | Source: Ambulatory Visit | Attending: Internal Medicine | Admitting: Internal Medicine

## 2020-10-29 ENCOUNTER — Emergency Department (HOSPITAL_COMMUNITY)
Admission: EM | Admit: 2020-10-29 | Discharge: 2020-10-29 | Disposition: A | Discharge status: Home / Self Care | Payer: Medicare HMO | Attending: Internal Medicine | Admitting: Internal Medicine

## 2020-10-29 DIAGNOSIS — C7931 Secondary malignant neoplasm of brain: Secondary | ICD-10-CM

## 2020-10-29 MED ORDER — GADOBUTROL 1 MMOL/ML IV SOLN
9.0000 mL | Freq: Once | INTRAVENOUS | Status: AC | PRN
  Administered 2020-10-29: 9 mL via INTRAVENOUS

## 2020-10-31 ENCOUNTER — Inpatient Hospital Stay: Payer: Medicare HMO | Attending: Internal Medicine

## 2020-10-31 ENCOUNTER — Encounter: Payer: Self-pay | Admitting: Internal Medicine

## 2020-10-31 ENCOUNTER — Other Ambulatory Visit: Payer: Self-pay

## 2020-10-31 ENCOUNTER — Inpatient Hospital Stay (HOSPITAL_BASED_OUTPATIENT_CLINIC_OR_DEPARTMENT_OTHER): Payer: Medicare HMO | Admitting: Internal Medicine

## 2020-10-31 VITALS — BP 127/76 | HR 69 | Temp 97.0°F | Resp 20 | Wt 198.9 lb

## 2020-10-31 DIAGNOSIS — M2550 Pain in unspecified joint: Secondary | ICD-10-CM | POA: Insufficient documentation

## 2020-10-31 DIAGNOSIS — Z923 Personal history of irradiation: Secondary | ICD-10-CM | POA: Insufficient documentation

## 2020-10-31 DIAGNOSIS — Z79899 Other long term (current) drug therapy: Secondary | ICD-10-CM | POA: Diagnosis not present

## 2020-10-31 DIAGNOSIS — R1310 Dysphagia, unspecified: Secondary | ICD-10-CM | POA: Insufficient documentation

## 2020-10-31 DIAGNOSIS — Z5112 Encounter for antineoplastic immunotherapy: Secondary | ICD-10-CM | POA: Diagnosis present

## 2020-10-31 DIAGNOSIS — I10 Essential (primary) hypertension: Secondary | ICD-10-CM | POA: Diagnosis not present

## 2020-10-31 DIAGNOSIS — C3491 Malignant neoplasm of unspecified part of right bronchus or lung: Secondary | ICD-10-CM | POA: Insufficient documentation

## 2020-10-31 DIAGNOSIS — C7931 Secondary malignant neoplasm of brain: Secondary | ICD-10-CM | POA: Diagnosis not present

## 2020-10-31 DIAGNOSIS — E119 Type 2 diabetes mellitus without complications: Secondary | ICD-10-CM | POA: Insufficient documentation

## 2020-10-31 DIAGNOSIS — Z9221 Personal history of antineoplastic chemotherapy: Secondary | ICD-10-CM | POA: Diagnosis not present

## 2020-10-31 DIAGNOSIS — R569 Unspecified convulsions: Secondary | ICD-10-CM | POA: Diagnosis not present

## 2020-10-31 DIAGNOSIS — Z87891 Personal history of nicotine dependence: Secondary | ICD-10-CM | POA: Insufficient documentation

## 2020-10-31 DIAGNOSIS — R599 Enlarged lymph nodes, unspecified: Secondary | ICD-10-CM | POA: Insufficient documentation

## 2020-10-31 NOTE — Progress Notes (Signed)
[facility] [place], [place] [address] [phone]  Interval Evaluation  Date of Service: 10/31/20 Patient Name: Jared Shea. Patient MRN: [id number] Patient DOB: 02-May-1956 Provider: Ventura Sellers, MD  Identifying Statement:  Jared Gibbons. is a 65 y.o. male with Brain metastases [facility]) [C79.31]   Primary Cancer:  Oncologic History: Oncology History  Non-small cell carcinoma of lung, stage 4 w/ mets to brain ([place])  12/31/2019 Initial Diagnosis   Non-small cell carcinoma of lung, stage 4 w/ mets to brain ([place])    06/06/2020 Cancer Staging   Staging form: Lung, AJCC 8th Edition - Clinical: Stage IVB (cT0, cN2, cM1c) - Signed by Curt Bears, MD on 06/06/2020    Adenocarcinoma, metastatic ([place])  01/07/2020 Initial Diagnosis   Adenocarcinoma, metastatic ([place])    02/01/2020 - 03/07/2020 Chemotherapy   The patient had dexamethasone (DECADRON) 4 MG tablet, 8 mg, Oral, Daily, 1 of 1 cycle, Start date: --, End date: -- palonosetron (ALOXI) injection 0.25 mg, 0.25 mg, Intravenous,  Once, 5 of 7 cycles Administration: 0.25 mg (02/01/2020), 0.25 mg (02/08/2020), 0.25 mg (03/07/2020), 0.25 mg (02/15/2020), 0.25 mg (02/22/2020) CARBOplatin (PARAPLATIN) 220 mg in sodium chloride 0.9 % 250 mL chemo infusion, 220 mg (100 % of original dose 216.8 mg), Intravenous,  Once, 5 of 7 cycles Dose modification: 216.8 mg (original dose 216.8 mg, Cycle 1) Administration: 220 mg (02/01/2020), 220 mg (02/08/2020), 220 mg (03/07/2020), 220 mg (02/15/2020), 220 mg (02/22/2020) PACLitaxel (TAXOL) 90 mg in sodium chloride 0.9 % 250 mL chemo infusion (</= 80mg /m2), 45 mg/m2 = 90 mg, Intravenous,  Once, 5 of 7 cycles Administration: 90 mg (02/01/2020), 90 mg (02/08/2020), 90 mg (03/07/2020), 90 mg (02/15/2020), 90 mg (02/22/2020)   for chemotherapy treatment.     04/29/2020 -  Chemotherapy    Patient is on Treatment Plan: MELANOMA CEMIPLIMAB Q21D         CNS Oncologic History 01/05/20: Pre-op SRS [facility]) 01/07/20: Craniotomy, R frontal resection ([place]) 04/22/20: Salvage SRS to 3 targets   Interval History:  Jared Alvira. presents today for follow up after brain MRI.  Denies new or progressive neurologic changes today.  Currently undergoing this immunotherapy treatment with Dr. Julien Nordmann since 04/29/20.  Headaches are stable at this time.  H+P (04/18/20) Patient presents following recent brain MRI.  He had completed pre-op SRS and underwent craniotomy for R frontal metastasis, after initial presenting with focal seizures (facial twitching).  He denies focal neurologic complaints today, he is functional and independent.  He is undergoing chemotherapy with Dr. Julien Nordmann (completed [place] and taxol) without complication.  His gait is unassisted.  Denies any seizures following his initial presentation, compliant with Keppra.      Medications: Current Outpatient Medications on File Prior to Visit  Medication Sig Dispense Refill   diclofenac Sodium (VOLTAREN) 1 % GEL Apply 2 g topically 4 (four) times daily. 350 g 3   ibuprofen (ADVIL) 200 MG tablet Take 400 mg by mouth every 6 (six) hours as needed for mild pain.     levETIRAcetam (KEPPRA) 750 MG tablet Take 1 tablet (750 mg total) by mouth 2 (two) times daily. 180 tablet 4   losartan (COZAAR) 25 MG tablet Take 1 tablet (25 mg total) by mouth daily. 30 tablet 11   magnesium citrate SOLN Take 1 Bottle by mouth once.     Melatonin 10 MG TABS Take 10 mg by mouth at bedtime as needed (sleep).  nortriptyline (PAMELOR) 25 MG capsule Take 2 capsules (50 mg total) by mouth at bedtime. 60 capsule 11   pseudoephedrine-acetaminophen (TYLENOL SINUS) 30-500 MG TABS tablet Take 1 tablet by mouth every 4 (four) hours as needed (sinus pressure).     SUMAtriptan (IMITREX) 25 MG tablet Take 1 tablet (25 mg total) by mouth every 2 (two) hours as needed for migraine. May repeat in 2 hours if headache persists  or recurs. 10 tablet 6   No current facility-administered medications on file prior to visit.    Allergies:  Allergies  Allergen Reactions   Penicillins Rash    Did it involve swelling of the face/tongue/throat, SOB, or low BP? N Did it involve sudden or severe rash/hives, skin peeling, or any reaction on the inside of your mouth or nose? Y Did you need to seek medical attention at a hospital or doctor's office? Y When did it last happen?    childhood   If all above answers are "NO", may proceed with cephalosporin use.    Past Medical History:  Past Medical History:  Diagnosis Date   Brain mass    Bursitis of right hip    Chest pain 08/12/2018   Chronic cough    Dependence on nicotine from cigarettes    Diabetes mellitus without complication ([place])    Essential hypertension 07/28/2018   nscl ca dx'd 12/2019   Seizures ([place])    Viral illness 03/23/2019   Past Surgical History:  Past Surgical History:  Procedure Laterality Date   APPLICATION OF CRANIAL NAVIGATION N/A 01/07/2020   Procedure: APPLICATION OF CRANIAL NAVIGATION;  Surgeon: Ashok Pall, MD;  Location: [facility];  Service: Neurosurgery;  Laterality: N/A;   CRANIOTOMY Right 01/07/2020   Procedure: RIGHT CRANIOTOMY FOR TUMOR RESECTION;  Surgeon: Ashok Pall, MD;  Location: [facility];  Service: Neurosurgery;  Laterality: Right;  rm 21   ENDOBRONCHIAL ULTRASOUND N/A 12/21/2019   Procedure: ENDOBRONCHIAL ULTRASOUND;  Surgeon: Laurin Coder, MD;  Location: [facility];  Service: Pulmonary;  Laterality: N/A;   FINE NEEDLE ASPIRATION  12/21/2019   Procedure: FINE NEEDLE ASPIRATION (FNA) LINEAR;  Surgeon: Laurin Coder, MD;  Location: [facility];  Service: Pulmonary;;   IR IMAGING GUIDED PORT INSERTION  03/03/2020   NO PAST SURGERIES     VIDEO BRONCHOSCOPY N/A 12/21/2019   Procedure: VIDEO BRONCHOSCOPY WITHOUT FLUORO;  Surgeon: Laurin Coder, MD;  Location: [facility];  Service: Pulmonary;  Laterality: N/A;   Social  History:  Social History   Socioeconomic History   Marital status: Divorced    Spouse name: Not on file   Number of children: Not on file   Years of education: Not on file   Highest education level: Not on file  Occupational History   Not on file  Tobacco Use   Smoking status: Former    Packs/day: 1.00    Years: 48.00    Pack years: 48.00    Types: Cigarettes    Start date: 08/07/1970    Quit date: 12/15/2019    Years since quitting: 0.8   Smokeless tobacco: Never  Vaping Use   Vaping Use: Never used  Substance and Sexual Activity   Alcohol use: Not Currently   Drug use: Never   Sexual activity: Not on file    Comment: YES  Other Topics Concern   Not on file  Social History Narrative   Not on file   Social Determinants of Health   Financial Resource Strain: High Risk  Difficulty of Paying Living Expenses: Hard  Food Insecurity: No Food Insecurity   Worried About Charity fundraiser in the Last Year: Never true   Ran Out of Food in the Last Year: Never true  Transportation Needs: Unmet Transportation Needs   Lack of Transportation (Medical): Yes   Lack of Transportation (Non-Medical): Yes  Physical Activity: Not on file  Stress: Not on file  Social Connections: Not on file  Intimate Partner Violence: Not on file   Family History:  Family History  Problem Relation Age of Onset   Heart disease Mother        CABG   Kidney disease Sister    Diabetes Brother    Kidney disease Brother        KIDNEY TRANSPLANT   Hypertension Sister    Healthy Daughter    Healthy Daughter    Healthy Son     Review of Systems: Constitutional: Doesn't report fevers, chills or abnormal weight loss Eyes: Doesn't report blurriness of vision Ears, nose, mouth, throat, and face: Doesn't report sore throat Respiratory: Doesn't report cough, dyspnea or wheezes Cardiovascular: Doesn't report palpitation, chest discomfort  Gastrointestinal:  Doesn't report nausea, constipation,  diarrhea GU: Doesn't report incontinence Skin: Doesn't report skin rashes Neurological: Per HPI Musculoskeletal: diffuse joint pain Behavioral/Psych: Doesn't report anxiety  Physical Exam: Vitals:   10/31/20 1034  BP: 127/76  Pulse: 69  Resp: 20  Temp: (!) 97 F (36.1 C)  SpO2: 100%   KPS: 80. General: Alert, cooperative, pleasant, in no acute distress Head: Normal EENT: No conjunctival injection or scleral icterus.  Lungs: Resp effort normal Cardiac: Regular rate Abdomen: Non-distended abdomen Skin: No rashes cyanosis or petechiae. Extremities: No clubbing or edema  Neurologic Exam: Mental Status: Awake, alert, attentive to examiner. Oriented to self and environment. Language is fluent with intact comprehension.  Cranial Nerves: Visual acuity is grossly normal. Visual fields are full. Extra-ocular movements intact. No ptosis. Face is symmetric Motor: Tone and bulk are normal. Power is full in both arms and legs. Reflexes are symmetric, no pathologic reflexes present.  Sensory: Intact to light touch Gait: Normal.   Labs: I have reviewed the data as listed    Component Value Date/Time   NA 141 10/11/2020 1000   NA 140 11/23/2019 1516   K 4.2 10/11/2020 1000   CL 108 10/11/2020 1000   CO2 24 10/11/2020 1000   GLUCOSE 112 (H) 10/11/2020 1000   BUN 18 10/11/2020 1000   BUN 14 11/23/2019 1516   CREATININE 0.80 10/11/2020 1000   CALCIUM 9.5 10/11/2020 1000   PROT 7.9 10/11/2020 1000   ALBUMIN 4.2 10/11/2020 1000   AST 29 10/11/2020 1000   ALT 36 10/11/2020 1000   ALKPHOS 92 10/11/2020 1000   BILITOT 0.8 10/11/2020 1000   GFRNONAA >60 10/11/2020 1000   GFRAA >60 02/15/2020 1000   Lab Results  Component Value Date   WBC 5.8 10/11/2020   NEUTROABS 3.9 10/11/2020   HGB 12.7 (L) 10/11/2020   HCT 36.3 (L) 10/11/2020   MCV 87.9 10/11/2020   PLT 301 10/11/2020    Imaging:  [facility] Clinician Interpretation: I have personally reviewed the CNS images as listed.  My  interpretation, in the context of the patient's clinical presentation, is stable disease  MR BRAIN W WO CONTRAST  Result Date: 10/30/2020 CLINICAL DATA:  Brain/CNS neoplasm, assess treatment response. Metastatic small cell lung cancer. EXAM: MRI HEAD WITHOUT AND WITH CONTRAST TECHNIQUE: Multiplanar, multiecho pulse sequences of the  brain and surrounding structures were obtained without and with intravenous contrast. CONTRAST:  53mL GADAVIST GADOBUTROL 1 MMOL/ML IV SOLN COMPARISON:  07/23/2020. FINDINGS: BRAIN New Lesions: None. Larger lesions: None. Stable or Smaller lesions: None. Previously seen punctate focus of enhancement in the occipital horn of the right lateral ventricle is no longer present. Other Brain findings: Focal encephalomalacia in the posterior right frontal lobe subjacent to the craniotomy site is stable. Periventricular T2 signal changes are mildly advanced for age. No acute infarct or hemorrhage is present. The internal auditory canals are within normal limits. The brainstem and cerebellum are within normal limits. Vascular: Flow is present in the major intracranial arteries. Skull and upper cervical spine: The craniocervical junction is normal. Upper cervical spine is within normal limits. Marrow signal is unremarkable. Sinuses/Orbits: The paranasal sinuses and mastoid air cells are clear. The globes and orbits are within normal limits. IMPRESSION: 1. Stable postoperative changes of the posterior right frontal lobe. 2. No evidence for residual or recurrent tumor. Previously seen punctate area of enhancement is no longer present. 3. Stable atrophy and white matter disease. This likely reflects the sequela of chronic microvascular ischemia. Some changes may be secondary to prior radiation. Electronically Signed   By: San Morelle M.D.   On: 10/30/2020 20:28     Assessment/Plan Brain metastases ([place]) [C79.31]  Jared Simmonds. is clinically and radiographically stable today.  No new  or progressive neurologic deficits.  Headaches are well controlled with Pamelor.  We appreciate the opportunity to participate in the care of Jared Malter..    We ask that Jared Simmonds. return to clinic in 4 months following next brain MRI, or sooner as needed.  All questions were answered. The patient knows to call the clinic with any problems, questions or concerns. No barriers to learning were detected.  The total time spent in the encounter was 30 minutes and more than 50% was on counseling and review of test results   Ventura Sellers, MD Medical Director of Neuro-Oncology [facility] at [facility] 10/31/20 4:18 PM

## 2020-11-01 ENCOUNTER — Inpatient Hospital Stay: Payer: Medicare HMO

## 2020-11-01 ENCOUNTER — Inpatient Hospital Stay (HOSPITAL_BASED_OUTPATIENT_CLINIC_OR_DEPARTMENT_OTHER): Payer: Medicare HMO | Admitting: Internal Medicine

## 2020-11-01 VITALS — BP 130/72 | HR 99 | Temp 97.0°F | Resp 20 | Wt 198.9 lb

## 2020-11-01 DIAGNOSIS — C3490 Malignant neoplasm of unspecified part of unspecified bronchus or lung: Secondary | ICD-10-CM

## 2020-11-01 DIAGNOSIS — Z5112 Encounter for antineoplastic immunotherapy: Secondary | ICD-10-CM | POA: Diagnosis not present

## 2020-11-01 DIAGNOSIS — C3491 Malignant neoplasm of unspecified part of right bronchus or lung: Secondary | ICD-10-CM

## 2020-11-01 DIAGNOSIS — C799 Secondary malignant neoplasm of unspecified site: Secondary | ICD-10-CM

## 2020-11-01 LAB — CBC WITH DIFFERENTIAL (CANCER CENTER ONLY)
Abs Immature Granulocytes: 0.02 10*3/uL (ref 0.00–0.07)
Basophils Absolute: 0 10*3/uL (ref 0.0–0.1)
Basophils Relative: 1 %
Eosinophils Absolute: 0.2 10*3/uL (ref 0.0–0.5)
Eosinophils Relative: 3 %
HCT: 37.4 % — ABNORMAL LOW (ref 39.0–52.0)
Hemoglobin: 13.1 g/dL (ref 13.0–17.0)
Immature Granulocytes: 0 %
Lymphocytes Relative: 27 %
Lymphs Abs: 1.4 10*3/uL (ref 0.7–4.0)
MCH: 31.3 pg (ref 26.0–34.0)
MCHC: 35 g/dL (ref 30.0–36.0)
MCV: 89.3 fL (ref 80.0–100.0)
Monocytes Absolute: 0.5 10*3/uL (ref 0.1–1.0)
Monocytes Relative: 10 %
Neutro Abs: 3.2 10*3/uL (ref 1.7–7.7)
Neutrophils Relative %: 59 %
Platelet Count: 264 10*3/uL (ref 150–400)
RBC: 4.19 MIL/uL — ABNORMAL LOW (ref 4.22–5.81)
RDW: 13.5 % (ref 11.5–15.5)
WBC Count: 5.3 10*3/uL (ref 4.0–10.5)
nRBC: 0 % (ref 0.0–0.2)

## 2020-11-01 LAB — CMP (CANCER CENTER ONLY)
ALT: 33 U/L (ref 0–44)
AST: 26 U/L (ref 15–41)
Albumin: 4.4 g/dL (ref 3.5–5.0)
Alkaline Phosphatase: 85 U/L (ref 38–126)
Anion gap: 7 (ref 5–15)
BUN: 11 mg/dL (ref 8–23)
CO2: 26 mmol/L (ref 22–32)
Calcium: 9.2 mg/dL (ref 8.9–10.3)
Chloride: 107 mmol/L (ref 98–111)
Creatinine: 0.72 mg/dL (ref 0.61–1.24)
GFR, Estimated: >60 mL/min (ref >60)
Glucose, Bld: 138 mg/dL — ABNORMAL HIGH (ref 70–99)
Potassium: 3.7 mmol/L (ref 3.5–5.1)
Sodium: 140 mmol/L (ref 135–145)
Total Bilirubin: 0.4 mg/dL (ref 0.3–1.2)
Total Protein: 8.1 g/dL (ref 6.5–8.1)

## 2020-11-01 LAB — TSH: TSH: 1.637 u[IU]/mL (ref 0.350–4.500)

## 2020-11-01 MED ORDER — SODIUM CHLORIDE 0.9% FLUSH
10.0000 mL | INTRAVENOUS | Status: DC | PRN
  Administered 2020-11-01: 10 mL
  Filled 2020-11-01: qty 10

## 2020-11-01 MED ORDER — SODIUM CHLORIDE 0.9 % IV SOLN
Freq: Once | INTRAVENOUS | Status: AC
  Filled 2020-11-01: qty 250

## 2020-11-01 MED ORDER — HEPARIN SOD (PORK) LOCK FLUSH 100 UNIT/ML IV SOLN
500.0000 [IU] | Freq: Once | INTRAVENOUS | Status: AC | PRN
  Administered 2020-11-01: 500 [IU]
  Filled 2020-11-01: qty 5

## 2020-11-01 MED ORDER — SODIUM CHLORIDE 0.9 % IV SOLN
350.0000 mg | Freq: Once | INTRAVENOUS | Status: AC
  Administered 2020-11-01: 350 mg via INTRAVENOUS
  Filled 2020-11-01: qty 7

## 2020-11-01 NOTE — Patient Instructions (Signed)
[facility] ONCOLOGY  Discharge Instructions: Thank you for choosing [facility] to provide your oncology and hematology care.   If you have a lab appointment with the [facility], please go directly to the [facility] and check in at the registration area.   Wear comfortable clothing and clothing appropriate for easy access to any Portacath or PICC line.   We strive to give you quality time with your provider. You may need to reschedule your appointment if you arrive late (15 or more minutes).  Arriving late affects you and other patients whose appointments are after yours.  Also, if you miss three or more appointments without notifying the office, you may be dismissed from the clinic at the provider's discretion.      For prescription refill requests, have your pharmacy contact our office and allow 72 hours for refills to be completed.    Today you received the following chemotherapy and/or immunotherapy agents: Libtayo    To help prevent nausea and vomiting after your treatment, we encourage you to take your nausea medication as directed.  BELOW ARE SYMPTOMS THAT SHOULD BE REPORTED IMMEDIATELY: *FEVER GREATER THAN 100.4 F (38 C) OR HIGHER *CHILLS OR SWEATING *NAUSEA AND VOMITING THAT IS NOT CONTROLLED WITH YOUR NAUSEA MEDICATION *UNUSUAL SHORTNESS OF BREATH *UNUSUAL BRUISING OR BLEEDING *URINARY PROBLEMS (pain or burning when urinating, or frequent urination) *BOWEL PROBLEMS (unusual diarrhea, constipation, pain near the anus) TENDERNESS IN MOUTH AND THROAT WITH OR WITHOUT PRESENCE OF ULCERS (sore throat, sores in mouth, or a toothache) UNUSUAL RASH, SWELLING OR PAIN  UNUSUAL VAGINAL DISCHARGE OR ITCHING   Items with * indicate a potential emergency and should be followed up as soon as possible or go to the Emergency Department if any problems should occur.  Please show the CHEMOTHERAPY ALERT CARD or IMMUNOTHERAPY ALERT CARD at check-in to the  Emergency Department and triage nurse.  Should you have questions after your visit or need to cancel or reschedule your appointment, please contact [facility]  Dept: [phone]  and follow the prompts.  Office hours are 8:00 a.m. to 4:30 p.m. Monday - Friday. Please note that voicemails left after 4:00 p.m. may not be returned until the following business day.  We are closed weekends and major holidays. You have access to a nurse at all times for urgent questions. Please call the main number to the clinic Dept: [phone] and follow the prompts.   For any non-urgent questions, you may also contact your provider using MyChart. We now offer e-Visits for anyone 67 and older to request care online for non-urgent symptoms. For details visit [email or web address].   Also download the MyChart app! Go to the app store, search "MyChart", open the app, select [facility], and log in with your MyChart username and password.  Due to Covid, a mask is required upon entering the hospital/clinic. If you do not have a mask, one will be given to you upon arrival. For doctor visits, patients may have 1 support person aged 67 or older with them. For treatment visits, patients cannot have anyone with them due to current Covid guidelines and our immunocompromised population.

## 2020-11-01 NOTE — Progress Notes (Signed)
Okay to treat with CMP pending today per Dr. Julien Nordmann.

## 2020-11-01 NOTE — Progress Notes (Signed)
[place] [phone]   [phone]  OFFICE PROGRESS NOTE  Jean Rosenthal, MD [address] [place] [address]  DIAGNOSIS: stage IV (TX, N2, M1 C) non-small cell lung cancer, adenocarcinoma diagnosed in August 2021 and presented with solitary brain metastasis in addition to mediastinal lymphadenopathy.  PDL1 Expression 70%   Molecular Biomarkers:  Tumor Mutational Burden - 52 Muts/Mb Microsatellite status - MS-Stable Genomic Findings For a complete list of the genes assayed, please refer to the Appendix. NF1 E1694* MTAP loss exons 2-8 RICTOR amplification ATRX A419V BRAF K483E CDKN2A/B CDKN2A loss, CDKN2B loss DNMT3A E205* FGF10 amplification NTRK1 amplification - equivocal? 7 Disease relevant genes with no reportable alterations: ALK, EGFR, ERBB2, KRAS, MET, RET, ROS1    PRIOR THERAPY:  1) Status post right craniotomy with tumor resection followed by [facility] to solitary brain metastasis under the care of Dr. Lisbeth Renshaw and Dr. Sherral Hammers. 2) Concurrent chemoradiation with weekly carboplatin for AUC of 2 and paclitaxel 45 mg/M2.  First dose 02/01/2020. Status post 5 cycles.  Last dose was given February 29, 2020.   CURRENT THERAPY:  First-line treatment with immunotherapy with Libtayo (Cempilimab) 350 mg IV every 3 weeks.  First dose April 25, 2020.  Status post 8 cycles.  INTERVAL HISTORY: Jared Shea. 65 y.o. male returns to the clinic today for follow-up visit.  The patient is feeling fine today with no concerning complaints except for the persistent arthritis.  He denied having any chest pain, shortness of breath, cough or hemoptysis.  He denied having any fever or chills.  He has no nausea, vomiting, diarrhea or constipation.  He has no headache or visual changes.  He continues to tolerate his treatment with immunotherapy fairly well except for the arthralgia.  The patient is here today for evaluation before starting cycle  #9.   MEDICAL HISTORY: Past Medical History:  Diagnosis Date   Brain mass    Bursitis of right hip    Chest pain 08/12/2018   Chronic cough    Dependence on nicotine from cigarettes    Diabetes mellitus without complication ([place])    Essential hypertension 07/28/2018   nscl ca dx'd 12/2019   Seizures ([place])    Viral illness 03/23/2019    ALLERGIES:  is allergic to penicillins.  MEDICATIONS:  Current Outpatient Medications  Medication Sig Dispense Refill   diclofenac Sodium (VOLTAREN) 1 % GEL Apply 2 g topically 4 (four) times daily. 350 g 3   ibuprofen (ADVIL) 200 MG tablet Take 400 mg by mouth every 6 (six) hours as needed for mild pain.     levETIRAcetam (KEPPRA) 750 MG tablet Take 1 tablet (750 mg total) by mouth 2 (two) times daily. 180 tablet 4   losartan (COZAAR) 25 MG tablet Take 1 tablet (25 mg total) by mouth daily. 30 tablet 11   magnesium citrate SOLN Take 1 Bottle by mouth once.     Melatonin 10 MG TABS Take 10 mg by mouth at bedtime as needed (sleep).     nortriptyline (PAMELOR) 25 MG capsule Take 2 capsules (50 mg total) by mouth at bedtime. 60 capsule 11   pseudoephedrine-acetaminophen (TYLENOL SINUS) 30-500 MG TABS tablet Take 1 tablet by mouth every 4 (four) hours as needed (sinus pressure).     SUMAtriptan (IMITREX) 25 MG tablet Take 1 tablet (25 mg total) by mouth every 2 (two) hours as needed for migraine. May repeat in 2 hours if headache persists  or recurs. 10 tablet 6   No current facility-administered medications for this visit.    SURGICAL HISTORY:  Past Surgical History:  Procedure Laterality Date   APPLICATION OF CRANIAL NAVIGATION N/A 01/07/2020   Procedure: APPLICATION OF CRANIAL NAVIGATION;  Surgeon: Ashok Pall, MD;  Location: [facility];  Service: Neurosurgery;  Laterality: N/A;   CRANIOTOMY Right 01/07/2020   Procedure: RIGHT CRANIOTOMY FOR TUMOR RESECTION;  Surgeon: Ashok Pall, MD;  Location: [facility];  Service: Neurosurgery;  Laterality: Right;  rm 21    ENDOBRONCHIAL ULTRASOUND N/A 12/21/2019   Procedure: ENDOBRONCHIAL ULTRASOUND;  Surgeon: Laurin Coder, MD;  Location: [facility];  Service: Pulmonary;  Laterality: N/A;   FINE NEEDLE ASPIRATION  12/21/2019   Procedure: FINE NEEDLE ASPIRATION (FNA) LINEAR;  Surgeon: Laurin Coder, MD;  Location: [facility];  Service: Pulmonary;;   IR IMAGING GUIDED PORT INSERTION  03/03/2020   NO PAST SURGERIES     VIDEO BRONCHOSCOPY N/A 12/21/2019   Procedure: VIDEO BRONCHOSCOPY WITHOUT FLUORO;  Surgeon: Laurin Coder, MD;  Location: [facility];  Service: Pulmonary;  Laterality: N/A;    REVIEW OF SYSTEMS:  A comprehensive review of systems was negative except for: Constitutional: positive for fatigue Musculoskeletal: positive for arthralgias   PHYSICAL EXAMINATION: General appearance: alert, cooperative, and no distress Head: Normocephalic, without obvious abnormality, atraumatic Neck: no adenopathy, no JVD, supple, symmetrical, trachea midline, and thyroid not enlarged, symmetric, no tenderness/mass/nodules Lymph nodes: Cervical, supraclavicular, and axillary nodes normal. Resp: clear to auscultation bilaterally Back: symmetric, no curvature. ROM normal. No CVA tenderness. Cardio: regular rate and rhythm, S1, S2 normal, no murmur, click, rub or gallop GI: soft, non-tender; bowel sounds normal; no masses,  no organomegaly Extremities: extremities normal, atraumatic, no cyanosis or edema  ECOG PERFORMANCE STATUS: 1 - Symptomatic but completely ambulatory  Blood pressure 130/72, pulse 99, temperature (!) 97 F (36.1 C), temperature source Oral, resp. rate 20, weight 198 lb 14.4 oz (90.2 kg), SpO2 100 %.  LABORATORY DATA: Lab Results  Component Value Date   WBC 5.8 10/11/2020   HGB 12.7 (L) 10/11/2020   HCT 36.3 (L) 10/11/2020   MCV 87.9 10/11/2020   PLT 301 10/11/2020      Chemistry      Component Value Date/Time   NA 141 10/11/2020 1000   NA 140 11/23/2019 1516   K 4.2  10/11/2020 1000   CL 108 10/11/2020 1000   CO2 24 10/11/2020 1000   BUN 18 10/11/2020 1000   BUN 14 11/23/2019 1516   CREATININE 0.80 10/11/2020 1000      Component Value Date/Time   CALCIUM 9.5 10/11/2020 1000   ALKPHOS 92 10/11/2020 1000   AST 29 10/11/2020 1000   ALT 36 10/11/2020 1000   BILITOT 0.8 10/11/2020 1000       RADIOGRAPHIC STUDIES: MR BRAIN W WO CONTRAST  Result Date: 10/30/2020 CLINICAL DATA:  Brain/CNS neoplasm, assess treatment response. Metastatic small cell lung cancer. EXAM: MRI HEAD WITHOUT AND WITH CONTRAST TECHNIQUE: Multiplanar, multiecho pulse sequences of the brain and surrounding structures were obtained without and with intravenous contrast. CONTRAST:  76m GADAVIST GADOBUTROL 1 MMOL/ML IV SOLN COMPARISON:  07/23/2020. FINDINGS: BRAIN New Lesions: None. Larger lesions: None. Stable or Smaller lesions: None. Previously seen punctate focus of enhancement in the occipital horn of the right lateral ventricle is no longer present. Other Brain findings: Focal encephalomalacia in the posterior right frontal lobe subjacent to the craniotomy site is stable. Periventricular T2 signal changes are mildly advanced  for age. No acute infarct or hemorrhage is present. The internal auditory canals are within normal limits. The brainstem and cerebellum are within normal limits. Vascular: Flow is present in the major intracranial arteries. Skull and upper cervical spine: The craniocervical junction is normal. Upper cervical spine is within normal limits. Marrow signal is unremarkable. Sinuses/Orbits: The paranasal sinuses and mastoid air cells are clear. The globes and orbits are within normal limits. IMPRESSION: 1. Stable postoperative changes of the posterior right frontal lobe. 2. No evidence for residual or recurrent tumor. Previously seen punctate area of enhancement is no longer present. 3. Stable atrophy and white matter disease. This likely reflects the sequela of chronic  microvascular ischemia. Some changes may be secondary to prior radiation. Electronically Signed   By: San Morelle M.D.   On: 10/30/2020 20:28     ASSESSMENT AND PLAN: This is a very pleasant 65 years old African-American male recently diagnosed with a stage IV (TX, N2, M1c) non-small cell lung cancer, adenocarcinoma presented with solitary brain metastasis in addition to right hilar and mediastinal lymphadenopathy diagnosed in August 2021.  The patient is status post right craniotomy with resection of the solitary brain metastasis with SRS. His PET scan showed no evidence of metastatic disease outside the chest. The patient was treated with a course of concurrent chemoradiation with weekly carboplatin and paclitaxel status post 5 cycles.  He tolerated his treatment well except for dysphagia and odynophagia. His PD-L1 expression is 70%. The patient started treatment with immunotherapy with Cemiplimab 350 mg IV every 3 weeks status post 8 cycles.   The patient has been tolerating this treatment fairly well with no concerning adverse effects except for the arthralgia. I recommended for him to proceed with cycle #9 today as planned. I will see him back for follow-up visit in 3 weeks for evaluation with repeat CT scan of the chest, abdomen pelvis for restaging of his disease. The patient was advised to call immediately if he has any concerning symptoms in the interval. The patient voices understanding of current disease status and treatment options and is in agreement with the current care plan.  All questions were answered. The patient knows to call the clinic with any problems, questions or concerns. We can certainly see the patient much sooner if necessary.   Disclaimer: This note was dictated with voice recognition software. Similar sounding words can inadvertently be transcribed and may not be corrected upon review.

## 2020-11-09 ENCOUNTER — Other Ambulatory Visit: Payer: Self-pay | Admitting: Radiation Therapy

## 2020-11-22 ENCOUNTER — Other Ambulatory Visit: Payer: Self-pay

## 2020-11-22 ENCOUNTER — Encounter (HOSPITAL_COMMUNITY): Payer: Self-pay

## 2020-11-22 ENCOUNTER — Ambulatory Visit (HOSPITAL_COMMUNITY)
Admission: RE | Admit: 2020-11-22 | Discharge: 2020-11-22 | Disposition: A | Discharge status: Home / Self Care | Payer: Medicare HMO | Source: Ambulatory Visit | Attending: Internal Medicine | Admitting: Internal Medicine

## 2020-11-22 DIAGNOSIS — C3490 Malignant neoplasm of unspecified part of unspecified bronchus or lung: Secondary | ICD-10-CM

## 2020-11-22 MED ORDER — IOHEXOL 350 MG/ML SOLN
100.0000 mL | Freq: Once | INTRAVENOUS | Status: AC | PRN
  Administered 2020-11-22: 100 mL via INTRAVENOUS

## 2020-11-22 MED ORDER — HEPARIN SOD (PORK) LOCK FLUSH 100 UNIT/ML IV SOLN
INTRAVENOUS | Status: AC
  Filled 2020-11-22: qty 5

## 2020-11-22 MED ORDER — SODIUM CHLORIDE (PF) 0.9 % IJ SOLN
INTRAMUSCULAR | Status: AC
  Filled 2020-11-22: qty 50

## 2020-11-22 MED ORDER — HEPARIN SOD (PORK) LOCK FLUSH 100 UNIT/ML IV SOLN: 500.0000 [IU] | Freq: Once | INTRAVENOUS | Status: DC

## 2020-11-22 MED ORDER — HEPARIN SOD (PORK) LOCK FLUSH 100 UNIT/ML IV SOLN
500.0000 [IU] | Freq: Once | INTRAVENOUS | Status: AC
  Administered 2020-11-22: 500 [IU] via INTRAVENOUS

## 2020-11-23 ENCOUNTER — Encounter: Payer: Self-pay | Admitting: Internal Medicine

## 2020-11-23 ENCOUNTER — Inpatient Hospital Stay: Payer: Medicare HMO

## 2020-11-23 ENCOUNTER — Inpatient Hospital Stay: Payer: Medicare HMO | Attending: Internal Medicine | Admitting: Internal Medicine

## 2020-11-23 VITALS — BP 147/79 | HR 83 | Temp 96.9°F | Resp 20 | Ht 73.0 in | Wt 201.7 lb

## 2020-11-23 DIAGNOSIS — Z79899 Other long term (current) drug therapy: Secondary | ICD-10-CM | POA: Insufficient documentation

## 2020-11-23 DIAGNOSIS — C7931 Secondary malignant neoplasm of brain: Secondary | ICD-10-CM

## 2020-11-23 DIAGNOSIS — Z5111 Encounter for antineoplastic chemotherapy: Secondary | ICD-10-CM | POA: Insufficient documentation

## 2020-11-23 DIAGNOSIS — I10 Essential (primary) hypertension: Secondary | ICD-10-CM | POA: Diagnosis not present

## 2020-11-23 DIAGNOSIS — C3491 Malignant neoplasm of unspecified part of right bronchus or lung: Secondary | ICD-10-CM

## 2020-11-23 DIAGNOSIS — J984 Other disorders of lung: Secondary | ICD-10-CM | POA: Insufficient documentation

## 2020-11-23 DIAGNOSIS — C3490 Malignant neoplasm of unspecified part of unspecified bronchus or lung: Secondary | ICD-10-CM

## 2020-11-23 DIAGNOSIS — M2550 Pain in unspecified joint: Secondary | ICD-10-CM | POA: Diagnosis not present

## 2020-11-23 DIAGNOSIS — E119 Type 2 diabetes mellitus without complications: Secondary | ICD-10-CM | POA: Diagnosis not present

## 2020-11-23 DIAGNOSIS — I700 Atherosclerosis of aorta: Secondary | ICD-10-CM | POA: Diagnosis not present

## 2020-11-23 DIAGNOSIS — C3411 Malignant neoplasm of upper lobe, right bronchus or lung: Secondary | ICD-10-CM | POA: Diagnosis not present

## 2020-11-23 DIAGNOSIS — Z791 Long term (current) use of non-steroidal anti-inflammatories (NSAID): Secondary | ICD-10-CM | POA: Insufficient documentation

## 2020-11-23 DIAGNOSIS — Z95828 Presence of other vascular implants and grafts: Secondary | ICD-10-CM

## 2020-11-23 DIAGNOSIS — Z87891 Personal history of nicotine dependence: Secondary | ICD-10-CM | POA: Insufficient documentation

## 2020-11-23 DIAGNOSIS — G9389 Other specified disorders of brain: Secondary | ICD-10-CM | POA: Insufficient documentation

## 2020-11-23 DIAGNOSIS — C799 Secondary malignant neoplasm of unspecified site: Secondary | ICD-10-CM

## 2020-11-23 DIAGNOSIS — Z5112 Encounter for antineoplastic immunotherapy: Secondary | ICD-10-CM

## 2020-11-23 LAB — CMP (CANCER CENTER ONLY)
ALT: 27 U/L (ref 0–44)
AST: 22 U/L (ref 15–41)
Albumin: 3.9 g/dL (ref 3.5–5.0)
Alkaline Phosphatase: 75 U/L (ref 38–126)
Anion gap: 8 (ref 5–15)
BUN: 13 mg/dL (ref 8–23)
CO2: 26 mmol/L (ref 22–32)
Calcium: 9.3 mg/dL (ref 8.9–10.3)
Chloride: 109 mmol/L (ref 98–111)
Creatinine: 0.81 mg/dL (ref 0.61–1.24)
GFR, Estimated: >60 mL/min (ref >60)
Glucose, Bld: 102 mg/dL — ABNORMAL HIGH (ref 70–99)
Potassium: 4 mmol/L (ref 3.5–5.1)
Sodium: 143 mmol/L (ref 135–145)
Total Bilirubin: 0.6 mg/dL (ref 0.3–1.2)
Total Protein: 7.6 g/dL (ref 6.5–8.1)

## 2020-11-23 LAB — CBC WITH DIFFERENTIAL (CANCER CENTER ONLY)
Abs Immature Granulocytes: 0.03 10*3/uL (ref 0.00–0.07)
Basophils Absolute: 0 10*3/uL (ref 0.0–0.1)
Basophils Relative: 0 %
Eosinophils Absolute: 0.1 10*3/uL (ref 0.0–0.5)
Eosinophils Relative: 2 %
HCT: 34.6 % — ABNORMAL LOW (ref 39.0–52.0)
Hemoglobin: 12.3 g/dL — ABNORMAL LOW (ref 13.0–17.0)
Immature Granulocytes: 1 %
Lymphocytes Relative: 23 %
Lymphs Abs: 1.3 10*3/uL (ref 0.7–4.0)
MCH: 31.5 pg (ref 26.0–34.0)
MCHC: 35.5 g/dL (ref 30.0–36.0)
MCV: 88.5 fL (ref 80.0–100.0)
Monocytes Absolute: 0.5 10*3/uL (ref 0.1–1.0)
Monocytes Relative: 8 %
Neutro Abs: 3.7 10*3/uL (ref 1.7–7.7)
Neutrophils Relative %: 66 %
Platelet Count: 275 10*3/uL (ref 150–400)
RBC: 3.91 MIL/uL — ABNORMAL LOW (ref 4.22–5.81)
RDW: 13.7 % (ref 11.5–15.5)
WBC Count: 5.6 10*3/uL (ref 4.0–10.5)
nRBC: 0 % (ref 0.0–0.2)

## 2020-11-23 LAB — TSH: TSH: 0.948 u[IU]/mL (ref 0.320–4.118)

## 2020-11-23 MED ORDER — HEPARIN SOD (PORK) LOCK FLUSH 100 UNIT/ML IV SOLN
500.0000 [IU] | Freq: Once | INTRAVENOUS | Status: AC | PRN
  Administered 2020-11-23: 500 [IU]
  Filled 2020-11-23: qty 5

## 2020-11-23 MED ORDER — SODIUM CHLORIDE 0.9 % IV SOLN
Freq: Once | INTRAVENOUS | Status: AC
  Filled 2020-11-23: qty 250

## 2020-11-23 MED ORDER — SODIUM CHLORIDE 0.9 % IV SOLN
350.0000 mg | Freq: Once | INTRAVENOUS | Status: AC
  Administered 2020-11-23: 350 mg via INTRAVENOUS
  Filled 2020-11-23: qty 7

## 2020-11-23 MED ORDER — SODIUM CHLORIDE 0.9% FLUSH
10.0000 mL | INTRAVENOUS | Status: DC | PRN
  Administered 2020-11-23: 10 mL
  Filled 2020-11-23: qty 10

## 2020-11-23 MED ORDER — SODIUM CHLORIDE 0.9% FLUSH
10.0000 mL | Freq: Once | INTRAVENOUS | Status: AC
  Administered 2020-11-23: 10 mL
  Filled 2020-11-23: qty 10

## 2020-11-23 NOTE — Progress Notes (Signed)
[place] Telephone:([phone]) [phone]   Fax:(336) [phone]  OFFICE PROGRESS NOTE  [name], MD [place] [address]  DIAGNOSIS: stage IV (TX, N2, M1 C) non-small cell lung cancer, adenocarcinoma diagnosed in August 2021 and presented with solitary brain metastasis in addition to mediastinal lymphadenopathy.  PDL1 Expression 70%   Molecular Biomarkers:  Tumor Mutational Burden - 52 Muts/Mb Microsatellite status - MS-Stable Genomic Findings For a complete list of the genes assayed, please refer to the Appendix. NF1 E1694* MTAP loss exons 2-8 RICTOR amplification ATRX A419V BRAF K483E CDKN2A/B CDKN2A loss, CDKN2B loss DNMT3A E205* FGF10 amplification NTRK1 amplification - equivocal? 7 Disease relevant genes with no reportable alterations: ALK, EGFR, ERBB2, KRAS, MET, RET, ROS1    PRIOR THERAPY:  1) Status post right craniotomy with tumor resection followed by [facility] to solitary brain metastasis under the care of Dr. Lisbeth Renshaw and Dr. Sherral Hammers. 2) Concurrent chemoradiation with weekly carboplatin for AUC of 2 and paclitaxel 45 mg/M2.  First dose 02/01/2020. Status post 5 cycles.  Last dose was given February 29, 2020.   CURRENT THERAPY:  First-line treatment with immunotherapy with Libtayo (Cempilimab) 350 mg IV every 3 weeks.  First dose April 25, 2020.  Status post 9 cycles.  INTERVAL HISTORY: Jared Shea. 65 y.o. male returns to the clinic today for follow-up visit.  The patient continues to tolerate his treatment fairly well with no concerning adverse except for the generalized joint pains mainly in the knees and he takes Advil 600 mg 3 times a day as needed.  He also has mild numbness in the left hand.  He denied having any current chest pain but has mild shortness of breath with exertion with no cough or hemoptysis.  He denied having any fever or chills.  He has no nausea, vomiting, diarrhea or constipation.  He has no headache or visual  changes.  He had repeat CT scan of the chest, abdomen pelvis performed yesterday and he is here for evaluation and discussion of his scan results and treatment options.  MEDICAL HISTORY: Past Medical History:  Diagnosis Date   Brain mass    Bursitis of right hip    Chest pain 08/12/2018   Chronic cough    Dependence on nicotine from cigarettes    Diabetes mellitus without complication ([place])    Essential hypertension 07/28/2018   nscl ca dx'd 12/2019   Seizures ([place])    Viral illness 03/23/2019    ALLERGIES:  is allergic to penicillins.  MEDICATIONS:  Current Outpatient Medications  Medication Sig Dispense Refill   diclofenac Sodium (VOLTAREN) 1 % GEL Apply 2 g topically 4 (four) times daily. 350 g 3   ibuprofen (ADVIL) 200 MG tablet Take 400 mg by mouth every 6 (six) hours as needed for mild pain.     levETIRAcetam (KEPPRA) 750 MG tablet Take 1 tablet (750 mg total) by mouth 2 (two) times daily. 180 tablet 4   losartan (COZAAR) 25 MG tablet Take 1 tablet (25 mg total) by mouth daily. 30 tablet 11   magnesium citrate SOLN Take 1 Bottle by mouth once.     Melatonin 10 MG TABS Take 10 mg by mouth at bedtime as needed (sleep).     nortriptyline (PAMELOR) 25 MG capsule Take 2 capsules (50 mg total) by mouth at bedtime. 60 capsule 11   pseudoephedrine-acetaminophen (TYLENOL SINUS) 30-500 MG TABS tablet Take 1 tablet by mouth every 4 (four) hours as needed (  sinus pressure).     SUMAtriptan (IMITREX) 25 MG tablet Take 1 tablet (25 mg total) by mouth every 2 (two) hours as needed for migraine. May repeat in 2 hours if headache persists or recurs. 10 tablet 6   No current facility-administered medications for this visit.    SURGICAL HISTORY:  Past Surgical History:  Procedure Laterality Date   APPLICATION OF CRANIAL NAVIGATION N/A 01/07/2020   Procedure: APPLICATION OF CRANIAL NAVIGATION;  Surgeon: Ashok Pall, MD;  Location: [place] [facility];  Service: Neurosurgery;  Laterality: N/A;   CRANIOTOMY  Right 01/07/2020   Procedure: RIGHT CRANIOTOMY FOR TUMOR RESECTION;  Surgeon: Ashok Pall, MD;  Location: [facility];  Service: Neurosurgery;  Laterality: Right;  rm 21   ENDOBRONCHIAL ULTRASOUND N/A 12/21/2019   Procedure: ENDOBRONCHIAL ULTRASOUND;  Surgeon: Laurin Coder, MD;  Location: [facility];  Service: Pulmonary;  Laterality: N/A;   FINE NEEDLE ASPIRATION  12/21/2019   Procedure: FINE NEEDLE ASPIRATION (FNA) LINEAR;  Surgeon: Laurin Coder, MD;  Location: [facility];  Service: Pulmonary;;   IR IMAGING GUIDED PORT INSERTION  03/03/2020   NO PAST SURGERIES     VIDEO BRONCHOSCOPY N/A 12/21/2019   Procedure: VIDEO BRONCHOSCOPY WITHOUT FLUORO;  Surgeon: Laurin Coder, MD;  Location: [facility];  Service: Pulmonary;  Laterality: N/A;    REVIEW OF SYSTEMS:  Constitutional: positive for fatigue Eyes: negative Ears, nose, mouth, throat, and face: negative Respiratory: positive for dyspnea on exertion Cardiovascular: negative Gastrointestinal: negative Genitourinary:negative Integument/breast: negative Hematologic/lymphatic: negative Musculoskeletal:positive for arthralgias Neurological: negative Behavioral/Psych: negative Endocrine: negative Allergic/Immunologic: negative   PHYSICAL EXAMINATION: General appearance: alert, cooperative, and no distress Head: Normocephalic, without obvious abnormality, atraumatic Neck: no adenopathy, no JVD, supple, symmetrical, trachea midline, and thyroid not enlarged, symmetric, no tenderness/mass/nodules Lymph nodes: Cervical, supraclavicular, and axillary nodes normal. Resp: clear to auscultation bilaterally Back: symmetric, no curvature. ROM normal. No CVA tenderness. Cardio: regular rate and rhythm, S1, S2 normal, no murmur, click, rub or gallop GI: soft, non-tender; bowel sounds normal; no masses,  no organomegaly Extremities: extremities normal, atraumatic, no cyanosis or edema Neurologic: Alert and oriented X 3, normal strength  and tone. Normal symmetric reflexes. Normal coordination and gait  ECOG PERFORMANCE STATUS: 1 - Symptomatic but completely ambulatory  Blood pressure (!) 147/79, pulse 83, temperature (!) 96.9 F (36.1 C), temperature source Tympanic, resp. rate 20, height '6\' 1"'  (1.854 m), weight 201 lb 11.2 oz (91.5 kg), SpO2 100 %.  LABORATORY DATA: Lab Results  Component Value Date   WBC 5.6 11/23/2020   HGB 12.3 (L) 11/23/2020   HCT 34.6 (L) 11/23/2020   MCV 88.5 11/23/2020   PLT 275 11/23/2020      Chemistry      Component Value Date/Time   NA 143 11/23/2020 1100   NA 140 11/23/2019 1516   K 4.0 11/23/2020 1100   CL 109 11/23/2020 1100   CO2 26 11/23/2020 1100   BUN 13 11/23/2020 1100   BUN 14 11/23/2019 1516   CREATININE 0.81 11/23/2020 1100      Component Value Date/Time   CALCIUM 9.3 11/23/2020 1100   ALKPHOS 75 11/23/2020 1100   AST 22 11/23/2020 1100   ALT 27 11/23/2020 1100   BILITOT 0.6 11/23/2020 1100       RADIOGRAPHIC STUDIES: CT Chest W Contrast  Result Date: 11/23/2020 CLINICAL DATA:  Primary Cancer Type: Lung Imaging Indication: Assess response to therapy Interval therapy since last imaging? Yes Initial Cancer Diagnosis Date: 12/21/2019; Established by: Biopsy-proven Detailed  Pathology: Stage IV non-small cell lung cancer, adenocarcinoma. Primary Tumor location: Mediastinal and left hilar adenopathy. Brain metastasis. Surgeries: Right craniotomy for tumor resection. Chemotherapy: Yes; Ongoing? No; Most recent administration: 02/29/2020 Immunotherapy?  Yes; Type: Cempilimab; Ongoing? Yes Radiation therapy? Yes Date Range: 01/25/2020 - 03/14/2020; Target: Left lung Date Range: 04/22/2020; Target: Brain EXAM: CT CHEST, ABDOMEN, AND PELVIS WITH CONTRAST TECHNIQUE: Multidetector CT imaging of the chest, abdomen and pelvis was performed following the standard protocol during bolus administration of intravenous contrast. CONTRAST:  119m OMNIPAQUE IOHEXOL 350 MG/ML SOLN  COMPARISON:  Most recent CT chest, abdomen and pelvis 09/16/2020. 01/01/2020 PET-CT. FINDINGS: CT CHEST FINDINGS Cardiovascular: Heart size appears within normal limits. No pericardial effusion identified. Coronary artery calcifications noted. Mediastinum/Nodes: Normal appearance of the thyroid gland. The trachea appears patent and is midline. Normal appearance of the esophagus. Index low right paratracheal lymph node is stable measuring 7 mm, image 23/2. Index subcarinal node measures 7 mm, image 30/2.  Also stable. Increased soft tissue in the left infrahilar region is unchanged measuring 9 mm, image 33/2. No enlarged lymph nodes by CT criteria identified at this time. Lungs/Pleura: Trace pleural fluid is again noted overlying the posteromedial left lower lobe, image 33/2. Bilateral paramediastinal radiation change is again noted. Scarring is again seen within the lingula and right middle lobe. No suspicious nodules identified. Musculoskeletal: No chest wall mass or suspicious bone lesions identified. CT ABDOMEN PELVIS FINDINGS Hepatobiliary: No focal liver abnormality is seen. No gallstones, gallbladder wall thickening, or biliary dilatation. Pancreas: Unremarkable. No pancreatic ductal dilatation or surrounding inflammatory changes. Spleen: Normal in size without focal abnormality. Adrenals/Urinary Tract: Normal adrenal glands. No kidney mass or hydronephrosis identified. Urinary bladder is unremarkable. Stomach/Bowel: Stomach is normal. No bowel wall thickening, inflammation, or distension. Vascular/Lymphatic: Aortic atherosclerosis. No aneurysm. Upper abdominal adenopathy is again noted: Index portacaval lymph node measures 1.7 cm, image 64/2.  Unchanged. Porta hepatic lymph node measures 1.3 cm, image 61/2. Stable. Gastrohepatic ligament node measures 1.2 cm, image 58/2. Unchanged. No pelvic or inguinal adenopathy. Reproductive: Prostate is unremarkable. Other: Small fat containing inguinal hernias. No free  fluid or fluid collections identified. Musculoskeletal: No acute or significant osseous findings. IMPRESSION: 1. Stable exam. No signs of recurrent tumor or metastatic disease within the chest. 2. Enlarged upper abdominal lymph nodes are unchanged when compared with the previous exam as detailed above. 3. Aortic Atherosclerosis (ICD10-I70.0). Coronary artery calcifications. Electronically Signed   By: TKerby MoorsM.D.   On: 11/23/2020 10:33   MR BRAIN W WO CONTRAST  Result Date: 10/30/2020 CLINICAL DATA:  Brain/CNS neoplasm, assess treatment response. Metastatic small cell lung cancer. EXAM: MRI HEAD WITHOUT AND WITH CONTRAST TECHNIQUE: Multiplanar, multiecho pulse sequences of the brain and surrounding structures were obtained without and with intravenous contrast. CONTRAST:  9103mGADAVIST GADOBUTROL 1 MMOL/ML IV SOLN COMPARISON:  07/23/2020. FINDINGS: BRAIN New Lesions: None. Larger lesions: None. Stable or Smaller lesions: None. Previously seen punctate focus of enhancement in the occipital horn of the right lateral ventricle is no longer present. Other Brain findings: Focal encephalomalacia in the posterior right frontal lobe subjacent to the craniotomy site is stable. Periventricular T2 signal changes are mildly advanced for age. No acute infarct or hemorrhage is present. The internal auditory canals are within normal limits. The brainstem and cerebellum are within normal limits. Vascular: Flow is present in the major intracranial arteries. Skull and upper cervical spine: The craniocervical junction is normal. Upper cervical spine is within normal limits. Marrow signal is unremarkable.  Sinuses/Orbits: The paranasal sinuses and mastoid air cells are clear. The globes and orbits are within normal limits. IMPRESSION: 1. Stable postoperative changes of the posterior right frontal lobe. 2. No evidence for residual or recurrent tumor. Previously seen punctate area of enhancement is no longer present. 3. Stable  atrophy and white matter disease. This likely reflects the sequela of chronic microvascular ischemia. Some changes may be secondary to prior radiation. Electronically Signed   By: San Morelle M.D.   On: 10/30/2020 20:28   CT Abdomen Pelvis W Contrast  Result Date: 11/23/2020 CLINICAL DATA:  Primary Cancer Type: Lung Imaging Indication: Assess response to therapy Interval therapy since last imaging? Yes Initial Cancer Diagnosis Date: 12/21/2019; Established by: Biopsy-proven Detailed Pathology: Stage IV non-small cell lung cancer, adenocarcinoma. Primary Tumor location: Mediastinal and left hilar adenopathy. Brain metastasis. Surgeries: Right craniotomy for tumor resection. Chemotherapy: Yes; Ongoing? No; Most recent administration: 02/29/2020 Immunotherapy?  Yes; Type: Cempilimab; Ongoing? Yes Radiation therapy? Yes Date Range: 01/25/2020 - 03/14/2020; Target: Left lung Date Range: 04/22/2020; Target: Brain EXAM: CT CHEST, ABDOMEN, AND PELVIS WITH CONTRAST TECHNIQUE: Multidetector CT imaging of the chest, abdomen and pelvis was performed following the standard protocol during bolus administration of intravenous contrast. CONTRAST:  136m OMNIPAQUE IOHEXOL 350 MG/ML SOLN COMPARISON:  Most recent CT chest, abdomen and pelvis 09/16/2020. 01/01/2020 PET-CT. FINDINGS: CT CHEST FINDINGS Cardiovascular: Heart size appears within normal limits. No pericardial effusion identified. Coronary artery calcifications noted. Mediastinum/Nodes: Normal appearance of the thyroid gland. The trachea appears patent and is midline. Normal appearance of the esophagus. Index low right paratracheal lymph node is stable measuring 7 mm, image 23/2. Index subcarinal node measures 7 mm, image 30/2.  Also stable. Increased soft tissue in the left infrahilar region is unchanged measuring 9 mm, image 33/2. No enlarged lymph nodes by CT criteria identified at this time. Lungs/Pleura: Trace pleural fluid is again noted overlying the  posteromedial left lower lobe, image 33/2. Bilateral paramediastinal radiation change is again noted. Scarring is again seen within the lingula and right middle lobe. No suspicious nodules identified. Musculoskeletal: No chest wall mass or suspicious bone lesions identified. CT ABDOMEN PELVIS FINDINGS Hepatobiliary: No focal liver abnormality is seen. No gallstones, gallbladder wall thickening, or biliary dilatation. Pancreas: Unremarkable. No pancreatic ductal dilatation or surrounding inflammatory changes. Spleen: Normal in size without focal abnormality. Adrenals/Urinary Tract: Normal adrenal glands. No kidney mass or hydronephrosis identified. Urinary bladder is unremarkable. Stomach/Bowel: Stomach is normal. No bowel wall thickening, inflammation, or distension. Vascular/Lymphatic: Aortic atherosclerosis. No aneurysm. Upper abdominal adenopathy is again noted: Index portacaval lymph node measures 1.7 cm, image 64/2.  Unchanged. Porta hepatic lymph node measures 1.3 cm, image 61/2. Stable. Gastrohepatic ligament node measures 1.2 cm, image 58/2. Unchanged. No pelvic or inguinal adenopathy. Reproductive: Prostate is unremarkable. Other: Small fat containing inguinal hernias. No free fluid or fluid collections identified. Musculoskeletal: No acute or significant osseous findings. IMPRESSION: 1. Stable exam. No signs of recurrent tumor or metastatic disease within the chest. 2. Enlarged upper abdominal lymph nodes are unchanged when compared with the previous exam as detailed above. 3. Aortic Atherosclerosis (ICD10-I70.0). Coronary artery calcifications. Electronically Signed   By: TKerby MoorsM.D.   On: 11/23/2020 10:33     ASSESSMENT AND PLAN: This is a very pleasant 65years old African-American male recently diagnosed with a stage IV (TX, N2, M1c) non-small cell lung cancer, adenocarcinoma presented with solitary brain metastasis in addition to right hilar and mediastinal lymphadenopathy diagnosed in  August 2021.  The patient is status post right craniotomy with resection of the solitary brain metastasis with SRS. His PET scan showed no evidence of metastatic disease outside the chest. The patient was treated with a course of concurrent chemoradiation with weekly carboplatin and paclitaxel status post 5 cycles.  He tolerated his treatment well except for dysphagia and odynophagia. His PD-L1 expression is 70%. The patient started treatment with immunotherapy with Cemiplimab 350 mg IV every 3 weeks status post 9 cycles.   The patient continues to tolerate this treatment fairly well except for arthralgia. He had repeat CT scan of the chest, abdomen pelvis performed recently.  I personally and independently reviewed the scans and discussed the results with the patient today. Has a scan showed no concerning findings for disease progression and in general he has a stable disease. I recommended for the patient to continue his current treatment with Libtayo (Cempilimab) and he will proceed with cycle #10 today as planned. For the arthralgia, he will continue his current treatment with Advil as needed. The patient was advised to call immediately if he has any other concerning symptoms in the interval. The patient voices understanding of current disease status and treatment options and is in agreement with the current care plan.  All questions were answered. The patient knows to call the clinic with any problems, questions or concerns. We can certainly see the patient much sooner if necessary.   Disclaimer: This note was dictated with voice recognition software. Similar sounding words can inadvertently be transcribed and may not be corrected upon review.

## 2020-11-23 NOTE — Patient Instructions (Signed)
[facility] ONCOL[facility]  Discharge Instructions: Thank you for choosing [facility] to provide your oncology and hematology care.   If you have a lab appointment with the [facility], please go directly to the [facility] and check in at the registration area.   Wear comfortable clothing and clothing appropriate for easy access to any Portacath or PICC line.   We strive to give you quality time with your provider. You may need to reschedule your appointment if you arrive late (15 or more minutes).  Arriving late affects you and other patients whose appointments are after yours.  Also, if you miss three or more appointments without notifying the office, you may be dismissed from the clinic at the provider's discretion.      For prescription refill requests, have your pharmacy contact our office and allow 72 hours for refills to be completed.    Today you received the following chemotherapy and/or immunotherapy agents: Libtayo.      To help prevent nausea and vomiting after your treatment, we encourage you to take your nausea medication as directed.  BELOW ARE SYMPTOMS THAT SHOULD BE REPORTED IMMEDIATELY: *FEVER GREATER THAN 100.4 F (38 C) OR HIGHER *CHILLS OR SWEATING *NAUSEA AND VOMITING THAT IS NOT CONTROLLED WITH YOUR NAUSEA MEDICATION *UNUSUAL SHORTNESS OF BREATH *UNUSUAL BRUISING OR BLEEDING *URINARY PROBLEMS (pain or burning when urinating, or frequent urination) *BOWEL PROBLEMS (unusual diarrhea, constipation, pain near the anus) TENDERNESS IN MOUTH AND THROAT WITH OR WITHOUT PRESENCE OF ULCERS (sore throat, sores in mouth, or a toothache) UNUSUAL RASH, SWELLING OR PAIN  UNUSUAL VAGINAL DISCHARGE OR ITCHING   Items with * indicate a potential emergency and should be followed up as soon as possible or go to the Emergency Department if any problems should occur.  Please show the CHEMOTHERAPY ALERT CARD or IMMUNOTHERAPY ALERT CARD at check-in to  the Emergency Department and triage nurse.  Should you have questions after your visit or need to cancel or reschedule your appointment, please contact [facility]  Dept: [phone]  and follow the prompts.  Office hours are 8:00 a.m. to 4:30 p.m. Monday - Friday. Please note that voicemails left after 4:00 p.m. may not be returned until the following business day.  We are closed weekends and major holidays. You have access to a nurse at all times for urgent questions. Please call the main number to the clinic Dept: [phone] and follow the prompts.   For any non-urgent questions, you may also contact your provider using MyChart. We now offer e-Visits for anyone 5 and older to request care online for non-urgent symptoms. For details visit [email or web address].   Also download the MyChart app! Go to the app store, search "MyChart", open the app, select [facility], and log in with your MyChart username and password.  Due to Covid, a mask is required upon entering the hospital/clinic. If you do not have a mask, one will be given to you upon arrival. For doctor visits, patients may have 1 support person aged 35 or older with them. For treatment visits, patients cannot have anyone with them due to current Covid guidelines and our immunocompromised population.

## 2020-11-28 ENCOUNTER — Telehealth: Payer: Self-pay | Admitting: Internal Medicine

## 2020-11-28 NOTE — Telephone Encounter (Signed)
Scheduled per los. Called and left msg. Mailed printout  °

## 2020-12-09 NOTE — Progress Notes (Signed)
[facility] OFFICE PROGRESS NOTE  Jared Presto, MD [place] [address]  DIAGNOSIS: Stage IV (TX, N2, M1 C) non-small cell lung cancer, adenocarcinoma diagnosed in August 2021 and presented with solitary brain metastasis in addition to mediastinal lymphadenopathy.   PDL1 Expression 70%   Molecular Biomarkers: No actionable mutations  PRIOR THERAPY: 1) Status post right craniotomy with tumor resection followed by SRS to solitary brain metastasis under the care of Dr. Lisbeth Renshaw and Dr. Sherral Hammers. 2) Concurrent chemoradiation with weekly carboplatin for AUC of 2 and paclitaxel 45 mg/M2.  First dose 02/01/2020. Status post 5 cycles.  Last dose was given February 29, 2020. 3) SRS to the new metastatic brain lesions under the care of Dr. Lisbeth Renshaw. Completed on 04/22/20.  CURRENT THERAPY: First-line treatment with immunotherapy with Libtayo (Cempilimab) 350 mg IV every 3 weeks.  First dose April 29, 2020. Status post 11 cycles.   INTERVAL HISTORY: Jared Shea. 65 y.o. male returns to the clinic today for a follow up visit . The patient is feeling fair today without any concerning complaints except for generalized poly arthralgias. He only takes tylenol once a day or so. We previously recommended alternating tylenol and ibuprofen if needed but he is reluctant to do so. Otherwise, besides the joint pain, he is feeling well. The patient denies recent fevers, chills, or weight loss. He reports he has intermittent night sweats. He states he "breaths good I say". He had some blood in the sputum a few weeks ago but none recently. Denies chest pain. He had a restaging CT scan of the chest which was reviewed at his last appointment which looked stable. He denies any nausea, vomiting, diarrhea, or constipation. He denies any rashes or skin changes. He denies any headaches since being placed on medications for his headaches. He follows closely with neuro-oncology for his history of  metastatic disease to the brain. He is here today for evaluation before starting cycle #12.   MEDICAL HISTORY: Past Medical History:  Diagnosis Date   Brain mass    Bursitis of right hip    Chest pain 08/12/2018   Chronic cough    Dependence on nicotine from cigarettes    Diabetes mellitus without complication ([facility])    Essential hypertension 07/28/2018   nscl ca dx'd 12/2019   Seizures ([facility])    Viral illness 03/23/2019    ALLERGIES:  is allergic to penicillins.  MEDICATIONS:  Current Outpatient Medications  Medication Sig Dispense Refill   ibuprofen (ADVIL) 200 MG tablet Take 400 mg by mouth every 6 (six) hours as needed for mild pain.     levETIRAcetam (KEPPRA) 750 MG tablet Take 1 tablet (750 mg total) by mouth 2 (two) times daily. 180 tablet 4   losartan (COZAAR) 25 MG tablet Take 1 tablet (25 mg total) by mouth daily. 30 tablet 11   Melatonin 10 MG TABS Take 10 mg by mouth at bedtime as needed (sleep).     nortriptyline (PAMELOR) 25 MG capsule Take 2 capsules (50 mg total) by mouth at bedtime. 60 capsule 11   pseudoephedrine-acetaminophen (TYLENOL SINUS) 30-500 MG TABS tablet Take 1 tablet by mouth every 4 (four) hours as needed (sinus pressure).     SUMAtriptan (IMITREX) 25 MG tablet Take 1 tablet (25 mg total) by mouth every 2 (two) hours as needed for migraine. May repeat in 2 hours if headache persists or recurs. (Patient not taking: No sig reported) 10 tablet 6   No current  facility-administered medications for this visit.    SURGICAL HISTORY:  Past Surgical History:  Procedure Laterality Date   APPLICATION OF CRANIAL NAVIGATION N/A 01/07/2020   Procedure: APPLICATION OF CRANIAL NAVIGATION;  Surgeon: Ashok Pall, MD;  Location: [facility];  Service: Neurosurgery;  Laterality: N/A;   CRANIOTOMY Right 01/07/2020   Procedure: RIGHT CRANIOTOMY FOR TUMOR RESECTION;  Surgeon: Ashok Pall, MD;  Location: [facility];  Service: Neurosurgery;  Laterality: Right;  rm 21   ENDOBRONCHIAL  ULTRASOUND N/A 12/21/2019   Procedure: ENDOBRONCHIAL ULTRASOUND;  Surgeon: Laurin Coder, MD;  Location: [facility];  Service: Pulmonary;  Laterality: N/A;   FINE NEEDLE ASPIRATION  12/21/2019   Procedure: FINE NEEDLE ASPIRATION (FNA) LINEAR;  Surgeon: Laurin Coder, MD;  Location: [facility];  Service: Pulmonary;;   IR IMAGING GUIDED PORT INSERTION  03/03/2020   NO PAST SURGERIES     VIDEO BRONCHOSCOPY N/A 12/21/2019   Procedure: VIDEO BRONCHOSCOPY WITHOUT FLUORO;  Surgeon: Laurin Coder, MD;  Location: [facility];  Service: Pulmonary;  Laterality: N/A;    REVIEW OF SYSTEMS:   Review of Systems  Constitutional: Positive for fatigue. Negative for appetite change, chills, fever and unexpected weight change.  HENT:   Negative for mouth sores, nosebleeds, sore throat and trouble swallowing.   Eyes: Negative for eye problems and icterus.  Respiratory: Negative for cough, hemoptysis, shortness of breath and wheezing.   Cardiovascular: Negative for chest pain and leg swelling.  Gastrointestinal: Negative for abdominal pain, constipation, diarrhea, nausea and vomiting.  Genitourinary: Negative for bladder incontinence, difficulty urinating, dysuria, frequency and hematuria.   Musculoskeletal: Positive for polyarthralgia. Negative for back pain, gait problem, neck pain and neck stiffness.  Skin: Negative for itching and rash.  Neurological: Negative for dizziness, extremity weakness, gait problem, headaches, light-headedness and seizures.  Hematological: Negative for adenopathy. Does not bruise/bleed easily.  Psychiatric/Behavioral: Negative for confusion, depression and sleep disturbance. The patient is not nervous/anxious.     PHYSICAL EXAMINATION:  Blood pressure (!) 155/91, pulse 90, temperature (!) 96.4 F (35.8 C), temperature source Tympanic, resp. rate 19, height 6' 1" (1.854 m), weight 202 lb 11.2 oz (91.9 kg), SpO2 100 %.  ECOG PERFORMANCE STATUS: 1  Physical Exam   Constitutional: Oriented to person, place, and time and well-developed, well-nourished, and in no distress.  HENT:  Head: Normocephalic and atraumatic.  Mouth/Throat: Oropharynx is clear and moist. No oropharyngeal exudate.  Eyes: Conjunctivae are normal. Right eye exhibits no discharge. Left eye exhibits no discharge. No scleral icterus.  Neck: Normal range of motion. Neck supple.  Cardiovascular: Normal rate, regular rhythm, normal heart sounds and intact distal pulses.   Pulmonary/Chest: Effort normal and breath sounds normal. No respiratory distress. No wheezes. No rales.  Abdominal: Soft. Bowel sounds are normal. Exhibits no distension and no mass. There is no tenderness.  Musculoskeletal: Normal range of motion. Exhibits no edema.  Lymphadenopathy:    No cervical adenopathy.  Neurological: Alert and oriented to person, place, and time. Exhibits normal muscle tone. Gait normal. Coordination normal.  Skin: Skin is warm and dry. No rash noted. Not diaphoretic. No erythema. No pallor.  Psychiatric: Mood, memory and judgment normal.  Vitals reviewed.  LABORATORY DATA: Lab Results  Component Value Date   WBC 5.3 12/13/2020   HGB 11.4 (L) 12/13/2020   HCT 31.8 (L) 12/13/2020   MCV 87.6 12/13/2020   PLT 257 12/13/2020      Chemistry      Component Value Date/Time   NA  143 12/13/2020 0837   NA 140 11/23/2019 1516   K 3.3 (L) 12/13/2020 0837   CL 110 12/13/2020 0837   CO2 24 12/13/2020 0837   BUN 13 12/13/2020 0837   BUN 14 11/23/2019 1516   CREATININE 0.90 12/13/2020 0837      Component Value Date/Time   CALCIUM 9.0 12/13/2020 0837   ALKPHOS 73 12/13/2020 0837   AST 21 12/13/2020 0837   ALT 25 12/13/2020 0837   BILITOT 0.7 12/13/2020 0837       RADIOGRAPHIC STUDIES:  CT Chest W Contrast  Result Date: 11/23/2020 CLINICAL DATA:  Primary Cancer Type: Lung Imaging Indication: Assess response to therapy Interval therapy since last imaging? Yes Initial Cancer Diagnosis  Date: 12/21/2019; Established by: Biopsy-proven Detailed Pathology: Stage IV non-small cell lung cancer, adenocarcinoma. Primary Tumor location: Mediastinal and left hilar adenopathy. Brain metastasis. Surgeries: Right craniotomy for tumor resection. Chemotherapy: Yes; Ongoing? No; Most recent administration: 02/29/2020 Immunotherapy?  Yes; Type: Cempilimab; Ongoing? Yes Radiation therapy? Yes Date Range: 01/25/2020 - 03/14/2020; Target: Left lung Date Range: 04/22/2020; Target: Brain EXAM: CT CHEST, ABDOMEN, AND PELVIS WITH CONTRAST TECHNIQUE: Multidetector CT imaging of the chest, abdomen and pelvis was performed following the standard protocol during bolus administration of intravenous contrast. CONTRAST:  127m OMNIPAQUE IOHEXOL 350 MG/ML SOLN COMPARISON:  Most recent CT chest, abdomen and pelvis 09/16/2020. 01/01/2020 PET-CT. FINDINGS: CT CHEST FINDINGS Cardiovascular: Heart size appears within normal limits. No pericardial effusion identified. Coronary artery calcifications noted. Mediastinum/Nodes: Normal appearance of the thyroid gland. The trachea appears patent and is midline. Normal appearance of the esophagus. Index low right paratracheal lymph node is stable measuring 7 mm, image 23/2. Index subcarinal node measures 7 mm, image 30/2.  Also stable. Increased soft tissue in the left infrahilar region is unchanged measuring 9 mm, image 33/2. No enlarged lymph nodes by CT criteria identified at this time. Lungs/Pleura: Trace pleural fluid is again noted overlying the posteromedial left lower lobe, image 33/2. Bilateral paramediastinal radiation change is again noted. Scarring is again seen within the lingula and right middle lobe. No suspicious nodules identified. Musculoskeletal: No chest wall mass or suspicious bone lesions identified. CT ABDOMEN PELVIS FINDINGS Hepatobiliary: No focal liver abnormality is seen. No gallstones, gallbladder wall thickening, or biliary dilatation. Pancreas: Unremarkable. No  pancreatic ductal dilatation or surrounding inflammatory changes. Spleen: Normal in size without focal abnormality. Adrenals/Urinary Tract: Normal adrenal glands. No kidney mass or hydronephrosis identified. Urinary bladder is unremarkable. Stomach/Bowel: Stomach is normal. No bowel wall thickening, inflammation, or distension. Vascular/Lymphatic: Aortic atherosclerosis. No aneurysm. Upper abdominal adenopathy is again noted: Index portacaval lymph node measures 1.7 cm, image 64/2.  Unchanged. Porta hepatic lymph node measures 1.3 cm, image 61/2. Stable. Gastrohepatic ligament node measures 1.2 cm, image 58/2. Unchanged. No pelvic or inguinal adenopathy. Reproductive: Prostate is unremarkable. Other: Small fat containing inguinal hernias. No free fluid or fluid collections identified. Musculoskeletal: No acute or significant osseous findings. IMPRESSION: 1. Stable exam. No signs of recurrent tumor or metastatic disease within the chest. 2. Enlarged upper abdominal lymph nodes are unchanged when compared with the previous exam as detailed above. 3. Aortic Atherosclerosis (ICD10-I70.0). Coronary artery calcifications. Electronically Signed   By: TKerby MoorsM.D.   On: 11/23/2020 10:33   CT Abdomen Pelvis W Contrast  Result Date: 11/23/2020 CLINICAL DATA:  Primary Cancer Type: Lung Imaging Indication: Assess response to therapy Interval therapy since last imaging? Yes Initial Cancer Diagnosis Date: 12/21/2019; Established by: Biopsy-proven Detailed Pathology: Stage IV non-small cell  lung cancer, adenocarcinoma. Primary Tumor location: Mediastinal and left hilar adenopathy. Brain metastasis. Surgeries: Right craniotomy for tumor resection. Chemotherapy: Yes; Ongoing? No; Most recent administration: 02/29/2020 Immunotherapy?  Yes; Type: Cempilimab; Ongoing? Yes Radiation therapy? Yes Date Range: 01/25/2020 - 03/14/2020; Target: Left lung Date Range: 04/22/2020; Target: Brain EXAM: CT CHEST, ABDOMEN, AND PELVIS  WITH CONTRAST TECHNIQUE: Multidetector CT imaging of the chest, abdomen and pelvis was performed following the standard protocol during bolus administration of intravenous contrast. CONTRAST:  162m OMNIPAQUE IOHEXOL 350 MG/ML SOLN COMPARISON:  Most recent CT chest, abdomen and pelvis 09/16/2020. 01/01/2020 PET-CT. FINDINGS: CT CHEST FINDINGS Cardiovascular: Heart size appears within normal limits. No pericardial effusion identified. Coronary artery calcifications noted. Mediastinum/Nodes: Normal appearance of the thyroid gland. The trachea appears patent and is midline. Normal appearance of the esophagus. Index low right paratracheal lymph node is stable measuring 7 mm, image 23/2. Index subcarinal node measures 7 mm, image 30/2.  Also stable. Increased soft tissue in the left infrahilar region is unchanged measuring 9 mm, image 33/2. No enlarged lymph nodes by CT criteria identified at this time. Lungs/Pleura: Trace pleural fluid is again noted overlying the posteromedial left lower lobe, image 33/2. Bilateral paramediastinal radiation change is again noted. Scarring is again seen within the lingula and right middle lobe. No suspicious nodules identified. Musculoskeletal: No chest wall mass or suspicious bone lesions identified. CT ABDOMEN PELVIS FINDINGS Hepatobiliary: No focal liver abnormality is seen. No gallstones, gallbladder wall thickening, or biliary dilatation. Pancreas: Unremarkable. No pancreatic ductal dilatation or surrounding inflammatory changes. Spleen: Normal in size without focal abnormality. Adrenals/Urinary Tract: Normal adrenal glands. No kidney mass or hydronephrosis identified. Urinary bladder is unremarkable. Stomach/Bowel: Stomach is normal. No bowel wall thickening, inflammation, or distension. Vascular/Lymphatic: Aortic atherosclerosis. No aneurysm. Upper abdominal adenopathy is again noted: Index portacaval lymph node measures 1.7 cm, image 64/2.  Unchanged. Porta hepatic lymph node  measures 1.3 cm, image 61/2. Stable. Gastrohepatic ligament node measures 1.2 cm, image 58/2. Unchanged. No pelvic or inguinal adenopathy. Reproductive: Prostate is unremarkable. Other: Small fat containing inguinal hernias. No free fluid or fluid collections identified. Musculoskeletal: No acute or significant osseous findings. IMPRESSION: 1. Stable exam. No signs of recurrent tumor or metastatic disease within the chest. 2. Enlarged upper abdominal lymph nodes are unchanged when compared with the previous exam as detailed above. 3. Aortic Atherosclerosis (ICD10-I70.0). Coronary artery calcifications. Electronically Signed   By: TKerby MoorsM.D.   On: 11/23/2020 10:33     ASSESSMENT/PLAN:  This is a very pleasant 65year old African-American male diagnosed with stage IV (Tx, N2, M1c) non-small cell lung cancer, adenocarcinoma.  He presented with a solitary brain metastasis in addition to right hilar and mediastinal lymphadenopathy.  He was diagnosed in August 2021.     The patient is status post right craniotomy with resection of the solitary brain metastasis with SRS under the care of Dr. MLisbeth Renshawand Dr. CChristella Noa    He completed weekly concurrent chemoradiation with carboplatin for an AUC of 2 and paclitaxel 45 mg per metered square.  He is status post 5 cycles.  He tolerated it well except for some dysphagia/odynophagia.   The patient then had evidence of new brain metastases.  He is status post SRS to the new lesions on 04/22/2020 under the care of Dr. MLisbeth Renshaw   The patient is currently undergoing immunotherapy with Libtayo IV every 3 weeks.  He is status post 11 cycles and tolerated it well   Labs were reviewed. Recommend that he  proceed with cycle #12 today as scheduled.   We will see him back for a follow up visit in 3 weeks for evaluation before starting cycle #13.  For the joint pain, advised to alternative between ibuprofen and tylenol if needed. Also advised to try salonpas patches or he  can try topical Voltaren cream if needed.   His potassium was slightly low. He was given a handout on potassium rich food and advised to increase his potassium uptake.   The patient was advised to call immediately if he has any concerning symptoms in the interval. The patient voices understanding of current disease status and treatment options and is in agreement with the current care plan. All questions were answered. The patient knows to call the clinic with any problems, questions or concerns. We can certainly see the patient much sooner if necessary          No orders of the defined types were placed in this encounter.    The total time spent in the appointment was 20-29 minutes this week.   Mysha Peeler L Jermichael Belmares, PA-C 12/13/20

## 2020-12-13 ENCOUNTER — Inpatient Hospital Stay: Payer: Medicare HMO

## 2020-12-13 ENCOUNTER — Other Ambulatory Visit: Payer: Self-pay

## 2020-12-13 ENCOUNTER — Inpatient Hospital Stay: Payer: Medicare HMO | Attending: Physician Assistant | Admitting: Physician Assistant

## 2020-12-13 VITALS — BP 155/91 | HR 90 | Temp 96.4°F | Resp 19 | Ht 73.0 in | Wt 202.7 lb

## 2020-12-13 DIAGNOSIS — I10 Essential (primary) hypertension: Secondary | ICD-10-CM | POA: Insufficient documentation

## 2020-12-13 DIAGNOSIS — Z5112 Encounter for antineoplastic immunotherapy: Secondary | ICD-10-CM

## 2020-12-13 DIAGNOSIS — C778 Secondary and unspecified malignant neoplasm of lymph nodes of multiple regions: Secondary | ICD-10-CM | POA: Diagnosis not present

## 2020-12-13 DIAGNOSIS — F17210 Nicotine dependence, cigarettes, uncomplicated: Secondary | ICD-10-CM | POA: Insufficient documentation

## 2020-12-13 DIAGNOSIS — M2550 Pain in unspecified joint: Secondary | ICD-10-CM | POA: Insufficient documentation

## 2020-12-13 DIAGNOSIS — Z79899 Other long term (current) drug therapy: Secondary | ICD-10-CM | POA: Diagnosis not present

## 2020-12-13 DIAGNOSIS — Z923 Personal history of irradiation: Secondary | ICD-10-CM | POA: Insufficient documentation

## 2020-12-13 DIAGNOSIS — I700 Atherosclerosis of aorta: Secondary | ICD-10-CM | POA: Diagnosis not present

## 2020-12-13 DIAGNOSIS — E119 Type 2 diabetes mellitus without complications: Secondary | ICD-10-CM | POA: Diagnosis not present

## 2020-12-13 DIAGNOSIS — C3491 Malignant neoplasm of unspecified part of right bronchus or lung: Secondary | ICD-10-CM

## 2020-12-13 DIAGNOSIS — J984 Other disorders of lung: Secondary | ICD-10-CM | POA: Diagnosis not present

## 2020-12-13 DIAGNOSIS — C3490 Malignant neoplasm of unspecified part of unspecified bronchus or lung: Secondary | ICD-10-CM | POA: Diagnosis not present

## 2020-12-13 DIAGNOSIS — Z9221 Personal history of antineoplastic chemotherapy: Secondary | ICD-10-CM | POA: Diagnosis not present

## 2020-12-13 DIAGNOSIS — C7931 Secondary malignant neoplasm of brain: Secondary | ICD-10-CM | POA: Diagnosis not present

## 2020-12-13 DIAGNOSIS — C799 Secondary malignant neoplasm of unspecified site: Secondary | ICD-10-CM

## 2020-12-13 DIAGNOSIS — K4090 Unilateral inguinal hernia, without obstruction or gangrene, not specified as recurrent: Secondary | ICD-10-CM | POA: Diagnosis not present

## 2020-12-13 DIAGNOSIS — R1310 Dysphagia, unspecified: Secondary | ICD-10-CM | POA: Diagnosis not present

## 2020-12-13 LAB — CMP (CANCER CENTER ONLY)
ALT: 25 U/L (ref 0–44)
AST: 21 U/L (ref 15–41)
Albumin: 3.9 g/dL (ref 3.5–5.0)
Alkaline Phosphatase: 73 U/L (ref 38–126)
Anion gap: 9 (ref 5–15)
BUN: 13 mg/dL (ref 8–23)
CO2: 24 mmol/L (ref 22–32)
Calcium: 9 mg/dL (ref 8.9–10.3)
Chloride: 110 mmol/L (ref 98–111)
Creatinine: 0.9 mg/dL (ref 0.61–1.24)
GFR, Estimated: >60 mL/min (ref >60)
Glucose, Bld: 124 mg/dL — ABNORMAL HIGH (ref 70–99)
Potassium: 3.3 mmol/L — ABNORMAL LOW (ref 3.5–5.1)
Sodium: 143 mmol/L (ref 135–145)
Total Bilirubin: 0.7 mg/dL (ref 0.3–1.2)
Total Protein: 7.3 g/dL (ref 6.5–8.1)

## 2020-12-13 LAB — CBC WITH DIFFERENTIAL (CANCER CENTER ONLY)
Abs Immature Granulocytes: 0.01 10*3/uL (ref 0.00–0.07)
Basophils Absolute: 0 10*3/uL (ref 0.0–0.1)
Basophils Relative: 1 %
Eosinophils Absolute: 0.1 10*3/uL (ref 0.0–0.5)
Eosinophils Relative: 2 %
HCT: 31.8 % — ABNORMAL LOW (ref 39.0–52.0)
Hemoglobin: 11.4 g/dL — ABNORMAL LOW (ref 13.0–17.0)
Immature Granulocytes: 0 %
Lymphocytes Relative: 23 %
Lymphs Abs: 1.2 10*3/uL (ref 0.7–4.0)
MCH: 31.4 pg (ref 26.0–34.0)
MCHC: 35.8 g/dL (ref 30.0–36.0)
MCV: 87.6 fL (ref 80.0–100.0)
Monocytes Absolute: 0.5 10*3/uL (ref 0.1–1.0)
Monocytes Relative: 9 %
Neutro Abs: 3.5 10*3/uL (ref 1.7–7.7)
Neutrophils Relative %: 65 %
Platelet Count: 257 10*3/uL (ref 150–400)
RBC: 3.63 MIL/uL — ABNORMAL LOW (ref 4.22–5.81)
RDW: 13.8 % (ref 11.5–15.5)
WBC Count: 5.3 10*3/uL (ref 4.0–10.5)
nRBC: 0 % (ref 0.0–0.2)

## 2020-12-13 MED ORDER — SODIUM CHLORIDE 0.9 % IV SOLN
350.0000 mg | Freq: Once | INTRAVENOUS | Status: AC
  Administered 2020-12-13: 350 mg via INTRAVENOUS
  Filled 2020-12-13: qty 7

## 2020-12-13 MED ORDER — SODIUM CHLORIDE 0.9% FLUSH
10.0000 mL | INTRAVENOUS | Status: DC | PRN
  Administered 2020-12-13: 10 mL
  Filled 2020-12-13: qty 10

## 2020-12-13 MED ORDER — SODIUM CHLORIDE 0.9 % IV SOLN
Freq: Once | INTRAVENOUS | Status: AC
  Filled 2020-12-13: qty 250

## 2020-12-13 MED ORDER — HEPARIN SOD (PORK) LOCK FLUSH 100 UNIT/ML IV SOLN
500.0000 [IU] | Freq: Once | INTRAVENOUS | Status: AC | PRN
  Administered 2020-12-13: 500 [IU]
  Filled 2020-12-13: qty 5

## 2020-12-13 NOTE — Patient Instructions (Signed)
[facility]   Discharge Instructions: Thank you for choosing [facility] to provide your oncology and hematology care.   If you have a lab appointment with the [facility], please go directly to the [facility] and check in at the registration area.   Wear comfortable clothing and clothing appropriate for easy access to any Portacath or PICC line.   We strive to give you quality time with your provider. You may need to reschedule your appointment if you arrive late (15 or more minutes).  Arriving late affects you and other patients whose appointments are after yours.  Also, if you miss three or more appointments without notifying the office, you may be dismissed from the clinic at the provider's discretion.      For prescription refill requests, have your pharmacy contact our office and allow 72 hours for refills to be completed.    Today you received the following chemotherapy and/or immunotherapy agents: CEMIPLIMAB.      To help prevent nausea and vomiting after your treatment, we encourage you to take your nausea medication as directed.  BELOW ARE SYMPTOMS THAT SHOULD BE REPORTED IMMEDIATELY: *FEVER GREATER THAN 100.4 F (38 C) OR HIGHER *CHILLS OR SWEATING *NAUSEA AND VOMITING THAT IS NOT CONTROLLED WITH YOUR NAUSEA MEDICATION *UNUSUAL SHORTNESS OF BREATH *UNUSUAL BRUISING OR BLEEDING *URINARY PROBLEMS (pain or burning when urinating, or frequent urination) *BOWEL PROBLEMS (unusual diarrhea, constipation, pain near the anus) TENDERNESS IN MOUTH AND THROAT WITH OR WITHOUT PRESENCE OF ULCERS (sore throat, sores in mouth, or a toothache) UNUSUAL RASH, SWELLING OR PAIN  UNUSUAL VAGINAL DISCHARGE OR ITCHING   Items with * indicate a potential emergency and should be followed up as soon as possible or go to the Emergency Department if any problems should occur.  Please show the CHEMOTHERAPY ALERT CARD or IMMUNOTHERAPY ALERT CARD at check-in  to the Emergency Department and triage nurse.  Should you have questions after your visit or need to cancel or reschedule your appointment, please contact [facility]  Dept: [phone]  and follow the prompts.  Office hours are 8:00 a.m. to 4:30 p.m. Monday - Friday. Please note that voicemails left after 4:00 p.m. may not be returned until the following business day.  We are closed weekends and major holidays. You have access to a nurse at all times for urgent questions. Please call the main number to the clinic Dept: [phone] and follow the prompts.   For any non-urgent questions, you may also contact your provider using MyChart. We now offer e-Visits for anyone 56 and older to request care online for non-urgent symptoms. For details visit [email or web address].   Also download the MyChart app! Go to the app store, search "MyChart", open the app, select [facility], and log in with your MyChart username and password.  Due to Covid, a mask is required upon entering the hospital/clinic. If you do not have a mask, one will be given to you upon arrival. For doctor visits, patients may have 1 support person aged 74 or older with them. For treatment visits, patients cannot have anyone with them due to current Covid guidelines and our immunocompromised population.

## 2020-12-13 NOTE — Patient Instructions (Signed)
Implanted Port Home Guide An implanted port is a device that is placed under the skin. It is usually placed in the chest. The device can be used to give IV medicine, to take blood, or for dialysis. You may have an implanted port if: You need IV medicine that would be irritating to the small veins in your hands or arms. You need IV medicines, such as antibiotics, for a long period of time. You need IV nutrition for a long period of time. You need dialysis. When you have a port, your health care provider can choose to use the port instead of veins in your arms for these procedures. You may have fewer limitations when using a port than you would if you used other types of long-term IVs, and you will likely be able to return to normal activities afteryour incision heals. An implanted port has two main parts: Reservoir. The reservoir is the part where a needle is inserted to give medicines or draw blood. The reservoir is round. After it is placed, it appears as a small, raised area under your skin. Catheter. The catheter is a thin, flexible tube that connects the reservoir to a vein. Medicine that is inserted into the reservoir goes into the catheter and then into the vein. How is my port accessed? To access your port: A numbing cream may be placed on the skin over the port site. Your health care provider will put on a mask and sterile gloves. The skin over your port will be cleaned carefully with a germ-killing soap and allowed to dry. Your health care provider will gently pinch the port and insert a needle into it. Your health care provider will check for a blood return to make sure the port is in the vein and is not clogged. If your port needs to remain accessed to get medicine continuously (constant infusion), your health care provider will place a clear bandage (dressing) over the needle site. The dressing and needle will need to be changed every week, or as told by your health care provider. What  is flushing? Flushing helps keep the port from getting clogged. Follow instructions from your health care provider about how and when to flush the port. Ports are usually flushed with saline solution or a medicine called heparin. The need for flushing will depend on how the port is used: If the port is only used from time to time to give medicines or draw blood, the port may need to be flushed: Before and after medicines have been given. Before and after blood has been drawn. As part of routine maintenance. Flushing may be recommended every 4-6 weeks. If a constant infusion is running, the port may not need to be flushed. Throw away any syringes in a disposal container that is meant for sharp items (sharps container). You can buy a sharps container from a pharmacy, or you can make one by using an empty hard plastic bottle with a cover. How long will my port stay implanted? The port can stay in for as long as your health care provider thinks it is needed. When it is time for the port to come out, a surgery will be done to remove it. The surgery will be similar to the procedure that was done to putthe port in. Follow these instructions at home:  Flush your port as told by your health care provider. If you need an infusion over several days, follow instructions from your health care provider about how to take   care of your port site. Make sure you: Wash your hands with soap and water before you change your dressing. If soap and water are not available, use alcohol-based hand sanitizer. Change your dressing as told by your health care provider. Place any used dressings or infusion bags into a plastic bag. Throw that bag in the trash. Keep the dressing that covers the needle clean and dry. Do not get it wet. Do not use scissors or sharp objects near the tube. Keep the tube clamped, unless it is being used. Check your port site every day for signs of infection. Check for: Redness, swelling, or  pain. Fluid or blood. Pus or a bad smell. Protect the skin around the port site. Avoid wearing bra straps that rub or irritate the site. Protect the skin around your port from seat belts. Place a soft pad over your chest if needed. Bathe or shower as told by your health care provider. The site may get wet as long as you are not actively receiving an infusion. Return to your normal activities as told by your health care provider. Ask your health care provider what activities are safe for you. Carry a medical alert card or wear a medical alert bracelet at all times. This will let health care providers know that you have an implanted port in case of an emergency. Get help right away if: You have redness, swelling, or pain at the port site. You have fluid or blood coming from your port site. You have pus or a bad smell coming from the port site. You have a fever. Summary Implanted ports are usually placed in the chest for long-term IV access. Follow instructions from your health care provider about flushing the port and changing bandages (dressings). Take care of the area around your port by avoiding clothing that puts pressure on the area, and by watching for signs of infection. Protect the skin around your port from seat belts. Place a soft pad over your chest if needed. Get help right away if you have a fever or you have redness, swelling, pain, drainage, or a bad smell at the port site. This information is not intended to replace advice given to you by your health care provider. Make sure you discuss any questions you have with your healthcare provider. Document Revised: 09/14/2019 Document Reviewed: 09/14/2019 Elsevier Patient Education  2022 Elsevier Inc.  

## 2020-12-21 ENCOUNTER — Ambulatory Visit: Payer: Medicare HMO | Admitting: Neurology

## 2020-12-29 NOTE — Progress Notes (Signed)
[facility] OFFICE PROGRESS NOTE  [name], MD [place] [address]  DIAGNOSIS: Stage IV (TX, N2, M1 C) non-small cell lung cancer, adenocarcinoma diagnosed in August 2021 and presented with solitary brain metastasis in addition to mediastinal lymphadenopathy.   PDL1 Expression 70%   Molecular Biomarkers: No actionable mutations  PRIOR THERAPY: 1) Status post right craniotomy with tumor resection followed by SRS to solitary brain metastasis under the care of Dr. Lisbeth Renshaw and Dr. Sherral Hammers. 2) Concurrent chemoradiation with weekly carboplatin for AUC of 2 and paclitaxel 45 mg/M2.  First dose 02/01/2020. Status post 5 cycles.  Last dose was given February 29, 2020. 3) SRS to the new metastatic brain lesions under the care of Dr. Lisbeth Renshaw. Completed on 04/22/20.  CURRENT THERAPY:  First-line treatment with immunotherapy with Libtayo (Cempilimab) 350 mg IV every 3 weeks.  First dose April 29, 2020. Status post 11 cycles.  INTERVAL HISTORY: Jared Shea. 65 y.o. male returns to the clinic today for a follow up visit . The patient is feeling fair today without any concerning complaints except for continued generalized poly arthralgias. He only takes tylenol two tablets daily. We previously recommended alternating tylenol and ibuprofen if needed but he is reluctant to do so and says that it does not work. He reports right knee pain and shoulder pain are the worse. Denies history of osteoarthritis and states this started after treatment. I recommended voltaren at his last appointment but states he tried it a few months ago and it didn't work.  Otherwise, besides the joint pain, he is feeling well. The patient denies recent fevers, chills, or weight loss. He reports he has intermittent night sweats. He states his breathing is good. No recent hemoptysis. Denies chest pain. Reports some baseline dyspnea on exertion. He denies any nausea, vomiting, diarrhea, or  constipation. He denies any rashes or skin changes. His headaches have improved since being placed on medications for his headaches. He states he now just has headaches once in awhile. He follows closely with neuro-oncology for his history of metastatic disease to the brain. He is here today for evaluation before starting cycle #12.   MEDICAL HISTORY: Past Medical History:  Diagnosis Date   Brain mass    Bursitis of right hip    Chest pain 08/12/2018   Chronic cough    Dependence on nicotine from cigarettes    Diabetes mellitus without complication ([place])    Essential hypertension 07/28/2018   nscl ca dx'd 12/2019   Seizures ([place])    Viral illness 03/23/2019    ALLERGIES:  is allergic to penicillins.  MEDICATIONS:  Current Outpatient Medications  Medication Sig Dispense Refill   HYDROcodone-acetaminophen (NORCO) 5-325 MG tablet Take 1 tablet by mouth every 6 (six) hours as needed for moderate pain or severe pain. 20 tablet 0   ibuprofen (ADVIL) 200 MG tablet Take 400 mg by mouth every 6 (six) hours as needed for mild pain.     levETIRAcetam (KEPPRA) 750 MG tablet Take 1 tablet (750 mg total) by mouth 2 (two) times daily. 180 tablet 4   losartan (COZAAR) 25 MG tablet Take 1 tablet (25 mg total) by mouth daily. 30 tablet 11   Melatonin 10 MG TABS Take 10 mg by mouth at bedtime as needed (sleep).     nortriptyline (PAMELOR) 25 MG capsule Take 2 capsules (50 mg total) by mouth at bedtime. 60 capsule 11   pseudoephedrine-acetaminophen (TYLENOL SINUS) 30-500 MG TABS tablet Take  1 tablet by mouth every 4 (four) hours as needed (sinus pressure).     SUMAtriptan (IMITREX) 25 MG tablet Take 1 tablet (25 mg total) by mouth every 2 (two) hours as needed for migraine. May repeat in 2 hours if headache persists or recurs. (Patient not taking: No sig reported) 10 tablet 6   No current facility-administered medications for this visit.    SURGICAL HISTORY:  Past Surgical History:  Procedure Laterality  Date   APPLICATION OF CRANIAL NAVIGATION N/A 01/07/2020   Procedure: APPLICATION OF CRANIAL NAVIGATION;  Surgeon: Ashok Pall, MD;  Location: [facility];  Service: Neurosurgery;  Laterality: N/A;   CRANIOTOMY Right 01/07/2020   Procedure: RIGHT CRANIOTOMY FOR TUMOR RESECTION;  Surgeon: Ashok Pall, MD;  Location: [facility];  Service: Neurosurgery;  Laterality: Right;  rm 21   ENDOBRONCHIAL ULTRASOUND N/A 12/21/2019   Procedure: ENDOBRONCHIAL ULTRASOUND;  Surgeon: Laurin Coder, MD;  Location: [facility];  Service: Pulmonary;  Laterality: N/A;   FINE NEEDLE ASPIRATION  12/21/2019   Procedure: FINE NEEDLE ASPIRATION (FNA) LINEAR;  Surgeon: Laurin Coder, MD;  Location: [facility];  Service: Pulmonary;;   IR IMAGING GUIDED PORT INSERTION  03/03/2020   NO PAST SURGERIES     VIDEO BRONCHOSCOPY N/A 12/21/2019   Procedure: VIDEO BRONCHOSCOPY WITHOUT FLUORO;  Surgeon: Laurin Coder, MD;  Location: [facility];  Service: Pulmonary;  Laterality: N/A;    REVIEW OF SYSTEMS:   Review of Systems  Constitutional: Positive for fatigue. Negative for appetite change, chills, fever and unexpected weight change.  HENT: Negative for mouth sores, nosebleeds, sore throat and trouble swallowing.   Eyes: Negative for eye problems and icterus.  Respiratory: Negative for cough, hemoptysis, shortness of breath and wheezing.   Cardiovascular: Negative for chest pain and leg swelling.  Gastrointestinal: Negative for abdominal pain, constipation, diarrhea, nausea and vomiting.  Genitourinary: Negative for bladder incontinence, difficulty urinating, dysuria, frequency and hematuria.   Musculoskeletal: Positive for polyarthralgia. Negative for back pain, gait problem, neck pain and neck stiffness.  Skin: Negative for itching and rash.  Neurological: Negative for dizziness, extremity weakness, gait problem, headaches, light-headedness and seizures.  Hematological: Negative for adenopathy. Does not bruise/bleed  easily.  Psychiatric/Behavioral: Negative for confusion, depression and sleep disturbance. The patient is not nervous/anxious.      PHYSICAL EXAMINATION:  Blood pressure 136/87, pulse 90, temperature 97.6 F (36.4 C), temperature source Tympanic, resp. rate 18, weight 200 lb 7 oz (90.9 kg), SpO2 100 %.  ECOG PERFORMANCE STATUS: 1  Physical Exam  Constitutional: Oriented to person, place, and time and well-developed, well-nourished, and in no distress.  HENT:  Head: Normocephalic and atraumatic.  Mouth/Throat: Oropharynx is clear and moist. No oropharyngeal exudate.  Eyes: Conjunctivae are normal. Right eye exhibits no discharge. Left eye exhibits no discharge. No scleral icterus.  Neck: Normal range of motion. Neck supple.  Cardiovascular: Normal rate, regular rhythm, normal heart sounds and intact distal pulses.   Pulmonary/Chest: Effort normal and breath sounds normal. No respiratory distress. No wheezes. No rales.  Abdominal: Soft. Bowel sounds are normal. Exhibits no distension and no mass. There is no tenderness.  Musculoskeletal: Normal range of motion. Exhibits no edema.  Lymphadenopathy:    No cervical adenopathy.  Neurological: Alert and oriented to person, place, and time. Exhibits normal muscle tone. Gait normal. Coordination normal.  Skin: Skin is warm and dry. No rash noted. Not diaphoretic. No erythema. No pallor.  Psychiatric: Mood, memory and judgment normal.  Vitals reviewed.  LABORATORY DATA: Lab Results  Component Value Date   WBC 5.9 01/02/2021   HGB 12.9 (L) 01/02/2021   HCT 36.9 (L) 01/02/2021   MCV 88.9 01/02/2021   PLT 296 01/02/2021      Chemistry      Component Value Date/Time   NA 141 01/02/2021 0941   NA 140 11/23/2019 1516   K 4.1 01/02/2021 0941   CL 108 01/02/2021 0941   CO2 24 01/02/2021 0941   BUN 15 01/02/2021 0941   BUN 14 11/23/2019 1516   CREATININE 0.85 01/02/2021 0941      Component Value Date/Time   CALCIUM 9.3 01/02/2021 0941    ALKPHOS 74 01/02/2021 0941   AST 28 01/02/2021 0941   ALT 34 01/02/2021 0941   BILITOT 0.6 01/02/2021 0941       RADIOGRAPHIC STUDIES:  No results found.   ASSESSMENT/PLAN:  This is a very pleasant 65 year old African-American male diagnosed with stage IV (Tx, N2, M1c) non-small cell lung cancer, adenocarcinoma.  He presented with a solitary brain metastasis in addition to right hilar and mediastinal lymphadenopathy.  He was diagnosed in August 2021.     The patient is status post right craniotomy with resection of the solitary brain metastasis with SRS under the care of Dr. Lisbeth Renshaw and Dr. Christella Noa.    He completed weekly concurrent chemoradiation with carboplatin for an AUC of 2 and paclitaxel 45 mg per metered square.  He is status post 5 cycles.  He tolerated it well except for some dysphagia/odynophagia.   The patient then had evidence of new brain metastases.  He is status post SRS to the new lesions on 04/22/2020 under the care of Dr. Lisbeth Renshaw.   The patient is currently undergoing immunotherapy with Libtayo IV every 3 weeks.  He is status post 11 cycles and tolerated it well   Labs were reviewed. Recommend that he proceed with cycle #12 today as scheduled.   We will see him back for a follow up visit in 3 weeks for evaluation before starting cycle #13.   For the joint pain, advised to alternative between ibuprofen and tylenol if needed. I will give him 20 tablets of norco for his joint pain. Also advised to try salonpas patches or he can try topical Voltaren cream if needed. He has colace as pain medication can increase the risk of constipation. I also discussed that norco contains tylenol so he should be cautious not to exceed the daily dose of tylenol. If he continues to have significant joint pain, discussed with Dr. Julien Nordmann and he may need to discontinue treatment.    I will arrange for a restaging CT scan of the chest, abdomen, and pelvis prior to starting his next cycle of  treatment.   The patient was advised to call immediately if she has any concerning symptoms in the interval. The patient voices understanding of current disease status and treatment options and is in agreement with the current care plan. All questions were answered. The patient knows to call the clinic with any problems, questions or concerns. We can certainly see the patient much sooner if necessary   Orders Placed This Encounter  Procedures   CT Chest W Contrast    Standing Status:   Future    Standing Expiration Date:   01/02/2022    Order Specific Question:   If indicated for the ordered procedure, I authorize the administration of contrast media per Radiology protocol    Answer:   Yes  Order Specific Question:   Preferred imaging location?    Answer:   [facility]   CT Abdomen Pelvis W Contrast    Standing Status:   Future    Standing Expiration Date:   01/02/2022    Order Specific Question:   If indicated for the ordered procedure, I authorize the administration of contrast media per Radiology protocol    Answer:   Yes    Order Specific Question:   Preferred imaging location?    Answer:   [facility]    Order Specific Question:   Is Oral Contrast requested for this exam?    Answer:   Yes, Per Radiology protocol     The total time spent in the appointment was 20-29 minutes.   Kindell Strada L Jadore Veals, PA-C 01/02/21

## 2021-01-02 ENCOUNTER — Inpatient Hospital Stay: Payer: Medicare HMO

## 2021-01-02 ENCOUNTER — Other Ambulatory Visit: Payer: Self-pay

## 2021-01-02 ENCOUNTER — Inpatient Hospital Stay (HOSPITAL_BASED_OUTPATIENT_CLINIC_OR_DEPARTMENT_OTHER): Payer: Medicare HMO | Admitting: Physician Assistant

## 2021-01-02 ENCOUNTER — Other Ambulatory Visit: Payer: Medicare HMO

## 2021-01-02 VITALS — BP 136/87 | HR 90 | Temp 97.6°F | Resp 18 | Wt 200.4 lb

## 2021-01-02 DIAGNOSIS — Z5112 Encounter for antineoplastic immunotherapy: Secondary | ICD-10-CM | POA: Diagnosis not present

## 2021-01-02 DIAGNOSIS — C3491 Malignant neoplasm of unspecified part of right bronchus or lung: Secondary | ICD-10-CM

## 2021-01-02 DIAGNOSIS — Z95828 Presence of other vascular implants and grafts: Secondary | ICD-10-CM

## 2021-01-02 DIAGNOSIS — M2550 Pain in unspecified joint: Secondary | ICD-10-CM

## 2021-01-02 DIAGNOSIS — C3490 Malignant neoplasm of unspecified part of unspecified bronchus or lung: Secondary | ICD-10-CM | POA: Diagnosis not present

## 2021-01-02 DIAGNOSIS — C799 Secondary malignant neoplasm of unspecified site: Secondary | ICD-10-CM

## 2021-01-02 LAB — CBC WITH DIFFERENTIAL (CANCER CENTER ONLY)
Abs Immature Granulocytes: 0.03 10*3/uL (ref 0.00–0.07)
Basophils Absolute: 0 10*3/uL (ref 0.0–0.1)
Basophils Relative: 1 %
Eosinophils Absolute: 0.1 10*3/uL (ref 0.0–0.5)
Eosinophils Relative: 2 %
HCT: 36.9 % — ABNORMAL LOW (ref 39.0–52.0)
Hemoglobin: 12.9 g/dL — ABNORMAL LOW (ref 13.0–17.0)
Immature Granulocytes: 1 %
Lymphocytes Relative: 32 %
Lymphs Abs: 1.9 10*3/uL (ref 0.7–4.0)
MCH: 31.1 pg (ref 26.0–34.0)
MCHC: 35 g/dL (ref 30.0–36.0)
MCV: 88.9 fL (ref 80.0–100.0)
Monocytes Absolute: 0.4 10*3/uL (ref 0.1–1.0)
Monocytes Relative: 7 %
Neutro Abs: 3.3 10*3/uL (ref 1.7–7.7)
Neutrophils Relative %: 57 %
Platelet Count: 296 10*3/uL (ref 150–400)
RBC: 4.15 MIL/uL — ABNORMAL LOW (ref 4.22–5.81)
RDW: 14 % (ref 11.5–15.5)
WBC Count: 5.9 10*3/uL (ref 4.0–10.5)
nRBC: 0 % (ref 0.0–0.2)

## 2021-01-02 LAB — CMP (CANCER CENTER ONLY)
ALT: 34 U/L (ref 0–44)
AST: 28 U/L (ref 15–41)
Albumin: 4.4 g/dL (ref 3.5–5.0)
Alkaline Phosphatase: 74 U/L (ref 38–126)
Anion gap: 9 (ref 5–15)
BUN: 15 mg/dL (ref 8–23)
CO2: 24 mmol/L (ref 22–32)
Calcium: 9.3 mg/dL (ref 8.9–10.3)
Chloride: 108 mmol/L (ref 98–111)
Creatinine: 0.85 mg/dL (ref 0.61–1.24)
GFR, Estimated: >60 mL/min (ref >60)
Glucose, Bld: 128 mg/dL — ABNORMAL HIGH (ref 70–99)
Potassium: 4.1 mmol/L (ref 3.5–5.1)
Sodium: 141 mmol/L (ref 135–145)
Total Bilirubin: 0.6 mg/dL (ref 0.3–1.2)
Total Protein: 8.1 g/dL (ref 6.5–8.1)

## 2021-01-02 LAB — TSH: TSH: 1.792 u[IU]/mL (ref 0.320–4.118)

## 2021-01-02 MED ORDER — HEPARIN SOD (PORK) LOCK FLUSH 100 UNIT/ML IV SOLN
500.0000 [IU] | Freq: Once | INTRAVENOUS | Status: AC | PRN
  Administered 2021-01-02: 500 [IU]

## 2021-01-02 MED ORDER — HYDROCODONE-ACETAMINOPHEN 5-325 MG PO TABS: 1.0000 | ORAL_TABLET | Freq: Four times a day (QID) | ORAL | 0 refills | Status: DC | PRN

## 2021-01-02 MED ORDER — SODIUM CHLORIDE 0.9 % IV SOLN
350.0000 mg | Freq: Once | INTRAVENOUS | Status: AC
Start: 2021-01-02 — End: 2021-01-02
  Administered 2021-01-02: 350 mg via INTRAVENOUS
  Filled 2021-01-02: qty 7

## 2021-01-02 MED ORDER — SODIUM CHLORIDE 0.9% FLUSH
10.0000 mL | INTRAVENOUS | Status: DC | PRN
Start: 2021-01-02 — End: 2021-01-02
  Administered 2021-01-02: 10 mL

## 2021-01-02 MED ORDER — SODIUM CHLORIDE 0.9% FLUSH
10.0000 mL | Freq: Once | INTRAVENOUS | Status: AC
  Administered 2021-01-02: 10 mL

## 2021-01-02 MED ORDER — SODIUM CHLORIDE 0.9 % IV SOLN: Freq: Once | INTRAVENOUS | Status: AC

## 2021-01-02 NOTE — Patient Instructions (Signed)
[facility] ONCOLOGY  Discharge Instructions: Thank you for choosing [facility] to provide your oncology and hematology care.   If you have a lab appointment with the [facility], please go directly to the [facility] and check in at the registration area.   Wear comfortable clothing and clothing appropriate for easy access to any Portacath or PICC line.   We strive to give you quality time with your provider. You may need to reschedule your appointment if you arrive late (15 or more minutes).  Arriving late affects you and other patients whose appointments are after yours.  Also, if you miss three or more appointments without notifying the office, you may be dismissed from the clinic at the provider's discretion.      For prescription refill requests, have your pharmacy contact our office and allow 72 hours for refills to be completed.    Today you received the following chemotherapy and/or immunotherapy agent: Cemiplimab (Libtayo).   To help prevent nausea and vomiting after your treatment, we encourage you to take your nausea medication as directed.  BELOW ARE SYMPTOMS THAT SHOULD BE REPORTED IMMEDIATELY: *FEVER GREATER THAN 100.4 F (38 C) OR HIGHER *CHILLS OR SWEATING *NAUSEA AND VOMITING THAT IS NOT CONTROLLED WITH YOUR NAUSEA MEDICATION *UNUSUAL SHORTNESS OF BREATH *UNUSUAL BRUISING OR BLEEDING *URINARY PROBLEMS (pain or burning when urinating, or frequent urination) *BOWEL PROBLEMS (unusual diarrhea, constipation, pain near the anus) TENDERNESS IN MOUTH AND THROAT WITH OR WITHOUT PRESENCE OF ULCERS (sore throat, sores in mouth, or a toothache) UNUSUAL RASH, SWELLING OR PAIN  UNUSUAL VAGINAL DISCHARGE OR ITCHING   Items with * indicate a potential emergency and should be followed up as soon as possible or go to the Emergency Department if any problems should occur.  Please show the CHEMOTHERAPY ALERT CARD or IMMUNOTHERAPY ALERT CARD at  check-in to the Emergency Department and triage nurse.  Should you have questions after your visit or need to cancel or reschedule your appointment, please contact [facility]  Dept: [phone]  and follow the prompts.  Office hours are 8:00 a.m. to 4:30 p.m. Monday - Friday. Please note that voicemails left after 4:00 p.m. may not be returned until the following business day.  We are closed weekends and major holidays. You have access to a nurse at all times for urgent questions. Please call the main number to the clinic Dept: [phone] and follow the prompts.   For any non-urgent questions, you may also contact your provider using MyChart. We now offer e-Visits for anyone 9 and older to request care online for non-urgent symptoms. For details visit [email or web address].   Also download the MyChart app! Go to the app store, search "MyChart", open the app, select [facility], and log in with your MyChart username and password.  Due to Covid, a mask is required upon entering the hospital/clinic. If you do not have a mask, one will be given to you upon arrival. For doctor visits, patients may have 1 support person aged 76 or older with them. For treatment visits, patients cannot have anyone with them due to current Covid guidelines and our immunocompromised population.

## 2021-01-04 ENCOUNTER — Telehealth: Payer: Self-pay

## 2021-01-04 NOTE — Telephone Encounter (Signed)
Pt walked into the [facility] with no appt requesting to speak to me. He states he is in a lot of joint pain.  I spoke with the pt again at length regarding taking his Tylenol and Motrin and his newly prescribed Hydrocodone. Pt again, states he doesn't want to "take all this medication". I shared with the pt that he was feeling better, though not 100%, when he was taking the Motrin/Tylenol and now he has Hydrocodone as well for break through pain.   I was able to get the pt to agree to take his medications as discussed for at least a week. I advised the pt I will call him in once week to see how he is doing. Pt states he has run out of Motrin and was advised pt please buy some more.  I have called called pts sister/Caregiver, Jared Shea, as well and left her a detailed message with this information.

## 2021-01-12 ENCOUNTER — Telehealth: Payer: Self-pay

## 2021-01-12 NOTE — Telephone Encounter (Signed)
I followed up with pt today to check to see how his pain was doing. He states his pain has been tolerable as long as he alternates Motrin and Tylenol. Pt states he take 1-2 tablets of the Norco a day as well, which has helped him a lot. Pt understands to give [place] a call should he need a refill or further assistance.

## 2021-01-18 ENCOUNTER — Telehealth: Payer: Self-pay | Admitting: Medical Oncology

## 2021-01-18 ENCOUNTER — Other Ambulatory Visit: Payer: Self-pay | Admitting: Physician Assistant

## 2021-01-18 DIAGNOSIS — C3490 Malignant neoplasm of unspecified part of unspecified bronchus or lung: Secondary | ICD-10-CM

## 2021-01-18 MED ORDER — HYDROCODONE-ACETAMINOPHEN 5-325 MG PO TABS: 1.0000 | ORAL_TABLET | Freq: Four times a day (QID) | ORAL | 0 refills | Status: DC | PRN

## 2021-01-18 NOTE — Telephone Encounter (Signed)
Refill request for Hydrocodone.  

## 2021-01-20 ENCOUNTER — Ambulatory Visit (HOSPITAL_COMMUNITY)
Admission: RE | Admit: 2021-01-20 | Discharge: 2021-01-20 | Disposition: A | Discharge status: Home / Self Care | Payer: Medicare HMO | Source: Ambulatory Visit | Attending: Physician Assistant | Admitting: Physician Assistant

## 2021-01-20 ENCOUNTER — Telehealth: Payer: Self-pay

## 2021-01-20 ENCOUNTER — Other Ambulatory Visit: Payer: Self-pay

## 2021-01-20 ENCOUNTER — Encounter (HOSPITAL_COMMUNITY): Payer: Self-pay

## 2021-01-20 DIAGNOSIS — C3490 Malignant neoplasm of unspecified part of unspecified bronchus or lung: Secondary | ICD-10-CM

## 2021-01-20 MED ORDER — IOHEXOL 350 MG/ML SOLN
100.0000 mL | Freq: Once | INTRAVENOUS | Status: AC | PRN
  Administered 2021-01-20: 80 mL via INTRAVENOUS

## 2021-01-20 MED ORDER — HEPARIN SOD (PORK) LOCK FLUSH 100 UNIT/ML IV SOLN
INTRAVENOUS | Status: AC
  Administered 2021-01-20: 500 [IU]
  Filled 2021-01-20: qty 5

## 2021-01-20 MED ORDER — HEPARIN SOD (PORK) LOCK FLUSH 100 UNIT/ML IV SOLN: 500.0000 [IU] | Freq: Once | INTRAVENOUS | Status: DC

## 2021-01-20 NOTE — Telephone Encounter (Signed)
Pt presented to the CC after his CT and shared he has seen come blood in his mucus when he clears his throat. Pt describes it as brown streaks and this occurs about twice a day for the last week. Pt denies dizziness, abdominal pain, fever, CP and change in SOB. Pt recently had a chest/abd/pelvis CT and results are pending at this time. We have pt scheduled for a follow-up on Tuesday 01/24/21. I have advised the pt if he sees a increase in blood when he coughs, if it becomes bright red or he begins to feel SOB or dizzy, please ger to the nearest ER. Pt expressed understanding of this information.

## 2021-01-24 ENCOUNTER — Other Ambulatory Visit: Payer: Self-pay

## 2021-01-24 ENCOUNTER — Inpatient Hospital Stay: Payer: Medicare HMO

## 2021-01-24 ENCOUNTER — Inpatient Hospital Stay: Payer: Medicare HMO | Attending: Internal Medicine | Admitting: Internal Medicine

## 2021-01-24 ENCOUNTER — Other Ambulatory Visit: Payer: Medicare HMO

## 2021-01-24 VITALS — BP 170/87 | HR 78

## 2021-01-24 VITALS — BP 156/91 | HR 93 | Temp 97.2°F | Resp 19 | Ht 73.0 in | Wt 203.5 lb

## 2021-01-24 DIAGNOSIS — Z79899 Other long term (current) drug therapy: Secondary | ICD-10-CM | POA: Diagnosis not present

## 2021-01-24 DIAGNOSIS — F17210 Nicotine dependence, cigarettes, uncomplicated: Secondary | ICD-10-CM | POA: Insufficient documentation

## 2021-01-24 DIAGNOSIS — R1310 Dysphagia, unspecified: Secondary | ICD-10-CM | POA: Diagnosis not present

## 2021-01-24 DIAGNOSIS — Z791 Long term (current) use of non-steroidal anti-inflammatories (NSAID): Secondary | ICD-10-CM | POA: Insufficient documentation

## 2021-01-24 DIAGNOSIS — E119 Type 2 diabetes mellitus without complications: Secondary | ICD-10-CM | POA: Diagnosis not present

## 2021-01-24 DIAGNOSIS — M4726 Other spondylosis with radiculopathy, lumbar region: Secondary | ICD-10-CM | POA: Diagnosis not present

## 2021-01-24 DIAGNOSIS — M2550 Pain in unspecified joint: Secondary | ICD-10-CM | POA: Diagnosis not present

## 2021-01-24 DIAGNOSIS — Z923 Personal history of irradiation: Secondary | ICD-10-CM | POA: Insufficient documentation

## 2021-01-24 DIAGNOSIS — J432 Centrilobular emphysema: Secondary | ICD-10-CM | POA: Insufficient documentation

## 2021-01-24 DIAGNOSIS — C3491 Malignant neoplasm of unspecified part of right bronchus or lung: Secondary | ICD-10-CM

## 2021-01-24 DIAGNOSIS — I2510 Atherosclerotic heart disease of native coronary artery without angina pectoris: Secondary | ICD-10-CM | POA: Diagnosis not present

## 2021-01-24 DIAGNOSIS — K5730 Diverticulosis of large intestine without perforation or abscess without bleeding: Secondary | ICD-10-CM | POA: Diagnosis not present

## 2021-01-24 DIAGNOSIS — C3490 Malignant neoplasm of unspecified part of unspecified bronchus or lung: Secondary | ICD-10-CM | POA: Insufficient documentation

## 2021-01-24 DIAGNOSIS — C7931 Secondary malignant neoplasm of brain: Secondary | ICD-10-CM | POA: Insufficient documentation

## 2021-01-24 DIAGNOSIS — G40909 Epilepsy, unspecified, not intractable, without status epilepticus: Secondary | ICD-10-CM | POA: Insufficient documentation

## 2021-01-24 DIAGNOSIS — Z5112 Encounter for antineoplastic immunotherapy: Secondary | ICD-10-CM | POA: Insufficient documentation

## 2021-01-24 DIAGNOSIS — C799 Secondary malignant neoplasm of unspecified site: Secondary | ICD-10-CM

## 2021-01-24 DIAGNOSIS — I10 Essential (primary) hypertension: Secondary | ICD-10-CM | POA: Insufficient documentation

## 2021-01-24 DIAGNOSIS — Z9221 Personal history of antineoplastic chemotherapy: Secondary | ICD-10-CM | POA: Insufficient documentation

## 2021-01-24 DIAGNOSIS — M5116 Intervertebral disc disorders with radiculopathy, lumbar region: Secondary | ICD-10-CM | POA: Diagnosis not present

## 2021-01-24 DIAGNOSIS — Z95828 Presence of other vascular implants and grafts: Secondary | ICD-10-CM

## 2021-01-24 LAB — CBC WITH DIFFERENTIAL (CANCER CENTER ONLY)
Abs Immature Granulocytes: 0.03 10*3/uL (ref 0.00–0.07)
Basophils Absolute: 0 10*3/uL (ref 0.0–0.1)
Basophils Relative: 1 %
Eosinophils Absolute: 0.1 10*3/uL (ref 0.0–0.5)
Eosinophils Relative: 2 %
HCT: 32.5 % — ABNORMAL LOW (ref 39.0–52.0)
Hemoglobin: 11.5 g/dL — ABNORMAL LOW (ref 13.0–17.0)
Immature Granulocytes: 0 %
Lymphocytes Relative: 23 %
Lymphs Abs: 1.6 10*3/uL (ref 0.7–4.0)
MCH: 31.4 pg (ref 26.0–34.0)
MCHC: 35.4 g/dL (ref 30.0–36.0)
MCV: 88.8 fL (ref 80.0–100.0)
Monocytes Absolute: 0.5 10*3/uL (ref 0.1–1.0)
Monocytes Relative: 7 %
Neutro Abs: 4.6 10*3/uL (ref 1.7–7.7)
Neutrophils Relative %: 67 %
Platelet Count: 263 10*3/uL (ref 150–400)
RBC: 3.66 MIL/uL — ABNORMAL LOW (ref 4.22–5.81)
RDW: 14.4 % (ref 11.5–15.5)
WBC Count: 6.9 10*3/uL (ref 4.0–10.5)
nRBC: 0 % (ref 0.0–0.2)

## 2021-01-24 LAB — CMP (CANCER CENTER ONLY)
ALT: 30 U/L (ref 0–44)
AST: 25 U/L (ref 15–41)
Albumin: 4.2 g/dL (ref 3.5–5.0)
Alkaline Phosphatase: 71 U/L (ref 38–126)
Anion gap: 9 (ref 5–15)
BUN: 12 mg/dL (ref 8–23)
CO2: 24 mmol/L (ref 22–32)
Calcium: 9.2 mg/dL (ref 8.9–10.3)
Chloride: 111 mmol/L (ref 98–111)
Creatinine: 0.84 mg/dL (ref 0.61–1.24)
GFR, Estimated: >60 mL/min (ref >60)
Glucose, Bld: 129 mg/dL — ABNORMAL HIGH (ref 70–99)
Potassium: 3.6 mmol/L (ref 3.5–5.1)
Sodium: 144 mmol/L (ref 135–145)
Total Bilirubin: 0.8 mg/dL (ref 0.3–1.2)
Total Protein: 7.9 g/dL (ref 6.5–8.1)

## 2021-01-24 LAB — TSH: TSH: 1.631 u[IU]/mL (ref 0.350–4.500)

## 2021-01-24 MED ORDER — SODIUM CHLORIDE 0.9% FLUSH
10.0000 mL | INTRAVENOUS | Status: DC | PRN
  Administered 2021-01-24: 10 mL

## 2021-01-24 MED ORDER — HEPARIN SOD (PORK) LOCK FLUSH 100 UNIT/ML IV SOLN
500.0000 [IU] | Freq: Once | INTRAVENOUS | Status: AC | PRN
  Administered 2021-01-24: 500 [IU]

## 2021-01-24 MED ORDER — SODIUM CHLORIDE 0.9 % IV SOLN
350.0000 mg | Freq: Once | INTRAVENOUS | Status: AC
  Administered 2021-01-24: 350 mg via INTRAVENOUS
  Filled 2021-01-24: qty 7

## 2021-01-24 MED ORDER — SODIUM CHLORIDE 0.9 % IV SOLN: Freq: Once | INTRAVENOUS | Status: AC

## 2021-01-24 MED ORDER — SODIUM CHLORIDE 0.9% FLUSH
10.0000 mL | Freq: Once | INTRAVENOUS | Status: AC
  Administered 2021-01-24: 10 mL

## 2021-01-24 NOTE — Progress Notes (Signed)
[facility] [place] [phone]   [phone]  OFFICE PROGRESS NOTE  [name], MD [place] [address]  DIAGNOSIS: stage IV (TX, N2, M1 C) non-small cell lung cancer, adenocarcinoma diagnosed in August 2021 and presented with solitary brain metastasis in addition to mediastinal lymphadenopathy.  PDL1 Expression 70%   Molecular Biomarkers:  Tumor Mutational Burden - 52 Muts/Mb Microsatellite status - MS-Stable Genomic Findings For a complete list of the genes assayed, please refer to the Appendix. NF1 E1694* MTAP loss exons 2-8 RICTOR amplification ATRX A419V BRAF K483E CDKN2A/B CDKN2A loss, CDKN2B loss DNMT3A E205* FGF10 amplification NTRK1 amplification - equivocal? 7 Disease relevant genes with no reportable alterations: ALK, EGFR, ERBB2, KRAS, MET, RET, ROS1    PRIOR THERAPY:  1) Status post right craniotomy with tumor resection followed by [facility] to solitary brain metastasis under the care of Dr. Lisbeth Renshaw and Dr. Sherral Hammers. 2) Concurrent chemoradiation with weekly carboplatin for AUC of 2 and paclitaxel 45 mg/M2.  First dose 02/01/2020. Status post 5 cycles.  Last dose was given February 29, 2020.   CURRENT THERAPY:  First-line treatment with immunotherapy with Libtayo (Cempilimab) 350 mg IV every 3 weeks.  First dose April 25, 2020.  Status post 12 cycles.  INTERVAL HISTORY: Jared Shea. 65 y.o. male returns to the clinic today for follow-up visit.  The patient is feeling fine today with no concerning complaints except for the arthralgia in the knees in addition to occasional dizzy spells that is more positional as well as headache.  He is concerned about brain metastasis.  He denied having any current chest pain, shortness of breath, cough or hemoptysis.  He denied having any fever or chills.  He has no nausea, vomiting, diarrhea or constipation.  He has no fever or chills.  He continues to tolerate his treatment with  cemiplimab fairly well.  The patient had repeat CT scan of the chest, abdomen pelvis performed recently and he is here for evaluation and discussion of his scan results.   MEDICAL HISTORY: Past Medical History:  Diagnosis Date   Brain mass    Bursitis of right hip    Chest pain 08/12/2018   Chronic cough    Dependence on nicotine from cigarettes    Diabetes mellitus without complication ([place])    Essential hypertension 07/28/2018   nscl ca dx'd 12/2019   Seizures ([place])    Viral illness 03/23/2019    ALLERGIES:  is allergic to penicillins.  MEDICATIONS:  Current Outpatient Medications  Medication Sig Dispense Refill   HYDROcodone-acetaminophen (NORCO) 5-325 MG tablet Take 1 tablet by mouth every 6 (six) hours as needed for moderate pain or severe pain. 20 tablet 0   ibuprofen (ADVIL) 200 MG tablet Take 400 mg by mouth every 6 (six) hours as needed for mild pain.     levETIRAcetam (KEPPRA) 750 MG tablet Take 1 tablet (750 mg total) by mouth 2 (two) times daily. 180 tablet 4   losartan (COZAAR) 25 MG tablet Take 1 tablet (25 mg total) by mouth daily. 30 tablet 11   Melatonin 10 MG TABS Take 10 mg by mouth at bedtime as needed (sleep).     nortriptyline (PAMELOR) 25 MG capsule Take 2 capsules (50 mg total) by mouth at bedtime. 60 capsule 11   pseudoephedrine-acetaminophen (TYLENOL SINUS) 30-500 MG TABS tablet Take 1 tablet by mouth every 4 (four) hours as needed (sinus pressure).     SUMAtriptan (IMITREX) 25 MG  tablet Take 1 tablet (25 mg total) by mouth every 2 (two) hours as needed for migraine. May repeat in 2 hours if headache persists or recurs. (Patient not taking: No sig reported) 10 tablet 6   No current facility-administered medications for this visit.    SURGICAL HISTORY:  Past Surgical History:  Procedure Laterality Date   APPLICATION OF CRANIAL NAVIGATION N/A 01/07/2020   Procedure: APPLICATION OF CRANIAL NAVIGATION;  Surgeon: Ashok Pall, MD;  Location: [facility];  Service:  Neurosurgery;  Laterality: N/A;   CRANIOTOMY Right 01/07/2020   Procedure: RIGHT CRANIOTOMY FOR TUMOR RESECTION;  Surgeon: Ashok Pall, MD;  Location: [facility];  Service: Neurosurgery;  Laterality: Right;  rm 21   ENDOBRONCHIAL ULTRASOUND N/A 12/21/2019   Procedure: ENDOBRONCHIAL ULTRASOUND;  Surgeon: Laurin Coder, MD;  Location: [facility];  Service: Pulmonary;  Laterality: N/A;   FINE NEEDLE ASPIRATION  12/21/2019   Procedure: FINE NEEDLE ASPIRATION (FNA) LINEAR;  Surgeon: Laurin Coder, MD;  Location: [facility];  Service: Pulmonary;;   IR IMAGING GUIDED PORT INSERTION  03/03/2020   NO PAST SURGERIES     VIDEO BRONCHOSCOPY N/A 12/21/2019   Procedure: VIDEO BRONCHOSCOPY WITHOUT FLUORO;  Surgeon: Laurin Coder, MD;  Location: [facility];  Service: Pulmonary;  Laterality: N/A;    REVIEW OF SYSTEMS:  Constitutional: positive for fatigue Eyes: negative Ears, nose, mouth, throat, and face: negative Respiratory: negative Cardiovascular: negative Gastrointestinal: negative Genitourinary:negative Integument/breast: negative Hematologic/lymphatic: negative Musculoskeletal:positive for arthralgias Neurological: positive for dizziness and headaches Behavioral/Psych: negative Endocrine: negative Allergic/Immunologic: negative   PHYSICAL EXAMINATION: General appearance: alert, cooperative, fatigued, and no distress Head: Normocephalic, without obvious abnormality, atraumatic Neck: no adenopathy, no JVD, supple, symmetrical, trachea midline, and thyroid not enlarged, symmetric, no tenderness/mass/nodules Lymph nodes: Cervical, supraclavicular, and axillary nodes normal. Resp: clear to auscultation bilaterally Back: symmetric, no curvature. ROM normal. No CVA tenderness. Cardio: regular rate and rhythm, S1, S2 normal, no murmur, click, rub or gallop GI: soft, non-tender; bowel sounds normal; no masses,  no organomegaly Extremities: extremities normal, atraumatic, no cyanosis or  edema Neurologic: Alert and oriented X 3, normal strength and tone. Normal symmetric reflexes. Normal coordination and gait  ECOG PERFORMANCE STATUS: 1 - Symptomatic but completely ambulatory  Blood pressure (!) 156/91, pulse 93, temperature (!) 97.2 F (36.2 C), temperature source Tympanic, resp. rate 19, height '6\' 1"'  (1.854 m), weight 203 lb 8 oz (92.3 kg), SpO2 100 %.  LABORATORY DATA: Lab Results  Component Value Date   WBC 6.9 01/24/2021   HGB 11.5 (L) 01/24/2021   HCT 32.5 (L) 01/24/2021   MCV 88.8 01/24/2021   PLT 263 01/24/2021      Chemistry      Component Value Date/Time   NA 141 01/02/2021 0941   NA 140 11/23/2019 1516   K 4.1 01/02/2021 0941   CL 108 01/02/2021 0941   CO2 24 01/02/2021 0941   BUN 15 01/02/2021 0941   BUN 14 11/23/2019 1516   CREATININE 0.85 01/02/2021 0941      Component Value Date/Time   CALCIUM 9.3 01/02/2021 0941   ALKPHOS 74 01/02/2021 0941   AST 28 01/02/2021 0941   ALT 34 01/02/2021 0941   BILITOT 0.6 01/02/2021 0941       RADIOGRAPHIC STUDIES: CT Chest W Contrast  Result Date: 01/20/2021 CLINICAL DATA:  Metastatic non-small cell lung cancer diagnosed August 2021. Hemoptysis over the last 2 weeks. Prior right craniotomy with tumor resection. EXAM: CT CHEST, ABDOMEN, AND PELVIS WITH CONTRAST  TECHNIQUE: Multidetector CT imaging of the chest, abdomen and pelvis was performed following the standard protocol during bolus administration of intravenous contrast. CONTRAST:  78m OMNIPAQUE IOHEXOL 350 MG/ML SOLN COMPARISON:  None. Multiple exams, including 11/22/2020 FINDINGS: CT CHEST FINDINGS Cardiovascular: Right Port-A-Cath tip: Right atrium. Atherosclerosis noted including the circumflex, right, and left anterior descending coronary arteries. Mediastinum/Nodes: AP window lymph node 0.7 cm in short axis on image 24 series 2, previously the same. Subcarinal lymph node mildly obscured by indistinct surrounding tissue planes but about 0.7 cm in  short axis on image 29 series 2, previously 0.8 cm. Left infrahilar densities similar to prior measuring 0.7 cm in short axis on image 32 series 2, formerly the same. Anterior left infrahilar density 0.8 cm in short axis on image 33 series 2, formerly 0.9 cm. Lungs/Pleura: Centrilobular emphysema. 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 Mild lingular scarring. Musculoskeletal: Small sclerotic lesion in the T3 vertebral body measuring 0.6 cm in diameter on image 18 series 8, unchanged from earliest available comparison of 12/14/2019 and not appreciably hypermetabolic on intervening PET-CT, likely benign. CT ABDOMEN PELVIS FINDINGS Hepatobiliary: Unremarkable Pancreas: Unremarkable Spleen: Unremarkable Adrenals/Urinary Tract: Unremarkable Stomach/Bowel: Prominent stool throughout the colon favors constipation. Mild sigmoid colon diverticulosis. Vascular/Lymphatic: Atherosclerosis is present, including aortoiliac atherosclerotic disease. Index portacaval node 1.8 cm in short axis on image 63 series 2, previously same by my measurements. Index peripancreatic/porta hepatis lymph node 1.5 cm in short axis on image 60 series 2, previously 1.6 cm my measurements. Separate peripancreatic node 1.3 cm in short axis on image 57 series 2, formerly same. Small periaortic lymph nodes are not pathologically enlarged. Reproductive: Unremarkable Other: No supplemental non-categorized findings. Musculoskeletal: Lumbar spondylosis and degenerative disc disease contributing to impingement at L2-3, L3-4, and L4-5. There is a notable disc protrusion extending caudad from the L4-5 level. IMPRESSION: 1. Stable appearance of post therapy related findings in the paramediastinal regions most confluent in the left infrahilar region. No progression or thoracic adenopathy identified. 2. Stable mildly enlarged porta hepatis/portacaval lymph nodes.  These were smaller at the time of the prior 01/01/2020 PET-CT but were not hypermetabolic on the prior PET-CT. I am uncertain if these are chronic reactive lymph nodes or if the mild enlargement is a reflection of metastatic disease. These could be surveilled or further assessed with nuclear medicine PET-CT if clinically warranted. 3. Other imaging findings of potential clinical significance: Aortic Atherosclerosis (ICD10-I70.0). Coronary atherosclerosis. Prominent stool throughout the colon favors constipation. Mild sigmoid colon diverticulosis. Multilevel lumbar impingement. Electronically Signed   By: WVan ClinesM.D.   On: 01/20/2021 11:15   CT Abdomen Pelvis W Contrast  Result Date: 01/20/2021 CLINICAL DATA:  Metastatic non-small cell lung cancer diagnosed August 2021. Hemoptysis over the last 2 weeks. Prior right craniotomy with tumor resection. EXAM: CT CHEST, ABDOMEN, AND PELVIS WITH CONTRAST TECHNIQUE: Multidetector CT imaging of the chest, abdomen and pelvis was performed following the standard protocol during bolus administration of intravenous contrast. CONTRAST:  837mOMNIPAQUE IOHEXOL 350 MG/ML SOLN COMPARISON:  None. Multiple exams, including 11/22/2020 FINDINGS: CT CHEST FINDINGS Cardiovascular: Right Port-A-Cath tip: Right atrium. Atherosclerosis noted including the circumflex, right, and left anterior descending coronary arteries. Mediastinum/Nodes: AP window lymph node 0.7 cm in short axis on image 24 series 2, previously the same. Subcarinal lymph node mildly obscured by indistinct surrounding tissue planes but about 0.7 cm in short axis on image 29 series 2, previously 0.8 cm.  Left infrahilar densities similar to prior measuring 0.7 cm in short axis on image 32 series 2, formerly the same. Anterior left infrahilar density 0.8 cm in short axis on image 33 series 2, formerly 0.9 cm. Lungs/Pleura: Centrilobular emphysema. Stable paramediastinal stranding compatible with radiation therapy  related fibrosis, stable a confluent along the left infrahilar region. Mild lingular scarring. Musculoskeletal: Small sclerotic lesion in the T3 vertebral body measuring 0.6 cm in diameter on image 18 series 8, unchanged from earliest available comparison of 12/14/2019 and not appreciably hypermetabolic on intervening PET-CT, likely benign. CT ABDOMEN PELVIS FINDINGS Hepatobiliary: Unremarkable Pancreas: Unremarkable Spleen: Unremarkable Adrenals/Urinary Tract: Unremarkable Stomach/Bowel: Prominent stool throughout the colon favors constipation. Mild sigmoid colon diverticulosis. Vascular/Lymphatic: Atherosclerosis is present, including aortoiliac atherosclerotic disease. Index portacaval node 1.8 cm in short axis on image 63 series 2, previously same by my measurements. Index peripancreatic/porta hepatis lymph node 1.5 cm in short axis on image 60 series 2, previously 1.6 cm my measurements. Separate peripancreatic node 1.3 cm in short axis on image 57 series 2, formerly same. Small periaortic lymph nodes are not pathologically enlarged. Reproductive: Unremarkable Other: No supplemental non-categorized findings. Musculoskeletal: Lumbar spondylosis and degenerative disc disease contributing to impingement at L2-3, L3-4, and L4-5. There is a notable disc protrusion extending caudad from the L4-5 level. IMPRESSION: 1. Stable appearance of post therapy related findings in the paramediastinal regions most confluent in the left infrahilar region. No progression or thoracic adenopathy identified. 2. Stable mildly enlarged porta hepatis/portacaval lymph nodes. These were smaller at the time of the prior 01/01/2020 PET-CT but were not hypermetabolic on the prior PET-CT. I am uncertain if these are chronic reactive lymph nodes or if the mild enlargement is a reflection of metastatic disease. These could be surveilled or further assessed with nuclear medicine PET-CT if clinically warranted. 3. Other imaging findings of  potential clinical significance: Aortic Atherosclerosis (ICD10-I70.0). Coronary atherosclerosis. Prominent stool throughout the colon favors constipation. Mild sigmoid colon diverticulosis. Multilevel lumbar impingement. Electronically Signed   By: Van Clines M.D.   On: 01/20/2021 11:15     ASSESSMENT AND PLAN: This is a very pleasant 65 years old African-American male recently diagnosed with a stage IV (TX, N2, M1c) non-small cell lung cancer, adenocarcinoma presented with solitary brain metastasis in addition to right hilar and mediastinal lymphadenopathy diagnosed in August 2021.  The patient is status post right craniotomy with resection of the solitary brain metastasis with SRS. His PET scan showed no evidence of metastatic disease outside the chest. The patient was treated with a course of concurrent chemoradiation with weekly carboplatin and paclitaxel status post 5 cycles.  He tolerated his treatment well except for dysphagia and odynophagia. His PD-L1 expression is 70%. The patient started treatment with immunotherapy with Cemiplimab 350 mg IV every 3 weeks status post 12 cycles.   The patient has been tolerating this treatment well with no concerning adverse effect except for the arthralgia.  He also has few dizzy spells and headache recently and concerned about brain metastasis. The patient had repeat CT scan of the chest, abdomen pelvis performed recently.  I personally and independently reviewed the scans and discussed the results with the patient today. His scan showed no concerning findings for disease progression. The patient is still concerned about the arthralgias at could be related to his immunotherapy.  I gave him the option of discontinuing the treatment or at least taking a break for the next few months but he would like to proceed with cycle  13 today as planned and he will think about this option in the future. For the dizzy spells and headache, we will arrange for the  patient to have repeat CT scan of the head with and without contrast. I will see him back for follow-up visit in 3 weeks for evaluation before the next cycle of his treatment. He was advised to call immediately if he has any concerning symptoms in the interval. The patient voices understanding of current disease status and treatment options and is in agreement with the current care plan.  All questions were answered. The patient knows to call the clinic with any problems, questions or concerns. We can certainly see the patient much sooner if necessary.   Disclaimer: This note was dictated with voice recognition software. Similar sounding words can inadvertently be transcribed and may not be corrected upon review.

## 2021-01-24 NOTE — Patient Instructions (Signed)
[facility]  Discharge Instructions: Thank you for choosing [facility] to provide your oncology and hematology care.   If you have a lab appointment with the [facility], please go directly to the [facility] and check in at the registration area.   Wear comfortable clothing and clothing appropriate for easy access to any Portacath or PICC line.   We strive to give you quality time with your provider. You may need to reschedule your appointment if you arrive late (15 or more minutes).  Arriving late affects you and other patients whose appointments are after yours.  Also, if you miss three or more appointments without notifying the office, you may be dismissed from the clinic at the provider's discretion.      For prescription refill requests, have your pharmacy contact our office and allow 72 hours for refills to be completed.    Today you received the following chemotherapy and/or immunotherapy agents: Libtayo    To help prevent nausea and vomiting after your treatment, we encourage you to take your nausea medication as directed.  BELOW ARE SYMPTOMS THAT SHOULD BE REPORTED IMMEDIATELY: *FEVER GREATER THAN 100.4 F (38 C) OR HIGHER *CHILLS OR SWEATING *NAUSEA AND VOMITING THAT IS NOT CONTROLLED WITH YOUR NAUSEA MEDICATION *UNUSUAL SHORTNESS OF BREATH *UNUSUAL BRUISING OR BLEEDING *URINARY PROBLEMS (pain or burning when urinating, or frequent urination) *BOWEL PROBLEMS (unusual diarrhea, constipation, pain near the anus) TENDERNESS IN MOUTH AND THROAT WITH OR WITHOUT PRESENCE OF ULCERS (sore throat, sores in mouth, or a toothache) UNUSUAL RASH, SWELLING OR PAIN  UNUSUAL VAGINAL DISCHARGE OR ITCHING   Items with * indicate a potential emergency and should be followed up as soon as possible or go to the Emergency Department if any problems should occur.  Please show the CHEMOTHERAPY ALERT CARD or IMMUNOTHERAPY ALERT CARD at check-in to the  Emergency Department and triage nurse.  Should you have questions after your visit or need to cancel or reschedule your appointment, please contact [facility]  Dept: [phone]  and follow the prompts.  Office hours are 8:00 a.m. to 4:30 p.m. Monday - Friday. Please note that voicemails left after 4:00 p.m. may not be returned until the following business day.  We are closed weekends and major holidays. You have access to a nurse at all times for urgent questions. Please call the main number to the clinic Dept: [phone] and follow the prompts.   For any non-urgent questions, you may also contact your provider using MyChart. We now offer e-Visits for anyone 68 and older to request care online for non-urgent symptoms. For details visit [email or web address].   Also download the MyChart app! Go to the app store, search "MyChart", open the app, select [facility], and log in with your MyChart username and password.  Due to Covid, a mask is required upon entering the hospital/clinic. If you do not have a mask, one will be given to you upon arrival. For doctor visits, patients may have 1 support person aged 50 or older with them. For treatment visits, patients cannot have anyone with them due to current Covid guidelines and our immunocompromised population.

## 2021-01-27 ENCOUNTER — Telehealth: Payer: Self-pay | Admitting: Medical Oncology

## 2021-01-27 NOTE — Telephone Encounter (Signed)
Appt date and time for next week confirmed with pt.

## 2021-01-31 ENCOUNTER — Other Ambulatory Visit: Payer: Self-pay | Admitting: Physician Assistant

## 2021-01-31 DIAGNOSIS — C3490 Malignant neoplasm of unspecified part of unspecified bronchus or lung: Secondary | ICD-10-CM

## 2021-01-31 MED ORDER — HYDROCODONE-ACETAMINOPHEN 5-325 MG PO TABS
1.0000 | ORAL_TABLET | Freq: Four times a day (QID) | ORAL | 0 refills | Status: DC | PRN
Start: 2021-01-31 — End: 2021-04-03

## 2021-02-01 ENCOUNTER — Ambulatory Visit (HOSPITAL_COMMUNITY)
Admission: RE | Admit: 2021-02-01 | Discharge: 2021-02-01 | Disposition: A | Discharge status: Home / Self Care | Payer: Medicare HMO | Source: Ambulatory Visit | Attending: Internal Medicine | Admitting: Internal Medicine

## 2021-02-01 ENCOUNTER — Other Ambulatory Visit: Payer: Self-pay

## 2021-02-01 ENCOUNTER — Encounter (HOSPITAL_COMMUNITY): Payer: Self-pay

## 2021-02-01 DIAGNOSIS — Z86011 Personal history of benign neoplasm of the brain: Secondary | ICD-10-CM | POA: Insufficient documentation

## 2021-02-01 DIAGNOSIS — C3490 Malignant neoplasm of unspecified part of unspecified bronchus or lung: Secondary | ICD-10-CM | POA: Insufficient documentation

## 2021-02-01 MED ORDER — HEPARIN SOD (PORK) LOCK FLUSH 100 UNIT/ML IV SOLN
500.0000 [IU] | Freq: Once | INTRAVENOUS | Status: AC
  Administered 2021-02-01: 500 [IU] via INTRAVENOUS

## 2021-02-01 MED ORDER — HEPARIN SOD (PORK) LOCK FLUSH 100 UNIT/ML IV SOLN
INTRAVENOUS | Status: AC
  Filled 2021-02-01: qty 5

## 2021-02-01 MED ORDER — IOHEXOL 350 MG/ML SOLN
75.0000 mL | Freq: Once | INTRAVENOUS | Status: AC | PRN
  Administered 2021-02-01: 75 mL via INTRAVENOUS

## 2021-02-06 ENCOUNTER — Telehealth: Payer: Self-pay | Admitting: Internal Medicine

## 2021-02-06 NOTE — Telephone Encounter (Signed)
R/s 10/3 to 10/5 per sch msg. Called and spoke with patient. Explained covid protocol for when he comes to the facility

## 2021-02-06 NOTE — Progress Notes (Deleted)
Jared Shea OFFICE PROGRESS NOTE  Jared Presto, MD [place] [address]  DIAGNOSIS: ***  PRIOR THERAPY:  CURRENT THERAPY:  INTERVAL HISTORY: Jared Shea. 65 y.o. male returns for *** regular *** visit for followup of ***   MEDICAL HISTORY: Past Medical History:  Diagnosis Date   Brain mass    Bursitis of right hip    Chest pain 08/12/2018   Chronic cough    Dependence on nicotine from cigarettes    Diabetes mellitus without complication ([place])    Essential hypertension 07/28/2018   nscl ca dx'd 12/2019   Seizures ([place])    Viral illness 03/23/2019    ALLERGIES:  is allergic to penicillins.  MEDICATIONS:  Current Outpatient Medications  Medication Sig Dispense Refill   HYDROcodone-acetaminophen (NORCO) 5-325 MG tablet Take 1 tablet by mouth every 6 (six) hours as needed for moderate pain or severe pain. 20 tablet 0   ibuprofen (ADVIL) 200 MG tablet Take 400 mg by mouth every 6 (six) hours as needed for mild pain.     levETIRAcetam (KEPPRA) 750 MG tablet Take 1 tablet (750 mg total) by mouth 2 (two) times daily. 180 tablet 4   losartan (COZAAR) 25 MG tablet Take 1 tablet (25 mg total) by mouth daily. 30 tablet 11   Melatonin 10 MG TABS Take 10 mg by mouth at bedtime as needed (sleep).     nortriptyline (PAMELOR) 25 MG capsule Take 2 capsules (50 mg total) by mouth at bedtime. 60 capsule 11   pseudoephedrine-acetaminophen (TYLENOL SINUS) 30-500 MG TABS tablet Take 1 tablet by mouth every 4 (four) hours as needed (sinus pressure).     SUMAtriptan (IMITREX) 25 MG tablet Take 1 tablet (25 mg total) by mouth every 2 (two) hours as needed for migraine. May repeat in 2 hours if headache persists or recurs. (Patient not taking: No sig reported) 10 tablet 6   No current facility-administered medications for this visit.    SURGICAL HISTORY:  Past Surgical History:  Procedure Laterality Date   APPLICATION OF CRANIAL NAVIGATION N/A 01/07/2020    Procedure: APPLICATION OF CRANIAL NAVIGATION;  Surgeon: Ashok Pall, MD;  Location: [facility];  Service: Neurosurgery;  Laterality: N/A;   CRANIOTOMY Right 01/07/2020   Procedure: RIGHT CRANIOTOMY FOR TUMOR RESECTION;  Surgeon: Ashok Pall, MD;  Location: [facility];  Service: Neurosurgery;  Laterality: Right;  rm 21   ENDOBRONCHIAL ULTRASOUND N/A 12/21/2019   Procedure: ENDOBRONCHIAL ULTRASOUND;  Surgeon: Laurin Coder, MD;  Location: [facility];  Service: Pulmonary;  Laterality: N/A;   FINE NEEDLE ASPIRATION  12/21/2019   Procedure: FINE NEEDLE ASPIRATION (FNA) LINEAR;  Surgeon: Laurin Coder, MD;  Location: [facility];  Service: Pulmonary;;   IR IMAGING GUIDED PORT INSERTION  03/03/2020   NO PAST SURGERIES     VIDEO BRONCHOSCOPY N/A 12/21/2019   Procedure: VIDEO BRONCHOSCOPY WITHOUT FLUORO;  Surgeon: Laurin Coder, MD;  Location: [facility];  Service: Pulmonary;  Laterality: N/A;    REVIEW OF SYSTEMS:   Review of Systems  Constitutional: Negative for appetite change, chills, fatigue, fever and unexpected weight change.  HENT:   Negative for mouth sores, nosebleeds, sore throat and trouble swallowing.   Eyes: Negative for eye problems and icterus.  Respiratory: Negative for cough, hemoptysis, shortness of breath and wheezing.   Cardiovascular: Negative for chest pain and leg swelling.  Gastrointestinal: Negative for abdominal pain, constipation, diarrhea, nausea and vomiting.  Genitourinary: Negative for bladder incontinence, difficulty  urinating, dysuria, frequency and hematuria.   Musculoskeletal: Negative for back pain, gait problem, neck pain and neck stiffness.  Skin: Negative for itching and rash.  Neurological: Negative for dizziness, extremity weakness, gait problem, headaches, light-headedness and seizures.  Hematological: Negative for adenopathy. Does not bruise/bleed easily.  Psychiatric/Behavioral: Negative for confusion, depression and sleep disturbance. The patient  is not nervous/anxious.     PHYSICAL EXAMINATION:  There were no vitals taken for this visit.  ECOG PERFORMANCE STATUS: {CHL ONC ECOG [id number]  Physical Exam  Constitutional: Oriented to person, place, and time and well-developed, well-nourished, and in no distress. No distress.  HENT:  Head: Normocephalic and atraumatic.  Mouth/Throat: Oropharynx is clear and moist. No oropharyngeal exudate.  Eyes: Conjunctivae are normal. Right eye exhibits no discharge. Left eye exhibits no discharge. No scleral icterus.  Neck: Normal range of motion. Neck supple.  Cardiovascular: Normal rate, regular rhythm, normal heart sounds and intact distal pulses.   Pulmonary/Chest: Effort normal and breath sounds normal. No respiratory distress. No wheezes. No rales.  Abdominal: Soft. Bowel sounds are normal. Exhibits no distension and no mass. There is no tenderness.  Musculoskeletal: Normal range of motion. Exhibits no edema.  Lymphadenopathy:    No cervical adenopathy.  Neurological: Alert and oriented to person, place, and time. Exhibits normal muscle tone. Gait normal. Coordination normal.  Skin: Skin is warm and dry. No rash noted. Not diaphoretic. No erythema. No pallor.  Psychiatric: Mood, memory and judgment normal.  Vitals reviewed.  LABORATORY DATA: Lab Results  Component Value Date   WBC 6.9 01/24/2021   HGB 11.5 (L) 01/24/2021   HCT 32.5 (L) 01/24/2021   MCV 88.8 01/24/2021   PLT 263 01/24/2021      Chemistry      Component Value Date/Time   NA 144 01/24/2021 0844   NA 140 11/23/2019 1516   K 3.6 01/24/2021 0844   CL 111 01/24/2021 0844   CO2 24 01/24/2021 0844   BUN 12 01/24/2021 0844   BUN 14 11/23/2019 1516   CREATININE 0.84 01/24/2021 0844      Component Value Date/Time   CALCIUM 9.2 01/24/2021 0844   ALKPHOS 71 01/24/2021 0844   AST 25 01/24/2021 0844   ALT 30 01/24/2021 0844   BILITOT 0.8 01/24/2021 0844       RADIOGRAPHIC STUDIES:  CT HEAD W & WO  CONTRAST (5MM)  Result Date: 02/01/2021 CLINICAL DATA:  Small-cell lung cancer, history of right parietal craniotomy for tumor resection. EXAM: CT HEAD WITHOUT AND WITH CONTRAST TECHNIQUE: Contiguous axial images were obtained from the base of the skull through the vertex without and with intravenous contrast CONTRAST:  29mL OMNIPAQUE IOHEXOL 350 MG/ML SOLN COMPARISON:  Brain MRI 10/29/2020 FINDINGS: Brain: Postsurgical changes reflecting right parietal craniotomy for mass resection are again seen. A small focus of encephalomalacia/gliosis in the underlying brain parenchyma is similar to the prior study. There is no definite enhancement to suggest residual or recurrent tumor. There is no other abnormal enhancement in the brain. There is no evidence of acute intracranial hemorrhage, extra-axial fluid collection, or infarct. The ventricles are stable in size. There is no midline shift. Vascular: There is calcification of the bilateral cavernous ICAs. Skull: Postsurgical changes reflecting right parietal craniotomy are again noted. The calvarium is otherwise unremarkable. Sinuses/Orbits: The imaged paranasal sinuses are clear. The globes and orbits are unremarkable. Other: None. IMPRESSION: Stable postsurgical changes reflecting right parietal craniotomy for mass resection with no definite evidence of residual or recurrent disease,  and no other area of abnormal enhancement. Note that MRI is more sensitive for detection of small intracranial metastases; consider MRI with and without contrast for subsequent staging exams. Electronically Signed   By: Valetta Mole M.D.   On: 02/01/2021 12:02   CT Chest W Contrast  Result Date: 01/20/2021 CLINICAL DATA:  Metastatic non-small cell lung cancer diagnosed August 2021. Hemoptysis over the last 2 weeks. Prior right craniotomy with tumor resection. EXAM: CT CHEST, ABDOMEN, AND PELVIS WITH CONTRAST TECHNIQUE: Multidetector CT imaging of the chest, abdomen and pelvis was  performed following the standard protocol during bolus administration of intravenous contrast. CONTRAST:  60mL OMNIPAQUE IOHEXOL 350 MG/ML SOLN COMPARISON:  None. Multiple exams, including 11/22/2020 FINDINGS: CT CHEST FINDINGS Cardiovascular: Right Port-A-Cath tip: Right atrium. Atherosclerosis noted including the circumflex, right, and left anterior descending coronary arteries. Mediastinum/Nodes: AP window lymph node 0.7 cm in short axis on image 24 series 2, previously the same. Subcarinal lymph node mildly obscured by indistinct surrounding tissue planes but about 0.7 cm in short axis on image 29 series 2, previously 0.8 cm. Left infrahilar densities similar to prior measuring 0.7 cm in short axis on image 32 series 2, formerly the same. Anterior left infrahilar density 0.8 cm in short axis on image 33 series 2, formerly 0.9 cm. Lungs/Pleura: Centrilobular emphysema. 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 Mild lingular scarring. Musculoskeletal: Small sclerotic lesion in the T3 vertebral body measuring 0.6 cm in diameter on image 18 series 8, unchanged from earliest available comparison of 12/14/2019 and not appreciably hypermetabolic on intervening PET-CT, likely benign. CT ABDOMEN PELVIS FINDINGS Hepatobiliary: Unremarkable Pancreas: Unremarkable Spleen: Unremarkable Adrenals/Urinary Tract: Unremarkable Stomach/Bowel: Prominent stool throughout the colon favors constipation. Mild sigmoid colon diverticulosis. Vascular/Lymphatic: Atherosclerosis is present, including aortoiliac atherosclerotic disease. Index portacaval node 1.8 cm in short axis on image 63 series 2, previously same by my measurements. Index peripancreatic/porta hepatis lymph node 1.5 cm in short axis on image 60 series 2, previously 1.6 cm my measurements. Separate peripancreatic node 1.3 cm in short axis on image 57 series 2, formerly same.  Small periaortic lymph nodes are not pathologically enlarged. Reproductive: Unremarkable Other: No supplemental non-categorized findings. Musculoskeletal: Lumbar spondylosis and degenerative disc disease contributing to impingement at L2-3, L3-4, and L4-5. There is a notable disc protrusion extending caudad from the L4-5 level. IMPRESSION: 1. Stable appearance of post therapy related findings in the paramediastinal regions most confluent in the left infrahilar region. No progression or thoracic adenopathy identified. 2. Stable mildly enlarged porta hepatis/portacaval lymph nodes. These were smaller at the time of the prior 01/01/2020 PET-CT but were not hypermetabolic on the prior PET-CT. I am uncertain if these are chronic reactive lymph nodes or if the mild enlargement is a reflection of metastatic disease. These could be surveilled or further assessed with nuclear medicine PET-CT if clinically warranted. 3. Other imaging findings of potential clinical significance: Aortic Atherosclerosis (ICD10-I70.0). Coronary atherosclerosis. Prominent stool throughout the colon favors constipation. Mild sigmoid colon diverticulosis. Multilevel lumbar impingement. Electronically Signed   By: Van Clines M.D.   On: 01/20/2021 11:15   CT Abdomen Pelvis W Contrast  Result Date: 01/20/2021 CLINICAL DATA:  Metastatic non-small cell lung cancer diagnosed August 2021. Hemoptysis over the last 2 weeks. Prior right craniotomy with tumor resection. EXAM: CT CHEST, ABDOMEN, AND PELVIS WITH CONTRAST TECHNIQUE: Multidetector CT imaging of the chest, abdomen and pelvis was performed following the standard protocol during bolus  administration of intravenous contrast. CONTRAST:  89mL OMNIPAQUE IOHEXOL 350 MG/ML SOLN COMPARISON:  None. Multiple exams, including 11/22/2020 FINDINGS: CT CHEST FINDINGS Cardiovascular: Right Port-A-Cath tip: Right atrium. Atherosclerosis noted including the circumflex, right, and left anterior descending  coronary arteries. Mediastinum/Nodes: AP window lymph node 0.7 cm in short axis on image 24 series 2, previously the same. Subcarinal lymph node mildly obscured by indistinct surrounding tissue planes but about 0.7 cm in short axis on image 29 series 2, previously 0.8 cm. Left infrahilar densities similar to prior measuring 0.7 cm in short axis on image 32 series 2, formerly the same. Anterior left infrahilar density 0.8 cm in short axis on image 33 series 2, formerly 0.9 cm. Lungs/Pleura: Centrilobular emphysema. 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 Mild lingular scarring. Musculoskeletal: Small sclerotic lesion in the T3 vertebral body measuring 0.6 cm in diameter on image 18 series 8, unchanged from earliest available comparison of 12/14/2019 and not appreciably hypermetabolic on intervening PET-CT, likely benign. CT ABDOMEN PELVIS FINDINGS Hepatobiliary: Unremarkable Pancreas: Unremarkable Spleen: Unremarkable Adrenals/Urinary Tract: Unremarkable Stomach/Bowel: Prominent stool throughout the colon favors constipation. Mild sigmoid colon diverticulosis. Vascular/Lymphatic: Atherosclerosis is present, including aortoiliac atherosclerotic disease. Index portacaval node 1.8 cm in short axis on image 63 series 2, previously same by my measurements. Index peripancreatic/porta hepatis lymph node 1.5 cm in short axis on image 60 series 2, previously 1.6 cm my measurements. Separate peripancreatic node 1.3 cm in short axis on image 57 series 2, formerly same. Small periaortic lymph nodes are not pathologically enlarged. Reproductive: Unremarkable Other: No supplemental non-categorized findings. Musculoskeletal: Lumbar spondylosis and degenerative disc disease contributing to impingement at L2-3, L3-4, and L4-5. There is a notable disc protrusion extending caudad from the L4-5 level. IMPRESSION: 1. Stable appearance of  post therapy related findings in the paramediastinal regions most confluent in the left infrahilar region. No progression or thoracic adenopathy identified. 2. Stable mildly enlarged porta hepatis/portacaval lymph nodes. These were smaller at the time of the prior 01/01/2020 PET-CT but were not hypermetabolic on the prior PET-CT. I am uncertain if these are chronic reactive lymph nodes or if the mild enlargement is a reflection of metastatic disease. These could be surveilled or further assessed with nuclear medicine PET-CT if clinically warranted. 3. Other imaging findings of potential clinical significance: Aortic Atherosclerosis (ICD10-I70.0). Coronary atherosclerosis. Prominent stool throughout the colon favors constipation. Mild sigmoid colon diverticulosis. Multilevel lumbar impingement. Electronically Signed   By: Van Clines M.D.   On: 01/20/2021 11:15     ASSESSMENT/PLAN:  No problem-specific Assessment & Plan notes found for this encounter. Oct 17th,   No orders of the defined types were placed in this encounter.    I spent {CHL ONC TIME VISIT - [id number]} counseling the patient face to face. The total time spent in the appointment was {CHL ONC TIME VISIT - [id number]}.  Sheniya Garciaperez L Beverlyn Mcginness, PA-C 02/06/21

## 2021-02-07 NOTE — Progress Notes (Signed)
[facility] OFFICE PROGRESS NOTE  [name], MD [place] [address]  DIAGNOSIS: Stage IV (TX, N2, M1 C) non-small cell lung cancer, adenocarcinoma diagnosed in August 2021 and presented with solitary brain metastasis in addition to mediastinal lymphadenopathy.   PDL1 Expression 70%  Molecular Biomarkers:  Tumor Mutational Burden - 52 Muts/Mb Microsatellite status - MS-Stable Genomic Findings For a complete list of the genes assayed, please refer to the Appendix. NF1 E1694* MTAP loss exons 2-8 RICTOR amplification ATRX A419V BRAF K483E CDKN2A/B CDKN2A loss, CDKN2B loss DNMT3A E205* FGF10 amplification NTRK1 amplification - equivocal? 7 Disease relevant genes with no reportable alterations: ALK, EGFR, ERBB2, KRAS, MET, RET, ROS1   PRIOR THERAPY: 1) Status post right craniotomy with tumor resection followed by [facility] to solitary brain metastasis under the care of Dr. Lisbeth Renshaw and Dr. Sherral Hammers. 2) Concurrent chemoradiation with weekly carboplatin for AUC of 2 and paclitaxel 45 mg/M2.  First dose 02/01/2020. Status post 5 cycles.  Last dose was given February 29, 2020.  CURRENT THERAPY:  First-line treatment with immunotherapy with Libtayo (Cempilimab) 350 mg IV every 3 weeks.  First dose April 25, 2020.  Status post 13 cycles.  INTERVAL HISTORY: Jared Shea. 65 y.o. male returns to the clinic today for a follow-up visit.  At the patient's last appointment he was reporting increased frequency of dizzy spells.  Therefore, he had a CT scan of the head performed which did not show any metastatic disease to the brain.  Additionally, the patient has been reporting significant arthralgias, particularly in his knees since starting immunotherapy.  Dr. Julien Nordmann discussed with him at his last appointment that he could try taking a break from treatment versus continuing.  He currently takes Norco for pain control. He would like to proceed with treatment as  scheduled for now.   In the interval, he tested positive for COVID-19 on 02/04/21. He was asymptomatic and tested himself due to a known exposure.   He denies any fever, chills, or weight loss. He denies any changes with his breathing. He has some mild dyspnea when he is "pushing" himself. Denies significant cough.  He denies any hemoptysis or chest pain. He reports his baseline dyspnea on exertion.  He denies any nausea, vomiting, diarrhea, or constipation.  He denies any rashes or skin changes.  He is here today for evaluation before proceeding with cycle #14 today's schedule.  MEDICAL HISTORY: Past Medical History:  Diagnosis Date   Brain mass    Bursitis of right hip    Chest pain 08/12/2018   Chronic cough    Dependence on nicotine from cigarettes    Diabetes mellitus without complication ([place])    Essential hypertension 07/28/2018   nscl ca dx'd 12/2019   Seizures ([place])    Viral illness 03/23/2019    ALLERGIES:  is allergic to penicillins.  MEDICATIONS:  Current Outpatient Medications  Medication Sig Dispense Refill   HYDROcodone-acetaminophen (NORCO) 5-325 MG tablet Take 1 tablet by mouth every 6 (six) hours as needed for moderate pain or severe pain. 20 tablet 0   ibuprofen (ADVIL) 200 MG tablet Take 400 mg by mouth every 6 (six) hours as needed for mild pain.     levETIRAcetam (KEPPRA) 750 MG tablet Take 1 tablet (750 mg total) by mouth 2 (two) times daily. 180 tablet 4   losartan (COZAAR) 25 MG tablet Take 1 tablet (25 mg total) by mouth daily. 30 tablet 11   Melatonin 10 MG TABS  Take 10 mg by mouth at bedtime as needed (sleep).     nortriptyline (PAMELOR) 25 MG capsule Take 2 capsules (50 mg total) by mouth at bedtime. 60 capsule 11   pseudoephedrine-acetaminophen (TYLENOL SINUS) 30-500 MG TABS tablet Take 1 tablet by mouth every 4 (four) hours as needed (sinus pressure).     SUMAtriptan (IMITREX) 25 MG tablet Take 1 tablet (25 mg total) by mouth every 2 (two) hours as needed  for migraine. May repeat in 2 hours if headache persists or recurs. (Patient not taking: No sig reported) 10 tablet 6   No current facility-administered medications for this visit.   Facility-Administered Medications Ordered in Other Visits  Medication Dose Route Frequency Provider Last Rate Last Admin   cemiplimab-rwlc (LIBTAYO) 350 mg in sodium chloride 0.9 % 100 mL chemo infusion  350 mg Intravenous Once Curt Bears, MD       heparin lock flush 100 unit/mL  500 Units Intracatheter Once PRN Curt Bears, MD       sodium chloride flush (NS) 0.9 % injection 10 mL  10 mL Intracatheter PRN Curt Bears, MD        SURGICAL HISTORY:  Past Surgical History:  Procedure Laterality Date   APPLICATION OF CRANIAL NAVIGATION N/A 01/07/2020   Procedure: APPLICATION OF CRANIAL NAVIGATION;  Surgeon: Ashok Pall, MD;  Location: [facility];  Service: Neurosurgery;  Laterality: N/A;   CRANIOTOMY Right 01/07/2020   Procedure: RIGHT CRANIOTOMY FOR TUMOR RESECTION;  Surgeon: Ashok Pall, MD;  Location: [facility];  Service: Neurosurgery;  Laterality: Right;  rm 21   ENDOBRONCHIAL ULTRASOUND N/A 12/21/2019   Procedure: ENDOBRONCHIAL ULTRASOUND;  Surgeon: Laurin Coder, MD;  Location: [facility];  Service: Pulmonary;  Laterality: N/A;   FINE NEEDLE ASPIRATION  12/21/2019   Procedure: FINE NEEDLE ASPIRATION (FNA) LINEAR;  Surgeon: Laurin Coder, MD;  Location: [facility];  Service: Pulmonary;;   IR IMAGING GUIDED PORT INSERTION  03/03/2020   NO PAST SURGERIES     VIDEO BRONCHOSCOPY N/A 12/21/2019   Procedure: VIDEO BRONCHOSCOPY WITHOUT FLUORO;  Surgeon: Laurin Coder, MD;  Location: [facility];  Service: Pulmonary;  Laterality: N/A;    REVIEW OF SYSTEMS:   Review of Systems  Constitutional: Positive for fatigue. Negative for appetite change, chills, fever and unexpected weight change.  HENT: Negative for mouth sores, nosebleeds, sore throat and trouble swallowing.   Eyes: Negative for  eye problems and icterus.  Respiratory: Negative for cough, hemoptysis, shortness of breath and wheezing.   Cardiovascular: Negative for chest pain and leg swelling.  Gastrointestinal: Negative for abdominal pain, constipation, diarrhea, nausea and vomiting.  Genitourinary: Negative for bladder incontinence, difficulty urinating, dysuria, frequency and hematuria.   Musculoskeletal: Positive for polyarthralgia. Negative for back pain, gait problem, neck pain and neck stiffness.  Skin: Negative for itching and rash.  Neurological: Negative for dizziness, extremity weakness, gait problem, headaches, light-headedness and seizures.  Hematological: Negative for adenopathy. Does not bruise/bleed easily.  Psychiatric/Behavioral: Negative for confusion, depression and sleep disturbance. The patient is not nervous/anxious.     PHYSICAL EXAMINATION:  There were no vitals taken for this visit.  ECOG PERFORMANCE STATUS: 1  Physical Exam  Constitutional: Oriented to person, place, and time and well-developed, well-nourished, and in no distress.  HENT:  Head: Normocephalic and atraumatic.  Mouth/Throat: Oropharynx is clear and moist. No oropharyngeal exudate.  Eyes: Conjunctivae are normal. Right eye exhibits no discharge. Left eye exhibits no discharge. No scleral icterus.  Neck: Normal range of  motion. Neck supple.  Cardiovascular: Normal rate, regular rhythm, normal heart sounds and intact distal pulses.   Pulmonary/Chest: Effort normal and breath sounds normal. No respiratory distress. No wheezes. No rales.  Abdominal: Soft. Bowel sounds are normal. Exhibits no distension and no mass. There is no tenderness.  Musculoskeletal: Normal range of motion. Exhibits no edema.  Lymphadenopathy:    No cervical adenopathy.  Neurological: Alert and oriented to person, place, and time. Exhibits normal muscle tone. Gait normal. Coordination normal.  Skin: Skin is warm and dry. No rash noted. Not diaphoretic.  No erythema. No pallor.  Psychiatric: Mood, memory and judgment normal.  Vitals reviewed.  LABORATORY DATA: Lab Results  Component Value Date   WBC 7.1 02/15/2021   HGB 12.0 (L) 02/15/2021   HCT 35.1 (L) 02/15/2021   MCV 89.8 02/15/2021   PLT 342 02/15/2021      Chemistry      Component Value Date/Time   NA 143 02/15/2021 1342   NA 140 11/23/2019 1516   K 3.8 02/15/2021 1342   CL 109 02/15/2021 1342   CO2 26 02/15/2021 1342   BUN 12 02/15/2021 1342   BUN 14 11/23/2019 1516   CREATININE 0.83 02/15/2021 1342      Component Value Date/Time   CALCIUM 9.4 02/15/2021 1342   ALKPHOS 79 02/15/2021 1342   AST 27 02/15/2021 1342   ALT 43 02/15/2021 1342   BILITOT 0.9 02/15/2021 1342       RADIOGRAPHIC STUDIES:  CT HEAD W & WO CONTRAST (5MM)  Result Date: 02/01/2021 CLINICAL DATA:  Small-cell lung cancer, history of right parietal craniotomy for tumor resection. EXAM: CT HEAD WITHOUT AND WITH CONTRAST TECHNIQUE: Contiguous axial images were obtained from the base of the skull through the vertex without and with intravenous contrast CONTRAST:  51m OMNIPAQUE IOHEXOL 350 MG/ML SOLN COMPARISON:  Brain MRI 10/29/2020 FINDINGS: Brain: Postsurgical changes reflecting right parietal craniotomy for mass resection are again seen. A small focus of encephalomalacia/gliosis in the underlying brain parenchyma is similar to the prior study. There is no definite enhancement to suggest residual or recurrent tumor. There is no other abnormal enhancement in the brain. There is no evidence of acute intracranial hemorrhage, extra-axial fluid collection, or infarct. The ventricles are stable in size. There is no midline shift. Vascular: There is calcification of the bilateral cavernous ICAs. Skull: Postsurgical changes reflecting right parietal craniotomy are again noted. The calvarium is otherwise unremarkable. Sinuses/Orbits: The imaged paranasal sinuses are clear. The globes and orbits are unremarkable.  Other: None. IMPRESSION: Stable postsurgical changes reflecting right parietal craniotomy for mass resection with no definite evidence of residual or recurrent disease, and no other area of abnormal enhancement. Note that MRI is more sensitive for detection of small intracranial metastases; consider MRI with and without contrast for subsequent staging exams. Electronically Signed   By: PValetta MoleM.D.   On: 02/01/2021 12:02   CT Chest W Contrast  Result Date: 01/20/2021 CLINICAL DATA:  Metastatic non-small cell lung cancer diagnosed August 2021. Hemoptysis over the last 2 weeks. Prior right craniotomy with tumor resection. EXAM: CT CHEST, ABDOMEN, AND PELVIS WITH CONTRAST TECHNIQUE: Multidetector CT imaging of the chest, abdomen and pelvis was performed following the standard protocol during bolus administration of intravenous contrast. CONTRAST:  880mOMNIPAQUE IOHEXOL 350 MG/ML SOLN COMPARISON:  None. Multiple exams, including 11/22/2020 FINDINGS: CT CHEST FINDINGS Cardiovascular: Right Port-A-Cath tip: Right atrium. Atherosclerosis noted including the circumflex, right, and left anterior descending coronary arteries. Mediastinum/Nodes: AP  window lymph node 0.7 cm in short axis on image 24 series 2, previously the same. Subcarinal lymph node mildly obscured by indistinct surrounding tissue planes but about 0.7 cm in short axis on image 29 series 2, previously 0.8 cm. Left infrahilar densities similar to prior measuring 0.7 cm in short axis on image 32 series 2, formerly the same. Anterior left infrahilar density 0.8 cm in short axis on image 33 series 2, formerly 0.9 cm. Lungs/Pleura: Centrilobular emphysema. 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 Mild lingular scarring. Musculoskeletal: Small sclerotic lesion in the T3 vertebral body measuring 0.6 cm in diameter on image 18 series 8, unchanged from earliest  available comparison of 12/14/2019 and not appreciably hypermetabolic on intervening PET-CT, likely benign. CT ABDOMEN PELVIS FINDINGS Hepatobiliary: Unremarkable Pancreas: Unremarkable Spleen: Unremarkable Adrenals/Urinary Tract: Unremarkable Stomach/Bowel: Prominent stool throughout the colon favors constipation. Mild sigmoid colon diverticulosis. Vascular/Lymphatic: Atherosclerosis is present, including aortoiliac atherosclerotic disease. Index portacaval node 1.8 cm in short axis on image 63 series 2, previously same by my measurements. Index peripancreatic/porta hepatis lymph node 1.5 cm in short axis on image 60 series 2, previously 1.6 cm my measurements. Separate peripancreatic node 1.3 cm in short axis on image 57 series 2, formerly same. Small periaortic lymph nodes are not pathologically enlarged. Reproductive: Unremarkable Other: No supplemental non-categorized findings. Musculoskeletal: Lumbar spondylosis and degenerative disc disease contributing to impingement at L2-3, L3-4, and L4-5. There is a notable disc protrusion extending caudad from the L4-5 level. IMPRESSION: 1. Stable appearance of post therapy related findings in the paramediastinal regions most confluent in the left infrahilar region. No progression or thoracic adenopathy identified. 2. Stable mildly enlarged porta hepatis/portacaval lymph nodes. These were smaller at the time of the prior 01/01/2020 PET-CT but were not hypermetabolic on the prior PET-CT. I am uncertain if these are chronic reactive lymph nodes or if the mild enlargement is a reflection of metastatic disease. These could be surveilled or further assessed with nuclear medicine PET-CT if clinically warranted. 3. Other imaging findings of potential clinical significance: Aortic Atherosclerosis (ICD10-I70.0). Coronary atherosclerosis. Prominent stool throughout the colon favors constipation. Mild sigmoid colon diverticulosis. Multilevel lumbar impingement. Electronically  Signed   By: Van Clines M.D.   On: 01/20/2021 11:15   CT Abdomen Pelvis W Contrast  Result Date: 01/20/2021 CLINICAL DATA:  Metastatic non-small cell lung cancer diagnosed August 2021. Hemoptysis over the last 2 weeks. Prior right craniotomy with tumor resection. EXAM: CT CHEST, ABDOMEN, AND PELVIS WITH CONTRAST TECHNIQUE: Multidetector CT imaging of the chest, abdomen and pelvis was performed following the standard protocol during bolus administration of intravenous contrast. CONTRAST:  2m OMNIPAQUE IOHEXOL 350 MG/ML SOLN COMPARISON:  None. Multiple exams, including 11/22/2020 FINDINGS: CT CHEST FINDINGS Cardiovascular: Right Port-A-Cath tip: Right atrium. Atherosclerosis noted including the circumflex, right, and left anterior descending coronary arteries. Mediastinum/Nodes: AP window lymph node 0.7 cm in short axis on image 24 series 2, previously the same. Subcarinal lymph node mildly obscured by indistinct surrounding tissue planes but about 0.7 cm in short axis on image 29 series 2, previously 0.8 cm. Left infrahilar densities similar to prior measuring 0.7 cm in short axis on image 32 series 2, formerly the same. Anterior left infrahilar density 0.8 cm in short axis on image 33 series 2, formerly 0.9 cm. Lungs/Pleura: Centrilobular emphysema. 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 Mild lingular scarring. Musculoskeletal:  Small sclerotic lesion in the T3 vertebral body measuring 0.6 cm in diameter on image 18 series 8, unchanged from earliest available comparison of 12/14/2019 and not appreciably hypermetabolic on intervening PET-CT, likely benign. CT ABDOMEN PELVIS FINDINGS Hepatobiliary: Unremarkable Pancreas: Unremarkable Spleen: Unremarkable Adrenals/Urinary Tract: Unremarkable Stomach/Bowel: Prominent stool throughout the colon favors constipation. Mild sigmoid colon diverticulosis.  Vascular/Lymphatic: Atherosclerosis is present, including aortoiliac atherosclerotic disease. Index portacaval node 1.8 cm in short axis on image 63 series 2, previously same by my measurements. Index peripancreatic/porta hepatis lymph node 1.5 cm in short axis on image 60 series 2, previously 1.6 cm my measurements. Separate peripancreatic node 1.3 cm in short axis on image 57 series 2, formerly same. Small periaortic lymph nodes are not pathologically enlarged. Reproductive: Unremarkable Other: No supplemental non-categorized findings. Musculoskeletal: Lumbar spondylosis and degenerative disc disease contributing to impingement at L2-3, L3-4, and L4-5. There is a notable disc protrusion extending caudad from the L4-5 level. IMPRESSION: 1. Stable appearance of post therapy related findings in the paramediastinal regions most confluent in the left infrahilar region. No progression or thoracic adenopathy identified. 2. Stable mildly enlarged porta hepatis/portacaval lymph nodes. These were smaller at the time of the prior 01/01/2020 PET-CT but were not hypermetabolic on the prior PET-CT. I am uncertain if these are chronic reactive lymph nodes or if the mild enlargement is a reflection of metastatic disease. These could be surveilled or further assessed with nuclear medicine PET-CT if clinically warranted. 3. Other imaging findings of potential clinical significance: Aortic Atherosclerosis (ICD10-I70.0). Coronary atherosclerosis. Prominent stool throughout the colon favors constipation. Mild sigmoid colon diverticulosis. Multilevel lumbar impingement. Electronically Signed   By: Van Clines M.D.   On: 01/20/2021 11:15     ASSESSMENT/PLAN:  This is a very pleasant 65 year old African-American male diagnosed with stage IV (Tx, N2, M1c) non-small cell lung cancer, adenocarcinoma.  He presented with a solitary brain metastasis in addition to right hilar and mediastinal lymphadenopathy.  He was diagnosed in  August 2021.    The patient is status post right craniotomy with resection of the solitary brain metastasis with SRS under the care of Dr. Lisbeth Renshaw and Dr. Christella Noa.    He completed weekly concurrent chemoradiation with carboplatin for an AUC of 2 and paclitaxel 45 mg per metered square.  He is status post 5 cycles.  He tolerated it well except for some dysphagia/odynophagia.   The patient then had evidence of new brain metastases.  He is status post SRS to the new lesions on 04/22/2020 under the care of Dr. Lisbeth Renshaw.   The patient is currently undergoing immunotherapy with Libtayo IV every 3 weeks.  He is status post 13 cycles and tolerated it well except for arthralgias.   The patient recently had a restaging CT scan of the head to evaluate his headaches and dizziness.  The CT scan did not show any evidence of metastatic disease to the brain.    Labs were reviewed. He will proceed with cycle #14 today as scheduled. He will let us know if he decides to take a break from treatment.   We will see him back for follow-up visit in 3 weeks for evaluation before starting cycle #14.  He will continue to take norco for pain.   The patient was advised to call immediately if he has any concerning symptoms in the interval. The patient voices understanding of current disease status and treatment options and is in agreement with the current care plan. All questions were answered. The patient  knows to call the clinic with any problems, questions or concerns. We can certainly see the patient much sooner if necessary         No orders of the defined types were placed in this encounter.    The total time spent in the appointment was 20-29 minutes  Jared Middlebrooks L Brithany Whitworth, PA-C 02/15/21

## 2021-02-13 ENCOUNTER — Ambulatory Visit: Payer: Medicare HMO

## 2021-02-13 ENCOUNTER — Other Ambulatory Visit: Payer: Medicare HMO

## 2021-02-13 ENCOUNTER — Ambulatory Visit: Payer: Medicare HMO | Admitting: Physician Assistant

## 2021-02-15 ENCOUNTER — Inpatient Hospital Stay: Payer: Medicare HMO | Attending: Internal Medicine | Admitting: Physician Assistant

## 2021-02-15 ENCOUNTER — Other Ambulatory Visit: Payer: Self-pay

## 2021-02-15 ENCOUNTER — Inpatient Hospital Stay: Payer: Medicare HMO

## 2021-02-15 VITALS — BP 151/87 | HR 86 | Temp 98.0°F | Resp 18 | Wt 198.5 lb

## 2021-02-15 DIAGNOSIS — J432 Centrilobular emphysema: Secondary | ICD-10-CM | POA: Diagnosis not present

## 2021-02-15 DIAGNOSIS — C3490 Malignant neoplasm of unspecified part of unspecified bronchus or lung: Secondary | ICD-10-CM | POA: Diagnosis not present

## 2021-02-15 DIAGNOSIS — Z87891 Personal history of nicotine dependence: Secondary | ICD-10-CM | POA: Diagnosis not present

## 2021-02-15 DIAGNOSIS — Z8616 Personal history of COVID-19: Secondary | ICD-10-CM | POA: Diagnosis not present

## 2021-02-15 DIAGNOSIS — Z5112 Encounter for antineoplastic immunotherapy: Secondary | ICD-10-CM | POA: Insufficient documentation

## 2021-02-15 DIAGNOSIS — I10 Essential (primary) hypertension: Secondary | ICD-10-CM | POA: Insufficient documentation

## 2021-02-15 DIAGNOSIS — C7931 Secondary malignant neoplasm of brain: Secondary | ICD-10-CM | POA: Insufficient documentation

## 2021-02-15 DIAGNOSIS — Z86010 Personal history of colonic polyps: Secondary | ICD-10-CM | POA: Diagnosis not present

## 2021-02-15 DIAGNOSIS — C3491 Malignant neoplasm of unspecified part of right bronchus or lung: Secondary | ICD-10-CM

## 2021-02-15 DIAGNOSIS — Z79899 Other long term (current) drug therapy: Secondary | ICD-10-CM | POA: Diagnosis not present

## 2021-02-15 DIAGNOSIS — E119 Type 2 diabetes mellitus without complications: Secondary | ICD-10-CM | POA: Diagnosis not present

## 2021-02-15 DIAGNOSIS — I700 Atherosclerosis of aorta: Secondary | ICD-10-CM | POA: Diagnosis not present

## 2021-02-15 DIAGNOSIS — C799 Secondary malignant neoplasm of unspecified site: Secondary | ICD-10-CM

## 2021-02-15 DIAGNOSIS — I2510 Atherosclerotic heart disease of native coronary artery without angina pectoris: Secondary | ICD-10-CM | POA: Diagnosis not present

## 2021-02-15 LAB — CMP (CANCER CENTER ONLY)
ALT: 43 U/L (ref 0–44)
AST: 27 U/L (ref 15–41)
Albumin: 4.2 g/dL (ref 3.5–5.0)
Alkaline Phosphatase: 79 U/L (ref 38–126)
Anion gap: 8 (ref 5–15)
BUN: 12 mg/dL (ref 8–23)
CO2: 26 mmol/L (ref 22–32)
Calcium: 9.4 mg/dL (ref 8.9–10.3)
Chloride: 109 mmol/L (ref 98–111)
Creatinine: 0.83 mg/dL (ref 0.61–1.24)
GFR, Estimated: >60 mL/min (ref >60)
Glucose, Bld: 127 mg/dL — ABNORMAL HIGH (ref 70–99)
Potassium: 3.8 mmol/L (ref 3.5–5.1)
Sodium: 143 mmol/L (ref 135–145)
Total Bilirubin: 0.9 mg/dL (ref 0.3–1.2)
Total Protein: 7.8 g/dL (ref 6.5–8.1)

## 2021-02-15 LAB — CBC WITH DIFFERENTIAL (CANCER CENTER ONLY)
Abs Immature Granulocytes: 0.02 10*3/uL (ref 0.00–0.07)
Basophils Absolute: 0 10*3/uL (ref 0.0–0.1)
Basophils Relative: 0 %
Eosinophils Absolute: 0.1 10*3/uL (ref 0.0–0.5)
Eosinophils Relative: 2 %
HCT: 35.1 % — ABNORMAL LOW (ref 39.0–52.0)
Hemoglobin: 12 g/dL — ABNORMAL LOW (ref 13.0–17.0)
Immature Granulocytes: 0 %
Lymphocytes Relative: 25 %
Lymphs Abs: 1.8 10*3/uL (ref 0.7–4.0)
MCH: 30.7 pg (ref 26.0–34.0)
MCHC: 34.2 g/dL (ref 30.0–36.0)
MCV: 89.8 fL (ref 80.0–100.0)
Monocytes Absolute: 0.6 10*3/uL (ref 0.1–1.0)
Monocytes Relative: 8 %
Neutro Abs: 4.6 10*3/uL (ref 1.7–7.7)
Neutrophils Relative %: 65 %
Platelet Count: 342 10*3/uL (ref 150–400)
RBC: 3.91 MIL/uL — ABNORMAL LOW (ref 4.22–5.81)
RDW: 14 % (ref 11.5–15.5)
WBC Count: 7.1 10*3/uL (ref 4.0–10.5)
nRBC: 0 % (ref 0.0–0.2)

## 2021-02-15 LAB — TSH: TSH: 0.443 u[IU]/mL (ref 0.320–4.118)

## 2021-02-15 MED ORDER — SODIUM CHLORIDE 0.9 % IV SOLN: Freq: Once | INTRAVENOUS | Status: AC

## 2021-02-15 MED ORDER — HEPARIN SOD (PORK) LOCK FLUSH 100 UNIT/ML IV SOLN
500.0000 [IU] | Freq: Once | INTRAVENOUS | Status: AC | PRN
  Administered 2021-02-15: 500 [IU]

## 2021-02-15 MED ORDER — SODIUM CHLORIDE 0.9 % IV SOLN
350.0000 mg | Freq: Once | INTRAVENOUS | Status: AC
  Administered 2021-02-15: 350 mg via INTRAVENOUS
  Filled 2021-02-15: qty 7

## 2021-02-15 MED ORDER — SODIUM CHLORIDE 0.9% FLUSH
10.0000 mL | INTRAVENOUS | Status: DC | PRN
  Administered 2021-02-15: 10 mL

## 2021-02-15 NOTE — Progress Notes (Signed)
Cassie, NP aware pt tested positive for covid 10 days ago.  Ok to proceed with possibility of treatment.

## 2021-02-15 NOTE — Patient Instructions (Signed)
[facility] ONCOLOGY  Discharge Instructions: Thank you for choosing [facility] to provide your oncology and hematology care.   If you have a lab appointment with the [facility], please go directly to the [facility] and check in at the registration area.   Wear comfortable clothing and clothing appropriate for easy access to any Portacath or PICC line.   We strive to give you quality time with your provider. You may need to reschedule your appointment if you arrive late (15 or more minutes).  Arriving late affects you and other patients whose appointments are after yours.  Also, if you miss three or more appointments without notifying the office, you may be dismissed from the clinic at the provider's discretion.      For prescription refill requests, have your pharmacy contact our office and allow 72 hours for refills to be completed.    Today you received the following chemotherapy and/or immunotherapy agents libtayo      To help prevent nausea and vomiting after your treatment, we encourage you to take your nausea medication as directed.  BELOW ARE SYMPTOMS THAT SHOULD BE REPORTED IMMEDIATELY: *FEVER GREATER THAN 100.4 F (38 C) OR HIGHER *CHILLS OR SWEATING *NAUSEA AND VOMITING THAT IS NOT CONTROLLED WITH YOUR NAUSEA MEDICATION *UNUSUAL SHORTNESS OF BREATH *UNUSUAL BRUISING OR BLEEDING *URINARY PROBLEMS (pain or burning when urinating, or frequent urination) *BOWEL PROBLEMS (unusual diarrhea, constipation, pain near the anus) TENDERNESS IN MOUTH AND THROAT WITH OR WITHOUT PRESENCE OF ULCERS (sore throat, sores in mouth, or a toothache) UNUSUAL RASH, SWELLING OR PAIN  UNUSUAL VAGINAL DISCHARGE OR ITCHING   Items with * indicate a potential emergency and should be followed up as soon as possible or go to the Emergency Department if any problems should occur.  Please show the CHEMOTHERAPY ALERT CARD or IMMUNOTHERAPY ALERT CARD at check-in to the  Emergency Department and triage nurse.  Should you have questions after your visit or need to cancel or reschedule your appointment, please contact [facility]  Dept: [phone]  and follow the prompts.  Office hours are 8:00 a.m. to 4:30 p.m. Monday - Friday. Please note that voicemails left after 4:00 p.m. may not be returned until the following business day.  We are closed weekends and major holidays. You have access to a nurse at all times for urgent questions. Please call the main number to the clinic Dept: [phone] and follow the prompts.   For any non-urgent questions, you may also contact your provider using MyChart. We now offer e-Visits for anyone 27 and older to request care online for non-urgent symptoms. For details visit [email or web address].   Also download the MyChart app! Go to the app store, search "MyChart", open the app, select [facility], and log in with your MyChart username and password.  Due to Covid, a mask is required upon entering the hospital/clinic. If you do not have a mask, one will be given to you upon arrival. For doctor visits, patients may have 1 support person aged 82 or older with them. For treatment visits, patients cannot have anyone with them due to current Covid guidelines and our immunocompromised population.

## 2021-02-17 ENCOUNTER — Telehealth: Payer: Self-pay | Admitting: Internal Medicine

## 2021-02-17 NOTE — Telephone Encounter (Signed)
Sch per los, left msg with sister per pt req

## 2021-02-23 ENCOUNTER — Telehealth: Payer: Self-pay | Admitting: *Deleted

## 2021-02-23 NOTE — Telephone Encounter (Signed)
Attempted to reach patients sister to let her know he has upcoming appt with Dr Mickeal Skinner and he still needs to have his MRI completed prior to his appointment with Dr Mickeal Skinner.  Left message pending call back.

## 2021-02-27 ENCOUNTER — Telehealth: Payer: Self-pay | Admitting: *Deleted

## 2021-02-27 ENCOUNTER — Inpatient Hospital Stay: Payer: Medicare HMO | Admitting: Internal Medicine

## 2021-02-27 NOTE — Telephone Encounter (Signed)
Spoke with patient.  He states he had CT last month.  Per Dr Mickeal Skinner patient needs to have MRI since he had small mets in the past and CT would not detect those.  Spoke with patient; he is aware he needs to scheduled MRI.  Canceled todays visit, will reschedule once MRI is completed.

## 2021-03-04 ENCOUNTER — Other Ambulatory Visit: Payer: Self-pay

## 2021-03-04 ENCOUNTER — Ambulatory Visit (HOSPITAL_COMMUNITY)
Admission: RE | Admit: 2021-03-04 | Discharge: 2021-03-04 | Disposition: A | Discharge status: Home / Self Care | Payer: Medicare HMO | Source: Ambulatory Visit | Attending: Internal Medicine | Admitting: Internal Medicine

## 2021-03-04 DIAGNOSIS — C7931 Secondary malignant neoplasm of brain: Secondary | ICD-10-CM | POA: Diagnosis present

## 2021-03-04 MED ORDER — GADOBUTROL 1 MMOL/ML IV SOLN
9.0000 mL | Freq: Once | INTRAVENOUS | Status: AC | PRN
  Administered 2021-03-04: 9 mL via INTRAVENOUS

## 2021-03-04 MED ORDER — HEPARIN SOD (PORK) LOCK FLUSH 100 UNIT/ML IV SOLN
500.0000 [IU] | INTRAVENOUS | Status: AC | PRN
  Administered 2021-03-04: 500 [IU]
  Filled 2021-03-04: qty 5

## 2021-03-06 ENCOUNTER — Other Ambulatory Visit: Payer: Self-pay | Admitting: *Deleted

## 2021-03-06 ENCOUNTER — Other Ambulatory Visit: Payer: Self-pay | Admitting: Internal Medicine

## 2021-03-06 ENCOUNTER — Encounter: Payer: Self-pay | Admitting: Internal Medicine

## 2021-03-06 ENCOUNTER — Inpatient Hospital Stay: Payer: Medicare HMO

## 2021-03-06 ENCOUNTER — Other Ambulatory Visit: Payer: Self-pay

## 2021-03-06 ENCOUNTER — Inpatient Hospital Stay (HOSPITAL_BASED_OUTPATIENT_CLINIC_OR_DEPARTMENT_OTHER): Payer: Medicare HMO | Admitting: Internal Medicine

## 2021-03-06 VITALS — BP 160/89 | HR 82 | Temp 97.0°F | Resp 19 | Ht 73.0 in | Wt 200.8 lb

## 2021-03-06 DIAGNOSIS — C3490 Malignant neoplasm of unspecified part of unspecified bronchus or lung: Secondary | ICD-10-CM

## 2021-03-06 DIAGNOSIS — C7931 Secondary malignant neoplasm of brain: Secondary | ICD-10-CM | POA: Diagnosis not present

## 2021-03-06 DIAGNOSIS — C799 Secondary malignant neoplasm of unspecified site: Secondary | ICD-10-CM

## 2021-03-06 DIAGNOSIS — Z95828 Presence of other vascular implants and grafts: Secondary | ICD-10-CM

## 2021-03-06 DIAGNOSIS — C3491 Malignant neoplasm of unspecified part of right bronchus or lung: Secondary | ICD-10-CM

## 2021-03-06 DIAGNOSIS — Z5112 Encounter for antineoplastic immunotherapy: Secondary | ICD-10-CM | POA: Diagnosis not present

## 2021-03-06 LAB — CMP (CANCER CENTER ONLY)
ALT: 30 U/L (ref 0–44)
AST: 25 U/L (ref 15–41)
Albumin: 4.6 g/dL (ref 3.5–5.0)
Alkaline Phosphatase: 62 U/L (ref 38–126)
Anion gap: 6 (ref 5–15)
BUN: 16 mg/dL (ref 8–23)
CO2: 26 mmol/L (ref 22–32)
Calcium: 8.9 mg/dL (ref 8.9–10.3)
Chloride: 107 mmol/L (ref 98–111)
Creatinine: 0.71 mg/dL (ref 0.61–1.24)
GFR, Estimated: >60 mL/min (ref >60)
Glucose, Bld: 94 mg/dL (ref 70–99)
Potassium: 3.7 mmol/L (ref 3.5–5.1)
Sodium: 139 mmol/L (ref 135–145)
Total Bilirubin: 0.5 mg/dL (ref 0.3–1.2)
Total Protein: 8.2 g/dL — ABNORMAL HIGH (ref 6.5–8.1)

## 2021-03-06 LAB — CBC WITH DIFFERENTIAL (CANCER CENTER ONLY)
Abs Immature Granulocytes: 0.04 10*3/uL (ref 0.00–0.07)
Basophils Absolute: 0 10*3/uL (ref 0.0–0.1)
Basophils Relative: 0 %
Eosinophils Absolute: 0.1 10*3/uL (ref 0.0–0.5)
Eosinophils Relative: 2 %
HCT: 35.8 % — ABNORMAL LOW (ref 39.0–52.0)
Hemoglobin: 12.3 g/dL — ABNORMAL LOW (ref 13.0–17.0)
Immature Granulocytes: 1 %
Lymphocytes Relative: 27 %
Lymphs Abs: 1.9 10*3/uL (ref 0.7–4.0)
MCH: 30.1 pg (ref 26.0–34.0)
MCHC: 34.4 g/dL (ref 30.0–36.0)
MCV: 87.5 fL (ref 80.0–100.0)
Monocytes Absolute: 0.5 10*3/uL (ref 0.1–1.0)
Monocytes Relative: 7 %
Neutro Abs: 4.5 10*3/uL (ref 1.7–7.7)
Neutrophils Relative %: 63 %
Platelet Count: 313 10*3/uL (ref 150–400)
RBC: 4.09 MIL/uL — ABNORMAL LOW (ref 4.22–5.81)
RDW: 13.7 % (ref 11.5–15.5)
WBC Count: 7.1 10*3/uL (ref 4.0–10.5)
nRBC: 0 % (ref 0.0–0.2)

## 2021-03-06 LAB — TSH: TSH: 1.34 u[IU]/mL (ref 0.350–4.500)

## 2021-03-06 MED ORDER — SODIUM CHLORIDE 0.9 % IV SOLN
Freq: Once | INTRAVENOUS | Status: AC
Start: 2021-03-06 — End: 2021-03-06

## 2021-03-06 MED ORDER — SODIUM CHLORIDE 0.9% FLUSH
10.0000 mL | Freq: Once | INTRAVENOUS | Status: AC
Start: 2021-03-06 — End: 2021-03-06
  Administered 2021-03-06: 10 mL

## 2021-03-06 MED ORDER — SODIUM CHLORIDE 0.9 % IV SOLN
350.0000 mg | Freq: Once | INTRAVENOUS | Status: AC
  Administered 2021-03-06: 350 mg via INTRAVENOUS
  Filled 2021-03-06: qty 7

## 2021-03-06 MED ORDER — SODIUM CHLORIDE 0.9% FLUSH
10.0000 mL | INTRAVENOUS | Status: DC | PRN
  Administered 2021-03-06: 10 mL

## 2021-03-06 MED ORDER — HEPARIN SOD (PORK) LOCK FLUSH 100 UNIT/ML IV SOLN
500.0000 [IU] | Freq: Once | INTRAVENOUS | Status: AC | PRN
  Administered 2021-03-06: 500 [IU]

## 2021-03-06 NOTE — Patient Instructions (Signed)
[facility]COL[facility]   Discharge Instructions: Thank you for choosing [facility] to provide your oncology and hematology care.   If you have a lab appointment with the [facility], please go directly to the [facility] and check in at the registration area.   Wear comfortable clothing and clothing appropriate for easy access to any Portacath or PICC line.   We strive to give you quality time with your provider. You may need to reschedule your appointment if you arrive late (15 or more minutes).  Arriving late affects you and other patients whose appointments are after yours.  Also, if you miss three or more appointments without notifying the office, you may be dismissed from the clinic at the provider's discretion.      For prescription refill requests, have your pharmacy contact our office and allow 72 hours for refills to be completed.    Today you received the following chemotherapy and/or immunotherapy agents: cemiplimab-rwlc.      To help prevent nausea and vomiting after your treatment, we encourage you to take your nausea medication as directed.  BELOW ARE SYMPTOMS THAT SHOULD BE REPORTED IMMEDIATELY: *FEVER GREATER THAN 100.4 F (38 C) OR HIGHER *CHILLS OR SWEATING *NAUSEA AND VOMITING THAT IS NOT CONTROLLED WITH YOUR NAUSEA MEDICATION *UNUSUAL SHORTNESS OF BREATH *UNUSUAL BRUISING OR BLEEDING *URINARY PROBLEMS (pain or burning when urinating, or frequent urination) *BOWEL PROBLEMS (unusual diarrhea, constipation, pain near the anus) TENDERNESS IN MOUTH AND THROAT WITH OR WITHOUT PRESENCE OF ULCERS (sore throat, sores in mouth, or a toothache) UNUSUAL RASH, SWELLING OR PAIN  UNUSUAL VAGINAL DISCHARGE OR ITCHING   Items with * indicate a potential emergency and should be followed up as soon as possible or go to the Emergency Department if any problems should occur.  Please show the CHEMOTHERAPY ALERT CARD or IMMUNOTHERAPY ALERT CARD at  check-in to the Emergency Department and triage nurse.  Should you have questions after your visit or need to cancel or reschedule your appointment, please contact [facility]  Dept: [phone]  and follow the prompts.  Office hours are 8:00 a.m. to 4:30 p.m. Monday - Friday. Please note that voicemails left after 4:00 p.m. may not be returned until the following business day.  We are closed weekends and major holidays. You have access to a nurse at all times for urgent questions. Please call the main number to the clinic Dept: [phone] and follow the prompts.   For any non-urgent questions, you may also contact your provider using MyChart. We now offer e-Visits for anyone 29 and older to request care online for non-urgent symptoms. For details visit [email or web address].   Also download the MyChart app! Go to the app store, search "MyChart", open the app, select [facility], and log in with your MyChart username and password.  Due to Covid, a mask is required upon entering the hospital/clinic. If you do not have a mask, one will be given to you upon arrival. For doctor visits, patients may have 1 support person aged 35 or older with them. For treatment visits, patients cannot have anyone with them due to current Covid guidelines and our immunocompromised population.

## 2021-03-06 NOTE — Progress Notes (Signed)
[facility] [phone]   [phone]  OFFICE PROGRESS NOTE  [name], MD [place] [address]  DIAGNOSIS: stage IV (TX, N2, M1 C) non-small cell lung cancer, adenocarcinoma diagnosed in August 2021 and presented with solitary brain metastasis in addition to mediastinal lymphadenopathy.  PDL1 Expression 70%   Molecular Biomarkers:  Tumor Mutational Burden - 52 Muts/Mb Microsatellite status - MS-Stable Genomic Findings For a complete list of the genes assayed, please refer to the Appendix. NF1 E1694* MTAP loss exons 2-8 RICTOR amplification ATRX A419V BRAF K483E CDKN2A/B CDKN2A loss, CDKN2B loss DNMT3A E205* FGF10 amplification NTRK1 amplification - equivocal? 7 Disease relevant genes with no reportable alterations: ALK, EGFR, ERBB2, KRAS, MET, RET, ROS1    PRIOR THERAPY:  1) Status post right craniotomy with tumor resection followed by [facility] to solitary brain metastasis under the care of Dr. Lisbeth Renshaw and Dr. Sherral Hammers. 2) Concurrent chemoradiation with weekly carboplatin for AUC of 2 and paclitaxel 45 mg/M2.  First dose 02/01/2020. Status post 5 cycles.  Last dose was given February 29, 2020.   CURRENT THERAPY:  First-line treatment with immunotherapy with Libtayo (Cempilimab) 350 mg IV every 3 weeks.  First dose April 25, 2020.  Status post 14 cycles.  INTERVAL HISTORY: Jared Shea. 65 y.o. male returns to the clinic today for follow-up visit.  The patient is feeling fine today with no concerning complaints.  He continues to have dizzy spells but he had MRI of the brain performed recently that was unremarkable for any brain metastasis.  He denied having any current chest pain, shortness of breath, cough or hemoptysis.  He denied having any fever or chills.  He has no nausea, vomiting, diarrhea or constipation.  He has no headache or visual changes.  He is here today for evaluation before starting cycle #15 of his  treatment.Marland Kitchen   MEDICAL HISTORY: Past Medical History:  Diagnosis Date   Brain mass    Bursitis of right hip    Chest pain 08/12/2018   Chronic cough    Dependence on nicotine from cigarettes    Diabetes mellitus without complication ([place])    Essential hypertension 07/28/2018   nscl ca dx'd 12/2019   Seizures ([place])    Viral illness 03/23/2019    ALLERGIES:  is allergic to penicillins.  MEDICATIONS:  Current Outpatient Medications  Medication Sig Dispense Refill   HYDROcodone-acetaminophen (NORCO) 5-325 MG tablet Take 1 tablet by mouth every 6 (six) hours as needed for moderate pain or severe pain. 20 tablet 0   ibuprofen (ADVIL) 200 MG tablet Take 400 mg by mouth every 6 (six) hours as needed for mild pain.     levETIRAcetam (KEPPRA) 750 MG tablet Take 1 tablet (750 mg total) by mouth 2 (two) times daily. 180 tablet 4   losartan (COZAAR) 25 MG tablet Take 1 tablet (25 mg total) by mouth daily. 30 tablet 11   Melatonin 10 MG TABS Take 10 mg by mouth at bedtime as needed (sleep).     nortriptyline (PAMELOR) 25 MG capsule Take 2 capsules (50 mg total) by mouth at bedtime. 60 capsule 11   pseudoephedrine-acetaminophen (TYLENOL SINUS) 30-500 MG TABS tablet Take 1 tablet by mouth every 4 (four) hours as needed (sinus pressure).     SUMAtriptan (IMITREX) 25 MG tablet Take 1 tablet (25 mg total) by mouth every 2 (two) hours as needed for migraine. May repeat in 2 hours if headache persists or recurs. (  Patient not taking: No sig reported) 10 tablet 6   No current facility-administered medications for this visit.    SURGICAL HISTORY:  Past Surgical History:  Procedure Laterality Date   APPLICATION OF CRANIAL NAVIGATION N/A 01/07/2020   Procedure: APPLICATION OF CRANIAL NAVIGATION;  Surgeon: Ashok Pall, MD;  Location: [facility];  Service: Neurosurgery;  Laterality: N/A;   CRANIOTOMY Right 01/07/2020   Procedure: RIGHT CRANIOTOMY FOR TUMOR RESECTION;  Surgeon: Ashok Pall, MD;  Location: [employer];   Service: Neurosurgery;  Laterality: Right;  rm 21   ENDOBRONCHIAL ULTRASOUND N/A 12/21/2019   Procedure: ENDOBRONCHIAL ULTRASOUND;  Surgeon: Laurin Coder, MD;  Location: [facility];  Service: Pulmonary;  Laterality: N/A;   FINE NEEDLE ASPIRATION  12/21/2019   Procedure: FINE NEEDLE ASPIRATION (FNA) LINEAR;  Surgeon: Laurin Coder, MD;  Location: [facility];  Service: Pulmonary;;   IR IMAGING GUIDED PORT INSERTION  03/03/2020   NO PAST SURGERIES     VIDEO BRONCHOSCOPY N/A 12/21/2019   Procedure: VIDEO BRONCHOSCOPY WITHOUT FLUORO;  Surgeon: Laurin Coder, MD;  Location: [facility];  Service: Pulmonary;  Laterality: N/A;    REVIEW OF SYSTEMS:  A comprehensive review of systems was negative except for: Neurological: positive for dizziness   PHYSICAL EXAMINATION: General appearance: alert, cooperative, and no distress Head: Normocephalic, without obvious abnormality, atraumatic Neck: no adenopathy, no JVD, supple, symmetrical, trachea midline, and thyroid not enlarged, symmetric, no tenderness/mass/nodules Lymph nodes: Cervical, supraclavicular, and axillary nodes normal. Resp: clear to auscultation bilaterally Back: symmetric, no curvature. ROM normal. No CVA tenderness. Cardio: regular rate and rhythm, S1, S2 normal, no murmur, click, rub or gallop GI: soft, non-tender; bowel sounds normal; no masses,  no organomegaly Extremities: extremities normal, atraumatic, no cyanosis or edema  ECOG PERFORMANCE STATUS: 1 - Symptomatic but completely ambulatory  Blood pressure (!) 160/89, pulse 82, temperature (!) 97 F (36.1 C), temperature source Tympanic, resp. rate 19, height $RemoveBe'6\' 1"'OFmIXRWab$  (1.854 m), weight 200 lb 12.8 oz (91.1 kg), SpO2 100 %.  LABORATORY DATA: Lab Results  Component Value Date   WBC 7.1 03/06/2021   HGB 12.3 (L) 03/06/2021   HCT 35.8 (L) 03/06/2021   MCV 87.5 03/06/2021   PLT 313 03/06/2021      Chemistry      Component Value Date/Time   NA 143 02/15/2021  1342   NA 140 11/23/2019 1516   K 3.8 02/15/2021 1342   CL 109 02/15/2021 1342   CO2 26 02/15/2021 1342   BUN 12 02/15/2021 1342   BUN 14 11/23/2019 1516   CREATININE 0.83 02/15/2021 1342      Component Value Date/Time   CALCIUM 9.4 02/15/2021 1342   ALKPHOS 79 02/15/2021 1342   AST 27 02/15/2021 1342   ALT 43 02/15/2021 1342   BILITOT 0.9 02/15/2021 1342       RADIOGRAPHIC STUDIES: MR BRAIN W WO CONTRAST  Result Date: 03/04/2021 CLINICAL DATA:  Brain/CNS neoplasm. Assess treatment response. Small cell lung cancer with previous craniotomy. EXAM: MRI HEAD WITHOUT AND WITH CONTRAST TECHNIQUE: Multiplanar, multiecho pulse sequences of the brain and surrounding structures were obtained without and with intravenous contrast. CONTRAST:  51mL GADAVIST GADOBUTROL 1 MMOL/ML IV SOLN COMPARISON:  Head CT 02/01/2021.  MRI 10/29/2020. FINDINGS: Brain: Diffusion imaging does not show any acute or subacute infarction or other cause of restricted diffusion. No focal abnormality affects the brainstem or cerebellum. Left cerebral hemisphere shows minimal small vessel change of the white matter. On the right, there is been  previous posterior frontal craniotomy for tumor resection. There is a small area of underlying gliosis with hemosiderin deposition. No evidence of mass effect, edema or contrast enhancement. No new intracranial finding. Vascular: Major vessels at the base of the brain show flow. Skull and upper cervical spine: Otherwise negative Sinuses/Orbits: Clear/normal Other: None IMPRESSION: No change. No evidence of residual or recurrent disease. No new metastatic lesion. Previous right posterior frontal craniotomy for resection of metastatic lesion. Underlying gliosis and hemosiderin deposition as seen previously. Electronically Signed   By: Nelson Chimes M.D.   On: 03/04/2021 13:29     ASSESSMENT AND PLAN: This is a very pleasant 65 years old African-American male recently diagnosed with a stage IV  (TX, N2, M1c) non-small cell lung cancer, adenocarcinoma presented with solitary brain metastasis in addition to right hilar and mediastinal lymphadenopathy diagnosed in August 2021.  The patient is status post right craniotomy with resection of the solitary brain metastasis with SRS. His PET scan showed no evidence of metastatic disease outside the chest. The patient was treated with a course of concurrent chemoradiation with weekly carboplatin and paclitaxel status post 5 cycles.  He tolerated his treatment well except for dysphagia and odynophagia. His PD-L1 expression is 70%. The patient started treatment with immunotherapy with Cemiplimab 350 mg IV every 3 weeks status post 14 cycles.   The patient has been tolerating this treatment well with no concerning adverse effect except for the arthralgia.  He continues to complain of dizzy spells but repeat MRI of the brain showed no concerning findings for metastatic disease or any active process in the brain. I recommended for the patient to proceed with cycle #15 of his treatment today as planned. I will see him back for follow-up visit in 3 weeks for evaluation before the next cycle of his treatment.  I will consider repeating his imaging studies after the next cycle of his treatment. He was advised to call immediately if he has any other concerning symptoms in the interval. The patient voices understanding of current disease status and treatment options and is in agreement with the current care plan.  All questions were answered. The patient knows to call the clinic with any problems, questions or concerns. We can certainly see the patient much sooner if necessary.   Disclaimer: This note was dictated with voice recognition software. Similar sounding words can inadvertently be transcribed and may not be corrected upon review.

## 2021-03-14 ENCOUNTER — Ambulatory Visit (INDEPENDENT_AMBULATORY_CARE_PROVIDER_SITE_OTHER): Payer: Medicare HMO | Admitting: Neurology

## 2021-03-14 ENCOUNTER — Other Ambulatory Visit: Payer: Self-pay

## 2021-03-14 ENCOUNTER — Telehealth: Payer: Self-pay | Admitting: Neurology

## 2021-03-14 ENCOUNTER — Encounter: Payer: Self-pay | Admitting: Neurology

## 2021-03-14 VITALS — BP 155/83 | HR 91 | Ht 73.0 in | Wt 199.0 lb

## 2021-03-14 DIAGNOSIS — R4189 Other symptoms and signs involving cognitive functions and awareness: Secondary | ICD-10-CM | POA: Insufficient documentation

## 2021-03-14 DIAGNOSIS — G40209 Localization-related (focal) (partial) symptomatic epilepsy and epileptic syndromes with complex partial seizures, not intractable, without status epilepticus: Secondary | ICD-10-CM

## 2021-03-14 DIAGNOSIS — C7931 Secondary malignant neoplasm of brain: Secondary | ICD-10-CM

## 2021-03-14 DIAGNOSIS — R413 Other amnesia: Secondary | ICD-10-CM

## 2021-03-14 DIAGNOSIS — H539 Unspecified visual disturbance: Secondary | ICD-10-CM

## 2021-03-14 HISTORY — DX: Other symptoms and signs involving cognitive functions and awareness: R41.89

## 2021-03-14 NOTE — Progress Notes (Addendum)
HISTORICAL  Jared Shea. is a 65 year old male, seen in request by his primary care physician Dr.Agyei, Obed K, for evaluation of complex partial seizure, metastatic brain lesion, initial evaluation was on December 23, 2019.  I reviewed and summarized the referring note. He is a longtime smoker, works as [name]  In July 2021, at work, while hooking up a wall socket, he suddenly felt hot, then started to have mouth twitching lasting for 5 minutes, followed by confusion afterwards, then went away  Second episode was 2 weeks later, he woke up from sleep with left mouth muscle twitching, difficulty breathing, lasting for 5 minutes, again followed by postevent confusion, lasting few more minutes  He was diagnosed with complex partial seizure, personally reviewed CT, and MRI of the brain with and without contrast December 14, 2019, right posterior frontal cortex 13.5 mm enhancing lesion with surrounding vasogenic edema, most likely a solitary metastatic.  He has radiation oncology appointment pending with Dr. Lisbeth Renshaw on December 24, 2019  CT of the chest/pelvis with without contrast December 14, 2019, mediastinum and left hilar adenopathy, 4 mm left upper lobe nodule, low attenuating focus inferior to left hilum likely enlarged lymph node  He had transbronchial needle aspiration with flexible bronchoscopy under endobronchial ultrasound guidance on December 21 2019, pathology confirmed malignant cells consistent with metastatic non-small cell carcinoma  Laboratory evaluation August 2021, negative HIV, BMP, CBC  He complains of frequent headaches, dizziness, fatigue taking Keppra 750 twice a day, there was no recurrent seizure  UPDATE Jun 27 2020: He underwent right craniotomy for tumor resection by Dr. Christella Noa on January 07, 2020 pathology consistent with metastatic adenocarcinoma.  I reviewed most recent oncology evaluation by Dr. Mickeal Skinner on June 20, 2020, stage IV non-small cell carcinoma, with  metastatic lesion to brain, radiation therapy to left arm all from September 13 to March 14, 2020, also received brain radiation in April 22, 2020, received chemo/immunotherapy cemiplimab  since April 29, 2020, complains of frequent headaches was given short course of dexamethasone 4 mg for 5 days, suggested ibuprofen 800 mg as needed,  I personally reviewed MRI of brain with without contrast on April 14, 2020, in comparison with previous MRI in August 2021, resection cavity no posterior right frontal lobe which resulted edema, no evidence of locally recurrent tumor, 3 new lesions, left cerebellar hemisphere, right occipital lobe, left frontal lobe  CT chest in November 2021, interval decrease size of dominant left infrahilar nodes, hypermetabolic on PET/CT, consistent with response to therapy, stable subcardinal and left hilar lymph nodes.  Laboratory evaluation in February 2022, CBC showed hemoglobin of 11.9, mild elevated liver functional test AST 45, ALT 48, normal TSH 1.2, A1c 4.4,  He reported a history of intermittent headaches since 20s, getting worse since treatment in summer 2021, no complaints of 3-4 times of headache each week, short lasting less than 30 minutes, throbbing,  no light noise sensitivity, no nausea, worry about the headache would cause more brain damage, he has been taking frequent ibuprofen, 2 tablets twice a day,4-5 times each week,  He denies new onset focal symptoms, had no recurrent seizure  UPDATE Mar 14 2021: He drove himself to clinic today, reported he lives with his son, complains of significant memory loss over the past few months, sometimes got lost driving there is no reported seizure, compliant with his Keppra most of the time, Today he also complains of significant multiple joints pain, shoulder, elbow, knee pain, blurry vision, have not seen an  eye doctor for many years  He also complains of episode of blurry vision with sudden positional change,  bending down and then getting up, he has movement of blurry, cannot see anything  I personally reviewed MRI brain w/wo in Oct 2022 the right, there is been previous posterior frontal craniotomy for tumor resection. There is a small area of underlying gliosis with hemosiderin deposition. No evidence of mass effect, edema or contrast enhancement. No new intracranial finding.  Laboratory evaluations in October 2022 showed normal TSH, CMP, CBC, hemoglobin of 12.3,  REVIEW OF SYSTEMS: Full 14 system review of systems performed and notable only for as above All other review of systems were negative.  ALLERGIES: Allergies  Allergen Reactions   Penicillins Rash    Did it involve swelling of the face/tongue/throat, SOB, or low BP? N Did it involve sudden or severe rash/hives, skin peeling, or any reaction on the inside of your mouth or nose? Y Did you need to seek medical attention at a hospital or doctor's office? Y When did it last happen?    childhood   If all above answers are "NO", may proceed with cephalosporin use.     HOME MEDICATIONS: Current Outpatient Medications  Medication Sig Dispense Refill   HYDROcodone-acetaminophen (NORCO) 5-325 MG tablet Take 1 tablet by mouth every 6 (six) hours as needed for moderate pain or severe pain. 20 tablet 0   ibuprofen (ADVIL) 200 MG tablet Take 400 mg by mouth every 6 (six) hours as needed for mild pain.     levETIRAcetam (KEPPRA) 750 MG tablet Take 1 tablet (750 mg total) by mouth 2 (two) times daily. 180 tablet 4   losartan (COZAAR) 25 MG tablet Take 1 tablet (25 mg total) by mouth daily. 30 tablet 11   Melatonin 10 MG TABS Take 10 mg by mouth at bedtime as needed (sleep).     No current facility-administered medications for this visit.    PAST MEDICAL HISTORY: Past Medical History:  Diagnosis Date   Brain mass    Bursitis of right hip    Chest pain 08/12/2018   Chronic cough    Dependence on nicotine from cigarettes    Diabetes  mellitus without complication ([place])    Essential hypertension 07/28/2018   nscl ca dx'd 12/2019   Seizures ([place])    Viral illness 03/23/2019    PAST SURGICAL HISTORY: Past Surgical History:  Procedure Laterality Date   APPLICATION OF CRANIAL NAVIGATION N/A 01/07/2020   Procedure: APPLICATION OF CRANIAL NAVIGATION;  Surgeon: Ashok Pall, MD;  Location: [facility];  Service: Neurosurgery;  Laterality: N/A;   CRANIOTOMY Right 01/07/2020   Procedure: RIGHT CRANIOTOMY FOR TUMOR RESECTION;  Surgeon: Ashok Pall, MD;  Location: [facility];  Service: Neurosurgery;  Laterality: Right;  rm 21   ENDOBRONCHIAL ULTRASOUND N/A 12/21/2019   Procedure: ENDOBRONCHIAL ULTRASOUND;  Surgeon: Laurin Coder, MD;  Location: [facility];  Service: Pulmonary;  Laterality: N/A;   FINE NEEDLE ASPIRATION  12/21/2019   Procedure: FINE NEEDLE ASPIRATION (FNA) LINEAR;  Surgeon: Laurin Coder, MD;  Location: [facility];  Service: Pulmonary;;   IR IMAGING GUIDED PORT INSERTION  03/03/2020   NO PAST SURGERIES     VIDEO BRONCHOSCOPY N/A 12/21/2019   Procedure: VIDEO BRONCHOSCOPY WITHOUT FLUORO;  Surgeon: Laurin Coder, MD;  Location: [facility];  Service: Pulmonary;  Laterality: N/A;    FAMILY HISTORY: Family History  Problem Relation Age of Onset   Heart disease Mother  CABG   Kidney disease Sister    Diabetes Brother    Kidney disease Brother        KIDNEY TRANSPLANT   Hypertension Sister    Healthy Daughter    Healthy Daughter    Healthy Son     SOCIAL HISTORY: Social History   Socioeconomic History   Marital status: Divorced    Spouse name: Not on file   Number of children: Not on file   Years of education: Not on file   Highest education level: Not on file  Occupational History   Not on file  Tobacco Use   Smoking status: Former    Packs/day: 1.00    Years: 48.00    Pack years: 48.00    Types: Cigarettes    Start date: 08/07/1970    Quit date: 12/15/2019    Years since quitting: 1.2    Smokeless tobacco: Never  Vaping Use   Vaping Use: Never used  Substance and Sexual Activity   Alcohol use: Not Currently   Drug use: Never   Sexual activity: Not on file    Comment: YES  Other Topics Concern   Not on file  Social History Narrative   Not on file   Social Determinants of Health   Financial Resource Strain: Not on file  Food Insecurity: Not on file  Transportation Needs: Not on file  Physical Activity: Not on file  Stress: Not on file  Social Connections: Not on file  Intimate Partner Violence: Not on file     PHYSICAL EXAM   Vitals:   03/14/21 1116  BP: (!) 155/83  Pulse: 91  Weight: 199 lb (90.3 kg)  Height: _0  (1.854 m)   Not recorded     Body mass index is 26.25 kg/m.  PHYSICAL EXAMNIATION:  Gen: NAD, conversant, well nourised, well groomed                     Cardiovascular: Regular rate rhythm, no peripheral edema, warm, nontender. Eyes: Conjunctivae clear without exudates or hemorrhage Neck: Supple, no carotid bruits. Pulmonary: Clear to auscultation bilaterally   NEUROLOGICAL EXAM:  MENTAL STATUS: Speech/cognition: Awake, alert, oriented to history taking and casual conversation Montreal Cognitive Assessment  03/14/2021  Visuospatial/ Executive (0/5) 2  Naming (0/3) 3  Attention: Read list of digits (0/2) 2  Attention: Read list of letters (0/1) 1  Attention: Serial 7 subtraction starting at 100 (0/3) 0  Language: Repeat phrase (0/2) 1  Language : Fluency (0/1) 0  Abstraction (0/2) 1  Delayed Recall (0/5) 0  Orientation (0/6) 3  Total 13      CRANIAL NERVES: CN II: Visual fields are full to confrontation. Pupils are round equal and briskly reactive to light. CN III, IV, VI: extraocular movement are normal. No ptosis. CN V: Facial sensation is intact to light touch CN VII: Face is symmetric with normal eye closure  CN VIII: Hearing is normal to causal conversation. CN IX, X: Phonation is normal. CN XI: Head turning  and shoulder shrug are intact  MOTOR: Mild fixation of left arm upon rapid rotating movement  REFLEXES: Reflexes are 2+ and symmetric at the biceps, triceps, knees, and ankles. Plantar responses are flexor.  SENSORY: Intact to light touch, pinprick and vibratory sensation are intact in fingers and toes.  COORDINATION: There is no trunk or limb dysmetria noted.  GAIT/STANCE: He can get up from seated position, pushing on his knees, mildly antalgic due to bilateral knee  pain   DIAGNOSTIC DATA (LABS, IMAGING, TESTING) - I reviewed patient records, labs, notes, testing and imaging myself where available.   ASSESSMENT AND PLAN  Jared Shea. is a 65 y.o. male   Complex partial seizure due to metastatic right posterior frontal lesion Brain metastatic lung  cancer, adenocarcinoma, status post right craniotomy in August 2021 Frequent headaches  Keep Keppra 750 mg twice a day, he has no recurrent seizure, check level  Repeat MRI of the brain with without contrast in October 2022 showed no change, no evidence of residual recurrent metastatic brain lesion   Worsening blurry vision, transient visual block out with positional change,  Ultrasound of carotid artery to rule out carotid artery stenosis  Refer to ophthalmologist  Memory loss  MoCA examination is only 13/30,  B12 level to rule out treatable etiology, can be combined with next oncology laboratory evaluation  Worsening multiple joint pain,  Osteoarthritis, versus other etiology,  Last PET scan was in October 2021,  Will leave urther evaluation decision by his oncologist  Total time spent reviewing the chart, obtaining history, examined patient, ordering tests, documentation, consultations and family, care coordination was 2 minutes      Marcial Pacas, M.D. Ph.D.  [facility] [address], [place], [place] [address] Ph: [phone] Fax: [phone]  CC:  Jean Rosenthal, MD [address],  [place] [id number]    Addendum: [facility] evaluation March 16, 2021, blurred vision bilaterally, uncertain of course, no eye findings, brain metastatic cancer from lung, stage IV non-small cell carcinoma, status post a right craniotomy for tumor resection in August 2021, visual field is full, will repeat visual field in 8 months, to OCT then, combined cataract OD, nuclear sclerosis OS,

## 2021-03-14 NOTE — Telephone Encounter (Signed)
Sent to Dr. Katy Fitch ph # [phone]

## 2021-03-14 NOTE — Telephone Encounter (Signed)
[employer] no auth require or Medicaid no auth require, sent message to Butch Penny she will reach out to the patient to schedule.

## 2021-03-21 ENCOUNTER — Ambulatory Visit (HOSPITAL_COMMUNITY)
Admission: RE | Admit: 2021-03-21 | Discharge: 2021-03-21 | Disposition: A | Discharge status: Home / Self Care | Payer: Medicare HMO | Source: Ambulatory Visit | Attending: Neurology | Admitting: Neurology

## 2021-03-21 ENCOUNTER — Other Ambulatory Visit: Payer: Self-pay

## 2021-03-21 ENCOUNTER — Encounter: Payer: Self-pay | Admitting: Neurology

## 2021-03-21 DIAGNOSIS — G40209 Localization-related (focal) (partial) symptomatic epilepsy and epileptic syndromes with complex partial seizures, not intractable, without status epilepticus: Secondary | ICD-10-CM | POA: Diagnosis present

## 2021-03-21 DIAGNOSIS — H539 Unspecified visual disturbance: Secondary | ICD-10-CM

## 2021-03-21 DIAGNOSIS — R413 Other amnesia: Secondary | ICD-10-CM | POA: Insufficient documentation

## 2021-03-21 DIAGNOSIS — C7931 Secondary malignant neoplasm of brain: Secondary | ICD-10-CM | POA: Diagnosis present

## 2021-03-21 NOTE — Progress Notes (Signed)
[facility] OFFICE PROGRESS NOTE  Jared Qua, MD [address] [place] [address]  DIAGNOSIS: Stage IV (TX, N2, M1 C) non-small cell lung cancer, adenocarcinoma diagnosed in August 2021 and presented with solitary brain metastasis in addition to mediastinal lymphadenopathy.   PDL1 Expression 70%   Molecular Biomarkers:  Tumor Mutational Burden - 52 Muts/Mb Microsatellite status - MS-Stable Genomic Findings For a complete list of the genes assayed, please refer to the Appendix. NF1 E1694* MTAP loss exons 2-8 RICTOR amplification ATRX A419V BRAF K483E CDKN2A/B CDKN2A loss, CDKN2B loss DNMT3A E205* FGF10 amplification NTRK1 amplification - equivocal? 7 Disease relevant genes with no reportable alterations: ALK, EGFR, ERBB2, KRAS, MET, RET, ROS1     PRIOR THERAPY:  1) Status post right craniotomy with tumor resection followed by [facility] to solitary brain metastasis under the care of Dr. Mitzi Hansen and Dr. Murrell Redden. 2) Concurrent chemoradiation with weekly carboplatin for AUC of 2 and paclitaxel 45 mg/M2.  First dose 02/01/2020. Status post 5 cycles.  Last dose was given February 29, 2020.  CURRENT THERAPY:  First-line treatment with immunotherapy with Libtayo (Cempilimab) 350 mg IV every 3 weeks.  First dose April 25, 2020.  Status post 15 cycles.    INTERVAL HISTORY: Jared Shea. 65 y.o. male returns to the clinic today for a follow-up visit.  The patient is feeling fairly well today without any concerning complaints except for some ongoing memory deficits and visual changes.  He saw his neurologist recently for the memory loss.  He is ordering a ultrasound of the carotid artery to rule out stenosis as an etiology of his visual changes.  He also referred him to ophthalmology.  The patient is overdue for having his eyes examined. He saw both providers. His ultrasound was negative for abnormality. Due to the patient's memory changes, he recommended ordering vitamin  B12 which I have placed the order for today for next lab draw. Neuro-oncology repeated his brain MRI on 03/04/21 which was negative for metastatic disease to the brain. He reports a mild fleeting headache if he bends over that lasts a few seconds before subsiding. He believes it is sinus related.   The patient has significant arthralgias/myalgias associated with his immunotherapy. He is currently prescribed norco. He denies any fever or chills. He lost about 5 lbs. He used to be on Remeron for his decreased appetite but he stopped taking it. He does not think it was working. He is not interested in resuming this. He denies any changes with his breathing. He reports mild shortness of breath with bending over. He also has a mild cough. He was seen by his PCP last week for bacterial sinusitis. He was given a prescription for doxycycline which has helped.  He denies any recent hemoptysis or chest pain.  He denies any nausea, vomiting, diarrhea, or constipation.  He denies any rashes or skin changes.  He is here today for evaluation before proceeding with cycle #16 today as scheduled.    MEDICAL HISTORY: Past Medical History:  Diagnosis Date   Brain mass    Bursitis of right hip    Chest pain 08/12/2018   Chronic cough    Dependence on nicotine from cigarettes    Diabetes mellitus without complication (HCC)    Essential hypertension 07/28/2018   nscl ca dx'd 12/2019   Seizures (HCC)    Viral illness 03/23/2019    ALLERGIES:  is allergic to penicillins.  MEDICATIONS:  Current Outpatient Medications  Medication Sig  Dispense Refill   doxycycline (VIBRA-TABS) 100 MG tablet Take 1 tablet (100 mg total) by mouth 2 (two) times daily. 20 tablet 0   levETIRAcetam (KEPPRA) 750 MG tablet Take 1 tablet (750 mg total) by mouth 2 (two) times daily. 180 tablet 4   losartan (COZAAR) 25 MG tablet Take 1 tablet (25 mg total) by mouth daily. 30 tablet 11   meloxicam (MOBIC) 7.5 MG tablet Take 1 tablet (7.5 mg total)  by mouth daily. 90 tablet 0   nortriptyline (PAMELOR) 25 MG capsule      HYDROcodone-acetaminophen (NORCO) 5-325 MG tablet Take 1 tablet by mouth every 6 (six) hours as needed for moderate pain or severe pain. (Patient not taking: Reported on 03/27/2021) 20 tablet 0   Melatonin 10 MG TABS Take 10 mg by mouth at bedtime as needed (sleep). (Patient not taking: Reported on 03/27/2021)     No current facility-administered medications for this visit.    SURGICAL HISTORY:  Past Surgical History:  Procedure Laterality Date   APPLICATION OF CRANIAL NAVIGATION N/A 01/07/2020   Procedure: APPLICATION OF CRANIAL NAVIGATION;  Surgeon: Coletta Memos, MD;  Location: [facility] OR;  Service: Neurosurgery;  Laterality: N/A;   CRANIOTOMY Right 01/07/2020   Procedure: RIGHT CRANIOTOMY FOR TUMOR RESECTION;  Surgeon: Coletta Memos, MD;  Location: [facility] OR;  Service: Neurosurgery;  Laterality: Right;  rm 21   ENDOBRONCHIAL ULTRASOUND N/A 12/21/2019   Procedure: ENDOBRONCHIAL ULTRASOUND;  Surgeon: Tomma Lightning, MD;  Location: [facility] ENDOSCOPY;  Service: Pulmonary;  Laterality: N/A;   FINE NEEDLE ASPIRATION  12/21/2019   Procedure: FINE NEEDLE ASPIRATION (FNA) LINEAR;  Surgeon: Tomma Lightning, MD;  Location: [facility] ENDOSCOPY;  Service: Pulmonary;;   IR IMAGING GUIDED PORT INSERTION  03/03/2020   NO PAST SURGERIES     VIDEO BRONCHOSCOPY N/A 12/21/2019   Procedure: VIDEO BRONCHOSCOPY WITHOUT FLUORO;  Surgeon: Tomma Lightning, MD;  Location: [facility] ENDOSCOPY;  Service: Pulmonary;  Laterality: N/A;    REVIEW OF SYSTEMS:   Review of Systems  Constitutional: Positive for fatigue and weight loss. Negative for appetite change, chills, and fever.  HENT: Negative for mouth sores, nosebleeds, sore throat and trouble swallowing.   Eyes: Positive for visual changes with bending over. Respiratory: Positive for mild cough and dyspnea on exertion. Negative for hemoptysis and wheezing.   Cardiovascular: Negative for chest pain and leg  swelling.  Gastrointestinal: Negative for abdominal pain, constipation, diarrhea, nausea and vomiting.  Genitourinary: Negative for bladder incontinence, difficulty urinating, dysuria, frequency and hematuria.   Musculoskeletal: Positive for polyarthralgia.  Skin: Negative for itching and rash.  Neurological: Positive for occasional headaches. Negative for dizziness, extremity weakness, gait problem, light-headedness and seizures.  Hematological: Negative for adenopathy. Does not bruise/bleed easily.  Psychiatric/Behavioral: Positive for memory changes since undergoing treatment for metastatic disease to the brain. Negative for confusion, depression and sleep disturbance. The patient is not nervous/anxious.     PHYSICAL EXAMINATION:  Blood pressure 128/77, pulse 89, temperature 98.2 F (36.8 C), temperature source Axillary, resp. rate 17, height 6\' 1"  (1.854 m), weight 195 lb 14.4 oz (88.9 kg), SpO2 98 %.  ECOG PERFORMANCE STATUS: 1  Physical Exam  Constitutional: Oriented to person, place, and time and well-developed, well-nourished, and in no distress.  HENT:  Head: Normocephalic and atraumatic.  Mouth/Throat: Oropharynx is clear and moist. No oropharyngeal exudate.  Eyes: Conjunctivae are normal. Right eye exhibits no discharge. Left eye exhibits no discharge. No scleral icterus.  Neck: Normal range of motion. Neck supple.  Cardiovascular: Normal rate, regular rhythm, normal heart sounds and intact distal pulses.   Pulmonary/Chest: Effort normal and breath sounds normal. No respiratory distress. No wheezes. No rales.  Abdominal: Soft. Bowel sounds are normal. Exhibits no distension and no mass. There is no tenderness.  Musculoskeletal: Normal range of motion. Exhibits no edema.  Lymphadenopathy:    No cervical adenopathy.  Neurological: Alert and oriented to person, place, and time. Exhibits normal muscle tone. Gait normal. Coordination normal.  Skin: Skin is warm and dry. No rash  noted. Not diaphoretic. No erythema. No pallor.  Psychiatric: Mood, memory and judgment normal.  Vitals reviewed.  LABORATORY DATA: Lab Results  Component Value Date   WBC 6.8 03/27/2021   HGB 12.9 (L) 03/27/2021   HCT 37.8 (L) 03/27/2021   MCV 88.7 03/27/2021   PLT 330 03/27/2021      Chemistry      Component Value Date/Time   NA 139 03/27/2021 1028   NA 140 11/23/2019 1516   K 4.1 03/27/2021 1028   CL 107 03/27/2021 1028   CO2 24 03/27/2021 1028   BUN 16 03/27/2021 1028   BUN 14 11/23/2019 1516   CREATININE 0.83 03/27/2021 1028      Component Value Date/Time   CALCIUM 9.2 03/27/2021 1028   ALKPHOS 77 03/27/2021 1028   AST 25 03/27/2021 1028   ALT 31 03/27/2021 1028   BILITOT 0.7 03/27/2021 1028       RADIOGRAPHIC STUDIES:  MR BRAIN W WO CONTRAST  Result Date: 03/04/2021 CLINICAL DATA:  Brain/CNS neoplasm. Assess treatment response. Small cell lung cancer with previous craniotomy. EXAM: MRI HEAD WITHOUT AND WITH CONTRAST TECHNIQUE: Multiplanar, multiecho pulse sequences of the brain and surrounding structures were obtained without and with intravenous contrast. CONTRAST:  9mL GADAVIST GADOBUTROL 1 MMOL/ML IV SOLN COMPARISON:  Head CT 02/01/2021.  MRI 10/29/2020. FINDINGS: Brain: Diffusion imaging does not show any acute or subacute infarction or other cause of restricted diffusion. No focal abnormality affects the brainstem or cerebellum. Left cerebral hemisphere shows minimal small vessel change of the white matter. On the right, there is been previous posterior frontal craniotomy for tumor resection. There is a small area of underlying gliosis with hemosiderin deposition. No evidence of mass effect, edema or contrast enhancement. No new intracranial finding. Vascular: Major vessels at the base of the brain show flow. Skull and upper cervical spine: Otherwise negative Sinuses/Orbits: Clear/normal Other: None IMPRESSION: No change. No evidence of residual or recurrent  disease. No new metastatic lesion. Previous right posterior frontal craniotomy for resection of metastatic lesion. Underlying gliosis and hemosiderin deposition as seen previously. Electronically Signed   By: Paulina Fusi M.D.   On: 03/04/2021 13:29   VAS US CAROTID  Result Date: 03/21/2021 Carotid Arterial Duplex Study Patient Name:  Kayvon Broaddus.  Date of Exam:   03/21/2021 Medical Rec #: [id number]        Accession #:    [id number] Date of Birth: [birth year]        Patient Gender: M Patient Age:   65 years Exam Location:  [facility] Procedure:      VAS US CAROTID Referring Phys: Levert Feinstein --------------------------------------------------------------------------------  Indications:       Visual disturbance. Risk Factors:      Hypertension, Diabetes, past history of smoking. Other Factors:     CA (metastatic). Comparison Study:  No previous exams Performing Technologist: Jody Hill RVT, RDMS  Examination Guidelines: A complete evaluation includes B-mode imaging, spectral Doppler, color  Doppler, and power Doppler as needed of all accessible portions of each vessel. Bilateral testing is considered an integral part of a complete examination. Limited examinations for reoccurring indications may be performed as noted.  Right Carotid Findings: +----------+--------+--------+--------+------------------+------------------+           PSV cm/sEDV cm/sStenosisPlaque DescriptionComments           +----------+--------+--------+--------+------------------+------------------+ CCA Prox  86      12                                intimal thickening +----------+--------+--------+--------+------------------+------------------+ CCA Distal81      12                                intimal thickening +----------+--------+--------+--------+------------------+------------------+ ICA Prox  31      11                                                    +----------+--------+--------+--------+------------------+------------------+ ICA Distal45      14                                                   +----------+--------+--------+--------+------------------+------------------+ ECA       54      7                                                    +----------+--------+--------+--------+------------------+------------------+ +----------+--------+-------+----------------+-------------------+           PSV cm/sEDV cmsDescribe        Arm Pressure (mmHG) +----------+--------+-------+----------------+-------------------+ [id number]             Multiphasic, WNL                    +----------+--------+-------+----------------+-------------------+ +---------+--------+--+--------+--+---------+ VertebralPSV cm/s38EDV cm/s12Antegrade +---------+--------+--+--------+--+---------+  Left Carotid Findings: +----------+--------+--------+--------+------------------+------------------+           PSV cm/sEDV cm/sStenosisPlaque DescriptionComments           +----------+--------+--------+--------+------------------+------------------+ CCA Prox  82      15                                intimal thickening +----------+--------+--------+--------+------------------+------------------+ CCA Distal71      20              hypoechoic        intimal thickening +----------+--------+--------+--------+------------------+------------------+ ICA Prox  58      13              heterogenous                         +----------+--------+--------+--------+------------------+------------------+ ICA Distal64      20                                                   +----------+--------+--------+--------+------------------+------------------+  ECA       116     17                                                   +----------+--------+--------+--------+------------------+------------------+  +----------+--------+--------+----------------+-------------------+           PSV cm/sEDV cm/sDescribe        Arm Pressure (mmHG) +----------+--------+--------+----------------+-------------------+ [id number]             Multiphasic, WNL                    +----------+--------+--------+----------------+-------------------+ +---------+--------+--------+--------------+ VertebralPSV cm/sEDV cm/sNot identified +---------+--------+--------+--------------+   Summary: Right Carotid: The extracranial vessels were near-normal with only minimal wall                thickening or plaque. Left Carotid: The extracranial vessels were near-normal with only minimal wall               thickening or plaque. Vertebrals:  Right vertebral artery demonstrates antegrade flow. Left vertebral              artery was not visualized. Subclavians: Normal flow hemodynamics were seen in bilateral subclavian              arteries. *See table(s) above for measurements and observations.  Electronically signed by Delia Heady MD on 03/21/2021 at 5:52:45 PM.    Final      ASSESSMENT/PLAN:  This is a very pleasant 64 year old African-American male diagnosed with stage IV (Tx, N2, M1c) non-small cell lung cancer, adenocarcinoma.  He presented with a solitary brain metastasis in addition to right hilar and mediastinal lymphadenopathy.  He was diagnosed in August 2021.     The patient is status post right craniotomy with resection of the solitary brain metastasis with SRS under the care of Dr. Mitzi Hansen and Dr. Franky Macho.    He completed weekly concurrent chemoradiation with carboplatin for an AUC of 2 and paclitaxel 45 mg per metered square.  He is status post 5 cycles.  He tolerated it well except for some dysphagia/odynophagia.   The patient then had evidence of new brain metastases.  He is status post SRS to the new lesions on 04/22/2020 under the care of Dr. Mitzi Hansen.   The patient is currently undergoing immunotherapy with  Libtayo IV every 3 weeks.  He is status post 15 cycles and tolerated it well except for arthralgias.   I will arrange for restaging CT scan of the chest, abdomen, pelvis prior to starting his neck cycle of treatment.  We will see him back for follow-up visit in 3 weeks for evaluation and to review his scan results before starting cycle #17.  We will continue to follow closely with neurology and radiation oncology regarding his history of metastatic disease to the brain.  The patient was advised to call immediately if he has any concerning symptoms in the interval. The patient voices understanding of current disease status and treatment options and is in agreement with the current care plan. All questions were answered. The patient knows to call the clinic with any problems, questions or concerns. We can certainly see the patient much sooner if necessary     Orders Placed This Encounter  Procedures   CT Chest W Contrast    Standing Status:   Future    Standing Expiration Date:  03/27/2022    Order Specific Question:   If indicated for the ordered procedure, I authorize the administration of contrast media per Radiology protocol    Answer:   Yes    Order Specific Question:   Preferred imaging location?    Answer:   [facility]   CT Abdomen Pelvis W Contrast    Standing Status:   Future    Standing Expiration Date:   03/27/2022    Order Specific Question:   If indicated for the ordered procedure, I authorize the administration of contrast media per Radiology protocol    Answer:   Yes    Order Specific Question:   Preferred imaging location?    Answer:   [facility]    Order Specific Question:   Is Oral Contrast requested for this exam?    Answer:   Yes, Per Radiology protocol       The total time spent in the appointment was 20-29 minutes.   Jerrianne Hartin L Oaklen Thiam, PA-C 03/27/21

## 2021-03-22 ENCOUNTER — Telehealth: Payer: Self-pay | Admitting: *Deleted

## 2021-03-22 NOTE — Telephone Encounter (Signed)
I spoke to patient and provided him with the results.

## 2021-03-22 NOTE — Telephone Encounter (Signed)
-----   Message from Marcial Pacas, MD sent at 03/21/2021  4:48 PM EST ----- Please call patient, ultrasound of carotid arteries showed no significant abnormalities.

## 2021-03-23 ENCOUNTER — Other Ambulatory Visit: Payer: Self-pay

## 2021-03-23 ENCOUNTER — Encounter: Payer: Self-pay | Admitting: Internal Medicine

## 2021-03-23 ENCOUNTER — Ambulatory Visit (INDEPENDENT_AMBULATORY_CARE_PROVIDER_SITE_OTHER): Payer: Medicare HMO | Admitting: Internal Medicine

## 2021-03-23 ENCOUNTER — Telehealth: Payer: Self-pay

## 2021-03-23 VITALS — BP 137/78 | HR 89 | Temp 98.3°F | Ht 73.0 in | Wt 200.0 lb

## 2021-03-23 DIAGNOSIS — T380X5A Adverse effect of glucocorticoids and synthetic analogues, initial encounter: Secondary | ICD-10-CM | POA: Diagnosis not present

## 2021-03-23 DIAGNOSIS — I10 Essential (primary) hypertension: Secondary | ICD-10-CM

## 2021-03-23 DIAGNOSIS — R739 Hyperglycemia, unspecified: Secondary | ICD-10-CM | POA: Diagnosis not present

## 2021-03-23 DIAGNOSIS — M2550 Pain in unspecified joint: Secondary | ICD-10-CM | POA: Diagnosis not present

## 2021-03-23 LAB — POCT GLYCOSYLATED HEMOGLOBIN (HGB A1C): Hemoglobin A1C: 4.7 % (ref 4.0–5.6)

## 2021-03-23 LAB — GLUCOSE, CAPILLARY: Glucose-Capillary: 90 mg/dL (ref 70–99)

## 2021-03-23 MED ORDER — MELOXICAM 7.5 MG PO TABS: 7.5000 mg | ORAL_TABLET | Freq: Every day | ORAL | 0 refills | Status: DC

## 2021-03-23 NOTE — Telephone Encounter (Signed)
Walk-in: Pt presents to clinic asking for an appt.  Pt states he is just wanting an appt for a routine physical.  There is an opening this afternoon on the yellow team, Dr. Lorin Glass at 1330 , patient given this appt and is appreciative. SChaplin, RN,BSN

## 2021-03-23 NOTE — Assessment & Plan Note (Signed)
BP initially elevated to 157/89.  Repeat BP of 137/78 today.  Patient remains asymptomatic at this time.  P: Continue losartan 25 mg daily

## 2021-03-23 NOTE — Assessment & Plan Note (Signed)
Patient does not have a history of diabetes, however he does have a history of A1c 7.6, which sharply decreased to 4.4 afterwards (2021).  He was on steroids at that time, and there is suspicion that patient's elevated A1c was due to steroid-induced hyperglycemia.  Today, we will recheck his A1c and contact the patient if this result is abnormal.

## 2021-03-23 NOTE — Patient Instructions (Addendum)
Thank you, Mr.Ramere Janann Colonel. for allowing [place] to provide your care today. Today we discussed your joint pain.    Today, we have prescribed a medicine called meloxicam. I have given you a 3 month dose. If you do not notice improvement in pain over the next several weeks, please contact us. Otherwise, you can follow-up in 6 months if needed.  As a reminder, you can continue to take acetaminophen with this new medication, but please do not take ibuprofen with meloxicam.  I have ordered the following labs for you:  Lab Orders         POC Hbg A1C       I have ordered the following medication/changed the following medications:   Stop the following medications: There are no discontinued medications.   Start the following medications: Meds ordered this encounter  Medications   meloxicam (MOBIC) 7.5 MG tablet    Sig: Take 1 tablet (7.5 mg total) by mouth daily.    Dispense:  90 tablet    Refill:  0      Follow up: 6 months if needed. If you would like a follow up call the number below.   Should you have any questions or concerns please call the internal medicine clinic at [phone].

## 2021-03-23 NOTE — Progress Notes (Signed)
   CC: joint pain  HPI:  [name] is a 65 y.o. with past medical history as noted below who presents to the clinic today for follow-up of joint pain. Please see problem-based list for further details, assessments, and plans.  Past Medical History:  Diagnosis Date   Brain mass    Bursitis of right hip    Chest pain 08/12/2018   Chronic cough    Dependence on nicotine from cigarettes    Diabetes mellitus without complication ([place])    Essential hypertension 07/28/2018   nscl ca dx'd 12/2019   Seizures ([place])    Viral illness 03/23/2019   Review of Systems:  Review of Systems  Constitutional: Negative.   HENT: Negative.    Eyes: Negative.   Respiratory: Negative.    Cardiovascular: Negative.   Gastrointestinal: Negative.   Genitourinary: Negative.   Musculoskeletal:  Positive for joint pain.  Skin: Negative.   Neurological: Negative.   Endo/Heme/Allergies: Negative.   Psychiatric/Behavioral: Negative.     Physical Exam:  Vitals:   03/23/21 1335 03/23/21 1428  BP: (!) 157/89 137/78  Pulse: 93 89  Temp: 98.3 F (36.8 C)   TempSrc: Oral   SpO2: 100%   Weight: 200 lb (90.7 kg)   Height: 6\' 1"  (1.854 m)    Physical Exam Constitutional:      General: He is not in acute distress.    Appearance: Normal appearance.  HENT:     Head: Normocephalic and atraumatic.  Eyes:     Extraocular Movements: Extraocular movements intact.     Pupils: Pupils are equal, round, and reactive to light.  Cardiovascular:     Rate and Rhythm: Normal rate and regular rhythm.     Heart sounds: No murmur heard.   No friction rub. No gallop.  Pulmonary:     Effort: Pulmonary effort is normal. No respiratory distress.     Breath sounds: Normal breath sounds. No wheezing, rhonchi or rales.  Abdominal:     General: Abdomen is flat. There is no distension.  Musculoskeletal:        General: No swelling.     Comments: BUE: Shoulder abduction is nearly full, slightly limited by  pain Bilateral knees: No TTP. No effusions. Pain in knees upon standing  Skin:    General: Skin is warm and dry.  Neurological:     General: No focal deficit present.     Mental Status: He is alert and oriented to person, place, and time. Mental status is at baseline.  Psychiatric:        Mood and Affect: Mood normal.        Behavior: Behavior normal.     Assessment & Plan:   See Encounters Tab for problem based charting.  Patient seen with Dr. Heber [place]

## 2021-03-23 NOTE — Assessment & Plan Note (Addendum)
The patient is here today for follow-up of his joint pain.  The patient was last seen in May of this year, at which time overall picture consistent with symmetric paraneoplastic arthralgia. He has tried ibuprofen and Voltaren gel, and these have not helped to alleviate the pain.  He was also given Norco in September, and this has not helped his pain either.  He states that he is having the most pain in his bilateral knees, however he has pain in other joints such as his shoulders and elbows.    On exam, there is no tenderness to palpation of either knee.  He is able to abduct his shoulders, however this is slightly limited due to pain.  P: Prescribed daily meloxicam today.  We have instructed the patient to contact us if he does not get adequate pain relief over the next few weeks.  Follow-up as needed.

## 2021-03-24 ENCOUNTER — Telehealth: Payer: Medicare HMO | Admitting: Emergency Medicine

## 2021-03-24 DIAGNOSIS — B9689 Other specified bacterial agents as the cause of diseases classified elsewhere: Secondary | ICD-10-CM

## 2021-03-24 DIAGNOSIS — J019 Acute sinusitis, unspecified: Secondary | ICD-10-CM | POA: Diagnosis not present

## 2021-03-24 MED ORDER — DOXYCYCLINE HYCLATE 100 MG PO TABS: 100.0000 mg | ORAL_TABLET | Freq: Two times a day (BID) | ORAL | 0 refills | Status: DC

## 2021-03-24 NOTE — Progress Notes (Signed)
Virtual Visit Consent   [name]., you are scheduled for a virtual visit with a [facility] provider today.     Just as with appointments in the office, your consent must be obtained to participate.  Your consent will be active for this visit and any virtual visit you may have with one of our providers in the next 365 days.     If you have a MyChart account, a copy of this consent can be sent to you electronically.  All virtual visits are billed to your insurance company just like a traditional visit in the office.    As this is a virtual visit, video technology does not allow for your provider to perform a traditional examination.  This may limit your provider's ability to fully assess your condition.  If your provider identifies any concerns that need to be evaluated in person or the need to arrange testing (such as labs, EKG, etc.), we will make arrangements to do so.     Although advances in technology are sophisticated, we cannot ensure that it will always work on either your end or our end.  If the connection with a video visit is poor, the visit may have to be switched to a telephone visit.  With either a video or telephone visit, we are not always able to ensure that we have a secure connection.     I need to obtain your verbal consent now.   Are you willing to proceed with your visit today?    [name]. has provided verbal consent on 03/24/2021 for a virtual visit (video or telephone).   Carvel Getting, NP   Date: 03/24/2021 4:55 PM   Virtual Visit via Video Note   I, Carvel Getting, connected with  Lamone Ferrelli.  ([id number], 1955-10-14) on 03/24/21 at  4:45 PM EST by a video-enabled telemedicine application and verified that I am speaking with the correct person using two identifiers.  Location: Patient: Virtual Visit Location Patient: Other: parked car Provider: Virtual Visit Location Provider: Home Office   I discussed the limitations of evaluation and  management by telemedicine and the availability of in person appointments. The patient expressed understanding and agreed to proceed.    History of Present Illness: Jared Shea. is a 65 y.o. who identifies as a male who was assigned male at birth, and is being seen today for productive cough with yellow sputum.  Patient reports he has been sick for about 3 days.  It started with a mild sore throat and then he developed a cough.  Denies fever or chills.  Does report he is losing his voice and he has a lot of postnasal drainage.  He does have some nasal drainage that is clear.  He is coughing up yellow sputum; it started as clear sputum but is switched to yellow and thicker in the last day.  He has been treating symptoms with home remedies.  He has had a little shortness of breath and wheezing.  He used to smoke, quit about a year ago.  Pertinent health history includes stage IV lung cancer with brain metastasis, memory impairment.  Present on the video call is a family member who contributed to the history.  HPI: HPI  Problems:  Patient Active Problem List   Diagnosis Date Noted   Vision changes 03/14/2021   Memory loss 03/14/2021   Port-A-Cath in place 07/05/2020   Frequent headaches 06/27/2020   Polyarthralgia 06/15/2020   Headache  05/09/2020   Encounter for antineoplastic immunotherapy 04/18/2020   Goals of care, counseling/discussion 04/18/2020   Hypokalemia 03/07/2020   Hypotension 03/07/2020   Adenocarcinoma, metastatic ([facility]) 01/07/2020   Adjustment disorder with depressed mood 01/06/2020   Steroid-induced hyperglycemia 01/04/2020   Thyroid nodule 01/04/2020   Non-small cell carcinoma of lung, stage 4 w/ mets to brain ([facility]) 12/31/2019   Partial symptomatic epilepsy with complex partial seizures, not intractable, without status epilepticus ([facility]) 12/23/2019   Brain metastases ([facility]) 12/23/2019   Focal seizures ([facility]) 12/09/2019   Tobacco use disorder 08/12/2018   Essential  hypertension 07/28/2018    Allergies:  Allergies  Allergen Reactions   Penicillins Rash    Did it involve swelling of the face/tongue/throat, SOB, or low BP? N Did it involve sudden or severe rash/hives, skin peeling, or any reaction on the inside of your mouth or nose? Y Did you need to seek medical attention at a hospital or doctor's office? Y When did it last happen?    childhood   If all above answers are "NO", may proceed with cephalosporin use.    Medications:  Current Outpatient Medications:    doxycycline (VIBRA-TABS) 100 MG tablet, Take 1 tablet (100 mg total) by mouth 2 (two) times daily., Disp: 20 tablet, Rfl: 0   HYDROcodone-acetaminophen (NORCO) 5-325 MG tablet, Take 1 tablet by mouth every 6 (six) hours as needed for moderate pain or severe pain., Disp: 20 tablet, Rfl: 0   levETIRAcetam (KEPPRA) 750 MG tablet, Take 1 tablet (750 mg total) by mouth 2 (two) times daily., Disp: 180 tablet, Rfl: 4   losartan (COZAAR) 25 MG tablet, Take 1 tablet (25 mg total) by mouth daily., Disp: 30 tablet, Rfl: 11   Melatonin 10 MG TABS, Take 10 mg by mouth at bedtime as needed (sleep)., Disp: , Rfl:    meloxicam (MOBIC) 7.5 MG tablet, Take 1 tablet (7.5 mg total) by mouth daily., Disp: 90 tablet, Rfl: 0  Observations/Objective: Patient is well-developed, well-nourished in no acute distress.  Resting comfortably  in passenger seat of car.  Head is normocephalic, atraumatic.  No labored breathing.  Speech is clear and coherent with logical content.  Patient is alert and oriented at baseline.  Voice does sound hoarse.   Assessment and Plan: 1. Acute bacterial sinusitis  I suspect patient has a bacterial sinus infection causing hoarse voice, sore throat and postnasal drainage that he is then coughing up.  We will treat for an acute bacterial sinus infection.  Reviewed reasons for seeking a higher level of care with patient and the family member.  Follow Up Instructions: I discussed the  assessment and treatment plan with the patient. The patient was provided an opportunity to ask questions and all were answered. The patient agreed with the plan and demonstrated an understanding of the instructions.  A copy of instructions were sent to the patient via MyChart unless otherwise noted below.   The patient was advised to call back or seek an in-person evaluation if the symptoms worsen or if the condition fails to improve as anticipated.  Time:  I spent 10 minutes with the patient via telehealth technology discussing the above problems/concerns.    Carvel Getting, NP

## 2021-03-24 NOTE — Patient Instructions (Signed)
  [name]., thank you for joining Carvel Getting, NP for today's virtual visit.  While this provider is not your primary care provider (PCP), if your PCP is located in our provider database this encounter information will be shared with them immediately following your visit.  Consent: (Patient) [name]. provided verbal consent for this virtual visit at the beginning of the encounter.  Current Medications:  Current Outpatient Medications:    doxycycline (VIBRA-TABS) 100 MG tablet, Take 1 tablet (100 mg total) by mouth 2 (two) times daily., Disp: 20 tablet, Rfl: 0   HYDROcodone-acetaminophen (NORCO) 5-325 MG tablet, Take 1 tablet by mouth every 6 (six) hours as needed for moderate pain or severe pain., Disp: 20 tablet, Rfl: 0   levETIRAcetam (KEPPRA) 750 MG tablet, Take 1 tablet (750 mg total) by mouth 2 (two) times daily., Disp: 180 tablet, Rfl: 4   losartan (COZAAR) 25 MG tablet, Take 1 tablet (25 mg total) by mouth daily., Disp: 30 tablet, Rfl: 11   Melatonin 10 MG TABS, Take 10 mg by mouth at bedtime as needed (sleep)., Disp: , Rfl:    meloxicam (MOBIC) 7.5 MG tablet, Take 1 tablet (7.5 mg total) by mouth daily., Disp: 90 tablet, Rfl: 0   Medications ordered in this encounter:  Meds ordered this encounter  Medications   doxycycline (VIBRA-TABS) 100 MG tablet    Sig: Take 1 tablet (100 mg total) by mouth 2 (two) times daily.    Dispense:  20 tablet    Refill:  0     *If you need refills on other medications prior to your next appointment, please contact your pharmacy*  Follow-Up: Call back or seek an in-person evaluation if the symptoms worsen or if the condition fails to improve as anticipated.  Other Instructions If this treatment plan does not help improve your symptoms or if you feel like you are getting worse with worsening shortness of breath and wheezing, you will need to be seen in person for help.   If you have been instructed to have an in-person evaluation  today at a local Urgent Care facility, please use the link below. It will take you to a list of all of our available [facility], including address, phone number and hours of operation. Please do not delay care.  [facility]  If you or a family member do not have a primary care provider, use the link below to schedule a visit and establish care. When you choose a [facility] primary care physician or advanced practice provider, you gain a long-term partner in health. Find a Primary Care Provider  Learn more about [facility]'s in-office and virtual care options: [facility]

## 2021-03-27 ENCOUNTER — Inpatient Hospital Stay: Payer: Medicare HMO

## 2021-03-27 ENCOUNTER — Inpatient Hospital Stay (HOSPITAL_BASED_OUTPATIENT_CLINIC_OR_DEPARTMENT_OTHER): Payer: Medicare HMO | Admitting: Physician Assistant

## 2021-03-27 ENCOUNTER — Encounter: Payer: Self-pay | Admitting: Physician Assistant

## 2021-03-27 ENCOUNTER — Other Ambulatory Visit: Payer: Self-pay

## 2021-03-27 ENCOUNTER — Inpatient Hospital Stay: Payer: Medicare HMO | Attending: Internal Medicine

## 2021-03-27 ENCOUNTER — Other Ambulatory Visit: Payer: Self-pay | Admitting: Physician Assistant

## 2021-03-27 VITALS — BP 128/77 | HR 89 | Temp 98.2°F | Resp 17 | Ht 73.0 in | Wt 195.9 lb

## 2021-03-27 DIAGNOSIS — C3490 Malignant neoplasm of unspecified part of unspecified bronchus or lung: Secondary | ICD-10-CM | POA: Diagnosis not present

## 2021-03-27 DIAGNOSIS — M791 Myalgia, unspecified site: Secondary | ICD-10-CM | POA: Diagnosis not present

## 2021-03-27 DIAGNOSIS — I10 Essential (primary) hypertension: Secondary | ICD-10-CM | POA: Insufficient documentation

## 2021-03-27 DIAGNOSIS — Z95828 Presence of other vascular implants and grafts: Secondary | ICD-10-CM

## 2021-03-27 DIAGNOSIS — R5383 Other fatigue: Secondary | ICD-10-CM | POA: Insufficient documentation

## 2021-03-27 DIAGNOSIS — Z5112 Encounter for antineoplastic immunotherapy: Secondary | ICD-10-CM | POA: Diagnosis present

## 2021-03-27 DIAGNOSIS — C799 Secondary malignant neoplasm of unspecified site: Secondary | ICD-10-CM

## 2021-03-27 DIAGNOSIS — R634 Abnormal weight loss: Secondary | ICD-10-CM | POA: Insufficient documentation

## 2021-03-27 DIAGNOSIS — C7931 Secondary malignant neoplasm of brain: Secondary | ICD-10-CM | POA: Diagnosis not present

## 2021-03-27 DIAGNOSIS — M7071 Other bursitis of hip, right hip: Secondary | ICD-10-CM | POA: Insufficient documentation

## 2021-03-27 DIAGNOSIS — F17210 Nicotine dependence, cigarettes, uncomplicated: Secondary | ICD-10-CM | POA: Insufficient documentation

## 2021-03-27 DIAGNOSIS — R0602 Shortness of breath: Secondary | ICD-10-CM | POA: Insufficient documentation

## 2021-03-27 DIAGNOSIS — Z79899 Other long term (current) drug therapy: Secondary | ICD-10-CM | POA: Diagnosis not present

## 2021-03-27 DIAGNOSIS — C3401 Malignant neoplasm of right main bronchus: Secondary | ICD-10-CM | POA: Insufficient documentation

## 2021-03-27 DIAGNOSIS — R413 Other amnesia: Secondary | ICD-10-CM

## 2021-03-27 DIAGNOSIS — E119 Type 2 diabetes mellitus without complications: Secondary | ICD-10-CM | POA: Diagnosis not present

## 2021-03-27 DIAGNOSIS — C3491 Malignant neoplasm of unspecified part of right bronchus or lung: Secondary | ICD-10-CM

## 2021-03-27 LAB — CBC WITH DIFFERENTIAL (CANCER CENTER ONLY)
Abs Immature Granulocytes: 0.04 10*3/uL (ref 0.00–0.07)
Basophils Absolute: 0 10*3/uL (ref 0.0–0.1)
Basophils Relative: 1 %
Eosinophils Absolute: 0.2 10*3/uL (ref 0.0–0.5)
Eosinophils Relative: 3 %
HCT: 37.8 % — ABNORMAL LOW (ref 39.0–52.0)
Hemoglobin: 12.9 g/dL — ABNORMAL LOW (ref 13.0–17.0)
Immature Granulocytes: 1 %
Lymphocytes Relative: 25 %
Lymphs Abs: 1.7 10*3/uL (ref 0.7–4.0)
MCH: 30.3 pg (ref 26.0–34.0)
MCHC: 34.1 g/dL (ref 30.0–36.0)
MCV: 88.7 fL (ref 80.0–100.0)
Monocytes Absolute: 0.6 10*3/uL (ref 0.1–1.0)
Monocytes Relative: 8 %
Neutro Abs: 4.3 10*3/uL (ref 1.7–7.7)
Neutrophils Relative %: 62 %
Platelet Count: 330 10*3/uL (ref 150–400)
RBC: 4.26 MIL/uL (ref 4.22–5.81)
RDW: 13.8 % (ref 11.5–15.5)
WBC Count: 6.8 10*3/uL (ref 4.0–10.5)
nRBC: 0 % (ref 0.0–0.2)

## 2021-03-27 LAB — CMP (CANCER CENTER ONLY)
ALT: 31 U/L (ref 0–44)
AST: 25 U/L (ref 15–41)
Albumin: 4.3 g/dL (ref 3.5–5.0)
Alkaline Phosphatase: 77 U/L (ref 38–126)
Anion gap: 8 (ref 5–15)
BUN: 16 mg/dL (ref 8–23)
CO2: 24 mmol/L (ref 22–32)
Calcium: 9.2 mg/dL (ref 8.9–10.3)
Chloride: 107 mmol/L (ref 98–111)
Creatinine: 0.83 mg/dL (ref 0.61–1.24)
GFR, Estimated: >60 mL/min (ref >60)
Glucose, Bld: 118 mg/dL — ABNORMAL HIGH (ref 70–99)
Potassium: 4.1 mmol/L (ref 3.5–5.1)
Sodium: 139 mmol/L (ref 135–145)
Total Bilirubin: 0.7 mg/dL (ref 0.3–1.2)
Total Protein: 8.1 g/dL (ref 6.5–8.1)

## 2021-03-27 LAB — TSH: TSH: 0.772 u[IU]/mL (ref 0.320–4.118)

## 2021-03-27 MED ORDER — SODIUM CHLORIDE 0.9 % IV SOLN
350.0000 mg | Freq: Once | INTRAVENOUS | Status: AC
  Administered 2021-03-27: 350 mg via INTRAVENOUS
  Filled 2021-03-27: qty 7

## 2021-03-27 MED ORDER — SODIUM CHLORIDE 0.9 % IV SOLN: Freq: Once | INTRAVENOUS | Status: AC

## 2021-03-27 MED ORDER — SODIUM CHLORIDE 0.9% FLUSH
10.0000 mL | INTRAVENOUS | Status: DC | PRN
  Administered 2021-03-27: 10 mL

## 2021-03-27 MED ORDER — HEPARIN SOD (PORK) LOCK FLUSH 100 UNIT/ML IV SOLN
500.0000 [IU] | Freq: Once | INTRAVENOUS | Status: AC | PRN
  Administered 2021-03-27: 500 [IU]

## 2021-03-27 MED ORDER — SODIUM CHLORIDE 0.9% FLUSH
10.0000 mL | Freq: Once | INTRAVENOUS | Status: AC
  Administered 2021-03-27: 10 mL

## 2021-03-27 NOTE — Patient Instructions (Signed)
[facility]   Discharge Instructions: Thank you for choosing [facility] to provide your oncology and hematology care.   If you have a lab appointment with the [facility], please go directly to the [facility] and check in at the registration area.   Wear comfortable clothing and clothing appropriate for easy access to any Portacath or PICC line.   We strive to give you quality time with your provider. You may need to reschedule your appointment if you arrive late (15 or more minutes).  Arriving late affects you and other patients whose appointments are after yours.  Also, if you miss three or more appointments without notifying the office, you may be dismissed from the clinic at the provider's discretion.      For prescription refill requests, have your pharmacy contact our office and allow 72 hours for refills to be completed.    Today you received the following chemotherapy and/or immunotherapy agents: [id number]      To help prevent nausea and vomiting after your treatment, we encourage you to take your nausea medication as directed.  BELOW ARE SYMPTOMS THAT SHOULD BE REPORTED IMMEDIATELY: *FEVER GREATER THAN 100.4 F (38 C) OR HIGHER *CHILLS OR SWEATING *NAUSEA AND VOMITING THAT IS NOT CONTROLLED WITH YOUR NAUSEA MEDICATION *UNUSUAL SHORTNESS OF BREATH *UNUSUAL BRUISING OR BLEEDING *URINARY PROBLEMS (pain or burning when urinating, or frequent urination) *BOWEL PROBLEMS (unusual diarrhea, constipation, pain near the anus) TENDERNESS IN MOUTH AND THROAT WITH OR WITHOUT PRESENCE OF ULCERS (sore throat, sores in mouth, or a toothache) UNUSUAL RASH, SWELLING OR PAIN  UNUSUAL VAGINAL DISCHARGE OR ITCHING   Items with * indicate a potential emergency and should be followed up as soon as possible or go to the Emergency Department if any problems should occur.  Please show the CHEMOTHERAPY ALERT CARD or IMMUNOTHERAPY ALERT CARD at  check-in to the Emergency Department and triage nurse.  Should you have questions after your visit or need to cancel or reschedule your appointment, please contact [facility]  Dept: [phone]  and follow the prompts.  Office hours are 8:00 a.m. to 4:30 p.m. Monday - Friday. Please note that voicemails left after 4:00 p.m. may not be returned until the following business day.  We are closed weekends and major holidays. You have access to a nurse at all times for urgent questions. Please call the main number to the clinic Dept: [phone] and follow the prompts.   For any non-urgent questions, you may also contact your provider using MyChart. We now offer e-Visits for anyone 18 and older to request care online for non-urgent symptoms. For details visit [email or web address].   Also download the MyChart app! Go to the app store, search "MyChart", open the app, select [facility], and log in with your MyChart username and password.  Due to Covid, a mask is required upon entering the hospital/clinic. If you do not have a mask, one will be given to you upon arrival. For doctor visits, patients may have 1 support person aged 62 or older with them. For treatment visits, patients cannot have anyone with them due to current Covid guidelines and our immunocompromised population.

## 2021-03-28 NOTE — Progress Notes (Signed)
Internal Medicine Clinic Attending ? ?I saw and evaluated the patient.  I personally confirmed the key portions of the history and exam documented by Dr. Bonanno and I reviewed pertinent patient test results.  The assessment, diagnosis, and plan were formulated together and I agree with the documentation in the resident?s note. ? ?

## 2021-04-03 ENCOUNTER — Other Ambulatory Visit: Payer: Self-pay

## 2021-04-03 ENCOUNTER — Ambulatory Visit (INDEPENDENT_AMBULATORY_CARE_PROVIDER_SITE_OTHER): Payer: Medicare HMO | Admitting: Internal Medicine

## 2021-04-03 ENCOUNTER — Encounter: Payer: Self-pay | Admitting: Internal Medicine

## 2021-04-03 ENCOUNTER — Encounter: Payer: Self-pay | Admitting: Dietician

## 2021-04-03 ENCOUNTER — Other Ambulatory Visit (HOSPITAL_COMMUNITY)
Admission: RE | Admit: 2021-04-03 | Discharge: 2021-04-03 | Disposition: A | Discharge status: Home / Self Care | Payer: Medicare HMO | Source: Ambulatory Visit | Attending: Internal Medicine | Admitting: Internal Medicine

## 2021-04-03 VITALS — BP 152/87 | HR 92 | Temp 97.5°F | Ht 73.0 in | Wt 199.5 lb

## 2021-04-03 DIAGNOSIS — R300 Dysuria: Secondary | ICD-10-CM | POA: Insufficient documentation

## 2021-04-03 HISTORY — DX: Dysuria: R30.0

## 2021-04-03 LAB — POCT URINALYSIS DIPSTICK
Bilirubin, UA: NEGATIVE
Blood, UA: NEGATIVE
Glucose, UA: NEGATIVE
Ketones, UA: NEGATIVE
Leukocytes, UA: NEGATIVE
Nitrite, UA: NEGATIVE
Protein, UA: NEGATIVE
Spec Grav, UA: >=1.03 — AB (ref 1.010–1.025)
Urobilinogen, UA: 1 E.U./dL
pH, UA: 5.5 (ref 5.0–8.0)

## 2021-04-03 NOTE — Assessment & Plan Note (Signed)
Pt reports 2 week history of burning with urination, but no penile discharge or testicular pain  Sexual history significant for 2 partners in the last year with inconsistent barrier protection. Given immunocompromised status in the setting of Stage IV lung cancer, will administer urine GC, HIV, and RPR testing. Additionally, will collect UA sample for possible UTI or prostatitis.  - Urine GC - RPR - HIV - UA

## 2021-04-03 NOTE — Patient Instructions (Signed)
Thank you, Jared Shea. for allowing [place] to provide your care today. Today we discussed:  - Burning with urination: We are getting a urine sample and some blood work to test for potential causes. We will call you with the results.  I have ordered the following labs for you:   Lab Orders         GC probe amplification, urine         HIV antibody (with reflex)         RPR         POCT Urinalysis Dipstick (89381)       Follow up: 6 months   Remember: Should you have any questions or concerns please call the internal medicine clinic at [phone].

## 2021-04-03 NOTE — Progress Notes (Signed)
Attestation for Student Documentation:  I personally was present and performed or re-performed the history, physical exam and medical decision-making activities of this service and have verified that the service and findings are accurately documented in the student's note.  Jose Persia, MD 04/03/2021, 3:20 PM

## 2021-04-03 NOTE — Progress Notes (Signed)
  Subjective:     Patient ID: Jared Shea., male   DOB: 07-28-1955, 64 y.o.   MRN: [id number]  Jared Shea is a 65 y/o presenting to clinic for STI testing. He reports experiencing some burning with urination for the past 2 weeks or so. He denies any penile discharge or testicular pain. His sexual history is notable for 2 partners in the past year. He reports using barrier protection with his most recent partner, but did not use protection with the previous partner. He reports previously testing positive for chlamydia in high school, but this was treated without any complications.    Review of Systems  Genitourinary:  Positive for dysuria. Negative for penile discharge and testicular pain.  Musculoskeletal:  Positive for arthralgias.      Objective:   Physical Exam Constitutional:      Appearance: Normal appearance.  HENT:     Head: Normocephalic and atraumatic.  Cardiovascular:     Rate and Rhythm: Normal rate and regular rhythm.  Pulmonary:     Effort: Pulmonary effort is normal.     Breath sounds: Normal breath sounds.  Abdominal:     General: Bowel sounds are normal.     Tenderness: There is no abdominal tenderness.  Musculoskeletal:     Right lower leg: No edema.     Left lower leg: No edema.  Neurological:     Mental Status: He is alert.       Assessment & Plan:    See problem-based charting.

## 2021-04-04 LAB — URINE CYTOLOGY ANCILLARY ONLY
Chlamydia: NEGATIVE
Comment: NEGATIVE
Comment: NEGATIVE
Comment: NORMAL
Neisseria Gonorrhea: NEGATIVE
Trichomonas: NEGATIVE

## 2021-04-04 LAB — HIV ANTIBODY (ROUTINE TESTING W REFLEX): HIV Screen 4th Generation wRfx: NONREACTIVE

## 2021-04-04 LAB — RPR: RPR Ser Ql: NONREACTIVE

## 2021-04-04 NOTE — Progress Notes (Signed)
Internal Medicine Clinic Attending  Case discussed with Dr. Basaraba  At the time of the visit.  We reviewed the resident's history and exam and pertinent patient test results.  I agree with the assessment, diagnosis, and plan of care documented in the resident's note.  

## 2021-04-04 NOTE — Progress Notes (Signed)
UA with no evidence of hematuria or pyuria concerning for infection. STI testing negative. Attempted to contact Mr. Szumski regarding results. Left a VM asking that he call back to discuss results.

## 2021-04-10 ENCOUNTER — Telehealth: Payer: Self-pay

## 2021-04-10 ENCOUNTER — Encounter: Payer: Self-pay | Admitting: Internal Medicine

## 2021-04-10 NOTE — Telephone Encounter (Signed)
Requesting test results, please call pt back.

## 2021-04-10 NOTE — Telephone Encounter (Signed)
Please call pt back for lab results @ [phone].

## 2021-04-11 ENCOUNTER — Ambulatory Visit (HOSPITAL_COMMUNITY)
Admission: RE | Admit: 2021-04-11 | Discharge: 2021-04-11 | Disposition: A | Discharge status: Home / Self Care | Payer: Medicare HMO | Source: Ambulatory Visit | Attending: Physician Assistant | Admitting: Physician Assistant

## 2021-04-11 ENCOUNTER — Other Ambulatory Visit: Payer: Self-pay

## 2021-04-11 DIAGNOSIS — C3490 Malignant neoplasm of unspecified part of unspecified bronchus or lung: Secondary | ICD-10-CM | POA: Diagnosis present

## 2021-04-11 MED ORDER — IOHEXOL 350 MG/ML SOLN
80.0000 mL | Freq: Once | INTRAVENOUS | Status: AC | PRN
  Administered 2021-04-11: 80 mL via INTRAVENOUS

## 2021-04-11 MED ORDER — HEPARIN SOD (PORK) LOCK FLUSH 100 UNIT/ML IV SOLN
500.0000 [IU] | Freq: Once | INTRAVENOUS | Status: AC
  Administered 2021-04-11: 500 [IU] via INTRAVENOUS

## 2021-04-17 ENCOUNTER — Encounter: Payer: Self-pay | Admitting: Internal Medicine

## 2021-04-17 ENCOUNTER — Other Ambulatory Visit: Payer: Self-pay

## 2021-04-17 ENCOUNTER — Inpatient Hospital Stay: Payer: Medicare HMO | Attending: Internal Medicine

## 2021-04-17 ENCOUNTER — Inpatient Hospital Stay: Payer: Medicare HMO

## 2021-04-17 ENCOUNTER — Other Ambulatory Visit: Payer: Self-pay | Admitting: Internal Medicine

## 2021-04-17 ENCOUNTER — Inpatient Hospital Stay (HOSPITAL_BASED_OUTPATIENT_CLINIC_OR_DEPARTMENT_OTHER): Payer: Medicare HMO | Admitting: Internal Medicine

## 2021-04-17 VITALS — BP 138/74 | HR 93 | Temp 97.3°F | Resp 18 | Ht 73.0 in | Wt 195.4 lb

## 2021-04-17 DIAGNOSIS — R1310 Dysphagia, unspecified: Secondary | ICD-10-CM | POA: Diagnosis not present

## 2021-04-17 DIAGNOSIS — Z5112 Encounter for antineoplastic immunotherapy: Secondary | ICD-10-CM | POA: Diagnosis present

## 2021-04-17 DIAGNOSIS — C7931 Secondary malignant neoplasm of brain: Secondary | ICD-10-CM

## 2021-04-17 DIAGNOSIS — C3491 Malignant neoplasm of unspecified part of right bronchus or lung: Secondary | ICD-10-CM

## 2021-04-17 DIAGNOSIS — C799 Secondary malignant neoplasm of unspecified site: Secondary | ICD-10-CM | POA: Diagnosis not present

## 2021-04-17 DIAGNOSIS — C778 Secondary and unspecified malignant neoplasm of lymph nodes of multiple regions: Secondary | ICD-10-CM | POA: Insufficient documentation

## 2021-04-17 DIAGNOSIS — M2550 Pain in unspecified joint: Secondary | ICD-10-CM | POA: Insufficient documentation

## 2021-04-17 DIAGNOSIS — C3490 Malignant neoplasm of unspecified part of unspecified bronchus or lung: Secondary | ICD-10-CM

## 2021-04-17 DIAGNOSIS — C3411 Malignant neoplasm of upper lobe, right bronchus or lung: Secondary | ICD-10-CM | POA: Diagnosis not present

## 2021-04-17 DIAGNOSIS — Z95828 Presence of other vascular implants and grafts: Secondary | ICD-10-CM

## 2021-04-17 DIAGNOSIS — R413 Other amnesia: Secondary | ICD-10-CM

## 2021-04-17 LAB — CBC WITH DIFFERENTIAL (CANCER CENTER ONLY)
Abs Immature Granulocytes: 0.02 10*3/uL (ref 0.00–0.07)
Basophils Absolute: 0 10*3/uL (ref 0.0–0.1)
Basophils Relative: 1 %
Eosinophils Absolute: 0.1 10*3/uL (ref 0.0–0.5)
Eosinophils Relative: 2 %
HCT: 37.5 % — ABNORMAL LOW (ref 39.0–52.0)
Hemoglobin: 12.9 g/dL — ABNORMAL LOW (ref 13.0–17.0)
Immature Granulocytes: 0 %
Lymphocytes Relative: 25 %
Lymphs Abs: 1.5 10*3/uL (ref 0.7–4.0)
MCH: 30.4 pg (ref 26.0–34.0)
MCHC: 34.4 g/dL (ref 30.0–36.0)
MCV: 88.4 fL (ref 80.0–100.0)
Monocytes Absolute: 0.5 10*3/uL (ref 0.1–1.0)
Monocytes Relative: 8 %
Neutro Abs: 3.9 10*3/uL (ref 1.7–7.7)
Neutrophils Relative %: 64 %
Platelet Count: 336 10*3/uL (ref 150–400)
RBC: 4.24 MIL/uL (ref 4.22–5.81)
RDW: 13.4 % (ref 11.5–15.5)
WBC Count: 5.9 10*3/uL (ref 4.0–10.5)
nRBC: 0 % (ref 0.0–0.2)

## 2021-04-17 LAB — CMP (CANCER CENTER ONLY)
ALT: 27 U/L (ref 0–44)
AST: 17 U/L (ref 15–41)
Albumin: 4.2 g/dL (ref 3.5–5.0)
Alkaline Phosphatase: 80 U/L (ref 38–126)
Anion gap: 8 (ref 5–15)
BUN: 13 mg/dL (ref 8–23)
CO2: 24 mmol/L (ref 22–32)
Calcium: 9 mg/dL (ref 8.9–10.3)
Chloride: 108 mmol/L (ref 98–111)
Creatinine: 0.95 mg/dL (ref 0.61–1.24)
GFR, Estimated: >60 mL/min (ref >60)
Glucose, Bld: 183 mg/dL — ABNORMAL HIGH (ref 70–99)
Potassium: 3.8 mmol/L (ref 3.5–5.1)
Sodium: 140 mmol/L (ref 135–145)
Total Bilirubin: 0.8 mg/dL (ref 0.3–1.2)
Total Protein: 7.8 g/dL (ref 6.5–8.1)

## 2021-04-17 LAB — TSH: TSH: 0.749 u[IU]/mL (ref 0.320–4.118)

## 2021-04-17 LAB — VITAMIN B12: Vitamin B-12: 302 pg/mL (ref 180–914)

## 2021-04-17 MED ORDER — SODIUM CHLORIDE 0.9 % IV SOLN: Freq: Once | INTRAVENOUS | Status: AC

## 2021-04-17 MED ORDER — SODIUM CHLORIDE 0.9% FLUSH
10.0000 mL | Freq: Once | INTRAVENOUS | Status: AC
  Administered 2021-04-17: 10 mL

## 2021-04-17 MED ORDER — HEPARIN SOD (PORK) LOCK FLUSH 100 UNIT/ML IV SOLN
500.0000 [IU] | Freq: Once | INTRAVENOUS | Status: AC | PRN
  Administered 2021-04-17: 500 [IU]

## 2021-04-17 MED ORDER — SODIUM CHLORIDE 0.9 % IV SOLN
350.0000 mg | Freq: Once | INTRAVENOUS | Status: AC
  Administered 2021-04-17: 350 mg via INTRAVENOUS
  Filled 2021-04-17: qty 7

## 2021-04-17 MED ORDER — SODIUM CHLORIDE 0.9% FLUSH
10.0000 mL | INTRAVENOUS | Status: DC | PRN
  Administered 2021-04-17: 10 mL

## 2021-04-17 NOTE — Telephone Encounter (Signed)
Refill Request   meloxicam (MOBIC) 7.5 MG tablet  [id number] - [place], [place] [address] (Ph: [phone])

## 2021-04-17 NOTE — Progress Notes (Signed)
[place] Telephone:(336) [phone]   Fax:(336) [phone]  OFFICE PROGRESS NOTE  [name], MD [place] [address]  DIAGNOSIS: stage IV (TX, N2, M1 C) non-small cell lung cancer, adenocarcinoma diagnosed in August 2021 and presented with solitary brain metastasis in addition to mediastinal lymphadenopathy.  PDL1 Expression 70%   Molecular Biomarkers:  Tumor Mutational Burden - 52 Muts/Mb Microsatellite status - MS-Stable Genomic Findings For a complete list of the genes assayed, please refer to the Appendix. NF1 E1694* MTAP loss exons 2-8 RICTOR amplification ATRX A419V BRAF K483E CDKN2A/B CDKN2A loss, CDKN2B loss DNMT3A E205* FGF10 amplification NTRK1 amplification - equivocal? 7 Disease relevant genes with no reportable alterations: ALK, EGFR, ERBB2, KRAS, MET, RET, ROS1    PRIOR THERAPY:  1) Status post right craniotomy with tumor resection followed by [facility] to solitary brain metastasis under the care of Dr. Lisbeth Renshaw and Dr. Sherral Hammers. 2) Concurrent chemoradiation with weekly carboplatin for AUC of 2 and paclitaxel 45 mg/M2.  First dose 02/01/2020. Status post 5 cycles.  Last dose was given February 29, 2020.   CURRENT THERAPY:  First-line treatment with immunotherapy with Libtayo (Cempilimab) 350 mg IV every 3 weeks.  First dose April 25, 2020.  Status post 16 cycles.  INTERVAL HISTORY: Jared Shea. 65 y.o. male returns to the clinic today for follow-up visit.  The patient is feeling fine today with no concerning complaints except for the arthralgia.  He denied having any current chest pain, shortness of breath, cough or hemoptysis.  He denied having any fever or chills.  He has no nausea, vomiting, diarrhea or constipation.  He has no headache or visual changes.  He denied having any fever or chills.  He continues to tolerate his treatment with Libtayo (Cempilimab) fairly well except for the arthralgia.  The patient had repeat CT  scan of the chest, abdomen pelvis performed recently and he is here for evaluation and discussion of his scan results.  MEDICAL HISTORY: Past Medical History:  Diagnosis Date   Brain mass    Bursitis of right hip    Chest pain 08/12/2018   Chronic cough    Dependence on nicotine from cigarettes    Diabetes mellitus without complication ([place])    Essential hypertension 07/28/2018   nscl ca dx'd 12/2019   Seizures ([place])    Viral illness 03/23/2019    ALLERGIES:  is allergic to penicillins.  MEDICATIONS:  Current Outpatient Medications  Medication Sig Dispense Refill   doxycycline (VIBRA-TABS) 100 MG tablet Take 1 tablet (100 mg total) by mouth 2 (two) times daily. 20 tablet 0   levETIRAcetam (KEPPRA) 750 MG tablet Take 1 tablet (750 mg total) by mouth 2 (two) times daily. 180 tablet 4   losartan (COZAAR) 25 MG tablet Take 1 tablet (25 mg total) by mouth daily. 30 tablet 11   meloxicam (MOBIC) 7.5 MG tablet Take 1 tablet (7.5 mg total) by mouth daily. 90 tablet 0   nortriptyline (PAMELOR) 25 MG capsule      No current facility-administered medications for this visit.    SURGICAL HISTORY:  Past Surgical History:  Procedure Laterality Date   APPLICATION OF CRANIAL NAVIGATION N/A 01/07/2020   Procedure: APPLICATION OF CRANIAL NAVIGATION;  Surgeon: Ashok Pall, MD;  Location: [facility];  Service: Neurosurgery;  Laterality: N/A;   CRANIOTOMY Right 01/07/2020   Procedure: RIGHT CRANIOTOMY FOR TUMOR RESECTION;  Surgeon: Ashok Pall, MD;  Location: [facility];  Service: Neurosurgery;  Laterality: Right;  rm 21   ENDOBRONCHIAL ULTRASOUND N/A 12/21/2019   Procedure: ENDOBRONCHIAL ULTRASOUND;  Surgeon: Laurin Coder, MD;  Location: [facility];  Service: Pulmonary;  Laterality: N/A;   FINE NEEDLE ASPIRATION  12/21/2019   Procedure: FINE NEEDLE ASPIRATION (FNA) LINEAR;  Surgeon: Laurin Coder, MD;  Location: [facility];  Service: Pulmonary;;   IR IMAGING GUIDED PORT INSERTION  03/03/2020    NO PAST SURGERIES     VIDEO BRONCHOSCOPY N/A 12/21/2019   Procedure: VIDEO BRONCHOSCOPY WITHOUT FLUORO;  Surgeon: Laurin Coder, MD;  Location: [facility];  Service: Pulmonary;  Laterality: N/A;    REVIEW OF SYSTEMS:  Constitutional: negative Eyes: negative Ears, nose, mouth, throat, and face: negative Respiratory: negative Cardiovascular: negative Gastrointestinal: negative Genitourinary:negative Integument/breast: negative Hematologic/lymphatic: negative Musculoskeletal:positive for arthralgias Neurological: negative Behavioral/Psych: negative Endocrine: negative Allergic/Immunologic: negative   PHYSICAL EXAMINATION: General appearance: alert, cooperative, and no distress Head: Normocephalic, without obvious abnormality, atraumatic Neck: no adenopathy, no JVD, supple, symmetrical, trachea midline, and thyroid not enlarged, symmetric, no tenderness/mass/nodules Lymph nodes: Cervical, supraclavicular, and axillary nodes normal. Resp: clear to auscultation bilaterally Back: symmetric, no curvature. ROM normal. No CVA tenderness. Cardio: regular rate and rhythm, S1, S2 normal, no murmur, click, rub or gallop GI: soft, non-tender; bowel sounds normal; no masses,  no organomegaly Extremities: extremities normal, atraumatic, no cyanosis or edema Neurologic: Alert and oriented X 3, normal strength and tone. Normal symmetric reflexes. Normal coordination and gait  ECOG PERFORMANCE STATUS: 1 - Symptomatic but completely ambulatory  Blood pressure 138/74, pulse 93, temperature (!) 97.3 F (36.3 C), temperature source Tympanic, resp. rate 18, height '6\' 1"'  (1.854 m), weight 195 lb 6.4 oz (88.6 kg), SpO2 100 %.  LABORATORY DATA: Lab Results  Component Value Date   WBC 6.8 03/27/2021   HGB 12.9 (L) 03/27/2021   HCT 37.8 (L) 03/27/2021   MCV 88.7 03/27/2021   PLT 330 03/27/2021      Chemistry      Component Value Date/Time   NA 139 03/27/2021 1028   NA 140 11/23/2019 1516    K 4.1 03/27/2021 1028   CL 107 03/27/2021 1028   CO2 24 03/27/2021 1028   BUN 16 03/27/2021 1028   BUN 14 11/23/2019 1516   CREATININE 0.83 03/27/2021 1028      Component Value Date/Time   CALCIUM 9.2 03/27/2021 1028   ALKPHOS 77 03/27/2021 1028   AST 25 03/27/2021 1028   ALT 31 03/27/2021 1028   BILITOT 0.7 03/27/2021 1028       RADIOGRAPHIC STUDIES: CT Chest W Contrast  Result Date: 04/12/2021 CLINICAL DATA:  Primary Cancer Type: Lung Imaging Indication: Assess response to therapy Interval therapy since last imaging? Yes Initial Cancer Diagnosis Date: 12/21/2019; Established by: Biopsy-proven Detailed Pathology: Stage IV non-small cell lung cancer, adenocarcinoma. Primary Tumor location: Left upper lobe. Mediastinal lymphadenopathy. Surgeries: Right craniotomy for tumor resection.  No thoracic. Chemotherapy: Yes; Ongoing? No; Most recent administration: 02/29/2020 Immunotherapy?  Yes; Type: Cempilimab; Ongoing? Yes Radiation therapy? Yes Date Range: 01/25/2020 - 03/14/2020; Target: Left lung Date Range: 04/22/2020; Target: Brain EXAM: CT CHEST, ABDOMEN AND PELVIS WITH CONTRAST TECHNIQUE: Multidetector CT imaging of the chest was performed during intravenous contrast administration. CONTRAST:  44m OMNIPAQUE IOHEXOL 350 MG/ML SOLN, additional oral enteric contrast COMPARISON:  Most recent CT chest, abdomen and pelvis 01/20/2021. 01/01/2020 PET-CT. FINDINGS: CT CHEST FINDINGS Cardiovascular: Right chest port catheter. Normal heart size. Left coronary artery calcifications. No pericardial effusion. Mediastinum/Nodes: No enlarged mediastinal, hilar,  or axillary lymph nodes. Thyroid gland, trachea, and esophagus demonstrate no significant findings. Lungs/Pleura: Unchanged post treatment appearance of the infrahilar left lower lobe, without discrete mass (series 4, image 89). Mild paramedian radiation fibrosis of the upper lobes (series 4, image 73). Mild centrilobular emphysema and diffuse  bilateral bronchial wall thickening. No pleural effusion or pneumothorax. Musculoskeletal: No chest wall mass or suspicious bone lesions identified. CT ABDOMEN PELVIS FINDINGS Hepatobiliary: No solid liver abnormality is seen. No gallstones, gallbladder wall thickening, or biliary dilatation. Pancreas: Unremarkable. No pancreatic ductal dilatation or surrounding inflammatory changes. Spleen: Normal in size without significant abnormality. Adrenals/Urinary Tract: Adrenal glands are unremarkable. Kidneys are normal, without renal calculi, solid lesion, or hydronephrosis. Bladder is unremarkable. Stomach/Bowel: Stomach is within normal limits. Appendix appears normal. No evidence of bowel wall thickening, distention, or inflammatory changes. Sigmoid diverticula. Vascular/Lymphatic: Aortic atherosclerosis. Unchanged enlarged portacaval lymph nodes, largest measuring 3.2 x 1.7 cm (series 2, image 65) Reproductive: No mass or other abnormality. Other: No abdominal wall hernia or abnormality. No abdominopelvic ascites. Musculoskeletal: No acute or significant osseous findings. IMPRESSION: 1. Unchanged post treatment appearance of the infrahilar left lower lobe, without discrete mass on current examination. 2. Mild paramedian radiation fibrosis of the upper lobes. 3. Unchanged enlarged portacaval lymph nodes. These remain nonspecific and were not previously PET avid, generally favored to reflect reactive lymph nodes rather than an isolated manifestation of abdominal metastatic disease. Continued attention on follow-up. 4. Mild emphysema and diffuse bilateral bronchial wall thickening. 5. Coronary artery disease. Aortic Atherosclerosis (ICD10-I70.0) and Emphysema (ICD10-J43.9). Electronically Signed   By: Delanna Ahmadi M.D.   On: 04/12/2021 10:45   CT Abdomen Pelvis W Contrast  Result Date: 04/12/2021 CLINICAL DATA:  Primary Cancer Type: Lung Imaging Indication: Assess response to therapy Interval therapy since last  imaging? Yes Initial Cancer Diagnosis Date: 12/21/2019; Established by: Biopsy-proven Detailed Pathology: Stage IV non-small cell lung cancer, adenocarcinoma. Primary Tumor location: Left upper lobe. Mediastinal lymphadenopathy. Surgeries: Right craniotomy for tumor resection.  No thoracic. Chemotherapy: Yes; Ongoing? No; Most recent administration: 02/29/2020 Immunotherapy?  Yes; Type: Cempilimab; Ongoing? Yes Radiation therapy? Yes Date Range: 01/25/2020 - 03/14/2020; Target: Left lung Date Range: 04/22/2020; Target: Brain EXAM: CT CHEST, ABDOMEN AND PELVIS WITH CONTRAST TECHNIQUE: Multidetector CT imaging of the chest was performed during intravenous contrast administration. CONTRAST:  39m OMNIPAQUE IOHEXOL 350 MG/ML SOLN, additional oral enteric contrast COMPARISON:  Most recent CT chest, abdomen and pelvis 01/20/2021. 01/01/2020 PET-CT. FINDINGS: CT CHEST FINDINGS Cardiovascular: Right chest port catheter. Normal heart size. Left coronary artery calcifications. No pericardial effusion. Mediastinum/Nodes: No enlarged mediastinal, hilar, or axillary lymph nodes. Thyroid gland, trachea, and esophagus demonstrate no significant findings. Lungs/Pleura: Unchanged post treatment appearance of the infrahilar left lower lobe, without discrete mass (series 4, image 89). Mild paramedian radiation fibrosis of the upper lobes (series 4, image 73). Mild centrilobular emphysema and diffuse bilateral bronchial wall thickening. No pleural effusion or pneumothorax. Musculoskeletal: No chest wall mass or suspicious bone lesions identified. CT ABDOMEN PELVIS FINDINGS Hepatobiliary: No solid liver abnormality is seen. No gallstones, gallbladder wall thickening, or biliary dilatation. Pancreas: Unremarkable. No pancreatic ductal dilatation or surrounding inflammatory changes. Spleen: Normal in size without significant abnormality. Adrenals/Urinary Tract: Adrenal glands are unremarkable. Kidneys are normal, without renal calculi,  solid lesion, or hydronephrosis. Bladder is unremarkable. Stomach/Bowel: Stomach is within normal limits. Appendix appears normal. No evidence of bowel wall thickening, distention, or inflammatory changes. Sigmoid diverticula. Vascular/Lymphatic: Aortic atherosclerosis. Unchanged enlarged portacaval lymph nodes, largest measuring 3.2  x 1.7 cm (series 2, image 65) Reproductive: No mass or other abnormality. Other: No abdominal wall hernia or abnormality. No abdominopelvic ascites. Musculoskeletal: No acute or significant osseous findings. IMPRESSION: 1. Unchanged post treatment appearance of the infrahilar left lower lobe, without discrete mass on current examination. 2. Mild paramedian radiation fibrosis of the upper lobes. 3. Unchanged enlarged portacaval lymph nodes. These remain nonspecific and were not previously PET avid, generally favored to reflect reactive lymph nodes rather than an isolated manifestation of abdominal metastatic disease. Continued attention on follow-up. 4. Mild emphysema and diffuse bilateral bronchial wall thickening. 5. Coronary artery disease. Aortic Atherosclerosis (ICD10-I70.0) and Emphysema (ICD10-J43.9). Electronically Signed   By: Delanna Ahmadi M.D.   On: 04/12/2021 10:45   VAS US CAROTID  Result Date: 03/21/2021 Carotid Arterial Duplex Study Patient Name:  Jared Shea.  Date of Exam:   03/21/2021 Medical Rec #: [id number]        Accession #:    [id number] Date of Birth: February 27, 1956        Patient Gender: M Patient Age:   65 years Exam Location:  [facility] Procedure:      VAS US CAROTID Referring Phys: Marcial Pacas --------------------------------------------------------------------------------  Indications:       Visual disturbance. Risk Factors:      Hypertension, Diabetes, past history of smoking. Other Factors:     CA (metastatic). Comparison Study:  No previous exams Performing Technologist: Jody Hill RVT, RDMS  Examination Guidelines: A complete evaluation includes  B-mode imaging, spectral Doppler, color Doppler, and power Doppler as needed of all accessible portions of each vessel. Bilateral testing is considered an integral part of a complete examination. Limited examinations for reoccurring indications may be performed as noted.  Right Carotid Findings: +----------+--------+--------+--------+------------------+------------------+           PSV cm/sEDV cm/sStenosisPlaque DescriptionComments           +----------+--------+--------+--------+------------------+------------------+ CCA Prox  86      12                                intimal thickening +----------+--------+--------+--------+------------------+------------------+ CCA Distal81      12                                intimal thickening +----------+--------+--------+--------+------------------+------------------+ ICA Prox  31      11                                                   +----------+--------+--------+--------+------------------+------------------+ ICA Distal45      14                                                   +----------+--------+--------+--------+------------------+------------------+ ECA       54      7                                                    +----------+--------+--------+--------+------------------+------------------+ +----------+--------+-------+----------------+-------------------+  PSV cm/sEDV cmsDescribe        Arm Pressure (mmHG) +----------+--------+-------+----------------+-------------------+ [id number]             Multiphasic, WNL                    +----------+--------+-------+----------------+-------------------+ +---------+--------+--+--------+--+---------+ VertebralPSV cm/s38EDV cm/s12Antegrade +---------+--------+--+--------+--+---------+  Left Carotid Findings: +----------+--------+--------+--------+------------------+------------------+           PSV cm/sEDV cm/sStenosisPlaque  DescriptionComments           +----------+--------+--------+--------+------------------+------------------+ CCA Prox  82      15                                intimal thickening +----------+--------+--------+--------+------------------+------------------+ CCA Distal71      20              hypoechoic        intimal thickening +----------+--------+--------+--------+------------------+------------------+ ICA Prox  58      13              heterogenous                         +----------+--------+--------+--------+------------------+------------------+ ICA Distal64      20                                                   +----------+--------+--------+--------+------------------+------------------+ ECA       116     17                                                   +----------+--------+--------+--------+------------------+------------------+ +----------+--------+--------+----------------+-------------------+           PSV cm/sEDV cm/sDescribe        Arm Pressure (mmHG) +----------+--------+--------+----------------+-------------------+ [id number]             Multiphasic, WNL                    +----------+--------+--------+----------------+-------------------+ +---------+--------+--------+--------------+ VertebralPSV cm/sEDV cm/sNot identified +---------+--------+--------+--------------+   Summary: Right Carotid: The extracranial vessels were near-normal with only minimal wall                thickening or plaque. Left Carotid: The extracranial vessels were near-normal with only minimal wall               thickening or plaque. Vertebrals:  Right vertebral artery demonstrates antegrade flow. Left vertebral              artery was not visualized. Subclavians: Normal flow hemodynamics were seen in bilateral subclavian              arteries. *See table(s) above for measurements and observations.  Electronically signed by Antony Contras MD on 03/21/2021 at 5:52:45 PM.     Final      ASSESSMENT AND PLAN: This is a very pleasant 65 years old African-American male recently diagnosed with a stage IV (TX, N2, M1c) non-small cell lung cancer, adenocarcinoma presented with solitary brain metastasis in addition to right hilar and mediastinal lymphadenopathy diagnosed in August 2021.  The patient is status post right craniotomy with resection of the solitary brain metastasis with SRS. His  PET scan showed no evidence of metastatic disease outside the chest. The patient was treated with a course of concurrent chemoradiation with weekly carboplatin and paclitaxel status post 5 cycles.  He tolerated his treatment well except for dysphagia and odynophagia. His PD-L1 expression is 70%. The patient started treatment with immunotherapy with Cemiplimab 350 mg IV every 3 weeks status post 16 cycles.   The patient has been tolerating his treatment with Libtayo (Cempilimab) fairly well except for the arthralgia.  He had repeat CT scan of the chest, abdomen pelvis performed recently.  I personally and independently reviewed the scans and discussed the results with the patient today. His scan showed no concerning findings for disease progression. For the arthralgia he will continue his current treatment with Mobic and ibuprofen on as-needed basis. The patient will come back for follow-up visit in 3 weeks for evaluation before the next cycle of his treatment. He was advised to call immediately if he has any other concerning symptoms in the interval. The patient voices understanding of current disease status and treatment options and is in agreement with the current care plan.  All questions were answered. The patient knows to call the clinic with any problems, questions or concerns. We can certainly see the patient much sooner if necessary.   Disclaimer: This note was dictated with voice recognition software. Similar sounding words can inadvertently be transcribed and may not be corrected  upon review.

## 2021-04-17 NOTE — Progress Notes (Signed)
Treatment given per orders. Patient tolerated it well without problems. Vitals stable and discharged home from clinic ambulatory. Follow up as scheduled.  

## 2021-04-17 NOTE — Patient Instructions (Signed)
[facility]  Discharge Instructions: Thank you for choosing [facility] to provide your oncology and hematology care.   If you have a lab appointment with the [facility], please go directly to the [facility] and check in at the registration area.   Wear comfortable clothing and clothing appropriate for easy access to any Portacath or PICC line.   We strive to give you quality time with your provider. You may need to reschedule your appointment if you arrive late (15 or more minutes).  Arriving late affects you and other patients whose appointments are after yours.  Also, if you miss three or more appointments without notifying the office, you may be dismissed from the clinic at the provider's discretion.      For prescription refill requests, have your pharmacy contact our office and allow 72 hours for refills to be completed.    Today you received the following chemotherapy and/or immunotherapy agents Libatayo   To help prevent nausea and vomiting after your treatment, we encourage you to take your nausea medication as directed.  BELOW ARE SYMPTOMS THAT SHOULD BE REPORTED IMMEDIATELY: *FEVER GREATER THAN 100.4 F (38 C) OR HIGHER *CHILLS OR SWEATING *NAUSEA AND VOMITING THAT IS NOT CONTROLLED WITH YOUR NAUSEA MEDICATION *UNUSUAL SHORTNESS OF BREATH *UNUSUAL BRUISING OR BLEEDING *URINARY PROBLEMS (pain or burning when urinating, or frequent urination) *BOWEL PROBLEMS (unusual diarrhea, constipation, pain near the anus) TENDERNESS IN MOUTH AND THROAT WITH OR WITHOUT PRESENCE OF ULCERS (sore throat, sores in mouth, or a toothache) UNUSUAL RASH, SWELLING OR PAIN  UNUSUAL VAGINAL DISCHARGE OR ITCHING   Items with * indicate a potential emergency and should be followed up as soon as possible or go to the Emergency Department if any problems should occur.  Please show the CHEMOTHERAPY ALERT CARD or IMMUNOTHERAPY ALERT CARD at check-in to the  Emergency Department and triage nurse.  Should you have questions after your visit or need to cancel or reschedule your appointment, please contact [facility]  Dept: [phone]  and follow the prompts.  Office hours are 8:00 a.m. to 4:30 p.m. Monday - Friday. Please note that voicemails left after 4:00 p.m. may not be returned until the following business day.  We are closed weekends and major holidays. You have access to a nurse at all times for urgent questions. Please call the main number to the clinic Dept: [phone] and follow the prompts.   For any non-urgent questions, you may also contact your provider using MyChart. We now offer e-Visits for anyone 53 and older to request care online for non-urgent symptoms. For details visit [email or web address].   Also download the MyChart app! Go to the app store, search "MyChart", open the app, select [facility], and log in with your MyChart username and password.  Due to Covid, a mask is required upon entering the hospital/clinic. If you do not have a mask, one will be given to you upon arrival. For doctor visits, patients may have 1 support person aged 68 or older with them. For treatment visits, patients cannot have anyone with them due to current Covid guidelines and our immunocompromised population.

## 2021-05-02 NOTE — Progress Notes (Signed)
[facility] OFFICE PROGRESS NOTE  [name], MD [place] [address]  DIAGNOSIS: Stage IV (TX, N2, M1 C) non-small cell lung cancer, adenocarcinoma diagnosed in August 2021 and presented with solitary brain metastasis in addition to mediastinal lymphadenopathy.   PDL1 Expression 70%   Molecular Biomarkers:  Tumor Mutational Burden - 52 Muts/Mb Microsatellite status - MS-Stable Genomic Findings For a complete list of the genes assayed, please refer to the Appendix. NF1 E1694* MTAP loss exons 2-8 RICTOR amplification ATRX A419V BRAF K483E CDKN2A/B CDKN2A loss, CDKN2B loss DNMT3A E205* FGF10 amplification NTRK1 amplification - equivocal 7 Disease relevant genes with no reportable alterations: ALK, EGFR, ERBB2, KRAS, MET, RET, ROS1     PRIOR THERAPY: 1) Status post right craniotomy with tumor resection followed by [facility] to solitary brain metastasis under the care of Dr. Lisbeth Renshaw and Dr. Sherral Hammers. 2) Concurrent chemoradiation with weekly carboplatin for AUC of 2 and paclitaxel 45 mg/M2.  First dose 02/01/2020. Status post 5 cycles.  Last dose was given February 29, 2020.  CURRENT THERAPY: First-line treatment with immunotherapy with Libtayo (Cempilimab) 350 mg IV every 3 weeks.  First dose April 25, 2020.  Status post 16 cycles.    INTERVAL HISTORY: Estle Huguley. 65 y.o. male returns to the clinic today for a follow-up visit.  The patient is feeling fairly well today without any concerning complaints except for arthralgias for which he takes 800 mg of ibuprofen twice a day.  He was previously prescribed Norco and instructed to alternate if needed.  The patient is not a reliable historian and has some memory deficits since undergoing treatment for his metastatic disease to the brain.  The patient's last prescription of Norco was sent in September 2022 for 20 tablets.  He also has Voltaren cream that he uses.  The patient denies any history of any  degenerative joint disease.  He states his joint pain started after starting immunotherapy.  We previously offered to give him a break from treatment which he declined.  The patient reports pain in most to his joints but his knees are the worst bilaterally.    He denies any fever or chills. He used to be on Remeron for his decreased appetite but he stopped taking it. He does not think it was working. He is not interested in resuming this. He denies any changes with his breathing. He reports mild shortness of breath with bending over. He also has a mild cough.  His breathing concerns are stable.  He denies any recent hemoptysis or chest pain.  He denies any nausea, vomiting, diarrhea, or constipation.  He denies any rashes or skin changes.  He continues to report changes with his vision since undergoing treatment for his metastatic disease to the brain.  He is scheduled for follow-up appointment with Dr. Mickeal Skinner on 05/29/2021.  He did not follow-up with neuro oncology after his repeat brain MRI in October 2022.  He is here today for evaluation before proceeding with cycle #18 today as scheduled.   MEDICAL HISTORY: Past Medical History:  Diagnosis Date   Brain mass    Bursitis of right hip    Chest pain 08/12/2018   Chronic cough    Dependence on nicotine from cigarettes    Diabetes mellitus without complication ([place])    Essential hypertension 07/28/2018   nscl ca dx'd 12/2019   Seizures ([place])    Viral illness 03/23/2019    ALLERGIES:  is allergic to penicillins.  MEDICATIONS:  Current Outpatient Medications  Medication Sig Dispense Refill   HYDROcodone-acetaminophen (NORCO) 5-325 MG tablet Take 1 tablet by mouth every 6 (six) hours as needed for moderate pain. 30 tablet 0   ibuprofen (ADVIL) 800 MG tablet Take 800 mg by mouth 3 (three) times daily.     levETIRAcetam (KEPPRA) 750 MG tablet Take 1 tablet (750 mg total) by mouth 2 (two) times daily. 180 tablet 4   losartan (COZAAR) 25 MG tablet Take  1 tablet (25 mg total) by mouth daily. 30 tablet 11   meloxicam (MOBIC) 7.5 MG tablet Take 1 tablet (7.5 mg total) by mouth daily. 90 tablet 0   nortriptyline (PAMELOR) 25 MG capsule      omeprazole (PRILOSEC) 20 MG capsule Take 20 mg by mouth daily. Pt takes in the morning     No current facility-administered medications for this visit.    SURGICAL HISTORY:  Past Surgical History:  Procedure Laterality Date   APPLICATION OF CRANIAL NAVIGATION N/A 01/07/2020   Procedure: APPLICATION OF CRANIAL NAVIGATION;  Surgeon: Ashok Pall, MD;  Location: [facility];  Service: Neurosurgery;  Laterality: N/A;   CRANIOTOMY Right 01/07/2020   Procedure: RIGHT CRANIOTOMY FOR TUMOR RESECTION;  Surgeon: Ashok Pall, MD;  Location: [facility];  Service: Neurosurgery;  Laterality: Right;  rm 21   ENDOBRONCHIAL ULTRASOUND N/A 12/21/2019   Procedure: ENDOBRONCHIAL ULTRASOUND;  Surgeon: Laurin Coder, MD;  Location: [facility];  Service: Pulmonary;  Laterality: N/A;   FINE NEEDLE ASPIRATION  12/21/2019   Procedure: FINE NEEDLE ASPIRATION (FNA) LINEAR;  Surgeon: Laurin Coder, MD;  Location: [facility];  Service: Pulmonary;;   IR IMAGING GUIDED PORT INSERTION  03/03/2020   NO PAST SURGERIES     VIDEO BRONCHOSCOPY N/A 12/21/2019   Procedure: VIDEO BRONCHOSCOPY WITHOUT FLUORO;  Surgeon: Laurin Coder, MD;  Location: [facility];  Service: Pulmonary;  Laterality: N/A;    REVIEW OF SYSTEMS:   Review of Systems  Constitutional: Positive for fatigue. Negative for appetite change, chills, and fever.  HENT: Negative for mouth sores, nosebleeds, sore throat and trouble swallowing.   Eyes: Positive for visual changes with bending over. Respiratory: Positive stable mild cough and dyspnea on exertion. Negative for hemoptysis and wheezing.   Cardiovascular: Negative for chest pain and leg swelling.  Gastrointestinal: Negative for abdominal pain, constipation, diarrhea, nausea and vomiting.  Genitourinary: Negative  for bladder incontinence, difficulty urinating, dysuria, frequency and hematuria.   Musculoskeletal: Positive for polyarthralgia, particularly in the knees.  Skin: Negative for itching and rash.  Neurological: Positive for occasional headaches lasting a few minutes. Negative for dizziness, extremity weakness, gait problem, light-headedness and seizures.  Hematological: Negative for adenopathy. Does not bruise/bleed easily.  Psychiatric/Behavioral: Positive for memory changes since undergoing treatment for metastatic disease to the brain. Negative for confusion, depression and sleep disturbance. The patient is not nervous/anxious.   PHYSICAL EXAMINATION:  Blood pressure 114/83, pulse 88, temperature 97.8 F (36.6 C), temperature source Tympanic, resp. rate 18, height $RemoveBe'6\' 1"'kCdpbxGZY$  (1.854 m), weight 200 lb 6.4 oz (90.9 kg), SpO2 100 %.  ECOG PERFORMANCE STATUS: 1  Physical Exam  Constitutional: Oriented to person, place, and time and well-developed, well-nourished, and in no distress.  HENT:  Head: Normocephalic and atraumatic.  Mouth/Throat: Oropharynx is clear and moist. No oropharyngeal exudate.  Eyes: Conjunctivae are normal. Right eye exhibits no discharge. Left eye exhibits no discharge. No scleral icterus.  Neck: Normal range of motion. Neck supple.  Cardiovascular: Normal rate, regular rhythm, normal heart  sounds and intact distal pulses.   Pulmonary/Chest: Effort normal and breath sounds normal. No respiratory distress. No wheezes. No rales.  Abdominal: Soft. Bowel sounds are normal. Exhibits no distension and no mass. There is no tenderness.  Musculoskeletal: Normal range of motion. Exhibits no edema.  Lymphadenopathy:    No cervical adenopathy.  Neurological: Alert and oriented to person, place, and time. Exhibits normal muscle tone. Gait normal. Coordination normal.  Skin: Skin is warm and dry. No rash noted. Not diaphoretic. No erythema. No pallor.  Psychiatric: Mood and judgment  normal.  Patient does have some memory deficits since undergoing treatment for the metastatic disease to the brain. Vitals reviewed.    LABORATORY DATA: Lab Results  Component Value Date   WBC 6.2 05/09/2021   HGB 12.4 (L) 05/09/2021   HCT 36.2 (L) 05/09/2021   MCV 87.7 05/09/2021   PLT 251 05/09/2021      Chemistry      Component Value Date/Time   NA 142 05/09/2021 0942   NA 140 11/23/2019 1516   K 3.3 (L) 05/09/2021 0942   CL 109 05/09/2021 0942   CO2 27 05/09/2021 0942   BUN 12 05/09/2021 0942   BUN 14 11/23/2019 1516   CREATININE 0.83 05/09/2021 0942      Component Value Date/Time   CALCIUM 8.9 05/09/2021 0942   ALKPHOS 69 05/09/2021 0942   AST 16 05/09/2021 0942   ALT 19 05/09/2021 0942   BILITOT 0.7 05/09/2021 0942       RADIOGRAPHIC STUDIES:  CT Chest W Contrast  Result Date: 04/12/2021 CLINICAL DATA:  Primary Cancer Type: Lung Imaging Indication: Assess response to therapy Interval therapy since last imaging? Yes Initial Cancer Diagnosis Date: 12/21/2019; Established by: Biopsy-proven Detailed Pathology: Stage IV non-small cell lung cancer, adenocarcinoma. Primary Tumor location: Left upper lobe. Mediastinal lymphadenopathy. Surgeries: Right craniotomy for tumor resection.  No thoracic. Chemotherapy: Yes; Ongoing? No; Most recent administration: 02/29/2020 Immunotherapy?  Yes; Type: Cempilimab; Ongoing? Yes Radiation therapy? Yes Date Range: 01/25/2020 - 03/14/2020; Target: Left lung Date Range: 04/22/2020; Target: Brain EXAM: CT CHEST, ABDOMEN AND PELVIS WITH CONTRAST TECHNIQUE: Multidetector CT imaging of the chest was performed during intravenous contrast administration. CONTRAST:  62mL OMNIPAQUE IOHEXOL 350 MG/ML SOLN, additional oral enteric contrast COMPARISON:  Most recent CT chest, abdomen and pelvis 01/20/2021. 01/01/2020 PET-CT. FINDINGS: CT CHEST FINDINGS Cardiovascular: Right chest port catheter. Normal heart size. Left coronary artery calcifications. No  pericardial effusion. Mediastinum/Nodes: No enlarged mediastinal, hilar, or axillary lymph nodes. Thyroid gland, trachea, and esophagus demonstrate no significant findings. Lungs/Pleura: Unchanged post treatment appearance of the infrahilar left lower lobe, without discrete mass (series 4, image 89). Mild paramedian radiation fibrosis of the upper lobes (series 4, image 73). Mild centrilobular emphysema and diffuse bilateral bronchial wall thickening. No pleural effusion or pneumothorax. Musculoskeletal: No chest wall mass or suspicious bone lesions identified. CT ABDOMEN PELVIS FINDINGS Hepatobiliary: No solid liver abnormality is seen. No gallstones, gallbladder wall thickening, or biliary dilatation. Pancreas: Unremarkable. No pancreatic ductal dilatation or surrounding inflammatory changes. Spleen: Normal in size without significant abnormality. Adrenals/Urinary Tract: Adrenal glands are unremarkable. Kidneys are normal, without renal calculi, solid lesion, or hydronephrosis. Bladder is unremarkable. Stomach/Bowel: Stomach is within normal limits. Appendix appears normal. No evidence of bowel wall thickening, distention, or inflammatory changes. Sigmoid diverticula. Vascular/Lymphatic: Aortic atherosclerosis. Unchanged enlarged portacaval lymph nodes, largest measuring 3.2 x 1.7 cm (series 2, image 65) Reproductive: No mass or other abnormality. Other: No abdominal wall hernia or abnormality. No  abdominopelvic ascites. Musculoskeletal: No acute or significant osseous findings. IMPRESSION: 1. Unchanged post treatment appearance of the infrahilar left lower lobe, without discrete mass on current examination. 2. Mild paramedian radiation fibrosis of the upper lobes. 3. Unchanged enlarged portacaval lymph nodes. These remain nonspecific and were not previously PET avid, generally favored to reflect reactive lymph nodes rather than an isolated manifestation of abdominal metastatic disease. Continued attention on  follow-up. 4. Mild emphysema and diffuse bilateral bronchial wall thickening. 5. Coronary artery disease. Aortic Atherosclerosis (ICD10-I70.0) and Emphysema (ICD10-J43.9). Electronically Signed   By: Delanna Ahmadi M.D.   On: 04/12/2021 10:45   CT Abdomen Pelvis W Contrast  Result Date: 04/12/2021 CLINICAL DATA:  Primary Cancer Type: Lung Imaging Indication: Assess response to therapy Interval therapy since last imaging? Yes Initial Cancer Diagnosis Date: 12/21/2019; Established by: Biopsy-proven Detailed Pathology: Stage IV non-small cell lung cancer, adenocarcinoma. Primary Tumor location: Left upper lobe. Mediastinal lymphadenopathy. Surgeries: Right craniotomy for tumor resection.  No thoracic. Chemotherapy: Yes; Ongoing? No; Most recent administration: 02/29/2020 Immunotherapy?  Yes; Type: Cempilimab; Ongoing? Yes Radiation therapy? Yes Date Range: 01/25/2020 - 03/14/2020; Target: Left lung Date Range: 04/22/2020; Target: Brain EXAM: CT CHEST, ABDOMEN AND PELVIS WITH CONTRAST TECHNIQUE: Multidetector CT imaging of the chest was performed during intravenous contrast administration. CONTRAST:  76mL OMNIPAQUE IOHEXOL 350 MG/ML SOLN, additional oral enteric contrast COMPARISON:  Most recent CT chest, abdomen and pelvis 01/20/2021. 01/01/2020 PET-CT. FINDINGS: CT CHEST FINDINGS Cardiovascular: Right chest port catheter. Normal heart size. Left coronary artery calcifications. No pericardial effusion. Mediastinum/Nodes: No enlarged mediastinal, hilar, or axillary lymph nodes. Thyroid gland, trachea, and esophagus demonstrate no significant findings. Lungs/Pleura: Unchanged post treatment appearance of the infrahilar left lower lobe, without discrete mass (series 4, image 89). Mild paramedian radiation fibrosis of the upper lobes (series 4, image 73). Mild centrilobular emphysema and diffuse bilateral bronchial wall thickening. No pleural effusion or pneumothorax. Musculoskeletal: No chest wall mass or suspicious  bone lesions identified. CT ABDOMEN PELVIS FINDINGS Hepatobiliary: No solid liver abnormality is seen. No gallstones, gallbladder wall thickening, or biliary dilatation. Pancreas: Unremarkable. No pancreatic ductal dilatation or surrounding inflammatory changes. Spleen: Normal in size without significant abnormality. Adrenals/Urinary Tract: Adrenal glands are unremarkable. Kidneys are normal, without renal calculi, solid lesion, or hydronephrosis. Bladder is unremarkable. Stomach/Bowel: Stomach is within normal limits. Appendix appears normal. No evidence of bowel wall thickening, distention, or inflammatory changes. Sigmoid diverticula. Vascular/Lymphatic: Aortic atherosclerosis. Unchanged enlarged portacaval lymph nodes, largest measuring 3.2 x 1.7 cm (series 2, image 65) Reproductive: No mass or other abnormality. Other: No abdominal wall hernia or abnormality. No abdominopelvic ascites. Musculoskeletal: No acute or significant osseous findings. IMPRESSION: 1. Unchanged post treatment appearance of the infrahilar left lower lobe, without discrete mass on current examination. 2. Mild paramedian radiation fibrosis of the upper lobes. 3. Unchanged enlarged portacaval lymph nodes. These remain nonspecific and were not previously PET avid, generally favored to reflect reactive lymph nodes rather than an isolated manifestation of abdominal metastatic disease. Continued attention on follow-up. 4. Mild emphysema and diffuse bilateral bronchial wall thickening. 5. Coronary artery disease. Aortic Atherosclerosis (ICD10-I70.0) and Emphysema (ICD10-J43.9). Electronically Signed   By: Delanna Ahmadi M.D.   On: 04/12/2021 10:45     ASSESSMENT/PLAN:  This is a very pleasant 65 year old African-American male diagnosed with stage IV (Tx, N2, M1c) non-small cell lung cancer, adenocarcinoma.  He presented with a solitary brain metastasis in addition to right hilar and mediastinal lymphadenopathy.  He was diagnosed in August  2021.  The patient is status post right craniotomy with resection of the solitary brain metastasis with SRS under the care of Dr. Lisbeth Renshaw and Dr. Christella Noa.    He completed weekly concurrent chemoradiation with carboplatin for an AUC of 2 and paclitaxel 45 mg per metered square.  He is status post 5 cycles.  He tolerated it well except for some dysphagia/odynophagia.   The patient then had evidence of new brain metastases.  He is status post SRS to the new lesions on 04/22/2020 under the care of Dr. Lisbeth Renshaw.   The patient is currently undergoing immunotherapy with Libtayo IV every 3 weeks.  He is status post 16 cycles and tolerated it well except for arthralgias.  Previously offered a break from treatment due to his arthralgias.  Revisited this again today.  He would like to continue with treatment as scheduled.  We will see him back for follow-up visit in 3 weeks for evaluation before starting cycle #18.  We will continue to follow closely with neurology and radiation oncology regarding his history of metastatic disease to the brain.  I have reached out to the neuro oncology about his next follow-up.  He missed his follow-up appointment with them in October 2022.  They recommend rescheduling a brain MRI and a follow-up in February 2023.  I reviewed the patient's pain management regimen with him.  Advised to use ibuprofen and alternate with Norco.  Is likely that he is not taking Norco as his last refill was sent in September 2022.  I sent in a refill today.  Also offered a referral to orthopedics for his arthralgias, particularly in the knees.  The patient was agreeable to this to see if there is any particular management or recommendations to help his situation.  The patient was advised to call immediately if he has any concerning symptoms in the interval. The patient voices understanding of current disease status and treatment options and is in agreement with the current care plan. All questions  were answered. The patient knows to call the clinic with any problems, questions or concerns. We can certainly see the patient much sooner if necessary           Orders Placed This Encounter  Procedures   AMB referral to orthopedics    Referral Priority:   Routine    Referral Type:   Consultation    Number of Visits Requested:   1     The total time spent in the appointment was 20-29 minutes.   Sanaii Caporaso L Lurlie Wigen, PA-C 05/09/21

## 2021-05-04 ENCOUNTER — Other Ambulatory Visit: Payer: Self-pay | Admitting: Physician Assistant

## 2021-05-04 DIAGNOSIS — C3490 Malignant neoplasm of unspecified part of unspecified bronchus or lung: Secondary | ICD-10-CM

## 2021-05-07 ENCOUNTER — Other Ambulatory Visit: Payer: Self-pay | Admitting: Internal Medicine

## 2021-05-09 ENCOUNTER — Inpatient Hospital Stay (HOSPITAL_BASED_OUTPATIENT_CLINIC_OR_DEPARTMENT_OTHER): Payer: Medicare HMO | Admitting: Physician Assistant

## 2021-05-09 ENCOUNTER — Encounter: Payer: Self-pay | Admitting: Physician Assistant

## 2021-05-09 ENCOUNTER — Other Ambulatory Visit: Payer: Self-pay | Admitting: *Deleted

## 2021-05-09 ENCOUNTER — Inpatient Hospital Stay: Payer: Medicare HMO

## 2021-05-09 ENCOUNTER — Other Ambulatory Visit: Payer: Self-pay

## 2021-05-09 VITALS — BP 114/83 | HR 88 | Temp 97.8°F | Resp 18 | Ht 73.0 in | Wt 200.4 lb

## 2021-05-09 DIAGNOSIS — C799 Secondary malignant neoplasm of unspecified site: Secondary | ICD-10-CM

## 2021-05-09 DIAGNOSIS — C3490 Malignant neoplasm of unspecified part of unspecified bronchus or lung: Secondary | ICD-10-CM | POA: Diagnosis not present

## 2021-05-09 DIAGNOSIS — G8929 Other chronic pain: Secondary | ICD-10-CM

## 2021-05-09 DIAGNOSIS — M25561 Pain in right knee: Secondary | ICD-10-CM | POA: Diagnosis not present

## 2021-05-09 DIAGNOSIS — Z95828 Presence of other vascular implants and grafts: Secondary | ICD-10-CM

## 2021-05-09 DIAGNOSIS — C7931 Secondary malignant neoplasm of brain: Secondary | ICD-10-CM

## 2021-05-09 DIAGNOSIS — Z5112 Encounter for antineoplastic immunotherapy: Secondary | ICD-10-CM | POA: Diagnosis not present

## 2021-05-09 DIAGNOSIS — M25562 Pain in left knee: Secondary | ICD-10-CM | POA: Diagnosis not present

## 2021-05-09 LAB — CBC WITH DIFFERENTIAL (CANCER CENTER ONLY)
Abs Immature Granulocytes: 0.03 10*3/uL (ref 0.00–0.07)
Basophils Absolute: 0 10*3/uL (ref 0.0–0.1)
Basophils Relative: 1 %
Eosinophils Absolute: 0.1 10*3/uL (ref 0.0–0.5)
Eosinophils Relative: 2 %
HCT: 36.2 % — ABNORMAL LOW (ref 39.0–52.0)
Hemoglobin: 12.4 g/dL — ABNORMAL LOW (ref 13.0–17.0)
Immature Granulocytes: 1 %
Lymphocytes Relative: 30 %
Lymphs Abs: 1.8 10*3/uL (ref 0.7–4.0)
MCH: 30 pg (ref 26.0–34.0)
MCHC: 34.3 g/dL (ref 30.0–36.0)
MCV: 87.7 fL (ref 80.0–100.0)
Monocytes Absolute: 0.5 10*3/uL (ref 0.1–1.0)
Monocytes Relative: 8 %
Neutro Abs: 3.7 10*3/uL (ref 1.7–7.7)
Neutrophils Relative %: 58 %
Platelet Count: 251 10*3/uL (ref 150–400)
RBC: 4.13 MIL/uL — ABNORMAL LOW (ref 4.22–5.81)
RDW: 13.9 % (ref 11.5–15.5)
WBC Count: 6.2 10*3/uL (ref 4.0–10.5)
nRBC: 0 % (ref 0.0–0.2)

## 2021-05-09 LAB — CMP (CANCER CENTER ONLY)
ALT: 19 U/L (ref 0–44)
AST: 16 U/L (ref 15–41)
Albumin: 4.3 g/dL (ref 3.5–5.0)
Alkaline Phosphatase: 69 U/L (ref 38–126)
Anion gap: 6 (ref 5–15)
BUN: 12 mg/dL (ref 8–23)
CO2: 27 mmol/L (ref 22–32)
Calcium: 8.9 mg/dL (ref 8.9–10.3)
Chloride: 109 mmol/L (ref 98–111)
Creatinine: 0.83 mg/dL (ref 0.61–1.24)
GFR, Estimated: >60 mL/min (ref >60)
Glucose, Bld: 113 mg/dL — ABNORMAL HIGH (ref 70–99)
Potassium: 3.3 mmol/L — ABNORMAL LOW (ref 3.5–5.1)
Sodium: 142 mmol/L (ref 135–145)
Total Bilirubin: 0.7 mg/dL (ref 0.3–1.2)
Total Protein: 7.4 g/dL (ref 6.5–8.1)

## 2021-05-09 LAB — TSH: TSH: 1.138 u[IU]/mL (ref 0.320–4.118)

## 2021-05-09 MED ORDER — SODIUM CHLORIDE 0.9% FLUSH
10.0000 mL | Freq: Once | INTRAVENOUS | Status: AC
  Administered 2021-05-09: 10:00:00 10 mL

## 2021-05-09 MED ORDER — SODIUM CHLORIDE 0.9 % IV SOLN: Freq: Once | INTRAVENOUS | Status: AC

## 2021-05-09 MED ORDER — HYDROCODONE-ACETAMINOPHEN 5-325 MG PO TABS: 1.0000 | ORAL_TABLET | Freq: Four times a day (QID) | ORAL | 0 refills | Status: DC | PRN

## 2021-05-09 MED ORDER — SODIUM CHLORIDE 0.9 % IV SOLN
350.0000 mg | Freq: Once | INTRAVENOUS | Status: AC
  Administered 2021-05-09: 12:00:00 350 mg via INTRAVENOUS
  Filled 2021-05-09: qty 7

## 2021-05-09 NOTE — Patient Instructions (Signed)
-  I am going to check with Dr Renda Rolls nurse to make sure that you do/don't need a brain MRI before you see him for a follow up on 05/29/21 -Offered referral to orthopedic physician to evaluate for other causes of joint pain in the knees. Declined but advised to call me if you change your mind.  -Please alternate between norco (sent refill today) and ibuprofen. Can also use your voltaren cream for your knee.

## 2021-05-25 ENCOUNTER — Ambulatory Visit: Payer: Medicare HMO | Admitting: Orthopaedic Surgery

## 2021-05-26 ENCOUNTER — Encounter: Payer: Self-pay | Admitting: Orthopaedic Surgery

## 2021-05-26 ENCOUNTER — Other Ambulatory Visit: Payer: Self-pay

## 2021-05-26 ENCOUNTER — Ambulatory Visit: Payer: Self-pay

## 2021-05-26 ENCOUNTER — Ambulatory Visit (INDEPENDENT_AMBULATORY_CARE_PROVIDER_SITE_OTHER): Payer: Medicare HMO | Admitting: Orthopaedic Surgery

## 2021-05-26 ENCOUNTER — Ambulatory Visit (INDEPENDENT_AMBULATORY_CARE_PROVIDER_SITE_OTHER): Payer: Medicare HMO

## 2021-05-26 DIAGNOSIS — G8929 Other chronic pain: Secondary | ICD-10-CM | POA: Diagnosis not present

## 2021-05-26 DIAGNOSIS — M25562 Pain in left knee: Secondary | ICD-10-CM

## 2021-05-26 DIAGNOSIS — M25561 Pain in right knee: Secondary | ICD-10-CM

## 2021-05-26 MED ORDER — BUPIVACAINE HCL 0.5 % IJ SOLN
2.0000 mL | INTRAMUSCULAR | Status: AC | PRN
  Administered 2021-05-26: 2 mL via INTRA_ARTICULAR

## 2021-05-26 MED ORDER — METHYLPREDNISOLONE ACETATE 40 MG/ML IJ SUSP
40.0000 mg | INTRAMUSCULAR | Status: AC | PRN
  Administered 2021-05-26: 40 mg via INTRA_ARTICULAR

## 2021-05-26 MED ORDER — LIDOCAINE HCL 1 % IJ SOLN
2.0000 mL | INTRAMUSCULAR | Status: AC | PRN
  Administered 2021-05-26: 2 mL

## 2021-05-26 NOTE — Progress Notes (Signed)
Office Visit Note   Patient: Jared Shea.           Date of Birth: January 28, 1956           MRN: [id number] Visit Date: 05/26/2021              Requested by: [name], MD [address] [place],   [address] PCP: [name], MD   Assessment & Plan: Visit Diagnoses:  1. Chronic pain of both knees     Plan: Impression is bilateral knee osteoarthritis exacerbation.  Treatment options discussed and patient elected to proceed with bilateral knee cortisone injections today.  We also provided information on Visco and Voltaren gel.  Follow-up as needed.  Follow-Up Instructions: No follow-ups on file.   Orders:  Orders Placed This Encounter  Procedures   XR KNEE 3 VIEW RIGHT   XR KNEE 3 VIEW LEFT   No orders of the defined types were placed in this encounter.     Procedures: Large Joint Inj: bilateral knee on 05/26/2021 9:54 AM Indications: pain Details: 22 G needle  Arthrogram: No  Medications (Right): 2 mL lidocaine 1 %; 2 mL bupivacaine 0.5 %; 40 mg methylPREDNISolone acetate 40 MG/ML Medications (Left): 2 mL lidocaine 1 %; 2 mL bupivacaine 0.5 %; 40 mg methylPREDNISolone acetate 40 MG/ML Outcome: tolerated well, no immediate complications Patient was prepped and draped in the usual sterile fashion.      Clinical Data: No additional findings.   Subjective: Chief Complaint  Patient presents with   Left Knee - Pain   Right Knee - Pain    [name] is a 66 year old gentleman here with his girlfriend for evaluation of chronic bilateral knee pain worse on the left.  Denies any injuries or traumas or surgeries to the knees in the past.  Denies any mechanical symptoms.  Denies any history of inflammatory arthritis or gout.  He states that he has pain with ambulation and with flexion of the knee.  Ibuprofen and meloxicam have not been helpful for the pain.   Review of Systems  Constitutional: Negative.   All other systems reviewed and are  negative.   Objective: Vital Signs: There were no vitals taken for this visit.  Physical Exam Vitals and nursing note reviewed.  Constitutional:      Appearance: He is well-developed.  Pulmonary:     Effort: Pulmonary effort is normal.  Abdominal:     Palpations: Abdomen is soft.  Skin:    General: Skin is warm.  Neurological:     Mental Status: He is alert and oriented to person, place, and time.  Psychiatric:        Behavior: Behavior normal.        Thought Content: Thought content normal.        Judgment: Judgment normal.    Ortho Exam  Bilateral knees show no joint effusion.  Moderate pain with range of motion.  Collaterals and cruciates are stable.  No joint line tenderness.  Patella tracking is normal.  Minimal patellofemoral crepitus.  Specialty Comments:  No specialty comments available.  Imaging: XR KNEE 3 VIEW LEFT  Result Date: 05/26/2021 Mild osteoarthritis.  Periarticular spurring.  Well-preserved joint spaces.  XR KNEE 3 VIEW RIGHT  Result Date: 05/26/2021 Mild osteoarthritis.  Periarticular spurring.  Well-preserved joint spaces.    PMFS History: Patient Active Problem List   Diagnosis Date Noted   Dysuria 04/03/2021   Vision changes 03/14/2021   Memory loss 03/14/2021  Port-A-Cath in place 07/05/2020   Frequent headaches 06/27/2020   Polyarthralgia 06/15/2020   Headache 05/09/2020   Encounter for antineoplastic immunotherapy 04/18/2020   Goals of care, counseling/discussion 04/18/2020   Hypokalemia 03/07/2020   Adenocarcinoma, metastatic ([place]) 01/07/2020   Adjustment disorder with depressed mood 01/06/2020   Steroid-induced hyperglycemia 01/04/2020   Thyroid nodule 01/04/2020   Non-small cell carcinoma of lung, stage 4 w/ mets to brain ([place]) 12/31/2019   Partial symptomatic epilepsy with complex partial seizures, not intractable, without status epilepticus ([place]) 12/23/2019   Brain metastases ([place]) 12/23/2019   Focal seizures ([place])  12/09/2019   Tobacco use disorder 08/12/2018   Essential hypertension 07/28/2018   Past Medical History:  Diagnosis Date   Brain mass    Bursitis of right hip    Chest pain 08/12/2018   Chronic cough    Dependence on nicotine from cigarettes    Diabetes mellitus without complication ([place])    Essential hypertension 07/28/2018   nscl ca dx'd 12/2019   Seizures ([place])    Viral illness 03/23/2019    Family History  Problem Relation Age of Onset   Heart disease Mother        CABG   Kidney disease Sister    Diabetes Brother    Kidney disease Brother        KIDNEY TRANSPLANT   Hypertension Sister    Healthy Daughter    Healthy Daughter    Healthy Son     Past Surgical History:  Procedure Laterality Date   APPLICATION OF CRANIAL NAVIGATION N/A 01/07/2020   Procedure: APPLICATION OF CRANIAL NAVIGATION;  Surgeon: Ashok Pall, MD;  Location: [facility];  Service: Neurosurgery;  Laterality: N/A;   CRANIOTOMY Right 01/07/2020   Procedure: RIGHT CRANIOTOMY FOR TUMOR RESECTION;  Surgeon: Ashok Pall, MD;  Location: [facility];  Service: Neurosurgery;  Laterality: Right;  rm 21   ENDOBRONCHIAL ULTRASOUND N/A 12/21/2019   Procedure: ENDOBRONCHIAL ULTRASOUND;  Surgeon: Laurin Coder, MD;  [facility];  Service: Pulmonary;  Laterality: N/A;   FINE NEEDLE ASPIRATION  12/21/2019   Procedure: FINE NEEDLE ASPIRATION (FNA) LINEAR;  Surgeon: Laurin Coder, MD;  [facility];  Service: Pulmonary;;   IR IMAGING GUIDED PORT INSERTION  03/03/2020   NO PAST SURGERIES     VIDEO BRONCHOSCOPY N/A 12/21/2019   Procedure: VIDEO BRONCHOSCOPY WITHOUT FLUORO;  Surgeon: Laurin Coder, MD;  [facility];  Service: Pulmonary;  Laterality: N/A;   Social History   Occupational History   Not on file  Tobacco Use   Smoking status: Former    Packs/day: 1.00    Years: 48.00    Pack years: 48.00    Types: Cigarettes    Start date: 08/07/1970    Quit date: 12/15/2019    Years since  quitting: 1.4   Smokeless tobacco: Never  Vaping Use   Vaping Use: Never used  Substance and Sexual Activity   Alcohol use: Not Currently   Drug use: Never   Sexual activity: Not on file    Comment: YES

## 2021-05-29 ENCOUNTER — Inpatient Hospital Stay: Payer: Medicare HMO | Attending: Internal Medicine

## 2021-05-29 ENCOUNTER — Inpatient Hospital Stay: Payer: Medicare HMO

## 2021-05-29 ENCOUNTER — Ambulatory Visit: Payer: Medicare HMO | Admitting: Internal Medicine

## 2021-05-29 ENCOUNTER — Inpatient Hospital Stay (HOSPITAL_BASED_OUTPATIENT_CLINIC_OR_DEPARTMENT_OTHER): Payer: Medicare HMO | Admitting: Internal Medicine

## 2021-05-29 ENCOUNTER — Other Ambulatory Visit: Payer: Self-pay

## 2021-05-29 VITALS — BP 149/88 | HR 96 | Temp 97.8°F | Resp 20 | Ht 73.0 in | Wt 200.9 lb

## 2021-05-29 DIAGNOSIS — Z95828 Presence of other vascular implants and grafts: Secondary | ICD-10-CM

## 2021-05-29 DIAGNOSIS — Z5112 Encounter for antineoplastic immunotherapy: Secondary | ICD-10-CM

## 2021-05-29 DIAGNOSIS — C3490 Malignant neoplasm of unspecified part of unspecified bronchus or lung: Secondary | ICD-10-CM | POA: Diagnosis present

## 2021-05-29 DIAGNOSIS — Z87891 Personal history of nicotine dependence: Secondary | ICD-10-CM | POA: Diagnosis not present

## 2021-05-29 DIAGNOSIS — I10 Essential (primary) hypertension: Secondary | ICD-10-CM | POA: Insufficient documentation

## 2021-05-29 DIAGNOSIS — Z923 Personal history of irradiation: Secondary | ICD-10-CM | POA: Diagnosis not present

## 2021-05-29 DIAGNOSIS — C7931 Secondary malignant neoplasm of brain: Secondary | ICD-10-CM | POA: Diagnosis present

## 2021-05-29 DIAGNOSIS — Z79899 Other long term (current) drug therapy: Secondary | ICD-10-CM | POA: Insufficient documentation

## 2021-05-29 DIAGNOSIS — C799 Secondary malignant neoplasm of unspecified site: Secondary | ICD-10-CM

## 2021-05-29 LAB — CBC WITH DIFFERENTIAL (CANCER CENTER ONLY)
Abs Immature Granulocytes: 0.04 10*3/uL (ref 0.00–0.07)
Basophils Absolute: 0.1 10*3/uL (ref 0.0–0.1)
Basophils Relative: 1 %
Eosinophils Absolute: 0.1 10*3/uL (ref 0.0–0.5)
Eosinophils Relative: 1 %
HCT: 38 % — ABNORMAL LOW (ref 39.0–52.0)
Hemoglobin: 13.2 g/dL (ref 13.0–17.0)
Immature Granulocytes: 1 %
Lymphocytes Relative: 27 %
Lymphs Abs: 2.2 10*3/uL (ref 0.7–4.0)
MCH: 30.6 pg (ref 26.0–34.0)
MCHC: 34.7 g/dL (ref 30.0–36.0)
MCV: 88 fL (ref 80.0–100.0)
Monocytes Absolute: 0.6 10*3/uL (ref 0.1–1.0)
Monocytes Relative: 7 %
Neutro Abs: 5.2 10*3/uL (ref 1.7–7.7)
Neutrophils Relative %: 63 %
Platelet Count: 347 10*3/uL (ref 150–400)
RBC: 4.32 MIL/uL (ref 4.22–5.81)
RDW: 14.4 % (ref 11.5–15.5)
WBC Count: 8.2 10*3/uL (ref 4.0–10.5)
nRBC: 0 % (ref 0.0–0.2)

## 2021-05-29 LAB — CMP (CANCER CENTER ONLY)
ALT: 20 U/L (ref 0–44)
AST: 19 U/L (ref 15–41)
Albumin: 4.4 g/dL (ref 3.5–5.0)
Alkaline Phosphatase: 62 U/L (ref 38–126)
Anion gap: 6 (ref 5–15)
BUN: 20 mg/dL (ref 8–23)
CO2: 28 mmol/L (ref 22–32)
Calcium: 9.1 mg/dL (ref 8.9–10.3)
Chloride: 106 mmol/L (ref 98–111)
Creatinine: 0.76 mg/dL (ref 0.61–1.24)
GFR, Estimated: >60 mL/min (ref >60)
Glucose, Bld: 153 mg/dL — ABNORMAL HIGH (ref 70–99)
Potassium: 3.6 mmol/L (ref 3.5–5.1)
Sodium: 140 mmol/L (ref 135–145)
Total Bilirubin: 0.7 mg/dL (ref 0.3–1.2)
Total Protein: 7.6 g/dL (ref 6.5–8.1)

## 2021-05-29 LAB — TSH: TSH: 0.959 u[IU]/mL (ref 0.320–4.118)

## 2021-05-29 MED ORDER — HEPARIN SOD (PORK) LOCK FLUSH 100 UNIT/ML IV SOLN
500.0000 [IU] | Freq: Once | INTRAVENOUS | Status: AC | PRN
  Administered 2021-05-29: 500 [IU]

## 2021-05-29 MED ORDER — SODIUM CHLORIDE 0.9 % IV SOLN: Freq: Once | INTRAVENOUS | Status: AC

## 2021-05-29 MED ORDER — SODIUM CHLORIDE 0.9% FLUSH
10.0000 mL | INTRAVENOUS | Status: DC | PRN
  Administered 2021-05-29: 10 mL

## 2021-05-29 MED ORDER — SODIUM CHLORIDE 0.9 % IV SOLN
350.0000 mg | Freq: Once | INTRAVENOUS | Status: AC
  Administered 2021-05-29: 350 mg via INTRAVENOUS
  Filled 2021-05-29: qty 7

## 2021-05-29 MED ORDER — SODIUM CHLORIDE 0.9% FLUSH
10.0000 mL | Freq: Once | INTRAVENOUS | Status: AC
  Administered 2021-05-29: 10 mL

## 2021-05-29 NOTE — Progress Notes (Signed)
[facility] [phone]   Fax:([phone]  OFFICE PROGRESS NOTE  [name], MD [place] [address]  DIAGNOSIS: stage IV (TX, N2, M1 C) non-small cell lung cancer, adenocarcinoma diagnosed in August 2021 and presented with solitary brain metastasis in addition to mediastinal lymphadenopathy.  PDL1 Expression 70%   Molecular Biomarkers:  Tumor Mutational Burden - 52 Muts/Mb Microsatellite status - MS-Stable Genomic Findings For a complete list of the genes assayed, please refer to the Appendix. NF1 E1694* MTAP loss exons 2-8 RICTOR amplification ATRX A419V BRAF K483E CDKN2A/B CDKN2A loss, CDKN2B loss DNMT3A E205* FGF10 amplification NTRK1 amplification - equivocal 7 Disease relevant genes with no reportable alterations: ALK, EGFR, ERBB2, KRAS, MET, RET, ROS1    PRIOR THERAPY:  1) Status post right craniotomy with tumor resection followed by [facility] to solitary brain metastasis under the care of Dr. Lisbeth Renshaw and Dr. Sherral Hammers. 2) Concurrent chemoradiation with weekly carboplatin for AUC of 2 and paclitaxel 45 mg/M2.  First dose 02/01/2020. Status post 5 cycles.  Last dose was given February 29, 2020.   CURRENT THERAPY:  First-line treatment with immunotherapy with Libtayo (Cempilimab) 350 mg IV every 3 weeks.  First dose April 25, 2020.  Status post 18 cycles.  INTERVAL HISTORY: Jared Shea. 66 y.o. male returns to the clinic today for follow-up visit.  The patient is feeling a little bit better today.  He was seen by orthopedic surgery and was found to have osteoarthritis of the knees.  He received steroid injection and feeling little bit better.  He denied having any current chest pain, shortness of breath, cough or hemoptysis.  He denied having any fever or chills.  He has no nausea, vomiting, diarrhea or constipation.  He has no headache or visual changes.  He is here today for evaluation before starting cycle #19 of his  treatment.  MEDICAL HISTORY: Past Medical History:  Diagnosis Date   Brain mass    Bursitis of right hip    Chest pain 08/12/2018   Chronic cough    Dependence on nicotine from cigarettes    Diabetes mellitus without complication ([place])    Essential hypertension 07/28/2018   nscl ca dx'd 12/2019   Seizures ([place])    Viral illness 03/23/2019    ALLERGIES:  is allergic to penicillins.  MEDICATIONS:  Current Outpatient Medications  Medication Sig Dispense Refill   HYDROcodone-acetaminophen (NORCO) 5-325 MG tablet Take 1 tablet by mouth every 6 (six) hours as needed for moderate pain. 30 tablet 0   ibuprofen (ADVIL) 800 MG tablet Take 800 mg by mouth 3 (three) times daily.     levETIRAcetam (KEPPRA) 750 MG tablet Take 1 tablet (750 mg total) by mouth 2 (two) times daily. 180 tablet 4   losartan (COZAAR) 25 MG tablet Take 1 tablet (25 mg total) by mouth daily. 30 tablet 11   meloxicam (MOBIC) 7.5 MG tablet Take 1 tablet (7.5 mg total) by mouth daily. 90 tablet 0   nortriptyline (PAMELOR) 25 MG capsule      omeprazole (PRILOSEC) 20 MG capsule Take 20 mg by mouth daily. Pt takes in the morning     No current facility-administered medications for this visit.    SURGICAL HISTORY:  Past Surgical History:  Procedure Laterality Date   APPLICATION OF CRANIAL NAVIGATION N/A 01/07/2020   Procedure: APPLICATION OF CRANIAL NAVIGATION;  Surgeon: Ashok Pall, MD;  Location: [facility];  Service: Neurosurgery;  Laterality: N/A;  CRANIOTOMY Right 01/07/2020   Procedure: RIGHT CRANIOTOMY FOR TUMOR RESECTION;  Surgeon: Ashok Pall, MD;  Location: [facility];  Service: Neurosurgery;  Laterality: Right;  rm 21   ENDOBRONCHIAL ULTRASOUND N/A 12/21/2019   Procedure: ENDOBRONCHIAL ULTRASOUND;  Surgeon: Laurin Coder, MD;  [facility];  Service: Pulmonary;  Laterality: N/A;   FINE NEEDLE ASPIRATION  12/21/2019   Procedure: FINE NEEDLE ASPIRATION (FNA) LINEAR;  Surgeon: Laurin Coder, MD;  Location:  [facility];  Service: Pulmonary;;   IR IMAGING GUIDED PORT INSERTION  03/03/2020   NO PAST SURGERIES     VIDEO BRONCHOSCOPY N/A 12/21/2019   Procedure: VIDEO BRONCHOSCOPY WITHOUT FLUORO;  Surgeon: Laurin Coder, MD;  [facility];  Service: Pulmonary;  Laterality: N/A;    REVIEW OF SYSTEMS:  A comprehensive review of systems was negative.   PHYSICAL EXAMINATION: General appearance: alert, cooperative, and no distress Head: Normocephalic, without obvious abnormality, atraumatic Neck: no adenopathy, no JVD, supple, symmetrical, trachea midline, and thyroid not enlarged, symmetric, no tenderness/mass/nodules Lymph nodes: Cervical, supraclavicular, and axillary nodes normal. Resp: clear to auscultation bilaterally Back: symmetric, no curvature. ROM normal. No CVA tenderness. Cardio: regular rate and rhythm, S1, S2 normal, no murmur, click, rub or gallop GI: soft, non-tender; bowel sounds normal; no masses,  no organomegaly Extremities: extremities normal, atraumatic, no cyanosis or edema  ECOG PERFORMANCE STATUS: 1 - Symptomatic but completely ambulatory  Blood pressure (!) 149/88, pulse 96, temperature 97.8 F (36.6 C), temperature source Tympanic, resp. rate 20, height '6\' 1"'  (1.854 m), weight 200 lb 14.4 oz (91.1 kg), SpO2 98 %.  LABORATORY DATA: Lab Results  Component Value Date   WBC 6.2 05/09/2021   HGB 12.4 (L) 05/09/2021   HCT 36.2 (L) 05/09/2021   MCV 87.7 05/09/2021   PLT 251 05/09/2021      Chemistry      Component Value Date/Time   NA 142 05/09/2021 0942   NA 140 11/23/2019 1516   K 3.3 (L) 05/09/2021 0942   CL 109 05/09/2021 0942   CO2 27 05/09/2021 0942   BUN 12 05/09/2021 0942   BUN 14 11/23/2019 1516   CREATININE 0.83 05/09/2021 0942      Component Value Date/Time   CALCIUM 8.9 05/09/2021 0942   ALKPHOS 69 05/09/2021 0942   AST 16 05/09/2021 0942   ALT 19 05/09/2021 0942   BILITOT 0.7 05/09/2021 0942       RADIOGRAPHIC STUDIES: XR  KNEE 3 VIEW LEFT  Result Date: 05/26/2021 Mild osteoarthritis.  Periarticular spurring.  Well-preserved joint spaces.  XR KNEE 3 VIEW RIGHT  Result Date: 05/26/2021 Mild osteoarthritis.  Periarticular spurring.  Well-preserved joint spaces.    ASSESSMENT AND PLAN: This is a very pleasant 66 years old African-American male recently diagnosed with a stage IV (TX, N2, M1c) non-small cell lung cancer, adenocarcinoma presented with solitary brain metastasis in addition to right hilar and mediastinal lymphadenopathy diagnosed in August 2021.  The patient is status post right craniotomy with resection of the solitary brain metastasis with SRS. His PET scan showed no evidence of metastatic disease outside the chest. The patient was treated with a course of concurrent chemoradiation with weekly carboplatin and paclitaxel status post 5 cycles.  He tolerated his treatment well except for dysphagia and odynophagia. His PD-L1 expression is 70%. The patient started treatment with immunotherapy with Cemiplimab 350 mg IV every 3 weeks status post 18 cycles.   The patient has been tolerating this treatment well with no concerning  adverse effect except for the arthralgia which is secondary to osteoarthritis and he was seen by orthopedic surgeon and received steroid injection to his knees.  He is feeling a little bit better. I recommended for the patient to proceed with cycle #19 today as planned. I will see him back for follow-up visit in 3 weeks for evaluation before starting cycle #20 with repeat CT scan of the chest, abdomen and pelvis for restaging of his disease. The patient was advised to call immediately if he has any other concerning symptoms in the interval. The patient voices understanding of current disease status and treatment options and is in agreement with the current care plan.  All questions were answered. The patient knows to call the clinic with any problems, questions or concerns. We can  certainly see the patient much sooner if necessary.   Disclaimer: This note was dictated with voice recognition software. Similar sounding words can inadvertently be transcribed and may not be corrected upon review.

## 2021-05-29 NOTE — Patient Instructions (Signed)
Cemiplimab injection What is this medication? CEMIPLIMAB (se mip li mab) is a monoclonal antibody. It treats certain types of cancer. Some of the cancers treated are cutaneous squamous cell carcinoma and basal cell carcinoma. This medicine may be used for other purposes; ask your health care provider or pharmacist if you have questions. COMMON BRAND NAME(S): LIBTAYO What should I tell my care team before I take this medication? They need to know if you have any of these conditions: autoimmune diseases like Crohn's disease, ulcerative colitis, or lupus have had or planning to have an allogeneic stem cell transplant (uses someone else's stem cells) history of organ transplant nervous system problems like myasthenia gravis or Guillain-Barre syndrome an unusual or allergic reaction to cemiplimab, other drugs, foods, dyes, or preservatives pregnant or trying to get pregnant breast-feeding How should I use this medication? This medicine is for infusion into a vein. It is given by a health care professional in a hospital or clinic setting. A special MedGuide will be given to you before each treatment. Be sure to read this information carefully each time. Talk to your pediatrician regarding the use of this medicine in children. Special care may be needed. Overdosage: If you think you have taken too much of this medicine contact a poison control center or emergency room at once. NOTE: This medicine is only for you. Do not share this medicine with others. What if I miss a dose? It is important not to miss your dose. Call your doctor or health care professional if you are unable to keep an appointment. What may interact with this medication? Interactions have not been studied. This list may not describe all possible interactions. Give your health care provider a list of all the medicines, herbs, non-prescription drugs, or dietary supplements you use. Also tell them if you smoke, drink alcohol, or use  illegal drugs. Some items may interact with your medicine. What should I watch for while using this medication? Your condition will be monitored carefully while you are receiving this medicine. You may need blood work done while you are taking this medicine. Do not become pregnant while taking this medicine or for at least 4 months after stopping it. Women should inform their doctor if they wish to become pregnant or think they might be pregnant. There is a potential for serious side effects to an unborn child. Talk to your health care professional or pharmacist for more information. Do not breast-feed an infant while taking this medicine or for at least 4 months after the last dose. What side effects may I notice from receiving this medication? Side effects that you should report to your doctor or health care professional as soon as possible: allergic reactions like skin rash, itching or hives; swelling of the face, lips, or tongue black, tarry stools bloody or watery diarrhea breathing problems changes in vision changes in voice chest pain or chest tightness chills cough dizziness fast or irregular heart beat feeling faint or lightheaded hair loss increased hunger or thirst muscle weakness persistent headache redness, blistering, peeling or loosening of the skin, including inside the mouth signs and symptoms of kidney injury like trouble passing urine or change in the amount of urine signs and symptoms of liver injury like dark yellow or brown urine; general ill feeling or flu-like symptoms; light-colored stools; loss of appetite; nausea; right upper belly pain; unusually weak or tired; yellowing of the eyes or skin stomach pain unusual bleeding or bruising weight gain or weight loss unusual sweating  Side effects that usually do not require medical attention (report these to your doctor or health care professional if they continue or are bothersome): bone pain constipation muscle  pain tiredness This list may not describe all possible side effects. Call your doctor for medical advice about side effects. You may report side effects to FDA at 1-800-FDA-1088. Where should I keep my medication? This drug is given in a hospital or clinic and will not be stored at home. NOTE: This sheet is a summary. It may not cover all possible information. If you have questions about this medicine, talk to your doctor, pharmacist, or health care provider.  2022 Elsevier/Gold Standard (2021-01-17 00:00:00)

## 2021-06-07 NOTE — Progress Notes (Signed)
[facility] OFFICE PROGRESS NOTE  [name], MD [place] [address]  DIAGNOSIS: Stage IV (TX, N2, M1 C) non-small cell lung cancer, adenocarcinoma diagnosed in August 2021 and presented with solitary brain metastasis in addition to mediastinal lymphadenopathy.   PDL1 Expression 70%   Molecular Biomarkers:  Tumor Mutational Burden - 52 Muts/Mb Microsatellite status - MS-Stable Genomic Findings For a complete list of the genes assayed, please refer to the Appendix. NF1 E1694* MTAP loss exons 2-8 RICTOR amplification ATRX A419V BRAF K483E CDKN2A/B CDKN2A loss, CDKN2B loss DNMT3A E205* FGF10 amplification NTRK1 amplification - equivocal 7 Disease relevant genes with no reportable alterations: ALK, EGFR, ERBB2, KRAS, MET, RET, ROS1     PRIOR THERAPY:  1) Status post right craniotomy with tumor resection followed by [facility] to solitary brain metastasis under the care of Dr. Lisbeth Renshaw and Dr. Sherral Hammers. 2) Concurrent chemoradiation with weekly carboplatin for AUC of 2 and paclitaxel 45 mg/M2.  First dose 02/01/2020. Status post 5 cycles.  Last dose was given February 29, 2020.  CURRENT THERAPY: First-line treatment with immunotherapy with Libtayo (Cempilimab) 350 mg IV every 3 weeks.  First dose April 25, 2020.  Status post 19 cycles.  INTERVAL HISTORY: Jared Shea. 66 y.o. male returns to the clinic today for a follow-up visit.  The patient is feeling fairly well today without any concerning complaints.  The patient has been having concerns related to arthralgias.  He was finally agreeable to seeing an orthopedic provider and he was found to have degenerative joint disease in his knees.  He received a joint injection with improvement in his symptoms.  The patient is presently taking Norco or ibuprofen for his knee pain.  He also uses Voltaren cream.   The patient has been having some ongoing memory deficits since undergoing his metastatic disease to  the brain treatment a few months ago.  Therefore, the patient is not reliable historian. He recently had a repeat brain MRI and is scheduled to see Dr. Mickeal Skinner today to review the results, which were stable. He denies any fever, chills, or night sweats. His weight and appetite is stable. He was previously prescribed Remeron for decreased appetite but he stopped taking it due to thinking it did not work.  He denies any changes with his breathing except sometimes while watching TV he may have spells of shortness of breath. He states the symptoms last about 10 minutes and he goes outside to get fresh air and the symptoms resolve. He denies associated wheezing, chest pain, lightheadedness, syncope, or palpitations. He denies cough.  Denies any hemoptysis or chest pain.  Denies any nausea, vomiting, diarrhea, or constipation.  Denies any rashes or skin changes.  The patient recently had a restaging CT scan performed.  He is here today for evaluation to review his scan results before starting cycle #20.    MEDICAL HISTORY: Past Medical History:  Diagnosis Date   Brain mass    Bursitis of right hip    Chest pain 08/12/2018   Chronic cough    Dependence on nicotine from cigarettes    Diabetes mellitus without complication ([place])    Essential hypertension 07/28/2018   nscl ca dx'd 12/2019   Seizures ([place])    Viral illness 03/23/2019    ALLERGIES:  is allergic to penicillins.  MEDICATIONS:  Current Outpatient Medications  Medication Sig Dispense Refill   albuterol (VENTOLIN HFA) 108 (90 Base) MCG/ACT inhaler Inhale 2 puffs into the lungs every 6 (  six) hours as needed for wheezing or shortness of breath. 8 g 2   HYDROcodone-acetaminophen (NORCO) 5-325 MG tablet Take 1 tablet by mouth every 6 (six) hours as needed for moderate pain. 30 tablet 0   ibuprofen (ADVIL) 800 MG tablet Take 800 mg by mouth 3 (three) times daily.     levETIRAcetam (KEPPRA) 750 MG tablet Take 1 tablet (750 mg total) by mouth 2 (two)  times daily. 180 tablet 4   losartan (COZAAR) 25 MG tablet Take 1 tablet (25 mg total) by mouth daily. 90 tablet 3   meloxicam (MOBIC) 7.5 MG tablet Take 1 tablet (7.5 mg total) by mouth daily. 90 tablet 0   nortriptyline (PAMELOR) 25 MG capsule      omeprazole (PRILOSEC) 20 MG capsule Take 20 mg by mouth daily. Pt takes in the morning     No current facility-administered medications for this visit.    SURGICAL HISTORY:  Past Surgical History:  Procedure Laterality Date   APPLICATION OF CRANIAL NAVIGATION N/A 01/07/2020   Procedure: APPLICATION OF CRANIAL NAVIGATION;  Surgeon: Ashok Pall, MD;  Location: [facility];  Service: Neurosurgery;  Laterality: N/A;   CRANIOTOMY Right 01/07/2020   Procedure: RIGHT CRANIOTOMY FOR TUMOR RESECTION;  Surgeon: Ashok Pall, MD;  Location: [facility];  Service: Neurosurgery;  Laterality: Right;  rm 21   ENDOBRONCHIAL ULTRASOUND N/A 12/21/2019   Procedure: ENDOBRONCHIAL ULTRASOUND;  Surgeon: Laurin Coder, MD;  Location: [facility];  Service: Pulmonary;  Laterality: N/A;   FINE NEEDLE ASPIRATION  12/21/2019   Procedure: FINE NEEDLE ASPIRATION (FNA) LINEAR;  Surgeon: Laurin Coder, MD;  Location: [facility];  Service: Pulmonary;;   IR IMAGING GUIDED PORT INSERTION  03/03/2020   NO PAST SURGERIES     VIDEO BRONCHOSCOPY N/A 12/21/2019   Procedure: VIDEO BRONCHOSCOPY WITHOUT FLUORO;  Surgeon: Laurin Coder, MD;  Location: [facility];  Service: Pulmonary;  Laterality: N/A;    REVIEW OF SYSTEMS:   Review of Systems  Constitutional: Positive for fatigue. Negative for appetite change, chills, and fever.  HENT: Negative for mouth sores, nosebleeds, sore throat and trouble swallowing.   Eyes: Positive for visual changes with bending over. Respiratory: Positive for intermittent spells of shortness of breath. Negative for hemoptysis and wheezing.   Cardiovascular: Negative for chest pain and leg swelling.  Gastrointestinal: Negative for abdominal pain,  constipation, diarrhea, nausea and vomiting.  Genitourinary: Negative for bladder incontinence, difficulty urinating, dysuria, frequency and hematuria.   Musculoskeletal: Positive for polyarthralgia, particularly in the knees (improved after having steroid joint infections).  Skin: Negative for itching and rash.  Neurological: Positive for occasional headaches lasting a few minutes. Negative for dizziness, extremity weakness, gait problem, light-headedness and seizures.  Hematological: Negative for adenopathy. Does not bruise/bleed easily.  Psychiatric/Behavioral: Positive for memory changes since undergoing treatment for metastatic disease to the brain. Negative for confusion, depression and sleep disturbance. The patient is not nervous/anxious.    PHYSICAL EXAMINATION:  Blood pressure 134/71, pulse 87, temperature 97.7 F (36.5 C), temperature source Axillary, resp. rate 18, height _0  (1.854 m), weight 200 lb 12.8 oz (91.1 kg), SpO2 100 %.  ECOG PERFORMANCE STATUS: 1 - Symptomatic but completely ambulatory  Physical Exam  Constitutional: Oriented to person, place, and time and well-developed, well-nourished, and in no distress. No distress.  HENT:  Head: Normocephalic and atraumatic.  Mouth/Throat: Oropharynx is clear and moist. No oropharyngeal exudate.  Eyes: Conjunctivae are normal. Right eye exhibits no discharge. Left eye exhibits no discharge.  No scleral icterus.  Neck: Normal range of motion. Neck supple.  Cardiovascular: Normal rate, regular rhythm, normal heart sounds and intact distal pulses.   Pulmonary/Chest: Effort normal and breath sounds normal. No respiratory distress. No wheezes. No rales.  Abdominal: Soft. Bowel sounds are normal. Exhibits no distension and no mass. There is no tenderness.  Musculoskeletal: Normal range of motion. Exhibits no edema.  Lymphadenopathy:    No cervical adenopathy.  Neurological: Alert and oriented to person, place, and time. Exhibits  normal muscle tone. Gait normal. Coordination normal.  Skin: Skin is warm and dry. No rash noted. Not diaphoretic. No erythema. No pallor.  Psychiatric: Mood, memory and judgment normal.  Vitals reviewed.  LABORATORY DATA: Lab Results  Component Value Date   WBC 5.5 06/19/2021   HGB 12.3 (L) 06/19/2021   HCT 36.1 (L) 06/19/2021   MCV 87.8 06/19/2021   PLT 296 06/19/2021      Chemistry      Component Value Date/Time   NA 142 06/19/2021 1010   NA 140 11/23/2019 1516   K 3.6 06/19/2021 1010   CL 107 06/19/2021 1010   CO2 28 06/19/2021 1010   BUN 16 06/19/2021 1010   BUN 14 11/23/2019 1516   CREATININE 0.75 06/19/2021 1010      Component Value Date/Time   CALCIUM 9.2 06/19/2021 1010   ALKPHOS 72 06/19/2021 1010   AST 20 06/19/2021 1010   ALT 27 06/19/2021 1010   BILITOT 0.5 06/19/2021 1010       RADIOGRAPHIC STUDIES:  CT Chest W Contrast  Result Date: 06/09/2021 CLINICAL DATA:  Primary Cancer Type: Lung Imaging Indication: Assess response to therapy Interval therapy since last imaging? Yes Initial Cancer Diagnosis Date: 12/21/2019; Established by: Biopsy-proven Detailed Pathology: Stage IV non-small cell lung cancer, adenocarcinoma. Primary Tumor location:  Left upper lobe. Surgeries: Right craniotomy for tumor resection. No thoracic. Chemotherapy: Yes; Ongoing? No; Most recent administration: 02/29/2020 Immunotherapy?  Yes; Type: Cempilimab; Ongoing? Yes Radiation therapy? Yes Date Range: 01/25/2020 - 03/14/2020; Target: Left lung Date Range: 04/22/2020; Target: Brain EXAM: CT CHEST, ABDOMEN, AND PELVIS WITH CONTRAST TECHNIQUE: Multidetector CT imaging of the chest, abdomen and pelvis was performed following the standard protocol during bolus administration of intravenous contrast. RADIATION DOSE REDUCTION: This exam was performed according to the departmental dose-optimization program which includes automated exposure control, adjustment of the mA and/or kV according to patient  size and/or use of iterative reconstruction technique. CONTRAST:  120m OMNIPAQUE IOHEXOL 300 MG/ML  SOLN COMPARISON:  04/11/2021 FINDINGS: CT CHEST FINDINGS Cardiovascular: No acute findings. Mediastinum/Lymph Nodes: No masses or pathologically enlarged lymph nodes identified. Lungs/Pleura: Stable scarring noted in the infrahilar and medial left lower lobe, without evidence of associated soft tissue mass. Stable scarring also seen in the upper paramediastinal lung zones bilaterally. No suspicious pulmonary nodules or masses identified. No evidence of infiltrate or pleural effusion. Mild emphysema again noted. Musculoskeletal:  No suspicious bone lesions identified. CT ABDOMEN AND PELVIS FINDINGS Hepatobiliary: No masses identified. Gallbladder is unremarkable. No evidence of biliary ductal dilatation. Pancreas:  No mass or inflammatory changes. Spleen:  Within normal limits in size and appearance. Adrenals/Urinary tract:  No masses or hydronephrosis. Stomach/Bowel: No evidence of obstruction, inflammatory process, or abnormal fluid collections. Normal appendix visualized. Diverticulosis is seen mainly involving the sigmoid colon, however there is no evidence of diverticulitis. Vascular/Lymphatic: 1.6 cm portacaval lymph node remains stable. No other pathologically enlarged lymph nodes identified. Aortic atherosclerotic calcification noted. No acute vascular findings. Reproductive:  No  mass or other significant abnormality identified. Other:  None. Musculoskeletal:  No suspicious bone lesions identified. IMPRESSION: Stable exam. No evidence of recurrent or metastatic carcinoma within the chest, abdomen, or pelvis. Mildly enlarged portacaval lymph node continues to remain stable. Recommend continued attention on follow-up imaging. Colonic diverticulosis, without radiographic evidence of diverticulitis. Aortic Atherosclerosis (ICD10-I70.0) and Emphysema (ICD10-J43.9). Electronically Signed   By: Marlaine Hind M.D.    On: 06/09/2021 11:16   MR Brain W Wo Contrast  Result Date: 06/19/2021 CLINICAL DATA:  Brain/CNS neoplasm, assess treatment response EXAM: MRI HEAD WITHOUT AND WITH CONTRAST TECHNIQUE: Multiplanar, multiecho pulse sequences of the brain and surrounding structures were obtained without and with intravenous contrast. CONTRAST:  9.23m GADAVIST GADOBUTROL 1 MMOL/ML IV SOLN COMPARISON:  03/05/2021 FINDINGS: Brain: Stable small area of gliosis and chronic blood products underlying the craniotomy along the right precentral gyrus. There is no acute infarction or intracranial hemorrhage. There is no intracranial mass, mass effect, or edema. There is no hydrocephalus or extra-axial fluid collection. Additional patchy foci of T2 hyperintensity in the supratentorial white matter probably reflect similar mild chronic microvascular ischemic changes. Ventricles and sulci are normal in size and configuration. No abnormal enhancement. Vascular: Major vessel flow voids at the skull base are preserved. Skull and upper cervical spine: Normal marrow signal is preserved. Sinuses/Orbits: Paranasal sinuses are aerated. Orbits are unremarkable. Other: Sella is unremarkable.  Mastoid air cells are clear. IMPRESSION: No evidence of recurrent disease. Electronically Signed   By: PMacy MisM.D.   On: 06/19/2021 08:25   CT Abdomen Pelvis W Contrast  Result Date: 06/09/2021 CLINICAL DATA:  Primary Cancer Type: Lung Imaging Indication: Assess response to therapy Interval therapy since last imaging? Yes Initial Cancer Diagnosis Date: 12/21/2019; Established by: Biopsy-proven Detailed Pathology: Stage IV non-small cell lung cancer, adenocarcinoma. Primary Tumor location:  Left upper lobe. Surgeries: Right craniotomy for tumor resection. No thoracic. Chemotherapy: Yes; Ongoing? No; Most recent administration: 02/29/2020 Immunotherapy?  Yes; Type: Cempilimab; Ongoing? Yes Radiation therapy? Yes Date Range: 01/25/2020 - 03/14/2020; Target:  Left lung Date Range: 04/22/2020; Target: Brain EXAM: CT CHEST, ABDOMEN, AND PELVIS WITH CONTRAST TECHNIQUE: Multidetector CT imaging of the chest, abdomen and pelvis was performed following the standard protocol during bolus administration of intravenous contrast. RADIATION DOSE REDUCTION: This exam was performed according to the departmental dose-optimization program which includes automated exposure control, adjustment of the mA and/or kV according to patient size and/or use of iterative reconstruction technique. CONTRAST:  1030mOMNIPAQUE IOHEXOL 300 MG/ML  SOLN COMPARISON:  04/11/2021 FINDINGS: CT CHEST FINDINGS Cardiovascular: No acute findings. Mediastinum/Lymph Nodes: No masses or pathologically enlarged lymph nodes identified. Lungs/Pleura: Stable scarring noted in the infrahilar and medial left lower lobe, without evidence of associated soft tissue mass. Stable scarring also seen in the upper paramediastinal lung zones bilaterally. No suspicious pulmonary nodules or masses identified. No evidence of infiltrate or pleural effusion. Mild emphysema again noted. Musculoskeletal:  No suspicious bone lesions identified. CT ABDOMEN AND PELVIS FINDINGS Hepatobiliary: No masses identified. Gallbladder is unremarkable. No evidence of biliary ductal dilatation. Pancreas:  No mass or inflammatory changes. Spleen:  Within normal limits in size and appearance. Adrenals/Urinary tract:  No masses or hydronephrosis. Stomach/Bowel: No evidence of obstruction, inflammatory process, or abnormal fluid collections. Normal appendix visualized. Diverticulosis is seen mainly involving the sigmoid colon, however there is no evidence of diverticulitis. Vascular/Lymphatic: 1.6 cm portacaval lymph node remains stable. No other pathologically enlarged lymph nodes identified. Aortic atherosclerotic calcification noted. No acute vascular  findings. Reproductive:  No mass or other significant abnormality identified. Other:  None.  Musculoskeletal:  No suspicious bone lesions identified. IMPRESSION: Stable exam. No evidence of recurrent or metastatic carcinoma within the chest, abdomen, or pelvis. Mildly enlarged portacaval lymph node continues to remain stable. Recommend continued attention on follow-up imaging. Colonic diverticulosis, without radiographic evidence of diverticulitis. Aortic Atherosclerosis (ICD10-I70.0) and Emphysema (ICD10-J43.9). Electronically Signed   By: Marlaine Hind M.D.   On: 06/09/2021 11:16   XR KNEE 3 VIEW LEFT  Result Date: 05/26/2021 Mild osteoarthritis.  Periarticular spurring.  Well-preserved joint spaces.  XR KNEE 3 VIEW RIGHT  Result Date: 05/26/2021 Mild osteoarthritis.  Periarticular spurring.  Well-preserved joint spaces.    ASSESSMENT/PLAN:  This is a very pleasant 66 year old African-American male diagnosed with stage IV (Tx, N2, M1c) non-small cell lung cancer, adenocarcinoma.  He presented with a solitary brain metastasis in addition to right hilar and mediastinal lymphadenopathy.  He was diagnosed in August 2021.     The patient is status post right craniotomy with resection of the solitary brain metastasis with SRS under the care of Dr. Lisbeth Renshaw and Dr. Christella Noa.    He completed weekly concurrent chemoradiation with carboplatin for an AUC of 2 and paclitaxel 45 mg per metered square.  He is status post 5 cycles.  He tolerated it well except for some dysphagia/odynophagia.  The patient then had evidence of new brain metastases.  He is status post SRS to the new lesions on 04/22/2020 under the care of Dr. Lisbeth Renshaw.   The patient is currently undergoing immunotherapy with Libtayo IV every 3 weeks.  He is status post 19 cycles and tolerated it well except for arthralgias.  Previously offered a break from treatment due to his arthralgias.  The patient followed up with orthopedics who found that the patient has degenerative joint disease in his knees.  The patient received steroid joint  injections and his symptoms improved.  The patient recently had a restaging CT scan performed.  Dr. Julien Nordmann personally and independently reviewed the scan and discussed the results with the patient today.  The scan showed no evidence for disease progression.  Dr. Julien Nordmann recommended the patient proceed with cycle #20 today scheduled.  We will see the patient back for follow-up visit in 3 weeks for evaluation before starting cycle #21.  He is scheduled to see neuro-oncology today to review his repeat brain MRI.   He will continue Norco and ibuprofen if needed. He is currently following with ortho for his degenerative joint disease.   For his intermittent self limiting episodes of shortness of breath, he states this occurs 2-3 times per week. This is not associated with exertion. His scan did not show any acute findings. Denies any associated symptoms. His symptoms resolve with fresh air. This may be anxiety related; however, I will prescribe him a rescue inhaler if needed for shortness of breath. He knows to use every 6 hours PRN for shortness of breath, although it sounds like he will only require using it a few times a week if needed.   The patient was advised to call immediately if he has any concerning symptoms in the interval. The patient voices understanding of current disease status and treatment options and is in agreement with the current care plan. All questions were answered. The patient knows to call the clinic with any problems, questions or concerns. We can certainly see the patient much sooner if necessary  No orders of the defined types were placed in  this encounter.     Jared Shea L Mark Benecke, PA-C 06/19/21  ADDENDUM: Hematology/Oncology Attending: I had a face-to-face encounter with the patient today.  I reviewed his record, lab, scan and recommended his care plan.  This is a very pleasant 66 years old African-American male diagnosed with a stage IV non-small cell lung  cancer, adenocarcinoma presented with solitary brain metastasis in addition to right hilar and mediastinal lymphadenopathy diagnosed in August 2021 status post right craniotomy with resection of the brain tumor followed by [facility].  The patient also underwent a course of concurrent chemoradiation to the locally advanced disease in the chest.  He has PD-L1 expression of 70% He is currently undergoing systemic treatment with immunotherapy with Libtayo (Cempilimab) every 3 weeks status post 19 cycles.  The patient has been tolerating this treatment well with no concerning adverse effect except for arthralgia that significantly improved after the patient received a steroid injection and he was found to have degenerative joint disease by his orthopedic surgeon. He had repeat CT scan of the chest, abdomen pelvis performed recently.  I personally and independently reviewed the scans and discussed the results with the patient today. His scan showed no concerning findings for disease progression. I recommended for the patient to continue his treatment with Libtayo (Cempilimab) with the same dose and schedule. I will see him back for follow-up visit in 3 weeks for evaluation before starting cycle #21. The patient was advised to call immediately if he has any concerning symptoms in the interval. The total time spent in the appointment was 35 minutes. Disclaimer: This note was dictated with voice recognition software. Similar sounding words can inadvertently be transcribed and may be missed upon review. Eilleen Kempf, MD 06/19/21

## 2021-06-09 ENCOUNTER — Ambulatory Visit (HOSPITAL_COMMUNITY)
Admission: RE | Admit: 2021-06-09 | Discharge: 2021-06-09 | Disposition: A | Discharge status: Home / Self Care | Payer: Medicare HMO | Source: Ambulatory Visit | Attending: Internal Medicine | Admitting: Internal Medicine

## 2021-06-09 ENCOUNTER — Other Ambulatory Visit: Payer: Self-pay

## 2021-06-09 ENCOUNTER — Encounter (HOSPITAL_COMMUNITY): Payer: Self-pay

## 2021-06-09 DIAGNOSIS — C3490 Malignant neoplasm of unspecified part of unspecified bronchus or lung: Secondary | ICD-10-CM | POA: Insufficient documentation

## 2021-06-09 MED ORDER — SODIUM CHLORIDE (PF) 0.9 % IJ SOLN
INTRAMUSCULAR | Status: AC
  Filled 2021-06-09: qty 50

## 2021-06-09 MED ORDER — IOHEXOL 300 MG/ML  SOLN
100.0000 mL | Freq: Once | INTRAMUSCULAR | Status: AC | PRN
  Administered 2021-06-09: 100 mL via INTRAVENOUS

## 2021-06-09 MED ORDER — HEPARIN SOD (PORK) LOCK FLUSH 100 UNIT/ML IV SOLN
500.0000 [IU] | Freq: Once | INTRAVENOUS | Status: AC
  Administered 2021-06-09: 500 [IU] via INTRAVENOUS

## 2021-06-09 MED ORDER — HEPARIN SOD (PORK) LOCK FLUSH 100 UNIT/ML IV SOLN
INTRAVENOUS | Status: AC
  Filled 2021-06-09: qty 5

## 2021-06-15 ENCOUNTER — Other Ambulatory Visit: Payer: Self-pay

## 2021-06-15 ENCOUNTER — Other Ambulatory Visit: Payer: Self-pay | Admitting: Radiation Therapy

## 2021-06-15 DIAGNOSIS — I10 Essential (primary) hypertension: Secondary | ICD-10-CM

## 2021-06-15 MED ORDER — LOSARTAN POTASSIUM 25 MG PO TABS: 25.0000 mg | ORAL_TABLET | Freq: Every day | ORAL | 3 refills | Status: DC

## 2021-06-16 ENCOUNTER — Ambulatory Visit (HOSPITAL_COMMUNITY): Admission: RE | Admit: 2021-06-16 | Payer: Medicare HMO | Source: Ambulatory Visit

## 2021-06-18 ENCOUNTER — Ambulatory Visit (HOSPITAL_COMMUNITY)
Admission: RE | Admit: 2021-06-18 | Discharge: 2021-06-18 | Disposition: A | Discharge status: Home / Self Care | Payer: Medicare HMO | Source: Ambulatory Visit | Attending: Internal Medicine | Admitting: Internal Medicine

## 2021-06-18 DIAGNOSIS — C7931 Secondary malignant neoplasm of brain: Secondary | ICD-10-CM | POA: Diagnosis present

## 2021-06-18 MED ORDER — GADOBUTROL 1 MMOL/ML IV SOLN
9.1000 mL | Freq: Once | INTRAVENOUS | Status: AC | PRN
  Administered 2021-06-18: 9.1 mL via INTRAVENOUS

## 2021-06-18 MED ORDER — CHLORHEXIDINE GLUCONATE CLOTH 2 % EX PADS
6.0000 | MEDICATED_PAD | Freq: Every day | CUTANEOUS | Status: DC
Start: 2021-06-18 — End: 2021-06-19

## 2021-06-18 MED ORDER — SODIUM CHLORIDE 0.9% FLUSH: 10.0000 mL | Freq: Two times a day (BID) | INTRAVENOUS | Status: DC

## 2021-06-18 MED ORDER — SODIUM CHLORIDE 0.9% FLUSH: 10.0000 mL | INTRAVENOUS | Status: DC | PRN

## 2021-06-18 MED ORDER — HEPARIN SOD (PORK) LOCK FLUSH 100 UNIT/ML IV SOLN
500.0000 [IU] | INTRAVENOUS | Status: AC | PRN
  Administered 2021-06-18: 500 [IU]

## 2021-06-19 ENCOUNTER — Inpatient Hospital Stay: Payer: Medicare HMO | Attending: Internal Medicine

## 2021-06-19 ENCOUNTER — Inpatient Hospital Stay: Payer: Medicare HMO

## 2021-06-19 ENCOUNTER — Inpatient Hospital Stay (HOSPITAL_BASED_OUTPATIENT_CLINIC_OR_DEPARTMENT_OTHER): Payer: Medicare HMO | Admitting: Physician Assistant

## 2021-06-19 ENCOUNTER — Other Ambulatory Visit: Payer: Self-pay

## 2021-06-19 ENCOUNTER — Inpatient Hospital Stay (HOSPITAL_BASED_OUTPATIENT_CLINIC_OR_DEPARTMENT_OTHER): Payer: Medicare HMO | Admitting: Internal Medicine

## 2021-06-19 VITALS — BP 134/71 | HR 87 | Temp 97.7°F | Resp 18 | Ht 73.0 in | Wt 200.8 lb

## 2021-06-19 DIAGNOSIS — I700 Atherosclerosis of aorta: Secondary | ICD-10-CM | POA: Insufficient documentation

## 2021-06-19 DIAGNOSIS — G40209 Localization-related (focal) (partial) symptomatic epilepsy and epileptic syndromes with complex partial seizures, not intractable, without status epilepticus: Secondary | ICD-10-CM

## 2021-06-19 DIAGNOSIS — C3490 Malignant neoplasm of unspecified part of unspecified bronchus or lung: Secondary | ICD-10-CM

## 2021-06-19 DIAGNOSIS — Z5112 Encounter for antineoplastic immunotherapy: Secondary | ICD-10-CM

## 2021-06-19 DIAGNOSIS — E119 Type 2 diabetes mellitus without complications: Secondary | ICD-10-CM | POA: Diagnosis not present

## 2021-06-19 DIAGNOSIS — C799 Secondary malignant neoplasm of unspecified site: Secondary | ICD-10-CM

## 2021-06-19 DIAGNOSIS — C7931 Secondary malignant neoplasm of brain: Secondary | ICD-10-CM

## 2021-06-19 DIAGNOSIS — Z79899 Other long term (current) drug therapy: Secondary | ICD-10-CM | POA: Diagnosis not present

## 2021-06-19 DIAGNOSIS — I10 Essential (primary) hypertension: Secondary | ICD-10-CM | POA: Diagnosis not present

## 2021-06-19 DIAGNOSIS — R0602 Shortness of breath: Secondary | ICD-10-CM | POA: Diagnosis not present

## 2021-06-19 DIAGNOSIS — Z9221 Personal history of antineoplastic chemotherapy: Secondary | ICD-10-CM | POA: Diagnosis not present

## 2021-06-19 DIAGNOSIS — J439 Emphysema, unspecified: Secondary | ICD-10-CM | POA: Insufficient documentation

## 2021-06-19 DIAGNOSIS — M170 Bilateral primary osteoarthritis of knee: Secondary | ICD-10-CM | POA: Insufficient documentation

## 2021-06-19 DIAGNOSIS — K5730 Diverticulosis of large intestine without perforation or abscess without bleeding: Secondary | ICD-10-CM | POA: Diagnosis not present

## 2021-06-19 DIAGNOSIS — Z87891 Personal history of nicotine dependence: Secondary | ICD-10-CM | POA: Insufficient documentation

## 2021-06-19 DIAGNOSIS — Z95828 Presence of other vascular implants and grafts: Secondary | ICD-10-CM

## 2021-06-19 DIAGNOSIS — Z791 Long term (current) use of non-steroidal anti-inflammatories (NSAID): Secondary | ICD-10-CM | POA: Diagnosis not present

## 2021-06-19 LAB — CMP (CANCER CENTER ONLY)
ALT: 27 U/L (ref 0–44)
AST: 20 U/L (ref 15–41)
Albumin: 4.3 g/dL (ref 3.5–5.0)
Alkaline Phosphatase: 72 U/L (ref 38–126)
Anion gap: 7 (ref 5–15)
BUN: 16 mg/dL (ref 8–23)
CO2: 28 mmol/L (ref 22–32)
Calcium: 9.2 mg/dL (ref 8.9–10.3)
Chloride: 107 mmol/L (ref 98–111)
Creatinine: 0.75 mg/dL (ref 0.61–1.24)
GFR, Estimated: >60 mL/min (ref >60)
Glucose, Bld: 100 mg/dL — ABNORMAL HIGH (ref 70–99)
Potassium: 3.6 mmol/L (ref 3.5–5.1)
Sodium: 142 mmol/L (ref 135–145)
Total Bilirubin: 0.5 mg/dL (ref 0.3–1.2)
Total Protein: 7.3 g/dL (ref 6.5–8.1)

## 2021-06-19 LAB — CBC WITH DIFFERENTIAL (CANCER CENTER ONLY)
Abs Immature Granulocytes: 0.02 10*3/uL (ref 0.00–0.07)
Basophils Absolute: 0 10*3/uL (ref 0.0–0.1)
Basophils Relative: 1 %
Eosinophils Absolute: 0.1 10*3/uL (ref 0.0–0.5)
Eosinophils Relative: 2 %
HCT: 36.1 % — ABNORMAL LOW (ref 39.0–52.0)
Hemoglobin: 12.3 g/dL — ABNORMAL LOW (ref 13.0–17.0)
Immature Granulocytes: 0 %
Lymphocytes Relative: 23 %
Lymphs Abs: 1.3 10*3/uL (ref 0.7–4.0)
MCH: 29.9 pg (ref 26.0–34.0)
MCHC: 34.1 g/dL (ref 30.0–36.0)
MCV: 87.8 fL (ref 80.0–100.0)
Monocytes Absolute: 0.4 10*3/uL (ref 0.1–1.0)
Monocytes Relative: 8 %
Neutro Abs: 3.6 10*3/uL (ref 1.7–7.7)
Neutrophils Relative %: 66 %
Platelet Count: 296 10*3/uL (ref 150–400)
RBC: 4.11 MIL/uL — ABNORMAL LOW (ref 4.22–5.81)
RDW: 13.4 % (ref 11.5–15.5)
WBC Count: 5.5 10*3/uL (ref 4.0–10.5)
nRBC: 0 % (ref 0.0–0.2)

## 2021-06-19 LAB — TSH: TSH: 1.092 u[IU]/mL (ref 0.320–4.118)

## 2021-06-19 MED ORDER — SODIUM CHLORIDE 0.9 % IV SOLN
350.0000 mg | Freq: Once | INTRAVENOUS | Status: AC
  Administered 2021-06-19: 350 mg via INTRAVENOUS
  Filled 2021-06-19: qty 7

## 2021-06-19 MED ORDER — SODIUM CHLORIDE 0.9% FLUSH
10.0000 mL | Freq: Once | INTRAVENOUS | Status: AC
  Administered 2021-06-19: 10 mL

## 2021-06-19 MED ORDER — ALBUTEROL SULFATE HFA 108 (90 BASE) MCG/ACT IN AERS: 2.0000 | INHALATION_SPRAY | Freq: Four times a day (QID) | RESPIRATORY_TRACT | 2 refills | Status: DC | PRN

## 2021-06-19 MED ORDER — SODIUM CHLORIDE 0.9 % IV SOLN: Freq: Once | INTRAVENOUS | Status: AC

## 2021-06-19 MED ORDER — SODIUM CHLORIDE 0.9% FLUSH
10.0000 mL | INTRAVENOUS | Status: DC | PRN
  Administered 2021-06-19: 10 mL

## 2021-06-19 MED ORDER — HEPARIN SOD (PORK) LOCK FLUSH 100 UNIT/ML IV SOLN
500.0000 [IU] | Freq: Once | INTRAVENOUS | Status: AC | PRN
  Administered 2021-06-19: 500 [IU]

## 2021-06-19 MED ORDER — LEVETIRACETAM 750 MG PO TABS: 750.0000 mg | ORAL_TABLET | Freq: Two times a day (BID) | ORAL | 4 refills | Status: DC

## 2021-06-19 NOTE — Progress Notes (Signed)
[facility] at [facility], [place] [address] [id number]  Interval Evaluation  Date of Service: 06/19/21 Patient Name: Jared Shea. Patient MRN: [id number] Patient DOB: 1956/05/01 Provider: Ventura Sellers, MD  Identifying Statement:  Jared Buckles. is a 66 y.o. male with Brain metastases [facility]) [C79.31]   Primary Cancer:  Oncologic History: Oncology History  Non-small cell carcinoma of lung, stage 4 w/ mets to brain ([place])  12/31/2019 Initial Diagnosis   Non-small cell carcinoma of lung, stage 4 w/ mets to brain ([place])   06/06/2020 Cancer Staging   Staging form: Lung, AJCC 8th Edition - Clinical: Stage IVB (cT0, cN2, cM1c) - Signed by Curt Bears, MD on 06/06/2020    Adenocarcinoma, metastatic ([place])  01/07/2020 Initial Diagnosis   Adenocarcinoma, metastatic ([place])   02/01/2020 - 03/07/2020 Chemotherapy   The patient had dexamethasone (DECADRON) 4 MG tablet, 8 mg, Oral, Daily, 1 of 1 cycle, Start date: --, End date: -- palonosetron (ALOXI) injection 0.25 mg, 0.25 mg, Intravenous,  Once, 5 of 7 cycles Administration: 0.25 mg (02/01/2020), 0.25 mg (02/08/2020), 0.25 mg (03/07/2020), 0.25 mg (02/15/2020), 0.25 mg (02/22/2020) CARBOplatin (PARAPLATIN) 220 mg in sodium chloride 0.9 % 250 mL chemo infusion, 220 mg (100 % of original dose 216.8 mg), Intravenous,  Once, 5 of 7 cycles Dose modification: 216.8 mg (original dose 216.8 mg, Cycle 1) Administration: 220 mg (02/01/2020), 220 mg (02/08/2020), 220 mg (03/07/2020), 220 mg (02/15/2020), 220 mg (02/22/2020) PACLitaxel (TAXOL) 90 mg in sodium chloride 0.9 % 250 mL chemo infusion (</= 80mg /m2), 45 mg/m2 = 90 mg, Intravenous,  Once, 5 of 7 cycles Administration: 90 mg (02/01/2020), 90 mg (02/08/2020), 90 mg (03/07/2020), 90 mg (02/15/2020), 90 mg (02/22/2020)   for chemotherapy treatment.     04/29/2020 -  Chemotherapy   Patient is on Treatment Plan : MELANOMA Cemiplimab q21d      CNS  Oncologic History 01/05/20: Pre-op SRS [facility]) 01/07/20: Craniotomy, R frontal resection ([place]) 04/22/20: Salvage SRS to 3 targets   Interval History:  Jared Shea. presents today for follow up after brain MRI.  No new or progressive changes, no further seizures.  Does describe forgetfulness, some issues with memory, not necessarily new.  Currently undergoing this immunotherapy treatment with Dr. Julien Nordmann since 04/29/20.  Headaches are stable at this time.  H+P (04/18/20) Patient presents following recent brain MRI.  He had completed pre-op SRS and underwent craniotomy for R frontal metastasis, after initial presenting with focal seizures (facial twitching).  He denies focal neurologic complaints today, he is functional and independent.  He is undergoing chemotherapy with Dr. Julien Nordmann (completed [place] and taxol) without complication.  His gait is unassisted.  Denies any seizures following his initial presentation, compliant with Keppra.      Medications: Current Outpatient Medications on File Prior to Visit  Medication Sig Dispense Refill   albuterol (VENTOLIN HFA) 108 (90 Base) MCG/ACT inhaler Inhale 2 puffs into the lungs every 6 (six) hours as needed for wheezing or shortness of breath. 8 g 2   HYDROcodone-acetaminophen (NORCO) 5-325 MG tablet Take 1 tablet by mouth every 6 (six) hours as needed for moderate pain. 30 tablet 0   ibuprofen (ADVIL) 800 MG tablet Take 800 mg by mouth 3 (three) times daily.     levETIRAcetam (KEPPRA) 750 MG tablet Take 1 tablet (750 mg total) by mouth 2 (two) times daily. 180 tablet 4   losartan (COZAAR) 25 MG tablet Take 1 tablet (25  mg total) by mouth daily. 90 tablet 3   meloxicam (MOBIC) 7.5 MG tablet Take 1 tablet (7.5 mg total) by mouth daily. 90 tablet 0   nortriptyline (PAMELOR) 25 MG capsule      omeprazole (PRILOSEC) 20 MG capsule Take 20 mg by mouth daily. Pt takes in the morning     No current facility-administered medications on file prior to  visit.    Allergies:  Allergies  Allergen Reactions   Penicillins Rash    Did it involve swelling of the face/tongue/throat, SOB, or low BP? N Did it involve sudden or severe rash/hives, skin peeling, or any reaction on the inside of your mouth or nose? Y Did you need to seek medical attention at a hospital or doctor's office? Y When did it last happen?    childhood   If all above answers are NO, may proceed with cephalosporin use.    Past Medical History:  Past Medical History:  Diagnosis Date   Brain mass    Bursitis of right hip    Chest pain 08/12/2018   Chronic cough    Dependence on nicotine from cigarettes    Diabetes mellitus without complication ([place])    Essential hypertension 07/28/2018   nscl ca dx'd 12/2019   Seizures ([place])    Viral illness 03/23/2019   Past Surgical History:  Past Surgical History:  Procedure Laterality Date   APPLICATION OF CRANIAL NAVIGATION N/A 01/07/2020   Procedure: APPLICATION OF CRANIAL NAVIGATION;  Surgeon: Ashok Pall, MD;  Location: [facility];  Service: Neurosurgery;  Laterality: N/A;   CRANIOTOMY Right 01/07/2020   Procedure: RIGHT CRANIOTOMY FOR TUMOR RESECTION;  Surgeon: Ashok Pall, MD;  Location: [facility];  Service: Neurosurgery;  Laterality: Right;  rm 21   ENDOBRONCHIAL ULTRASOUND N/A 12/21/2019   Procedure: ENDOBRONCHIAL ULTRASOUND;  Surgeon: Laurin Coder, MD;  Location: [facility];  Service: Pulmonary;  Laterality: N/A;   FINE NEEDLE ASPIRATION  12/21/2019   Procedure: FINE NEEDLE ASPIRATION (FNA) LINEAR;  Surgeon: Laurin Coder, MD;  Location: [facility];  Service: Pulmonary;;   IR IMAGING GUIDED PORT INSERTION  03/03/2020   NO PAST SURGERIES     VIDEO BRONCHOSCOPY N/A 12/21/2019   Procedure: VIDEO BRONCHOSCOPY WITHOUT FLUORO;  Surgeon: Laurin Coder, MD;  Location: [facility];  Service: Pulmonary;  Laterality: N/A;   Social History:  Social History   Socioeconomic History   Marital status: Divorced    Spouse  name: Not on file   Number of children: Not on file   Years of education: Not on file   Highest education level: Not on file  Occupational History   Not on file  Tobacco Use   Smoking status: Former    Packs/day: 1.00    Years: 48.00    Pack years: 48.00    Types: Cigarettes    Start date: 08/07/1970    Quit date: 12/15/2019    Years since quitting: 1.5   Smokeless tobacco: Never  Vaping Use   Vaping Use: Never used  Substance and Sexual Activity   Alcohol use: Not Currently   Drug use: Never   Sexual activity: Not on file    Comment: YES  Other Topics Concern   Not on file  Social History Narrative   Not on file   Social Determinants of Health   Financial Resource Strain: Not on file  Food Insecurity: Not on file  Transportation Needs: Not on file  Physical Activity: Not on file  Stress: Not  on file  Social Connections: Not on file  Intimate Partner Violence: Not on file   Family History:  Family History  Problem Relation Age of Onset   Heart disease Mother        CABG   Kidney disease Sister    Diabetes Brother    Kidney disease Brother        KIDNEY TRANSPLANT   Hypertension Sister    Healthy Daughter    Healthy Daughter    Healthy Son     Review of Systems: Constitutional: Doesn't report fevers, chills or abnormal weight loss Eyes: Doesn't report blurriness of vision Ears, nose, mouth, throat, and face: Doesn't report sore throat Respiratory: Doesn't report cough, dyspnea or wheezes Cardiovascular: Doesn't report palpitation, chest discomfort  Gastrointestinal:  Doesn't report nausea, constipation, diarrhea GU: Doesn't report incontinence Skin: Doesn't report skin rashes Neurological: Per HPI Musculoskeletal: diffuse joint pain Behavioral/Psych: Doesn't report anxiety  Physical Exam: Wt Readings from Last 3 Encounters:  06/19/21 200 lb 12.8 oz (91.1 kg)  05/29/21 200 lb 14.4 oz (91.1 kg)  05/09/21 200 lb 6.4 oz (90.9 kg)   Temp Readings from  Last 3 Encounters:  06/19/21 97.7 F (36.5 C) (Axillary)  05/29/21 97.8 F (36.6 C) (Tympanic)  05/09/21 97.8 F (36.6 C) (Tympanic)   BP Readings from Last 3 Encounters:  06/19/21 134/71  05/29/21 (!) 149/88  05/09/21 114/83   Pulse Readings from Last 3 Encounters:  06/19/21 87  05/29/21 96  05/09/21 88    KPS: 80. General: Alert, cooperative, pleasant, in no acute distress Head: Normal EENT: No conjunctival injection or scleral icterus.  Lungs: Resp effort normal Cardiac: Regular rate Abdomen: Non-distended abdomen Skin: No rashes cyanosis or petechiae. Extremities: No clubbing or edema  Neurologic Exam: Mental Status: Awake, alert, attentive to examiner. Oriented to self and environment. Language is fluent with intact comprehension.  Cranial Nerves: Visual acuity is grossly normal. Visual fields are full. Extra-ocular movements intact. No ptosis. Face is symmetric Motor: Tone and bulk are normal. Power is full in both arms and legs. Reflexes are symmetric, no pathologic reflexes present.  Sensory: Intact to light touch Gait: Normal.   Labs: I have reviewed the data as listed    Component Value Date/Time   NA 142 06/19/2021 1010   NA 140 11/23/2019 1516   K 3.6 06/19/2021 1010   CL 107 06/19/2021 1010   CO2 28 06/19/2021 1010   GLUCOSE 100 (H) 06/19/2021 1010   BUN 16 06/19/2021 1010   BUN 14 11/23/2019 1516   CREATININE 0.75 06/19/2021 1010   CALCIUM 9.2 06/19/2021 1010   PROT 7.3 06/19/2021 1010   ALBUMIN 4.3 06/19/2021 1010   AST 20 06/19/2021 1010   ALT 27 06/19/2021 1010   ALKPHOS 72 06/19/2021 1010   BILITOT 0.5 06/19/2021 1010   GFRNONAA >60 06/19/2021 1010   GFRAA >60 02/15/2020 1000   Lab Results  Component Value Date   WBC 5.5 06/19/2021   NEUTROABS 3.6 06/19/2021   HGB 12.3 (L) 06/19/2021   HCT 36.1 (L) 06/19/2021   MCV 87.8 06/19/2021   PLT 296 06/19/2021    Imaging:  [place] Clinician Interpretation: I have personally reviewed the  CNS images as listed.  My interpretation, in the context of the patient's clinical presentation, is stable disease  CT Chest W Contrast  Result Date: 06/09/2021 CLINICAL DATA:  Primary Cancer Type: Lung Imaging Indication: Assess response to therapy Interval therapy since last imaging? Yes Initial Cancer Diagnosis Date: 12/21/2019;  Established by: Biopsy-proven Detailed Pathology: Stage IV non-small cell lung cancer, adenocarcinoma. Primary Tumor location:  Left upper lobe. Surgeries: Right craniotomy for tumor resection. No thoracic. Chemotherapy: Yes; Ongoing? No; Most recent administration: 02/29/2020 Immunotherapy?  Yes; Type: Cempilimab; Ongoing? Yes Radiation therapy? Yes Date Range: 01/25/2020 - 03/14/2020; Target: Left lung Date Range: 04/22/2020; Target: Brain EXAM: CT CHEST, ABDOMEN, AND PELVIS WITH CONTRAST TECHNIQUE: Multidetector CT imaging of the chest, abdomen and pelvis was performed following the standard protocol during bolus administration of intravenous contrast. RADIATION DOSE REDUCTION: This exam was performed according to the departmental dose-optimization program which includes automated exposure control, adjustment of the mA and/or kV according to patient size and/or use of iterative reconstruction technique. CONTRAST:  162mL OMNIPAQUE IOHEXOL 300 MG/ML  SOLN COMPARISON:  04/11/2021 FINDINGS: CT CHEST FINDINGS Cardiovascular: No acute findings. Mediastinum/Lymph Nodes: No masses or pathologically enlarged lymph nodes identified. Lungs/Pleura: Stable scarring noted in the infrahilar and medial left lower lobe, without evidence of associated soft tissue mass. Stable scarring also seen in the upper paramediastinal lung zones bilaterally. No suspicious pulmonary nodules or masses identified. No evidence of infiltrate or pleural effusion. Mild emphysema again noted. Musculoskeletal:  No suspicious bone lesions identified. CT ABDOMEN AND PELVIS FINDINGS Hepatobiliary: No masses identified.  Gallbladder is unremarkable. No evidence of biliary ductal dilatation. Pancreas:  No mass or inflammatory changes. Spleen:  Within normal limits in size and appearance. Adrenals/Urinary tract:  No masses or hydronephrosis. Stomach/Bowel: No evidence of obstruction, inflammatory process, or abnormal fluid collections. Normal appendix visualized. Diverticulosis is seen mainly involving the sigmoid colon, however there is no evidence of diverticulitis. Vascular/Lymphatic: 1.6 cm portacaval lymph node remains stable. No other pathologically enlarged lymph nodes identified. Aortic atherosclerotic calcification noted. No acute vascular findings. Reproductive:  No mass or other significant abnormality identified. Other:  None. Musculoskeletal:  No suspicious bone lesions identified. IMPRESSION: Stable exam. No evidence of recurrent or metastatic carcinoma within the chest, abdomen, or pelvis. Mildly enlarged portacaval lymph node continues to remain stable. Recommend continued attention on follow-up imaging. Colonic diverticulosis, without radiographic evidence of diverticulitis. Aortic Atherosclerosis (ICD10-I70.0) and Emphysema (ICD10-J43.9). Electronically Signed   By: Marlaine Hind M.D.   On: 06/09/2021 11:16   MR Brain W Wo Contrast  Result Date: 06/19/2021 CLINICAL DATA:  Brain/CNS neoplasm, assess treatment response EXAM: MRI HEAD WITHOUT AND WITH CONTRAST TECHNIQUE: Multiplanar, multiecho pulse sequences of the brain and surrounding structures were obtained without and with intravenous contrast. CONTRAST:  9.40mL GADAVIST GADOBUTROL 1 MMOL/ML IV SOLN COMPARISON:  03/05/2021 FINDINGS: Brain: Stable small area of gliosis and chronic blood products underlying the craniotomy along the right precentral gyrus. There is no acute infarction or intracranial hemorrhage. There is no intracranial mass, mass effect, or edema. There is no hydrocephalus or extra-axial fluid collection. Additional patchy foci of T2 hyperintensity  in the supratentorial white matter probably reflect similar mild chronic microvascular ischemic changes. Ventricles and sulci are normal in size and configuration. No abnormal enhancement. Vascular: Major vessel flow voids at the skull base are preserved. Skull and upper cervical spine: Normal marrow signal is preserved. Sinuses/Orbits: Paranasal sinuses are aerated. Orbits are unremarkable. Other: Sella is unremarkable.  Mastoid air cells are clear. IMPRESSION: No evidence of recurrent disease. Electronically Signed   By: Macy Mis M.D.   On: 06/19/2021 08:25   CT Abdomen Pelvis W Contrast  Result Date: 06/09/2021 CLINICAL DATA:  Primary Cancer Type: Lung Imaging Indication: Assess response to therapy Interval therapy since last imaging? Yes Initial  Cancer Diagnosis Date: 12/21/2019; Established by: Biopsy-proven Detailed Pathology: Stage IV non-small cell lung cancer, adenocarcinoma. Primary Tumor location:  Left upper lobe. Surgeries: Right craniotomy for tumor resection. No thoracic. Chemotherapy: Yes; Ongoing? No; Most recent administration: 02/29/2020 Immunotherapy?  Yes; Type: Cempilimab; Ongoing? Yes Radiation therapy? Yes Date Range: 01/25/2020 - 03/14/2020; Target: Left lung Date Range: 04/22/2020; Target: Brain EXAM: CT CHEST, ABDOMEN, AND PELVIS WITH CONTRAST TECHNIQUE: Multidetector CT imaging of the chest, abdomen and pelvis was performed following the standard protocol during bolus administration of intravenous contrast. RADIATION DOSE REDUCTION: This exam was performed according to the departmental dose-optimization program which includes automated exposure control, adjustment of the mA and/or kV according to patient size and/or use of iterative reconstruction technique. CONTRAST:  173mL OMNIPAQUE IOHEXOL 300 MG/ML  SOLN COMPARISON:  04/11/2021 FINDINGS: CT CHEST FINDINGS Cardiovascular: No acute findings. Mediastinum/Lymph Nodes: No masses or pathologically enlarged lymph nodes identified.  Lungs/Pleura: Stable scarring noted in the infrahilar and medial left lower lobe, without evidence of associated soft tissue mass. Stable scarring also seen in the upper paramediastinal lung zones bilaterally. No suspicious pulmonary nodules or masses identified. No evidence of infiltrate or pleural effusion. Mild emphysema again noted. Musculoskeletal:  No suspicious bone lesions identified. CT ABDOMEN AND PELVIS FINDINGS Hepatobiliary: No masses identified. Gallbladder is unremarkable. No evidence of biliary ductal dilatation. Pancreas:  No mass or inflammatory changes. Spleen:  Within normal limits in size and appearance. Adrenals/Urinary tract:  No masses or hydronephrosis. Stomach/Bowel: No evidence of obstruction, inflammatory process, or abnormal fluid collections. Normal appendix visualized. Diverticulosis is seen mainly involving the sigmoid colon, however there is no evidence of diverticulitis. Vascular/Lymphatic: 1.6 cm portacaval lymph node remains stable. No other pathologically enlarged lymph nodes identified. Aortic atherosclerotic calcification noted. No acute vascular findings. Reproductive:  No mass or other significant abnormality identified. Other:  None. Musculoskeletal:  No suspicious bone lesions identified. IMPRESSION: Stable exam. No evidence of recurrent or metastatic carcinoma within the chest, abdomen, or pelvis. Mildly enlarged portacaval lymph node continues to remain stable. Recommend continued attention on follow-up imaging. Colonic diverticulosis, without radiographic evidence of diverticulitis. Aortic Atherosclerosis (ICD10-I70.0) and Emphysema (ICD10-J43.9). Electronically Signed   By: Marlaine Hind M.D.   On: 06/09/2021 11:16   XR KNEE 3 VIEW LEFT  Result Date: 05/26/2021 Mild osteoarthritis.  Periarticular spurring.  Well-preserved joint spaces.  XR KNEE 3 VIEW RIGHT  Result Date: 05/26/2021 Mild osteoarthritis.  Periarticular spurring.  Well-preserved joint spaces.     Assessment/Plan Brain metastases ([place]) [C79.31]  Jared Simmonds. is clinically and radiographically stable today.  No new or progressive neurologic deficits.  Can con't Pamelor for headaches, Keppra for seizure prevention.  Continues on [place] immunotherapy with Dr. Julien Nordmann.  We appreciate the opportunity to participate in the care of Jared Halm..    We ask that Jared Simmonds. return to clinic in 4 months following next brain MRI, or sooner as needed.  All questions were answered. The patient knows to call the clinic with any problems, questions or concerns. No barriers to learning were detected.  The total time spent in the encounter was 30 minutes and more than 50% was on counseling and review of test results   Ventura Sellers, MD Medical Director of Neuro-Oncology [facility] 06/19/21 11:26 AM

## 2021-06-20 ENCOUNTER — Telehealth: Payer: Self-pay | Admitting: Internal Medicine

## 2021-06-20 NOTE — Telephone Encounter (Signed)
Scheduled per 2/6 los, pt has been called and confirmed

## 2021-06-22 ENCOUNTER — Other Ambulatory Visit: Payer: Self-pay | Admitting: Radiation Therapy

## 2021-06-29 ENCOUNTER — Other Ambulatory Visit: Payer: Self-pay | Admitting: Neurology

## 2021-07-04 ENCOUNTER — Telehealth: Payer: Self-pay | Admitting: *Deleted

## 2021-07-04 NOTE — Telephone Encounter (Signed)
Ansay with [facility] called in to schedule appt for patient. States he's having right sided chest discomfort that they think is MSK in nature. Has Port-a-cath on right chest but does not have s/sx of infection. First available appt given for 2/28 at 0945 with PCP.

## 2021-07-10 ENCOUNTER — Inpatient Hospital Stay: Payer: Medicare HMO

## 2021-07-10 ENCOUNTER — Inpatient Hospital Stay (HOSPITAL_BASED_OUTPATIENT_CLINIC_OR_DEPARTMENT_OTHER): Payer: Medicare HMO | Admitting: Internal Medicine

## 2021-07-10 ENCOUNTER — Other Ambulatory Visit: Payer: Self-pay

## 2021-07-10 ENCOUNTER — Encounter: Payer: Self-pay | Admitting: Internal Medicine

## 2021-07-10 VITALS — BP 153/85 | HR 87 | Temp 97.5°F | Resp 18 | Ht 73.0 in | Wt 195.6 lb

## 2021-07-10 DIAGNOSIS — Z95828 Presence of other vascular implants and grafts: Secondary | ICD-10-CM

## 2021-07-10 DIAGNOSIS — C3490 Malignant neoplasm of unspecified part of unspecified bronchus or lung: Secondary | ICD-10-CM | POA: Diagnosis not present

## 2021-07-10 DIAGNOSIS — Z5112 Encounter for antineoplastic immunotherapy: Secondary | ICD-10-CM | POA: Diagnosis not present

## 2021-07-10 DIAGNOSIS — C799 Secondary malignant neoplasm of unspecified site: Secondary | ICD-10-CM

## 2021-07-10 DIAGNOSIS — C7931 Secondary malignant neoplasm of brain: Secondary | ICD-10-CM | POA: Diagnosis not present

## 2021-07-10 LAB — CBC WITH DIFFERENTIAL (CANCER CENTER ONLY)
Abs Immature Granulocytes: 0.02 10*3/uL (ref 0.00–0.07)
Basophils Absolute: 0 10*3/uL (ref 0.0–0.1)
Basophils Relative: 1 %
Eosinophils Absolute: 0.1 10*3/uL (ref 0.0–0.5)
Eosinophils Relative: 1 %
HCT: 38.3 % — ABNORMAL LOW (ref 39.0–52.0)
Hemoglobin: 13.3 g/dL (ref 13.0–17.0)
Immature Granulocytes: 0 %
Lymphocytes Relative: 24 %
Lymphs Abs: 1.5 10*3/uL (ref 0.7–4.0)
MCH: 30.2 pg (ref 26.0–34.0)
MCHC: 34.7 g/dL (ref 30.0–36.0)
MCV: 86.8 fL (ref 80.0–100.0)
Monocytes Absolute: 0.5 10*3/uL (ref 0.1–1.0)
Monocytes Relative: 9 %
Neutro Abs: 4 10*3/uL (ref 1.7–7.7)
Neutrophils Relative %: 65 %
Platelet Count: 307 10*3/uL (ref 150–400)
RBC: 4.41 MIL/uL (ref 4.22–5.81)
RDW: 13.3 % (ref 11.5–15.5)
WBC Count: 6.1 10*3/uL (ref 4.0–10.5)
nRBC: 0 % (ref 0.0–0.2)

## 2021-07-10 LAB — CMP (CANCER CENTER ONLY)
ALT: 27 U/L (ref 0–44)
AST: 20 U/L (ref 15–41)
Albumin: 4.4 g/dL (ref 3.5–5.0)
Alkaline Phosphatase: 72 U/L (ref 38–126)
Anion gap: 6 (ref 5–15)
BUN: 12 mg/dL (ref 8–23)
CO2: 28 mmol/L (ref 22–32)
Calcium: 9.2 mg/dL (ref 8.9–10.3)
Chloride: 108 mmol/L (ref 98–111)
Creatinine: 0.83 mg/dL (ref 0.61–1.24)
GFR, Estimated: >60 mL/min (ref >60)
Glucose, Bld: 139 mg/dL — ABNORMAL HIGH (ref 70–99)
Potassium: 3.8 mmol/L (ref 3.5–5.1)
Sodium: 142 mmol/L (ref 135–145)
Total Bilirubin: 0.6 mg/dL (ref 0.3–1.2)
Total Protein: 7.8 g/dL (ref 6.5–8.1)

## 2021-07-10 LAB — TSH: TSH: 0.89 u[IU]/mL (ref 0.320–4.118)

## 2021-07-10 MED ORDER — SODIUM CHLORIDE 0.9% FLUSH
10.0000 mL | INTRAVENOUS | Status: DC | PRN
  Administered 2021-07-10: 10 mL

## 2021-07-10 MED ORDER — HEPARIN SOD (PORK) LOCK FLUSH 100 UNIT/ML IV SOLN
500.0000 [IU] | Freq: Once | INTRAVENOUS | Status: AC | PRN
  Administered 2021-07-10: 500 [IU]

## 2021-07-10 MED ORDER — SODIUM CHLORIDE 0.9 % IV SOLN
350.0000 mg | Freq: Once | INTRAVENOUS | Status: AC
  Administered 2021-07-10: 350 mg via INTRAVENOUS
  Filled 2021-07-10: qty 7

## 2021-07-10 MED ORDER — SODIUM CHLORIDE 0.9% FLUSH
10.0000 mL | Freq: Once | INTRAVENOUS | Status: AC
  Administered 2021-07-10: 10 mL

## 2021-07-10 MED ORDER — SODIUM CHLORIDE 0.9 % IV SOLN: Freq: Once | INTRAVENOUS | Status: AC

## 2021-07-10 NOTE — Progress Notes (Signed)
[facility] [phone]   [phone]  OFFICE PROGRESS NOTE  [name], MD [place] [address]  DIAGNOSIS: stage IV (TX, N2, M1 C) non-small cell lung cancer, adenocarcinoma diagnosed in August 2021 and presented with solitary brain metastasis in addition to mediastinal lymphadenopathy.  PDL1 Expression 70%   Molecular Biomarkers:  Tumor Mutational Burden - 52 Muts/Mb Microsatellite status - MS-Stable Genomic Findings For a complete list of the genes assayed, please refer to the Appendix. NF1 E1694* MTAP loss exons 2-8 RICTOR amplification ATRX A419V BRAF K483E CDKN2A/B CDKN2A loss, CDKN2B loss DNMT3A E205* FGF10 amplification NTRK1 amplification - equivocal 7 Disease relevant genes with no reportable alterations: ALK, EGFR, ERBB2, KRAS, MET, RET, ROS1    PRIOR THERAPY:  1) Status post right craniotomy with tumor resection followed by [facility] to solitary brain metastasis under the care of Dr. Lisbeth Renshaw and Dr. Sherral Hammers. 2) Concurrent chemoradiation with weekly carboplatin for AUC of 2 and paclitaxel 45 mg/M2.  First dose 02/01/2020. Status post 5 cycles.  Last dose was given February 29, 2020.   CURRENT THERAPY:  First-line treatment with immunotherapy with Libtayo (Cempilimab) 350 mg IV every 3 weeks.  First dose April 25, 2020.  Status post 20 cycles.  INTERVAL HISTORY: Jared Shea. 66 y.o. male returns to the clinic today for follow-up visit.  The patient continues to complain of arthralgia especially in the knees area.  He denied having any current chest pain, shortness of breath, cough or hemoptysis.  He denied having any fever or chills.  He has no nausea, vomiting, diarrhea or constipation.  He has no headache or visual changes.  He denied having any fever or chills.  He continues to tolerate his treatment with Libtayo (Cempilimab) fairly well.  He is here today for evaluation before starting cycle  #21.  MEDICAL HISTORY: Past Medical History:  Diagnosis Date   Brain mass    Bursitis of right hip    Chest pain 08/12/2018   Chronic cough    Dependence on nicotine from cigarettes    Diabetes mellitus without complication ([place])    Essential hypertension 07/28/2018   nscl ca dx'd 12/2019   Seizures ([place])    Viral illness 03/23/2019    ALLERGIES:  is allergic to penicillins.  MEDICATIONS:  Current Outpatient Medications  Medication Sig Dispense Refill   albuterol (VENTOLIN HFA) 108 (90 Base) MCG/ACT inhaler Inhale 2 puffs into the lungs every 6 (six) hours as needed for wheezing or shortness of breath. 8 g 2   HYDROcodone-acetaminophen (NORCO) 5-325 MG tablet Take 1 tablet by mouth every 6 (six) hours as needed for moderate pain. 30 tablet 0   ibuprofen (ADVIL) 800 MG tablet Take 800 mg by mouth 3 (three) times daily.     levETIRAcetam (KEPPRA) 750 MG tablet Take 1 tablet (750 mg total) by mouth 2 (two) times daily. 180 tablet 4   losartan (COZAAR) 25 MG tablet Take 1 tablet (25 mg total) by mouth daily. 90 tablet 3   meloxicam (MOBIC) 7.5 MG tablet Take 1 tablet (7.5 mg total) by mouth daily. 90 tablet 0   nortriptyline (PAMELOR) 25 MG capsule TAKE 2 CAPSULES(50 MG) BY MOUTH AT BEDTIME 60 capsule 5   omeprazole (PRILOSEC) 20 MG capsule Take 20 mg by mouth daily. Pt takes in the morning     No current facility-administered medications for this visit.    SURGICAL HISTORY:  Past Surgical History:  Procedure  Laterality Date   APPLICATION OF CRANIAL NAVIGATION N/A 01/07/2020   Procedure: APPLICATION OF CRANIAL NAVIGATION;  Surgeon: Ashok Pall, MD;  Location: [facility];  Service: Neurosurgery;  Laterality: N/A;   CRANIOTOMY Right 01/07/2020   Procedure: RIGHT CRANIOTOMY FOR TUMOR RESECTION;  Surgeon: Ashok Pall, MD;  Location: [facility];  Service: Neurosurgery;  Laterality: Right;  rm 21   ENDOBRONCHIAL ULTRASOUND N/A 12/21/2019   Procedure: ENDOBRONCHIAL ULTRASOUND;  Surgeon: Laurin Coder, MD;  [facility];  Service: Pulmonary;  Laterality: N/A;   FINE NEEDLE ASPIRATION  12/21/2019   Procedure: FINE NEEDLE ASPIRATION (FNA) LINEAR;  Surgeon: Laurin Coder, MD;  [facility];  Service: Pulmonary;;   IR IMAGING GUIDED PORT INSERTION  03/03/2020   NO PAST SURGERIES     VIDEO BRONCHOSCOPY N/A 12/21/2019   Procedure: VIDEO BRONCHOSCOPY WITHOUT FLUORO;  Surgeon: Laurin Coder, MD;  [facility];  Service: Pulmonary;  Laterality: N/A;    REVIEW OF SYSTEMS:  A comprehensive review of systems was negative except for: Musculoskeletal: positive for arthralgias   PHYSICAL EXAMINATION: General appearance: alert, cooperative, and no distress Head: Normocephalic, without obvious abnormality, atraumatic Neck: no adenopathy, no JVD, supple, symmetrical, trachea midline, and thyroid not enlarged, symmetric, no tenderness/mass/nodules Lymph nodes: Cervical, supraclavicular, and axillary nodes normal. Resp: clear to auscultation bilaterally Back: symmetric, no curvature. ROM normal. No CVA tenderness. Cardio: regular rate and rhythm, S1, S2 normal, no murmur, click, rub or gallop GI: soft, non-tender; bowel sounds normal; no masses,  no organomegaly Extremities: extremities normal, atraumatic, no cyanosis or edema  ECOG PERFORMANCE STATUS: 1 - Symptomatic but completely ambulatory  Blood pressure (!) 153/85, pulse 87, temperature (!) 97.5 F (36.4 C), temperature source Tympanic, resp. rate 18, height $RemoveBe'6\' 1"'omVQvIqcf$  (1.854 m), weight 195 lb 9 oz (88.7 kg), SpO2 100 %.  LABORATORY DATA: Lab Results  Component Value Date   WBC 6.1 07/10/2021   HGB 13.3 07/10/2021   HCT 38.3 (L) 07/10/2021   MCV 86.8 07/10/2021   PLT 307 07/10/2021      Chemistry      Component Value Date/Time   NA 142 07/10/2021 1034   NA 140 11/23/2019 1516   K 3.8 07/10/2021 1034   CL 108 07/10/2021 1034   CO2 28 07/10/2021 1034   BUN 12 07/10/2021 1034   BUN 14 11/23/2019  1516   CREATININE 0.83 07/10/2021 1034      Component Value Date/Time   CALCIUM 9.2 07/10/2021 1034   ALKPHOS 72 07/10/2021 1034   AST 20 07/10/2021 1034   ALT 27 07/10/2021 1034   BILITOT 0.6 07/10/2021 1034       RADIOGRAPHIC STUDIES: MR Brain W Wo Contrast  Result Date: 06/19/2021 CLINICAL DATA:  Brain/CNS neoplasm, assess treatment response EXAM: MRI HEAD WITHOUT AND WITH CONTRAST TECHNIQUE: Multiplanar, multiecho pulse sequences of the brain and surrounding structures were obtained without and with intravenous contrast. CONTRAST:  9.73mL GADAVIST GADOBUTROL 1 MMOL/ML IV SOLN COMPARISON:  03/05/2021 FINDINGS: Brain: Stable small area of gliosis and chronic blood products underlying the craniotomy along the right precentral gyrus. There is no acute infarction or intracranial hemorrhage. There is no intracranial mass, mass effect, or edema. There is no hydrocephalus or extra-axial fluid collection. Additional patchy foci of T2 hyperintensity in the supratentorial white matter probably reflect similar mild chronic microvascular ischemic changes. Ventricles and sulci are normal in size and configuration. No abnormal enhancement. Vascular: Major vessel flow voids at the skull base are preserved.  Skull and upper cervical spine: Normal marrow signal is preserved. Sinuses/Orbits: Paranasal sinuses are aerated. Orbits are unremarkable. Other: Sella is unremarkable.  Mastoid air cells are clear. IMPRESSION: No evidence of recurrent disease. Electronically Signed   By: Macy Mis M.D.   On: 06/19/2021 08:25     ASSESSMENT AND PLAN: This is a very pleasant 66 years old African-American male recently diagnosed with a stage IV (TX, N2, M1c) non-small cell lung cancer, adenocarcinoma presented with solitary brain metastasis in addition to right hilar and mediastinal lymphadenopathy diagnosed in August 2021.  The patient is status post right craniotomy with resection of the solitary brain metastasis with  SRS. His PET scan showed no evidence of metastatic disease outside the chest. The patient was treated with a course of concurrent chemoradiation with weekly carboplatin and paclitaxel status post 5 cycles.  He tolerated his treatment well except for dysphagia and odynophagia. His PD-L1 expression is 70%. The patient started treatment with immunotherapy with Cemiplimab 350 mg IV every 3 weeks status post 20 cycles.  The patient continues to tolerate his treatment with Libtayo (Cempilimab) fairly well with no concerning adverse effects. I recommended for him to proceed with cycle #21 today as planned. I will see the patient back for follow-up visit in 3 weeks for evaluation before starting the next cycle of his treatment. He was advised to call immediately if he has any other concerning symptoms in the interval.  The patient voices understanding of current disease status and treatment options and is in agreement with the current care plan.  All questions were answered. The patient knows to call the clinic with any problems, questions or concerns. We can certainly see the patient much sooner if necessary.   Disclaimer: This note was dictated with voice recognition software. Similar sounding words can inadvertently be transcribed and may not be corrected upon review.

## 2021-07-10 NOTE — Patient Instructions (Signed)
[facility]  Discharge Instructions: Thank you for choosing [facility] to provide your oncology and hematology care.   If you have a lab appointment with the [facility], please go directly to the [facility] and check in at the registration area.   Wear comfortable clothing and clothing appropriate for easy access to any Portacath or PICC line.   We strive to give you quality time with your provider. You may need to reschedule your appointment if you arrive late (15 or more minutes).  Arriving late affects you and other patients whose appointments are after yours.  Also, if you miss three or more appointments without notifying the office, you may be dismissed from the clinic at the providers discretion.      For prescription refill requests, have your pharmacy contact our office and allow 72 hours for refills to be completed.    Today you received the following chemotherapy and/or immunotherapy agents: Libtayo.      To help prevent nausea and vomiting after your treatment, we encourage you to take your nausea medication as directed.  BELOW ARE SYMPTOMS THAT SHOULD BE REPORTED IMMEDIATELY: *FEVER GREATER THAN 100.4 F (38 C) OR HIGHER *CHILLS OR SWEATING *NAUSEA AND VOMITING THAT IS NOT CONTROLLED WITH YOUR NAUSEA MEDICATION *UNUSUAL SHORTNESS OF BREATH *UNUSUAL BRUISING OR BLEEDING *URINARY PROBLEMS (pain or burning when urinating, or frequent urination) *BOWEL PROBLEMS (unusual diarrhea, constipation, pain near the anus) TENDERNESS IN MOUTH AND THROAT WITH OR WITHOUT PRESENCE OF ULCERS (sore throat, sores in mouth, or a toothache) UNUSUAL RASH, SWELLING OR PAIN  UNUSUAL VAGINAL DISCHARGE OR ITCHING   Items with * indicate a potential emergency and should be followed up as soon as possible or go to the Emergency Department if any problems should occur.  Please show the CHEMOTHERAPY ALERT CARD or IMMUNOTHERAPY ALERT CARD at check-in to  the Emergency Department and triage nurse.  Should you have questions after your visit or need to cancel or reschedule your appointment, please contact [facility]  Dept: [phone]  and follow the prompts.  Office hours are 8:00 a.m. to 4:30 p.m. Monday - Friday. Please note that voicemails left after 4:00 p.m. may not be returned until the following business day.  We are closed weekends and major holidays. You have access to a nurse at all times for urgent questions. Please call the main number to the clinic Dept: [phone] and follow the prompts.   For any non-urgent questions, you may also contact your provider using MyChart. We now offer e-Visits for anyone 25 and older to request care online for non-urgent symptoms. For details visit [email or web address].   Also download the MyChart app! Go to the app store, search "MyChart", open the app, select [facility], and log in with your MyChart username and password.  Due to Covid, a mask is required upon entering the hospital/clinic. If you do not have a mask, one will be given to you upon arrival. For doctor visits, patients may have 1 support person aged 17 or older with them. For treatment visits, patients cannot have anyone with them due to current Covid guidelines and our immunocompromised population.

## 2021-07-11 ENCOUNTER — Ambulatory Visit (INDEPENDENT_AMBULATORY_CARE_PROVIDER_SITE_OTHER): Payer: Medicare HMO | Admitting: Internal Medicine

## 2021-07-11 ENCOUNTER — Encounter: Payer: Self-pay | Admitting: Internal Medicine

## 2021-07-11 VITALS — BP 127/79 | HR 93 | Ht 73.0 in | Wt 201.6 lb

## 2021-07-11 DIAGNOSIS — M25561 Pain in right knee: Secondary | ICD-10-CM

## 2021-07-11 DIAGNOSIS — Z0000 Encounter for general adult medical examination without abnormal findings: Secondary | ICD-10-CM

## 2021-07-11 DIAGNOSIS — Z23 Encounter for immunization: Secondary | ICD-10-CM

## 2021-07-11 DIAGNOSIS — C3490 Malignant neoplasm of unspecified part of unspecified bronchus or lung: Secondary | ICD-10-CM

## 2021-07-11 DIAGNOSIS — M25562 Pain in left knee: Secondary | ICD-10-CM

## 2021-07-11 DIAGNOSIS — I10 Essential (primary) hypertension: Secondary | ICD-10-CM

## 2021-07-11 DIAGNOSIS — G8929 Other chronic pain: Secondary | ICD-10-CM

## 2021-07-11 DIAGNOSIS — F17200 Nicotine dependence, unspecified, uncomplicated: Secondary | ICD-10-CM

## 2021-07-11 DIAGNOSIS — N529 Male erectile dysfunction, unspecified: Secondary | ICD-10-CM | POA: Diagnosis not present

## 2021-07-11 DIAGNOSIS — M25569 Pain in unspecified knee: Secondary | ICD-10-CM | POA: Insufficient documentation

## 2021-07-11 HISTORY — DX: Male erectile dysfunction, unspecified: N52.9

## 2021-07-11 MED ORDER — SILDENAFIL CITRATE 50 MG PO TABS: 100.0000 mg | ORAL_TABLET | Freq: Every day | ORAL | 0 refills | Status: DC | PRN

## 2021-07-11 NOTE — Assessment & Plan Note (Signed)
Patient has OA of his bilateral knees. Saw an orthopedist in January and got an injection which he found extremely helpful. He says that voltaren gel and pain medications have not helped him like the injections did. Last injection was in January, discussed with patient that we don't give injections more frequently than every three months but would be able to offer him these in clinic. - next knee injections in April

## 2021-07-11 NOTE — Assessment & Plan Note (Signed)
Prevnar shot today

## 2021-07-11 NOTE — Patient Instructions (Addendum)
Jared Shea  It was a pleasure seeing you in the clinic today.   We talked about your knee pain, your performance, your blood pressure, and gave you the pneumonia shot.  You can come back in about 2 months for knee injections.  Please call our clinic at [phone] if you have any questions or concerns. The best time to call is Monday-Friday from 9am-4pm, but there is someone available 24/7 at the same number. If you need medication refills, please notify your pharmacy one week in advance and they will send [place] a request.   Thank you for letting us take part in your care. We look forward to seeing you next time!

## 2021-07-11 NOTE — Assessment & Plan Note (Signed)
Patient continues to follow with his oncologist. Disease is currently stable.

## 2021-07-11 NOTE — Assessment & Plan Note (Signed)
Patient expresses a difficulty achieving erections. Still endorses some spontaneous erections but they are harder to achieve now. He has tried sildenafil that he found helpful and would like a prescription for it. - sildenafil prescription

## 2021-07-11 NOTE — Progress Notes (Signed)
Internal Medicine Clinic Attending ° °Case discussed with Dr. DeMaio  At the time of the visit.  We reviewed the resident’s history and exam and pertinent patient test results.  I agree with the assessment, diagnosis, and plan of care documented in the resident’s note. ° ° °

## 2021-07-11 NOTE — Progress Notes (Signed)
° °  CC: knee pain  HPI:  [name] is a 66 y.o. PMH noted below, who presents to the [facility] with complaints of knee pain. To see the management of his acute and chronic conditions, please refer to the A&P note under the encounters tab.   Past Medical History:  Diagnosis Date   Brain mass    Bursitis of right hip    Chest pain 08/12/2018   Chronic cough    Dependence on nicotine from cigarettes    Diabetes mellitus without complication ([place])    Essential hypertension 07/28/2018   nscl ca dx'd 12/2019   Seizures ([place])    Viral illness 03/23/2019   Review of Systems:  positive for knee pain, headaches, difficulty achieving erections  Physical Exam: Gen: elderly man in NAD HEENT: normocephalic atraumatic, MMM CV: RRR, no m/r/g   Resp: CTAB, normal WOB  GI: soft, nontender MSK: moves all extremities without difficulty Skin:warm and dry Neuro:alert answering questions appropriately Psych: normal affect   Assessment & Plan:   See Encounters Tab for problem based charting.  Patient discussed with Dr.  Cain Shea

## 2021-07-11 NOTE — Assessment & Plan Note (Signed)
Blood pressure at goal of <130 on recheck at 127/79. - continue losartan 25

## 2021-07-12 ENCOUNTER — Telehealth: Payer: Self-pay

## 2021-07-12 NOTE — Telephone Encounter (Signed)
sildenafil (VIAGRA) 50 MG  ?[id number] - [place], [address] ? ?Patient called he stated [facility] told him the rx needs to be authorized and the reason of medication in order for him to get rx ?

## 2021-07-28 NOTE — Progress Notes (Signed)
[facility] ?OFFICE PROGRESS NOTE ? ?[name], MD ?[address] ?[place] [address] ? ?DIAGNOSIS: Stage IV (TX, N2, M1 C) non-small cell lung cancer, adenocarcinoma diagnosed in August 2021 and presented with solitary brain metastasis in addition to mediastinal lymphadenopathy. ?  ?PDL1 Expression 70% ?  ?Molecular Biomarkers:  ?Tumor Mutational Burden - 52 Muts/Mb ?Microsatellite status - MS-Stable ?Genomic Findings ?For a complete list of the genes assayed, please refer to the Appendix. ?NF1 E1694* ?MTAP loss exons 2-8 ?RICTOR amplification ?ATRX A419V ?BRAF K483E ?CDKN2A/B CDKN2A loss, CDKN2B loss ?DNMT3A E205* ?FGF10 amplification ?NTRK1 amplification - equivocal? ?7 Disease relevant genes with no reportable alterations: ALK, EGFR, ?ERBB2, KRAS, MET, RET, ROS1  ? ?PRIOR THERAPY:  ?1) Status post right craniotomy with tumor resection followed by SRS to solitary brain metastasis under the care of Dr. Lisbeth Renshaw and Dr. Sherral Hammers. ?2) Concurrent chemoradiation with weekly carboplatin for AUC of 2 and paclitaxel 45 mg/M2.  First dose 02/01/2020. Status post 5 cycles.  Last dose was given February 29, 2020. ? ?CURRENT THERAPY: First-line treatment with immunotherapy with Libtayo (Cempilimab) 350 mg IV every 3 weeks.  First dose April 25, 2020.  Status post 21 cycles. ? ?INTERVAL HISTORY: ?Jared Shea. 66 y.o. male returns to the clinic today for a follow-up visit.  The patient is feeling fairly well today without any new concerning complaints.  The patient has been having concerns related to arthralgias since starting immunotherapy.  He was finally agreeable to seeing an orthopedic provider and he was found to have degenerative joint disease in his knees.  He received a joint injection with improvement in his symptoms. However, this only provided temporary relief. The patient is presently taking Norco or ibuprofen for his knee pain.  He also uses Voltaren cream. He is scheduled to see his PCP  on 08/24/21 for consideration of repeat joint injection. He is requesting a refill of norco today.  ? ?He denies any fever, chills, or night sweats. His weight and appetite is stable. He reports his appetite "comes and goes". He denies associated wheezing, chest pain, lightheadedness, syncope, or palpitations. He denies cough. He has some occasional shortness of breath for which he will use his albuterol. Denies any nausea, vomiting, diarrhea, or constipation.  Denies any rashes or skin changes. He is followed closely by Dr. Mickeal Skinner for his history of metastatic disease to the brain. His MRI from last month was stable. His next brain MRI will be in June. He is here today for evaluation before starting cycle #22 ? ?MEDICAL HISTORY: ?Past Medical History:  ?Diagnosis Date  ? Brain mass   ? Bursitis of right hip   ? Chest pain 08/12/2018  ? Chronic cough   ? Dependence on nicotine from cigarettes   ? Diabetes mellitus without complication ([place])   ? Essential hypertension 07/28/2018  ? nscl ca dx'd 12/2019  ? Seizures ([place])   ? Viral illness 03/23/2019  ? ? ?ALLERGIES:  is allergic to penicillins. ? ?MEDICATIONS:  ?Current Outpatient Medications  ?Medication Sig Dispense Refill  ? albuterol (VENTOLIN HFA) 108 (90 Base) MCG/ACT inhaler Inhale 2 puffs into the lungs every 6 (six) hours as needed for wheezing or shortness of breath. 8 g 2  ? HYDROcodone-acetaminophen (NORCO) 5-325 MG tablet Take 1 tablet by mouth every 6 (six) hours as needed for moderate pain. 30 tablet 0  ? ibuprofen (ADVIL) 800 MG tablet Take 800 mg by mouth 3 (three) times daily.    ?  levETIRAcetam (KEPPRA) 750 MG tablet Take 1 tablet (750 mg total) by mouth 2 (two) times daily. 180 tablet 4  ? losartan (COZAAR) 25 MG tablet Take 1 tablet (25 mg total) by mouth daily. 90 tablet 3  ? meloxicam (MOBIC) 7.5 MG tablet Take 1 tablet (7.5 mg total) by mouth daily. 90 tablet 0  ? nortriptyline (PAMELOR) 25 MG capsule TAKE 2 CAPSULES(50 MG) BY MOUTH AT BEDTIME 60  capsule 5  ? omeprazole (PRILOSEC) 20 MG capsule Take 20 mg by mouth daily. Pt takes in the morning    ? sildenafil (VIAGRA) 50 MG tablet Take 2 tablets (100 mg total) by mouth daily as needed for erectile dysfunction. 30 tablet 0  ? ?No current facility-administered medications for this visit.  ? ? ?SURGICAL HISTORY:  ?Past Surgical History:  ?Procedure Laterality Date  ? APPLICATION OF CRANIAL NAVIGATION N/A 01/07/2020  ? Procedure: APPLICATION OF CRANIAL NAVIGATION;  Surgeon: Ashok Pall, MD;  Location: [facility];  Service: Neurosurgery;  Laterality: N/A;  ? CRANIOTOMY Right 01/07/2020  ? Procedure: RIGHT CRANIOTOMY FOR TUMOR RESECTION;  Surgeon: Ashok Pall, MD;  Location: [facility];  Service: Neurosurgery;  Laterality: Right;  rm 21  ? ENDOBRONCHIAL ULTRASOUND N/A 12/21/2019  ? Procedure: ENDOBRONCHIAL ULTRASOUND;  Surgeon: Laurin Coder, MD;  [facility];  Service: Pulmonary;  Laterality: N/A;  ? FINE NEEDLE ASPIRATION  12/21/2019  ? Procedure: FINE NEEDLE ASPIRATION (FNA) LINEAR;  Surgeon: Laurin Coder, MD;  [facility];  Service: Pulmonary;;  ? IR IMAGING GUIDED PORT INSERTION  03/03/2020  ? NO PAST SURGERIES    ? VIDEO BRONCHOSCOPY N/A 12/21/2019  ? Procedure: VIDEO BRONCHOSCOPY WITHOUT FLUORO;  Surgeon: Laurin Coder, MD;  [facility];  Service: Pulmonary;  Laterality: N/A;  ? ? ?REVIEW OF SYSTEMS:   ?Review of Systems  ?Constitutional: Positive for fatigue. Negative for appetite change, chills, and fever.  ?HENT: Negative for mouth sores, nosebleeds, sore throat and trouble swallowing.   ?Eyes: Positive for visual changes with bending over. ?Respiratory: Positive for intermittent spells of shortness of breath. Negative for hemoptysis and wheezing.   ?Cardiovascular: Negative for chest pain and leg swelling.  ?Gastrointestinal: Negative for abdominal pain, constipation, diarrhea, nausea and vomiting.  ?Genitourinary: Negative for bladder incontinence, difficulty  urinating, dysuria, frequency and hematuria.   ?Musculoskeletal: Positive for polyarthralgia, particularly in the knees.  ?Skin: Negative for itching and rash.  ?Neurological: Negative for dizziness, headaches, extremity weakness, gait problem, light-headedness and seizures.  ?Hematological: Negative for adenopathy. Does not bruise/bleed easily.  ?Psychiatric/Behavioral: Positive for memory changes since undergoing treatment for metastatic disease to the brain. Negative for confusion, depression and sleep disturbance. The patient is not nervous/anxious. ? ? ?PHYSICAL EXAMINATION:  ?Blood pressure (!) 171/85, pulse 76, temperature (!) 97.5 ?F (36.4 ?C), temperature source Oral, resp. rate 17, weight 199 lb 14.4 oz (90.7 kg), SpO2 100 %. ? ?ECOG PERFORMANCE STATUS: 1 ? ?Physical Exam  ?Constitutional: Oriented to person, place, and time and well-developed, well-nourished, and in no distress.  ?HENT:  ?Head: Normocephalic and atraumatic.  ?Mouth/Throat: Oropharynx is clear and moist. No oropharyngeal exudate.  ?Eyes: Conjunctivae are normal. Right eye exhibits no discharge. Left eye exhibits no discharge. No scleral icterus.  ?Neck: Normal range of motion. Neck supple.  ?Cardiovascular: Normal rate, regular rhythm, normal heart sounds and intact distal pulses.   ?Pulmonary/Chest: Effort normal and breath sounds normal. No respiratory distress. No wheezes. No rales.  ?Abdominal: Soft. Bowel sounds are normal. Exhibits no  distension and no mass. There is no tenderness.  ?Musculoskeletal: Normal range of motion. Exhibits no edema.  ?Lymphadenopathy:  ?  No cervical adenopathy.  ?Neurological: Alert and oriented to person, place, and time. Exhibits normal muscle tone. Gait normal. Coordination normal.  ?Skin: Skin is warm and dry. No rash noted. Not diaphoretic. No erythema. No pallor.  ?Psychiatric: Mood, memory and judgment normal.  ?Vitals reviewed. ? ?LABORATORY DATA: ?Lab Results  ?Component Value Date  ? WBC 7.0  07/31/2021  ? HGB 12.4 (L) 07/31/2021  ? HCT 35.7 (L) 07/31/2021  ? MCV 86.0 07/31/2021  ? PLT 351 07/31/2021  ? ? ?  Chemistry   ?   ?Component Value Date/Time  ? NA 140 07/31/2021 1051  ? NA 140 11/23/2019 1516

## 2021-07-31 ENCOUNTER — Other Ambulatory Visit: Payer: Self-pay

## 2021-07-31 ENCOUNTER — Inpatient Hospital Stay: Payer: Medicare HMO

## 2021-07-31 ENCOUNTER — Inpatient Hospital Stay: Payer: Medicare HMO | Attending: Internal Medicine

## 2021-07-31 ENCOUNTER — Inpatient Hospital Stay (HOSPITAL_BASED_OUTPATIENT_CLINIC_OR_DEPARTMENT_OTHER): Payer: Medicare HMO | Admitting: Physician Assistant

## 2021-07-31 VITALS — BP 171/85 | HR 76 | Temp 97.5°F | Resp 17 | Wt 199.9 lb

## 2021-07-31 VITALS — BP 148/78

## 2021-07-31 DIAGNOSIS — C3412 Malignant neoplasm of upper lobe, left bronchus or lung: Secondary | ICD-10-CM | POA: Diagnosis not present

## 2021-07-31 DIAGNOSIS — Z79899 Other long term (current) drug therapy: Secondary | ICD-10-CM | POA: Insufficient documentation

## 2021-07-31 DIAGNOSIS — C7931 Secondary malignant neoplasm of brain: Secondary | ICD-10-CM | POA: Insufficient documentation

## 2021-07-31 DIAGNOSIS — C799 Secondary malignant neoplasm of unspecified site: Secondary | ICD-10-CM

## 2021-07-31 DIAGNOSIS — C3490 Malignant neoplasm of unspecified part of unspecified bronchus or lung: Secondary | ICD-10-CM

## 2021-07-31 DIAGNOSIS — Z5112 Encounter for antineoplastic immunotherapy: Secondary | ICD-10-CM | POA: Insufficient documentation

## 2021-07-31 DIAGNOSIS — Z95828 Presence of other vascular implants and grafts: Secondary | ICD-10-CM

## 2021-07-31 LAB — CMP (CANCER CENTER ONLY)
ALT: 49 U/L — ABNORMAL HIGH (ref 0–44)
AST: 36 U/L (ref 15–41)
Albumin: 4.5 g/dL (ref 3.5–5.0)
Alkaline Phosphatase: 76 U/L (ref 38–126)
Anion gap: 6 (ref 5–15)
BUN: 16 mg/dL (ref 8–23)
CO2: 27 mmol/L (ref 22–32)
Calcium: 9.4 mg/dL (ref 8.9–10.3)
Chloride: 107 mmol/L (ref 98–111)
Creatinine: 0.73 mg/dL (ref 0.61–1.24)
GFR, Estimated: >60 mL/min (ref >60)
Glucose, Bld: 130 mg/dL — ABNORMAL HIGH (ref 70–99)
Potassium: 3.9 mmol/L (ref 3.5–5.1)
Sodium: 140 mmol/L (ref 135–145)
Total Bilirubin: 0.7 mg/dL (ref 0.3–1.2)
Total Protein: 8 g/dL (ref 6.5–8.1)

## 2021-07-31 LAB — CBC WITH DIFFERENTIAL (CANCER CENTER ONLY)
Abs Immature Granulocytes: 0.02 10*3/uL (ref 0.00–0.07)
Basophils Absolute: 0 10*3/uL (ref 0.0–0.1)
Basophils Relative: 1 %
Eosinophils Absolute: 0.1 10*3/uL (ref 0.0–0.5)
Eosinophils Relative: 2 %
HCT: 35.7 % — ABNORMAL LOW (ref 39.0–52.0)
Hemoglobin: 12.4 g/dL — ABNORMAL LOW (ref 13.0–17.0)
Immature Granulocytes: 0 %
Lymphocytes Relative: 22 %
Lymphs Abs: 1.6 10*3/uL (ref 0.7–4.0)
MCH: 29.9 pg (ref 26.0–34.0)
MCHC: 34.7 g/dL (ref 30.0–36.0)
MCV: 86 fL (ref 80.0–100.0)
Monocytes Absolute: 0.6 10*3/uL (ref 0.1–1.0)
Monocytes Relative: 8 %
Neutro Abs: 4.7 10*3/uL (ref 1.7–7.7)
Neutrophils Relative %: 67 %
Platelet Count: 351 10*3/uL (ref 150–400)
RBC: 4.15 MIL/uL — ABNORMAL LOW (ref 4.22–5.81)
RDW: 14.3 % (ref 11.5–15.5)
WBC Count: 7 10*3/uL (ref 4.0–10.5)
nRBC: 0 % (ref 0.0–0.2)

## 2021-07-31 LAB — TSH: TSH: 1.334 u[IU]/mL (ref 0.320–4.118)

## 2021-07-31 MED ORDER — HEPARIN SOD (PORK) LOCK FLUSH 100 UNIT/ML IV SOLN
500.0000 [IU] | Freq: Once | INTRAVENOUS | Status: AC | PRN
  Administered 2021-07-31: 500 [IU]

## 2021-07-31 MED ORDER — SODIUM CHLORIDE 0.9 % IV SOLN
350.0000 mg | Freq: Once | INTRAVENOUS | Status: AC
  Administered 2021-07-31: 350 mg via INTRAVENOUS
  Filled 2021-07-31: qty 7

## 2021-07-31 MED ORDER — SODIUM CHLORIDE 0.9 % IV SOLN: Freq: Once | INTRAVENOUS | Status: AC

## 2021-07-31 MED ORDER — SODIUM CHLORIDE 0.9% FLUSH
10.0000 mL | Freq: Once | INTRAVENOUS | Status: AC
  Administered 2021-07-31: 10 mL

## 2021-07-31 MED ORDER — SODIUM CHLORIDE 0.9% FLUSH
10.0000 mL | INTRAVENOUS | Status: DC | PRN
  Administered 2021-07-31: 10 mL

## 2021-07-31 MED ORDER — HYDROCODONE-ACETAMINOPHEN 5-325 MG PO TABS: 1.0000 | ORAL_TABLET | Freq: Four times a day (QID) | ORAL | 0 refills | Status: DC | PRN

## 2021-07-31 NOTE — Patient Instructions (Addendum)
[facility]  Discharge Instructions: ?Thank you for choosing [facility] to provide your oncology and hematology care.  ? ?If you have a lab appointment with the [facility], please go directly to the [facility] and check in at the registration area. ?  ?Wear comfortable clothing and clothing appropriate for easy access to any Portacath or PICC line.  ? ?We strive to give you quality time with your provider. You may need to reschedule your appointment if you arrive late (15 or more minutes).  Arriving late affects you and other patients whose appointments are after yours.  Also, if you miss three or more appointments without notifying the office, you may be dismissed from the clinic at the provider?s discretion.    ?  ?For prescription refill requests, have your pharmacy contact our office and allow 72 hours for refills to be completed.   ? ?Today you received the following chemotherapy and/or immunotherapy agent: Cemiplimab (Libtayo). ?  ?To help prevent nausea and vomiting after your treatment, we encourage you to take your nausea medication as directed. ? ?BELOW ARE SYMPTOMS THAT SHOULD BE REPORTED IMMEDIATELY: ?*FEVER GREATER THAN 100.4 F (38 ?C) OR HIGHER ?*CHILLS OR SWEATING ?*NAUSEA AND VOMITING THAT IS NOT CONTROLLED WITH YOUR NAUSEA MEDICATION ?*UNUSUAL SHORTNESS OF BREATH ?*UNUSUAL BRUISING OR BLEEDING ?*URINARY PROBLEMS (pain or burning when urinating, or frequent urination) ?*BOWEL PROBLEMS (unusual diarrhea, constipation, pain near the anus) ?TENDERNESS IN MOUTH AND THROAT WITH OR WITHOUT PRESENCE OF ULCERS (sore throat, sores in mouth, or a toothache) ?UNUSUAL RASH, SWELLING OR PAIN  ?UNUSUAL VAGINAL DISCHARGE OR ITCHING  ? ?Items with * indicate a potential emergency and should be followed up as soon as possible or go to the Emergency Department if any problems should occur. ? ?Please show the CHEMOTHERAPY ALERT CARD or IMMUNOTHERAPY ALERT CARD at  check-in to the Emergency Department and triage nurse. ? ?Should you have questions after your visit or need to cancel or reschedule your appointment, please contact [facility]  Dept: [phone]  and follow the prompts.  Office hours are 8:00 a.m. to 4:30 p.m. Monday - Friday. Please note that voicemails left after 4:00 p.m. may not be returned until the following business day.  We are closed weekends and major holidays. You have access to a nurse at all times for urgent questions. Please call the main number to the clinic Dept: [phone] and follow the prompts. ? ? ?For any non-urgent questions, you may also contact your provider using MyChart. We now offer e-Visits for anyone 68 and older to request care online for non-urgent symptoms. For details visit [email or web address]. ?  ?Also download the MyChart app! Go to the app store, search "MyChart", open the app, select [facility], and log in with your MyChart username and password. ? ?Due to Covid, a mask is required upon entering the hospital/clinic. If you do not have a mask, one will be given to you upon arrival. For doctor visits, patients may have 1 support person aged 68 or older with them. For treatment visits, patients cannot have anyone with them due to current Covid guidelines and our immunocompromised population.  ? ?Get a blood pressure cuff and monitor at home the same time daily until you see your Primary Care Provider. Bring PCP log of blood pressure readings.  ?

## 2021-07-31 NOTE — Progress Notes (Signed)
Per Cassie PA, ok for treatment today with elevated blood pressure. Pt. denies complaints of chest pain, dizziness, and no shortness of breath noted. Pt. instructed to monitor is blood pressure at the same time daily and log then bring readings to his next PCP appt. Pt. states he understands. ?

## 2021-08-01 ENCOUNTER — Other Ambulatory Visit: Payer: Self-pay | Admitting: *Deleted

## 2021-08-01 MED ORDER — SILDENAFIL CITRATE 50 MG PO TABS: 100.0000 mg | ORAL_TABLET | Freq: Every day | ORAL | 3 refills | Status: DC | PRN

## 2021-08-01 NOTE — Telephone Encounter (Signed)
Call from nurse at [facility] - stated pt requesting Sildenafil rx sent to [facility] on [address], she found a coupon and the cost will be $12.00 instead $90.00. ?Thanks ?

## 2021-08-15 ENCOUNTER — Encounter: Payer: Self-pay | Admitting: Orthopaedic Surgery

## 2021-08-15 ENCOUNTER — Ambulatory Visit (INDEPENDENT_AMBULATORY_CARE_PROVIDER_SITE_OTHER): Payer: Medicare HMO | Admitting: Orthopaedic Surgery

## 2021-08-15 ENCOUNTER — Telehealth: Payer: Self-pay

## 2021-08-15 DIAGNOSIS — M170 Bilateral primary osteoarthritis of knee: Secondary | ICD-10-CM | POA: Diagnosis not present

## 2021-08-15 DIAGNOSIS — M1711 Unilateral primary osteoarthritis, right knee: Secondary | ICD-10-CM | POA: Insufficient documentation

## 2021-08-15 DIAGNOSIS — M1712 Unilateral primary osteoarthritis, left knee: Secondary | ICD-10-CM | POA: Insufficient documentation

## 2021-08-15 MED ORDER — METHYLPREDNISOLONE ACETATE 40 MG/ML IJ SUSP
40.0000 mg | INTRAMUSCULAR | Status: AC | PRN
  Administered 2021-08-15: 40 mg via INTRA_ARTICULAR

## 2021-08-15 MED ORDER — LIDOCAINE HCL 1 % IJ SOLN
2.0000 mL | INTRAMUSCULAR | Status: AC | PRN
  Administered 2021-08-15: 2 mL

## 2021-08-15 MED ORDER — BUPIVACAINE HCL 0.5 % IJ SOLN
2.0000 mL | INTRAMUSCULAR | Status: AC | PRN
  Administered 2021-08-15: 2 mL via INTRA_ARTICULAR

## 2021-08-15 NOTE — Telephone Encounter (Signed)
Noted  

## 2021-08-15 NOTE — Telephone Encounter (Signed)
Please precert for bilateral visco. Dr.Xu's patient. ?Thanks! ? ?

## 2021-08-15 NOTE — Progress Notes (Signed)
? ?Office Visit Note ?  ?Patient: Jared Shea.           ?Date of Birth: Jul 02, 1955           ?MRN: [id number] ?Visit Date: 08/15/2021 ?             ?Requested by: [name], MD ?[address] ?[place],  [place] [address] ?PCP: [name], MD ? ? ?Assessment & Plan: ?Visit Diagnoses:  ?1. Primary osteoarthritis of right knee   ?2. Primary osteoarthritis of left knee   ? ? ?Plan: [name] returns today for bilateral knee pain.  We performed cortisone injections in both knees about 3 months ago.  Requesting more injections today.  He would like to get approval for Visco as well.  Both knees were injected with cortisone today and he tolerated them well.  We will see him back for the Visco injections. ? ?This patient is diagnosed with osteoarthritis of the knee(s).   ? ?Radiographs show evidence of joint space narrowing, osteophytes, subchondral sclerosis and/or subchondral cysts.  This patient has knee pain which interferes with functional and activities of daily living.   ? ?This patient has experienced inadequate response, adverse effects and/or intolerance with conservative treatments such as acetaminophen, NSAIDS, topical creams, physical therapy or regular exercise, knee bracing and/or weight loss.  ? ?This patient has experienced inadequate response or has a contraindication to intra articular steroid injections for at least 3 months.  ? ?This patient is not scheduled to have a total knee replacement within 6 months of starting treatment with viscosupplementation. ? ?Follow-Up Instructions: No follow-ups on file.  ? ?Orders:  ?No orders of the defined types were placed in this encounter. ? ?No orders of the defined types were placed in this encounter. ? ? ? ? Procedures: ?Large Joint Inj: bilateral knee on 08/15/2021 11:56 AM ?Indications: pain ?Details: 22 G needle ? ?Arthrogram: No ? ?Medications (Right): 2 mL lidocaine 1 %; 2 mL bupivacaine 0.5 %; 40 mg methylPREDNISolone acetate 40 MG/ML ?Medications  (Left): 2 mL lidocaine 1 %; 2 mL bupivacaine 0.5 %; 40 mg methylPREDNISolone acetate 40 MG/ML ?Outcome: tolerated well, no immediate complications ?Patient was prepped and draped in the usual sterile fashion.  ? ? ? ? ?Clinical Data: ?No additional findings. ? ? ?Subjective: ?Chief Complaint  ?Patient presents with  ? Right Knee - Pain  ? Left Knee - Pain  ? ? ?HPI ? ?Review of Systems ? ? ?Objective: ?Vital Signs: There were no vitals taken for this visit. ? ?Physical Exam ? ?Ortho Exam ? ?Specialty Comments:  ?No specialty comments available. ? ?Imaging: ?No results found. ? ? ?PMFS History: ?Patient Active Problem List  ? Diagnosis Date Noted  ? Primary osteoarthritis of right knee 08/15/2021  ? Primary osteoarthritis of left knee 08/15/2021  ? Erectile dysfunction 07/11/2021  ? Knee pain 07/11/2021  ? Dysuria 04/03/2021  ? Vision changes 03/14/2021  ? Memory loss 03/14/2021  ? Port-A-Cath in place 07/05/2020  ? Frequent headaches 06/27/2020  ? Polyarthralgia 06/15/2020  ? Headache 05/09/2020  ? Encounter for antineoplastic immunotherapy 04/18/2020  ? Goals of care, counseling/discussion 04/18/2020  ? Adenocarcinoma, metastatic ([facility]) 01/07/2020  ? Adjustment disorder with depressed mood 01/06/2020  ? Thyroid nodule 01/04/2020  ? Non-small cell carcinoma of lung, stage 4 w/ mets to brain ([facility]) 12/31/2019  ? Partial symptomatic epilepsy with complex partial seizures, not intractable, without status epilepticus ([facility]) 12/23/2019  ? Brain metastases 12/23/2019  ? Focal seizures ([facility]) 12/09/2019  ?  Healthcare maintenance 12/24/2018  ? Essential hypertension 07/28/2018  ? ?Past Medical History:  ?Diagnosis Date  ? Brain mass   ? Bursitis of right hip   ? Chest pain 08/12/2018  ? Chronic cough   ? Dependence on nicotine from cigarettes   ? Diabetes mellitus without complication ([facility])   ? Essential hypertension 07/28/2018  ? nscl ca dx'd 12/2019  ? Seizures ([facility])   ? Viral illness 03/23/2019  ?  ?Family History  ?Problem  Relation Age of Onset  ? Heart disease Mother   ?     CABG  ? Kidney disease Sister   ? Diabetes Brother   ? Kidney disease Brother   ?     KIDNEY TRANSPLANT  ? Hypertension Sister   ? Healthy Daughter   ? Healthy Daughter   ? Healthy Son   ?  ?Past Surgical History:  ?Procedure Laterality Date  ? APPLICATION OF CRANIAL NAVIGATION N/A 01/07/2020  ? Procedure: APPLICATION OF CRANIAL NAVIGATION;  Surgeon: Ashok Pall, MD;  Location: [facility];  Service: Neurosurgery;  Laterality: N/A;  ? CRANIOTOMY Right 01/07/2020  ? Procedure: RIGHT CRANIOTOMY FOR TUMOR RESECTION;  Surgeon: Ashok Pall, MD;  Location: [facility];  Service: Neurosurgery;  Laterality: Right;  rm 21  ? ENDOBRONCHIAL ULTRASOUND N/A 12/21/2019  ? Procedure: ENDOBRONCHIAL ULTRASOUND;  Surgeon: Laurin Coder, MD;  [facility];  Service: Pulmonary;  Laterality: N/A;  ? FINE NEEDLE ASPIRATION  12/21/2019  ? Procedure: FINE NEEDLE ASPIRATION (FNA) LINEAR;  Surgeon: Laurin Coder, MD;  Location: [facility];  Service: Pulmonary;;  ? IR IMAGING GUIDED PORT INSERTION  03/03/2020  ? NO PAST SURGERIES    ? VIDEO BRONCHOSCOPY N/A 12/21/2019  ? Procedure: VIDEO BRONCHOSCOPY WITHOUT FLUORO;  Surgeon: Laurin Coder, MD;  [facility];  Service: Pulmonary;  Laterality: N/A;  ? ?Social History  ? ?Occupational History  ? Not on file  ?Tobacco Use  ? Smoking status: Former  ?  Packs/day: 1.00  ?  Years: 48.00  ?  Pack years: 48.00  ?  Types: Cigarettes  ?  Start date: 08/07/1970  ?  Quit date: 12/15/2019  ?  Years since quitting: 1.6  ? Smokeless tobacco: Never  ?Vaping Use  ? Vaping Use: Never used  ?Substance and Sexual Activity  ? Alcohol use: Not Currently  ? Drug use: Never  ? Sexual activity: Not on file  ?  Comment: YES  ? ? ? ? ? ? ?

## 2021-08-21 ENCOUNTER — Inpatient Hospital Stay: Payer: Medicare HMO | Attending: Internal Medicine

## 2021-08-21 ENCOUNTER — Inpatient Hospital Stay: Payer: Medicare HMO

## 2021-08-21 ENCOUNTER — Other Ambulatory Visit: Payer: Self-pay

## 2021-08-21 ENCOUNTER — Inpatient Hospital Stay (HOSPITAL_BASED_OUTPATIENT_CLINIC_OR_DEPARTMENT_OTHER): Payer: Medicare HMO | Admitting: Internal Medicine

## 2021-08-21 VITALS — BP 136/79 | HR 86 | Temp 97.1°F | Resp 18 | Ht 73.0 in | Wt 198.2 lb

## 2021-08-21 DIAGNOSIS — C799 Secondary malignant neoplasm of unspecified site: Secondary | ICD-10-CM

## 2021-08-21 DIAGNOSIS — R5383 Other fatigue: Secondary | ICD-10-CM | POA: Insufficient documentation

## 2021-08-21 DIAGNOSIS — Z95828 Presence of other vascular implants and grafts: Secondary | ICD-10-CM

## 2021-08-21 DIAGNOSIS — Z79899 Other long term (current) drug therapy: Secondary | ICD-10-CM | POA: Diagnosis not present

## 2021-08-21 DIAGNOSIS — Z9221 Personal history of antineoplastic chemotherapy: Secondary | ICD-10-CM | POA: Insufficient documentation

## 2021-08-21 DIAGNOSIS — Z87891 Personal history of nicotine dependence: Secondary | ICD-10-CM | POA: Insufficient documentation

## 2021-08-21 DIAGNOSIS — Z5112 Encounter for antineoplastic immunotherapy: Secondary | ICD-10-CM | POA: Diagnosis present

## 2021-08-21 DIAGNOSIS — C7931 Secondary malignant neoplasm of brain: Secondary | ICD-10-CM | POA: Diagnosis not present

## 2021-08-21 DIAGNOSIS — Z923 Personal history of irradiation: Secondary | ICD-10-CM | POA: Diagnosis not present

## 2021-08-21 DIAGNOSIS — I10 Essential (primary) hypertension: Secondary | ICD-10-CM | POA: Diagnosis not present

## 2021-08-21 DIAGNOSIS — C3491 Malignant neoplasm of unspecified part of right bronchus or lung: Secondary | ICD-10-CM | POA: Diagnosis not present

## 2021-08-21 DIAGNOSIS — E119 Type 2 diabetes mellitus without complications: Secondary | ICD-10-CM | POA: Insufficient documentation

## 2021-08-21 DIAGNOSIS — C3490 Malignant neoplasm of unspecified part of unspecified bronchus or lung: Secondary | ICD-10-CM | POA: Insufficient documentation

## 2021-08-21 LAB — CBC WITH DIFFERENTIAL (CANCER CENTER ONLY)
Abs Immature Granulocytes: 0.07 10*3/uL (ref 0.00–0.07)
Basophils Absolute: 0 10*3/uL (ref 0.0–0.1)
Basophils Relative: 1 %
Eosinophils Absolute: 0.1 10*3/uL (ref 0.0–0.5)
Eosinophils Relative: 1 %
HCT: 36.5 % — ABNORMAL LOW (ref 39.0–52.0)
Hemoglobin: 12.4 g/dL — ABNORMAL LOW (ref 13.0–17.0)
Immature Granulocytes: 1 %
Lymphocytes Relative: 15 %
Lymphs Abs: 1.3 10*3/uL (ref 0.7–4.0)
MCH: 29.4 pg (ref 26.0–34.0)
MCHC: 34 g/dL (ref 30.0–36.0)
MCV: 86.5 fL (ref 80.0–100.0)
Monocytes Absolute: 0.7 10*3/uL (ref 0.1–1.0)
Monocytes Relative: 8 %
Neutro Abs: 6.3 10*3/uL (ref 1.7–7.7)
Neutrophils Relative %: 74 %
Platelet Count: 367 10*3/uL (ref 150–400)
RBC: 4.22 MIL/uL (ref 4.22–5.81)
RDW: 14.4 % (ref 11.5–15.5)
WBC Count: 8.4 10*3/uL (ref 4.0–10.5)
nRBC: 0 % (ref 0.0–0.2)

## 2021-08-21 LAB — CMP (CANCER CENTER ONLY)
ALT: 36 U/L (ref 0–44)
AST: 23 U/L (ref 15–41)
Albumin: 4.1 g/dL (ref 3.5–5.0)
Alkaline Phosphatase: 86 U/L (ref 38–126)
Anion gap: 7 (ref 5–15)
BUN: 19 mg/dL (ref 8–23)
CO2: 26 mmol/L (ref 22–32)
Calcium: 9 mg/dL (ref 8.9–10.3)
Chloride: 106 mmol/L (ref 98–111)
Creatinine: 0.73 mg/dL (ref 0.61–1.24)
GFR, Estimated: >60 mL/min (ref >60)
Glucose, Bld: 139 mg/dL — ABNORMAL HIGH (ref 70–99)
Potassium: 3.6 mmol/L (ref 3.5–5.1)
Sodium: 139 mmol/L (ref 135–145)
Total Bilirubin: 0.7 mg/dL (ref 0.3–1.2)
Total Protein: 7.7 g/dL (ref 6.5–8.1)

## 2021-08-21 LAB — TSH: TSH: 1.244 u[IU]/mL (ref 0.320–4.118)

## 2021-08-21 MED ORDER — SODIUM CHLORIDE 0.9% FLUSH
10.0000 mL | INTRAVENOUS | Status: DC | PRN
  Administered 2021-08-21: 10 mL

## 2021-08-21 MED ORDER — SODIUM CHLORIDE 0.9 % IV SOLN
350.0000 mg | Freq: Once | INTRAVENOUS | Status: AC
  Administered 2021-08-21: 350 mg via INTRAVENOUS
  Filled 2021-08-21: qty 7

## 2021-08-21 MED ORDER — SODIUM CHLORIDE 0.9% FLUSH
10.0000 mL | Freq: Once | INTRAVENOUS | Status: AC
  Administered 2021-08-21: 10 mL

## 2021-08-21 MED ORDER — SODIUM CHLORIDE 0.9 % IV SOLN: Freq: Once | INTRAVENOUS | Status: AC

## 2021-08-21 MED ORDER — HEPARIN SOD (PORK) LOCK FLUSH 100 UNIT/ML IV SOLN
500.0000 [IU] | Freq: Once | INTRAVENOUS | Status: AC | PRN
  Administered 2021-08-21: 500 [IU]

## 2021-08-21 NOTE — Patient Instructions (Signed)
[facility]  Discharge Instructions: ?Thank you for choosing [facility] to provide your oncology and hematology care.  ? ?If you have a lab appointment with the [facility], please go directly to the [facility] and check in at the registration area. ?  ?Wear comfortable clothing and clothing appropriate for easy access to any Portacath or PICC line.  ? ?We strive to give you quality time with your provider. You may need to reschedule your appointment if you arrive late (15 or more minutes).  Arriving late affects you and other patients whose appointments are after yours.  Also, if you miss three or more appointments without notifying the office, you may be dismissed from the clinic at the provider?s discretion.    ?  ?For prescription refill requests, have your pharmacy contact our office and allow 72 hours for refills to be completed.   ? ?Today you received the following chemotherapy and/or immunotherapy agent: Cemiplimab (Libtayo) ?  ?To help prevent nausea and vomiting after your treatment, we encourage you to take your nausea medication as directed. ? ?BELOW ARE SYMPTOMS THAT SHOULD BE REPORTED IMMEDIATELY: ?*FEVER GREATER THAN 100.4 F (38 ?C) OR HIGHER ?*CHILLS OR SWEATING ?*NAUSEA AND VOMITING THAT IS NOT CONTROLLED WITH YOUR NAUSEA MEDICATION ?*UNUSUAL SHORTNESS OF BREATH ?*UNUSUAL BRUISING OR BLEEDING ?*URINARY PROBLEMS (pain or burning when urinating, or frequent urination) ?*BOWEL PROBLEMS (unusual diarrhea, constipation, pain near the anus) ?TENDERNESS IN MOUTH AND THROAT WITH OR WITHOUT PRESENCE OF ULCERS (sore throat, sores in mouth, or a toothache) ?UNUSUAL RASH, SWELLING OR PAIN  ?UNUSUAL VAGINAL DISCHARGE OR ITCHING  ? ?Items with * indicate a potential emergency and should be followed up as soon as possible or go to the Emergency Department if any problems should occur. ? ?Please show the CHEMOTHERAPY ALERT CARD or IMMUNOTHERAPY ALERT CARD at  check-in to the Emergency Department and triage nurse. ? ?Should you have questions after your visit or need to cancel or reschedule your appointment, please contact [facility]  Dept: [phone]  and follow the prompts.  Office hours are 8:00 a.m. to 4:30 p.m. Monday - Friday. Please note that voicemails left after 4:00 p.m. may not be returned until the following business day.  We are closed weekends and major holidays. You have access to a nurse at all times for urgent questions. Please call the main number to the clinic Dept: [phone] and follow the prompts. ? ? ?For any non-urgent questions, you may also contact your provider using MyChart. We now offer e-Visits for anyone 47 and older to request care online for non-urgent symptoms. For details visit [email or web address]. ?  ?Also download the MyChart app! Go to the app store, search "MyChart", open the app, select [facility], and log in with your MyChart username and password. ? ?Due to Covid, a mask is required upon entering the hospital/clinic. If you do not have a mask, one will be given to you upon arrival. For doctor visits, patients may have 1 support person aged 8 or older with them. For treatment visits, patients cannot have anyone with them due to current Covid guidelines and our immunocompromised population.  ? ?

## 2021-08-21 NOTE — Progress Notes (Signed)
?    [facility] ?Telephone:(336) [phone]   [phone] ? ?OFFICE PROGRESS NOTE ? ?[name], MD ?[address] ?[place] [address] ? ?DIAGNOSIS: stage IV (TX, N2, M1 C) non-small cell lung cancer, adenocarcinoma diagnosed in August 2021 and presented with solitary brain metastasis in addition to mediastinal lymphadenopathy. ? ?PDL1 Expression 70% ?  ?Molecular Biomarkers:  ?Tumor Mutational Burden - 52 Muts/Mb ?Microsatellite status - MS-Stable ?Genomic Findings ?For a complete list of the genes assayed, please refer to the Appendix. ?NF1 E1694* ?MTAP loss exons 2-8 ?RICTOR amplification ?ATRX A419V ?BRAF K483E ?CDKN2A/B CDKN2A loss, CDKN2B loss ?DNMT3A E205* ?FGF10 amplification ?NTRK1 amplification - equivocal? ?7 Disease relevant genes with no reportable alterations: ALK, EGFR, ?ERBB2, KRAS, MET, RET, ROS1  ?  ?PRIOR THERAPY:  ?1) Status post right craniotomy with tumor resection followed by SRS to solitary brain metastasis under the care of Dr. Lisbeth Renshaw and Dr. Sherral Hammers. ?2) Concurrent chemoradiation with weekly carboplatin for AUC of 2 and paclitaxel 45 mg/M2.  First dose 02/01/2020. Status post 5 cycles.  Last dose was given February 29, 2020. ?  ?CURRENT THERAPY:  First-line treatment with immunotherapy with Libtayo (Cempilimab) 350 mg IV every 3 weeks.  First dose April 25, 2020.  Status post 22 cycles. ? ?INTERVAL HISTORY: ?Jared Shea. 66 y.o. male returns to the clinic today for follow-up visit.  The patient is feeling fine today with no concerning complaints except for mild fatigue.  He also has arthralgia in the knees and he received steroid injection recently.  He denied having any chest pain, shortness of breath, cough or hemoptysis.  He has no nausea, vomiting, diarrhea or constipation.  He has no headache or visual changes.  He denied having any significant weight loss or night sweats.  He has no fever or chills.  He is here today for evaluation before starting  cycle #23 of his treatment. ? ?MEDICAL HISTORY: ?Past Medical History:  ?Diagnosis Date  ? Brain mass   ? Bursitis of right hip   ? Chest pain 08/12/2018  ? Chronic cough   ? Dependence on nicotine from cigarettes   ? Diabetes mellitus without complication ([facility])   ? Essential hypertension 07/28/2018  ? nscl ca dx'd 12/2019  ? Seizures ([facility])   ? Viral illness 03/23/2019  ? ? ?ALLERGIES:  is allergic to penicillins. ? ?MEDICATIONS:  ?Current Outpatient Medications  ?Medication Sig Dispense Refill  ? albuterol (VENTOLIN HFA) 108 (90 Base) MCG/ACT inhaler Inhale 2 puffs into the lungs every 6 (six) hours as needed for wheezing or shortness of breath. 8 g 2  ? HYDROcodone-acetaminophen (NORCO) 5-325 MG tablet Take 1 tablet by mouth every 6 (six) hours as needed for moderate pain. 30 tablet 0  ? ibuprofen (ADVIL) 800 MG tablet Take 800 mg by mouth 3 (three) times daily.    ? levETIRAcetam (KEPPRA) 750 MG tablet Take 1 tablet (750 mg total) by mouth 2 (two) times daily. 180 tablet 4  ? losartan (COZAAR) 25 MG tablet Take 1 tablet (25 mg total) by mouth daily. 90 tablet 3  ? meloxicam (MOBIC) 7.5 MG tablet Take 1 tablet (7.5 mg total) by mouth daily. 90 tablet 0  ? nortriptyline (PAMELOR) 25 MG capsule TAKE 2 CAPSULES(50 MG) BY MOUTH AT BEDTIME 60 capsule 5  ? omeprazole (PRILOSEC) 20 MG capsule Take 20 mg by mouth daily. Pt takes in the morning    ? sildenafil (VIAGRA) 50 MG tablet Take 2 tablets (100 mg  total) by mouth daily as needed for erectile dysfunction. 30 tablet 3  ? ?No current facility-administered medications for this visit.  ? ? ?SURGICAL HISTORY:  ?Past Surgical History:  ?Procedure Laterality Date  ? APPLICATION OF CRANIAL NAVIGATION N/A 01/07/2020  ? Procedure: APPLICATION OF CRANIAL NAVIGATION;  Surgeon: Ashok Pall, MD;  Location: [facility];  Service: Neurosurgery;  Laterality: N/A;  ? CRANIOTOMY Right 01/07/2020  ? Procedure: RIGHT CRANIOTOMY FOR TUMOR RESECTION;  Surgeon: Ashok Pall, MD;  Location: [facility];   Service: Neurosurgery;  Laterality: Right;  rm 21  ? ENDOBRONCHIAL ULTRASOUND N/A 12/21/2019  ? Procedure: ENDOBRONCHIAL ULTRASOUND;  Surgeon: Laurin Coder, MD;  [facility];  Service: Pulmonary;  Laterality: N/A;  ? FINE NEEDLE ASPIRATION  12/21/2019  ? Procedure: FINE NEEDLE ASPIRATION (FNA) LINEAR;  Surgeon: Laurin Coder, MD;  [facility];  Service: Pulmonary;;  ? IR IMAGING GUIDED PORT INSERTION  03/03/2020  ? NO PAST SURGERIES    ? VIDEO BRONCHOSCOPY N/A 12/21/2019  ? Procedure: VIDEO BRONCHOSCOPY WITHOUT FLUORO;  Surgeon: Laurin Coder, MD;  [facility];  Service: Pulmonary;  Laterality: N/A;  ? ? ?REVIEW OF SYSTEMS:  A comprehensive review of systems was negative except for: Musculoskeletal: positive for arthralgias  ? ?PHYSICAL EXAMINATION: General appearance: alert, cooperative, and no distress ?Head: Normocephalic, without obvious abnormality, atraumatic ?Neck: no adenopathy, no JVD, supple, symmetrical, trachea midline, and thyroid not enlarged, symmetric, no tenderness/mass/nodules ?Lymph nodes: Cervical, supraclavicular, and axillary nodes normal. ?Resp: clear to auscultation bilaterally ?Back: symmetric, no curvature. ROM normal. No CVA tenderness. ?Cardio: regular rate and rhythm, S1, S2 normal, no murmur, click, rub or gallop ?GI: soft, non-tender; bowel sounds normal; no masses,  no organomegaly ?Extremities: extremities normal, atraumatic, no cyanosis or edema ? ?ECOG PERFORMANCE STATUS: 1 - Symptomatic but completely ambulatory ? ?Blood pressure 136/79, pulse 86, temperature (!) 97.1 ?F (36.2 ?C), temperature source Tympanic, resp. rate 18, height '6\' 1"'  (1.854 m), weight 198 lb 3.2 oz (89.9 kg), SpO2 100 %. ? ?LABORATORY DATA: ?Lab Results  ?Component Value Date  ? WBC 8.4 08/21/2021  ? HGB 12.4 (L) 08/21/2021  ? HCT 36.5 (L) 08/21/2021  ? MCV 86.5 08/21/2021  ? PLT 367 08/21/2021  ? ? ?  Chemistry   ?   ?Component Value Date/Time  ? NA 139 08/21/2021  1027  ? NA 140 11/23/2019 1516  ? K 3.6 08/21/2021 1027  ? CL 106 08/21/2021 1027  ? CO2 26 08/21/2021 1027  ? BUN 19 08/21/2021 1027  ? BUN 14 11/23/2019 1516  ? CREATININE 0.73 08/21/2021 1027  ?    ?Component Value Date/Time  ? CALCIUM 9.0 08/21/2021 1027  ? ALKPHOS 86 08/21/2021 1027  ? AST 23 08/21/2021 1027  ? ALT 36 08/21/2021 1027  ? BILITOT 0.7 08/21/2021 1027  ?  ? ? ? ?RADIOGRAPHIC STUDIES: ?No results found. ? ? ?ASSESSMENT AND PLAN: This is a very pleasant 66 years old African-American male recently diagnosed with a stage IV (TX, N2, M1c) non-small cell lung cancer, adenocarcinoma presented with solitary brain metastasis in addition to right hilar and mediastinal lymphadenopathy diagnosed in August 2021.  The patient is status post right craniotomy with resection of the solitary brain metastasis with SRS. ?His PET scan showed no evidence of metastatic disease outside the chest. ?The patient was treated with a course of concurrent chemoradiation with weekly carboplatin and paclitaxel status post 5 cycles.  He tolerated his treatment well except for dysphagia and odynophagia. ?  His PD-L1 expression is 70%. ?The patient started treatment with immunotherapy with Cemiplimab 350 mg IV every 3 weeks status post 22 cycles.  ?The patient has been tolerating this treatment well with no concerning adverse effects. ?I recommended for him to proceed with cycle #23 today as planned. ?I will see him back for follow-up visit in 3 weeks for evaluation with repeat CT scan of the chest, abdomen and pelvis for restaging of his disease. ?The patient was advised to call immediately if he has any other concerning symptoms in the interval. ?The patient voices understanding of current disease status and treatment options and is in agreement with the current care plan. ? ?All questions were answered. The patient knows to call the clinic with any problems, questions or concerns. We can certainly see the patient much sooner if  necessary. ? ? ?Disclaimer: This note was dictated with voice recognition software. Similar sounding words can inadvertently be transcribed and may not be corrected upon review. ? ? ?  ?  ?

## 2021-08-24 ENCOUNTER — Encounter: Payer: Medicare HMO | Admitting: Internal Medicine

## 2021-08-25 ENCOUNTER — Encounter: Payer: Self-pay | Admitting: Internal Medicine

## 2021-08-28 ENCOUNTER — Telehealth: Payer: Self-pay | Admitting: Physician Assistant

## 2021-08-28 NOTE — Telephone Encounter (Signed)
Called patient regarding upcoming appointment, patient is notified. °

## 2021-09-01 ENCOUNTER — Ambulatory Visit (HOSPITAL_COMMUNITY): Payer: Medicare HMO

## 2021-09-04 ENCOUNTER — Other Ambulatory Visit: Payer: Self-pay | Admitting: Internal Medicine

## 2021-09-04 DIAGNOSIS — M2550 Pain in unspecified joint: Secondary | ICD-10-CM

## 2021-09-04 NOTE — Telephone Encounter (Signed)
Patient should discuss knee pain again in clinic or with his orthopedist, have not discussed response to meloxicam since initial prescription ?

## 2021-09-06 ENCOUNTER — Ambulatory Visit (HOSPITAL_COMMUNITY)
Admission: RE | Admit: 2021-09-06 | Discharge: 2021-09-06 | Disposition: A | Discharge status: Home / Self Care | Payer: Medicare HMO | Source: Ambulatory Visit | Attending: Internal Medicine | Admitting: Internal Medicine

## 2021-09-06 DIAGNOSIS — C3490 Malignant neoplasm of unspecified part of unspecified bronchus or lung: Secondary | ICD-10-CM | POA: Diagnosis not present

## 2021-09-06 MED ORDER — HEPARIN SOD (PORK) LOCK FLUSH 100 UNIT/ML IV SOLN: 500.0000 [IU] | Freq: Once | INTRAVENOUS | Status: DC

## 2021-09-06 MED ORDER — IOHEXOL 300 MG/ML  SOLN
100.0000 mL | Freq: Once | INTRAMUSCULAR | Status: AC | PRN
  Administered 2021-09-06: 100 mL via INTRAVENOUS

## 2021-09-06 MED ORDER — HEPARIN SOD (PORK) LOCK FLUSH 100 UNIT/ML IV SOLN
INTRAVENOUS | Status: AC
Start: 2021-09-06 — End: 2021-09-06
  Administered 2021-09-06: 500 [IU]
  Filled 2021-09-06: qty 5

## 2021-09-07 ENCOUNTER — Other Ambulatory Visit: Payer: Self-pay | Admitting: Physician Assistant

## 2021-09-07 ENCOUNTER — Ambulatory Visit (INDEPENDENT_AMBULATORY_CARE_PROVIDER_SITE_OTHER): Payer: Medicare HMO | Admitting: Internal Medicine

## 2021-09-07 DIAGNOSIS — R6883 Chills (without fever): Secondary | ICD-10-CM | POA: Diagnosis not present

## 2021-09-07 DIAGNOSIS — R059 Cough, unspecified: Secondary | ICD-10-CM

## 2021-09-07 MED ORDER — BENZONATATE 100 MG PO CAPS: 100.0000 mg | ORAL_CAPSULE | Freq: Three times a day (TID) | ORAL | 2 refills | Status: DC | PRN

## 2021-09-07 NOTE — Progress Notes (Signed)
? ? ?Subjective:  ?CC: Chills ? ?HPI: ? ?[name] is a 66 y.o. male with a past medical history stated below and presents today for Chills. Please see problem based assessment and plan for additional details. ? ?Past Medical History:  ?Diagnosis Date  ? Brain mass   ? Bursitis of right hip   ? Chest pain 08/12/2018  ? Chronic cough   ? Dependence on nicotine from cigarettes   ? Diabetes mellitus without complication ([place])   ? Essential hypertension 07/28/2018  ? nscl ca dx'd 12/2019  ? Seizures ([place])   ? Viral illness 03/23/2019  ? ? ?Current Outpatient Medications on File Prior to Visit  ?Medication Sig Dispense Refill  ? albuterol (VENTOLIN HFA) 108 (90 Base) MCG/ACT inhaler Inhale 2 puffs into the lungs every 6 (six) hours as needed for wheezing or shortness of breath. 8 g 2  ? HYDROcodone-acetaminophen (NORCO) 5-325 MG tablet Take 1 tablet by mouth every 6 (six) hours as needed for moderate pain. 30 tablet 0  ? ibuprofen (ADVIL) 800 MG tablet Take 800 mg by mouth 3 (three) times daily.    ? levETIRAcetam (KEPPRA) 750 MG tablet Take 1 tablet (750 mg total) by mouth 2 (two) times daily. 180 tablet 4  ? losartan (COZAAR) 25 MG tablet Take 1 tablet (25 mg total) by mouth daily. 90 tablet 3  ? meloxicam (MOBIC) 7.5 MG tablet Take 1 tablet (7.5 mg total) by mouth daily. 90 tablet 0  ? nortriptyline (PAMELOR) 25 MG capsule TAKE 2 CAPSULES(50 MG) BY MOUTH AT BEDTIME 60 capsule 5  ? omeprazole (PRILOSEC) 20 MG capsule Take 20 mg by mouth daily. Pt takes in the morning    ? sildenafil (VIAGRA) 50 MG tablet Take 2 tablets (100 mg total) by mouth daily as needed for erectile dysfunction. 30 tablet 3  ? ?No current facility-administered medications on file prior to visit.  ? ? ?Family History  ?Problem Relation Age of Onset  ? Heart disease Mother   ?     CABG  ? Kidney disease Sister   ? Diabetes Brother   ? Kidney disease Brother   ?     KIDNEY TRANSPLANT  ? Hypertension Sister   ? Healthy Daughter   ? Healthy  Daughter   ? Healthy Son   ? ? ?Social History  ? ?Socioeconomic History  ? Marital status: Divorced  ?  Spouse name: Not on file  ? Number of children: Not on file  ? Years of education: Not on file  ? Highest education level: Not on file  ?Occupational History  ? Not on file  ?Tobacco Use  ? Smoking status: Former  ?  Packs/day: 1.00  ?  Years: 48.00  ?  Pack years: 48.00  ?  Types: Cigarettes  ?  Start date: 08/07/1970  ?  Quit date: 12/15/2019  ?  Years since quitting: 1.7  ? Smokeless tobacco: Never  ?Vaping Use  ? Vaping Use: Never used  ?Substance and Sexual Activity  ? Alcohol use: Not Currently  ? Drug use: Never  ? Sexual activity: Not on file  ?  Comment: YES  ?Other Topics Concern  ? Not on file  ?Social History Narrative  ? Not on file  ? ?Social Determinants of Health  ? ?Financial Resource Strain: Not on file  ?Food Insecurity: Not on file  ?Transportation Needs: Not on file  ?Physical Activity: Not on file  ?Stress: Not on file  ?Social Connections: Not  on file  ?Intimate Partner Violence: Not on file  ? ? ?Review of Systems: ?ROS negative except for what is noted on the assessment and plan. ? ?Objective:  ? ?Vitals:  ? 09/07/21 1321  ?BP: 137/75  ?Pulse: 84  ?Temp: 97.8 ?F (36.6 ?C)  ?TempSrc: Oral  ?SpO2: 100%  ?Weight: 195 lb (88.5 kg)  ?Height: 6\' 1"  (1.854 m)  ? ? ?Physical Exam: ?Gen: A&O x3 and in no apparent distress, well appearing and nourished. ?HEENT:  ?  Head - normocephalic, atraumatic.  ?  Eye - visual acuity grossly intact, conjunctiva clear, sclera non-icteric, EOM intact.  ?  Mouth - No obvious caries or periodontal disease. ?Neck: no masses or nodules, AROM intact., No LAD present ?CV: RRR, no murmurs, S1/S2 presents  ?Resp: Clear to ascultation bilaterally  ?Abd: mildly distended without organomegaly ?MSK: Grossly normal AROM and strength x4 extremities. ?Skin: good skin turgor, no rashes, unusual bruising, or prominent lesions.  ?Neuro: No focal deficits, grossly normal sensation  and coordination.  ?Psych: Oriented x3 and responding appropriately. Intact memory, normal mood, judgement, affect, and insight.  ? ? ?Assessment & Plan:  ?See Encounters Tab for problem based charting. ? ?Chills ?HPI: ?Patient presents for further evaluation and management of chills.  Patient states that he is experience chills several nights over the last week.  He states that he is only experiencing these chills at night before he goes to bed.  He denies any other associated symptoms with the chills.  He did state that he had 1 episode of sweating today on the way to go get his prescription.  He did not take his temperature during any of these episodes.  He denies any fatigue, shortness of breath, chest pain, increased cough from baseline or increased sputum production.  He denied any abdominal pain, bloating, diarrhea, nausea, constipation.  He denies any night sweats or weight loss.  Patient states that he does have follow-up with his oncologist on Monday.  Recent blood work was performed on 4/10 with a mild anemia but otherwise normal. ? ?Patient states that he is concerned that he has an infection in would like an antibiotic ? ?Patient had a normal heart and lung exam.  Abdomen was distended without significant organomegaly.  There is no lymphadenopathy felt anywhere.  Oropharynx is clear without rash or mucus. ?Assessment/Plan: ?Chills ?I offered the patient a repeat CBC to ensure good leukocyte count.  He stated that it would have to be through his port.  I informed him that we would not be able to draw blood from his port at our lab.  He declined peripheral blood draws ?-I reassured the patient that his symptoms do not sound concerning for infection ?-I counseled him regarding signs and symptoms of systemic inflammation and infections. ?- Gave strict return precautions and instructed him how to use a thermometer during these episodes ?-Informed him to keep his office appointment with oncology on Monday.                                                                                 ? ?Patient discussed with Dr.  Cain Sieve ? ? ?Marianna Payment, D.O. ?[facility]  PGY-3 ?Pager: [phone]  Phone: [phone] ?Date 09/08/2021  Time 12:22 PM  ?

## 2021-09-07 NOTE — Patient Instructions (Signed)
Thank you, [name] for allowing [place] to provide your care today. Today we discussed chills.   ? ?Labs/Tests Ordered: ?Lab Orders  ?No laboratory test(s) ordered today  ?  ? ?Referrals Ordered:  ?Referral Orders  ?No referral(s) requested today  ?  ? ?Medication Changes:  ?There are no discontinued medications.  ? ?No orders of the defined types were placed in this encounter. ?  ? ?Health Maintenance Screening: ?Diabetes Health Maintenance Due  ?Topic Date Due  ? OPHTHALMOLOGY EXAM  Never done  ? HEMOGLOBIN A1C  09/20/2021  ? FOOT EXAM  Discontinued  ?  ? ?Instructions: follow up with your oncologist ? ?Follow up:  as needed   ? ?Remember: If you have any questions or concerns, call our clinic at [phone] or after hours call [phone] and ask for the internal medicine resident on call. ? ?Jared Shea, D.O. ?[facility] ? ? ? ?

## 2021-09-08 ENCOUNTER — Encounter: Payer: Self-pay | Admitting: Internal Medicine

## 2021-09-08 DIAGNOSIS — R6883 Chills (without fever): Secondary | ICD-10-CM | POA: Insufficient documentation

## 2021-09-08 NOTE — Assessment & Plan Note (Signed)
HPI: ?Patient presents for further evaluation and management of chills.  Patient states that he is experience chills several nights over the last week.  He states that he is only experiencing these chills at night before he goes to bed.  He denies any other associated symptoms with the chills.  He did state that he had 1 episode of sweating today on the way to go get his prescription.  He did not take his temperature during any of these episodes.  He denies any fatigue, shortness of breath, chest pain, increased cough from baseline or increased sputum production.  He denied any abdominal pain, bloating, diarrhea, nausea, constipation.  He denies any night sweats or weight loss.  Patient states that he does have follow-up with his oncologist on Monday.  Recent blood work was performed on 4/10 with a mild anemia but otherwise normal. ? ?Patient states that he is concerned that he has an infection in would like an antibiotic ? ?Patient had a normal heart and lung exam.  Abdomen was distended without significant organomegaly.  There is no lymphadenopathy felt anywhere.  Oropharynx is clear without rash or mucus. ?Assessment/Plan: ?Chills ?I offered the patient a repeat CBC to ensure good leukocyte count.  He stated that it would have to be through his port.  I informed him that we would not be able to draw blood from his port at our lab.  He declined peripheral blood draws ?-I reassured the patient that his symptoms do not sound concerning for infection ?-I counseled him regarding signs and symptoms of systemic inflammation and infections. ?- Gave strict return precautions and instructed him how to use a thermometer during these episodes ?-Informed him to keep his office appointment with oncology on Monday.                                                                                ?

## 2021-09-08 NOTE — Progress Notes (Signed)
[facility] ?OFFICE PROGRESS NOTE ? ?Jared Presto, MD ?[address] ?[place] [address] ? ?DIAGNOSIS:  Stage IV (TX, N2, M1 C) non-small cell lung cancer, adenocarcinoma diagnosed in August 2021 and presented with solitary brain metastasis in addition to mediastinal lymphadenopathy. ?  ?PDL1 Expression 70% ?  ?Molecular Biomarkers:  ?Tumor Mutational Burden - 52 Muts/Mb ?Microsatellite status - MS-Stable ?Genomic Findings ?For a complete list of the genes assayed, please refer to the Appendix. ?NF1 E1694* ?MTAP loss exons 2-8 ?RICTOR amplification ?ATRX A419V ?BRAF K483E ?CDKN2A/B CDKN2A loss, CDKN2B loss ?DNMT3A E205* ?FGF10 amplification ?NTRK1 amplification - equivocal? ?7 Disease relevant genes with no reportable alterations: ALK, EGFR, ?ERBB2, KRAS, MET, RET, ROS1  ?  ?PRIOR THERAPY:  ?1) Status post right craniotomy with tumor resection followed by SRS to solitary brain metastasis under the care of Dr. Lisbeth Renshaw and Dr. Sherral Hammers. ?2) Concurrent chemoradiation with weekly carboplatin for AUC of 2 and paclitaxel 45 mg/M2.  First dose 02/01/2020. Status post 5 cycles.  Last dose was given February 29, 2020. ?  ?CURRENT THERAPY: First-line treatment with immunotherapy with Libtayo (Cempilimab) 350 mg IV every 3 weeks.  First dose April 25, 2020.  Status post 23 cycles. ? ?INTERVAL HISTORY: ?Jared Shea. 66 y.o. male returns to the clinic today for a follow-up visit.  The patient is feeling fairly well today without any new concerning complaints. Last week, the patient reported chills and a cough. He also notes he was using his inhaler more frequently as well. He saw his PCP on 09/08/21. He denies sick contacts. Denies sore throat. He was given tessalon which helped. He reports his chills have resolved since being seen by his PCP. He reports he is feeling much better today.  ? ? The patient has been having concerns related to arthralgias since starting immunotherapy.  He was finally agreeable  to seeing an orthopedic provider and he was found to have degenerative joint disease in his knees.  He received a joint injection with improvement in his symptoms. He receives injections about 1x every 3 months. The patient is presently taking Norco or ibuprofen for his knee pain.  He also uses Voltaren cream. He reports his arthralgias are under improved control today. He mentions that next time he sees his orthopedic provider, they discussed injecting gel into his knee.  ? ?He denies any fever, chills, or night sweats. He lost a few pounds since his last appointment. He notes he has not been eating as much. He has some occasional shortness of breath for which he will use his albuterol. Denies any nausea, vomiting, diarrhea, or constipation.  Denies any rashes or skin changes. He is followed closely by Dr. Mickeal Skinner for his history of metastatic disease to the brain. His MRI from last month was stable. His next brain MRI will be in June.  He recently had a restaging CT scan of the chest, abdomen, and pelvis.  He is here today for evaluation before starting cycle #24 ? ?MEDICAL HISTORY: ?Past Medical History:  ?Diagnosis Date  ? Brain mass   ? Bursitis of right hip   ? Chest pain 08/12/2018  ? Chronic cough   ? Dependence on nicotine from cigarettes   ? Diabetes mellitus without complication ([facility])   ? Essential hypertension 07/28/2018  ? nscl ca dx'd 12/2019  ? Seizures ([facility])   ? Viral illness 03/23/2019  ? ? ?ALLERGIES:  is allergic to penicillins. ? ?MEDICATIONS:  ?Current Outpatient Medications  ?Medication Sig  Dispense Refill  ? albuterol (VENTOLIN HFA) 108 (90 Base) MCG/ACT inhaler Inhale 2 puffs into the lungs every 6 (six) hours as needed for wheezing or shortness of breath. 8 g 2  ? benzonatate (TESSALON) 100 MG capsule Take 1 capsule (100 mg total) by mouth 3 (three) times daily as needed for cough. 30 capsule 2  ? HYDROcodone-acetaminophen (NORCO) 5-325 MG tablet Take 1 tablet by mouth every 6 (six) hours as  needed for moderate pain. 30 tablet 0  ? ibuprofen (ADVIL) 800 MG tablet Take 800 mg by mouth 3 (three) times daily.    ? levETIRAcetam (KEPPRA) 750 MG tablet Take 1 tablet (750 mg total) by mouth 2 (two) times daily. 180 tablet 4  ? losartan (COZAAR) 25 MG tablet Take 1 tablet (25 mg total) by mouth daily. 90 tablet 3  ? meloxicam (MOBIC) 7.5 MG tablet Take 1 tablet (7.5 mg total) by mouth daily. 90 tablet 0  ? nortriptyline (PAMELOR) 25 MG capsule TAKE 2 CAPSULES(50 MG) BY MOUTH AT BEDTIME 60 capsule 5  ? omeprazole (PRILOSEC) 20 MG capsule Take 20 mg by mouth daily. Pt takes in the morning    ? sildenafil (VIAGRA) 50 MG tablet Take 2 tablets (100 mg total) by mouth daily as needed for erectile dysfunction. 30 tablet 3  ? ?No current facility-administered medications for this visit.  ? ? ?SURGICAL HISTORY:  ?Past Surgical History:  ?Procedure Laterality Date  ? APPLICATION OF CRANIAL NAVIGATION N/A 01/07/2020  ? Procedure: APPLICATION OF CRANIAL NAVIGATION;  Surgeon: Ashok Pall, MD;  Location: [facility];  Service: Neurosurgery;  Laterality: N/A;  ? CRANIOTOMY Right 01/07/2020  ? Procedure: RIGHT CRANIOTOMY FOR TUMOR RESECTION;  Surgeon: Ashok Pall, MD;  Location: [facility];  Service: Neurosurgery;  Laterality: Right;  rm 21  ? ENDOBRONCHIAL ULTRASOUND N/A 12/21/2019  ? Procedure: ENDOBRONCHIAL ULTRASOUND;  Surgeon: Laurin Coder, MD;  Location: [facility];  Service: Pulmonary;  Laterality: N/A;  ? FINE NEEDLE ASPIRATION  12/21/2019  ? Procedure: FINE NEEDLE ASPIRATION (FNA) LINEAR;  Surgeon: Laurin Coder, MD;  Location: [facility];  Service: Pulmonary;;  ? IR IMAGING GUIDED PORT INSERTION  03/03/2020  ? NO PAST SURGERIES    ? VIDEO BRONCHOSCOPY N/A 12/21/2019  ? Procedure: VIDEO BRONCHOSCOPY WITHOUT FLUORO;  Surgeon: Laurin Coder, MD;  Location: [facility];  Service: Pulmonary;  Laterality: N/A;  ? ? ?REVIEW OF SYSTEMS:   ?Review of Systems  ?Constitutional: Positive for chills last week (resolved).  Positive for weight loss.  Negative for appetite change, and fever.  ?HENT: Negative for mouth sores, nosebleeds, sore throat and trouble swallowing.   ?Eyes: Positive for visual changes with bending over. ?Respiratory: Positive for cough last week (improving). Positive for intermittent spells of shortness of breath. Negative for hemoptysis and wheezing.   ?Cardiovascular: Negative for chest pain and leg swelling.  ?Gastrointestinal: Negative for abdominal pain, constipation, diarrhea, nausea and vomiting.  ?Genitourinary: Negative for bladder incontinence, difficulty urinating, dysuria, frequency and hematuria.   ?Musculoskeletal: Positive for polyarthralgia, particularly in the knees (improved compared to prior).  ?Skin: Negative for itching and rash.  ?Neurological: Negative for dizziness, headaches, extremity weakness, gait problem, light-headedness and seizures.  ?Hematological: Negative for adenopathy. Does not bruise/bleed easily.  ?Psychiatric/Behavioral: Positive for memory changes since undergoing treatment for metastatic disease to the brain. Negative for confusion, depression and sleep disturbance. The patient is not nervous/anxious. ?  ? ? ?PHYSICAL EXAMINATION:  ?Blood pressure 136/79, pulse 94, temperature 98.1 ?F (36.7 ?C),  temperature source Tympanic, resp. rate 16, weight 190 lb 11.2 oz (86.5 kg), SpO2 100 %. ? ?ECOG PERFORMANCE STATUS: 1 ? ?Physical Exam  ?Constitutional: Oriented to person, place, and time and well-developed, well-nourished, and in no distress.  ?HENT:  ?Head: Normocephalic and atraumatic.  ?Mouth/Throat: Oropharynx is clear and moist. No oropharyngeal exudate.  ?Eyes: Conjunctivae are normal. Right eye exhibits no discharge. Left eye exhibits no discharge. No scleral icterus.  ?Neck: Normal range of motion. Neck supple.  ?Cardiovascular: Normal rate, regular rhythm, normal heart sounds and intact distal pulses.   ?Pulmonary/Chest: Effort normal and breath sounds normal. No  respiratory distress. No wheezes. No rales.  ?Abdominal: Soft. Bowel sounds are normal. Exhibits no distension and no mass. There is no tenderness.  ?Musculoskeletal: Normal range of motion. Exhibits no edema.

## 2021-09-11 ENCOUNTER — Inpatient Hospital Stay: Payer: Medicare HMO | Attending: Internal Medicine

## 2021-09-11 ENCOUNTER — Inpatient Hospital Stay (HOSPITAL_BASED_OUTPATIENT_CLINIC_OR_DEPARTMENT_OTHER): Payer: Medicare HMO | Admitting: Physician Assistant

## 2021-09-11 ENCOUNTER — Other Ambulatory Visit: Payer: Self-pay

## 2021-09-11 ENCOUNTER — Inpatient Hospital Stay: Payer: Medicare HMO

## 2021-09-11 VITALS — BP 136/79 | HR 94 | Temp 98.1°F | Resp 16 | Wt 190.7 lb

## 2021-09-11 DIAGNOSIS — K4020 Bilateral inguinal hernia, without obstruction or gangrene, not specified as recurrent: Secondary | ICD-10-CM | POA: Diagnosis not present

## 2021-09-11 DIAGNOSIS — Z79899 Other long term (current) drug therapy: Secondary | ICD-10-CM | POA: Diagnosis not present

## 2021-09-11 DIAGNOSIS — Z791 Long term (current) use of non-steroidal anti-inflammatories (NSAID): Secondary | ICD-10-CM | POA: Diagnosis not present

## 2021-09-11 DIAGNOSIS — C7931 Secondary malignant neoplasm of brain: Secondary | ICD-10-CM | POA: Insufficient documentation

## 2021-09-11 DIAGNOSIS — C799 Secondary malignant neoplasm of unspecified site: Secondary | ICD-10-CM

## 2021-09-11 DIAGNOSIS — C3491 Malignant neoplasm of unspecified part of right bronchus or lung: Secondary | ICD-10-CM | POA: Diagnosis not present

## 2021-09-11 DIAGNOSIS — R590 Localized enlarged lymph nodes: Secondary | ICD-10-CM

## 2021-09-11 DIAGNOSIS — I700 Atherosclerosis of aorta: Secondary | ICD-10-CM | POA: Insufficient documentation

## 2021-09-11 DIAGNOSIS — Z923 Personal history of irradiation: Secondary | ICD-10-CM | POA: Insufficient documentation

## 2021-09-11 DIAGNOSIS — Z5112 Encounter for antineoplastic immunotherapy: Secondary | ICD-10-CM

## 2021-09-11 DIAGNOSIS — I10 Essential (primary) hypertension: Secondary | ICD-10-CM | POA: Diagnosis not present

## 2021-09-11 DIAGNOSIS — E119 Type 2 diabetes mellitus without complications: Secondary | ICD-10-CM | POA: Insufficient documentation

## 2021-09-11 DIAGNOSIS — M170 Bilateral primary osteoarthritis of knee: Secondary | ICD-10-CM | POA: Diagnosis not present

## 2021-09-11 DIAGNOSIS — C3490 Malignant neoplasm of unspecified part of unspecified bronchus or lung: Secondary | ICD-10-CM | POA: Diagnosis not present

## 2021-09-11 DIAGNOSIS — F17210 Nicotine dependence, cigarettes, uncomplicated: Secondary | ICD-10-CM | POA: Insufficient documentation

## 2021-09-11 DIAGNOSIS — Z9221 Personal history of antineoplastic chemotherapy: Secondary | ICD-10-CM | POA: Insufficient documentation

## 2021-09-11 DIAGNOSIS — Z95828 Presence of other vascular implants and grafts: Secondary | ICD-10-CM

## 2021-09-11 LAB — CBC WITH DIFFERENTIAL (CANCER CENTER ONLY)
Abs Immature Granulocytes: 0.03 10*3/uL (ref 0.00–0.07)
Basophils Absolute: 0 10*3/uL (ref 0.0–0.1)
Basophils Relative: 1 %
Eosinophils Absolute: 0.1 10*3/uL (ref 0.0–0.5)
Eosinophils Relative: 2 %
HCT: 36.8 % — ABNORMAL LOW (ref 39.0–52.0)
Hemoglobin: 12.5 g/dL — ABNORMAL LOW (ref 13.0–17.0)
Immature Granulocytes: 1 %
Lymphocytes Relative: 18 %
Lymphs Abs: 1.2 10*3/uL (ref 0.7–4.0)
MCH: 29.1 pg (ref 26.0–34.0)
MCHC: 34 g/dL (ref 30.0–36.0)
MCV: 85.8 fL (ref 80.0–100.0)
Monocytes Absolute: 0.6 10*3/uL (ref 0.1–1.0)
Monocytes Relative: 10 %
Neutro Abs: 4.5 10*3/uL (ref 1.7–7.7)
Neutrophils Relative %: 68 %
Platelet Count: 388 10*3/uL (ref 150–400)
RBC: 4.29 MIL/uL (ref 4.22–5.81)
RDW: 13.7 % (ref 11.5–15.5)
WBC Count: 6.5 10*3/uL (ref 4.0–10.5)
nRBC: 0 % (ref 0.0–0.2)

## 2021-09-11 LAB — CMP (CANCER CENTER ONLY)
ALT: 22 U/L (ref 0–44)
AST: 16 U/L (ref 15–41)
Albumin: 4.1 g/dL (ref 3.5–5.0)
Alkaline Phosphatase: 82 U/L (ref 38–126)
Anion gap: 8 (ref 5–15)
BUN: 14 mg/dL (ref 8–23)
CO2: 26 mmol/L (ref 22–32)
Calcium: 9.2 mg/dL (ref 8.9–10.3)
Chloride: 107 mmol/L (ref 98–111)
Creatinine: 0.79 mg/dL (ref 0.61–1.24)
GFR, Estimated: >60 mL/min (ref >60)
Glucose, Bld: 107 mg/dL — ABNORMAL HIGH (ref 70–99)
Potassium: 3.6 mmol/L (ref 3.5–5.1)
Sodium: 141 mmol/L (ref 135–145)
Total Bilirubin: 0.8 mg/dL (ref 0.3–1.2)
Total Protein: 8 g/dL (ref 6.5–8.1)

## 2021-09-11 LAB — TSH: TSH: 1.257 u[IU]/mL (ref 0.350–4.500)

## 2021-09-11 MED ORDER — SODIUM CHLORIDE 0.9% FLUSH
10.0000 mL | INTRAVENOUS | Status: DC | PRN
  Administered 2021-09-11: 10 mL

## 2021-09-11 MED ORDER — SODIUM CHLORIDE 0.9 % IV SOLN: Freq: Once | INTRAVENOUS | Status: AC

## 2021-09-11 MED ORDER — SODIUM CHLORIDE 0.9% FLUSH
10.0000 mL | Freq: Once | INTRAVENOUS | Status: AC
  Administered 2021-09-11: 10 mL

## 2021-09-11 MED ORDER — SODIUM CHLORIDE 0.9 % IV SOLN
350.0000 mg | Freq: Once | INTRAVENOUS | Status: AC
  Administered 2021-09-11: 350 mg via INTRAVENOUS
  Filled 2021-09-11: qty 7

## 2021-09-11 MED ORDER — HEPARIN SOD (PORK) LOCK FLUSH 100 UNIT/ML IV SOLN
500.0000 [IU] | Freq: Once | INTRAVENOUS | Status: AC | PRN
  Administered 2021-09-11: 500 [IU]

## 2021-09-11 NOTE — Patient Instructions (Signed)
[facility]  Discharge Instructions: ?Thank you for choosing [facility] to provide your oncology and hematology care.  ? ?If you have a lab appointment with the [facility], please go directly to the [facility] and check in at the registration area. ?  ?Wear comfortable clothing and clothing appropriate for easy access to any Portacath or PICC line.  ? ?We strive to give you quality time with your provider. You may need to reschedule your appointment if you arrive late (15 or more minutes).  Arriving late affects you and other patients whose appointments are after yours.  Also, if you miss three or more appointments without notifying the office, you may be dismissed from the clinic at the provider?s discretion.    ?  ?For prescription refill requests, have your pharmacy contact our office and allow 72 hours for refills to be completed.   ? ?Today you received the following chemotherapy and/or immunotherapy agent: Cemiplimab (Libtayo) ?  ?To help prevent nausea and vomiting after your treatment, we encourage you to take your nausea medication as directed. ? ?BELOW ARE SYMPTOMS THAT SHOULD BE REPORTED IMMEDIATELY: ?*FEVER GREATER THAN 100.4 F (38 ?C) OR HIGHER ?*CHILLS OR SWEATING ?*NAUSEA AND VOMITING THAT IS NOT CONTROLLED WITH YOUR NAUSEA MEDICATION ?*UNUSUAL SHORTNESS OF BREATH ?*UNUSUAL BRUISING OR BLEEDING ?*URINARY PROBLEMS (pain or burning when urinating, or frequent urination) ?*BOWEL PROBLEMS (unusual diarrhea, constipation, pain near the anus) ?TENDERNESS IN MOUTH AND THROAT WITH OR WITHOUT PRESENCE OF ULCERS (sore throat, sores in mouth, or a toothache) ?UNUSUAL RASH, SWELLING OR PAIN  ?UNUSUAL VAGINAL DISCHARGE OR ITCHING  ? ?Items with * indicate a potential emergency and should be followed up as soon as possible or go to the Emergency Department if any problems should occur. ? ?Please show the CHEMOTHERAPY ALERT CARD or IMMUNOTHERAPY ALERT CARD at  check-in to the Emergency Department and triage nurse. ? ?Should you have questions after your visit or need to cancel or reschedule your appointment, please contact [facility]  Dept: [phone]  and follow the prompts.  Office hours are 8:00 a.m. to 4:30 p.m. Monday - Friday. Please note that voicemails left after 4:00 p.m. may not be returned until the following business day.  We are closed weekends and major holidays. You have access to a nurse at all times for urgent questions. Please call the main number to the clinic Dept: [phone] and follow the prompts. ? ? ?For any non-urgent questions, you may also contact your provider using MyChart. We now offer e-Visits for anyone 1 and older to request care online for non-urgent symptoms. For details visit [email or web address]. ?  ?Also download the MyChart app! Go to the app store, search "MyChart", open the app, select [facility], and log in with your MyChart username and password. ? ?Due to Covid, a mask is required upon entering the hospital/clinic. If you do not have a mask, one will be given to you upon arrival. For doctor visits, patients may have 1 support person aged 42 or older with them. For treatment visits, patients cannot have anyone with them due to current Covid guidelines and our immunocompromised population.  ? ?

## 2021-09-11 NOTE — Progress Notes (Signed)
Internal Medicine Clinic Attending  Case discussed with Dr. Coe  At the time of the visit.  We reviewed the resident's history and exam and pertinent patient test results.  I agree with the assessment, diagnosis, and plan of care documented in the resident's note.  

## 2021-09-18 ENCOUNTER — Other Ambulatory Visit: Payer: Self-pay | Admitting: Internal Medicine

## 2021-09-18 DIAGNOSIS — C7931 Secondary malignant neoplasm of brain: Secondary | ICD-10-CM

## 2021-09-29 ENCOUNTER — Telehealth: Payer: Self-pay | Admitting: Orthopaedic Surgery

## 2021-09-29 ENCOUNTER — Telehealth: Payer: Self-pay

## 2021-09-29 NOTE — Telephone Encounter (Signed)
VOB submitted for Monovisc, bilateral knee. BV pending

## 2021-09-29 NOTE — Telephone Encounter (Signed)
Called and left a VM for patients sister to CB to schedule for bilateral knee, gel injections.

## 2021-09-29 NOTE — Telephone Encounter (Signed)
Last visit Dr Erlinda Hong stated to submit for Visco bilateral knee gel injections. Please call pt's sister once approved at [phone]

## 2021-10-02 ENCOUNTER — Other Ambulatory Visit: Payer: Self-pay

## 2021-10-02 ENCOUNTER — Inpatient Hospital Stay (HOSPITAL_BASED_OUTPATIENT_CLINIC_OR_DEPARTMENT_OTHER): Payer: Medicare HMO | Admitting: Internal Medicine

## 2021-10-02 ENCOUNTER — Inpatient Hospital Stay: Payer: Medicare HMO

## 2021-10-02 ENCOUNTER — Other Ambulatory Visit: Payer: Self-pay | Admitting: *Deleted

## 2021-10-02 VITALS — BP 159/84 | HR 78 | Temp 96.4°F | Resp 17 | Wt 190.4 lb

## 2021-10-02 DIAGNOSIS — Z95828 Presence of other vascular implants and grafts: Secondary | ICD-10-CM

## 2021-10-02 DIAGNOSIS — C799 Secondary malignant neoplasm of unspecified site: Secondary | ICD-10-CM

## 2021-10-02 DIAGNOSIS — C3490 Malignant neoplasm of unspecified part of unspecified bronchus or lung: Secondary | ICD-10-CM

## 2021-10-02 DIAGNOSIS — Z5112 Encounter for antineoplastic immunotherapy: Secondary | ICD-10-CM

## 2021-10-02 LAB — CMP (CANCER CENTER ONLY)
ALT: 26 U/L (ref 0–44)
AST: 20 U/L (ref 15–41)
Albumin: 4.3 g/dL (ref 3.5–5.0)
Alkaline Phosphatase: 77 U/L (ref 38–126)
Anion gap: 6 (ref 5–15)
BUN: 16 mg/dL (ref 8–23)
CO2: 27 mmol/L (ref 22–32)
Calcium: 9.4 mg/dL (ref 8.9–10.3)
Chloride: 107 mmol/L (ref 98–111)
Creatinine: 0.78 mg/dL (ref 0.61–1.24)
GFR, Estimated: >60 mL/min (ref >60)
Glucose, Bld: 102 mg/dL — ABNORMAL HIGH (ref 70–99)
Potassium: 3.7 mmol/L (ref 3.5–5.1)
Sodium: 140 mmol/L (ref 135–145)
Total Bilirubin: 0.7 mg/dL (ref 0.3–1.2)
Total Protein: 8.1 g/dL (ref 6.5–8.1)

## 2021-10-02 LAB — CBC WITH DIFFERENTIAL (CANCER CENTER ONLY)
Abs Immature Granulocytes: 0.03 10*3/uL (ref 0.00–0.07)
Basophils Absolute: 0 10*3/uL (ref 0.0–0.1)
Basophils Relative: 0 %
Eosinophils Absolute: 0.1 10*3/uL (ref 0.0–0.5)
Eosinophils Relative: 1 %
HCT: 36.5 % — ABNORMAL LOW (ref 39.0–52.0)
Hemoglobin: 12.6 g/dL — ABNORMAL LOW (ref 13.0–17.0)
Immature Granulocytes: 0 %
Lymphocytes Relative: 28 %
Lymphs Abs: 1.9 10*3/uL (ref 0.7–4.0)
MCH: 30.1 pg (ref 26.0–34.0)
MCHC: 34.5 g/dL (ref 30.0–36.0)
MCV: 87.1 fL (ref 80.0–100.0)
Monocytes Absolute: 0.5 10*3/uL (ref 0.1–1.0)
Monocytes Relative: 7 %
Neutro Abs: 4.2 10*3/uL (ref 1.7–7.7)
Neutrophils Relative %: 64 %
Platelet Count: 271 10*3/uL (ref 150–400)
RBC: 4.19 MIL/uL — ABNORMAL LOW (ref 4.22–5.81)
RDW: 15.7 % — ABNORMAL HIGH (ref 11.5–15.5)
WBC Count: 6.7 10*3/uL (ref 4.0–10.5)
nRBC: 0 % (ref 0.0–0.2)

## 2021-10-02 LAB — TSH: TSH: 0.943 u[IU]/mL (ref 0.350–4.500)

## 2021-10-02 MED ORDER — SODIUM CHLORIDE 0.9 % IV SOLN
350.0000 mg | Freq: Once | INTRAVENOUS | Status: AC
  Administered 2021-10-02: 350 mg via INTRAVENOUS
  Filled 2021-10-02: qty 7

## 2021-10-02 MED ORDER — HEPARIN SOD (PORK) LOCK FLUSH 100 UNIT/ML IV SOLN
500.0000 [IU] | Freq: Once | INTRAVENOUS | Status: AC | PRN
  Administered 2021-10-02: 500 [IU]

## 2021-10-02 MED ORDER — SODIUM CHLORIDE 0.9% FLUSH
10.0000 mL | INTRAVENOUS | Status: DC | PRN
  Administered 2021-10-02: 10 mL

## 2021-10-02 MED ORDER — SODIUM CHLORIDE 0.9 % IV SOLN: Freq: Once | INTRAVENOUS | Status: AC

## 2021-10-02 MED ORDER — SODIUM CHLORIDE 0.9% FLUSH
10.0000 mL | Freq: Once | INTRAVENOUS | Status: AC
  Administered 2021-10-02: 10 mL

## 2021-10-02 NOTE — Progress Notes (Signed)
[place] [phone]   Fax:([phone]) [phone]  OFFICE PROGRESS NOTE  [name], MD [place] [address]  DIAGNOSIS: stage IV (TX, N2, M1 C) non-small cell lung cancer, adenocarcinoma diagnosed in August 2021 and presented with solitary brain metastasis in addition to mediastinal lymphadenopathy.  PDL1 Expression 70%   Molecular Biomarkers:  Tumor Mutational Burden - 52 Muts/Mb Microsatellite status - MS-Stable Genomic Findings For a complete list of the genes assayed, please refer to the Appendix. NF1 E1694* MTAP loss exons 2-8 RICTOR amplification ATRX A419V BRAF K483E CDKN2A/B CDKN2A loss, CDKN2B loss DNMT3A E205* FGF10 amplification NTRK1 amplification - equivocal? 7 Disease relevant genes with no reportable alterations: ALK, EGFR, ERBB2, KRAS, MET, RET, ROS1    PRIOR THERAPY:  1) Status post right craniotomy with tumor resection followed by [facility] to solitary brain metastasis under the care of Dr. Lisbeth Renshaw and Dr. Sherral Hammers. 2) Concurrent chemoradiation with weekly carboplatin for AUC of 2 and paclitaxel 45 mg/M2.  First dose 02/01/2020. Status post 5 cycles.  Last dose was given February 29, 2020.   CURRENT THERAPY:  First-line treatment with immunotherapy with Libtayo (Cempilimab) 350 mg IV every 3 weeks.  First dose April 25, 2020.  Status post 24 cycles.  INTERVAL HISTORY: Jared Shea. 66 y.o. male returns to the clinic today for follow-up visit.  The patient is feeling fine today with no concerning complaints except for the arthralgia of his knees.  He will see his orthopedic surgeon again for evaluation.  He denied having any current chest pain, shortness of breath, cough or hemoptysis.  He has no nausea, vomiting, diarrhea or constipation.  He has no headache or visual changes.  He has no recent weight loss or night sweats.  He is here today for evaluation before starting cycle #25 of his treatment.  MEDICAL  HISTORY: Past Medical History:  Diagnosis Date   Brain mass    Bursitis of right hip    Chest pain 08/12/2018   Chronic cough    Dependence on nicotine from cigarettes    Diabetes mellitus without complication ([place])    Essential hypertension 07/28/2018   nscl ca dx'd 12/2019   Seizures ([place])    Viral illness 03/23/2019    ALLERGIES:  is allergic to penicillins.  MEDICATIONS:  Current Outpatient Medications  Medication Sig Dispense Refill   albuterol (VENTOLIN HFA) 108 (90 Base) MCG/ACT inhaler Inhale 2 puffs into the lungs every 6 (six) hours as needed for wheezing or shortness of breath. 8 g 2   benzonatate (TESSALON) 100 MG capsule Take 1 capsule (100 mg total) by mouth 3 (three) times daily as needed for cough. 30 capsule 2   HYDROcodone-acetaminophen (NORCO) 5-325 MG tablet Take 1 tablet by mouth every 6 (six) hours as needed for moderate pain. 30 tablet 0   ibuprofen (ADVIL) 800 MG tablet Take 800 mg by mouth 3 (three) times daily.     levETIRAcetam (KEPPRA) 750 MG tablet Take 1 tablet (750 mg total) by mouth 2 (two) times daily. 180 tablet 4   losartan (COZAAR) 25 MG tablet Take 1 tablet (25 mg total) by mouth daily. 90 tablet 3   meloxicam (MOBIC) 7.5 MG tablet Take 1 tablet (7.5 mg total) by mouth daily. 90 tablet 0   nortriptyline (PAMELOR) 25 MG capsule TAKE 2 CAPSULES(50 MG) BY MOUTH AT BEDTIME 60 capsule 5   omeprazole (PRILOSEC) 20 MG capsule Take 20 mg by mouth daily. Pt  takes in the morning     sildenafil (VIAGRA) 50 MG tablet Take 2 tablets (100 mg total) by mouth daily as needed for erectile dysfunction. 30 tablet 3   No current facility-administered medications for this visit.    SURGICAL HISTORY:  Past Surgical History:  Procedure Laterality Date   APPLICATION OF CRANIAL NAVIGATION N/A 01/07/2020   Procedure: APPLICATION OF CRANIAL NAVIGATION;  Surgeon: Ashok Pall, MD;  Location: [facility];  Service: Neurosurgery;  Laterality: N/A;   CRANIOTOMY Right 01/07/2020    Procedure: RIGHT CRANIOTOMY FOR TUMOR RESECTION;  Surgeon: Ashok Pall, MD;  Location: [facility];  Service: Neurosurgery;  Laterality: Right;  rm 21   ENDOBRONCHIAL ULTRASOUND N/A 12/21/2019   Procedure: ENDOBRONCHIAL ULTRASOUND;  Surgeon: Laurin Coder, MD;  Location: [facility];  Service: Pulmonary;  Laterality: N/A;   FINE NEEDLE ASPIRATION  12/21/2019   Procedure: FINE NEEDLE ASPIRATION (FNA) LINEAR;  Surgeon: Laurin Coder, MD;  Location: [facility];  Service: Pulmonary;;   IR IMAGING GUIDED PORT INSERTION  03/03/2020   NO PAST SURGERIES     VIDEO BRONCHOSCOPY N/A 12/21/2019   Procedure: VIDEO BRONCHOSCOPY WITHOUT FLUORO;  Surgeon: Laurin Coder, MD;  Location: [facility];  Service: Pulmonary;  Laterality: N/A;    REVIEW OF SYSTEMS:  A comprehensive review of systems was negative except for: Musculoskeletal: positive for arthralgias   PHYSICAL EXAMINATION: General appearance: alert, cooperative, and no distress Head: Normocephalic, without obvious abnormality, atraumatic Neck: no adenopathy, no JVD, supple, symmetrical, trachea midline, and thyroid not enlarged, symmetric, no tenderness/mass/nodules Lymph nodes: Cervical, supraclavicular, and axillary nodes normal. Resp: clear to auscultation bilaterally Back: symmetric, no curvature. ROM normal. No CVA tenderness. Cardio: regular rate and rhythm, S1, S2 normal, no murmur, click, rub or gallop GI: soft, non-tender; bowel sounds normal; no masses,  no organomegaly Extremities: extremities normal, atraumatic, no cyanosis or edema  ECOG PERFORMANCE STATUS: 1 - Symptomatic but completely ambulatory  Blood pressure (!) 159/84, pulse 78, temperature (!) 96.4 F (35.8 C), temperature source Tympanic, resp. rate 17, weight 190 lb 7 oz (86.4 kg), SpO2 99 %.  LABORATORY DATA: Lab Results  Component Value Date   WBC 6.5 09/11/2021   HGB 12.5 (L) 09/11/2021   HCT 36.8 (L) 09/11/2021   MCV 85.8 09/11/2021   PLT 388 09/11/2021       Chemistry      Component Value Date/Time   NA 141 09/11/2021 1129   NA 140 11/23/2019 1516   K 3.6 09/11/2021 1129   CL 107 09/11/2021 1129   CO2 26 09/11/2021 1129   BUN 14 09/11/2021 1129   BUN 14 11/23/2019 1516   CREATININE 0.79 09/11/2021 1129      Component Value Date/Time   CALCIUM 9.2 09/11/2021 1129   ALKPHOS 82 09/11/2021 1129   AST 16 09/11/2021 1129   ALT 22 09/11/2021 1129   BILITOT 0.8 09/11/2021 1129       RADIOGRAPHIC STUDIES: CT Chest W Contrast  Result Date: 09/06/2021 CLINICAL DATA:  Non-small cell lung cancer restaging. EXAM: CT CHEST, ABDOMEN, AND PELVIS WITH CONTRAST TECHNIQUE: Multidetector CT imaging of the chest, abdomen and pelvis was performed following the standard protocol during bolus administration of intravenous contrast. RADIATION DOSE REDUCTION: This exam was performed according to the departmental dose-optimization program which includes automated exposure control, adjustment of the mA and/or kV according to patient size and/or use of iterative reconstruction technique. CONTRAST:  171m OMNIPAQUE IOHEXOL 300 MG/ML  SOLN COMPARISON:  06/09/2021 FINDINGS: CT  CHEST FINDINGS Cardiovascular: The heart size is normal. No pericardial effusion. Aortic atherosclerosis and coronary artery calcifications. No pericardial effusion. Mediastinum/Nodes: Thyroid gland, trachea, and esophagus demonstrate no significant findings. No enlarged axillary or supraclavicular lymph nodes. No mediastinal or hilar adenopathy. Lungs/Pleura: No pleural effusion. Mild fibrotic changes identified within the perihilar left lung and medial left lower lobe, unchanged from the previous exam. There is also a focal area of scarring within the anteromedial left upper lobe which is similar to previous study. There is mild diffuse interstitial reticulation which appears new from previous exam and may be inflammatory in etiology. No suspicious pulmonary nodule or mass identified.  Musculoskeletal: No chest wall mass or suspicious bone lesions identified. CT ABDOMEN PELVIS FINDINGS Hepatobiliary: No focal liver abnormality is seen. No gallstones, gallbladder wall thickening, or biliary dilatation. Pancreas: Unremarkable. No pancreatic ductal dilatation or surrounding inflammatory changes. Spleen: Normal in size without focal abnormality. Adrenals/Urinary Tract: Adrenal glands are unremarkable. Kidneys are normal, without renal calculi, focal lesion, or hydronephrosis. Bladder is unremarkable. Stomach/Bowel: Stomach is within normal limits. Appendix appears normal. No evidence of bowel wall thickening, distention, or inflammatory changes. Vascular/Lymphatic: Aortic atherosclerosis. Previously referenced portacaval lymph node is unchanged with short axis of 1.1 cm, image 61/2. No other suspicious or enlarged lymph nodes identified within the abdomen or pelvis. Reproductive: Prostate is unremarkable. Other: No free fluid or fluid collections. Small fat containing bilateral inguinal hernias. Musculoskeletal: No acute or significant osseous findings. IMPRESSION: 1. Stable CT of the chest, abdomen and pelvis. No specific findings identified to suggest residual or recurrent tumor or metastatic disease. 2. Mild diffuse interstitial reticulation is new from previous exam and may be inflammatory in etiology. Correlate for any clinical signs or symptoms of early hypersensitivity pneumonitis. 3. Aortic Atherosclerosis (ICD10-I70.0). Electronically Signed   By: Kerby Moors M.D.   On: 09/06/2021 20:18   CT Abdomen Pelvis W Contrast  Result Date: 09/06/2021 CLINICAL DATA:  Non-small cell lung cancer restaging. EXAM: CT CHEST, ABDOMEN, AND PELVIS WITH CONTRAST TECHNIQUE: Multidetector CT imaging of the chest, abdomen and pelvis was performed following the standard protocol during bolus administration of intravenous contrast. RADIATION DOSE REDUCTION: This exam was performed according to the  departmental dose-optimization program which includes automated exposure control, adjustment of the mA and/or kV according to patient size and/or use of iterative reconstruction technique. CONTRAST:  118m OMNIPAQUE IOHEXOL 300 MG/ML  SOLN COMPARISON:  06/09/2021 FINDINGS: CT CHEST FINDINGS Cardiovascular: The heart size is normal. No pericardial effusion. Aortic atherosclerosis and coronary artery calcifications. No pericardial effusion. Mediastinum/Nodes: Thyroid gland, trachea, and esophagus demonstrate no significant findings. No enlarged axillary or supraclavicular lymph nodes. No mediastinal or hilar adenopathy. Lungs/Pleura: No pleural effusion. Mild fibrotic changes identified within the perihilar left lung and medial left lower lobe, unchanged from the previous exam. There is also a focal area of scarring within the anteromedial left upper lobe which is similar to previous study. There is mild diffuse interstitial reticulation which appears new from previous exam and may be inflammatory in etiology. No suspicious pulmonary nodule or mass identified. Musculoskeletal: No chest wall mass or suspicious bone lesions identified. CT ABDOMEN PELVIS FINDINGS Hepatobiliary: No focal liver abnormality is seen. No gallstones, gallbladder wall thickening, or biliary dilatation. Pancreas: Unremarkable. No pancreatic ductal dilatation or surrounding inflammatory changes. Spleen: Normal in size without focal abnormality. Adrenals/Urinary Tract: Adrenal glands are unremarkable. Kidneys are normal, without renal calculi, focal lesion, or hydronephrosis. Bladder is unremarkable. Stomach/Bowel: Stomach is within normal limits. Appendix appears normal.  No evidence of bowel wall thickening, distention, or inflammatory changes. Vascular/Lymphatic: Aortic atherosclerosis. Previously referenced portacaval lymph node is unchanged with short axis of 1.1 cm, image 61/2. No other suspicious or enlarged lymph nodes identified within the  abdomen or pelvis. Reproductive: Prostate is unremarkable. Other: No free fluid or fluid collections. Small fat containing bilateral inguinal hernias. Musculoskeletal: No acute or significant osseous findings. IMPRESSION: 1. Stable CT of the chest, abdomen and pelvis. No specific findings identified to suggest residual or recurrent tumor or metastatic disease. 2. Mild diffuse interstitial reticulation is new from previous exam and may be inflammatory in etiology. Correlate for any clinical signs or symptoms of early hypersensitivity pneumonitis. 3. Aortic Atherosclerosis (ICD10-I70.0). Electronically Signed   By: Kerby Moors M.D.   On: 09/06/2021 20:18     ASSESSMENT AND PLAN: This is a very pleasant 66 years old African-American male recently diagnosed with a stage IV (TX, N2, M1c) non-small cell lung cancer, adenocarcinoma presented with solitary brain metastasis in addition to right hilar and mediastinal lymphadenopathy diagnosed in August 2021.  The patient is status post right craniotomy with resection of the solitary brain metastasis with SRS. His PET scan showed no evidence of metastatic disease outside the chest. The patient was treated with a course of concurrent chemoradiation with weekly carboplatin and paclitaxel status post 5 cycles.  He tolerated his treatment well except for dysphagia and odynophagia. His PD-L1 expression is 70%. The patient started treatment with immunotherapy with Cemiplimab 350 mg IV every 3 weeks status post 24 cycles.  The patient continues to tolerate his treatment well with no concerning adverse effects. I recommended for him to proceed with cycle #25 today as planned. I will see him back for follow-up visit in 3 weeks for evaluation before the next cycle of his treatment. The patient was advised to call immediately if he has any concerning symptoms in the interval. The patient voices understanding of current disease status and treatment options and is in  agreement with the current care plan.  All questions were answered. The patient knows to call the clinic with any problems, questions or concerns. We can certainly see the patient much sooner if necessary.   Disclaimer: This note was dictated with voice recognition software. Similar sounding words can inadvertently be transcribed and may not be corrected upon review.

## 2021-10-02 NOTE — Patient Instructions (Signed)
[facility]  Discharge Instructions: Thank you for choosing [facility] to provide your oncology and hematology care.   If you have a lab appointment with the [facility], please go directly to the [facility] and check in at the registration area.   Wear comfortable clothing and clothing appropriate for easy access to any Portacath or PICC line.   We strive to give you quality time with your provider. You may need to reschedule your appointment if you arrive late (15 or more minutes).  Arriving late affects you and other patients whose appointments are after yours.  Also, if you miss three or more appointments without notifying the office, you may be dismissed from the clinic at the provider's discretion.      For prescription refill requests, have your pharmacy contact our office and allow 72 hours for refills to be completed.    Today you received the following chemotherapy and/or immunotherapy agents: Libtayo      To help prevent nausea and vomiting after your treatment, we encourage you to take your nausea medication as directed.  BELOW ARE SYMPTOMS THAT SHOULD BE REPORTED IMMEDIATELY: *FEVER GREATER THAN 100.4 F (38 C) OR HIGHER *CHILLS OR SWEATING *NAUSEA AND VOMITING THAT IS NOT CONTROLLED WITH YOUR NAUSEA MEDICATION *UNUSUAL SHORTNESS OF BREATH *UNUSUAL BRUISING OR BLEEDING *URINARY PROBLEMS (pain or burning when urinating, or frequent urination) *BOWEL PROBLEMS (unusual diarrhea, constipation, pain near the anus) TENDERNESS IN MOUTH AND THROAT WITH OR WITHOUT PRESENCE OF ULCERS (sore throat, sores in mouth, or a toothache) UNUSUAL RASH, SWELLING OR PAIN  UNUSUAL VAGINAL DISCHARGE OR ITCHING   Items with * indicate a potential emergency and should be followed up as soon as possible or go to the Emergency Department if any problems should occur.  Please show the CHEMOTHERAPY ALERT CARD or IMMUNOTHERAPY ALERT CARD at check-in to  the Emergency Department and triage nurse.  Should you have questions after your visit or need to cancel or reschedule your appointment, please contact [facility]  Dept: [phone]  and follow the prompts.  Office hours are 8:00 a.m. to 4:30 p.m. Monday - Friday. Please note that voicemails left after 4:00 p.m. may not be returned until the following business day.  We are closed weekends and major holidays. You have access to a nurse at all times for urgent questions. Please call the main number to the clinic Dept: [phone] and follow the prompts.   For any non-urgent questions, you may also contact your provider using MyChart. We now offer e-Visits for anyone 47 and older to request care online for non-urgent symptoms. For details visit [email or web address].   Also download the MyChart app! Go to the app store, search "MyChart", open the app, select [facility], and log in with your MyChart username and password.  Due to Covid, a mask is required upon entering the hospital/clinic. If you do not have a mask, one will be given to you upon arrival. For doctor visits, patients may have 1 support person aged 66 or older with them. For treatment visits, patients cannot have anyone with them due to current Covid guidelines and our immunocompromised population.

## 2021-10-03 ENCOUNTER — Other Ambulatory Visit: Payer: Self-pay

## 2021-10-03 DIAGNOSIS — M1712 Unilateral primary osteoarthritis, left knee: Secondary | ICD-10-CM

## 2021-10-03 DIAGNOSIS — M1711 Unilateral primary osteoarthritis, right knee: Secondary | ICD-10-CM

## 2021-10-13 ENCOUNTER — Ambulatory Visit
Admission: RE | Admit: 2021-10-13 | Discharge: 2021-10-13 | Disposition: A | Discharge status: Home / Self Care | Payer: Medicare HMO | Source: Ambulatory Visit | Attending: Internal Medicine | Admitting: Internal Medicine

## 2021-10-13 DIAGNOSIS — C7931 Secondary malignant neoplasm of brain: Secondary | ICD-10-CM

## 2021-10-13 DIAGNOSIS — Z95828 Presence of other vascular implants and grafts: Secondary | ICD-10-CM

## 2021-10-13 MED ORDER — SODIUM CHLORIDE 0.9% FLUSH
10.0000 mL | INTRAVENOUS | Status: DC | PRN
  Administered 2021-10-13: 10 mL via INTRAVENOUS

## 2021-10-13 MED ORDER — HEPARIN SOD (PORK) LOCK FLUSH 100 UNIT/ML IV SOLN
500.0000 [IU] | Freq: Once | INTRAVENOUS | Status: AC
  Administered 2021-10-13: 500 [IU] via INTRAVENOUS

## 2021-10-13 MED ORDER — GADOBENATE DIMEGLUMINE 529 MG/ML IV SOLN
18.0000 mL | Freq: Once | INTRAVENOUS | Status: AC | PRN
  Administered 2021-10-13: 18 mL via INTRAVENOUS

## 2021-10-14 NOTE — Progress Notes (Signed)
[facility] OFFICE PROGRESS NOTE  [name], MD [place] [address]  DIAGNOSIS: Stage IV (TX, N2, M1 C) non-small cell lung cancer, adenocarcinoma diagnosed in August 2021 and presented with solitary brain metastasis in addition to mediastinal lymphadenopathy.   PDL1 Expression 70%   Molecular Biomarkers:  Tumor Mutational Burden - 52 Muts/Mb Microsatellite status - MS-Stable Genomic Findings For a complete list of the genes assayed, please refer to the Appendix. NF1 E1694* MTAP loss exons 2-8 RICTOR amplification ATRX A419V BRAF K483E CDKN2A/B CDKN2A loss, CDKN2B loss DNMT3A E205* FGF10 amplification NTRK1 amplification - equivocal? 7 Disease relevant genes with no reportable alterations: ALK, EGFR, ERBB2, KRAS, MET, RET, ROS1   PRIOR THERAPY: 1) Status post right craniotomy with tumor resection followed by [facility] to solitary brain metastasis under the care of Dr. Lisbeth Renshaw and Dr. Sherral Hammers. 2) Concurrent chemoradiation with weekly carboplatin for AUC of 2 and paclitaxel 45 mg/M2.  First dose 02/01/2020. Status post 5 cycles.  Last dose was given February 29, 2020.  CURRENT THERAPY: First-line treatment with immunotherapy with Libtayo (Cempilimab) 350 mg IV every 3 weeks.  First dose April 25, 2020.  Status post 25 cycles.  INTERVAL HISTORY: Jared Shea. 66 y.o. male returns to the clinic today for a follow-up visit. He denies new concerns today. Previously, the patient has been having concerns related to arthralgias since starting immunotherapy.  He was finally agreeable to seeing an orthopedic provider and he was found to have degenerative joint disease in his knees.  He received a joint injection with improvement in his symptoms. He receives injections about 1x every 3 months. The patient is presently taking Norco or ibuprofen for his knee pain.  He also uses Voltaren cream.   Regarding his treatment, he denies any fever, chills, or night  sweats. He has some occasional shortness of breath for which he will use his albuterol. His cough has improved with tessalon. He denies chest discomfort or hemoptysis. Denies any nausea, vomiting, diarrhea, or constipation.  Denies any rashes or skin changes. He is followed closely by Dr. Mickeal Skinner for his history of metastatic disease to the brain. He recently had a repeat brain MRI and follow up with Dr. Mickeal Skinner.   He is here today for evaluation before starting cycle #26    MEDICAL HISTORY: Past Medical History:  Diagnosis Date   Brain mass    Bursitis of right hip    Chest pain 08/12/2018   Chronic cough    Dependence on nicotine from cigarettes    Diabetes mellitus without complication ([place])    Essential hypertension 07/28/2018   nscl ca dx'd 12/2019   Seizures ([place])    Viral illness 03/23/2019    ALLERGIES:  is allergic to penicillins.  MEDICATIONS:  Current Outpatient Medications  Medication Sig Dispense Refill   albuterol (VENTOLIN HFA) 108 (90 Base) MCG/ACT inhaler Inhale 2 puffs into the lungs every 6 (six) hours as needed for wheezing or shortness of breath. 8 g 2   benzonatate (TESSALON) 100 MG capsule Take 1 capsule (100 mg total) by mouth 3 (three) times daily as needed for cough. 30 capsule 2   HYDROcodone-acetaminophen (NORCO) 5-325 MG tablet Take 1 tablet by mouth every 6 (six) hours as needed for moderate pain. 30 tablet 0   ibuprofen (ADVIL) 800 MG tablet Take 800 mg by mouth 3 (three) times daily.     levETIRAcetam (KEPPRA) 750 MG tablet Take 1 tablet (750 mg total) by mouth  2 (two) times daily. 180 tablet 4   losartan (COZAAR) 25 MG tablet Take 1 tablet (25 mg total) by mouth daily. 90 tablet 3   meloxicam (MOBIC) 7.5 MG tablet Take 1 tablet (7.5 mg total) by mouth daily. 90 tablet 0   nortriptyline (PAMELOR) 25 MG capsule TAKE 2 CAPSULES(50 MG) BY MOUTH AT BEDTIME 60 capsule 5   omeprazole (PRILOSEC) 20 MG capsule Take 20 mg by mouth daily. Pt takes in the morning      sildenafil (VIAGRA) 50 MG tablet Take 2 tablets (100 mg total) by mouth daily as needed for erectile dysfunction. 30 tablet 3   No current facility-administered medications for this visit.    SURGICAL HISTORY:  Past Surgical History:  Procedure Laterality Date   APPLICATION OF CRANIAL NAVIGATION N/A 01/07/2020   Procedure: APPLICATION OF CRANIAL NAVIGATION;  Surgeon: Ashok Pall, MD;  Location: [facility];  Service: Neurosurgery;  Laterality: N/A;   CRANIOTOMY Right 01/07/2020   Procedure: RIGHT CRANIOTOMY FOR TUMOR RESECTION;  Surgeon: Ashok Pall, MD;  Location: [facility];  Service: Neurosurgery;  Laterality: Right;  rm 21   ENDOBRONCHIAL ULTRASOUND N/A 12/21/2019   Procedure: ENDOBRONCHIAL ULTRASOUND;  Surgeon: Laurin Coder, MD;  Location: [facility];  Service: Pulmonary;  Laterality: N/A;   FINE NEEDLE ASPIRATION  12/21/2019   Procedure: FINE NEEDLE ASPIRATION (FNA) LINEAR;  Surgeon: Laurin Coder, MD;  Location: [facility];  Service: Pulmonary;;   IR IMAGING GUIDED PORT INSERTION  03/03/2020   NO PAST SURGERIES     VIDEO BRONCHOSCOPY N/A 12/21/2019   Procedure: VIDEO BRONCHOSCOPY WITHOUT FLUORO;  Surgeon: Laurin Coder, MD;  Location: [facility];  Service: Pulmonary;  Laterality: N/A;    REVIEW OF SYSTEMS:   Review of Systems  Constitutional: Negative for appetite change, chills, fatigue, fever and unexpected weight change.  HENT:   Negative for mouth sores, nosebleeds, sore throat and trouble swallowing.   Eyes: Negative for eye problems and icterus.  Respiratory: Positive for improving cough.  Positive for intermittent shortness of breath.  Negative for hemoptysis and wheezing.   Cardiovascular: Negative for chest pain and leg swelling.  Gastrointestinal: Negative for abdominal pain, constipation, diarrhea, nausea and vomiting.  Genitourinary: Negative for bladder incontinence, difficulty urinating, dysuria, frequency and hematuria.   Musculoskeletal: Positive for  polyarthralgia, particularly in the knees. Skin: Negative for itching and rash.  Neurological: Negative for dizziness, extremity weakness, gait problem, headaches, light-headedness and seizures.  Hematological: Negative for adenopathy. Does not bruise/bleed easily.  Psychiatric/Behavioral: Negative for confusion, depression and sleep disturbance. The patient is not nervous/anxious.     PHYSICAL EXAMINATION:  Blood pressure (!) 159/83, pulse 83, temperature (!) 97.5 F (36.4 C), temperature source Oral, resp. rate 18, height '6\' 1"'  (1.854 m), weight 191 lb 14.4 oz (87 kg), SpO2 99 %.  ECOG PERFORMANCE STATUS: 1  Physical Exam  Constitutional: Oriented to person, place, and time and well-developed, well-nourished, and in no distress.  HENT:  Head: Normocephalic and atraumatic.  Mouth/Throat: Oropharynx is clear and moist. No oropharyngeal exudate.  Eyes: Conjunctivae are normal. Right eye exhibits no discharge. Left eye exhibits no discharge. No scleral icterus.  Neck: Normal range of motion. Neck supple.  Cardiovascular: Normal rate, regular rhythm, normal heart sounds and intact distal pulses.   Pulmonary/Chest: Effort normal and breath sounds normal. No respiratory distress. No wheezes. No rales.  Abdominal: Soft. Bowel sounds are normal. Exhibits no distension and no mass. There is no tenderness.  Musculoskeletal: Normal range of  motion. Exhibits no edema.  Lymphadenopathy:    No cervical adenopathy.  Neurological: Alert and oriented to person, place, and time. Exhibits normal muscle tone. Gait normal. Coordination normal.  Skin: Skin is warm and dry. No rash noted. Not diaphoretic. No erythema. No pallor.  Psychiatric: Mood, memory and judgment normal.  Vitals reviewed.  LABORATORY DATA: Lab Results  Component Value Date   WBC 6.9 10/23/2021   HGB 12.0 (L) 10/23/2021   HCT 34.0 (L) 10/23/2021   MCV 86.5 10/23/2021   PLT 336 10/23/2021      Chemistry      Component Value  Date/Time   NA 141 10/23/2021 1029   NA 140 11/23/2019 1516   K 3.4 (L) 10/23/2021 1029   CL 108 10/23/2021 1029   CO2 27 10/23/2021 1029   BUN 16 10/23/2021 1029   BUN 14 11/23/2019 1516   CREATININE 0.70 10/23/2021 1029      Component Value Date/Time   CALCIUM 9.0 10/23/2021 1029   ALKPHOS 77 10/23/2021 1029   AST 14 (L) 10/23/2021 1029   ALT 16 10/23/2021 1029   BILITOT 0.6 10/23/2021 1029       RADIOGRAPHIC STUDIES:  MR BRAIN W WO CONTRAST  Result Date: 10/16/2021 CLINICAL DATA:  66 year old male with a history of metastatic non-small cell lung cancer. Previous right posterior frontal craniotomy and resection. History of preop and salvage SRS in 2021. EXAM: MRI HEAD WITHOUT AND WITH CONTRAST TECHNIQUE: Multiplanar, multiecho pulse sequences of the brain and surrounding structures were obtained without and with intravenous contrast. CONTRAST:  36m MULTIHANCE GADOBENATE DIMEGLUMINE 529 MG/ML IV SOLN COMPARISON:  Brain MRI 06/18/2021 and earlier. FINDINGS: Brain: Right superior convexity craniotomy changes with underlying middle frontal gyrus small resection site. Mild T2 and FLAIR hyperintensity there, hemosiderin appears stable from last year. Minimal enhancement (series 11, image 117) appears stable, and on series 12, image 119 appears likely to be postoperative in nature. No other abnormal enhancement identified. No suspicious dural thickening. Patchy additional bilateral cerebral white matter T2 and FLAIR hyperintensity appears stable. No superimposed restricted diffusion to suggest acute infarction. No midline shift, mass effect, ventriculomegaly, extra-axial collection or acute intracranial hemorrhage. Cervicomedullary junction and pituitary are within normal limits. No other chronic cerebral blood products. Vascular: Major intracranial vascular flow voids are stable from last year. The major dural venous sinuses are enhancing on series 12 and appear to be patent. Skull and upper  cervical spine: Negative for age visible cervical spine. Right vertex craniotomy. Visualized bone marrow signal is within normal limits. Sinuses/Orbits: Orbits appear stable and negative. Left sphenoid sinus mucosal thickening is mild-to-moderate. Other sinuses remain clear. Other: Mastoids remain clear. Visible internal auditory structures appear normal. IMPRESSION: Stable and satisfactory post treatment appearance of the brain. No metastatic disease or acute intracranial abnormality identified. Electronically Signed   By: HGenevie AnnM.D.   On: 10/16/2021 07:59     ASSESSMENT/PLAN:  This is a very pleasant 66year old African-American male diagnosed with stage IV (Tx, N2, M1c) non-small cell lung cancer, adenocarcinoma.  He presented with a solitary brain metastasis in addition to right hilar and mediastinal lymphadenopathy.  He was diagnosed in August 2021.     The patient is status post right craniotomy with resection of the solitary brain metastasis with SRS under the care of Dr. MLisbeth Renshawand Dr. CChristella Noa   He completed weekly concurrent chemoradiation with carboplatin for an AUC of 2 and paclitaxel 45 mg per metered square.  He is status post 5  cycles.  He tolerated it well except for some dysphagia/odynophagia.   The patient then had evidence of new brain metastases.  He is status post SRS to the new lesions on 04/22/2020 under the care of Dr. Lisbeth Renshaw.  The patient is currently undergoing immunotherapy with Libtayo IV every 3 weeks.  He is status post 25 cycles and tolerated it well except for arthralgias.  Previously offered a break from treatment due to his arthralgias.  The patient followed up with orthopedics who found that the patient has degenerative joint disease in his knees.    Labs were reviewed. Recommend that he proceed with cycle #26 today as scheduled.   We will see the patient back for follow-up visit in 3 weeks for evaluation and repeat blood work before starting cycle #27.  I will  arrange for restaging CT scan.  We are monitoring the mild inflammatory process noted on his last scan closely to ensure it is not drug-related pneumonitis.  The patient denies any changes with his breathing today.  He will continue to follow with orthopedics for his degenerative joint disease.   The patient was advised to call immediately if he has any concerning symptoms in the interval. The patient voices understanding of current disease status and treatment options and is in agreement with the current care plan. All questions were answered. The patient knows to call the clinic with any problems, questions or concerns. We can certainly see the patient much sooner if necessary      Orders Placed This Encounter  Procedures   CT Chest W Contrast    Standing Status:   Future    Standing Expiration Date:   10/23/2022    Order Specific Question:   If indicated for the ordered procedure, I authorize the administration of contrast media per Radiology protocol    Answer:   Yes    Order Specific Question:   Preferred imaging location?    Answer:   [facility]   CT Abdomen Pelvis W Contrast    Standing Status:   Future    Standing Expiration Date:   10/23/2022    Order Specific Question:   If indicated for the ordered procedure, I authorize the administration of contrast media per Radiology protocol    Answer:   Yes    Order Specific Question:   Preferred imaging location?    Answer:   [facility]    Order Specific Question:   Is Oral Contrast requested for this exam?    Answer:   Yes, Per Radiology protocol      The total time spent in the appointment was 20-29 minutes.   Shamiyah Ngu L Breawna Montenegro, PA-C 10/23/21

## 2021-10-16 ENCOUNTER — Inpatient Hospital Stay: Payer: Medicare HMO | Attending: Internal Medicine | Admitting: Internal Medicine

## 2021-10-16 ENCOUNTER — Inpatient Hospital Stay: Payer: Medicare HMO

## 2021-10-16 ENCOUNTER — Other Ambulatory Visit: Payer: Self-pay

## 2021-10-16 VITALS — BP 141/89 | HR 79 | Temp 97.7°F | Resp 20 | Wt 190.1 lb

## 2021-10-16 DIAGNOSIS — Z79899 Other long term (current) drug therapy: Secondary | ICD-10-CM | POA: Diagnosis not present

## 2021-10-16 DIAGNOSIS — M170 Bilateral primary osteoarthritis of knee: Secondary | ICD-10-CM | POA: Diagnosis not present

## 2021-10-16 DIAGNOSIS — Z5112 Encounter for antineoplastic immunotherapy: Secondary | ICD-10-CM | POA: Insufficient documentation

## 2021-10-16 DIAGNOSIS — Z87891 Personal history of nicotine dependence: Secondary | ICD-10-CM | POA: Insufficient documentation

## 2021-10-16 DIAGNOSIS — Z923 Personal history of irradiation: Secondary | ICD-10-CM | POA: Diagnosis not present

## 2021-10-16 DIAGNOSIS — R569 Unspecified convulsions: Secondary | ICD-10-CM

## 2021-10-16 DIAGNOSIS — C3490 Malignant neoplasm of unspecified part of unspecified bronchus or lung: Secondary | ICD-10-CM | POA: Insufficient documentation

## 2021-10-16 DIAGNOSIS — C7931 Secondary malignant neoplasm of brain: Secondary | ICD-10-CM | POA: Diagnosis present

## 2021-10-16 DIAGNOSIS — Z9221 Personal history of antineoplastic chemotherapy: Secondary | ICD-10-CM | POA: Diagnosis not present

## 2021-10-16 IMAGING — CT CT ABD-PELV W/ CM
2 of 5 series · 12 of 36 positions shown, 15 images · IV contrast (OMNIPAQUE)
Comparison: CT chest dated 07/05/2020. CT abdomen/pelvis dated
02/24/2020.

CLINICAL DATA: Non-small cell lung cancer, chemotherapy ongoing,
XRT complete. Cough, intermittent shortness of breath.

EXAM:
CT CHEST, ABDOMEN, AND PELVIS WITH CONTRAST
TECHNIQUE: Multidetector CT imaging of the chest, abdomen and pelvis was
performed following the standard protocol during bolus
administration of intravenous contrast.
CONTRAST:  100mL OMNIPAQUE IOHEXOL 300 MG/ML  SOLN

[Series 2: cap with · axial · 0.86mm/px · z∈[-616,-100]mm · 9 of 129 slices shown, 12 images]
[im 13/129  mediastinal]
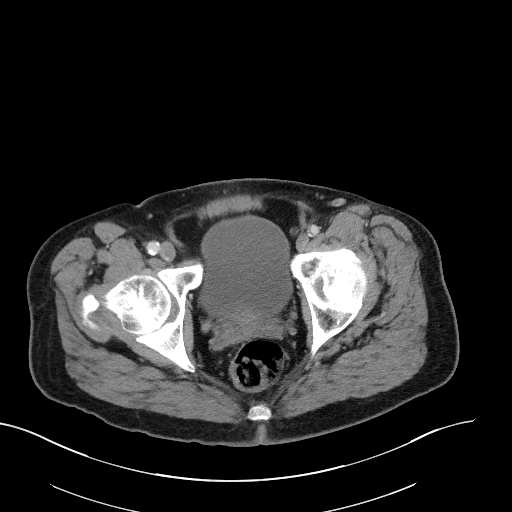
[im 13/129  lung]
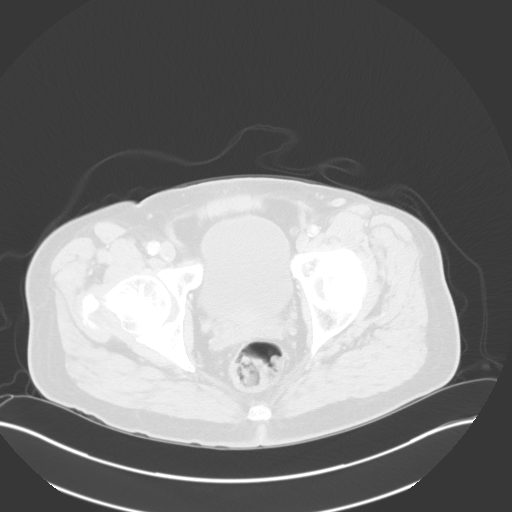
[im 26/129  lung]
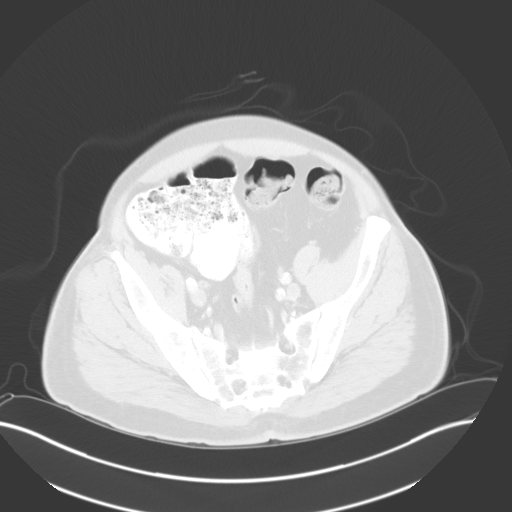
[im 39/129  lung]
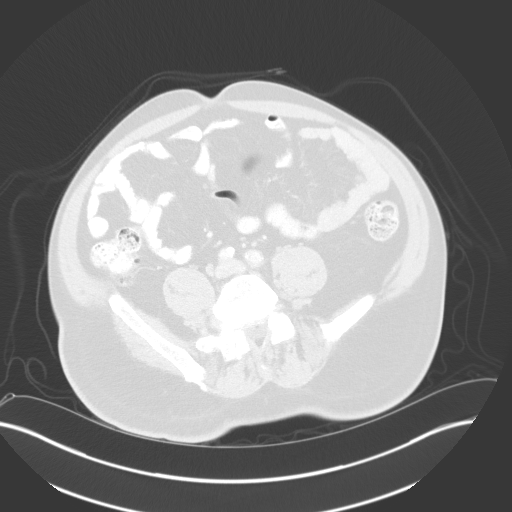
[im 52/129  lung]
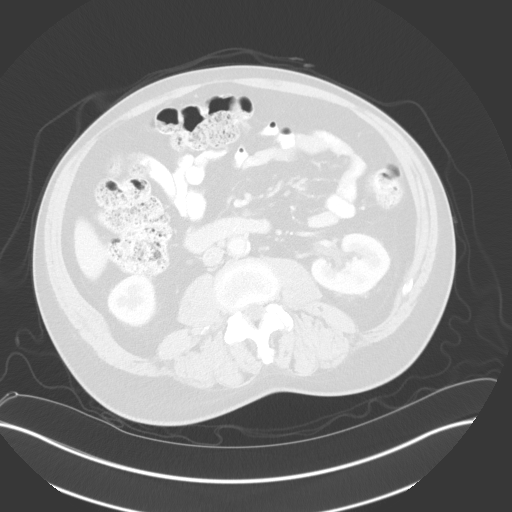
[im 65/129  mediastinal]
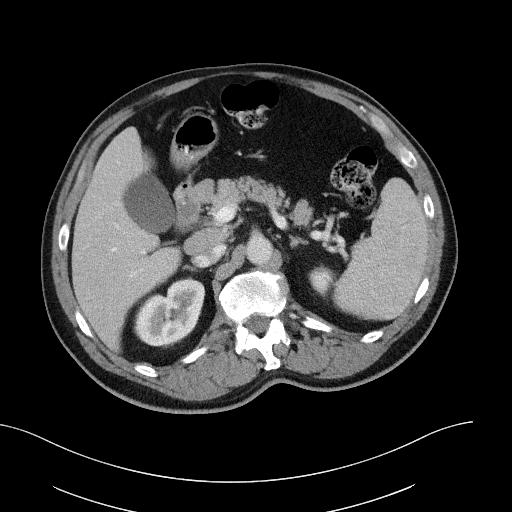
[im 65/129  lung]
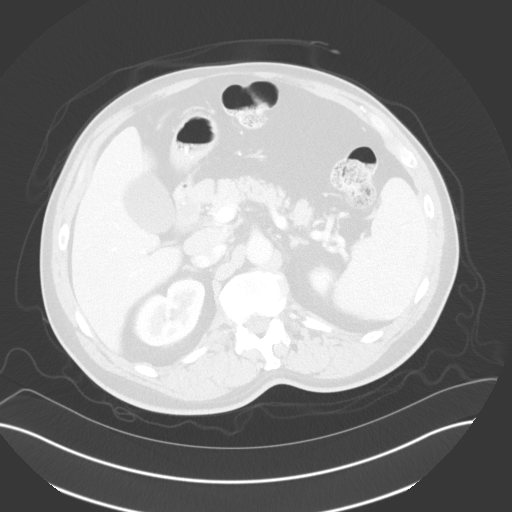
[im 77/129  lung]
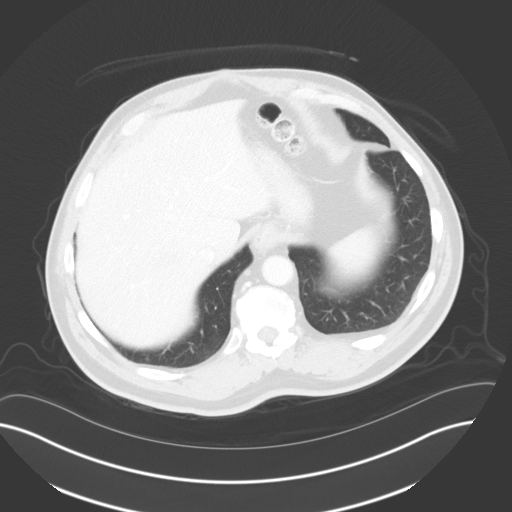
[im 90/129  lung]
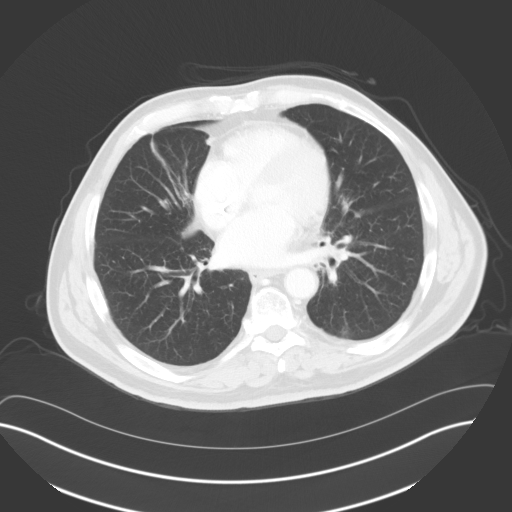
[im 103/129  lung]
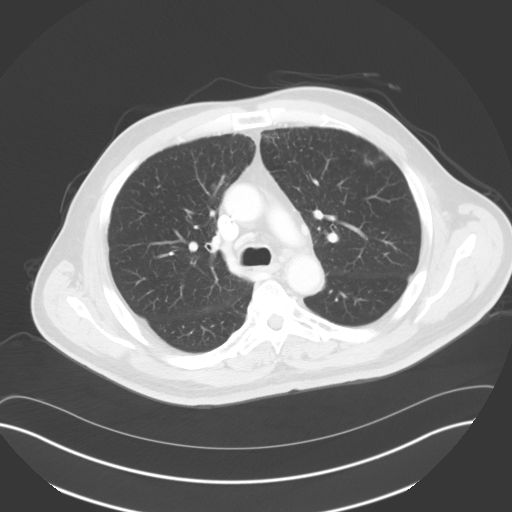
[im 116/129  mediastinal]
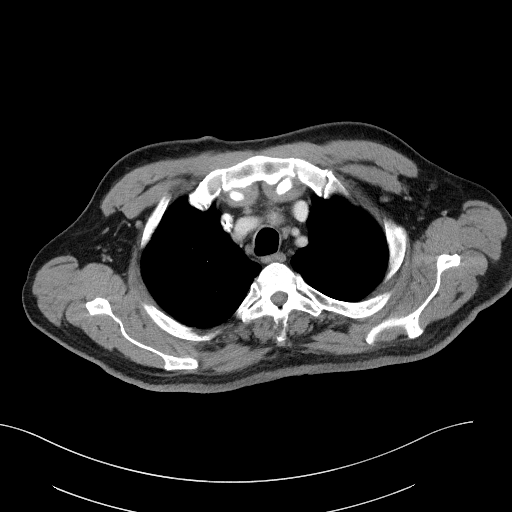
[im 116/129  lung]
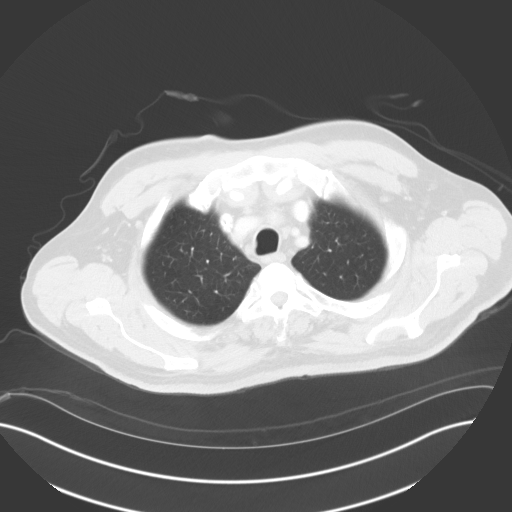

[Series 4: coronals · coronal · 0.90mm/px · 3 of 156 slices shown]
[im 32/156  lung]
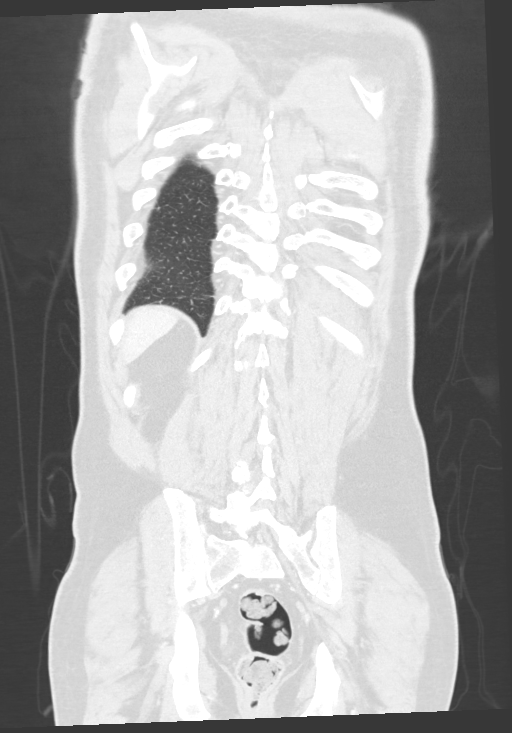
[im 63/156  lung]
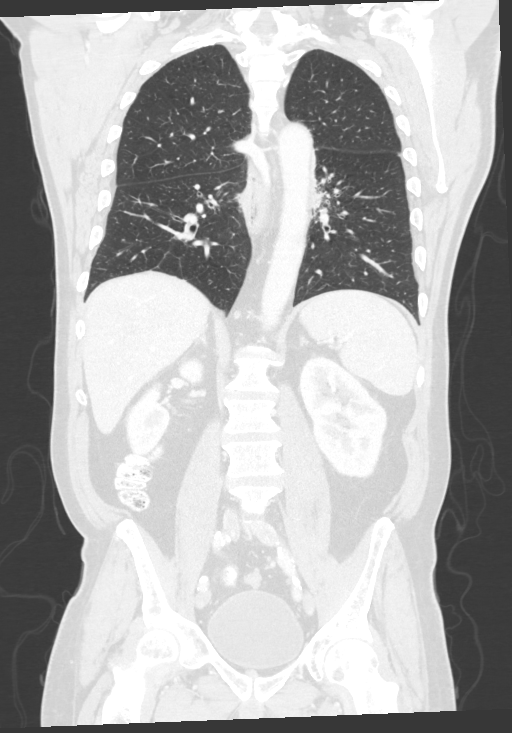
[im 94/156  lung]
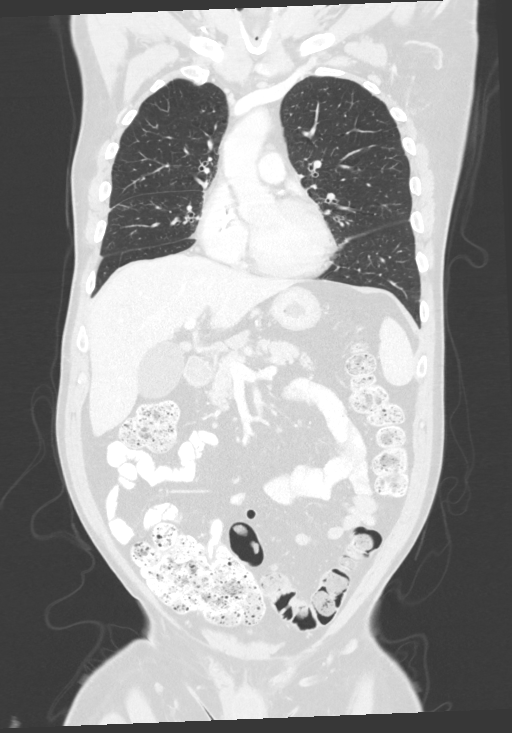

[12 of 36 positions shown; findings below may reference images not displayed]

FINDINGS: CT CHEST FINDINGS

Cardiovascular: Heart is normal in size.  No pericardial effusion.

No evidence of thoracic aortic aneurysm. Mild atherosclerotic
calcifications of the aortic arch/root.

Mild coronary atherosclerosis of the LAD and left circumflex.

Right chest port terminates at the cavoatrial junction.

Mediastinum/Nodes: Small mediastinal lymph nodes, including a
dominant 7 mm short axis low right paratracheal node (series 2/image
25) and a 6 mm short axis subcarinal node (series 2/image 33),
within normal limits.

Abnormal soft tissue in the left infrahilar region now measures 8 mm
short axis (series 2/image 37), previously 9 mm. This corresponds to
the site of the patient's known prior FDG avid tumor.

Visualized thyroid is unremarkable.

Lungs/Pleura: Mild centrilobular and paraseptal emphysematous
changes, upper lung predominant.

Radiation changes in the bilateral paramediastinal regions and
medial right middle lobe.

Prior nodular opacities in the anterior left upper lobe (series
6/image 65) and lateral left lower lobe series 6/image 105) are more
vague on the current study, possibly reflecting post
infectious/inflammatory scarring. No suspicious pulmonary nodules.

No focal consolidation.

Trace left pleural fluid (series 2/image 37).  No pneumothorax.

Musculoskeletal: No focal osseous lesions.

CT ABDOMEN PELVIS FINDINGS

Hepatobiliary: Liver is within normal limits.

Gallbladder is unremarkable. No intrahepatic or extrahepatic ductal
dilatation.

Pancreas: Within normal limits.

Spleen: Within normal limits.

Adrenals/Urinary Tract: Adrenal glands are within normal limits.

Kidneys are within normal limits.  No hydronephrosis.

Bladder is within normal limits.

Stomach/Bowel: Stomach is within normal limits.

No evidence of bowel obstruction.

Normal appendix (series 2/image 94).

Mild left colonic diverticulosis, without evidence of
diverticulitis.

Vascular/Lymphatic: No evidence of abdominal aortic aneurysm.

Atherosclerotic calcifications of the abdominal aorta and branch
vessels.

Dominant 18 mm short axis portacaval node (series 2/image 66),
unchanged. Additional upper abdominal nodes in the porta hepatis
measuring up to 17 mm short axis (series 2/image 64). Small
retroperitoneal nodes measuring up to 7 mm short axis (series
2/image 34), within normal limits.

Reproductive: Prostate is unremarkable, noting dystrophic
calcifications.

Other: Tiny fat containing right inguinal hernia. Small fat
containing left inguinal hernia (series 2/image 120).

No abdominopelvic ascites.

Musculoskeletal: Mild degenerative changes at L4-5.
IMPRESSION: Radiation changes in the lungs bilaterally.

Abnormal soft tissue in the left infrahilar region measures 8 mm,
corresponding to the patient's prior FDG avid tumor, stable versus
mildly improved.

Small upper abdominal nodes measuring up to 18 mm short axis,
similar to the prior. However, upper abdominal nodal metastases are
not excluded. Consider PET-CT for further evaluation.

## 2021-10-16 IMAGING — CT CT CHEST W/ CM
2 of 4 series · 13 of 36 positions shown, 16 images · IV contrast (OMNIPAQUE)
Comparison: CT chest dated 07/05/2020. CT abdomen/pelvis dated
02/24/2020.

CLINICAL DATA: Non-small cell lung cancer, chemotherapy ongoing,
XRT complete. Cough, intermittent shortness of breath.

EXAM:
CT CHEST, ABDOMEN, AND PELVIS WITH CONTRAST
TECHNIQUE: Multidetector CT imaging of the chest, abdomen and pelvis was
performed following the standard protocol during bolus
administration of intravenous contrast.
CONTRAST:  100mL OMNIPAQUE IOHEXOL 300 MG/ML  SOLN

[Series 4: coronals · coronal · 0.90mm/px · 3 of 156 slices shown]
[im 32/156  lung]
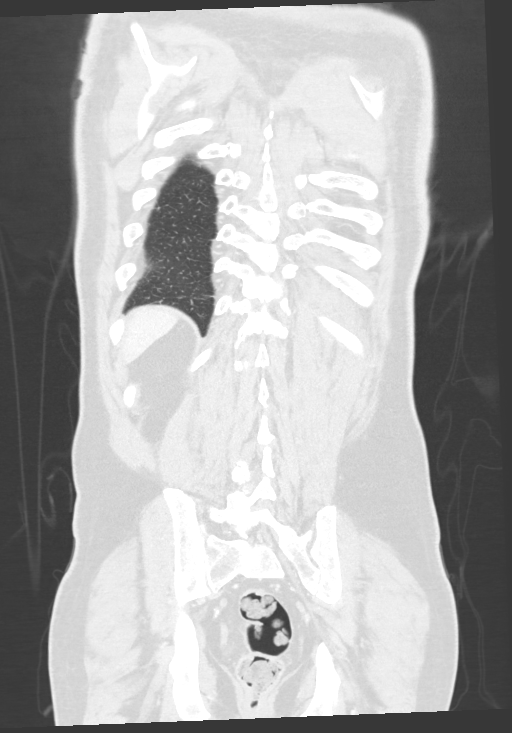
[im 63/156  lung]
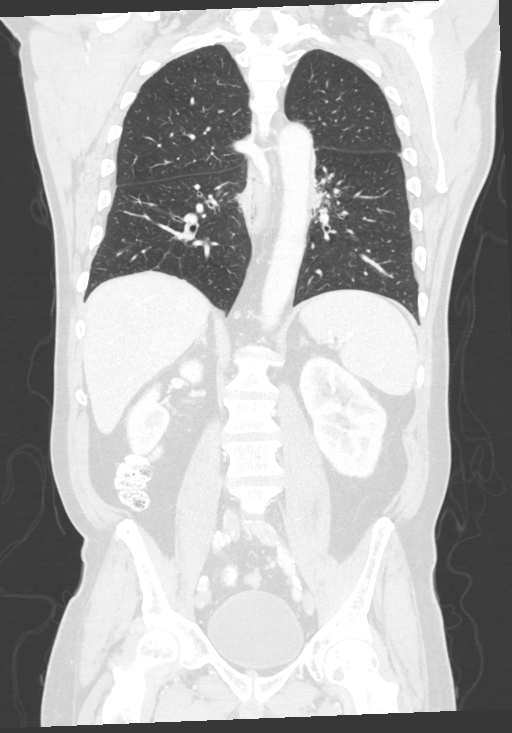
[im 94/156  lung]
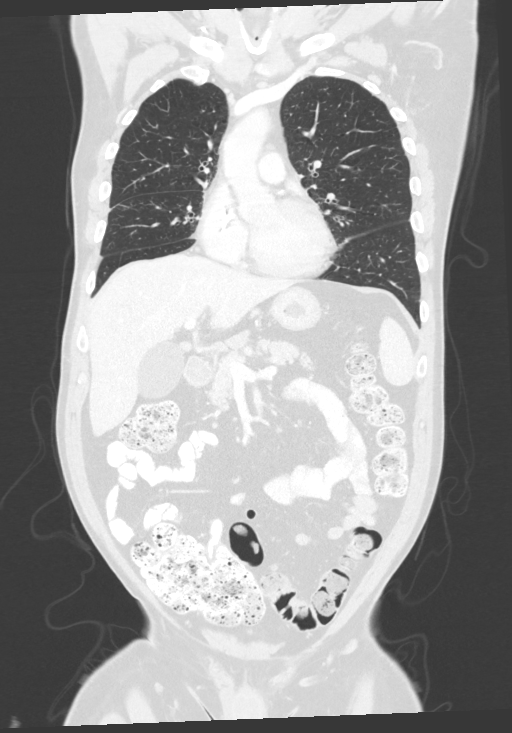

[Series 6: lung · axial · 0.86mm/px · z∈[-324,-66]mm · 10 of 159 slices shown, 13 images]
[im 15/159  mediastinal]
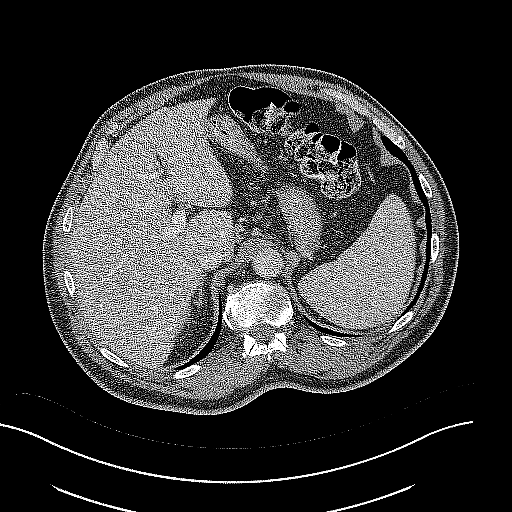
[im 15/159  lung]
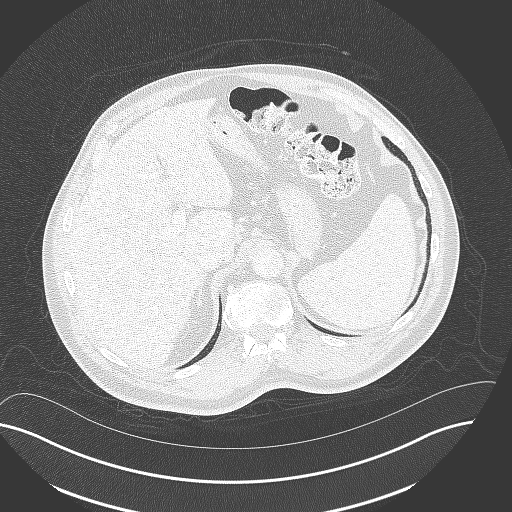
[im 29/159  lung]
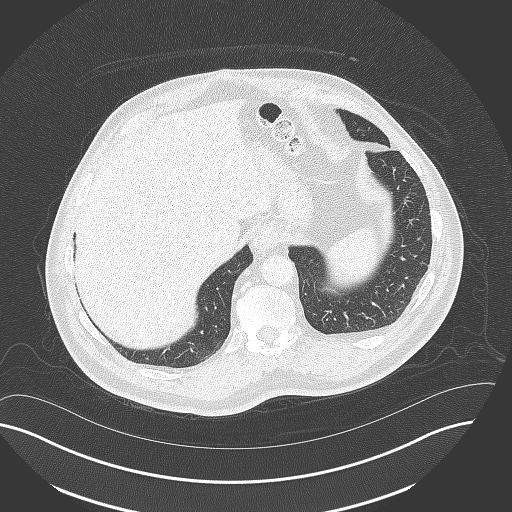
[im 44/159  lung]
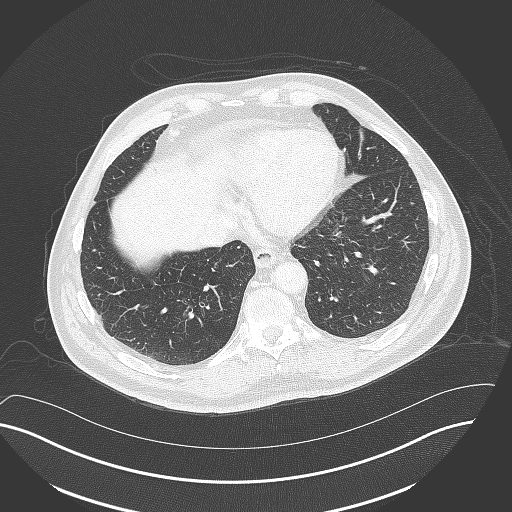
[im 58/159  lung]
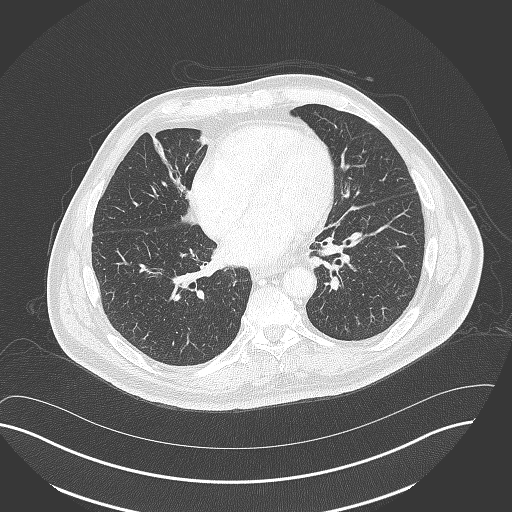
[im 72/159  mediastinal]
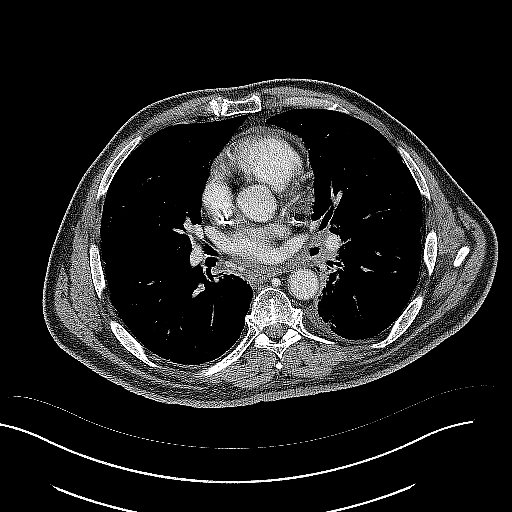
[im 72/159  lung]
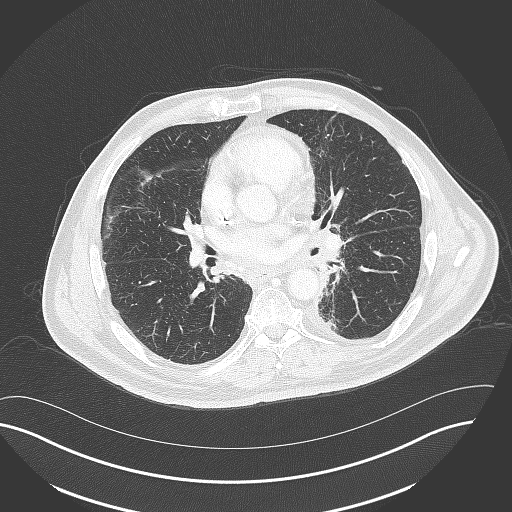
[im 87/159  lung]
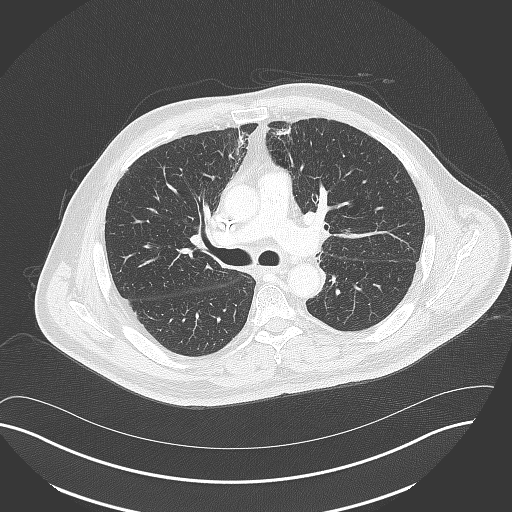
[im 101/159  lung]
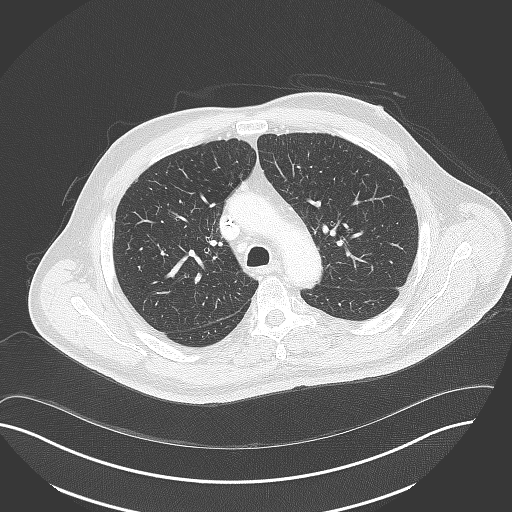
[im 115/159  lung]
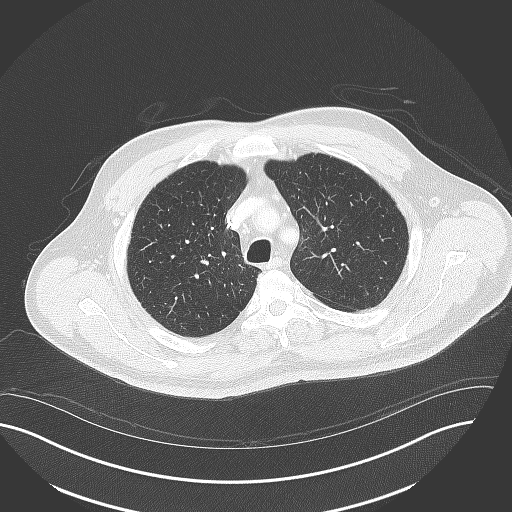
[im 130/159  mediastinal]
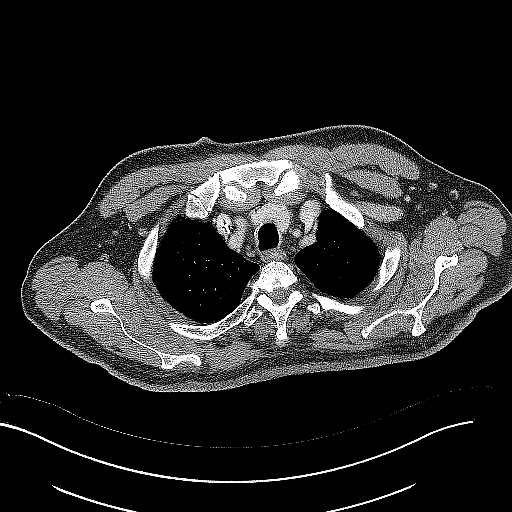
[im 130/159  lung]
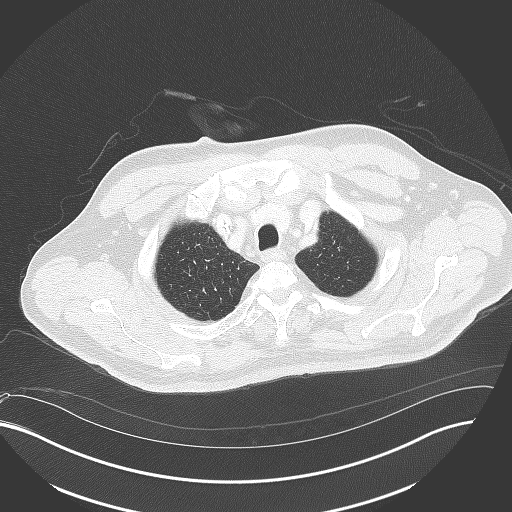
[im 144/159  lung]
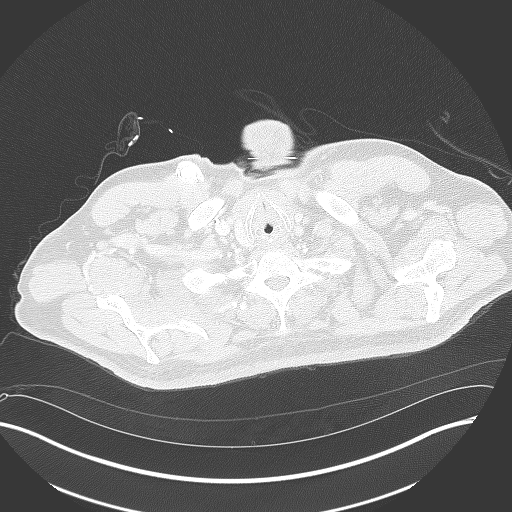

[13 of 36 positions shown; findings below may reference images not displayed]

FINDINGS: CT CHEST FINDINGS

Cardiovascular: Heart is normal in size.  No pericardial effusion.

No evidence of thoracic aortic aneurysm. Mild atherosclerotic
calcifications of the aortic arch/root.

Mild coronary atherosclerosis of the LAD and left circumflex.

Right chest port terminates at the cavoatrial junction.

Mediastinum/Nodes: Small mediastinal lymph nodes, including a
dominant 7 mm short axis low right paratracheal node (series 2/image
25) and a 6 mm short axis subcarinal node (series 2/image 33),
within normal limits.

Abnormal soft tissue in the left infrahilar region now measures 8 mm
short axis (series 2/image 37), previously 9 mm. This corresponds to
the site of the patient's known prior FDG avid tumor.

Visualized thyroid is unremarkable.

Lungs/Pleura: Mild centrilobular and paraseptal emphysematous
changes, upper lung predominant.

Radiation changes in the bilateral paramediastinal regions and
medial right middle lobe.

Prior nodular opacities in the anterior left upper lobe (series
6/image 65) and lateral left lower lobe series 6/image 105) are more
vague on the current study, possibly reflecting post
infectious/inflammatory scarring. No suspicious pulmonary nodules.

No focal consolidation.

Trace left pleural fluid (series 2/image 37).  No pneumothorax.

Musculoskeletal: No focal osseous lesions.

CT ABDOMEN PELVIS FINDINGS

Hepatobiliary: Liver is within normal limits.

Gallbladder is unremarkable. No intrahepatic or extrahepatic ductal
dilatation.

Pancreas: Within normal limits.

Spleen: Within normal limits.

Adrenals/Urinary Tract: Adrenal glands are within normal limits.

Kidneys are within normal limits.  No hydronephrosis.

Bladder is within normal limits.

Stomach/Bowel: Stomach is within normal limits.

No evidence of bowel obstruction.

Normal appendix (series 2/image 94).

Mild left colonic diverticulosis, without evidence of
diverticulitis.

Vascular/Lymphatic: No evidence of abdominal aortic aneurysm.

Atherosclerotic calcifications of the abdominal aorta and branch
vessels.

Dominant 18 mm short axis portacaval node (series 2/image 66),
unchanged. Additional upper abdominal nodes in the porta hepatis
measuring up to 17 mm short axis (series 2/image 64). Small
retroperitoneal nodes measuring up to 7 mm short axis (series
2/image 34), within normal limits.

Reproductive: Prostate is unremarkable, noting dystrophic
calcifications.

Other: Tiny fat containing right inguinal hernia. Small fat
containing left inguinal hernia (series 2/image 120).

No abdominopelvic ascites.

Musculoskeletal: Mild degenerative changes at L4-5.
IMPRESSION: Radiation changes in the lungs bilaterally.

Abnormal soft tissue in the left infrahilar region measures 8 mm,
corresponding to the patient's prior FDG avid tumor, stable versus
mildly improved.

Small upper abdominal nodes measuring up to 18 mm short axis,
similar to the prior. However, upper abdominal nodal metastases are
not excluded. Consider PET-CT for further evaluation.

## 2021-10-16 NOTE — Progress Notes (Signed)
[facility] [place], [place] [address] [phone]  Interval Evaluation  Date of Service: 10/16/21 Patient Name: Jared Shea. Patient MRN: [id number] Patient DOB: 1956/04/28 Provider: Ventura Sellers, MD  Identifying Statement:  Cree Kunert. is a 66 y.o. male with Malignant neoplasm metastatic to brain [facility]) [C79.31]   Primary Cancer:  Oncologic History: Oncology History  Non-small cell carcinoma of lung, stage 4 w/ mets to brain ([place])  12/31/2019 Initial Diagnosis   Non-small cell carcinoma of lung, stage 4 w/ mets to brain ([place])    06/06/2020 Cancer Staging   Staging form: Lung, AJCC 8th Edition - Clinical: Stage IVB (cT0, cN2, cM1c) - Signed by Curt Bears, MD on 06/06/2020    Adenocarcinoma, metastatic ([place])  01/07/2020 Initial Diagnosis   Adenocarcinoma, metastatic ([place])    02/01/2020 - 03/07/2020 Chemotherapy   The patient had dexamethasone (DECADRON) 4 MG tablet, 8 mg, Oral, Daily, 1 of 1 cycle, Start date: --, End date: -- palonosetron (ALOXI) injection 0.25 mg, 0.25 mg, Intravenous,  Once, 5 of 7 cycles Administration: 0.25 mg (02/01/2020), 0.25 mg (02/08/2020), 0.25 mg (03/07/2020), 0.25 mg (02/15/2020), 0.25 mg (02/22/2020) CARBOplatin (PARAPLATIN) 220 mg in sodium chloride 0.9 % 250 mL chemo infusion, 220 mg (100 % of original dose 216.8 mg), Intravenous,  Once, 5 of 7 cycles Dose modification: 216.8 mg (original dose 216.8 mg, Cycle 1) Administration: 220 mg (02/01/2020), 220 mg (02/08/2020), 220 mg (03/07/2020), 220 mg (02/15/2020), 220 mg (02/22/2020) PACLitaxel (TAXOL) 90 mg in sodium chloride 0.9 % 250 mL chemo infusion (</= 80mg /m2), 45 mg/m2 = 90 mg, Intravenous,  Once, 5 of 7 cycles Administration: 90 mg (02/01/2020), 90 mg (02/08/2020), 90 mg (03/07/2020), 90 mg (02/15/2020), 90 mg (02/22/2020)   for chemotherapy treatment.     04/29/2020 -  Chemotherapy   Patient is on Treatment Plan : MELANOMA  Cemiplimab q21d       CNS Oncologic History 01/05/20: Pre-op SRS [facility]) 01/07/20: Craniotomy, R frontal resection ([place]) 04/22/20: Salvage SRS to 3 targets   Interval History:  Grayling Schranz. presents today for follow up after brain MRI.  No new or progressive changes, no further seizures.  Gait is same as prior, ok once he gets going.  Currently undergoing this immunotherapy treatment with Dr. Julien Nordmann since 04/29/20.  Would like to return to work.  H+P (04/18/20) Patient presents following recent brain MRI.  He had completed pre-op SRS and underwent craniotomy for R frontal metastasis, after initial presenting with focal seizures (facial twitching).  He denies focal neurologic complaints today, he is functional and independent.  He is undergoing chemotherapy with Dr. Julien Nordmann (completed [place] and taxol) without complication.  His gait is unassisted.  Denies any seizures following his initial presentation, compliant with Keppra.      Medications: Current Outpatient Medications on File Prior to Visit  Medication Sig Dispense Refill   albuterol (VENTOLIN HFA) 108 (90 Base) MCG/ACT inhaler Inhale 2 puffs into the lungs every 6 (six) hours as needed for wheezing or shortness of breath. 8 g 2   benzonatate (TESSALON) 100 MG capsule Take 1 capsule (100 mg total) by mouth 3 (three) times daily as needed for cough. 30 capsule 2   HYDROcodone-acetaminophen (NORCO) 5-325 MG tablet Take 1 tablet by mouth every 6 (six) hours as needed for moderate pain. 30 tablet 0   ibuprofen (ADVIL) 800 MG tablet Take 800 mg by mouth 3 (three) times daily.  levETIRAcetam (KEPPRA) 750 MG tablet Take 1 tablet (750 mg total) by mouth 2 (two) times daily. 180 tablet 4   losartan (COZAAR) 25 MG tablet Take 1 tablet (25 mg total) by mouth daily. 90 tablet 3   meloxicam (MOBIC) 7.5 MG tablet Take 1 tablet (7.5 mg total) by mouth daily. 90 tablet 0   nortriptyline (PAMELOR) 25 MG capsule TAKE 2 CAPSULES(50 MG) BY MOUTH  AT BEDTIME 60 capsule 5   omeprazole (PRILOSEC) 20 MG capsule Take 20 mg by mouth daily. Pt takes in the morning     sildenafil (VIAGRA) 50 MG tablet Take 2 tablets (100 mg total) by mouth daily as needed for erectile dysfunction. 30 tablet 3   No current facility-administered medications on file prior to visit.    Allergies:  Allergies  Allergen Reactions   Penicillins Rash    Did it involve swelling of the face/tongue/throat, SOB, or low BP? N Did it involve sudden or severe rash/hives, skin peeling, or any reaction on the inside of your mouth or nose? Y Did you need to seek medical attention at a hospital or doctor's office? Y When did it last happen?    childhood   If all above answers are "NO", may proceed with cephalosporin use.    Past Medical History:  Past Medical History:  Diagnosis Date   Brain mass    Bursitis of right hip    Chest pain 08/12/2018   Chronic cough    Dependence on nicotine from cigarettes    Diabetes mellitus without complication ([place])    Essential hypertension 07/28/2018   nscl ca dx'd 12/2019   Seizures ([place])    Viral illness 03/23/2019   Past Surgical History:  Past Surgical History:  Procedure Laterality Date   APPLICATION OF CRANIAL NAVIGATION N/A 01/07/2020   Procedure: APPLICATION OF CRANIAL NAVIGATION;  Surgeon: Ashok Pall, MD;  Location: [facility];  Service: Neurosurgery;  Laterality: N/A;   CRANIOTOMY Right 01/07/2020   Procedure: RIGHT CRANIOTOMY FOR TUMOR RESECTION;  Surgeon: Ashok Pall, MD;  Location: [facility];  Service: Neurosurgery;  Laterality: Right;  rm 21   ENDOBRONCHIAL ULTRASOUND N/A 12/21/2019   Procedure: ENDOBRONCHIAL ULTRASOUND;  Surgeon: Laurin Coder, MD;  [facility];  Service: Pulmonary;  Laterality: N/A;   FINE NEEDLE ASPIRATION  12/21/2019   Procedure: FINE NEEDLE ASPIRATION (FNA) LINEAR;  Surgeon: Laurin Coder, MD;  [facility];  Service: Pulmonary;;   IR IMAGING GUIDED PORT INSERTION   03/03/2020   NO PAST SURGERIES     VIDEO BRONCHOSCOPY N/A 12/21/2019   Procedure: VIDEO BRONCHOSCOPY WITHOUT FLUORO;  Surgeon: Laurin Coder, MD;  [facility];  Service: Pulmonary;  Laterality: N/A;   Social History:  Social History   Socioeconomic History   Marital status: Divorced    Spouse name: Not on file   Number of children: Not on file   Years of education: Not on file   Highest education level: Not on file  Occupational History   Not on file  Tobacco Use   Smoking status: Former    Packs/day: 1.00    Years: 48.00    Pack years: 48.00    Types: Cigarettes    Start date: 08/07/1970    Quit date: 12/15/2019    Years since quitting: 1.8   Smokeless tobacco: Never  Vaping Use   Vaping Use: Never used  Substance and Sexual Activity   Alcohol use: Not Currently   Drug use: Never  Sexual activity: Not on file    Comment: YES  Other Topics Concern   Not on file  Social History Narrative   Not on file   Social Determinants of Health   Financial Resource Strain: Not on file  Food Insecurity: Not on file  Transportation Needs: Not on file  Physical Activity: Not on file  Stress: Not on file  Social Connections: Not on file  Intimate Partner Violence: Not on file   Family History:  Family History  Problem Relation Age of Onset   Heart disease Mother        CABG   Kidney disease Sister    Diabetes Brother    Kidney disease Brother        KIDNEY TRANSPLANT   Hypertension Sister    Healthy Daughter    Healthy Daughter    Healthy Son     Review of Systems: Constitutional: Doesn't report fevers, chills or abnormal weight loss Eyes: Doesn't report blurriness of vision Ears, nose, mouth, throat, and face: Doesn't report sore throat Respiratory: Doesn't report cough, dyspnea or wheezes Cardiovascular: Doesn't report palpitation, chest discomfort  Gastrointestinal:  Doesn't report nausea, constipation, diarrhea GU: Doesn't report  incontinence Skin: Doesn't report skin rashes Neurological: Per HPI Musculoskeletal: diffuse joint pain Behavioral/Psych: Doesn't report anxiety  Physical Exam: Wt Readings from Last 3 Encounters:  10/16/21 190 lb 1.6 oz (86.2 kg)  10/02/21 190 lb 7 oz (86.4 kg)  09/11/21 190 lb 11.2 oz (86.5 kg)   Temp Readings from Last 3 Encounters:  10/16/21 97.7 F (36.5 C) (Temporal)  10/02/21 (!) 96.4 F (35.8 C) (Tympanic)  09/11/21 98.1 F (36.7 C) (Tympanic)   BP Readings from Last 3 Encounters:  10/16/21 (!) 141/89  10/02/21 (!) 159/84  09/11/21 136/79   Pulse Readings from Last 3 Encounters:  10/16/21 79  10/02/21 78  09/11/21 94    KPS: 80. General: Alert, cooperative, pleasant, in no acute distress Head: Normal EENT: No conjunctival injection or scleral icterus.  Lungs: Resp effort normal Cardiac: Regular rate Abdomen: Non-distended abdomen Skin: No rashes cyanosis or petechiae. Extremities: No clubbing or edema  Neurologic Exam: Mental Status: Awake, alert, attentive to examiner. Oriented to self and environment. Language is fluent with intact comprehension.  Cranial Nerves: Visual acuity is grossly normal. Visual fields are full. Extra-ocular movements intact. No ptosis. Face is symmetric Motor: Tone and bulk are normal. Power is full in both arms and legs. Reflexes are symmetric, no pathologic reflexes present.  Sensory: Intact to light touch Gait: Normal.   Labs: I have reviewed the data as listed    Component Value Date/Time   NA 140 10/02/2021 1308   NA 140 11/23/2019 1516   K 3.7 10/02/2021 1308   CL 107 10/02/2021 1308   CO2 27 10/02/2021 1308   GLUCOSE 102 (H) 10/02/2021 1308   BUN 16 10/02/2021 1308   BUN 14 11/23/2019 1516   CREATININE 0.78 10/02/2021 1308   CALCIUM 9.4 10/02/2021 1308   PROT 8.1 10/02/2021 1308   ALBUMIN 4.3 10/02/2021 1308   AST 20 10/02/2021 1308   ALT 26 10/02/2021 1308   ALKPHOS 77 10/02/2021 1308   BILITOT 0.7  10/02/2021 1308   GFRNONAA >60 10/02/2021 1308   GFRAA >60 02/15/2020 1000   Lab Results  Component Value Date   WBC 6.7 10/02/2021   NEUTROABS 4.2 10/02/2021   HGB 12.6 (L) 10/02/2021   HCT 36.5 (L) 10/02/2021   MCV 87.1 10/02/2021   PLT 271  10/02/2021    Imaging:  [facility] Clinician Interpretation: I have personally reviewed the CNS images as listed.  My interpretation, in the context of the patient's clinical presentation, is stable disease  MR BRAIN W WO CONTRAST  Result Date: 10/16/2021 CLINICAL DATA:  66 year old male with a history of metastatic non-small cell lung cancer. Previous right posterior frontal craniotomy and resection. History of preop and salvage SRS in 2021. EXAM: MRI HEAD WITHOUT AND WITH CONTRAST TECHNIQUE: Multiplanar, multiecho pulse sequences of the brain and surrounding structures were obtained without and with intravenous contrast. CONTRAST:  34mL MULTIHANCE GADOBENATE DIMEGLUMINE 529 MG/ML IV SOLN COMPARISON:  Brain MRI 06/18/2021 and earlier. FINDINGS: Brain: Right superior convexity craniotomy changes with underlying middle frontal gyrus small resection site. Mild T2 and FLAIR hyperintensity there, hemosiderin appears stable from last year. Minimal enhancement (series 11, image 117) appears stable, and on series 12, image 119 appears likely to be postoperative in nature. No other abnormal enhancement identified. No suspicious dural thickening. Patchy additional bilateral cerebral white matter T2 and FLAIR hyperintensity appears stable. No superimposed restricted diffusion to suggest acute infarction. No midline shift, mass effect, ventriculomegaly, extra-axial collection or acute intracranial hemorrhage. Cervicomedullary junction and pituitary are within normal limits. No other chronic cerebral blood products. Vascular: Major intracranial vascular flow voids are stable from last year. The major dural venous sinuses are enhancing on series 12 and appear to be patent.  Skull and upper cervical spine: Negative for age visible cervical spine. Right vertex craniotomy. Visualized bone marrow signal is within normal limits. Sinuses/Orbits: Orbits appear stable and negative. Left sphenoid sinus mucosal thickening is mild-to-moderate. Other sinuses remain clear. Other: Mastoids remain clear. Visible internal auditory structures appear normal. IMPRESSION: Stable and satisfactory post treatment appearance of the brain. No metastatic disease or acute intracranial abnormality identified. Electronically Signed   By: Genevie Ann M.D.   On: 10/16/2021 07:59     Assessment/Plan Malignant neoplasm metastatic to brain [facility]) [C79.31]  [name]. is clinically and radiographically stable today.  No clinical changes today.  Can con't Pamelor for headaches, Keppra for seizure prevention.  Continues on Libtayo immunotherapy with Dr. Julien Nordmann.  We appreciate the opportunity to participate in the care of Joshoa Shawler..    We ask that [name]. return to clinic in 6 months following next brain MRI, or sooner as needed.  All questions were answered. The patient knows to call the clinic with any problems, questions or concerns. No barriers to learning were detected.  The total time spent in the encounter was 30 minutes and more than 50% was on counseling and review of test results   Ventura Sellers, MD Medical Director of Neuro-Oncology [facility] at [place] 10/16/21 10:24 AM

## 2021-10-18 ENCOUNTER — Encounter: Payer: Self-pay | Admitting: Orthopaedic Surgery

## 2021-10-18 ENCOUNTER — Ambulatory Visit (INDEPENDENT_AMBULATORY_CARE_PROVIDER_SITE_OTHER): Payer: Medicare HMO | Admitting: Orthopaedic Surgery

## 2021-10-18 DIAGNOSIS — M170 Bilateral primary osteoarthritis of knee: Secondary | ICD-10-CM

## 2021-10-18 MED ORDER — LIDOCAINE HCL 1 % IJ SOLN
3.0000 mL | INTRAMUSCULAR | Status: AC | PRN
  Administered 2021-10-18: 3 mL

## 2021-10-18 MED ORDER — HYALURONAN 88 MG/4ML IX SOSY
88.0000 mg | PREFILLED_SYRINGE | INTRA_ARTICULAR | Status: AC | PRN
  Administered 2021-10-18: 88 mg via INTRA_ARTICULAR

## 2021-10-18 MED ORDER — BUPIVACAINE HCL 0.25 % IJ SOLN
0.6600 mL | INTRAMUSCULAR | Status: AC | PRN
  Administered 2021-10-18: .66 mL via INTRA_ARTICULAR

## 2021-10-18 NOTE — Progress Notes (Signed)
Office Visit Note   Patient: Jared Shea.           Date of Birth: May 23, 1955           MRN: [id number] Visit Date: 10/18/2021              Requested by: [name], MD [address] [place],  [place] [address] PCP: [name], MD   Assessment & Plan: Visit Diagnoses:  1. Bilateral primary osteoarthritis of knee     Plan: Impression is bilateral knee osteoarthritis.  Today, we proceeded with bilateral knee Monovisc injections.  He tolerated these well.  He will follow-up with [place] as needed.  Follow-Up Instructions: Return if symptoms worsen or fail to improve.   Orders:  Orders Placed This Encounter  Procedures   Large Joint Inj: bilateral knee   No orders of the defined types were placed in this encounter.     Procedures: Large Joint Inj: bilateral knee on 10/18/2021 9:39 AM Indications: pain Details: 22 G needle, anterolateral approach Medications (Right): 0.66 mL bupivacaine 0.25 %; 3 mL lidocaine 1 %; 88 mg Hyaluronan 88 MG/4ML Medications (Left): 0.66 mL bupivacaine 0.25 %; 3 mL lidocaine 1 %; 88 mg Hyaluronan 88 MG/4ML     Clinical Data: No additional findings.   Subjective: Chief Complaint  Patient presents with   Left Knee - Follow-up    Monovisc   Right Knee - Follow-up    Monovisc    HPI patient is a pleasant 66 year old gentleman with underlying bilateral knee osteoarthritis who comes in today for bilateral knee Monovisc injections.  He has had cortisone injections in the past which provided a few months relief each.  No previous viscosupplementation injection.     Objective: Vital Signs: There were no vitals taken for this visit.    Ortho Exam bilateral knee exam is unchanged  Specialty Comments:  No specialty comments available.  Imaging: No new imaging    PMFS History: Patient Active Problem List   Diagnosis Date Noted   Chills 09/08/2021   Primary osteoarthritis of right knee 08/15/2021   Primary osteoarthritis of  left knee 08/15/2021   Erectile dysfunction 07/11/2021   Knee pain 07/11/2021   Dysuria 04/03/2021   Vision changes 03/14/2021   Memory loss 03/14/2021   Port-A-Cath in place 07/05/2020   Frequent headaches 06/27/2020   Polyarthralgia 06/15/2020   Headache 05/09/2020   Encounter for antineoplastic immunotherapy 04/18/2020   Goals of care, counseling/discussion 04/18/2020   Adenocarcinoma, metastatic ([place]) 01/07/2020   Adjustment disorder with depressed mood 01/06/2020   Thyroid nodule 01/04/2020   Non-small cell carcinoma of lung, stage 4 w/ mets to brain ([place]) 12/31/2019   Partial symptomatic epilepsy with complex partial seizures, not intractable, without status epilepticus ([place]) 12/23/2019   Malignant neoplasm metastatic to brain ([place]) 12/23/2019   Focal seizures ([place]) 12/09/2019   Healthcare maintenance 12/24/2018   Essential hypertension 07/28/2018   Past Medical History:  Diagnosis Date   Brain mass    Bursitis of right hip    Chest pain 08/12/2018   Chronic cough    Dependence on nicotine from cigarettes    Diabetes mellitus without complication ([place])    Essential hypertension 07/28/2018   nscl ca dx'd 12/2019   Seizures ([place])    Viral illness 03/23/2019    Family History  Problem Relation Age of Onset   Heart disease Mother        CABG   Kidney disease Sister    Diabetes  Brother    Kidney disease Brother        KIDNEY TRANSPLANT   Hypertension Sister    Healthy Daughter    Healthy Daughter    Healthy Son     Past Surgical History:  Procedure Laterality Date   APPLICATION OF CRANIAL NAVIGATION N/A 01/07/2020   Procedure: APPLICATION OF CRANIAL NAVIGATION;  Surgeon: Ashok Pall, MD;  Location: [facility];  Service: Neurosurgery;  Laterality: N/A;   CRANIOTOMY Right 01/07/2020   Procedure: RIGHT CRANIOTOMY FOR TUMOR RESECTION;  Surgeon: Ashok Pall, MD;  Location: [facility];  Service: Neurosurgery;  Laterality: Right;  rm 21   ENDOBRONCHIAL ULTRASOUND N/A 12/21/2019    Procedure: ENDOBRONCHIAL ULTRASOUND;  Surgeon: Laurin Coder, MD;  [facility];  Service: Pulmonary;  Laterality: N/A;   FINE NEEDLE ASPIRATION  12/21/2019   Procedure: FINE NEEDLE ASPIRATION (FNA) LINEAR;  Surgeon: Laurin Coder, MD;  [facility];  Service: Pulmonary;;   IR IMAGING GUIDED PORT INSERTION  03/03/2020   NO PAST SURGERIES     VIDEO BRONCHOSCOPY N/A 12/21/2019   Procedure: VIDEO BRONCHOSCOPY WITHOUT FLUORO;  Surgeon: Laurin Coder, MD;  [facility];  Service: Pulmonary;  Laterality: N/A;   Social History   Occupational History   Not on file  Tobacco Use   Smoking status: Former    Packs/day: 1.00    Years: 48.00    Pack years: 48.00    Types: Cigarettes    Start date: 08/07/1970    Quit date: 12/15/2019    Years since quitting: 1.8   Smokeless tobacco: Never  Vaping Use   Vaping Use: Never used  Substance and Sexual Activity   Alcohol use: Not Currently   Drug use: Never   Sexual activity: Not on file    Comment: YES

## 2021-10-23 ENCOUNTER — Other Ambulatory Visit: Payer: Self-pay

## 2021-10-23 ENCOUNTER — Inpatient Hospital Stay: Payer: Medicare HMO

## 2021-10-23 ENCOUNTER — Inpatient Hospital Stay (HOSPITAL_BASED_OUTPATIENT_CLINIC_OR_DEPARTMENT_OTHER): Payer: Medicare HMO | Admitting: Physician Assistant

## 2021-10-23 VITALS — BP 159/83 | HR 83 | Temp 97.5°F | Resp 18 | Ht 73.0 in | Wt 191.9 lb

## 2021-10-23 DIAGNOSIS — C3490 Malignant neoplasm of unspecified part of unspecified bronchus or lung: Secondary | ICD-10-CM

## 2021-10-23 DIAGNOSIS — Z95828 Presence of other vascular implants and grafts: Secondary | ICD-10-CM

## 2021-10-23 DIAGNOSIS — Z5112 Encounter for antineoplastic immunotherapy: Secondary | ICD-10-CM | POA: Diagnosis not present

## 2021-10-23 DIAGNOSIS — C799 Secondary malignant neoplasm of unspecified site: Secondary | ICD-10-CM

## 2021-10-23 LAB — TSH: TSH: 0.94 u[IU]/mL (ref 0.350–4.500)

## 2021-10-23 LAB — CBC WITH DIFFERENTIAL (CANCER CENTER ONLY)
Abs Immature Granulocytes: 0.03 10*3/uL (ref 0.00–0.07)
Basophils Absolute: 0 10*3/uL (ref 0.0–0.1)
Basophils Relative: 0 %
Eosinophils Absolute: 0.1 10*3/uL (ref 0.0–0.5)
Eosinophils Relative: 1 %
HCT: 34 % — ABNORMAL LOW (ref 39.0–52.0)
Hemoglobin: 12 g/dL — ABNORMAL LOW (ref 13.0–17.0)
Immature Granulocytes: 0 %
Lymphocytes Relative: 16 %
Lymphs Abs: 1.1 10*3/uL (ref 0.7–4.0)
MCH: 30.5 pg (ref 26.0–34.0)
MCHC: 35.3 g/dL (ref 30.0–36.0)
MCV: 86.5 fL (ref 80.0–100.0)
Monocytes Absolute: 0.6 10*3/uL (ref 0.1–1.0)
Monocytes Relative: 8 %
Neutro Abs: 5.1 10*3/uL (ref 1.7–7.7)
Neutrophils Relative %: 75 %
Platelet Count: 336 10*3/uL (ref 150–400)
RBC: 3.93 MIL/uL — ABNORMAL LOW (ref 4.22–5.81)
RDW: 15.6 % — ABNORMAL HIGH (ref 11.5–15.5)
WBC Count: 6.9 10*3/uL (ref 4.0–10.5)
nRBC: 0 % (ref 0.0–0.2)

## 2021-10-23 LAB — CMP (CANCER CENTER ONLY)
ALT: 16 U/L (ref 0–44)
AST: 14 U/L — ABNORMAL LOW (ref 15–41)
Albumin: 4.2 g/dL (ref 3.5–5.0)
Alkaline Phosphatase: 77 U/L (ref 38–126)
Anion gap: 6 (ref 5–15)
BUN: 16 mg/dL (ref 8–23)
CO2: 27 mmol/L (ref 22–32)
Calcium: 9 mg/dL (ref 8.9–10.3)
Chloride: 108 mmol/L (ref 98–111)
Creatinine: 0.7 mg/dL (ref 0.61–1.24)
GFR, Estimated: >60 mL/min (ref >60)
Glucose, Bld: 138 mg/dL — ABNORMAL HIGH (ref 70–99)
Potassium: 3.4 mmol/L — ABNORMAL LOW (ref 3.5–5.1)
Sodium: 141 mmol/L (ref 135–145)
Total Bilirubin: 0.6 mg/dL (ref 0.3–1.2)
Total Protein: 7.4 g/dL (ref 6.5–8.1)

## 2021-10-23 MED ORDER — SODIUM CHLORIDE 0.9 % IV SOLN: Freq: Once | INTRAVENOUS | Status: AC

## 2021-10-23 MED ORDER — HEPARIN SOD (PORK) LOCK FLUSH 100 UNIT/ML IV SOLN
500.0000 [IU] | Freq: Once | INTRAVENOUS | Status: AC | PRN
  Administered 2021-10-23: 500 [IU]

## 2021-10-23 MED ORDER — SODIUM CHLORIDE 0.9% FLUSH
10.0000 mL | Freq: Once | INTRAVENOUS | Status: AC
  Administered 2021-10-23: 10 mL

## 2021-10-23 MED ORDER — SODIUM CHLORIDE 0.9% FLUSH
10.0000 mL | INTRAVENOUS | Status: DC | PRN
  Administered 2021-10-23: 10 mL

## 2021-10-23 MED ORDER — SODIUM CHLORIDE 0.9 % IV SOLN
350.0000 mg | Freq: Once | INTRAVENOUS | Status: AC
  Administered 2021-10-23: 350 mg via INTRAVENOUS
  Filled 2021-10-23: qty 7

## 2021-10-23 NOTE — Patient Instructions (Signed)
[facility]  Discharge Instructions: Thank you for choosing [facility] to provide your oncology and hematology care.   If you have a lab appointment with the [facility], please go directly to the [facility] and check in at the registration area.   Wear comfortable clothing and clothing appropriate for easy access to any Portacath or PICC line.   We strive to give you quality time with your provider. You may need to reschedule your appointment if you arrive late (15 or more minutes).  Arriving late affects you and other patients whose appointments are after yours.  Also, if you miss three or more appointments without notifying the office, you may be dismissed from the clinic at the provider's discretion.      For prescription refill requests, have your pharmacy contact our office and allow 72 hours for refills to be completed.    Today you received the following chemotherapy and/or immunotherapy agents: Libtayo      To help prevent nausea and vomiting after your treatment, we encourage you to take your nausea medication as directed.  BELOW ARE SYMPTOMS THAT SHOULD BE REPORTED IMMEDIATELY: *FEVER GREATER THAN 100.4 F (38 C) OR HIGHER *CHILLS OR SWEATING *NAUSEA AND VOMITING THAT IS NOT CONTROLLED WITH YOUR NAUSEA MEDICATION *UNUSUAL SHORTNESS OF BREATH *UNUSUAL BRUISING OR BLEEDING *URINARY PROBLEMS (pain or burning when urinating, or frequent urination) *BOWEL PROBLEMS (unusual diarrhea, constipation, pain near the anus) TENDERNESS IN MOUTH AND THROAT WITH OR WITHOUT PRESENCE OF ULCERS (sore throat, sores in mouth, or a toothache) UNUSUAL RASH, SWELLING OR PAIN  UNUSUAL VAGINAL DISCHARGE OR ITCHING   Items with * indicate a potential emergency and should be followed up as soon as possible or go to the Emergency Department if any problems should occur.  Please show the CHEMOTHERAPY ALERT CARD or IMMUNOTHERAPY ALERT CARD at check-in to  the Emergency Department and triage nurse.  Should you have questions after your visit or need to cancel or reschedule your appointment, please contact [facility]  Dept: [phone]  and follow the prompts.  Office hours are 8:00 a.m. to 4:30 p.m. Monday - Friday. Please note that voicemails left after 4:00 p.m. may not be returned until the following business day.  We are closed weekends and major holidays. You have access to a nurse at all times for urgent questions. Please call the main number to the clinic Dept: [phone] and follow the prompts.   For any non-urgent questions, you may also contact your provider using MyChart. We now offer e-Visits for anyone 38 and older to request care online for non-urgent symptoms. For details visit [email or web address].   Also download the MyChart app! Go to the app store, search "MyChart", open the app, select [facility], and log in with your MyChart username and password.  Due to Covid, a mask is required upon entering the hospital/clinic. If you do not have a mask, one will be given to you upon arrival. For doctor visits, patients may have 1 support person aged 9 or older with them. For treatment visits, patients cannot have anyone with them due to current Covid guidelines and our immunocompromised population.

## 2021-11-04 ENCOUNTER — Encounter: Payer: Self-pay | Admitting: *Deleted

## 2021-11-06 ENCOUNTER — Ambulatory Visit (HOSPITAL_COMMUNITY): Admission: RE | Admit: 2021-11-06 | Payer: Medicare HMO | Source: Ambulatory Visit

## 2021-11-07 ENCOUNTER — Ambulatory Visit (HOSPITAL_COMMUNITY)
Admission: RE | Admit: 2021-11-07 | Discharge: 2021-11-07 | Disposition: A | Discharge status: Home / Self Care | Payer: Medicare HMO | Source: Ambulatory Visit | Attending: Physician Assistant | Admitting: Physician Assistant

## 2021-11-07 DIAGNOSIS — C3490 Malignant neoplasm of unspecified part of unspecified bronchus or lung: Secondary | ICD-10-CM | POA: Diagnosis present

## 2021-11-07 MED ORDER — HEPARIN SOD (PORK) LOCK FLUSH 100 UNIT/ML IV SOLN
INTRAVENOUS | Status: AC
  Filled 2021-11-07: qty 5

## 2021-11-07 MED ORDER — HEPARIN SOD (PORK) LOCK FLUSH 100 UNIT/ML IV SOLN
500.0000 [IU] | Freq: Once | INTRAVENOUS | Status: AC
  Administered 2021-11-07: 500 [IU] via INTRAVENOUS

## 2021-11-07 MED ORDER — IOHEXOL 300 MG/ML  SOLN
100.0000 mL | Freq: Once | INTRAMUSCULAR | Status: AC | PRN
  Administered 2021-11-07: 100 mL via INTRAVENOUS

## 2021-11-13 ENCOUNTER — Inpatient Hospital Stay: Payer: Medicare HMO

## 2021-11-13 ENCOUNTER — Inpatient Hospital Stay: Payer: Medicare HMO | Attending: Internal Medicine | Admitting: Internal Medicine

## 2021-11-13 ENCOUNTER — Other Ambulatory Visit: Payer: Self-pay

## 2021-11-13 ENCOUNTER — Other Ambulatory Visit: Payer: Medicare HMO

## 2021-11-13 VITALS — BP 159/85 | HR 80 | Temp 98.5°F | Resp 18 | Ht 73.0 in | Wt 189.1 lb

## 2021-11-13 DIAGNOSIS — C3490 Malignant neoplasm of unspecified part of unspecified bronchus or lung: Secondary | ICD-10-CM

## 2021-11-13 DIAGNOSIS — Z95828 Presence of other vascular implants and grafts: Secondary | ICD-10-CM

## 2021-11-13 DIAGNOSIS — Z79899 Other long term (current) drug therapy: Secondary | ICD-10-CM | POA: Diagnosis not present

## 2021-11-13 DIAGNOSIS — Z923 Personal history of irradiation: Secondary | ICD-10-CM | POA: Diagnosis not present

## 2021-11-13 DIAGNOSIS — C7931 Secondary malignant neoplasm of brain: Secondary | ICD-10-CM | POA: Insufficient documentation

## 2021-11-13 DIAGNOSIS — C799 Secondary malignant neoplasm of unspecified site: Secondary | ICD-10-CM

## 2021-11-13 DIAGNOSIS — Z87891 Personal history of nicotine dependence: Secondary | ICD-10-CM | POA: Insufficient documentation

## 2021-11-13 DIAGNOSIS — Z5112 Encounter for antineoplastic immunotherapy: Secondary | ICD-10-CM | POA: Insufficient documentation

## 2021-11-13 LAB — CBC WITH DIFFERENTIAL (CANCER CENTER ONLY)
Abs Immature Granulocytes: 0.02 10*3/uL (ref 0.00–0.07)
Basophils Absolute: 0 10*3/uL (ref 0.0–0.1)
Basophils Relative: 1 %
Eosinophils Absolute: 0.1 10*3/uL (ref 0.0–0.5)
Eosinophils Relative: 1 %
HCT: 35.3 % — ABNORMAL LOW (ref 39.0–52.0)
Hemoglobin: 12.3 g/dL — ABNORMAL LOW (ref 13.0–17.0)
Immature Granulocytes: 0 %
Lymphocytes Relative: 21 %
Lymphs Abs: 1.3 10*3/uL (ref 0.7–4.0)
MCH: 30 pg (ref 26.0–34.0)
MCHC: 34.8 g/dL (ref 30.0–36.0)
MCV: 86.1 fL (ref 80.0–100.0)
Monocytes Absolute: 0.4 10*3/uL (ref 0.1–1.0)
Monocytes Relative: 7 %
Neutro Abs: 4.3 10*3/uL (ref 1.7–7.7)
Neutrophils Relative %: 70 %
Platelet Count: 336 10*3/uL (ref 150–400)
RBC: 4.1 MIL/uL — ABNORMAL LOW (ref 4.22–5.81)
RDW: 14.9 % (ref 11.5–15.5)
WBC Count: 6.2 10*3/uL (ref 4.0–10.5)
nRBC: 0 % (ref 0.0–0.2)

## 2021-11-13 LAB — CMP (CANCER CENTER ONLY)
ALT: 25 U/L (ref 0–44)
AST: 22 U/L (ref 15–41)
Albumin: 4.3 g/dL (ref 3.5–5.0)
Alkaline Phosphatase: 81 U/L (ref 38–126)
Anion gap: 6 (ref 5–15)
BUN: 16 mg/dL (ref 8–23)
CO2: 26 mmol/L (ref 22–32)
Calcium: 9.1 mg/dL (ref 8.9–10.3)
Chloride: 109 mmol/L (ref 98–111)
Creatinine: 0.7 mg/dL (ref 0.61–1.24)
GFR, Estimated: >60 mL/min (ref >60)
Glucose, Bld: 128 mg/dL — ABNORMAL HIGH (ref 70–99)
Potassium: 3.3 mmol/L — ABNORMAL LOW (ref 3.5–5.1)
Sodium: 141 mmol/L (ref 135–145)
Total Bilirubin: 0.7 mg/dL (ref 0.3–1.2)
Total Protein: 7.7 g/dL (ref 6.5–8.1)

## 2021-11-13 LAB — TSH: TSH: 1.434 u[IU]/mL (ref 0.350–4.500)

## 2021-11-13 MED ORDER — SODIUM CHLORIDE 0.9% FLUSH
10.0000 mL | Freq: Once | INTRAVENOUS | Status: AC
  Administered 2021-11-13: 10 mL

## 2021-11-13 MED ORDER — SODIUM CHLORIDE 0.9 % IV SOLN
350.0000 mg | Freq: Once | INTRAVENOUS | Status: AC
  Administered 2021-11-13: 350 mg via INTRAVENOUS
  Filled 2021-11-13: qty 7

## 2021-11-13 MED ORDER — HEPARIN SOD (PORK) LOCK FLUSH 100 UNIT/ML IV SOLN
500.0000 [IU] | Freq: Once | INTRAVENOUS | Status: AC | PRN
  Administered 2021-11-13: 500 [IU]

## 2021-11-13 MED ORDER — SODIUM CHLORIDE 0.9 % IV SOLN: Freq: Once | INTRAVENOUS | Status: AC

## 2021-11-13 MED ORDER — SODIUM CHLORIDE 0.9% FLUSH
10.0000 mL | INTRAVENOUS | Status: DC | PRN
  Administered 2021-11-13: 10 mL

## 2021-11-13 NOTE — Patient Instructions (Signed)
Cemiplimab Injection What is this medication? CEMIPLIMAB (se MIP li mab) treats skin cancer and lung cancer. It works by blocking a protein that causes cancer cells to grow and multiply. This helps to slow or stop the spread of cancer cells. It is a monoclonal antibody. This medicine may be used for other purposes; ask your health care provider or pharmacist if you have questions. COMMON BRAND NAME(S): LIBTAYO What should I tell my care team before I take this medication? They need to know if you have any of these conditions: Allogeneic stem cell transplant (uses someone else's stem cells) Autoimmune diseases, such as Crohn's disease, ulcerative colitis, or lupus Diabetes Nervous system problems, such as myasthenia gravis or Guillain-Barre syndrome Organ transplant Recent or ongoing radiation Thyroid disease An unusual or allergic reaction to cemiplimab, other medications, foods, dyes, or preservatives Pregnant or trying to get pregnant Breast-feeding How should I use this medication? This medication is injected into a vein. It is given by your care team in a hospital or clinic setting. A special MedGuide will be given to you before each treatment. Be sure to read this information carefully each time. Talk to your care team about the use of this medication in children. Special care may be needed. Overdosage: If you think you have taken too much of this medicine contact a poison control center or emergency room at once. NOTE: This medicine is only for you. Do not share this medicine with others. What if I miss a dose? Keep appointments for follow-up doses. It is important not to miss your dose. Call your care team if you are unable to keep an appointment. What may interact with this medication? Interactions have not been studied. This list may not describe all possible interactions. Give your health care provider a list of all the medicines, herbs, non-prescription drugs, or dietary  supplements you use. Also tell them if you smoke, drink alcohol, or use illegal drugs. Some items may interact with your medicine. What should I watch for while using this medication? This medication may make you feel generally unwell. This is not uncommon as chemotherapy can affect healthy cells as well as cancer cells. Report any side effects. Continue your course of treatment even though you feel ill until your care team tells you to stop. You may need blood work done while you are taking this medication. This medication may cause serious skin reactions. They can happen in the weeks to months after starting the medication. Contact your care team right away if you notice fevers or flu-like symptoms with a rash. The rash may be red or purple and then turn into blisters or peeling of the skin. You may also notice a red rash with swelling of the face, lips, or lymph nodes in your neck or under your arms. Tell your care team right away if you have any changes in your vision. This medication may increase blood sugar. The risk may be higher in patients who already have diabetes. Ask your care team what you can do to lower your risk of diabetes while taking this medication. Talk to your care team if you wish to become pregnant or think you might be pregnant. This medication can cause serious birth defects if taken during pregnancy. A negative pregnancy test is required before starting this medication. A reliable form of contraception is recommended while taking this medication and for at least 4 months after stopping it. Do not breast-feed while taking this medication and for at least 4  months after stopping it. What side effects may I notice from receiving this medication? Side effects that you should report to your care team as soon as possible: Allergic reactions--skin rash, itching, hives, swelling of the face, lips, tongue, or throat Change in vision Dry cough, shortness of breath or trouble  breathing Fever, neck pain or stiffness, sensitivity to light, headache, nausea, vomiting, confusion Headache, double vision, blurry vision, sensitivity to light, eye pain Heart muscle inflammation--unusual weakness or fatigue, shortness of breath, chest pain, fast or irregular heartbeat, dizziness, swelling of the ankles, feet, or hands High blood sugar (hyperglycemia)--increased thirst or amount of urine, unusual weakness or fatigue, blurry vision High thyroid levels (hyperthyroidism)--fast or irregular heartbeat, weight loss, excessive sweating or sensitivity to heat, tremors or shaking, anxiety, nervousness, irregular menstrual cycle or spotting Infusion reactions--chest pain, shortness of breath or trouble breathing, feeling faint or lightheaded Kidney injury (glomerulonephritis)--decrease in the amount of urine, red or dark brown urine, foamy or bubbly urine, swelling of the ankles, hands, or feet Liver injury--right upper belly pain, loss of appetite, nausea, light-colored stool, dark yellow or brown urine, yellowing skin or eyes, unusual weakness or fatigue Low adrenal gland function--nausea, vomiting, loss of appetite, unusual weakness or fatigue, dizziness Low red blood cell level--unusual weakness or fatigue, dizziness, headache, trouble breathing Low thyroid levels (hypothyroidism)--unusual weakness or fatigue, increased sensitivity to cold, constipation, hair loss, dry skin, weight gain, feelings of depression Pain, tingling, or numbness in the hands or feet, muscle weakness, trouble walking, loss of balance or coordination Rash, fever, and swollen lymph nodes Redness, blistering, peeling, or loosening of the skin, including inside the mouth Sudden or severe stomach pain, bloody diarrhea, fever, nausea, vomiting Unusual bruising or bleeding Side effects that usually do not require medical attention (report these to your care team if they continue or are bothersome): Bone  pain Diarrhea Muscle pain This list may not describe all possible side effects. Call your doctor for medical advice about side effects. You may report side effects to FDA at 1-800-FDA-1088. Where should I keep my medication? This medication is given in a hospital or clinic and will not be stored at home. NOTE: This sheet is a summary. It may not cover all possible information. If you have questions about this medicine, talk to your doctor, pharmacist, or health care provider.  2023 Elsevier/Gold Standard (2021-04-21 00:00:00)

## 2021-11-13 NOTE — Progress Notes (Signed)
[facility] [phone]   [phone]  OFFICE PROGRESS NOTE  [name], DO [place] [address]  DIAGNOSIS: stage IV (TX, N2, M1 C) non-small cell lung cancer, adenocarcinoma diagnosed in August 2021 and presented with solitary brain metastasis in addition to mediastinal lymphadenopathy.  PDL1 Expression 70%   Molecular Biomarkers:  Tumor Mutational Burden - 52 Muts/Mb Microsatellite status - MS-Stable Genomic Findings For a complete list of the genes assayed, please refer to the Appendix. NF1 E1694* MTAP loss exons 2-8 RICTOR amplification ATRX A419V BRAF K483E CDKN2A/B CDKN2A loss, CDKN2B loss DNMT3A E205* FGF10 amplification NTRK1 amplification - equivocal? 7 Disease relevant genes with no reportable alterations: ALK, EGFR, ERBB2, KRAS, MET, RET, ROS1    PRIOR THERAPY:  1) Status post right craniotomy with tumor resection followed by [facility] to solitary brain metastasis under the care of Dr. Lisbeth Renshaw and Dr. Sherral Hammers. 2) Concurrent chemoradiation with weekly carboplatin for AUC of 2 and paclitaxel 45 mg/M2.  First dose 02/01/2020. Status post 5 cycles.  Last dose was given February 29, 2020.   CURRENT THERAPY:  First-line treatment with immunotherapy with Libtayo (Cempilimab) 350 mg IV every 3 weeks.  First dose April 25, 2020.  Status post 26 cycles.  INTERVAL HISTORY: Jared Shea. 66 y.o. male returns to the clinic today for follow-up visit.  The patient is feeling fine today with no concerning complaints except for the arthralgia in his knees.  He denied having any current chest pain, shortness of breath, cough or hemoptysis.  He has no nausea, vomiting, diarrhea or constipation.  He has no headache or visual changes.  He denied having any weight loss or night sweats.  He continues to tolerate his treatment with Libtayo (Cempilimab) fairly well.  He had repeat CT scan of the chest, abdomen pelvis performed recently and he  is here for evaluation and discussion of his scan results.  MEDICAL HISTORY: Past Medical History:  Diagnosis Date   Brain mass    Bursitis of right hip    Chest pain 08/12/2018   Chronic cough    Dependence on nicotine from cigarettes    Diabetes mellitus without complication ([name])    Essential hypertension 07/28/2018   nscl ca dx'd 12/2019   Seizures ([place])    Viral illness 03/23/2019    ALLERGIES:  is allergic to penicillins.  MEDICATIONS:  Current Outpatient Medications  Medication Sig Dispense Refill   albuterol (VENTOLIN HFA) 108 (90 Base) MCG/ACT inhaler Inhale 2 puffs into the lungs every 6 (six) hours as needed for wheezing or shortness of breath. 8 g 2   benzonatate (TESSALON) 100 MG capsule Take 1 capsule (100 mg total) by mouth 3 (three) times daily as needed for cough. 30 capsule 2   HYDROcodone-acetaminophen (NORCO) 5-325 MG tablet Take 1 tablet by mouth every 6 (six) hours as needed for moderate pain. 30 tablet 0   ibuprofen (ADVIL) 800 MG tablet Take 800 mg by mouth 3 (three) times daily.     levETIRAcetam (KEPPRA) 750 MG tablet Take 1 tablet (750 mg total) by mouth 2 (two) times daily. 180 tablet 4   losartan (COZAAR) 25 MG tablet Take 1 tablet (25 mg total) by mouth daily. 90 tablet 3   meloxicam (MOBIC) 7.5 MG tablet Take 1 tablet (7.5 mg total) by mouth daily. 90 tablet 0   nortriptyline (PAMELOR) 25 MG capsule TAKE 2 CAPSULES(50 MG) BY MOUTH AT BEDTIME 60 capsule 5  omeprazole (PRILOSEC) 20 MG capsule Take 20 mg by mouth daily. Pt takes in the morning     sildenafil (VIAGRA) 50 MG tablet Take 2 tablets (100 mg total) by mouth daily as needed for erectile dysfunction. 30 tablet 3   No current facility-administered medications for this visit.    SURGICAL HISTORY:  Past Surgical History:  Procedure Laterality Date   APPLICATION OF CRANIAL NAVIGATION N/A 01/07/2020   Procedure: APPLICATION OF CRANIAL NAVIGATION;  Surgeon: Ashok Pall, MD;  Location: [facility];   Service: Neurosurgery;  Laterality: N/A;   CRANIOTOMY Right 01/07/2020   Procedure: RIGHT CRANIOTOMY FOR TUMOR RESECTION;  Surgeon: Ashok Pall, MD;  Location: [facility];  Service: Neurosurgery;  Laterality: Right;  rm 21   ENDOBRONCHIAL ULTRASOUND N/A 12/21/2019   Procedure: ENDOBRONCHIAL ULTRASOUND;  Surgeon: Laurin Coder, MD;  Location: [facility];  Service: Pulmonary;  Laterality: N/A;   FINE NEEDLE ASPIRATION  12/21/2019   Procedure: FINE NEEDLE ASPIRATION (FNA) LINEAR;  Surgeon: Laurin Coder, MD;  Location: [facility];  Service: Pulmonary;;   IR IMAGING GUIDED PORT INSERTION  03/03/2020   NO PAST SURGERIES     VIDEO BRONCHOSCOPY N/A 12/21/2019   Procedure: VIDEO BRONCHOSCOPY WITHOUT FLUORO;  Surgeon: Laurin Coder, MD;  Location: [facility];  Service: Pulmonary;  Laterality: N/A;    REVIEW OF SYSTEMS:  Constitutional: negative Eyes: negative Ears, nose, mouth, throat, and face: negative Respiratory: negative Cardiovascular: negative Gastrointestinal: negative Genitourinary:negative Integument/breast: negative Hematologic/lymphatic: negative Musculoskeletal:positive for arthralgias Neurological: negative Behavioral/Psych: negative Endocrine: negative Allergic/Immunologic: negative   PHYSICAL EXAMINATION: General appearance: alert, cooperative, appears older than stated age, and no distress Head: Normocephalic, without obvious abnormality, atraumatic Neck: no adenopathy, no JVD, supple, symmetrical, trachea midline, and thyroid not enlarged, symmetric, no tenderness/mass/nodules Lymph nodes: Cervical, supraclavicular, and axillary nodes normal. Resp: clear to auscultation bilaterally Back: symmetric, no curvature. ROM normal. No CVA tenderness. Cardio: regular rate and rhythm, S1, S2 normal, no murmur, click, rub or gallop GI: soft, non-tender; bowel sounds normal; no masses,  no organomegaly Extremities: extremities normal, atraumatic, no cyanosis or  edema Neurologic: Alert and oriented X 3, normal strength and tone. Normal symmetric reflexes. Normal coordination and gait  ECOG PERFORMANCE STATUS: 1 - Symptomatic but completely ambulatory  Blood pressure (!) 159/85, pulse 80, temperature 98.5 F (36.9 C), temperature source Tympanic, resp. rate 18, height _0  (1.854 m), weight 189 lb 1.6 oz (85.8 kg), SpO2 100 %.  LABORATORY DATA: Lab Results  Component Value Date   WBC 6.2 11/13/2021   HGB 12.3 (L) 11/13/2021   HCT 35.3 (L) 11/13/2021   MCV 86.1 11/13/2021   PLT 336 11/13/2021      Chemistry      Component Value Date/Time   NA 141 11/13/2021 1014   NA 140 11/23/2019 1516   K 3.3 (L) 11/13/2021 1014   CL 109 11/13/2021 1014   CO2 26 11/13/2021 1014   BUN 16 11/13/2021 1014   BUN 14 11/23/2019 1516   CREATININE 0.70 11/13/2021 1014      Component Value Date/Time   CALCIUM 9.1 11/13/2021 1014   ALKPHOS 81 11/13/2021 1014   AST 22 11/13/2021 1014   ALT 25 11/13/2021 1014   BILITOT 0.7 11/13/2021 1014       RADIOGRAPHIC STUDIES: CT Chest W Contrast  Result Date: 11/08/2021 CLINICAL DATA:  Metastatic non-small cell lung cancer. EXAM: CT CHEST, ABDOMEN, AND PELVIS WITH CONTRAST TECHNIQUE: Multidetector CT imaging of the chest, abdomen and pelvis was  performed following the standard protocol during bolus administration of intravenous contrast. RADIATION DOSE REDUCTION: This exam was performed according to the departmental dose-optimization program which includes automated exposure control, adjustment of the mA and/or kV according to patient size and/or use of iterative reconstruction technique. CONTRAST:  180m OMNIPAQUE IOHEXOL 300 MG/ML  SOLN COMPARISON:  Multiple previous imaging studies. The most recent CT scan is 09/06/2021 FINDINGS: CT CHEST FINDINGS Cardiovascular: The heart is normal in size. No pericardial effusion. The aorta is normal in caliber. No dissection. Stable coronary artery calcifications.  Mediastinum/Nodes: No mediastinal or hilar mass or lymphadenopathy. Small scattered lymph nodes are unchanged. Lungs/Pleura: New 14 x 11 x 11 mm solid-appearing lesion in the right upper lobe adjacent to the major fissure. No abnormality was seen in this area on the prior studies. It is possible this is an area subpleural atelectasis. No other pulmonary nodules or pulmonary lesions are identified. Stable area of scarring change in the medial aspect of the left lower lobe. The lungs show improved aeration with resolution of interstitial process seen on the prior chest CT which was likely bronchitis. No pleural effusions. Musculoskeletal: No chest wall mass, supraclavicular or axillary adenopathy. The bony thorax is intact. No worrisome bone lesions. CT ABDOMEN PELVIS FINDINGS Hepatobiliary: No hepatic lesions to suggest metastatic disease. No intrahepatic biliary dilatation. The gallbladder is normal. No common bile duct dilatation. Pancreas: No mass, inflammation or ductal dilatation. Spleen: Normal size. No focal lesions. Adrenals/Urinary Tract: Adrenal glands are normal. No renal lesions or hydronephrosis. The bladder is unremarkable. Stomach/Bowel: The stomach, duodenum, small bowel and colon are unremarkable. The appendix is normal. Scattered sigmoid colon diverticulosis. Vascular/Lymphatic: Stable atherosclerotic calcifications involving the aorta and iliac arteries but no aneurysm. The major venous structures are patent. Stable scattered abdominal and pelvic lymph nodes but no mass or overt adenopathy. Reproductive: The prostate gland and seminal vesicles are unremarkable. Other: No pelvic mass or adenopathy. No free pelvic fluid collections. No inguinal mass or adenopathy. No abdominal wall hernia or subcutaneous lesions. Musculoskeletal: No significant bony findings. No worrisome bone lesions. IMPRESSION: 1. New 14 x 11 x 11 mm solid-appearing right upper lobe lesion adjacent to the major fissure. It is  possible this is an area of subpleural atelectasis. Recommend short-term follow-up noncontrast chest CT in 3 months. 2. No mediastinal or hilar mass or adenopathy. 3. No findings for abdominal/pelvic metastatic disease. Aortic Atherosclerosis (ICD10-I70.0). Electronically Signed   By: PMarijo SanesM.D.   On: 11/08/2021 16:12   CT Abdomen Pelvis W Contrast  Result Date: 11/08/2021 CLINICAL DATA:  Metastatic non-small cell lung cancer. EXAM: CT CHEST, ABDOMEN, AND PELVIS WITH CONTRAST TECHNIQUE: Multidetector CT imaging of the chest, abdomen and pelvis was performed following the standard protocol during bolus administration of intravenous contrast. RADIATION DOSE REDUCTION: This exam was performed according to the departmental dose-optimization program which includes automated exposure control, adjustment of the mA and/or kV according to patient size and/or use of iterative reconstruction technique. CONTRAST:  1068mOMNIPAQUE IOHEXOL 300 MG/ML  SOLN COMPARISON:  Multiple previous imaging studies. The most recent CT scan is 09/06/2021 FINDINGS: CT CHEST FINDINGS Cardiovascular: The heart is normal in size. No pericardial effusion. The aorta is normal in caliber. No dissection. Stable coronary artery calcifications. Mediastinum/Nodes: No mediastinal or hilar mass or lymphadenopathy. Small scattered lymph nodes are unchanged. Lungs/Pleura: New 14 x 11 x 11 mm solid-appearing lesion in the right upper lobe adjacent to the major fissure. No abnormality was seen in this area on  the prior studies. It is possible this is an area subpleural atelectasis. No other pulmonary nodules or pulmonary lesions are identified. Stable area of scarring change in the medial aspect of the left lower lobe. The lungs show improved aeration with resolution of interstitial process seen on the prior chest CT which was likely bronchitis. No pleural effusions. Musculoskeletal: No chest wall mass, supraclavicular or axillary adenopathy. The  bony thorax is intact. No worrisome bone lesions. CT ABDOMEN PELVIS FINDINGS Hepatobiliary: No hepatic lesions to suggest metastatic disease. No intrahepatic biliary dilatation. The gallbladder is normal. No common bile duct dilatation. Pancreas: No mass, inflammation or ductal dilatation. Spleen: Normal size. No focal lesions. Adrenals/Urinary Tract: Adrenal glands are normal. No renal lesions or hydronephrosis. The bladder is unremarkable. Stomach/Bowel: The stomach, duodenum, small bowel and colon are unremarkable. The appendix is normal. Scattered sigmoid colon diverticulosis. Vascular/Lymphatic: Stable atherosclerotic calcifications involving the aorta and iliac arteries but no aneurysm. The major venous structures are patent. Stable scattered abdominal and pelvic lymph nodes but no mass or overt adenopathy. Reproductive: The prostate gland and seminal vesicles are unremarkable. Other: No pelvic mass or adenopathy. No free pelvic fluid collections. No inguinal mass or adenopathy. No abdominal wall hernia or subcutaneous lesions. Musculoskeletal: No significant bony findings. No worrisome bone lesions. IMPRESSION: 1. New 14 x 11 x 11 mm solid-appearing right upper lobe lesion adjacent to the major fissure. It is possible this is an area of subpleural atelectasis. Recommend short-term follow-up noncontrast chest CT in 3 months. 2. No mediastinal or hilar mass or adenopathy. 3. No findings for abdominal/pelvic metastatic disease. Aortic Atherosclerosis (ICD10-I70.0). Electronically Signed   By: Marijo Sanes M.D.   On: 11/08/2021 16:12     ASSESSMENT AND PLAN: This is a very pleasant 66 years old African-American male recently diagnosed with a stage IV (TX, N2, M1c) non-small cell lung cancer, adenocarcinoma presented with solitary brain metastasis in addition to right hilar and mediastinal lymphadenopathy diagnosed in August 2021.  The patient is status post right craniotomy with resection of the solitary  brain metastasis with SRS. His PET scan showed no evidence of metastatic disease outside the chest. The patient was treated with a course of concurrent chemoradiation with weekly carboplatin and paclitaxel status post 5 cycles.  He tolerated his treatment well except for dysphagia and odynophagia. His PD-L1 expression is 70%. The patient started treatment with immunotherapy with Cemiplimab 350 mg IV every 3 weeks status post 26 cycles.  The patient has been tolerating this treatment well except for the arthralgia. He had repeat CT scan of the chest, abdomen and pelvis performed recently.  I personally and independently reviewed the scan images and discussed the results with the patient today. His scan showed no concerning evidence for disease progression except for new 1.4 x 1.1 x 1.1 cm solid-appearing right upper lobe lesion adjacent to the major fissure questionable for an area of subpleural atelectasis but close monitoring is advised. I recommended for the patient to continue his current treatment with Libtayo (Cempilimab) with the same dose. I will see him back for follow-up visit in 3 weeks for evaluation before the next cycle of his treatment. I will continue to monitor the new nodule in the right upper lobe closely and if it is consistent with malignancy, I may consider The patient for SBRT to this lesion if there is no other evidence of metastatic disease. For the arthralgia, the patient is followed by orthopedic surgery and he is expected to have another knee  injection in the near future. He was advised to call immediately if he has any other concerning symptoms in the interval.  The patient voices understanding of current disease status and treatment options and is in agreement with the current care plan.  All questions were answered. The patient knows to call the clinic with any problems, questions or concerns. We can certainly see the patient much sooner if necessary.   Disclaimer:  This note was dictated with voice recognition software. Similar sounding words can inadvertently be transcribed and may not be corrected upon review.

## 2021-12-04 ENCOUNTER — Other Ambulatory Visit: Payer: Self-pay

## 2021-12-04 ENCOUNTER — Inpatient Hospital Stay: Payer: Medicare HMO

## 2021-12-04 ENCOUNTER — Encounter: Payer: Self-pay | Admitting: Internal Medicine

## 2021-12-04 ENCOUNTER — Inpatient Hospital Stay (HOSPITAL_BASED_OUTPATIENT_CLINIC_OR_DEPARTMENT_OTHER): Payer: Medicare HMO | Admitting: Internal Medicine

## 2021-12-04 ENCOUNTER — Other Ambulatory Visit: Payer: Medicare HMO

## 2021-12-04 VITALS — BP 163/82 | HR 77 | Temp 97.9°F | Resp 18 | Ht 73.0 in | Wt 188.5 lb

## 2021-12-04 VITALS — BP 158/82 | HR 74 | Temp 98.0°F | Resp 18

## 2021-12-04 DIAGNOSIS — C7931 Secondary malignant neoplasm of brain: Secondary | ICD-10-CM | POA: Diagnosis not present

## 2021-12-04 DIAGNOSIS — C799 Secondary malignant neoplasm of unspecified site: Secondary | ICD-10-CM

## 2021-12-04 DIAGNOSIS — Z95828 Presence of other vascular implants and grafts: Secondary | ICD-10-CM

## 2021-12-04 DIAGNOSIS — C3490 Malignant neoplasm of unspecified part of unspecified bronchus or lung: Secondary | ICD-10-CM

## 2021-12-04 DIAGNOSIS — Z5112 Encounter for antineoplastic immunotherapy: Secondary | ICD-10-CM | POA: Diagnosis not present

## 2021-12-04 DIAGNOSIS — C3491 Malignant neoplasm of unspecified part of right bronchus or lung: Secondary | ICD-10-CM

## 2021-12-04 LAB — CMP (CANCER CENTER ONLY)
ALT: 19 U/L (ref 0–44)
AST: 20 U/L (ref 15–41)
Albumin: 4.2 g/dL (ref 3.5–5.0)
Alkaline Phosphatase: 80 U/L (ref 38–126)
Anion gap: 7 (ref 5–15)
BUN: 14 mg/dL (ref 8–23)
CO2: 26 mmol/L (ref 22–32)
Calcium: 8.9 mg/dL (ref 8.9–10.3)
Chloride: 108 mmol/L (ref 98–111)
Creatinine: 0.67 mg/dL (ref 0.61–1.24)
GFR, Estimated: >60 mL/min (ref >60)
Glucose, Bld: 143 mg/dL — ABNORMAL HIGH (ref 70–99)
Potassium: 3.3 mmol/L — ABNORMAL LOW (ref 3.5–5.1)
Sodium: 141 mmol/L (ref 135–145)
Total Bilirubin: 0.8 mg/dL (ref 0.3–1.2)
Total Protein: 7.3 g/dL (ref 6.5–8.1)

## 2021-12-04 LAB — CBC WITH DIFFERENTIAL (CANCER CENTER ONLY)
Abs Immature Granulocytes: 0.02 10*3/uL (ref 0.00–0.07)
Basophils Absolute: 0 10*3/uL (ref 0.0–0.1)
Basophils Relative: 1 %
Eosinophils Absolute: 0.1 10*3/uL (ref 0.0–0.5)
Eosinophils Relative: 3 %
HCT: 34.3 % — ABNORMAL LOW (ref 39.0–52.0)
Hemoglobin: 12 g/dL — ABNORMAL LOW (ref 13.0–17.0)
Immature Granulocytes: 0 %
Lymphocytes Relative: 26 %
Lymphs Abs: 1.4 10*3/uL (ref 0.7–4.0)
MCH: 29.9 pg (ref 26.0–34.0)
MCHC: 35 g/dL (ref 30.0–36.0)
MCV: 85.3 fL (ref 80.0–100.0)
Monocytes Absolute: 0.5 10*3/uL (ref 0.1–1.0)
Monocytes Relative: 9 %
Neutro Abs: 3.3 10*3/uL (ref 1.7–7.7)
Neutrophils Relative %: 61 %
Platelet Count: 272 10*3/uL (ref 150–400)
RBC: 4.02 MIL/uL — ABNORMAL LOW (ref 4.22–5.81)
RDW: 14.2 % (ref 11.5–15.5)
WBC Count: 5.3 10*3/uL (ref 4.0–10.5)
nRBC: 0 % (ref 0.0–0.2)

## 2021-12-04 LAB — TSH: TSH: 1.287 u[IU]/mL (ref 0.350–4.500)

## 2021-12-04 MED ORDER — SODIUM CHLORIDE 0.9 % IV SOLN
350.0000 mg | Freq: Once | INTRAVENOUS | Status: AC
  Administered 2021-12-04: 350 mg via INTRAVENOUS
  Filled 2021-12-04: qty 7

## 2021-12-04 MED ORDER — HEPARIN SOD (PORK) LOCK FLUSH 100 UNIT/ML IV SOLN
500.0000 [IU] | Freq: Once | INTRAVENOUS | Status: AC | PRN
  Administered 2021-12-04: 500 [IU]

## 2021-12-04 MED ORDER — SODIUM CHLORIDE 0.9 % IV SOLN: Freq: Once | INTRAVENOUS | Status: AC

## 2021-12-04 MED ORDER — SODIUM CHLORIDE 0.9% FLUSH
10.0000 mL | Freq: Once | INTRAVENOUS | Status: AC
  Administered 2021-12-04: 10 mL

## 2021-12-04 MED ORDER — SODIUM CHLORIDE 0.9% FLUSH
10.0000 mL | INTRAVENOUS | Status: DC | PRN
  Administered 2021-12-04: 10 mL

## 2021-12-04 NOTE — Progress Notes (Signed)
[facility] [phone]   Fax:(336) [phone]  OFFICE PROGRESS NOTE  [name], DO [place] [address]  DIAGNOSIS: stage IV (TX, N2, M1 C) non-small cell lung cancer, adenocarcinoma diagnosed in August 2021 and presented with solitary brain metastasis in addition to mediastinal lymphadenopathy.  PDL1 Expression 70%   Molecular Biomarkers:  Tumor Mutational Burden - 52 Muts/Mb Microsatellite status - MS-Stable Genomic Findings For a complete list of the genes assayed, please refer to the Appendix. NF1 E1694* MTAP loss exons 2-8 RICTOR amplification ATRX A419V BRAF K483E CDKN2A/B CDKN2A loss, CDKN2B loss DNMT3A E205* FGF10 amplification NTRK1 amplification - equivocal? 7 Disease relevant genes with no reportable alterations: ALK, EGFR, ERBB2, KRAS, MET, RET, ROS1    PRIOR THERAPY:  1) Status post right craniotomy with tumor resection followed by [facility] to solitary brain metastasis under the care of Dr. Lisbeth Renshaw and Dr. Sherral Hammers. 2) Concurrent chemoradiation with weekly carboplatin for AUC of 2 and paclitaxel 45 mg/M2.  First dose 02/01/2020. Status post 5 cycles.  Last dose was given February 29, 2020.   CURRENT THERAPY:  First-line treatment with immunotherapy with Libtayo (Cempilimab) 350 mg IV every 3 weeks.  First dose April 25, 2020.  Status post 27 cycles.  INTERVAL HISTORY: Jared Shea. 66 y.o. male returns to the clinic today for follow-up visit.  The patient is feeling fine today with no concerning complaints except for the arthritis in his knee.  He denied having any chest pain, shortness of breath, cough or hemoptysis.  He denied having any fever or chills.  He has no nausea, vomiting, diarrhea or constipation.  He has no headache or visual changes.  He has some concern about the nodule that was noted on the previous scan.  The patient is here today for evaluation before starting cycle #28 of his treatment.  MEDICAL  HISTORY: Past Medical History:  Diagnosis Date   Brain mass    Bursitis of right hip    Chest pain 08/12/2018   Chronic cough    Dependence on nicotine from cigarettes    Diabetes mellitus without complication ([place])    Essential hypertension 07/28/2018   nscl ca dx'd 12/2019   Seizures ([place])    Viral illness 03/23/2019    ALLERGIES:  is allergic to penicillins.  MEDICATIONS:  Current Outpatient Medications  Medication Sig Dispense Refill   ibuprofen (ADVIL) 800 MG tablet Take 800 mg by mouth 3 (three) times daily.     levETIRAcetam (KEPPRA) 750 MG tablet Take 1 tablet (750 mg total) by mouth 2 (two) times daily. 180 tablet 4   losartan (COZAAR) 25 MG tablet Take 1 tablet (25 mg total) by mouth daily. 90 tablet 3   nortriptyline (PAMELOR) 25 MG capsule TAKE 2 CAPSULES(50 MG) BY MOUTH AT BEDTIME 60 capsule 5   albuterol (VENTOLIN HFA) 108 (90 Base) MCG/ACT inhaler Inhale 2 puffs into the lungs every 6 (six) hours as needed for wheezing or shortness of breath. (Patient not taking: Reported on 12/04/2021) 8 g 2   benzonatate (TESSALON) 100 MG capsule Take 1 capsule (100 mg total) by mouth 3 (three) times daily as needed for cough. (Patient not taking: Reported on 12/04/2021) 30 capsule 2   HYDROcodone-acetaminophen (NORCO) 5-325 MG tablet Take 1 tablet by mouth every 6 (six) hours as needed for moderate pain. (Patient not taking: Reported on 12/04/2021) 30 tablet 0   meloxicam (MOBIC) 7.5 MG tablet Take 1 tablet (7.5 mg  total) by mouth daily. (Patient not taking: Reported on 12/04/2021) 90 tablet 0   omeprazole (PRILOSEC) 20 MG capsule Take 20 mg by mouth daily. Pt takes in the morning (Patient not taking: Reported on 12/04/2021)     sildenafil (VIAGRA) 50 MG tablet Take 2 tablets (100 mg total) by mouth daily as needed for erectile dysfunction. (Patient not taking: Reported on 12/04/2021) 30 tablet 3   No current facility-administered medications for this visit.    SURGICAL HISTORY:  Past  Surgical History:  Procedure Laterality Date   APPLICATION OF CRANIAL NAVIGATION N/A 01/07/2020   Procedure: APPLICATION OF CRANIAL NAVIGATION;  Surgeon: Ashok Pall, MD;  Location: [facility];  Service: Neurosurgery;  Laterality: N/A;   CRANIOTOMY Right 01/07/2020   Procedure: RIGHT CRANIOTOMY FOR TUMOR RESECTION;  Surgeon: Ashok Pall, MD;  Location: [facility];  Service: Neurosurgery;  Laterality: Right;  rm 21   ENDOBRONCHIAL ULTRASOUND N/A 12/21/2019   Procedure: ENDOBRONCHIAL ULTRASOUND;  Surgeon: Laurin Coder, MD;  [facility];  Service: Pulmonary;  Laterality: N/A;   FINE NEEDLE ASPIRATION  12/21/2019   Procedure: FINE NEEDLE ASPIRATION (FNA) LINEAR;  Surgeon: Laurin Coder, MD;  [facility];  Service: Pulmonary;;   IR IMAGING GUIDED PORT INSERTION  03/03/2020   NO PAST SURGERIES     VIDEO BRONCHOSCOPY N/A 12/21/2019   Procedure: VIDEO BRONCHOSCOPY WITHOUT FLUORO;  Surgeon: Laurin Coder, MD;  [facility];  Service: Pulmonary;  Laterality: N/A;    REVIEW OF SYSTEMS:  A comprehensive review of systems was negative except for: Musculoskeletal: positive for arthralgias   PHYSICAL EXAMINATION: General appearance: alert, cooperative, appears older than stated age, and no distress Head: Normocephalic, without obvious abnormality, atraumatic Neck: no adenopathy, no JVD, supple, symmetrical, trachea midline, and thyroid not enlarged, symmetric, no tenderness/mass/nodules Lymph nodes: Cervical, supraclavicular, and axillary nodes normal. Resp: clear to auscultation bilaterally Back: symmetric, no curvature. ROM normal. No CVA tenderness. Cardio: regular rate and rhythm, S1, S2 normal, no murmur, click, rub or gallop GI: soft, non-tender; bowel sounds normal; no masses,  no organomegaly Extremities: extremities normal, atraumatic, no cyanosis or edema  ECOG PERFORMANCE STATUS: 1 - Symptomatic but completely ambulatory  Blood pressure (!) 163/82, pulse  77, temperature 97.9 F (36.6 C), temperature source Oral, resp. rate 18, height $RemoveBe'6\' 1"'iPeystmnd$  (1.854 m), weight 188 lb 8 oz (85.5 kg), SpO2 100 %.  LABORATORY DATA: Lab Results  Component Value Date   WBC 5.3 12/04/2021   HGB 12.0 (L) 12/04/2021   HCT 34.3 (L) 12/04/2021   MCV 85.3 12/04/2021   PLT 272 12/04/2021      Chemistry      Component Value Date/Time   NA 141 11/13/2021 1014   NA 140 11/23/2019 1516   K 3.3 (L) 11/13/2021 1014   CL 109 11/13/2021 1014   CO2 26 11/13/2021 1014   BUN 16 11/13/2021 1014   BUN 14 11/23/2019 1516   CREATININE 0.70 11/13/2021 1014      Component Value Date/Time   CALCIUM 9.1 11/13/2021 1014   ALKPHOS 81 11/13/2021 1014   AST 22 11/13/2021 1014   ALT 25 11/13/2021 1014   BILITOT 0.7 11/13/2021 1014       RADIOGRAPHIC STUDIES: CT Chest W Contrast  Result Date: 11/08/2021 CLINICAL DATA:  Metastatic non-small cell lung cancer. EXAM: CT CHEST, ABDOMEN, AND PELVIS WITH CONTRAST TECHNIQUE: Multidetector CT imaging of the chest, abdomen and pelvis was performed following the standard protocol during bolus administration of intravenous  contrast. RADIATION DOSE REDUCTION: This exam was performed according to the departmental dose-optimization program which includes automated exposure control, adjustment of the mA and/or kV according to patient size and/or use of iterative reconstruction technique. CONTRAST:  138m OMNIPAQUE IOHEXOL 300 MG/ML  SOLN COMPARISON:  Multiple previous imaging studies. The most recent CT scan is 09/06/2021 FINDINGS: CT CHEST FINDINGS Cardiovascular: The heart is normal in size. No pericardial effusion. The aorta is normal in caliber. No dissection. Stable coronary artery calcifications. Mediastinum/Nodes: No mediastinal or hilar mass or lymphadenopathy. Small scattered lymph nodes are unchanged. Lungs/Pleura: New 14 x 11 x 11 mm solid-appearing lesion in the right upper lobe adjacent to the major fissure. No abnormality was seen in  this area on the prior studies. It is possible this is an area subpleural atelectasis. No other pulmonary nodules or pulmonary lesions are identified. Stable area of scarring change in the medial aspect of the left lower lobe. The lungs show improved aeration with resolution of interstitial process seen on the prior chest CT which was likely bronchitis. No pleural effusions. Musculoskeletal: No chest wall mass, supraclavicular or axillary adenopathy. The bony thorax is intact. No worrisome bone lesions. CT ABDOMEN PELVIS FINDINGS Hepatobiliary: No hepatic lesions to suggest metastatic disease. No intrahepatic biliary dilatation. The gallbladder is normal. No common bile duct dilatation. Pancreas: No mass, inflammation or ductal dilatation. Spleen: Normal size. No focal lesions. Adrenals/Urinary Tract: Adrenal glands are normal. No renal lesions or hydronephrosis. The bladder is unremarkable. Stomach/Bowel: The stomach, duodenum, small bowel and colon are unremarkable. The appendix is normal. Scattered sigmoid colon diverticulosis. Vascular/Lymphatic: Stable atherosclerotic calcifications involving the aorta and iliac arteries but no aneurysm. The major venous structures are patent. Stable scattered abdominal and pelvic lymph nodes but no mass or overt adenopathy. Reproductive: The prostate gland and seminal vesicles are unremarkable. Other: No pelvic mass or adenopathy. No free pelvic fluid collections. No inguinal mass or adenopathy. No abdominal wall hernia or subcutaneous lesions. Musculoskeletal: No significant bony findings. No worrisome bone lesions. IMPRESSION: 1. New 14 x 11 x 11 mm solid-appearing right upper lobe lesion adjacent to the major fissure. It is possible this is an area of subpleural atelectasis. Recommend short-term follow-up noncontrast chest CT in 3 months. 2. No mediastinal or hilar mass or adenopathy. 3. No findings for abdominal/pelvic metastatic disease. Aortic Atherosclerosis  (ICD10-I70.0). Electronically Signed   By: PMarijo SanesM.D.   On: 11/08/2021 16:12   CT Abdomen Pelvis W Contrast  Result Date: 11/08/2021 CLINICAL DATA:  Metastatic non-small cell lung cancer. EXAM: CT CHEST, ABDOMEN, AND PELVIS WITH CONTRAST TECHNIQUE: Multidetector CT imaging of the chest, abdomen and pelvis was performed following the standard protocol during bolus administration of intravenous contrast. RADIATION DOSE REDUCTION: This exam was performed according to the departmental dose-optimization program which includes automated exposure control, adjustment of the mA and/or kV according to patient size and/or use of iterative reconstruction technique. CONTRAST:  1048mOMNIPAQUE IOHEXOL 300 MG/ML  SOLN COMPARISON:  Multiple previous imaging studies. The most recent CT scan is 09/06/2021 FINDINGS: CT CHEST FINDINGS Cardiovascular: The heart is normal in size. No pericardial effusion. The aorta is normal in caliber. No dissection. Stable coronary artery calcifications. Mediastinum/Nodes: No mediastinal or hilar mass or lymphadenopathy. Small scattered lymph nodes are unchanged. Lungs/Pleura: New 14 x 11 x 11 mm solid-appearing lesion in the right upper lobe adjacent to the major fissure. No abnormality was seen in this area on the prior studies. It is possible this is an area  subpleural atelectasis. No other pulmonary nodules or pulmonary lesions are identified. Stable area of scarring change in the medial aspect of the left lower lobe. The lungs show improved aeration with resolution of interstitial process seen on the prior chest CT which was likely bronchitis. No pleural effusions. Musculoskeletal: No chest wall mass, supraclavicular or axillary adenopathy. The bony thorax is intact. No worrisome bone lesions. CT ABDOMEN PELVIS FINDINGS Hepatobiliary: No hepatic lesions to suggest metastatic disease. No intrahepatic biliary dilatation. The gallbladder is normal. No common bile duct dilatation.  Pancreas: No mass, inflammation or ductal dilatation. Spleen: Normal size. No focal lesions. Adrenals/Urinary Tract: Adrenal glands are normal. No renal lesions or hydronephrosis. The bladder is unremarkable. Stomach/Bowel: The stomach, duodenum, small bowel and colon are unremarkable. The appendix is normal. Scattered sigmoid colon diverticulosis. Vascular/Lymphatic: Stable atherosclerotic calcifications involving the aorta and iliac arteries but no aneurysm. The major venous structures are patent. Stable scattered abdominal and pelvic lymph nodes but no mass or overt adenopathy. Reproductive: The prostate gland and seminal vesicles are unremarkable. Other: No pelvic mass or adenopathy. No free pelvic fluid collections. No inguinal mass or adenopathy. No abdominal wall hernia or subcutaneous lesions. Musculoskeletal: No significant bony findings. No worrisome bone lesions. IMPRESSION: 1. New 14 x 11 x 11 mm solid-appearing right upper lobe lesion adjacent to the major fissure. It is possible this is an area of subpleural atelectasis. Recommend short-term follow-up noncontrast chest CT in 3 months. 2. No mediastinal or hilar mass or adenopathy. 3. No findings for abdominal/pelvic metastatic disease. Aortic Atherosclerosis (ICD10-I70.0). Electronically Signed   By: Marijo Sanes M.D.   On: 11/08/2021 16:12     ASSESSMENT AND PLAN: This is a very pleasant 66 years old African-American male recently diagnosed with a stage IV (TX, N2, M1c) non-small cell lung cancer, adenocarcinoma presented with solitary brain metastasis in addition to right hilar and mediastinal lymphadenopathy diagnosed in August 2021.  The patient is status post right craniotomy with resection of the solitary brain metastasis with SRS. His PET scan showed no evidence of metastatic disease outside the chest. The patient was treated with a course of concurrent chemoradiation with weekly carboplatin and paclitaxel status post 5 cycles.  He  tolerated his treatment well except for dysphagia and odynophagia. His PD-L1 expression is 70%. The patient started treatment with immunotherapy with Cemiplimab 350 mg IV every 3 weeks status post 27 cycles.   The patient has been tolerating his treatment well with no concerning adverse effect except for the arthralgia in his knees and he is scheduled to see his orthopedic surgeon again for consideration of injection. I recommended for him to proceed with cycle #28 today as planned. I will see him back for follow-up visit in 3 weeks for evaluation before starting cycle #29. He was advised to call immediately if he has any other concerning symptoms in the interval.  The patient voices understanding of current disease status and treatment options and is in agreement with the current care plan.  All questions were answered. The patient knows to call the clinic with any problems, questions or concerns. We can certainly see the patient much sooner if necessary.   Disclaimer: This note was dictated with voice recognition software. Similar sounding words can inadvertently be transcribed and may not be corrected upon review.

## 2021-12-04 NOTE — Patient Instructions (Signed)
[facility]COL[facility]  Discharge Instructions: Thank you for choosing [facility] to provide your oncology and hematology care.   If you have a lab appointment with the [facility], please go directly to the [facility] and check in at the registration area.   Wear comfortable clothing and clothing appropriate for easy access to any Portacath or PICC line.   We strive to give you quality time with your provider. You may need to reschedule your appointment if you arrive late (15 or more minutes).  Arriving late affects you and other patients whose appointments are after yours.  Also, if you miss three or more appointments without notifying the office, you may be dismissed from the clinic at the provider's discretion.      For prescription refill requests, have your pharmacy contact our office and allow 72 hours for refills to be completed.    Today you received the following chemotherapy and/or immunotherapy agents: Libtayo      To help prevent nausea and vomiting after your treatment, we encourage you to take your nausea medication as directed.  BELOW ARE SYMPTOMS THAT SHOULD BE REPORTED IMMEDIATELY: *FEVER GREATER THAN 100.4 F (38 C) OR HIGHER *CHILLS OR SWEATING *NAUSEA AND VOMITING THAT IS NOT CONTROLLED WITH YOUR NAUSEA MEDICATION *UNUSUAL SHORTNESS OF BREATH *UNUSUAL BRUISING OR BLEEDING *URINARY PROBLEMS (pain or burning when urinating, or frequent urination) *BOWEL PROBLEMS (unusual diarrhea, constipation, pain near the anus) TENDERNESS IN MOUTH AND THROAT WITH OR WITHOUT PRESENCE OF ULCERS (sore throat, sores in mouth, or a toothache) UNUSUAL RASH, SWELLING OR PAIN  UNUSUAL VAGINAL DISCHARGE OR ITCHING   Items with * indicate a potential emergency and should be followed up as soon as possible or go to the Emergency Department if any problems should occur.  Please show the CHEMOTHERAPY ALERT CARD or IMMUNOTHERAPY ALERT CARD at check-in to  the Emergency Department and triage nurse.  Should you have questions after your visit or need to cancel or reschedule your appointment, please contact [facility]  Dept: [phone]  and follow the prompts.  Office hours are 8:00 a.m. to 4:30 p.m. Monday - Friday. Please note that voicemails left after 4:00 p.m. may not be returned until the following business day.  We are closed weekends and major holidays. You have access to a nurse at all times for urgent questions. Please call the main number to the clinic Dept: [phone] and follow the prompts.   For any non-urgent questions, you may also contact your provider using MyChart. We now offer e-Visits for anyone 8 and older to request care online for non-urgent symptoms. For details visit [email or web address].   Also download the MyChart app! Go to the app store, search "MyChart", open the app, select [facility], and log in with your MyChart username and password.  Due to Covid, a mask is required upon entering the hospital/clinic. If you do not have a mask, one will be given to you upon arrival. For doctor visits, patients may have 1 support person aged 68 or older with them. For treatment visits, patients cannot have anyone with them due to current Covid guidelines and our immunocompromised population.

## 2021-12-06 ENCOUNTER — Ambulatory Visit (INDEPENDENT_AMBULATORY_CARE_PROVIDER_SITE_OTHER): Payer: Medicare HMO | Admitting: Orthopaedic Surgery

## 2021-12-06 DIAGNOSIS — M170 Bilateral primary osteoarthritis of knee: Secondary | ICD-10-CM

## 2021-12-06 HISTORY — DX: Bilateral primary osteoarthritis of knee: M17.0

## 2021-12-06 MED ORDER — CELECOXIB 200 MG PO CAPS: 200.0000 mg | ORAL_CAPSULE | Freq: Two times a day (BID) | ORAL | 3 refills | Status: DC

## 2021-12-06 NOTE — Progress Notes (Signed)
Office Visit Note   Patient: Jared Shea.           Date of Birth: 17-May-1955           MRN: [id number] Visit Date: 12/06/2021              Requested by: [name], DO [address] [place],  [place] [address] PCP: [name], DO   Assessment & Plan: Visit Diagnoses:  1. Bilateral primary osteoarthritis of knee     Plan: Impression bilateral knee OA.  X-rays do not show a significant mount of DJD and Monovisc did not improve symptoms.  We will obtain MRI of both knees to confirm that there is nothing else going on.  Celebrex prescribed.  Follow-up after the MRI.  Follow-Up Instructions: No follow-ups on file.   Orders:  No orders of the defined types were placed in this encounter.  Meds ordered this encounter  Medications   celecoxib (CELEBREX) 200 MG capsule    Sig: Take 1 capsule (200 mg total) by mouth 2 (two) times daily.    Dispense:  30 capsule    Refill:  3      Procedures: No procedures performed   Clinical Data: No additional findings.   Subjective: Chief Complaint  Patient presents with   Right Knee - Pain   Left Knee - Pain    HPI Patient returns today for bilateral knee pain.  Underwent Monovisc injection on 10/18/2021 which did not provide any relief. Review of Systems  Constitutional: Negative.   All other systems reviewed and are negative.    Objective: Vital Signs: There were no vitals taken for this visit.  Physical Exam Vitals and nursing note reviewed.  Constitutional:      Appearance: He is well-developed.  Pulmonary:     Effort: Pulmonary effort is normal.  Abdominal:     Palpations: Abdomen is soft.  Skin:    General: Skin is warm.  Neurological:     Mental Status: He is alert and oriented to person, place, and time.  Psychiatric:        Behavior: Behavior normal.        Thought Content: Thought content normal.        Judgment: Judgment normal.     Ortho Exam Bilateral knee exams are unchanged. Specialty  Comments:  No specialty comments available.  Imaging: No results found.   PMFS History: Patient Active Problem List   Diagnosis Date Noted   Bilateral primary osteoarthritis of knee 12/06/2021   Chills 09/08/2021   Primary osteoarthritis of right knee 08/15/2021   Primary osteoarthritis of left knee 08/15/2021   Erectile dysfunction 07/11/2021   Knee pain 07/11/2021   Dysuria 04/03/2021   Vision changes 03/14/2021   Memory loss 03/14/2021   Port-A-Cath in place 07/05/2020   Frequent headaches 06/27/2020   Polyarthralgia 06/15/2020   Headache 05/09/2020   Encounter for antineoplastic immunotherapy 04/18/2020   Goals of care, counseling/discussion 04/18/2020   Adenocarcinoma, metastatic ([place]) 01/07/2020   Adjustment disorder with depressed mood 01/06/2020   Thyroid nodule 01/04/2020   Non-small cell carcinoma of lung, stage 4 w/ mets to brain ([place]) 12/31/2019   Partial symptomatic epilepsy with complex partial seizures, not intractable, without status epilepticus ([facility]) 12/23/2019   Malignant neoplasm metastatic to brain ([facility]) 12/23/2019   Focal seizures ([place]) 12/09/2019   Healthcare maintenance 12/24/2018   Essential hypertension 07/28/2018   Past Medical History:  Diagnosis Date   Brain mass  Bursitis of right hip    Chest pain 08/12/2018   Chronic cough    Dependence on nicotine from cigarettes    Diabetes mellitus without complication ([facility])    Essential hypertension 07/28/2018   nscl ca dx'd 12/2019   Seizures ([facility])    Viral illness 03/23/2019    Family History  Problem Relation Age of Onset   Heart disease Mother        CABG   Kidney disease Sister    Diabetes Brother    Kidney disease Brother        KIDNEY TRANSPLANT   Hypertension Sister    Healthy Daughter    Healthy Daughter    Healthy Son     Past Surgical History:  Procedure Laterality Date   APPLICATION OF CRANIAL NAVIGATION N/A 01/07/2020   Procedure: APPLICATION OF CRANIAL NAVIGATION;  Surgeon:  Ashok Pall, MD;  Location: [facility];  Service: Neurosurgery;  Laterality: N/A;   CRANIOTOMY Right 01/07/2020   Procedure: RIGHT CRANIOTOMY FOR TUMOR RESECTION;  Surgeon: Ashok Pall, MD;  Location: [facility];  Service: Neurosurgery;  Laterality: Right;  rm 21   ENDOBRONCHIAL ULTRASOUND N/A 12/21/2019   Procedure: ENDOBRONCHIAL ULTRASOUND;  Surgeon: Laurin Coder, MD;  [facility];  Service: Pulmonary;  Laterality: N/A;   FINE NEEDLE ASPIRATION  12/21/2019   Procedure: FINE NEEDLE ASPIRATION (FNA) LINEAR;  Surgeon: Laurin Coder, MD;  [facility];  Service: Pulmonary;;   IR IMAGING GUIDED PORT INSERTION  03/03/2020   NO PAST SURGERIES     VIDEO BRONCHOSCOPY N/A 12/21/2019   Procedure: VIDEO BRONCHOSCOPY WITHOUT FLUORO;  Surgeon: Laurin Coder, MD;  [facility];  Service: Pulmonary;  Laterality: N/A;   Social History   Occupational History   Not on file  Tobacco Use   Smoking status: Former    Packs/day: 1.00    Years: 48.00    Total pack years: 48.00    Types: Cigarettes    Start date: 08/07/1970    Quit date: 12/15/2019    Years since quitting: 1.9   Smokeless tobacco: Never  Vaping Use   Vaping Use: Never used  Substance and Sexual Activity   Alcohol use: Not Currently   Drug use: Never   Sexual activity: Not on file    Comment: YES

## 2021-12-08 ENCOUNTER — Other Ambulatory Visit: Payer: Self-pay

## 2021-12-11 ENCOUNTER — Telehealth: Payer: Self-pay | Admitting: Internal Medicine

## 2021-12-11 NOTE — Telephone Encounter (Signed)
Called patient regarding upcoming August and September appointments, left a voicemail for patient's sister who handles his appointments.

## 2021-12-16 ENCOUNTER — Ambulatory Visit
Admission: RE | Admit: 2021-12-16 | Discharge: 2021-12-16 | Disposition: A | Discharge status: Home / Self Care | Payer: Medicare HMO | Source: Ambulatory Visit | Attending: Orthopaedic Surgery | Admitting: Orthopaedic Surgery

## 2021-12-16 DIAGNOSIS — M170 Bilateral primary osteoarthritis of knee: Secondary | ICD-10-CM

## 2021-12-18 NOTE — Progress Notes (Signed)
Needs appt to review MRI please.  Thanks.

## 2021-12-21 ENCOUNTER — Ambulatory Visit (INDEPENDENT_AMBULATORY_CARE_PROVIDER_SITE_OTHER): Payer: Medicare HMO | Admitting: Orthopaedic Surgery

## 2021-12-21 DIAGNOSIS — G8929 Other chronic pain: Secondary | ICD-10-CM

## 2021-12-21 DIAGNOSIS — M25561 Pain in right knee: Secondary | ICD-10-CM | POA: Diagnosis not present

## 2021-12-21 DIAGNOSIS — M25562 Pain in left knee: Secondary | ICD-10-CM | POA: Diagnosis not present

## 2021-12-21 MED ORDER — METHYLPREDNISOLONE ACETATE 40 MG/ML IJ SUSP
40.0000 mg | INTRAMUSCULAR | Status: AC | PRN
  Administered 2021-12-21: 40 mg via INTRA_ARTICULAR

## 2021-12-21 MED ORDER — BUPIVACAINE HCL 0.5 % IJ SOLN
2.0000 mL | INTRAMUSCULAR | Status: AC | PRN
  Administered 2021-12-21: 2 mL via INTRA_ARTICULAR

## 2021-12-21 MED ORDER — LIDOCAINE HCL 1 % IJ SOLN
2.0000 mL | INTRAMUSCULAR | Status: AC | PRN
  Administered 2021-12-21: 2 mL

## 2021-12-21 NOTE — Progress Notes (Signed)
Office Visit Note   Patient: Jared Shea.           Date of Birth: May 30, 1955           MRN: [id number] Visit Date: 12/21/2021              Requested by: Jared Blonder, DO [address] [place],  [place] [address] PCP: Jared Blonder, DO   Assessment & Plan: Visit Diagnoses:  1. Chronic pain of both knees     Plan: MRI shows mild chondromalacia and findings consistent with patellar maltracking.  Questionable small medial meniscal tear but not symptomatic clinically.  Symptoms more consistent with patellofemoral dysfunction.  Patient states he had no relief from the Visco injections therefore would like to try another round of cortisone injections.  I agreed to do this but this will be his third round of shots this year and I do not think it is a good idea to continue these past today's.  I strongly encouraged that he go to physical therapy.  Referral has been made.  Will see him back as needed.  Total face to face encounter time was greater than 25 minutes and over half of this time was spent in counseling and/or coordination of care.  Follow-Up Instructions: No follow-ups on file.   Orders:  Orders Placed This Encounter  Procedures   Large Joint Inj: bilateral knee   Ambulatory referral to Physical Therapy   No orders of the defined types were placed in this encounter.     Procedures: Large Joint Inj: bilateral knee on 12/21/2021 10:00 AM Indications: pain Details: 22 G needle  Arthrogram: No  Medications (Right): 2 mL lidocaine 1 %; 2 mL bupivacaine 0.5 %; 40 mg methylPREDNISolone acetate 40 MG/ML Medications (Left): 2 mL lidocaine 1 %; 2 mL bupivacaine 0.5 %; 40 mg methylPREDNISolone acetate 40 MG/ML Outcome: tolerated well, no immediate complications Patient was prepped and draped in the usual sterile fashion.       Clinical Data: No additional findings.   Subjective: Chief Complaint  Patient presents with   Right Knee - Pain   Left Knee - Pain     HPI Jared Shea is here to discuss bilateral knee MRI scans.  Review of Systems  Constitutional: Negative.   All other systems reviewed and are negative.    Objective: Vital Signs: There were no vitals taken for this visit.  Physical Exam Vitals and nursing note reviewed.  Constitutional:      Appearance: He is well-developed.  HENT:     Head: Normocephalic and atraumatic.  Eyes:     Pupils: Pupils are equal, round, and reactive to light.  Pulmonary:     Effort: Pulmonary effort is normal.  Abdominal:     Palpations: Abdomen is soft.  Musculoskeletal:        General: Normal range of motion.     Cervical back: Neck supple.  Skin:    General: Skin is warm.  Neurological:     Mental Status: He is alert and oriented to person, place, and time.  Psychiatric:        Behavior: Behavior normal.        Thought Content: Thought content normal.        Judgment: Judgment normal.     Ortho Exam Examination of bilateral knees shows no joint line tenderness.  Pain localized to the anterior aspect of the knees.  No joint effusions. Specialty Comments:  No specialty comments available.  Imaging: No  results found.   PMFS History: Patient Active Problem List   Diagnosis Date Noted   Bilateral primary osteoarthritis of knee 12/06/2021   Chills 09/08/2021   Primary osteoarthritis of right knee 08/15/2021   Primary osteoarthritis of left knee 08/15/2021   Erectile dysfunction 07/11/2021   Knee pain 07/11/2021   Dysuria 04/03/2021   Vision changes 03/14/2021   Memory loss 03/14/2021   Port-A-Cath in place 07/05/2020   Frequent headaches 06/27/2020   Polyarthralgia 06/15/2020   Headache 05/09/2020   Encounter for antineoplastic immunotherapy 04/18/2020   Goals of care, counseling/discussion 04/18/2020   Adenocarcinoma, metastatic ([place] [facility]) 01/07/2020   Adjustment disorder with depressed mood 01/06/2020   Thyroid nodule 01/04/2020   Non-small cell carcinoma of lung, stage 4  w/ mets to brain ([facility]) 12/31/2019   Partial symptomatic epilepsy with complex partial seizures, not intractable, without status epilepticus ([facility]) 12/23/2019   Malignant neoplasm metastatic to brain ([facility]) 12/23/2019   Focal seizures ([place] [facility]) 12/09/2019   Healthcare maintenance 12/24/2018   Essential hypertension 07/28/2018   Past Medical History:  Diagnosis Date   Brain mass    Bursitis of right hip    Chest pain 08/12/2018   Chronic cough    Dependence on nicotine from cigarettes    Diabetes mellitus without complication ([facility])    Essential hypertension 07/28/2018   nscl ca dx'd 12/2019   Seizures ([place])    Viral illness 03/23/2019    Family History  Problem Relation Age of Onset   Heart disease Mother        CABG   Kidney disease Sister    Diabetes Brother    Kidney disease Brother        KIDNEY TRANSPLANT   Hypertension Sister    Healthy Daughter    Healthy Daughter    Healthy Son     Past Surgical History:  Procedure Laterality Date   APPLICATION OF CRANIAL NAVIGATION N/A 01/07/2020   Procedure: APPLICATION OF CRANIAL NAVIGATION;  Surgeon: Ashok Pall, MD;  Location: [facility];  Service: Neurosurgery;  Laterality: N/A;   CRANIOTOMY Right 01/07/2020   Procedure: RIGHT CRANIOTOMY FOR TUMOR RESECTION;  Surgeon: Ashok Pall, MD;  Location: [facility];  Service: Neurosurgery;  Laterality: Right;  rm 21   ENDOBRONCHIAL ULTRASOUND N/A 12/21/2019   Procedure: ENDOBRONCHIAL ULTRASOUND;  Surgeon: Laurin Coder, MD;  [facility];  Service: Pulmonary;  Laterality: N/A;   FINE NEEDLE ASPIRATION  12/21/2019   Procedure: FINE NEEDLE ASPIRATION (FNA) LINEAR;  Surgeon: Laurin Coder, MD;  [facility];  Service: Pulmonary;;   IR IMAGING GUIDED PORT INSERTION  03/03/2020   NO PAST SURGERIES     VIDEO BRONCHOSCOPY N/A 12/21/2019   Procedure: VIDEO BRONCHOSCOPY WITHOUT FLUORO;  Surgeon: Laurin Coder, MD;  [facility];  Service: Pulmonary;  Laterality: N/A;    Social History   Occupational History   Not on file  Tobacco Use   Smoking status: Former    Packs/day: 1.00    Years: 48.00    Total pack years: 48.00    Types: Cigarettes    Start date: 08/07/1970    Quit date: 12/15/2019    Years since quitting: 2.0   Smokeless tobacco: Never  Vaping Use   Vaping Use: Never used  Substance and Sexual Activity   Alcohol use: Not Currently   Drug use: Never   Sexual activity: Not on file    Comment: YES

## 2021-12-22 ENCOUNTER — Other Ambulatory Visit: Payer: Self-pay

## 2021-12-22 ENCOUNTER — Telehealth: Payer: Self-pay | Admitting: Student

## 2021-12-22 DIAGNOSIS — C3490 Malignant neoplasm of unspecified part of unspecified bronchus or lung: Secondary | ICD-10-CM

## 2021-12-22 DIAGNOSIS — C799 Secondary malignant neoplasm of unspecified site: Secondary | ICD-10-CM

## 2021-12-22 NOTE — Telephone Encounter (Signed)
Instead of calling me back, pt came to the office. Stated he thinks Ibuprofen may be causing the burning in his chest; stated he takes it frequently. And it happens when lying down. I told pt it could be acid reflux; he agreed. Prilosec is on his current med list; stated he does not have any at home. Stated he wants to try Prilosec before scheduling an appt.

## 2021-12-22 NOTE — Telephone Encounter (Signed)
Called pt for more details - no answer and ismailbox full, unable to leave a message.

## 2021-12-22 NOTE — Telephone Encounter (Signed)
Pt calling to report when he takes his medication sometimes his chest burns.  Pt wants to know why this is happening after he takes his medications.  Pls call back.

## 2021-12-25 ENCOUNTER — Inpatient Hospital Stay: Payer: Medicare HMO

## 2021-12-25 ENCOUNTER — Other Ambulatory Visit: Payer: Medicare HMO

## 2021-12-25 ENCOUNTER — Encounter: Payer: Self-pay | Admitting: Internal Medicine

## 2021-12-25 ENCOUNTER — Inpatient Hospital Stay: Payer: Medicare HMO | Attending: Internal Medicine | Admitting: Internal Medicine

## 2021-12-25 ENCOUNTER — Other Ambulatory Visit: Payer: Self-pay

## 2021-12-25 ENCOUNTER — Other Ambulatory Visit: Payer: Self-pay | Admitting: Internal Medicine

## 2021-12-25 VITALS — BP 147/100 | HR 90 | Temp 97.6°F | Resp 16 | Ht 73.0 in | Wt 187.0 lb

## 2021-12-25 VITALS — BP 130/84 | HR 83 | Resp 18

## 2021-12-25 DIAGNOSIS — Z5112 Encounter for antineoplastic immunotherapy: Secondary | ICD-10-CM | POA: Insufficient documentation

## 2021-12-25 DIAGNOSIS — C799 Secondary malignant neoplasm of unspecified site: Secondary | ICD-10-CM | POA: Diagnosis not present

## 2021-12-25 DIAGNOSIS — Z95828 Presence of other vascular implants and grafts: Secondary | ICD-10-CM

## 2021-12-25 DIAGNOSIS — C7931 Secondary malignant neoplasm of brain: Secondary | ICD-10-CM | POA: Insufficient documentation

## 2021-12-25 DIAGNOSIS — C3490 Malignant neoplasm of unspecified part of unspecified bronchus or lung: Secondary | ICD-10-CM

## 2021-12-25 LAB — CMP (CANCER CENTER ONLY)
ALT: 24 U/L (ref 0–44)
AST: 21 U/L (ref 15–41)
Albumin: 4.3 g/dL (ref 3.5–5.0)
Alkaline Phosphatase: 77 U/L (ref 38–126)
Anion gap: 3 — ABNORMAL LOW (ref 5–15)
BUN: 14 mg/dL (ref 8–23)
CO2: 31 mmol/L (ref 22–32)
Calcium: 8.8 mg/dL — ABNORMAL LOW (ref 8.9–10.3)
Chloride: 106 mmol/L (ref 98–111)
Creatinine: 0.64 mg/dL (ref 0.61–1.24)
GFR, Estimated: >60 mL/min (ref >60)
Glucose, Bld: 208 mg/dL — ABNORMAL HIGH (ref 70–99)
Potassium: 3.2 mmol/L — ABNORMAL LOW (ref 3.5–5.1)
Sodium: 140 mmol/L (ref 135–145)
Total Bilirubin: 0.8 mg/dL (ref 0.3–1.2)
Total Protein: 7.2 g/dL (ref 6.5–8.1)

## 2021-12-25 LAB — CBC WITH DIFFERENTIAL (CANCER CENTER ONLY)
Abs Immature Granulocytes: 0.03 10*3/uL (ref 0.00–0.07)
Basophils Absolute: 0 10*3/uL (ref 0.0–0.1)
Basophils Relative: 1 %
Eosinophils Absolute: 0.1 10*3/uL (ref 0.0–0.5)
Eosinophils Relative: 2 %
HCT: 34.9 % — ABNORMAL LOW (ref 39.0–52.0)
Hemoglobin: 12.5 g/dL — ABNORMAL LOW (ref 13.0–17.0)
Immature Granulocytes: 0 %
Lymphocytes Relative: 25 %
Lymphs Abs: 1.9 10*3/uL (ref 0.7–4.0)
MCH: 30.6 pg (ref 26.0–34.0)
MCHC: 35.8 g/dL (ref 30.0–36.0)
MCV: 85.3 fL (ref 80.0–100.0)
Monocytes Absolute: 0.6 10*3/uL (ref 0.1–1.0)
Monocytes Relative: 8 %
Neutro Abs: 4.9 10*3/uL (ref 1.7–7.7)
Neutrophils Relative %: 64 %
Platelet Count: 373 10*3/uL (ref 150–400)
RBC: 4.09 MIL/uL — ABNORMAL LOW (ref 4.22–5.81)
RDW: 14.6 % (ref 11.5–15.5)
WBC Count: 7.6 10*3/uL (ref 4.0–10.5)
nRBC: 0 % (ref 0.0–0.2)

## 2021-12-25 MED ORDER — SODIUM CHLORIDE 0.9 % IV SOLN: Freq: Once | INTRAVENOUS | Status: AC

## 2021-12-25 MED ORDER — OMEPRAZOLE 20 MG PO CPDR: 20.0000 mg | DELAYED_RELEASE_CAPSULE | Freq: Every day | ORAL | 1 refills | Status: DC

## 2021-12-25 MED ORDER — SODIUM CHLORIDE 0.9 % IV SOLN
350.0000 mg | Freq: Once | INTRAVENOUS | Status: AC
  Administered 2021-12-25: 350 mg via INTRAVENOUS
  Filled 2021-12-25: qty 7

## 2021-12-25 MED ORDER — HEPARIN SOD (PORK) LOCK FLUSH 100 UNIT/ML IV SOLN
500.0000 [IU] | Freq: Once | INTRAVENOUS | Status: AC | PRN
  Administered 2021-12-25: 500 [IU]

## 2021-12-25 MED ORDER — SODIUM CHLORIDE 0.9% FLUSH
10.0000 mL | INTRAVENOUS | Status: DC | PRN
  Administered 2021-12-25: 10 mL

## 2021-12-25 MED ORDER — SODIUM CHLORIDE 0.9% FLUSH
10.0000 mL | Freq: Once | INTRAVENOUS | Status: AC
  Administered 2021-12-25: 10 mL

## 2021-12-25 MED ORDER — POTASSIUM CHLORIDE CRYS ER 20 MEQ PO TBCR: 20.0000 meq | EXTENDED_RELEASE_TABLET | Freq: Every day | ORAL | 0 refills | Status: DC

## 2021-12-25 NOTE — Progress Notes (Signed)
[facility] [phone]   [phone]  OFFICE PROGRESS NOTE  [name], DO [place] [address]  DIAGNOSIS: stage IV (TX, N2, M1 C) non-small cell lung cancer, adenocarcinoma diagnosed in August 2021 and presented with solitary brain metastasis in addition to mediastinal lymphadenopathy.  PDL1 Expression 70%   Molecular Biomarkers:  Tumor Mutational Burden - 52 Muts/Mb Microsatellite status - MS-Stable Genomic Findings For a complete list of the genes assayed, please refer to the Appendix. NF1 E1694* MTAP loss exons 2-8 RICTOR amplification ATRX A419V BRAF K483E CDKN2A/B CDKN2A loss, CDKN2B loss DNMT3A E205* FGF10 amplification NTRK1 amplification - equivocal? 7 Disease relevant genes with no reportable alterations: ALK, EGFR, ERBB2, KRAS, MET, RET, ROS1    PRIOR THERAPY:  1) Status post right craniotomy with tumor resection followed by [facility] to solitary brain metastasis under the care of Dr. Lisbeth Renshaw and Dr. Sherral Hammers. 2) Concurrent chemoradiation with weekly carboplatin for AUC of 2 and paclitaxel 45 mg/M2.  First dose 02/01/2020. Status post 5 cycles.  Last dose was given February 29, 2020.   CURRENT THERAPY:  First-line treatment with immunotherapy with Libtayo (Cempilimab) 350 mg IV every 3 weeks.  First dose April 25, 2020.  Status post 29 cycles.  INTERVAL HISTORY: Jared Shea. 66 y.o. male returns to the clinic today for follow-up visit.  The patient is feeling fine today with no concerning complaints.  The patient has no complaints today.  He recently received injection in his knees for help with the arthritis.  The patient denied having any chest pain, shortness of breath, cough or hemoptysis.  He denied having any fever or chills.  He has no nausea, vomiting, diarrhea or constipation.  He has no headache or visual changes.  He is here today for evaluation before starting cycle #30 of his treatment.  MEDICAL  HISTORY: Past Medical History:  Diagnosis Date   Brain mass    Bursitis of right hip    Chest pain 08/12/2018   Chronic cough    Dependence on nicotine from cigarettes    Diabetes mellitus without complication ([facility])    Essential hypertension 07/28/2018   nscl ca dx'd 12/2019   Seizures ([place])    Viral illness 03/23/2019    ALLERGIES:  is allergic to penicillins.  MEDICATIONS:  Current Outpatient Medications  Medication Sig Dispense Refill   albuterol (VENTOLIN HFA) 108 (90 Base) MCG/ACT inhaler Inhale 2 puffs into the lungs every 6 (six) hours as needed for wheezing or shortness of breath. (Patient not taking: Reported on 12/04/2021) 8 g 2   benzonatate (TESSALON) 100 MG capsule Take 1 capsule (100 mg total) by mouth 3 (three) times daily as needed for cough. (Patient not taking: Reported on 12/04/2021) 30 capsule 2   celecoxib (CELEBREX) 200 MG capsule Take 1 capsule (200 mg total) by mouth 2 (two) times daily. 30 capsule 3   HYDROcodone-acetaminophen (NORCO) 5-325 MG tablet Take 1 tablet by mouth every 6 (six) hours as needed for moderate pain. (Patient not taking: Reported on 12/04/2021) 30 tablet 0   ibuprofen (ADVIL) 800 MG tablet Take 800 mg by mouth 3 (three) times daily.     levETIRAcetam (KEPPRA) 750 MG tablet Take 1 tablet (750 mg total) by mouth 2 (two) times daily. 180 tablet 4   losartan (COZAAR) 25 MG tablet Take 1 tablet (25 mg total) by mouth daily. 90 tablet 3   meloxicam (MOBIC) 7.5 MG tablet Take 1 tablet (  7.5 mg total) by mouth daily. (Patient not taking: Reported on 12/04/2021) 90 tablet 0   nortriptyline (PAMELOR) 25 MG capsule TAKE 2 CAPSULES(50 MG) BY MOUTH AT BEDTIME 60 capsule 5   omeprazole (PRILOSEC) 20 MG capsule Take 1 capsule (20 mg total) by mouth daily. Pt takes in the morning 90 capsule 1   sildenafil (VIAGRA) 50 MG tablet Take 2 tablets (100 mg total) by mouth daily as needed for erectile dysfunction. (Patient not taking: Reported on 12/04/2021) 30 tablet 3    No current facility-administered medications for this visit.    SURGICAL HISTORY:  Past Surgical History:  Procedure Laterality Date   APPLICATION OF CRANIAL NAVIGATION N/A 01/07/2020   Procedure: APPLICATION OF CRANIAL NAVIGATION;  Surgeon: Ashok Pall, MD;  Location: [facility];  Service: Neurosurgery;  Laterality: N/A;   CRANIOTOMY Right 01/07/2020   Procedure: RIGHT CRANIOTOMY FOR TUMOR RESECTION;  Surgeon: Ashok Pall, MD;  Location: [facility];  Service: Neurosurgery;  Laterality: Right;  rm 21   ENDOBRONCHIAL ULTRASOUND N/A 12/21/2019   Procedure: ENDOBRONCHIAL ULTRASOUND;  Surgeon: Laurin Coder, MD;  [facility];  Service: Pulmonary;  Laterality: N/A;   FINE NEEDLE ASPIRATION  12/21/2019   Procedure: FINE NEEDLE ASPIRATION (FNA) LINEAR;  Surgeon: Laurin Coder, MD;  [facility];  Service: Pulmonary;;   IR IMAGING GUIDED PORT INSERTION  03/03/2020   NO PAST SURGERIES     VIDEO BRONCHOSCOPY N/A 12/21/2019   Procedure: VIDEO BRONCHOSCOPY WITHOUT FLUORO;  Surgeon: Laurin Coder, MD;  [facility];  Service: Pulmonary;  Laterality: N/A;    REVIEW OF SYSTEMS:  Constitutional: negative Eyes: negative Ears, nose, mouth, throat, and face: negative Respiratory: negative Cardiovascular: negative Gastrointestinal: negative Genitourinary:negative Integument/breast: negative Hematologic/lymphatic: negative Musculoskeletal:negative Neurological: negative Behavioral/Psych: negative Endocrine: negative Allergic/Immunologic: negative   PHYSICAL EXAMINATION: General appearance: alert, cooperative, appears older than stated age, and no distress Head: Normocephalic, without obvious abnormality, atraumatic Neck: no adenopathy, no JVD, supple, symmetrical, trachea midline, and thyroid not enlarged, symmetric, no tenderness/mass/nodules Lymph nodes: Cervical, supraclavicular, and axillary nodes normal. Resp: clear to auscultation bilaterally Back:  symmetric, no curvature. ROM normal. No CVA tenderness. Cardio: regular rate and rhythm, S1, S2 normal, no murmur, click, rub or gallop GI: soft, non-tender; bowel sounds normal; no masses,  no organomegaly Extremities: extremities normal, atraumatic, no cyanosis or edema Neurologic: Alert and oriented X 3, normal strength and tone. Normal symmetric reflexes. Normal coordination and gait  ECOG PERFORMANCE STATUS: 1 - Symptomatic but completely ambulatory  Blood pressure (!) 147/100, pulse 90, temperature 97.6 F (36.4 C), temperature source Oral, resp. rate 16, weight 187 lb (84.8 kg), SpO2 99 %.  LABORATORY DATA: Lab Results  Component Value Date   WBC 7.6 12/25/2021   HGB 12.5 (L) 12/25/2021   HCT 34.9 (L) 12/25/2021   MCV 85.3 12/25/2021   PLT 373 12/25/2021      Chemistry      Component Value Date/Time   NA 141 12/04/2021 0846   NA 140 11/23/2019 1516   K 3.3 (L) 12/04/2021 0846   CL 108 12/04/2021 0846   CO2 26 12/04/2021 0846   BUN 14 12/04/2021 0846   BUN 14 11/23/2019 1516   CREATININE 0.67 12/04/2021 0846      Component Value Date/Time   CALCIUM 8.9 12/04/2021 0846   ALKPHOS 80 12/04/2021 0846   AST 20 12/04/2021 0846   ALT 19 12/04/2021 0846   BILITOT 0.8 12/04/2021 0846       RADIOGRAPHIC  STUDIES: MR Knee Left w/o contrast  Result Date: 12/18/2021 CLINICAL DATA:  Bilateral knee pain for 6 months EXAM: MRI OF THE LEFT KNEE WITHOUT CONTRAST TECHNIQUE: Multiplanar, multisequence MR imaging of the knee was performed. No intravenous contrast was administered. COMPARISON:  None Available. FINDINGS: MENISCI Medial: Possible small tear of the superior surface of the posterior horn-body junction of the medial meniscus (image 11/series 8). Lateral: Tiny radial tear of the free edge of the body of the lateral meniscus. Otherwise no discrete tear. LIGAMENTS Cruciates: ACL and PCL are intact. Collaterals: Medial collateral ligament is intact. Lateral collateral ligament  complex is intact. CARTILAGE Patellofemoral: Partial-thickness cartilage loss of the lateral patellar facet. Medial:  No focal chondral defect. Lateral:  No focal chondral defect. JOINT: No joint effusion. Edema in superolateral Hoffa's fat. No plical thickening. POPLITEAL FOSSA: Popliteus tendon is intact. No Baker's cyst. EXTENSOR MECHANISM: Intact quadriceps tendon. Intact patellar tendon. Intact lateral patellar retinaculum. Intact medial patellar retinaculum. Intact MPFL. BONES: No aggressive osseous lesion. No fracture or dislocation. Other: No fluid collection or hematoma. Muscles are normal. IMPRESSION: 1. Possible small tear of the superior surface of the posterior horn-body junction of the medial meniscus. 2. Tiny radial tear of the free edge of the body of the lateral meniscus. 3. Partial-thickness cartilage loss of the lateral patellar facet. 4. Edema in superolateral Hoffa's fat as can be seen with patellar tendon-lateral femoral condyle friction syndrome. Electronically Signed   By: Kathreen Devoid M.D.   On: 12/18/2021 11:50   MR Knee Right w/o contrast  Result Date: 12/18/2021 CLINICAL DATA:  Bilateral knee pain for 6 months. EXAM: MRI OF THE RIGHT KNEE WITHOUT CONTRAST TECHNIQUE: Multiplanar, multisequence MR imaging of the knee was performed. No intravenous contrast was administered. COMPARISON:  None Available. FINDINGS: MENISCI Medial: Oblique tear of the posterior horn of the medial meniscus extending to the inferior articular surface. Lateral: Intact. LIGAMENTS Cruciates: ACL and PCL are intact. Collaterals: Medial collateral ligament is intact. Lateral collateral ligament complex is intact. CARTILAGE Patellofemoral:  No chondral defect. Medial:  Mild chondral thinning. Lateral:  No chondral defect. JOINT: No joint effusion. Edema in superolateral Hoffa's fat. No plical thickening. POPLITEAL FOSSA: Popliteus tendon is intact. No Baker's cyst. EXTENSOR MECHANISM: Intact quadriceps tendon. Intact  patellar tendon. Intact lateral patellar retinaculum. Intact medial patellar retinaculum. Intact MPFL. BONES: No aggressive osseous lesion. No fracture or dislocation. Other: No fluid collection or hematoma. Muscles are normal. IMPRESSION: 1. Oblique tear of the posterior horn of the medial meniscus extending to the inferior articular surface. 2. Edema in superolateral Hoffa's fat as can be seen with patellar tendon-lateral femoral condyle friction syndrome. Electronically Signed   By: Kathreen Devoid M.D.   On: 12/18/2021 11:42     ASSESSMENT AND PLAN: This is a very pleasant 66 years old African-American male recently diagnosed with a stage IV (TX, N2, M1c) non-small cell lung cancer, adenocarcinoma presented with solitary brain metastasis in addition to right hilar and mediastinal lymphadenopathy diagnosed in August 2021.  The patient is status post right craniotomy with resection of the solitary brain metastasis with SRS. His PET scan showed no evidence of metastatic disease outside the chest. The patient was treated with a course of concurrent chemoradiation with weekly carboplatin and paclitaxel status post 5 cycles.  He tolerated his treatment well except for dysphagia and odynophagia. His PD-L1 expression is 70%. The patient has been on treatment with immunotherapy with Libtayo (Cempilimab) 350 Mg IV every 3 weeks status post  29 cycles.  He has been tolerating this treatment well with no concerning adverse effect except for arthralgia.  He was recently treated with a steroid injection to his knees and he is feeling much better. I recommended for him to proceed with cycle #30 of his treatment today as planned. For the arthralgia he was found on recent MRI of the right knee to have oblique tear of the posterior horn of the medial meniscus extending to the inferior articular surface and he received a steroid injection to that area.  He is feeling much better. I will see him back for follow-up visit in 3  weeks for evaluation with repeat CT scan of the chest, abdomen and pelvis for restaging of his disease. The patient was advised to call immediately if he has any other concerning symptoms in the interval. The patient voices understanding of current disease status and treatment options and is in agreement with the current care plan.  All questions were answered. The patient knows to call the clinic with any problems, questions or concerns. We can certainly see the patient much sooner if necessary.  The total time spent in the appointment was 30 minutes.  Disclaimer: This note was dictated with voice recognition software. Similar sounding words can inadvertently be transcribed and may not be corrected upon review.

## 2021-12-25 NOTE — Progress Notes (Signed)
Nutrition Assessment   Reason for Assessment:  Patient identified on Malnutrition Screening report for weight loss   ASSESSMENT:  66 year old male with stage IV non small cell lung cancer with brain mets.    Met with patient during infusion.  Patient eating bag of chips during visit. Says that his appetite is good.  Denies trouble swallowing food, nausea or any nutrition impact symptoms.  Says that he usually drinks an ensure in the morning and sometimes has cake with it.  May not eat much for lunch if has a late breakfast.  Supper maybe spaghetti or something like that.    Medications: keppra, omeprazole   Labs: reviewed   Anthropometrics:   Height: 73 inches Weight: 187 lb 201 lb on 07/11/21 BMI: 24  7% weight loss in the 5 1/2 months  NUTRITION DIAGNOSIS:  Inadequate oral intake related to cancer as evidenced by 7% weight loss in the last 5 1/2 months   INTERVENTION:  Discussed importance of nutrition and weight maintenance during treatment.  Encouraged increasing oral nutrition supplement to BID Encouraged to focus on good sources of protein.  Examples provided    MONITORING, EVALUATION, GOAL: weight trends, intake   Next Visit: Tuesday, Sept 5 during infusion  Jared Shea B. Zenia Resides, [place], [place] Registered Dietitian [phone]

## 2021-12-29 ENCOUNTER — Other Ambulatory Visit: Payer: Self-pay | Admitting: Neurology

## 2021-12-29 ENCOUNTER — Telehealth: Payer: Self-pay

## 2021-12-29 NOTE — Telephone Encounter (Signed)
Pt called wanting to know if he has an appointment with the CC on Monday.  I have reviewed pts future appts, and advised pt he has an ORTHO appt at 9:30am. Pt expressed understanding of this information.

## 2022-01-05 NOTE — Therapy (Signed)
OUTPATIENT PHYSICAL THERAPY LOWER EXTREMITY EVALUATION   Patient Name: Jared Shea. MRN: [id number] DOB:1955/12/30, 66 y.o., male Today's Date: 01/08/2022   PT End of Session - 01/08/22 0914     Visit Number 1    Number of Visits 13    Date for PT Re-Evaluation 02/24/22    Authorization Type [employer]    PT Start Time 0932    PT Stop Time 1013    PT Time Calculation (min) 41 min    Activity Tolerance Patient tolerated treatment well    Behavior During Therapy [facility] for tasks assessed/performed             Past Medical History:  Diagnosis Date   Brain mass    Bursitis of right hip    Chest pain 08/12/2018   Chronic cough    Dependence on nicotine from cigarettes    Diabetes mellitus without complication ([place])    Essential hypertension 07/28/2018   nscl ca dx'd 12/2019   Seizures ([place])    Viral illness 03/23/2019   Past Surgical History:  Procedure Laterality Date   APPLICATION OF CRANIAL NAVIGATION N/A 01/07/2020   Procedure: APPLICATION OF CRANIAL NAVIGATION;  Surgeon: Ashok Pall, MD;  Location: [place];  Service: Neurosurgery;  Laterality: N/A;   CRANIOTOMY Right 01/07/2020   Procedure: RIGHT CRANIOTOMY FOR TUMOR RESECTION;  Surgeon: Ashok Pall, MD;  Location: [place];  Service: Neurosurgery;  Laterality: Right;  rm 21   ENDOBRONCHIAL ULTRASOUND N/A 12/21/2019   Procedure: ENDOBRONCHIAL ULTRASOUND;  Surgeon: Laurin Coder, MD;  Location: [facility];  Service: Pulmonary;  Laterality: N/A;   FINE NEEDLE ASPIRATION  12/21/2019   Procedure: FINE NEEDLE ASPIRATION (FNA) LINEAR;  Surgeon: Laurin Coder, MD;  Location: [facility];  Service: Pulmonary;;   IR IMAGING GUIDED PORT INSERTION  03/03/2020   NO PAST SURGERIES     VIDEO BRONCHOSCOPY N/A 12/21/2019   Procedure: VIDEO BRONCHOSCOPY WITHOUT FLUORO;  Surgeon: Laurin Coder, MD;  Location: [facility];  Service: Pulmonary;  Laterality: N/A;   Patient Active Problem List   Diagnosis Date Noted   Bilateral  primary osteoarthritis of knee 12/06/2021   Chills 09/08/2021   Primary osteoarthritis of right knee 08/15/2021   Primary osteoarthritis of left knee 08/15/2021   Erectile dysfunction 07/11/2021   Knee pain 07/11/2021   Dysuria 04/03/2021   Vision changes 03/14/2021   Memory loss 03/14/2021   Port-A-Cath in place 07/05/2020   Frequent headaches 06/27/2020   Polyarthralgia 06/15/2020   Headache 05/09/2020   Encounter for antineoplastic immunotherapy 04/18/2020   Goals of care, counseling/discussion 04/18/2020   Adenocarcinoma, metastatic ([place]) 01/07/2020   Adjustment disorder with depressed mood 01/06/2020   Thyroid nodule 01/04/2020   Non-small cell carcinoma of lung, stage 4 w/ mets to brain ([place]) 12/31/2019   Partial symptomatic epilepsy with complex partial seizures, not intractable, without status epilepticus ([place]) 12/23/2019   Malignant neoplasm metastatic to brain ([place]) 12/23/2019   Focal seizures ([place]) 12/09/2019   Healthcare maintenance 12/24/2018   Essential hypertension 07/28/2018    PCP: [name], DO  REFERRING PROVIDER: Leandrew Koyanagi, MD  REFERRING DIAG: M25.561,M25.562,G89.29 (ICD-10-CM) - Chronic pain of both knees  THERAPY DIAG:  Chronic pain of left knee  Chronic pain of right knee  Muscle weakness (generalized)  Rationale for Evaluation and Treatment Rehabilitation  ONSET DATE: chronic   SUBJECTIVE:   SUBJECTIVE STATEMENT: Patient reports chronic bilateral knee pain that began about 6-7 months ago that he attributes to arthritis. He  recently received a cortisone injection in bilateral knees, but feels like it's wearing off. He denies any numbness/tingling. He reports occasional popping in the Lt knee when he goes to stand. He denies any feelings of instability. He is currently being treated for lung cancer.   PERTINENT HISTORY: Stage IV non-small cell lung cancer, adenocarcinoma with solitary brain metastasis in addition to right hilar and  mediastinal lymphadenopathy diagnosed in August 2021 currently being treated.  Seizures   PAIN:  Are you having pain? Yes: NPRS scale: 8/10 Pain location: bilateral knees  Pain description: deep ache Aggravating factors: when transition from sitting to standing Relieving factors: cortisone injection  PRECAUTIONS: Other: active cancer  WEIGHT BEARING RESTRICTIONS No  FALLS:  Has patient fallen in last 6 months? No  LIVING ENVIRONMENT: Lives with: lives with their family Lives in: House/apartment Stairs: No Has following equipment at home: None  OCCUPATION: retired   PLOF: Independent  PATIENT GOALS "get back on track, where my knees don't hurt."    OBJECTIVE:   DIAGNOSTIC FINDINGS:  Lt knee MRI IMPRESSION: 1. Possible small tear of the superior surface of the posterior horn-body junction of the medial meniscus. 2. Tiny radial tear of the free edge of the body of the lateral meniscus. 3. Partial-thickness cartilage loss of the lateral patellar facet. 4. Edema in superolateral Hoffa's fat as can be seen with patellar tendon-lateral femoral condyle friction syndrome.  Rt knee MRI: IMPRESSION: 1. Oblique tear of the posterior horn of the medial meniscus extending to the inferior articular surface. 2. Edema in superolateral Hoffa's fat as can be seen with patellar tendon-lateral femoral condyle friction syndrome.  PATIENT SURVEYS:  FOTO 51% function to 71% predicted   COGNITION:  Overall cognitive status: Within functional limits for tasks assessed     SENSATION: WFL  EDEMA:  No obvious deformity.   MUSCLE LENGTH: Hamstrings: Right lacking 40 deg; Left lacking 35 deg   POSTURE: rounded shoulders and forward head  PALPATION: TTP bilateral inferior pole of patella, fat pad, medial femoral condyle   LOWER EXTREMITY ROM:  Active ROM Right eval Left eval  Hip flexion    Hip extension    Hip abduction    Hip adduction    Hip internal rotation    Hip  external rotation    Knee flexion 100 pn 109 pn  Knee extension WNL WNL  Ankle dorsiflexion Lacking 2 Lacking 5  Ankle plantarflexion    Ankle inversion    Ankle eversion     (Blank rows = not tested)  LOWER EXTREMITY MMT:  MMT Right eval Left eval  Hip flexion 4- 4-  Hip extension 4- 4-  Hip abduction 4- 3+  Hip adduction    Hip internal rotation    Hip external rotation    Knee flexion 4+ 4+  Knee extension 4 pn 4 pn  Ankle dorsiflexion    Ankle plantarflexion    Ankle inversion    Ankle eversion     (Blank rows = not tested)  LOWER EXTREMITY SPECIAL TESTS:  Valgus stress (- bilateral)  Varus stress (- bilateral)  Anterior drawer (- bilateral)  Posterior drawer (- bilateral)  Clarke's sign (+ bilateral)  McMurray's (remains guarded)  Ely's (+ bilateral)     FUNCTIONAL TESTS:  5 times sit to stand: 24 seconds  GAIT: Distance walked: 15 ft  Assistive device utilized: None Level of assistance: Complete Independence Comments: excessive pronation LLE, lateral trunk lean, WBOS    TODAY'S TREATMENT: [facility] Adult  PT Treatment:                                                DATE: 01/08/22 Therapeutic Exercise: Demonstrated and issue initial HEP.   Therapeutic Activity: Education on assessment findings that will be addressed throughout duration of POC.      PATIENT EDUCATION:  Education details: see treatment  Person educated: Patient Education method: Explanation, Demonstration, Tactile cues, Verbal cues, and Handouts Education comprehension: verbalized understanding, returned demonstration, verbal cues required, tactile cues required, and needs further education   HOME EXERCISE PROGRAM: Access Code: [email or web address] URL: [email or web address] Date: 01/08/2022 Prepared by: Gwendolyn Grant  Exercises - Seated Hamstring Stretch  - 2 x daily - 7 x weekly - 3 sets - 30 sec  hold - Gastroc Stretch on Wall  - 2 x daily - 7 x weekly - 3 sets - 30 sec   hold - Modified Thomas Stretch  - 2 x daily - 7 x weekly - 3 sets - 30 sec  hold  ASSESSMENT:  CLINICAL IMPRESSION: Patient is a 66 y.o. male who was seen today for physical therapy evaluation and treatment for chronic bilateral knee pain. His recent imaging shows meniscal tears, though patient has no joint line tenderness or feelings of instability about the knee, however unable to accurately assess meniscal special tests as patient is unable to relax through the movement. He has palpable tenderness about the inferior pole of bilateral patella and the fat pad. Upon assessment he is noted to have significant tightness about hamstrings, quadriceps, and gastroc bilaterally as well as weakness about bilateral hips and knees and gait abnormalities. He will benefit from skilled PT to address the above stated deficits in order to optimize his function and assist in overall pain reduction.    OBJECTIVE IMPAIRMENTS Abnormal gait, decreased activity tolerance, decreased endurance, decreased knowledge of condition, difficulty walking, decreased ROM, decreased strength, impaired flexibility, improper body mechanics, postural dysfunction, and pain.   ACTIVITY LIMITATIONS carrying, lifting, bending, sitting, standing, squatting, stairs, and locomotion level  PARTICIPATION LIMITATIONS: cleaning, shopping, community activity, and yard work  PERSONAL FACTORS Age, Fitness, Time since onset of injury/illness/exacerbation, and 3+ comorbidities: active cancer undergoing treatment, diabetes, seizures  are also affecting patient's functional outcome.   REHAB POTENTIAL: Good  CLINICAL DECISION MAKING: Stable/uncomplicated  EVALUATION COMPLEXITY: Low   GOALS: Goals reviewed with patient? Yes  SHORT TERM GOALS: Target date: 01/29/22 Patient will be independent and compliant with initial HEP.   Baseline: issued at eval  Goal status: INITIAL  2.  Patient will improve bilateral hamstring length by at least 10  degrees and bilateral ankle DF by at least 5 degrees to reduce stress about the knees with closed chain activity.  Baseline: see above  Goal status: INITIAL  3.  Patient will demonstrate at least 115 degrees of bilateral knee flexion AROM to improve ability to complete sit<>stand transfer.  Baseline: see above Goal status: INITIAL   LONG TERM GOALS: Target date:02/19/22  Patient will demonstrate 5/5 bilateral knee strength to improve ability to complete stair and curb negotiation.  Baseline: see above Goal status: INITIAL  2.  Patient will demonstrate at least 4+/5 bilateral hip strength to improve stability about the chain with prolonged walking and standing activity.  Baseline: see above  Goal status: INITIAL  3.  Patient will complete 5 x STS  in </= 18 seconds to improve his ease of transfers.  Baseline: see above  Goal status: INITIAL  4.  Patient will report pain at worst rated as </= 6/10 to signify improvements in his current condition.  Baseline: 10/10 at worst  Goal status: INITIAL   PLAN: PT FREQUENCY: 2x/week  PT DURATION: 6 weeks  PLANNED INTERVENTIONS: Therapeutic exercises, Therapeutic activity, Neuromuscular re-education, Balance training, Gait training, Patient/Family education, Self Care, Joint mobilization, Stair training, Dry Needling, Cryotherapy, Taping, Manual therapy, and Re-evaluation  PLAN FOR NEXT SESSION: review and progress HEP prn; hip and knee stretching/strengthening progressing to [facility] as tolerated   Gwendolyn Grant, PT, DPT, ATC 01/08/22 11:30 AM  Referring diagnosis? Chronic pain of both knees Treatment diagnosis? (if different than referring diagnosis)   Chronic pain of left knee  Chronic pain of right knee  Muscle weakness (generalized)  What was this (referring dx) caused by? []  Surgery []  Fall [x]  Ongoing issue []  Arthritis []  Other: ____________  Laterality: []  Rt []  Lt [x]  Both  Check all possible CPT codes:  *CHOOSE 10  OR LESS*    []  97110 (Therapeutic Exercise)  []  44818 (SLP Treatment)  []  97112 (Neuro Re-ed)   []  92526 (Swallowing Treatment)   []  97116 (Gait Training)   []  [id number] (Cognitive Training, 1st 15 minutes) []  97140 (Manual Therapy)   []  97130 (Cognitive Training, each add'l 15 minutes)  []  97164 (Re-evaluation)                              []  Other, List CPT Code ____________  []  97530 (Therapeutic Activities)     []  97535 (Self Care)   [x]  All codes above (97110 - 97535)  []  97012 (Mechanical Traction)  []  97014 (E-stim Unattended)  []  97032 (E-stim manual)  []  97033 (Ionto)  []  97035 (Ultrasound) []  97750 (Physical Performance Training) []  [id number] (Aquatic Therapy) []  97016 (Vasopneumatic Device) []  [id number] (Paraffin) []  97034 (Contrast Bath) []  97597 (Wound Care 1st 20 sq cm) []  97598 (Wound Care each add'l 20 sq cm) []  97760 (Orthotic Fabrication, Fitting, Training Initial) []  [id number] (Prosthetic Management and Training Initial) []  [id number] (Orthotic or Prosthetic Training/ Modification Subsequent)

## 2022-01-08 ENCOUNTER — Ambulatory Visit: Payer: Medicare HMO | Attending: Orthopaedic Surgery

## 2022-01-08 DIAGNOSIS — M25562 Pain in left knee: Secondary | ICD-10-CM | POA: Diagnosis present

## 2022-01-08 DIAGNOSIS — M6281 Muscle weakness (generalized): Secondary | ICD-10-CM | POA: Diagnosis present

## 2022-01-08 DIAGNOSIS — M25561 Pain in right knee: Secondary | ICD-10-CM | POA: Diagnosis present

## 2022-01-08 DIAGNOSIS — G8929 Other chronic pain: Secondary | ICD-10-CM | POA: Insufficient documentation

## 2022-01-11 ENCOUNTER — Ambulatory Visit (HOSPITAL_COMMUNITY)
Admission: RE | Admit: 2022-01-11 | Discharge: 2022-01-11 | Disposition: A | Discharge status: Home / Self Care | Payer: Medicare HMO | Source: Ambulatory Visit | Attending: Internal Medicine | Admitting: Internal Medicine

## 2022-01-11 ENCOUNTER — Telehealth: Payer: Self-pay | Admitting: Internal Medicine

## 2022-01-11 ENCOUNTER — Encounter (HOSPITAL_COMMUNITY): Payer: Self-pay

## 2022-01-11 DIAGNOSIS — C3490 Malignant neoplasm of unspecified part of unspecified bronchus or lung: Secondary | ICD-10-CM | POA: Diagnosis present

## 2022-01-11 MED ORDER — IOHEXOL 300 MG/ML  SOLN
100.0000 mL | Freq: Once | INTRAMUSCULAR | Status: AC | PRN
  Administered 2022-01-11: 100 mL via INTRAVENOUS

## 2022-01-11 MED ORDER — SODIUM CHLORIDE (PF) 0.9 % IJ SOLN
INTRAMUSCULAR | Status: AC
  Filled 2022-01-11: qty 50

## 2022-01-11 MED ORDER — HEPARIN SOD (PORK) LOCK FLUSH 100 UNIT/ML IV SOLN: 500.0000 [IU] | Freq: Once | INTRAVENOUS | Status: AC

## 2022-01-11 MED ORDER — HEPARIN SOD (PORK) LOCK FLUSH 100 UNIT/ML IV SOLN
INTRAVENOUS | Status: AC
  Administered 2022-01-11: 500 [IU] via INTRAVENOUS
  Filled 2022-01-11: qty 5

## 2022-01-11 NOTE — Telephone Encounter (Signed)
Called patient regarding upcoming September appointments, patient is notified.

## 2022-01-16 ENCOUNTER — Inpatient Hospital Stay (HOSPITAL_BASED_OUTPATIENT_CLINIC_OR_DEPARTMENT_OTHER): Payer: Medicare HMO | Admitting: Internal Medicine

## 2022-01-16 ENCOUNTER — Inpatient Hospital Stay: Payer: Medicare HMO | Admitting: Nutrition

## 2022-01-16 ENCOUNTER — Inpatient Hospital Stay: Payer: Medicare HMO

## 2022-01-16 ENCOUNTER — Inpatient Hospital Stay: Payer: Medicare HMO | Attending: Internal Medicine

## 2022-01-16 ENCOUNTER — Encounter: Payer: Self-pay | Admitting: Internal Medicine

## 2022-01-16 ENCOUNTER — Other Ambulatory Visit: Payer: Self-pay

## 2022-01-16 VITALS — BP 150/82 | HR 77 | Resp 18

## 2022-01-16 DIAGNOSIS — Z791 Long term (current) use of non-steroidal anti-inflammatories (NSAID): Secondary | ICD-10-CM | POA: Diagnosis not present

## 2022-01-16 DIAGNOSIS — Z79899 Other long term (current) drug therapy: Secondary | ICD-10-CM | POA: Diagnosis not present

## 2022-01-16 DIAGNOSIS — I10 Essential (primary) hypertension: Secondary | ICD-10-CM | POA: Insufficient documentation

## 2022-01-16 DIAGNOSIS — C7931 Secondary malignant neoplasm of brain: Secondary | ICD-10-CM | POA: Diagnosis not present

## 2022-01-16 DIAGNOSIS — J432 Centrilobular emphysema: Secondary | ICD-10-CM | POA: Diagnosis not present

## 2022-01-16 DIAGNOSIS — I2510 Atherosclerotic heart disease of native coronary artery without angina pectoris: Secondary | ICD-10-CM | POA: Diagnosis not present

## 2022-01-16 DIAGNOSIS — I700 Atherosclerosis of aorta: Secondary | ICD-10-CM | POA: Insufficient documentation

## 2022-01-16 DIAGNOSIS — E119 Type 2 diabetes mellitus without complications: Secondary | ICD-10-CM | POA: Insufficient documentation

## 2022-01-16 DIAGNOSIS — C799 Secondary malignant neoplasm of unspecified site: Secondary | ICD-10-CM

## 2022-01-16 DIAGNOSIS — M25561 Pain in right knee: Secondary | ICD-10-CM | POA: Diagnosis not present

## 2022-01-16 DIAGNOSIS — N400 Enlarged prostate without lower urinary tract symptoms: Secondary | ICD-10-CM | POA: Diagnosis not present

## 2022-01-16 DIAGNOSIS — Z5112 Encounter for antineoplastic immunotherapy: Secondary | ICD-10-CM | POA: Insufficient documentation

## 2022-01-16 DIAGNOSIS — K5730 Diverticulosis of large intestine without perforation or abscess without bleeding: Secondary | ICD-10-CM | POA: Insufficient documentation

## 2022-01-16 DIAGNOSIS — M25562 Pain in left knee: Secondary | ICD-10-CM | POA: Insufficient documentation

## 2022-01-16 DIAGNOSIS — C3490 Malignant neoplasm of unspecified part of unspecified bronchus or lung: Secondary | ICD-10-CM | POA: Diagnosis not present

## 2022-01-16 DIAGNOSIS — Z95828 Presence of other vascular implants and grafts: Secondary | ICD-10-CM

## 2022-01-16 LAB — CBC WITH DIFFERENTIAL (CANCER CENTER ONLY)
Abs Immature Granulocytes: 0.01 10*3/uL (ref 0.00–0.07)
Basophils Absolute: 0 10*3/uL (ref 0.0–0.1)
Basophils Relative: 1 %
Eosinophils Absolute: 0.1 10*3/uL (ref 0.0–0.5)
Eosinophils Relative: 1 %
HCT: 34.4 % — ABNORMAL LOW (ref 39.0–52.0)
Hemoglobin: 12.3 g/dL — ABNORMAL LOW (ref 13.0–17.0)
Immature Granulocytes: 0 %
Lymphocytes Relative: 25 %
Lymphs Abs: 1.5 10*3/uL (ref 0.7–4.0)
MCH: 30.2 pg (ref 26.0–34.0)
MCHC: 35.8 g/dL (ref 30.0–36.0)
MCV: 84.5 fL (ref 80.0–100.0)
Monocytes Absolute: 0.5 10*3/uL (ref 0.1–1.0)
Monocytes Relative: 8 %
Neutro Abs: 3.9 10*3/uL (ref 1.7–7.7)
Neutrophils Relative %: 65 %
Platelet Count: 284 10*3/uL (ref 150–400)
RBC: 4.07 MIL/uL — ABNORMAL LOW (ref 4.22–5.81)
RDW: 13.8 % (ref 11.5–15.5)
WBC Count: 6 10*3/uL (ref 4.0–10.5)
nRBC: 0 % (ref 0.0–0.2)

## 2022-01-16 LAB — CMP (CANCER CENTER ONLY)
ALT: 12 U/L (ref 0–44)
AST: 13 U/L — ABNORMAL LOW (ref 15–41)
Albumin: 4.3 g/dL (ref 3.5–5.0)
Alkaline Phosphatase: 78 U/L (ref 38–126)
Anion gap: 4 — ABNORMAL LOW (ref 5–15)
BUN: 12 mg/dL (ref 8–23)
CO2: 27 mmol/L (ref 22–32)
Calcium: 9 mg/dL (ref 8.9–10.3)
Chloride: 109 mmol/L (ref 98–111)
Creatinine: 0.71 mg/dL (ref 0.61–1.24)
GFR, Estimated: >60 mL/min (ref >60)
Glucose, Bld: 182 mg/dL — ABNORMAL HIGH (ref 70–99)
Potassium: 3.3 mmol/L — ABNORMAL LOW (ref 3.5–5.1)
Sodium: 140 mmol/L (ref 135–145)
Total Bilirubin: 0.8 mg/dL (ref 0.3–1.2)
Total Protein: 7.2 g/dL (ref 6.5–8.1)

## 2022-01-16 LAB — TSH: TSH: 1.936 u[IU]/mL (ref 0.350–4.500)

## 2022-01-16 MED ORDER — SODIUM CHLORIDE 0.9% FLUSH
10.0000 mL | INTRAVENOUS | Status: DC | PRN
  Administered 2022-01-16: 10 mL

## 2022-01-16 MED ORDER — SODIUM CHLORIDE 0.9 % IV SOLN
350.0000 mg | Freq: Once | INTRAVENOUS | Status: AC
  Administered 2022-01-16: 350 mg via INTRAVENOUS
  Filled 2022-01-16: qty 7

## 2022-01-16 MED ORDER — SODIUM CHLORIDE 0.9% FLUSH
10.0000 mL | Freq: Once | INTRAVENOUS | Status: AC
  Administered 2022-01-16: 10 mL

## 2022-01-16 MED ORDER — HEPARIN SOD (PORK) LOCK FLUSH 100 UNIT/ML IV SOLN
500.0000 [IU] | Freq: Once | INTRAVENOUS | Status: AC | PRN
  Administered 2022-01-16: 500 [IU]

## 2022-01-16 MED ORDER — SODIUM CHLORIDE 0.9 % IV SOLN: Freq: Once | INTRAVENOUS | Status: AC

## 2022-01-16 NOTE — Progress Notes (Signed)
Nutrition follow-up completed with patient during infusion for stage IV non-small cell lung cancer with brain metastases.  Patient's weight is stable at 187 pounds September 5. Patient reports he is eating well. He denies nutrition impact symptoms. Occasionally will drink an oral nutrition supplement. He has no questions or concerns.  Nutrition diagnosis: Inadequate oral intake improved.  Intervention: Encourage patient to continue strategies for adequate calories and protein to maintain weight during treatment. Encourage patient to contact RD with further questions or concerns.  Monitoring, evaluation, goals: We will continue to monitor weight trends and follow-up as needed.  **Disclaimer: This note was dictated with voice recognition software. Similar sounding words can inadvertently be transcribed and this note may contain transcription errors which may not have been corrected upon publication of note.**

## 2022-01-16 NOTE — Progress Notes (Signed)
[facility] [phone]   [phone]  OFFICE PROGRESS NOTE  [name], DO [place] [address]  DIAGNOSIS: stage IV (TX, N2, M1 C) non-small cell lung cancer, adenocarcinoma diagnosed in August 2021 and presented with solitary brain metastasis in addition to mediastinal lymphadenopathy.  PDL1 Expression 70%   Molecular Biomarkers:  Tumor Mutational Burden - 52 Muts/Mb Microsatellite status - MS-Stable Genomic Findings For a complete list of the genes assayed, please refer to the Appendix. NF1 E1694* MTAP loss exons 2-8 RICTOR amplification ATRX A419V BRAF K483E CDKN2A/B CDKN2A loss, CDKN2B loss DNMT3A E205* FGF10 amplification NTRK1 amplification - equivocal? 7 Disease relevant genes with no reportable alterations: ALK, EGFR, ERBB2, KRAS, MET, RET, ROS1    PRIOR THERAPY:  1) Status post right craniotomy with tumor resection followed by [facility] to solitary brain metastasis under the care of Dr. Lisbeth Renshaw and Dr. Sherral Hammers. 2) Concurrent chemoradiation with weekly carboplatin for AUC of 2 and paclitaxel 45 mg/M2.  First dose 02/01/2020. Status post 5 cycles.  Last dose was given February 29, 2020.   CURRENT THERAPY:  First-line treatment with immunotherapy with Libtayo (Cempilimab) 350 mg IV every 3 weeks.  First dose April 25, 2020.  Status post 31 cycles.  INTERVAL HISTORY: Jared Shea. 66 y.o. male returns to the clinic today for follow-up visit.  The patient is feeling fine today with no concerning complaints.  The arthralgia in his knees are better.  He denied having any current chest pain, shortness of breath, cough or hemoptysis.  He has no nausea, vomiting, diarrhea or constipation.  He denied having any headache or visual changes.  He has no recent weight loss or night sweats.  He continues to tolerate his treatment with Libtayo (Cempilimab) fairly well.  The patient is here today for evaluation with repeat CT scan of the  chest, abdomen and pelvis for restaging of his disease before starting cycle #32.  MEDICAL HISTORY: Past Medical History:  Diagnosis Date   Brain mass    Bursitis of right hip    Chest pain 08/12/2018   Chronic cough    Dependence on nicotine from cigarettes    Diabetes mellitus without complication ([place])    Essential hypertension 07/28/2018   nscl ca dx'd 12/2019   Seizures ([place])    Viral illness 03/23/2019    ALLERGIES:  is allergic to penicillins.  MEDICATIONS:  Current Outpatient Medications  Medication Sig Dispense Refill   albuterol (VENTOLIN HFA) 108 (90 Base) MCG/ACT inhaler Inhale 2 puffs into the lungs every 6 (six) hours as needed for wheezing or shortness of breath. 8 g 2   celecoxib (CELEBREX) 200 MG capsule Take 1 capsule (200 mg total) by mouth 2 (two) times daily. 30 capsule 3   ibuprofen (ADVIL) 800 MG tablet Take 800 mg by mouth 3 (three) times daily.     levETIRAcetam (KEPPRA) 750 MG tablet Take 1 tablet (750 mg total) by mouth 2 (two) times daily. 180 tablet 4   losartan (COZAAR) 25 MG tablet Take 1 tablet (25 mg total) by mouth daily. 90 tablet 3   meloxicam (MOBIC) 7.5 MG tablet Take 1 tablet (7.5 mg total) by mouth daily. 90 tablet 0   nortriptyline (PAMELOR) 25 MG capsule TAKE 2 CAPSULES(50 MG) BY MOUTH AT BEDTIME 60 capsule 5   sildenafil (VIAGRA) 50 MG tablet Take 2 tablets (100 mg total) by mouth daily as needed for erectile dysfunction. 30 tablet 3  benzonatate (TESSALON) 100 MG capsule Take 1 capsule (100 mg total) by mouth 3 (three) times daily as needed for cough. (Patient not taking: Reported on 12/04/2021) 30 capsule 2   HYDROcodone-acetaminophen (NORCO) 5-325 MG tablet Take 1 tablet by mouth every 6 (six) hours as needed for moderate pain. (Patient not taking: Reported on 01/16/2022) 30 tablet 0   omeprazole (PRILOSEC) 20 MG capsule Take 1 capsule (20 mg total) by mouth daily. Pt takes in the morning (Patient not taking: Reported on 01/16/2022) 90 capsule 1    potassium chloride SA (KLOR-CON M) 20 MEQ tablet Take 1 tablet (20 mEq total) by mouth daily. (Patient not taking: Reported on 01/16/2022) 7 tablet 0   No current facility-administered medications for this visit.    SURGICAL HISTORY:  Past Surgical History:  Procedure Laterality Date   APPLICATION OF CRANIAL NAVIGATION N/A 01/07/2020   Procedure: APPLICATION OF CRANIAL NAVIGATION;  Surgeon: Ashok Pall, MD;  Location: [facility];  Service: Neurosurgery;  Laterality: N/A;   CRANIOTOMY Right 01/07/2020   Procedure: RIGHT CRANIOTOMY FOR TUMOR RESECTION;  Surgeon: Ashok Pall, MD;  Location: [facility];  Service: Neurosurgery;  Laterality: Right;  rm 21   ENDOBRONCHIAL ULTRASOUND N/A 12/21/2019   Procedure: ENDOBRONCHIAL ULTRASOUND;  Surgeon: Laurin Coder, MD;  Location: [facility];  Service: Pulmonary;  Laterality: N/A;   FINE NEEDLE ASPIRATION  12/21/2019   Procedure: FINE NEEDLE ASPIRATION (FNA) LINEAR;  Surgeon: Laurin Coder, MD;  Location: [facility];  Service: Pulmonary;;   IR IMAGING GUIDED PORT INSERTION  03/03/2020   NO PAST SURGERIES     VIDEO BRONCHOSCOPY N/A 12/21/2019   Procedure: VIDEO BRONCHOSCOPY WITHOUT FLUORO;  Surgeon: Laurin Coder, MD;  Location: [facility];  Service: Pulmonary;  Laterality: N/A;    REVIEW OF SYSTEMS:  Constitutional: negative Eyes: negative Ears, nose, mouth, throat, and face: negative Respiratory: negative Cardiovascular: negative Gastrointestinal: negative Genitourinary:negative Integument/breast: negative Hematologic/lymphatic: negative Musculoskeletal:positive for arthralgias Neurological: negative Behavioral/Psych: negative Endocrine: negative Allergic/Immunologic: negative   PHYSICAL EXAMINATION: General appearance: alert, cooperative, appears older than stated age, and no distress Head: Normocephalic, without obvious abnormality, atraumatic Neck: no adenopathy, no JVD, supple, symmetrical, trachea midline, and thyroid not  enlarged, symmetric, no tenderness/mass/nodules Lymph nodes: Cervical, supraclavicular, and axillary nodes normal. Resp: clear to auscultation bilaterally Back: symmetric, no curvature. ROM normal. No CVA tenderness. Cardio: regular rate and rhythm, S1, S2 normal, no murmur, click, rub or gallop GI: soft, non-tender; bowel sounds normal; no masses,  no organomegaly Extremities: extremities normal, atraumatic, no cyanosis or edema Neurologic: Alert and oriented X 3, normal strength and tone. Normal symmetric reflexes. Normal coordination and gait  ECOG PERFORMANCE STATUS: 1 - Symptomatic but completely ambulatory  Blood pressure (!) 166/83, pulse 81, temperature (!) 97.4 F (36.3 C), temperature source Oral, resp. rate 15, weight 187 lb (84.8 kg), SpO2 99 %.  LABORATORY DATA: Lab Results  Component Value Date   WBC 7.6 12/25/2021   HGB 12.5 (L) 12/25/2021   HCT 34.9 (L) 12/25/2021   MCV 85.3 12/25/2021   PLT 373 12/25/2021      Chemistry      Component Value Date/Time   NA 140 12/25/2021 0931   NA 140 11/23/2019 1516   K 3.2 (L) 12/25/2021 0931   CL 106 12/25/2021 0931   CO2 31 12/25/2021 0931   BUN 14 12/25/2021 0931   BUN 14 11/23/2019 1516   CREATININE 0.64 12/25/2021 0931      Component Value Date/Time   CALCIUM  8.8 (L) 12/25/2021 0931   ALKPHOS 77 12/25/2021 0931   AST 21 12/25/2021 0931   ALT 24 12/25/2021 0931   BILITOT 0.8 12/25/2021 0931       RADIOGRAPHIC STUDIES: CT Chest W Contrast  Result Date: 01/11/2022 CLINICAL DATA:  Primary Cancer Type: Lung Imaging Indication: Assess response to therapy Interval therapy since last imaging? Yes Initial Cancer Diagnosis Date: 12/21/2019; Established by: Biopsy-proven Detailed Pathology: Stage IV non-small cell lung cancer, adenocarcinoma. Primary Tumor location:  Left upper lobe. Surgeries: Right craniotomy for tumor resection. No thoracic. Chemotherapy: Yes; Ongoing? No; Most recent administration: 02/29/2020  Immunotherapy?  Yes; Type: Cempilimab; Ongoing? Yes Radiation therapy? Yes Date Range: 01/25/2020 - 03/14/2020; Target: Left lung Date Range: 04/22/2020; Target: Brain * Tracking Code: BO * EXAM: CT CHEST, ABDOMEN, AND PELVIS WITH CONTRAST TECHNIQUE: Multidetector CT imaging of the chest, abdomen and pelvis was performed following the standard protocol during bolus administration of intravenous contrast. RADIATION DOSE REDUCTION: This exam was performed according to the departmental dose-optimization program which includes automated exposure control, adjustment of the mA and/or kV according to patient size and/or use of iterative reconstruction technique. CONTRAST:  172m OMNIPAQUE IOHEXOL 300 MG/ML SOLN additional oral enteric contrast COMPARISON:  Most recent CT chest, abdomen and pelvis 11/07/2021. 01/01/2020 PET-CT. FINDINGS: CT CHEST FINDINGS Cardiovascular: Right chest port catheter. Normal heart size. Three-vessel coronary artery calcifications. No pericardial effusion. Mediastinum/Nodes: No enlarged mediastinal, hilar, or axillary lymph nodes. Thyroid gland, trachea, and esophagus demonstrate no significant findings. Lungs/Pleura: Mild, predominantly centrilobular emphysema. Diffuse bilateral bronchial wall thickening. Significant interval reduction in size of a spiculated opacity of the posterior right upper lobe abutting and tenting the major fissure measuring 0.8 x 0.6 cm, previously 1.2 x 1.1 cm (series 6, image 72). Dependent bibasilar scarring and or atelectasis, with new, somewhat nodular appearing foci of consolidation in the dependent medial right lower lobe, measuring up to 1.3 x 1.0 cm (series 6, image 116) and inferiorly in the deep medial costophrenic recess measuring 1.8 x 1.0 cm (series 6, image 135). Bandlike scarring of the medial segment right middle lobe and lingula. No pleural effusion or pneumothorax. Musculoskeletal: No chest wall abnormality. No acute osseous findings. CT ABDOMEN  PELVIS FINDINGS Hepatobiliary: No solid liver abnormality is seen. No gallstones, gallbladder wall thickening, or biliary dilatation. Pancreas: Unremarkable. No pancreatic ductal dilatation or surrounding inflammatory changes. Spleen: Normal in size without significant abnormality. Adrenals/Urinary Tract: Adrenal glands are unremarkable. Kidneys are normal, without renal calculi, solid lesion, or hydronephrosis. Bladder is unremarkable. Stomach/Bowel: Stomach is within normal limits. Appendix appears normal. No evidence of bowel wall thickening, distention, or inflammatory changes. Sigmoid diverticula. Vascular/Lymphatic: Aortic atherosclerosis. No enlarged abdominal or pelvic lymph nodes. Reproductive: Mild prostatomegaly. Other: No abdominal wall hernia or abnormality. No ascites. Musculoskeletal: No acute osseous findings. IMPRESSION: 1. Significant interval reduction in size of a spiculated opacity of the posterior right upper lobe abutting and tenting the major fissure. There are however new nodular appearing foci of consolidation in the dependent right lower lobe. Fluctuating, rapidly evolving findings primarily suggest ongoing nonspecific infection or aspiration. Attention on follow-up. 2. No evidence of lymphadenopathy or metastatic disease within the abdomen or pelvis. 3. Emphysema and diffuse bilateral bronchial wall thickening. 4. Coronary artery disease. Aortic Atherosclerosis (ICD10-I70.0) and Emphysema (ICD10-J43.9). Electronically Signed   By: ADelanna AhmadiM.D.   On: 01/11/2022 10:38   CT Abdomen Pelvis W Contrast  Result Date: 01/11/2022 CLINICAL DATA:  Primary Cancer Type: Lung Imaging Indication: Assess response to  therapy Interval therapy since last imaging? Yes Initial Cancer Diagnosis Date: 12/21/2019; Established by: Biopsy-proven Detailed Pathology: Stage IV non-small cell lung cancer, adenocarcinoma. Primary Tumor location:  Left upper lobe. Surgeries: Right craniotomy for tumor  resection. No thoracic. Chemotherapy: Yes; Ongoing? No; Most recent administration: 02/29/2020 Immunotherapy?  Yes; Type: Cempilimab; Ongoing? Yes Radiation therapy? Yes Date Range: 01/25/2020 - 03/14/2020; Target: Left lung Date Range: 04/22/2020; Target: Brain * Tracking Code: BO * EXAM: CT CHEST, ABDOMEN, AND PELVIS WITH CONTRAST TECHNIQUE: Multidetector CT imaging of the chest, abdomen and pelvis was performed following the standard protocol during bolus administration of intravenous contrast. RADIATION DOSE REDUCTION: This exam was performed according to the departmental dose-optimization program which includes automated exposure control, adjustment of the mA and/or kV according to patient size and/or use of iterative reconstruction technique. CONTRAST:  117m OMNIPAQUE IOHEXOL 300 MG/ML SOLN additional oral enteric contrast COMPARISON:  Most recent CT chest, abdomen and pelvis 11/07/2021. 01/01/2020 PET-CT. FINDINGS: CT CHEST FINDINGS Cardiovascular: Right chest port catheter. Normal heart size. Three-vessel coronary artery calcifications. No pericardial effusion. Mediastinum/Nodes: No enlarged mediastinal, hilar, or axillary lymph nodes. Thyroid gland, trachea, and esophagus demonstrate no significant findings. Lungs/Pleura: Mild, predominantly centrilobular emphysema. Diffuse bilateral bronchial wall thickening. Significant interval reduction in size of a spiculated opacity of the posterior right upper lobe abutting and tenting the major fissure measuring 0.8 x 0.6 cm, previously 1.2 x 1.1 cm (series 6, image 72). Dependent bibasilar scarring and or atelectasis, with new, somewhat nodular appearing foci of consolidation in the dependent medial right lower lobe, measuring up to 1.3 x 1.0 cm (series 6, image 116) and inferiorly in the deep medial costophrenic recess measuring 1.8 x 1.0 cm (series 6, image 135). Bandlike scarring of the medial segment right middle lobe and lingula. No pleural effusion or  pneumothorax. Musculoskeletal: No chest wall abnormality. No acute osseous findings. CT ABDOMEN PELVIS FINDINGS Hepatobiliary: No solid liver abnormality is seen. No gallstones, gallbladder wall thickening, or biliary dilatation. Pancreas: Unremarkable. No pancreatic ductal dilatation or surrounding inflammatory changes. Spleen: Normal in size without significant abnormality. Adrenals/Urinary Tract: Adrenal glands are unremarkable. Kidneys are normal, without renal calculi, solid lesion, or hydronephrosis. Bladder is unremarkable. Stomach/Bowel: Stomach is within normal limits. Appendix appears normal. No evidence of bowel wall thickening, distention, or inflammatory changes. Sigmoid diverticula. Vascular/Lymphatic: Aortic atherosclerosis. No enlarged abdominal or pelvic lymph nodes. Reproductive: Mild prostatomegaly. Other: No abdominal wall hernia or abnormality. No ascites. Musculoskeletal: No acute osseous findings. IMPRESSION: 1. Significant interval reduction in size of a spiculated opacity of the posterior right upper lobe abutting and tenting the major fissure. There are however new nodular appearing foci of consolidation in the dependent right lower lobe. Fluctuating, rapidly evolving findings primarily suggest ongoing nonspecific infection or aspiration. Attention on follow-up. 2. No evidence of lymphadenopathy or metastatic disease within the abdomen or pelvis. 3. Emphysema and diffuse bilateral bronchial wall thickening. 4. Coronary artery disease. Aortic Atherosclerosis (ICD10-I70.0) and Emphysema (ICD10-J43.9). Electronically Signed   By: ADelanna AhmadiM.D.   On: 01/11/2022 10:38     ASSESSMENT AND PLAN: This is a very pleasant 66years old African-American male recently diagnosed with a stage IV (TX, N2, M1c) non-small cell lung cancer, adenocarcinoma presented with solitary brain metastasis in addition to right hilar and mediastinal lymphadenopathy diagnosed in August 2021.  The patient is status  post right craniotomy with resection of the solitary brain metastasis with SRS. His PET scan showed no evidence of metastatic disease outside the chest. The  patient was treated with a course of concurrent chemoradiation with weekly carboplatin and paclitaxel status post 5 cycles.  He tolerated his treatment well except for dysphagia and odynophagia. His PD-L1 expression is 70%. The patient has been on treatment with immunotherapy with Libtayo (Cempilimab) 350 Mg IV every 3 weeks status post 30 cycles.  He tolerated the last cycle of his treatment well with no concerning adverse effects. He had repeat CT scan of the chest, abdomen and pelvis performed recently.  I personally and independently reviewed the scans and discussed the result with the patient today. His scan showed interval reduction in the size of the spiculated opacity of the posterior right upper lobe but there was new nodular appearing foci of consolidation and this is still suspicious for nonspecific infection or aspiration. I recommended for the patient to proceed with cycle #30 one of his treatment with Libtayo (Cempilimab) today as planned. I will see him back for follow-up visit in 3 weeks for evaluation before the next cycle of his treatment. For the arthralgia, he is followed by orthopedic surgery. For the hypertension I strongly recommend for the patient to take his blood pressure medication and to monitor it closely at home. He was advised to call immediately if he has any other concerning symptoms in the interval.  The patient voices understanding of current disease status and treatment options and is in agreement with the current care plan.  All questions were answered. The patient knows to call the clinic with any problems, questions or concerns. We can certainly see the patient much sooner if necessary.  The total time spent in the appointment was 30 minutes.  Disclaimer: This note was dictated with voice recognition  software. Similar sounding words can inadvertently be transcribed and may not be corrected upon review.

## 2022-01-16 NOTE — Patient Instructions (Signed)
[facility]   Discharge Instructions: Thank you for choosing [facility] to provide your oncology and hematology care.   If you have a lab appointment with the [facility], please go directly to the [facility] and check in at the registration area.   Wear comfortable clothing and clothing appropriate for easy access to any Portacath or PICC line.   We strive to give you quality time with your provider. You may need to reschedule your appointment if you arrive late (15 or more minutes).  Arriving late affects you and other patients whose appointments are after yours.  Also, if you miss three or more appointments without notifying the office, you may be dismissed from the clinic at the provider's discretion.      For prescription refill requests, have your pharmacy contact our office and allow 72 hours for refills to be completed.    Today you received the following chemotherapy and/or immunotherapy agents: Cemiplimab (Libtayo)       To help prevent nausea and vomiting after your treatment, we encourage you to take your nausea medication as directed.  BELOW ARE SYMPTOMS THAT SHOULD BE REPORTED IMMEDIATELY: *FEVER GREATER THAN 100.4 F (38 C) OR HIGHER *CHILLS OR SWEATING *NAUSEA AND VOMITING THAT IS NOT CONTROLLED WITH YOUR NAUSEA MEDICATION *UNUSUAL SHORTNESS OF BREATH *UNUSUAL BRUISING OR BLEEDING *URINARY PROBLEMS (pain or burning when urinating, or frequent urination) *BOWEL PROBLEMS (unusual diarrhea, constipation, pain near the anus) TENDERNESS IN MOUTH AND THROAT WITH OR WITHOUT PRESENCE OF ULCERS (sore throat, sores in mouth, or a toothache) UNUSUAL RASH, SWELLING OR PAIN  UNUSUAL VAGINAL DISCHARGE OR ITCHING   Items with * indicate a potential emergency and should be followed up as soon as possible or go to the Emergency Department if any problems should occur.  Please show the CHEMOTHERAPY ALERT CARD or IMMUNOTHERAPY ALERT CARD  at check-in to the Emergency Department and triage nurse.  Should you have questions after your visit or need to cancel or reschedule your appointment, please contact [facility]  Dept: [phone]  and follow the prompts.  Office hours are 8:00 a.m. to 4:30 p.m. Monday - Friday. Please note that voicemails left after 4:00 p.m. may not be returned until the following business day.  We are closed weekends and major holidays. You have access to a nurse at all times for urgent questions. Please call the main number to the clinic Dept: [phone] and follow the prompts.   For any non-urgent questions, you may also contact your provider using MyChart. We now offer e-Visits for anyone 29 and older to request care online for non-urgent symptoms. For details visit [email or web address].   Also download the MyChart app! Go to the app store, search "MyChart", open the app, select [facility], and log in with your MyChart username and password.  Masks are optional in the cancer centers. If you would like for your care team to wear a mask while they are taking care of you, please let them know. You may have one support person who is at least 68 years old accompany you for your appointments.

## 2022-01-16 NOTE — Therapy (Signed)
OUTPATIENT PHYSICAL THERAPY TREATMENT NOTE   Patient Name: Jared Shea. MRN: [id number] DOB:07/04/1955, 66 y.o., male Today's Date: 01/17/2022  PCP: [name], DO   REFERRING PROVIDER: Leandrew Koyanagi, MD   END OF SESSION:   PT End of Session - 01/17/22 1203     Visit Number 2    Number of Visits 13    Date for PT Re-Evaluation 02/24/22    Authorization Type [employer]    Authorization Time Period 01/17/2022 - 03/03/2022    Authorization - Visit Number 1    Authorization - Number of Visits 12    PT Start Time 1150    PT Stop Time 1230    PT Time Calculation (min) 40 min    Activity Tolerance Patient tolerated treatment well    Behavior During Therapy [facility] for tasks assessed/performed             Past Medical History:  Diagnosis Date   Brain mass    Bursitis of right hip    Chest pain 08/12/2018   Chronic cough    Dependence on nicotine from cigarettes    Diabetes mellitus without complication ([place])    Essential hypertension 07/28/2018   nscl ca dx'd 12/2019   Seizures ([place])    Viral illness 03/23/2019   Past Surgical History:  Procedure Laterality Date   APPLICATION OF CRANIAL NAVIGATION N/A 01/07/2020   Procedure: APPLICATION OF CRANIAL NAVIGATION;  Surgeon: Ashok Pall, MD;  Location: [place];  Service: Neurosurgery;  Laterality: N/A;   CRANIOTOMY Right 01/07/2020   Procedure: RIGHT CRANIOTOMY FOR TUMOR RESECTION;  Surgeon: Ashok Pall, MD;  Location: [place];  Service: Neurosurgery;  Laterality: Right;  rm 21   ENDOBRONCHIAL ULTRASOUND N/A 12/21/2019   Procedure: ENDOBRONCHIAL ULTRASOUND;  Surgeon: Laurin Coder, MD;  [facility];  Service: Pulmonary;  Laterality: N/A;   FINE NEEDLE ASPIRATION  12/21/2019   Procedure: FINE NEEDLE ASPIRATION (FNA) LINEAR;  Surgeon: Laurin Coder, MD;  [facility];  Service: Pulmonary;;   IR IMAGING GUIDED PORT INSERTION  03/03/2020   NO PAST SURGERIES     VIDEO BRONCHOSCOPY N/A 12/21/2019   Procedure:  VIDEO BRONCHOSCOPY WITHOUT FLUORO;  Surgeon: Laurin Coder, MD;  [facility];  Service: Pulmonary;  Laterality: N/A;   Patient Active Problem List   Diagnosis Date Noted   Bilateral primary osteoarthritis of knee 12/06/2021   Chills 09/08/2021   Primary osteoarthritis of right knee 08/15/2021   Primary osteoarthritis of left knee 08/15/2021   Erectile dysfunction 07/11/2021   Knee pain 07/11/2021   Dysuria 04/03/2021   Vision changes 03/14/2021   Memory loss 03/14/2021   Port-A-Cath in place 07/05/2020   Frequent headaches 06/27/2020   Polyarthralgia 06/15/2020   Headache 05/09/2020   Encounter for antineoplastic immunotherapy 04/18/2020   Goals of care, counseling/discussion 04/18/2020   Adenocarcinoma, metastatic ([place]) 01/07/2020   Adjustment disorder with depressed mood 01/06/2020   Thyroid nodule 01/04/2020   Non-small cell carcinoma of lung, stage 4 w/ mets to brain ([place]) 12/31/2019   Partial symptomatic epilepsy with complex partial seizures, not intractable, without status epilepticus ([place]) 12/23/2019   Malignant neoplasm metastatic to brain ([place]) 12/23/2019   Focal seizures ([place]) 12/09/2019   Healthcare maintenance 12/24/2018   Essential hypertension 07/28/2018    REFERRING DIAG: Chronic pain of both knees  THERAPY DIAG:  Chronic pain of left knee  Chronic pain of right knee  Muscle weakness (generalized)  Rationale for Evaluation and Treatment Rehabilitation  PERTINENT HISTORY: Stage IV non-small cell lung cancer, adenocarcinoma with solitary brain metastasis in addition to right hilar and mediastinal lymphadenopathy diagnosed in August 2021 currently being treated. Seizures   PRECAUTIONS: Active cancer   SUBJECTIVE: Patient reports that he could barely walk after the exercises last visit.  PAIN:  Are you having pain? Yes: NPRS scale: 8/10 Pain location: bilateral knees  Pain description: deep ache Aggravating factors: when transition from  sitting to standing Relieving factors: cortisone injection  PATIENT GOALS "get back on track, where my knees don't hurt."    OBJECTIVE: (objective measures completed at initial evaluation unless otherwise dated) PATIENT SURVEYS:  FOTO 51% function to 71% predicted    MUSCLE LENGTH: Hamstrings: Right lacking 40 deg; Left lacking 35 deg   POSTURE: rounded shoulders and forward head   PALPATION: TTP bilateral inferior pole of patella, fat pad, medial femoral condyle    LOWER EXTREMITY ROM:   Active ROM Right eval Left eval  Hip flexion      Hip extension      Hip abduction      Hip adduction      Hip internal rotation      Hip external rotation      Knee flexion 100 pn 109 pn  Knee extension WNL WNL  Ankle dorsiflexion Lacking 2 Lacking 5  Ankle plantarflexion      Ankle inversion      Ankle eversion       (Blank rows = not tested)   LOWER EXTREMITY MMT:   MMT Right eval Left eval  Hip flexion 4- 4-  Hip extension 4- 4-  Hip abduction 4- 3+  Hip adduction      Hip internal rotation      Hip external rotation      Knee flexion 4+ 4+  Knee extension 4 pn 4 pn  Ankle dorsiflexion      Ankle plantarflexion      Ankle inversion      Ankle eversion       (Blank rows = not tested)   LOWER EXTREMITY SPECIAL TESTS:  Valgus stress (- bilateral)  Varus stress (- bilateral)  Anterior drawer (- bilateral)  Posterior drawer (- bilateral)  Clarke's sign (+ bilateral)  McMurray's (remains guarded)  Ely's (+ bilateral)    FUNCTIONAL TESTS:  5 times sit to stand: 24 seconds   GAIT: Distance walked: 15 ft  Assistive device utilized: None Level of assistance: Complete Independence Comments: excessive pronation LLE, lateral trunk lean, WBOS       TODAY'S TREATMENT: [facility] Adult PT Treatment:                                                DATE: 01/17/2022 Therapeutic Exercise: NuStep L6 x 5 min with UE/LE while taking subjective Slant board calf stretch 3 x 30  sec Seated hamstring stretch 2 x 30 sec each Modified thomas stretch 2 x 30 sec each SLR 2 x 5 partial range Side clamshell 2 x 10 each Sit to stand without UE support 2 x 10 from elevated table Standing heel raises 2 x 10    PATIENT EDUCATION:  Education details: HEP Person educated: Patient Education method: Explanation, Demonstration, Corporate treasurer cues, Verbal cues Education comprehension: verbalized understanding, returned demonstration, verbal cues required, tactile cues required, and needs further education   HOME EXERCISE PROGRAM:  Access Code: [email or web address]    ASSESSMENT: CLINICAL IMPRESSION: Patient tolerated therapy well with no adverse effects. Therapy focused on continued stretching and initiating strengthening exercises. Patient did not report any increased pain with exercises in session but states that he would likely not be able to walk tomorrow due to pain following exercises. He was encouraged to work on his stretches at home from previous session, no changes made to current HEP. He does require consistent cueing for proper exercise technique. Patient would benefit from continued skilled PT to progress his mobility and strength in order to reduce pain and maximize functional ability.     OBJECTIVE IMPAIRMENTS Abnormal gait, decreased activity tolerance, decreased endurance, decreased knowledge of condition, difficulty walking, decreased ROM, decreased strength, impaired flexibility, improper body mechanics, postural dysfunction, and pain.    ACTIVITY LIMITATIONS carrying, lifting, bending, sitting, standing, squatting, stairs, and locomotion level   PARTICIPATION LIMITATIONS: cleaning, shopping, community activity, and yard work   PERSONAL FACTORS Age, Fitness, Time since onset of injury/illness/exacerbation, and 3+ comorbidities: active cancer undergoing treatment, diabetes, seizures  are also affecting patient's functional outcome.      GOALS: Goals reviewed with patient?  Yes   SHORT TERM GOALS: Target date: 01/29/22 Patient will be independent and compliant with initial HEP.    Baseline: issued at eval  Goal status: INITIAL   2.  Patient will improve bilateral hamstring length by at least 10 degrees and bilateral ankle DF by at least 5 degrees to reduce stress about the knees with closed chain activity.  Baseline: see above  Goal status: INITIAL   3.  Patient will demonstrate at least 115 degrees of bilateral knee flexion AROM to improve ability to complete sit<>stand transfer.  Baseline: see above Goal status: INITIAL     LONG TERM GOALS: Target date:02/19/22   Patient will demonstrate 5/5 bilateral knee strength to improve ability to complete stair and curb negotiation.  Baseline: see above Goal status: INITIAL   2.  Patient will demonstrate at least 4+/5 bilateral hip strength to improve stability about the chain with prolonged walking and standing activity.  Baseline: see above  Goal status: INITIAL   3.  Patient will complete 5 x STS in </= 18 seconds to improve his ease of transfers.  Baseline: see above  Goal status: INITIAL   4.  Patient will report pain at worst rated as </= 6/10 to signify improvements in his current condition.  Baseline: 10/10 at worst  Goal status: INITIAL     PLAN: PT FREQUENCY: 2x/week   PT DURATION: 6 weeks   PLANNED INTERVENTIONS: Therapeutic exercises, Therapeutic activity, Neuromuscular re-education, Balance training, Gait training, Patient/Family education, Self Care, Joint mobilization, Stair training, Dry Needling, Cryotherapy, Taping, Manual therapy, and Re-evaluation   PLAN FOR NEXT SESSION: review and progress HEP prn; hip and knee stretching/strengthening progressing to CKC as tolerated     Hilda Blades, PT, DPT, LAT, ATC 01/17/22  1:56 PM Phone: [phone] Fax: [phone]

## 2022-01-17 ENCOUNTER — Other Ambulatory Visit: Payer: Self-pay

## 2022-01-17 ENCOUNTER — Ambulatory Visit: Payer: Medicare HMO | Attending: Orthopaedic Surgery | Admitting: Physical Therapy

## 2022-01-17 ENCOUNTER — Encounter: Payer: Self-pay | Admitting: Physical Therapy

## 2022-01-17 DIAGNOSIS — M6281 Muscle weakness (generalized): Secondary | ICD-10-CM | POA: Diagnosis present

## 2022-01-17 DIAGNOSIS — G8929 Other chronic pain: Secondary | ICD-10-CM | POA: Insufficient documentation

## 2022-01-17 DIAGNOSIS — M25562 Pain in left knee: Secondary | ICD-10-CM | POA: Diagnosis present

## 2022-01-17 DIAGNOSIS — M25561 Pain in right knee: Secondary | ICD-10-CM | POA: Insufficient documentation

## 2022-01-17 LAB — T4: T4, Total: 11.8 ug/dL (ref 4.5–12.0)

## 2022-01-19 ENCOUNTER — Ambulatory Visit: Payer: Medicare HMO | Admitting: Physical Therapy

## 2022-01-22 ENCOUNTER — Ambulatory Visit: Payer: Medicare HMO

## 2022-01-22 DIAGNOSIS — G8929 Other chronic pain: Secondary | ICD-10-CM

## 2022-01-22 DIAGNOSIS — M25562 Pain in left knee: Secondary | ICD-10-CM | POA: Diagnosis not present

## 2022-01-22 DIAGNOSIS — M6281 Muscle weakness (generalized): Secondary | ICD-10-CM

## 2022-01-22 NOTE — Therapy (Signed)
OUTPATIENT PHYSICAL THERAPY TREATMENT NOTE   Patient Name: Jared Shea. MRN: [id number] DOB:June 16, 1955, 66 y.o., male Today's Date: 01/22/2022  PCP: [name], DO   REFERRING PROVIDER: Leandrew Koyanagi, MD   END OF SESSION:   PT End of Session - 01/22/22 1027     Visit Number 3    Number of Visits 13    Date for PT Re-Evaluation 02/24/22    Authorization Type [employer]    Authorization Time Period 01/17/2022 - 03/03/2022    Authorization - Visit Number 2    Authorization - Number of Visits 12    PT Start Time 1027    PT Stop Time 1110    PT Time Calculation (min) 43 min    Activity Tolerance Patient tolerated treatment well    Behavior During Therapy [facility] for tasks assessed/performed              Past Medical History:  Diagnosis Date   Brain mass    Bursitis of right hip    Chest pain 08/12/2018   Chronic cough    Dependence on nicotine from cigarettes    Diabetes mellitus without complication ([place])    Essential hypertension 07/28/2018   nscl ca dx'd 12/2019   Seizures ([place])    Viral illness 03/23/2019   Past Surgical History:  Procedure Laterality Date   APPLICATION OF CRANIAL NAVIGATION N/A 01/07/2020   Procedure: APPLICATION OF CRANIAL NAVIGATION;  Surgeon: Ashok Pall, MD;  Location: [place];  Service: Neurosurgery;  Laterality: N/A;   CRANIOTOMY Right 01/07/2020   Procedure: RIGHT CRANIOTOMY FOR TUMOR RESECTION;  Surgeon: Ashok Pall, MD;  Location: [place];  Service: Neurosurgery;  Laterality: Right;  rm 21   ENDOBRONCHIAL ULTRASOUND N/A 12/21/2019   Procedure: ENDOBRONCHIAL ULTRASOUND;  Surgeon: Laurin Coder, MD;  [facility];  Service: Pulmonary;  Laterality: N/A;   FINE NEEDLE ASPIRATION  12/21/2019   Procedure: FINE NEEDLE ASPIRATION (FNA) LINEAR;  Surgeon: Laurin Coder, MD;  [facility];  Service: Pulmonary;;   IR IMAGING GUIDED PORT INSERTION  03/03/2020   NO PAST SURGERIES     VIDEO BRONCHOSCOPY N/A 12/21/2019   Procedure:  VIDEO BRONCHOSCOPY WITHOUT FLUORO;  Surgeon: Laurin Coder, MD;  [facility];  Service: Pulmonary;  Laterality: N/A;   Patient Active Problem List   Diagnosis Date Noted   Bilateral primary osteoarthritis of knee 12/06/2021   Chills 09/08/2021   Primary osteoarthritis of right knee 08/15/2021   Primary osteoarthritis of left knee 08/15/2021   Erectile dysfunction 07/11/2021   Knee pain 07/11/2021   Dysuria 04/03/2021   Vision changes 03/14/2021   Memory loss 03/14/2021   Port-A-Cath in place 07/05/2020   Frequent headaches 06/27/2020   Polyarthralgia 06/15/2020   Headache 05/09/2020   Encounter for antineoplastic immunotherapy 04/18/2020   Goals of care, counseling/discussion 04/18/2020   Adenocarcinoma, metastatic ([place]) 01/07/2020   Adjustment disorder with depressed mood 01/06/2020   Thyroid nodule 01/04/2020   Non-small cell carcinoma of lung, stage 4 w/ mets to brain ([place]) 12/31/2019   Partial symptomatic epilepsy with complex partial seizures, not intractable, without status epilepticus ([place]) 12/23/2019   Malignant neoplasm metastatic to brain ([place]) 12/23/2019   Focal seizures ([place]) 12/09/2019   Healthcare maintenance 12/24/2018   Essential hypertension 07/28/2018    REFERRING DIAG: Chronic pain of both knees  THERAPY DIAG:  Chronic pain of left knee  Chronic pain of right knee  Muscle weakness (generalized)  Rationale for Evaluation and Treatment Rehabilitation  PERTINENT HISTORY: Stage IV non-small cell lung cancer, adenocarcinoma with solitary brain metastasis in addition to right hilar and mediastinal lymphadenopathy diagnosed in August 2021 currently being treated. Seizures   PRECAUTIONS: Active cancer   SUBJECTIVE:  Patient reports his knees are still feeling the same. "I do my exercises when I'm not hurting."  PAIN:  Are you having pain? Yes: NPRS scale: 9/10 Pain location: bilateral knees  Pain description: sore  Aggravating factors:  when transition from sitting to standing Relieving factors: cortisone injection  PATIENT GOALS "get back on track, where my knees don't hurt."    OBJECTIVE: (objective measures completed at initial evaluation unless otherwise dated) PATIENT SURVEYS:  FOTO 51% function to 71% predicted    MUSCLE LENGTH: Hamstrings: Right lacking 40 deg; Left lacking 35 deg  01/22/22:Hamstring: Rt lacking 40 deg; Lt lacking 35 deg    POSTURE: rounded shoulders and forward head   PALPATION: TTP bilateral inferior pole of patella, fat pad, medial femoral condyle    LOWER EXTREMITY ROM:   Active ROM Right eval Left eval  Hip flexion      Hip extension      Hip abduction      Hip adduction      Hip internal rotation      Hip external rotation      Knee flexion 100 pn 109 pn  Knee extension WNL WNL  Ankle dorsiflexion Lacking 2 Lacking 5  Ankle plantarflexion      Ankle inversion      Ankle eversion       (Blank rows = not tested)   LOWER EXTREMITY MMT:   MMT Right eval Left eval  Hip flexion 4- 4-  Hip extension 4- 4-  Hip abduction 4- 3+  Hip adduction      Hip internal rotation      Hip external rotation      Knee flexion 4+ 4+  Knee extension 4 pn 4 pn  Ankle dorsiflexion      Ankle plantarflexion      Ankle inversion      Ankle eversion       (Blank rows = not tested)   LOWER EXTREMITY SPECIAL TESTS:  Valgus stress (- bilateral)  Varus stress (- bilateral)  Anterior drawer (- bilateral)  Posterior drawer (- bilateral)  Clarke's sign (+ bilateral)  McMurray's (remains guarded)  Ely's (+ bilateral)    FUNCTIONAL TESTS:  5 times sit to stand: 24 seconds   GAIT: Distance walked: 15 ft  Assistive device utilized: None Level of assistance: Complete Independence Comments: excessive pronation LLE, lateral trunk lean, WBOS       TODAY'S TREATMENT: [facility] Adult PT Treatment:                                                DATE: 01/22/22 Therapeutic Exercise: NuStep  level 6 x 5 minutes UE/LE Calf stretch on wedge x 60 sec  Prone quad stretch x 60 sec each  HS stretch with strap 2 x 30 sec each  Hip bridge 2 x 10  Partial range SLR 2 x 6  Hooklying resisted hip abduction blue band 2 x 10  Updated HEP  [facility] Adult PT Treatment:  DATE: 01/17/2022 Therapeutic Exercise: NuStep L6 x 5 min with UE/LE while taking subjective Slant board calf stretch 3 x 30 sec Seated hamstring stretch 2 x 30 sec each Modified thomas stretch 2 x 30 sec each SLR 2 x 5 partial range Side clamshell 2 x 10 each Sit to stand without UE support 2 x 10 from elevated table Standing heel raises 2 x 10    PATIENT EDUCATION:  Education details: HEP  Person educated: Patient Education method: Consulting civil engineer, demo, cues, handout  Education comprehension: verbalized understanding, returned demo, cues    HOME EXERCISE PROGRAM: Access Code: [email or web address] URL: [email or web address] Date: 01/22/2022 Prepared by: Gwendolyn Grant  Exercises - Seated Hamstring Stretch  - 2 x daily - 7 x weekly - 3 sets - 30 sec  hold - Gastroc Stretch on Wall  - 2 x daily - 7 x weekly - 3 sets - 30 sec  hold - Modified Thomas Stretch  - 2 x daily - 7 x weekly - 3 sets - 30 sec  hold - Supine Bridge  - 1 x daily - 7 x weekly - 2 sets - 10 reps - Hooklying Clamshell with Resistance  - 1 x daily - 7 x weekly - 2 sets - 10 reps    ASSESSMENT: CLINICAL IMPRESSION: Though patient reports high pain levels upon arrival he is no obvious distress throughout session. Educated patient on importance of adhering to home exercise program to assist in pain reduction and improved function with patient verbalizing understanding. No change in hamstring flexibility compared to initial evaluation. Continued with LE stretching and strengthening with overall good tolerance. He has significant difficulty performing SLR requiring cues to maintain quad contraction and control  ascent/descent. No change in pain at conclusion of session.      OBJECTIVE IMPAIRMENTS Abnormal gait, decreased activity tolerance, decreased endurance, decreased knowledge of condition, difficulty walking, decreased ROM, decreased strength, impaired flexibility, improper body mechanics, postural dysfunction, and pain.    ACTIVITY LIMITATIONS carrying, lifting, bending, sitting, standing, squatting, stairs, and locomotion level   PARTICIPATION LIMITATIONS: cleaning, shopping, community activity, and yard work   PERSONAL FACTORS Age, Fitness, Time since onset of injury/illness/exacerbation, and 3+ comorbidities: active cancer undergoing treatment, diabetes, seizures  are also affecting patient's functional outcome.      GOALS: Goals reviewed with patient? Yes   SHORT TERM GOALS: Target date: 01/29/22 Patient will be independent and compliant with initial HEP.    Baseline: issued at eval  Goal status: ongoing    2.  Patient will improve bilateral hamstring length by at least 10 degrees and bilateral ankle DF by at least 5 degrees to reduce stress about the knees with closed chain activity.  Baseline: see above  Goal status: ongoing    3.  Patient will demonstrate at least 115 degrees of bilateral knee flexion AROM to improve ability to complete sit<>stand transfer.  Baseline: see above Goal status: INITIAL     LONG TERM GOALS: Target date:02/19/22   Patient will demonstrate 5/5 bilateral knee strength to improve ability to complete stair and curb negotiation.  Baseline: see above Goal status: INITIAL   2.  Patient will demonstrate at least 4+/5 bilateral hip strength to improve stability about the chain with prolonged walking and standing activity.  Baseline: see above  Goal status: INITIAL   3.  Patient will complete 5 x STS in </= 18 seconds to improve his ease of transfers.  Baseline: see above  Goal status: INITIAL   4.  Patient will report pain at worst rated as </= 6/10  to signify improvements in his current condition.  Baseline: 10/10 at worst  Goal status: INITIAL     PLAN: PT FREQUENCY: 2x/week   PT DURATION: 6 weeks   PLANNED INTERVENTIONS: Therapeutic exercises, Therapeutic activity, Neuromuscular re-education, Balance training, Gait training, Patient/Family education, Self Care, Joint mobilization, Stair training, Dry Needling, Cryotherapy, Taping, Manual therapy, and Re-evaluation   PLAN FOR NEXT SESSION: review and progress HEP prn; hip and knee stretching/strengthening progressing to CKC as tolerated   Gwendolyn Grant, PT, DPT, ATC 01/22/22 11:10 AM

## 2022-01-24 ENCOUNTER — Other Ambulatory Visit: Payer: Self-pay

## 2022-01-24 ENCOUNTER — Ambulatory Visit: Payer: Medicare HMO | Admitting: Physical Therapy

## 2022-01-24 ENCOUNTER — Encounter: Payer: Self-pay | Admitting: Physical Therapy

## 2022-01-24 DIAGNOSIS — M25562 Pain in left knee: Secondary | ICD-10-CM | POA: Diagnosis not present

## 2022-01-24 DIAGNOSIS — M6281 Muscle weakness (generalized): Secondary | ICD-10-CM

## 2022-01-24 DIAGNOSIS — G8929 Other chronic pain: Secondary | ICD-10-CM

## 2022-01-24 NOTE — Therapy (Signed)
OUTPATIENT PHYSICAL THERAPY TREATMENT NOTE   Patient Name: Jared Shea. MRN: [id number] DOB:[birth year], 66 y.o., male Today's Date: 01/24/2022  PCP: [name], DO   REFERRING PROVIDER: Leandrew Koyanagi, MD   END OF SESSION:   PT End of Session - 01/24/22 1048     Visit Number 4    Number of Visits 13    Date for PT Re-Evaluation 02/24/22    Authorization Type [employer]    Authorization Time Period 01/17/2022 - 03/03/2022    Authorization - Visit Number 3    Authorization - Number of Visits 12    PT Start Time 0093    PT Stop Time 1125    PT Time Calculation (min) 40 min    Activity Tolerance Patient tolerated treatment well    Behavior During Therapy [facility] for tasks assessed/performed               Past Medical History:  Diagnosis Date   Brain mass    Bursitis of right hip    Chest pain 08/12/2018   Chronic cough    Dependence on nicotine from cigarettes    Diabetes mellitus without complication ([place])    Essential hypertension 07/28/2018   nscl ca dx'd 12/2019   Seizures ([place])    Viral illness 03/23/2019   Past Surgical History:  Procedure Laterality Date   APPLICATION OF CRANIAL NAVIGATION N/A 01/07/2020   Procedure: APPLICATION OF CRANIAL NAVIGATION;  Surgeon: Ashok Pall, MD;  Location: [place];  Service: Neurosurgery;  Laterality: N/A;   CRANIOTOMY Right 01/07/2020   Procedure: RIGHT CRANIOTOMY FOR TUMOR RESECTION;  Surgeon: Ashok Pall, MD;  Location: [place];  Service: Neurosurgery;  Laterality: Right;  rm 21   ENDOBRONCHIAL ULTRASOUND N/A 12/21/2019   Procedure: ENDOBRONCHIAL ULTRASOUND;  Surgeon: Laurin Coder, MD;  [facility];  Service: Pulmonary;  Laterality: N/A;   FINE NEEDLE ASPIRATION  12/21/2019   Procedure: FINE NEEDLE ASPIRATION (FNA) LINEAR;  Surgeon: Laurin Coder, MD;  Location: [facility];  Service: Pulmonary;;   IR IMAGING GUIDED PORT INSERTION  03/03/2020   NO PAST SURGERIES     VIDEO BRONCHOSCOPY N/A 12/21/2019    Procedure: VIDEO BRONCHOSCOPY WITHOUT FLUORO;  Surgeon: Laurin Coder, MD;  [facility];  Service: Pulmonary;  Laterality: N/A;   Patient Active Problem List   Diagnosis Date Noted   Bilateral primary osteoarthritis of knee 12/06/2021   Chills 09/08/2021   Primary osteoarthritis of right knee 08/15/2021   Primary osteoarthritis of left knee 08/15/2021   Erectile dysfunction 07/11/2021   Knee pain 07/11/2021   Dysuria 04/03/2021   Vision changes 03/14/2021   Memory loss 03/14/2021   Port-A-Cath in place 07/05/2020   Frequent headaches 06/27/2020   Polyarthralgia 06/15/2020   Headache 05/09/2020   Encounter for antineoplastic immunotherapy 04/18/2020   Goals of care, counseling/discussion 04/18/2020   Adenocarcinoma, metastatic ([place]) 01/07/2020   Adjustment disorder with depressed mood 01/06/2020   Thyroid nodule 01/04/2020   Non-small cell carcinoma of lung, stage 4 w/ mets to brain ([place]) 12/31/2019   Partial symptomatic epilepsy with complex partial seizures, not intractable, without status epilepticus ([place]) 12/23/2019   Malignant neoplasm metastatic to brain ([place]) 12/23/2019   Focal seizures ([place]) 12/09/2019   Healthcare maintenance 12/24/2018   Essential hypertension 07/28/2018    REFERRING DIAG: Chronic pain of both knees  THERAPY DIAG:  Chronic pain of left knee  Chronic pain of right knee  Muscle weakness (generalized)  Rationale for Evaluation and Treatment  Rehabilitation  PERTINENT HISTORY: Stage IV non-small cell lung cancer, adenocarcinoma with solitary brain metastasis in addition to right hilar and mediastinal lymphadenopathy diagnosed in August 2021 currently being treated. Seizures   PRECAUTIONS: Active cancer   SUBJECTIVE: Patient reports he still hurts, feeling about the same.  PAIN:  Are you having pain? Yes: NPRS scale: 7/10 Pain location: Bilateral knees  Pain description: Sore  Aggravating factors: when transition from sitting to  standing Relieving factors: cortisone injection  PATIENT GOALS "get back on track, where my knees don't hurt."    OBJECTIVE: (objective measures completed at initial evaluation unless otherwise dated) PATIENT SURVEYS:  FOTO 51% function to 71% predicted    MUSCLE LENGTH: Hamstrings: Right lacking 40 deg; Left lacking 35 deg  01/22/22:Hamstring: Rt lacking 40 deg; Lt lacking 35 deg    POSTURE: rounded shoulders and forward head   PALPATION: TTP bilateral inferior pole of patella, fat pad, medial femoral condyle    LOWER EXTREMITY ROM:   Active ROM Right eval Left eval  Hip flexion      Hip extension      Hip abduction      Hip adduction      Hip internal rotation      Hip external rotation      Knee flexion 100 pn 109 pn  Knee extension WNL WNL  Ankle dorsiflexion Lacking 2 Lacking 5  Ankle plantarflexion      Ankle inversion      Ankle eversion       (Blank rows = not tested)   LOWER EXTREMITY MMT:   MMT Right eval Left eval  Hip flexion 4- 4-  Hip extension 4- 4-  Hip abduction 4- 3+  Hip adduction      Hip internal rotation      Hip external rotation      Knee flexion 4+ 4+  Knee extension 4 pn 4 pn  Ankle dorsiflexion      Ankle plantarflexion      Ankle inversion      Ankle eversion       (Blank rows = not tested)   LOWER EXTREMITY SPECIAL TESTS:  Valgus stress (- bilateral)  Varus stress (- bilateral)  Anterior drawer (- bilateral)  Posterior drawer (- bilateral)  Clarke's sign (+ bilateral)  McMurray's (remains guarded)  Ely's (+ bilateral)    FUNCTIONAL TESTS:  5 times sit to stand: 24 seconds   GAIT: Distance walked: 15 ft  Assistive device utilized: None Level of assistance: Complete Independence Comments: excessive pronation LLE, lateral trunk lean, WBOS       TODAY'S TREATMENT: [facility] Adult PT Treatment:                                                DATE: 01/24/22 Therapeutic Exercise: NuStep L6 x 5 min with UE/LE while taking  subjective SLR partial range 2 x 10 each SAQ with 3# 2 x 10 each Hooklying clamshell with red 2 x 20 Bridge 2 x 10 Sidelying hip abduction 2 x 10 each Seated hamstring curl with red 2 x 10 each LAQ with 3# 2 x 10 each Sit to stand from elevated table 2 x 10 Standing heel raises 2 x 10   [facility] Adult PT Treatment:  DATE: 01/22/22 Therapeutic Exercise: NuStep level 6 x 5 minutes UE/LE Calf stretch on wedge x 60 sec  Prone quad stretch x 60 sec each  HS stretch with strap 2 x 30 sec each  Hip bridge 2 x 10  Partial range SLR 2 x 6  Hooklying resisted hip abduction blue band 2 x 10  Updated HEP  [facility] Adult PT Treatment:                                                DATE: 01/17/2022 Therapeutic Exercise: NuStep L6 x 5 min with UE/LE while taking subjective Slant board calf stretch 3 x 30 sec Seated hamstring stretch 2 x 30 sec each Modified thomas stretch 2 x 30 sec each SLR 2 x 5 partial range Side clamshell 2 x 10 each Sit to stand without UE support 2 x 10 from elevated table Standing heel raises 2 x 10    PATIENT EDUCATION:  Education details: HEP  Person educated: Patient Education method: Consulting civil engineer, demo, cues, handout  Education comprehension: verbalized understanding, returned demo, cues    HOME EXERCISE PROGRAM: Access Code: [email or web address]    ASSESSMENT: CLINICAL IMPRESSION: Patient tolerated therapy well with no adverse effects. Therapy focused on progressing LE strength and patient able to complete all exercises despite report of increased pain. He does exhibit improved ability to perform SLR but still with limitation and difficulty due to weakness. Progressed to some closed chain strengthening. No changes made to HEP this visit. Patient would benefit from continued skilled PT to progress his mobility and strength in order to reduce pain and maximize functional ability.     OBJECTIVE IMPAIRMENTS Abnormal gait, decreased  activity tolerance, decreased endurance, decreased knowledge of condition, difficulty walking, decreased ROM, decreased strength, impaired flexibility, improper body mechanics, postural dysfunction, and pain.    ACTIVITY LIMITATIONS carrying, lifting, bending, sitting, standing, squatting, stairs, and locomotion level   PARTICIPATION LIMITATIONS: cleaning, shopping, community activity, and yard work   PERSONAL FACTORS Age, Fitness, Time since onset of injury/illness/exacerbation, and 3+ comorbidities: active cancer undergoing treatment, diabetes, seizures  are also affecting patient's functional outcome.      GOALS: Goals reviewed with patient? Yes   SHORT TERM GOALS: Target date: 01/29/22 Patient will be independent and compliant with initial HEP.    Baseline: issued at eval  Goal status: ongoing    2.  Patient will improve bilateral hamstring length by at least 10 degrees and bilateral ankle DF by at least 5 degrees to reduce stress about the knees with closed chain activity.  Baseline: see above  Goal status: ongoing    3.  Patient will demonstrate at least 115 degrees of bilateral knee flexion AROM to improve ability to complete sit<>stand transfer.  Baseline: see above Goal status: INITIAL     LONG TERM GOALS: Target date:02/19/22   Patient will demonstrate 5/5 bilateral knee strength to improve ability to complete stair and curb negotiation.  Baseline: see above Goal status: INITIAL   2.  Patient will demonstrate at least 4+/5 bilateral hip strength to improve stability about the chain with prolonged walking and standing activity.  Baseline: see above  Goal status: INITIAL   3.  Patient will complete 5 x STS in </= 18 seconds to improve his ease of transfers.  Baseline: see above  Goal status: INITIAL   4.  Patient will report pain at worst rated as </= 6/10 to signify improvements in his current condition.  Baseline: 10/10 at worst  Goal status: INITIAL      PLAN: PT FREQUENCY: 2x/week   PT DURATION: 6 weeks   PLANNED INTERVENTIONS: Therapeutic exercises, Therapeutic activity, Neuromuscular re-education, Balance training, Gait training, Patient/Family education, Self Care, Joint mobilization, Stair training, Dry Needling, Cryotherapy, Taping, Manual therapy, and Re-evaluation   PLAN FOR NEXT SESSION: review and progress HEP prn; hip and knee stretching/strengthening progressing to CKC as tolerated    Hilda Blades, PT, DPT, LAT, ATC 01/24/22  11:26 AM Phone: [phone] Fax: [phone]

## 2022-01-26 NOTE — Therapy (Signed)
OUTPATIENT PHYSICAL THERAPY TREATMENT NOTE   Patient Name: Jared Shea. MRN: [id number] DOB:02/15/1956, 66 y.o., male Today's Date: 01/29/2022  PCP: [name], DO   REFERRING PROVIDER: Leandrew Koyanagi, MD   END OF SESSION:   PT End of Session - 01/29/22 1006     Visit Number 5    Number of Visits 13    Date for PT Re-Evaluation 02/24/22    Authorization Type [employer]    Authorization Time Period 01/17/2022 - 03/03/2022    Authorization - Visit Number 4    Authorization - Number of Visits 12    PT Start Time 1000    PT Stop Time 1040    PT Time Calculation (min) 40 min    Activity Tolerance Patient tolerated treatment well    Behavior During Therapy [facility] for tasks assessed/performed                Past Medical History:  Diagnosis Date   Brain mass    Bursitis of right hip    Chest pain 08/12/2018   Chronic cough    Dependence on nicotine from cigarettes    Diabetes mellitus without complication ([place])    Essential hypertension 07/28/2018   nscl ca dx'd 12/2019   Seizures ([place])    Viral illness 03/23/2019   Past Surgical History:  Procedure Laterality Date   APPLICATION OF CRANIAL NAVIGATION N/A 01/07/2020   Procedure: APPLICATION OF CRANIAL NAVIGATION;  Surgeon: Ashok Pall, MD;  Location: [place];  Service: Neurosurgery;  Laterality: N/A;   CRANIOTOMY Right 01/07/2020   Procedure: RIGHT CRANIOTOMY FOR TUMOR RESECTION;  Surgeon: Ashok Pall, MD;  Location: [place];  Service: Neurosurgery;  Laterality: Right;  rm 21   ENDOBRONCHIAL ULTRASOUND N/A 12/21/2019   Procedure: ENDOBRONCHIAL ULTRASOUND;  Surgeon: Laurin Coder, MD;  [facility];  Service: Pulmonary;  Laterality: N/A;   FINE NEEDLE ASPIRATION  12/21/2019   Procedure: FINE NEEDLE ASPIRATION (FNA) LINEAR;  Surgeon: Laurin Coder, MD;  [facility];  Service: Pulmonary;;   IR IMAGING GUIDED PORT INSERTION  03/03/2020   NO PAST SURGERIES     VIDEO BRONCHOSCOPY N/A 12/21/2019    Procedure: VIDEO BRONCHOSCOPY WITHOUT FLUORO;  Surgeon: Laurin Coder, MD;  [facility];  Service: Pulmonary;  Laterality: N/A;   Patient Active Problem List   Diagnosis Date Noted   Bilateral primary osteoarthritis of knee 12/06/2021   Chills 09/08/2021   Primary osteoarthritis of right knee 08/15/2021   Primary osteoarthritis of left knee 08/15/2021   Erectile dysfunction 07/11/2021   Knee pain 07/11/2021   Dysuria 04/03/2021   Vision changes 03/14/2021   Memory loss 03/14/2021   Port-A-Cath in place 07/05/2020   Frequent headaches 06/27/2020   Polyarthralgia 06/15/2020   Headache 05/09/2020   Encounter for antineoplastic immunotherapy 04/18/2020   Goals of care, counseling/discussion 04/18/2020   Adenocarcinoma, metastatic ([place]) 01/07/2020   Adjustment disorder with depressed mood 01/06/2020   Thyroid nodule 01/04/2020   Non-small cell carcinoma of lung, stage 4 w/ mets to brain ([place]) 12/31/2019   Partial symptomatic epilepsy with complex partial seizures, not intractable, without status epilepticus ([place]) 12/23/2019   Malignant neoplasm metastatic to brain ([place]) 12/23/2019   Focal seizures ([place]) 12/09/2019   Healthcare maintenance 12/24/2018   Essential hypertension 07/28/2018    REFERRING DIAG: Chronic pain of both knees  THERAPY DIAG:  Chronic pain of left knee  Chronic pain of right knee  Muscle weakness (generalized)  Rationale for Evaluation and  Treatment Rehabilitation  PERTINENT HISTORY: Stage IV non-small cell lung cancer, adenocarcinoma with solitary brain metastasis in addition to right hilar and mediastinal lymphadenopathy diagnosed in August 2021 currently being treated. Seizures   PRECAUTIONS: Active cancer   SUBJECTIVE: Patient reports he still has knee pain. Otherwise doing alright.  PAIN:  Are you having pain? Yes: NPRS scale: 7/10 Pain location: Bilateral knees  Pain description: Sore  Aggravating factors: when transition from  sitting to standing Relieving factors: cortisone injection  PATIENT GOALS "get back on track, where my knees don't hurt."    OBJECTIVE: (objective measures completed at initial evaluation unless otherwise dated) PATIENT SURVEYS:  FOTO 51% function to 71% predicted    MUSCLE LENGTH: Hamstrings: Right lacking 40 deg; Left lacking 35 deg  01/22/22:Hamstring: Rt lacking 40 deg; Lt lacking 35 deg    POSTURE: rounded shoulders and forward head   PALPATION: TTP bilateral inferior pole of patella, fat pad, medial femoral condyle    LOWER EXTREMITY ROM:   Active ROM Right eval Left eval  Hip flexion      Hip extension      Hip abduction      Hip adduction      Hip internal rotation      Hip external rotation      Knee flexion 100 pn 109 pn  Knee extension WNL WNL  Ankle dorsiflexion Lacking 2 Lacking 5  Ankle plantarflexion      Ankle inversion      Ankle eversion       (Blank rows = not tested)   LOWER EXTREMITY MMT:   MMT Right eval Left eval Rt / Lt 01/29/2022  Hip flexion 4- 4- 4- / 4-  Hip extension 4- 4- 4- / 4-  Hip abduction 4- 3+ 4- / 4-  Hip adduction       Hip internal rotation       Hip external rotation       Knee flexion 4+ 4+ 4+ / 4+  Knee extension 4 pn 4 pn 4 / 4  Ankle dorsiflexion       Ankle plantarflexion       Ankle inversion       Ankle eversion        (Blank rows = not tested)   LOWER EXTREMITY SPECIAL TESTS:  Valgus stress (- bilateral)  Varus stress (- bilateral)  Anterior drawer (- bilateral)  Posterior drawer (- bilateral)  Clarke's sign (+ bilateral)  McMurray's (remains guarded)  Ely's (+ bilateral)    FUNCTIONAL TESTS:  5 times sit to stand: 24 seconds   GAIT: Distance walked: 15 ft  Assistive device utilized: None Level of assistance: Complete Independence Comments: excessive pronation LLE, lateral trunk lean, WBOS       TODAY'S TREATMENT: [facility] Adult PT Treatment:                                                DATE:  01/29/22 Therapeutic Exercise: NuStep L6 x 5 min with UE/LE while taking subjective Slant board calf stretch 3 x 30 sec Seated hamstring stretch 2 x 30 sec each Modified thomas stretch 2 x 30 sec each SLR partial range 2 x 10 each Bridge 2 x 10 Sidelying hip abduction 2 x 10 each Seated hamstring curl with red 2 x 10 each LAQ with 4# 2 x 10  each Sit to stand from elevated table without UE support 2 x 10   [facility] Adult PT Treatment:                                                DATE: 01/24/22 Therapeutic Exercise: NuStep L6 x 5 min with UE/LE while taking subjective SLR partial range 2 x 10 each SAQ with 3# 2 x 10 each Hooklying clamshell with red 2 x 20 Bridge 2 x 10 Sidelying hip abduction 2 x 10 each Seated hamstring curl with red 2 x 10 each LAQ with 3# 2 x 10 each Sit to stand from elevated table 2 x 10 Standing heel raises 2 x 10  [facility] Adult PT Treatment:                                                DATE: 01/22/22 Therapeutic Exercise: NuStep level 6 x 5 minutes UE/LE Calf stretch on wedge x 60 sec  Prone quad stretch x 60 sec each  HS stretch with strap 2 x 30 sec each  Hip bridge 2 x 10  Partial range SLR 2 x 6  Hooklying resisted hip abduction blue band 2 x 10  Updated HEP   PATIENT EDUCATION:  Education details: HEP  Person educated: Patient Education method: Consulting civil engineer, demo, cues, handout  Education comprehension: verbalized understanding, returned demo, cues    HOME EXERCISE PROGRAM: Access Code: [email or web address]    ASSESSMENT: CLINICAL IMPRESSION: Patient tolerated therapy well with no adverse effects. Therapy continues to focus on progressing flexibility and strength with good tolerance. Majority of exercises remain mat based and he does continue to exhibit gross strength deficit of the hips and knees. He is tolerating gradual progressions in strengthening exercises and updated his HEP to progress strengthening for knees and hips at home. Patient would benefit  from continued skilled PT to progress his mobility and strength in order to reduce pain and maximize functional ability.     OBJECTIVE IMPAIRMENTS Abnormal gait, decreased activity tolerance, decreased endurance, decreased knowledge of condition, difficulty walking, decreased ROM, decreased strength, impaired flexibility, improper body mechanics, postural dysfunction, and pain.    ACTIVITY LIMITATIONS carrying, lifting, bending, sitting, standing, squatting, stairs, and locomotion level   PARTICIPATION LIMITATIONS: cleaning, shopping, community activity, and yard work   PERSONAL FACTORS Age, Fitness, Time since onset of injury/illness/exacerbation, and 3+ comorbidities: active cancer undergoing treatment, diabetes, seizures  are also affecting patient's functional outcome.      GOALS: Goals reviewed with patient? Yes   SHORT TERM GOALS: Target date: 01/29/22 Patient will be independent and compliant with initial HEP.  Baseline: issued at eval  Goal status: ongoing    2.  Patient will improve bilateral hamstring length by at least 10 degrees and bilateral ankle DF by at least 5 degrees to reduce stress about the knees with closed chain activity.  Baseline: see above  Goal status: ongoing    3.  Patient will demonstrate at least 115 degrees of bilateral knee flexion AROM to improve ability to complete sit<>stand transfer.  Baseline: see above Goal status: INITIAL     LONG TERM GOALS: Target date:02/19/22   Patient will demonstrate 5/5 bilateral knee strength to improve ability to  complete stair and curb negotiation.  Baseline: see above Goal status: INITIAL   2.  Patient will demonstrate at least 4+/5 bilateral hip strength to improve stability about the chain with prolonged walking and standing activity.  Baseline: see above  Goal status: INITIAL   3.  Patient will complete 5 x STS in </= 18 seconds to improve his ease of transfers.  Baseline: see above  Goal status: INITIAL    4.  Patient will report pain at worst rated as </= 6/10 to signify improvements in his current condition.  Baseline: 10/10 at worst  Goal status: INITIAL     PLAN: PT FREQUENCY: 2x/week   PT DURATION: 6 weeks   PLANNED INTERVENTIONS: Therapeutic exercises, Therapeutic activity, Neuromuscular re-education, Balance training, Gait training, Patient/Family education, Self Care, Joint mobilization, Stair training, Dry Needling, Cryotherapy, Taping, Manual therapy, and Re-evaluation   PLAN FOR NEXT SESSION: review and progress HEP prn; hip and knee stretching/strengthening progressing to CKC as tolerated    Hilda Blades, PT, DPT, LAT, ATC 01/29/22  10:44 AM Phone: [phone] Fax: [phone]

## 2022-01-29 ENCOUNTER — Encounter: Payer: Self-pay | Admitting: Physical Therapy

## 2022-01-29 ENCOUNTER — Ambulatory Visit: Payer: Medicare HMO | Admitting: Physical Therapy

## 2022-01-29 ENCOUNTER — Other Ambulatory Visit: Payer: Self-pay

## 2022-01-29 DIAGNOSIS — G8929 Other chronic pain: Secondary | ICD-10-CM

## 2022-01-29 DIAGNOSIS — M6281 Muscle weakness (generalized): Secondary | ICD-10-CM

## 2022-01-29 DIAGNOSIS — M25562 Pain in left knee: Secondary | ICD-10-CM | POA: Diagnosis not present

## 2022-01-29 NOTE — Therapy (Signed)
OUTPATIENT PHYSICAL THERAPY TREATMENT NOTE   Patient Name: Jared Shea. MRN: [id number] DOB:Oct 19, 1955, 66 y.o., male Today's Date: 01/31/2022  PCP: [name], DO   REFERRING PROVIDER: Leandrew Koyanagi, MD   END OF SESSION:   PT End of Session - 01/31/22 1055     Visit Number 6    Number of Visits 13    Date for PT Re-Evaluation 02/24/22    Authorization Type [employer]    Authorization Time Period 01/17/2022 - 03/03/2022    Authorization - Visit Number 5    Authorization - Number of Visits 12    PT Start Time 0037    PT Stop Time 1125    PT Time Calculation (min) 40 min    Activity Tolerance Patient tolerated treatment well    Behavior During Therapy [facility] for tasks assessed/performed                 Past Medical History:  Diagnosis Date   Brain mass    Bursitis of right hip    Chest pain 08/12/2018   Chronic cough    Dependence on nicotine from cigarettes    Diabetes mellitus without complication ([place])    Essential hypertension 07/28/2018   nscl ca dx'd 12/2019   Seizures ([place])    Viral illness 03/23/2019   Past Surgical History:  Procedure Laterality Date   APPLICATION OF CRANIAL NAVIGATION N/A 01/07/2020   Procedure: APPLICATION OF CRANIAL NAVIGATION;  Surgeon: Ashok Pall, MD;  Location: [place];  Service: Neurosurgery;  Laterality: N/A;   CRANIOTOMY Right 01/07/2020   Procedure: RIGHT CRANIOTOMY FOR TUMOR RESECTION;  Surgeon: Ashok Pall, MD;  Location: [place];  Service: Neurosurgery;  Laterality: Right;  rm 21   ENDOBRONCHIAL ULTRASOUND N/A 12/21/2019   Procedure: ENDOBRONCHIAL ULTRASOUND;  Surgeon: Laurin Coder, MD;  [facility];  Service: Pulmonary;  Laterality: N/A;   FINE NEEDLE ASPIRATION  12/21/2019   Procedure: FINE NEEDLE ASPIRATION (FNA) LINEAR;  Surgeon: Laurin Coder, MD;  [facility];  Service: Pulmonary;;   IR IMAGING GUIDED PORT INSERTION  03/03/2020   NO PAST SURGERIES     VIDEO BRONCHOSCOPY N/A 12/21/2019    Procedure: VIDEO BRONCHOSCOPY WITHOUT FLUORO;  Surgeon: Laurin Coder, MD;  [facility];  Service: Pulmonary;  Laterality: N/A;   Patient Active Problem List   Diagnosis Date Noted   Bilateral primary osteoarthritis of knee 12/06/2021   Chills 09/08/2021   Primary osteoarthritis of right knee 08/15/2021   Primary osteoarthritis of left knee 08/15/2021   Erectile dysfunction 07/11/2021   Knee pain 07/11/2021   Dysuria 04/03/2021   Vision changes 03/14/2021   Memory loss 03/14/2021   Port-A-Cath in place 07/05/2020   Frequent headaches 06/27/2020   Polyarthralgia 06/15/2020   Headache 05/09/2020   Encounter for antineoplastic immunotherapy 04/18/2020   Goals of care, counseling/discussion 04/18/2020   Adenocarcinoma, metastatic ([place]) 01/07/2020   Adjustment disorder with depressed mood 01/06/2020   Thyroid nodule 01/04/2020   Non-small cell carcinoma of lung, stage 4 w/ mets to brain ([place]) 12/31/2019   Partial symptomatic epilepsy with complex partial seizures, not intractable, without status epilepticus ([place]) 12/23/2019   Malignant neoplasm metastatic to brain ([place]) 12/23/2019   Focal seizures ([place]) 12/09/2019   Healthcare maintenance 12/24/2018   Essential hypertension 07/28/2018    REFERRING DIAG: Chronic pain of both knees  THERAPY DIAG:  Chronic pain of left knee  Chronic pain of right knee  Muscle weakness (generalized)  Rationale for Evaluation  and Treatment Rehabilitation  PERTINENT HISTORY: Stage IV non-small cell lung cancer, adenocarcinoma with solitary brain metastasis in addition to right hilar and mediastinal lymphadenopathy diagnosed in August 2021 currently being treated. Seizures   PRECAUTIONS: Active cancer   SUBJECTIVE: Patient reports he still has knee pain. He is consistent with his exercises at home.  PAIN:  Are you having pain? Yes: NPRS scale: 7/10 Pain location: Bilateral knees  Pain description: Sore  Aggravating factors:  when transition from sitting to standing Relieving factors: cortisone injection  PATIENT GOALS "get back on track, where my knees don't hurt."    OBJECTIVE: (objective measures completed at initial evaluation unless otherwise dated) PATIENT SURVEYS:  FOTO 51% function to 71% predicted   01/31/2022: 63%   MUSCLE LENGTH: Hamstrings: Right lacking 40 deg; Left lacking 35 deg 01/22/22:Hamstring: Rt lacking 40 deg; Lt lacking 35 deg        01/31/22:Hamstring: Rt lacking 25 deg; Lt lacking 25 deg     POSTURE: rounded shoulders and forward head   PALPATION: TTP bilateral inferior pole of patella, fat pad, medial femoral condyle    LOWER EXTREMITY ROM:   Active ROM Right eval Left eval Rt / Lt 01/31/2022  Hip flexion       Hip extension       Hip abduction       Hip adduction       Hip internal rotation       Hip external rotation       Knee flexion 100 pn 109 pn 130 / 130  Knee extension WNL WNL   Ankle dorsiflexion Lacking 2 Lacking 5 0 / 0  Ankle plantarflexion       Ankle inversion       Ankle eversion        (Blank rows = not tested)   LOWER EXTREMITY MMT:   MMT Right eval Left eval Rt / Lt 01/29/2022  Hip flexion 4- 4- 4- / 4-  Hip extension 4- 4- 4- / 4-  Hip abduction 4- 3+ 4- / 4-  Hip adduction       Hip internal rotation       Hip external rotation       Knee flexion 4+ 4+ 4+ / 4+  Knee extension 4 pn 4 pn 4 / 4  Ankle dorsiflexion       Ankle plantarflexion       Ankle inversion       Ankle eversion        (Blank rows = not tested)   LOWER EXTREMITY SPECIAL TESTS:  Valgus stress (- bilateral)  Varus stress (- bilateral)  Anterior drawer (- bilateral)  Posterior drawer (- bilateral)  Clarke's sign (+ bilateral)  McMurray's (remains guarded)  Ely's (+ bilateral)    FUNCTIONAL TESTS:  5 times sit to stand: 24 seconds   GAIT: Distance walked: 15 ft  Assistive device utilized: None Level of assistance: Complete Independence Comments: excessive  pronation LLE, lateral trunk lean, WBOS       TODAY'S TREATMENT: [facility] Adult PT Treatment:                                                DATE: 01/31/22 Therapeutic Exercise: NuStep L6 x 5 min with UE/LE while taking subjective Seated hamstring stretch 2 x 30 sec each SLR 2 x 10  each Bridge 2 x 10 Sit to stand from elevated table without UE support 2 x 10 Standing hip abduction with green at knees 2 x 10 each Standing heel raises 2 x 10 Knee extension machine 20# 2 x 10 Knee flexion machine 35# 2 x 10 Leg press (BATCA) 55# 3 x 10 Forward 8" step-up 2 x 10 each   [facility] Adult PT Treatment:                                                DATE: 01/29/22 Therapeutic Exercise: NuStep L6 x 5 min with UE/LE while taking subjective Slant board calf stretch 3 x 30 sec Seated hamstring stretch 2 x 30 sec each Modified thomas stretch 2 x 30 sec each SLR partial range 2 x 10 each Bridge 2 x 10 Sidelying hip abduction 2 x 10 each Seated hamstring curl with red 2 x 10 each LAQ with 4# 2 x 10 each Sit to stand from elevated table without UE support 2 x 10  [facility] Adult PT Treatment:                                                DATE: 01/24/22 Therapeutic Exercise: NuStep L6 x 5 min with UE/LE while taking subjective SLR partial range 2 x 10 each SAQ with 3# 2 x 10 each Hooklying clamshell with red 2 x 20 Bridge 2 x 10 Sidelying hip abduction 2 x 10 each Seated hamstring curl with red 2 x 10 each LAQ with 3# 2 x 10 each Sit to stand from elevated table 2 x 10 Standing heel raises 2 x 10   PATIENT EDUCATION:  Education details: HEP , FOTO Person educated: Patient Education method: Explanation, demo, cues, handout  Education comprehension: verbalized understanding, returned demo, cues    HOME EXERCISE PROGRAM: Access Code: [email or web address]    ASSESSMENT: CLINICAL IMPRESSION: Patient tolerated therapy well with no adverse effects. Therapy continues to focus on progressing flexibility and  strength with good tolerance. He does demonstrate improved knee and ankle motion, as well as hamstring flexibility. He was able to progress to machine based strengthening without any report of increased pain. He reports improvement in functional level on FOTO. No changes to HEP this visit. Patient would benefit from continued skilled PT to progress his mobility and strength in order to reduce pain and maximize functional ability.     OBJECTIVE IMPAIRMENTS Abnormal gait, decreased activity tolerance, decreased endurance, decreased knowledge of condition, difficulty walking, decreased ROM, decreased strength, impaired flexibility, improper body mechanics, postural dysfunction, and pain.    ACTIVITY LIMITATIONS carrying, lifting, bending, sitting, standing, squatting, stairs, and locomotion level   PARTICIPATION LIMITATIONS: cleaning, shopping, community activity, and yard work   PERSONAL FACTORS Age, Fitness, Time since onset of injury/illness/exacerbation, and 3+ comorbidities: active cancer undergoing treatment, diabetes, seizures  are also affecting patient's functional outcome.      GOALS: Goals reviewed with patient? Yes   SHORT TERM GOALS: Target date: 01/29/22  Patient will be independent and compliant with initial HEP.  Baseline: issued at eval  01/31/2022: independent Goal status: MET   2.  Patient will improve bilateral hamstring length by at least 10 degrees and bilateral ankle DF by  at least 5 degrees to reduce stress about the knees with closed chain activity.  Baseline: see above  01/31/2022: see above Goal status: PARTIALLY MET   3.  Patient will demonstrate at least 115 degrees of bilateral knee flexion AROM to improve ability to complete sit<>stand transfer.  Baseline: see above 01/31/2022: 130 deg bilat Goal status: MET     LONG TERM GOALS: Target date:02/19/22   Patient will demonstrate 5/5 bilateral knee strength to improve ability to complete stair and curb  negotiation.  Baseline: see above Goal status: INITIAL   2.  Patient will demonstrate at least 4+/5 bilateral hip strength to improve stability about the chain with prolonged walking and standing activity.  Baseline: see above  Goal status: INITIAL   3.  Patient will complete 5 x STS in </= 18 seconds to improve his ease of transfers.  Baseline: see above  Goal status: INITIAL   4.  Patient will report pain at worst rated as </= 6/10 to signify improvements in his current condition.  Baseline: 10/10 at worst  Goal status: INITIAL     PLAN: PT FREQUENCY: 2x/week   PT DURATION: 6 weeks   PLANNED INTERVENTIONS: Therapeutic exercises, Therapeutic activity, Neuromuscular re-education, Balance training, Gait training, Patient/Family education, Self Care, Joint mobilization, Stair training, Dry Needling, Cryotherapy, Taping, Manual therapy, and Re-evaluation   PLAN FOR NEXT SESSION: review and progress HEP prn; hip and knee stretching/strengthening progressing to CKC as tolerated    Hilda Blades, PT, DPT, LAT, ATC 01/31/22  11:35 AM Phone: [phone] Fax: [phone]

## 2022-01-29 NOTE — Patient Instructions (Signed)
Access Code: [email or web address] URL: [email or web address] Date: 01/29/2022 Prepared by: Hilda Blades  Exercises - Seated Hamstring Stretch  - 2 x daily - 7 x weekly - 3 sets - 30 sec  hold - Gastroc Stretch on Wall  - 2 x daily - 7 x weekly - 3 sets - 30 sec  hold - Modified Thomas Stretch  - 2 x daily - 7 x weekly - 3 sets - 30 sec  hold - Supine Bridge  - 1 x daily - 7 x weekly - 2 sets - 10 reps - Hooklying Clamshell with Resistance  - 1 x daily - 7 x weekly - 2 sets - 10 reps - Active Straight Leg Raise with Quad Set  - 1 x daily - 7 x weekly - 2 sets - 10 reps - Sidelying Hip Abduction  - 1 x daily - 7 x weekly - 2 sets - 10 reps

## 2022-01-31 ENCOUNTER — Encounter: Payer: Self-pay | Admitting: Physical Therapy

## 2022-01-31 ENCOUNTER — Other Ambulatory Visit: Payer: Self-pay

## 2022-01-31 ENCOUNTER — Ambulatory Visit: Payer: Medicare HMO | Admitting: Physical Therapy

## 2022-01-31 DIAGNOSIS — G8929 Other chronic pain: Secondary | ICD-10-CM

## 2022-01-31 DIAGNOSIS — M6281 Muscle weakness (generalized): Secondary | ICD-10-CM

## 2022-01-31 DIAGNOSIS — M25562 Pain in left knee: Secondary | ICD-10-CM | POA: Diagnosis not present

## 2022-02-05 ENCOUNTER — Ambulatory Visit: Payer: Medicare HMO

## 2022-02-05 ENCOUNTER — Inpatient Hospital Stay: Payer: Medicare HMO

## 2022-02-05 ENCOUNTER — Other Ambulatory Visit: Payer: Self-pay

## 2022-02-05 ENCOUNTER — Inpatient Hospital Stay (HOSPITAL_BASED_OUTPATIENT_CLINIC_OR_DEPARTMENT_OTHER): Payer: Medicare HMO | Admitting: Internal Medicine

## 2022-02-05 ENCOUNTER — Encounter: Payer: Self-pay | Admitting: Internal Medicine

## 2022-02-05 DIAGNOSIS — M6281 Muscle weakness (generalized): Secondary | ICD-10-CM

## 2022-02-05 DIAGNOSIS — G8929 Other chronic pain: Secondary | ICD-10-CM

## 2022-02-05 DIAGNOSIS — C799 Secondary malignant neoplasm of unspecified site: Secondary | ICD-10-CM

## 2022-02-05 DIAGNOSIS — M25562 Pain in left knee: Secondary | ICD-10-CM | POA: Diagnosis not present

## 2022-02-05 DIAGNOSIS — Z95828 Presence of other vascular implants and grafts: Secondary | ICD-10-CM

## 2022-02-05 DIAGNOSIS — C3490 Malignant neoplasm of unspecified part of unspecified bronchus or lung: Secondary | ICD-10-CM

## 2022-02-05 DIAGNOSIS — Z5112 Encounter for antineoplastic immunotherapy: Secondary | ICD-10-CM | POA: Diagnosis not present

## 2022-02-05 LAB — CBC WITH DIFFERENTIAL (CANCER CENTER ONLY)
Abs Immature Granulocytes: 0.01 10*3/uL (ref 0.00–0.07)
Basophils Absolute: 0 10*3/uL (ref 0.0–0.1)
Basophils Relative: 1 %
Eosinophils Absolute: 0.1 10*3/uL (ref 0.0–0.5)
Eosinophils Relative: 2 %
HCT: 34.7 % — ABNORMAL LOW (ref 39.0–52.0)
Hemoglobin: 12.4 g/dL — ABNORMAL LOW (ref 13.0–17.0)
Immature Granulocytes: 0 %
Lymphocytes Relative: 28 %
Lymphs Abs: 1.6 10*3/uL (ref 0.7–4.0)
MCH: 30 pg (ref 26.0–34.0)
MCHC: 35.7 g/dL (ref 30.0–36.0)
MCV: 83.8 fL (ref 80.0–100.0)
Monocytes Absolute: 0.4 10*3/uL (ref 0.1–1.0)
Monocytes Relative: 7 %
Neutro Abs: 3.7 10*3/uL (ref 1.7–7.7)
Neutrophils Relative %: 62 %
Platelet Count: 257 10*3/uL (ref 150–400)
RBC: 4.14 MIL/uL — ABNORMAL LOW (ref 4.22–5.81)
RDW: 14.3 % (ref 11.5–15.5)
WBC Count: 5.9 10*3/uL (ref 4.0–10.5)
nRBC: 0 % (ref 0.0–0.2)

## 2022-02-05 LAB — CMP (CANCER CENTER ONLY)
ALT: 17 U/L (ref 0–44)
AST: 17 U/L (ref 15–41)
Albumin: 4.4 g/dL (ref 3.5–5.0)
Alkaline Phosphatase: 75 U/L (ref 38–126)
Anion gap: 4 — ABNORMAL LOW (ref 5–15)
BUN: 14 mg/dL (ref 8–23)
CO2: 28 mmol/L (ref 22–32)
Calcium: 9.1 mg/dL (ref 8.9–10.3)
Chloride: 105 mmol/L (ref 98–111)
Creatinine: 0.65 mg/dL (ref 0.61–1.24)
GFR, Estimated: >60 mL/min (ref >60)
Glucose, Bld: 208 mg/dL — ABNORMAL HIGH (ref 70–99)
Potassium: 3.5 mmol/L (ref 3.5–5.1)
Sodium: 137 mmol/L (ref 135–145)
Total Bilirubin: 0.9 mg/dL (ref 0.3–1.2)
Total Protein: 7.7 g/dL (ref 6.5–8.1)

## 2022-02-05 LAB — TSH: TSH: 0.746 u[IU]/mL (ref 0.350–4.500)

## 2022-02-05 MED ORDER — SODIUM CHLORIDE 0.9% FLUSH
10.0000 mL | Freq: Once | INTRAVENOUS | Status: AC
  Administered 2022-02-05: 10 mL

## 2022-02-05 MED ORDER — HEPARIN SOD (PORK) LOCK FLUSH 100 UNIT/ML IV SOLN
500.0000 [IU] | Freq: Once | INTRAVENOUS | Status: AC | PRN
  Administered 2022-02-05: 500 [IU]

## 2022-02-05 MED ORDER — SODIUM CHLORIDE 0.9% FLUSH
10.0000 mL | INTRAVENOUS | Status: DC | PRN
  Administered 2022-02-05: 10 mL

## 2022-02-05 MED ORDER — SODIUM CHLORIDE 0.9 % IV SOLN
350.0000 mg | Freq: Once | INTRAVENOUS | Status: AC
  Administered 2022-02-05: 350 mg via INTRAVENOUS
  Filled 2022-02-05: qty 7

## 2022-02-05 MED ORDER — SODIUM CHLORIDE 0.9 % IV SOLN: Freq: Once | INTRAVENOUS | Status: AC

## 2022-02-05 NOTE — Therapy (Signed)
OUTPATIENT PHYSICAL THERAPY TREATMENT NOTE   Patient Name: Jared Shea. MRN: [id number] DOB:20-Jul-1955, 66 y.o., male Today's Date: 02/05/2022  PCP: [name], DO   REFERRING PROVIDER: Leandrew Koyanagi, MD   END OF SESSION:   PT End of Session - 02/05/22 1016     Visit Number 7    Number of Visits 13    Date for PT Re-Evaluation 02/24/22    Authorization Type [employer]    Authorization Time Period 01/17/2022 - 03/03/2022    Authorization - Visit Number 6    Authorization - Number of Visits 12    PT Start Time 5035    PT Stop Time 1055    PT Time Calculation (min) 40 min    Activity Tolerance Patient tolerated treatment well    Behavior During Therapy [facility] for tasks assessed/performed                  Past Medical History:  Diagnosis Date   Brain mass    Bursitis of right hip    Chest pain 08/12/2018   Chronic cough    Dependence on nicotine from cigarettes    Diabetes mellitus without complication ([place])    Essential hypertension 07/28/2018   nscl ca dx'd 12/2019   Seizures ([place])    Viral illness 03/23/2019   Past Surgical History:  Procedure Laterality Date   APPLICATION OF CRANIAL NAVIGATION N/A 01/07/2020   Procedure: APPLICATION OF CRANIAL NAVIGATION;  Surgeon: Ashok Pall, MD;  Location: [place];  Service: Neurosurgery;  Laterality: N/A;   CRANIOTOMY Right 01/07/2020   Procedure: RIGHT CRANIOTOMY FOR TUMOR RESECTION;  Surgeon: Ashok Pall, MD;  Location: [place];  Service: Neurosurgery;  Laterality: Right;  rm 21   ENDOBRONCHIAL ULTRASOUND N/A 12/21/2019   Procedure: ENDOBRONCHIAL ULTRASOUND;  Surgeon: Laurin Coder, MD;  [facility];  Service: Pulmonary;  Laterality: N/A;   FINE NEEDLE ASPIRATION  12/21/2019   Procedure: FINE NEEDLE ASPIRATION (FNA) LINEAR;  Surgeon: Laurin Coder, MD;  [facility];  Service: Pulmonary;;   IR IMAGING GUIDED PORT INSERTION  03/03/2020   NO PAST SURGERIES     VIDEO BRONCHOSCOPY N/A 12/21/2019    Procedure: VIDEO BRONCHOSCOPY WITHOUT FLUORO;  Surgeon: Laurin Coder, MD;  [facility];  Service: Pulmonary;  Laterality: N/A;   Patient Active Problem List   Diagnosis Date Noted   Bilateral primary osteoarthritis of knee 12/06/2021   Chills 09/08/2021   Primary osteoarthritis of right knee 08/15/2021   Primary osteoarthritis of left knee 08/15/2021   Erectile dysfunction 07/11/2021   Knee pain 07/11/2021   Dysuria 04/03/2021   Vision changes 03/14/2021   Memory loss 03/14/2021   Port-A-Cath in place 07/05/2020   Frequent headaches 06/27/2020   Polyarthralgia 06/15/2020   Headache 05/09/2020   Encounter for antineoplastic immunotherapy 04/18/2020   Goals of care, counseling/discussion 04/18/2020   Adenocarcinoma, metastatic ([place]) 01/07/2020   Adjustment disorder with depressed mood 01/06/2020   Thyroid nodule 01/04/2020   Non-small cell carcinoma of lung, stage 4 w/ mets to brain ([place]) 12/31/2019   Partial symptomatic epilepsy with complex partial seizures, not intractable, without status epilepticus ([place]) 12/23/2019   Malignant neoplasm metastatic to brain ([place]) 12/23/2019   Focal seizures ([place]) 12/09/2019   Healthcare maintenance 12/24/2018   Essential hypertension 07/28/2018    REFERRING DIAG: Chronic pain of both knees  THERAPY DIAG:  Chronic pain of left knee  Chronic pain of right knee  Muscle weakness (generalized)  Rationale for  Evaluation and Treatment Rehabilitation  PERTINENT HISTORY: Stage IV non-small cell lung cancer, adenocarcinoma with solitary brain metastasis in addition to right hilar and mediastinal lymphadenopathy diagnosed in August 2021 currently being treated. Seizures   PRECAUTIONS: Active cancer   SUBJECTIVE: Patient reports his knees are getting better, but still hurting. He reports compliance with HEP.   PAIN:  Are you having pain? Yes: NPRS scale: 7/10 Pain location: Bilateral knees  Pain description: Sore  Aggravating  factors: when transition from sitting to standing Relieving factors: cortisone injection  PATIENT GOALS "get back on track, where my knees don't hurt."    OBJECTIVE: (objective measures completed at initial evaluation unless otherwise dated) PATIENT SURVEYS:  FOTO 51% function to 71% predicted   01/31/2022: 63%   MUSCLE LENGTH: Hamstrings: Right lacking 40 deg; Left lacking 35 deg 01/22/22:Hamstring: Rt lacking 40 deg; Lt lacking 35 deg        01/31/22:Hamstring: Rt lacking 25 deg; Lt lacking 25 deg     POSTURE: rounded shoulders and forward head   PALPATION: TTP bilateral inferior pole of patella, fat pad, medial femoral condyle    LOWER EXTREMITY ROM:   Active ROM Right eval Left eval Rt / Lt 01/31/2022  Hip flexion       Hip extension       Hip abduction       Hip adduction       Hip internal rotation       Hip external rotation       Knee flexion 100 pn 109 pn 130 / 130  Knee extension WNL WNL   Ankle dorsiflexion Lacking 2 Lacking 5 0 / 0  Ankle plantarflexion       Ankle inversion       Ankle eversion        (Blank rows = not tested)   LOWER EXTREMITY MMT:   MMT Right eval Left eval Rt / Lt 01/29/2022  Hip flexion 4- 4- 4- / 4-  Hip extension 4- 4- 4- / 4-  Hip abduction 4- 3+ 4- / 4-  Hip adduction       Hip internal rotation       Hip external rotation       Knee flexion 4+ 4+ 4+ / 4+  Knee extension 4 pn 4 pn 4 / 4  Ankle dorsiflexion       Ankle plantarflexion       Ankle inversion       Ankle eversion        (Blank rows = not tested)   LOWER EXTREMITY SPECIAL TESTS:  Valgus stress (- bilateral)  Varus stress (- bilateral)  Anterior drawer (- bilateral)  Posterior drawer (- bilateral)  Clarke's sign (+ bilateral)  McMurray's (remains guarded)  Ely's (+ bilateral)    FUNCTIONAL TESTS:  5 times sit to stand: 24 seconds   GAIT: Distance walked: 15 ft  Assistive device utilized: None Level of assistance: Complete Independence Comments:  excessive pronation LLE, lateral trunk lean, WBOS       TODAY'S TREATMENT: [facility] Adult PT Treatment:                                                DATE: 02/05/22 Therapeutic Exercise: NuStep level 6 x 5 minutes UE/LE  Prone quad stretch x 60 sec each  Seated HS stretch 2  x 30 sec each  Sit to stand normal height no UE support 2 trials unable to control  SL eccentric leg press 3 x 10; 20 lbs  Lateral step downs 2 inch 2 x 10  Sidelying hip abduction 2 x 15    [facility] Adult PT Treatment:                                                DATE: 01/31/22 Therapeutic Exercise: NuStep L6 x 5 min with UE/LE while taking subjective Seated hamstring stretch 2 x 30 sec each SLR 2 x 10 each Bridge 2 x 10 Sit to stand from elevated table without UE support 2 x 10 Standing hip abduction with green at knees 2 x 10 each Standing heel raises 2 x 10 Knee extension machine 20# 2 x 10 Knee flexion machine 35# 2 x 10 Leg press (BATCA) 55# 3 x 10 Forward 8" step-up 2 x 10 each   [facility] Adult PT Treatment:                                                DATE: 01/29/22 Therapeutic Exercise: NuStep L6 x 5 min with UE/LE while taking subjective Slant board calf stretch 3 x 30 sec Seated hamstring stretch 2 x 30 sec each Modified thomas stretch 2 x 30 sec each SLR partial range 2 x 10 each Bridge 2 x 10 Sidelying hip abduction 2 x 10 each Seated hamstring curl with red 2 x 10 each LAQ with 4# 2 x 10 each Sit to stand from elevated table without UE support 2 x 10  PATIENT EDUCATION:  Education details: HEP , FOTO Person educated: Patient Education method: Explanation, demo, cues, handout  Education comprehension: verbalized understanding, returned demo, cues    HOME EXERCISE PROGRAM: Access Code: [email or web address]    ASSESSMENT: CLINICAL IMPRESSION: Patient tolerated session well today focusing on progression of BLE strengthening. Emphasis on eccentric quad strengthening today as patient is noted to have  difficulty controlling eccentric phase of sit to stand from normal height. He has significant difficulty controlling lateral step downs from minimal height as his knee has tendency to buckle as opposed to a controlled lower. No change in knee pain at conclusion of session.      OBJECTIVE IMPAIRMENTS Abnormal gait, decreased activity tolerance, decreased endurance, decreased knowledge of condition, difficulty walking, decreased ROM, decreased strength, impaired flexibility, improper body mechanics, postural dysfunction, and pain.    ACTIVITY LIMITATIONS carrying, lifting, bending, sitting, standing, squatting, stairs, and locomotion level   PARTICIPATION LIMITATIONS: cleaning, shopping, community activity, and yard work   PERSONAL FACTORS Age, Fitness, Time since onset of injury/illness/exacerbation, and 3+ comorbidities: active cancer undergoing treatment, diabetes, seizures  are also affecting patient's functional outcome.      GOALS: Goals reviewed with patient? Yes   SHORT TERM GOALS: Target date: 01/29/22  Patient will be independent and compliant with initial HEP.  Baseline: issued at eval  01/31/2022: independent Goal status: MET   2.  Patient will improve bilateral hamstring length by at least 10 degrees and bilateral ankle DF by at least 5 degrees to reduce stress about the knees with closed chain activity.  Baseline: see above  01/31/2022: see above  Goal status: PARTIALLY MET   3.  Patient will demonstrate at least 115 degrees of bilateral knee flexion AROM to improve ability to complete sit<>stand transfer.  Baseline: see above 01/31/2022: 130 deg bilat Goal status: MET     LONG TERM GOALS: Target date:02/19/22   Patient will demonstrate 5/5 bilateral knee strength to improve ability to complete stair and curb negotiation.  Baseline: see above Goal status: INITIAL   2.  Patient will demonstrate at least 4+/5 bilateral hip strength to improve stability about the chain with  prolonged walking and standing activity.  Baseline: see above  Goal status: INITIAL   3.  Patient will complete 5 x STS in </= 18 seconds to improve his ease of transfers.  Baseline: see above  Goal status: INITIAL   4.  Patient will report pain at worst rated as </= 6/10 to signify improvements in his current condition.  Baseline: 10/10 at worst  Goal status: INITIAL     PLAN: PT FREQUENCY: 2x/week   PT DURATION: 6 weeks   PLANNED INTERVENTIONS: Therapeutic exercises, Therapeutic activity, Neuromuscular re-education, Balance training, Gait training, Patient/Family education, Self Care, Joint mobilization, Stair training, Dry Needling, Cryotherapy, Taping, Manual therapy, and Re-evaluation   PLAN FOR NEXT SESSION: review and progress HEP prn; hip and knee stretching/strengthening progressing to CKC as tolerated   Gwendolyn Grant, PT, DPT, ATC 02/05/22 10:55 AM

## 2022-02-05 NOTE — Patient Instructions (Signed)
[facility]   Discharge Instructions: Thank you for choosing [facility] to provide your oncology and hematology care.   If you have a lab appointment with the [facility], please go directly to the [facility] and check in at the registration area.   Wear comfortable clothing and clothing appropriate for easy access to any Portacath or PICC line.   We strive to give you quality time with your provider. You may need to reschedule your appointment if you arrive late (15 or more minutes).  Arriving late affects you and other patients whose appointments are after yours.  Also, if you miss three or more appointments without notifying the office, you may be dismissed from the clinic at the provider's discretion.      For prescription refill requests, have your pharmacy contact our office and allow 72 hours for refills to be completed.    Today you received the following chemotherapy and/or immunotherapy agents: Cemiplimab (Libtayo)       To help prevent nausea and vomiting after your treatment, we encourage you to take your nausea medication as directed.  BELOW ARE SYMPTOMS THAT SHOULD BE REPORTED IMMEDIATELY: *FEVER GREATER THAN 100.4 F (38 C) OR HIGHER *CHILLS OR SWEATING *NAUSEA AND VOMITING THAT IS NOT CONTROLLED WITH YOUR NAUSEA MEDICATION *UNUSUAL SHORTNESS OF BREATH *UNUSUAL BRUISING OR BLEEDING *URINARY PROBLEMS (pain or burning when urinating, or frequent urination) *BOWEL PROBLEMS (unusual diarrhea, constipation, pain near the anus) TENDERNESS IN MOUTH AND THROAT WITH OR WITHOUT PRESENCE OF ULCERS (sore throat, sores in mouth, or a toothache) UNUSUAL RASH, SWELLING OR PAIN  UNUSUAL VAGINAL DISCHARGE OR ITCHING   Items with * indicate a potential emergency and should be followed up as soon as possible or go to the Emergency Department if any problems should occur.  Please show the CHEMOTHERAPY ALERT CARD or IMMUNOTHERAPY ALERT CARD  at check-in to the Emergency Department and triage nurse.  Should you have questions after your visit or need to cancel or reschedule your appointment, please contact [facility]  Dept: [phone]  and follow the prompts.  Office hours are 8:00 a.m. to 4:30 p.m. Monday - Friday. Please note that voicemails left after 4:00 p.m. may not be returned until the following business day.  We are closed weekends and major holidays. You have access to a nurse at all times for urgent questions. Please call the main number to the clinic Dept: [phone] and follow the prompts.   For any non-urgent questions, you may also contact your provider using MyChart. We now offer e-Visits for anyone 48 and older to request care online for non-urgent symptoms. For details visit [email or web address].   Also download the MyChart app! Go to the app store, search "MyChart", open the app, select [facility], and log in with your MyChart username and password.  Masks are optional in the cancer centers. If you would like for your care team to wear a mask while they are taking care of you, please let them know. You may have one support person who is at least 68 years old accompany you for your appointments.

## 2022-02-05 NOTE — Progress Notes (Signed)
Jared [facility] [phone]   [phone]  OFFICE PROGRESS NOTE  [name], DO [place] [address]  DIAGNOSIS: stage IV (TX, N2, M1 C) non-small cell lung cancer, adenocarcinoma diagnosed in August 2021 and presented with solitary brain metastasis in addition to mediastinal lymphadenopathy.  PDL1 Expression 70%   Molecular Biomarkers:  Tumor Mutational Burden - 52 Muts/Mb Microsatellite status - MS-Stable Genomic Findings For a complete list of the genes assayed, please refer to the Appendix. NF1 E1694* MTAP loss exons 2-8 RICTOR amplification ATRX A419V BRAF K483E CDKN2A/B CDKN2A loss, CDKN2B loss DNMT3A E205* FGF10 amplification NTRK1 amplification - equivocal? 7 Disease relevant genes with no reportable alterations: ALK, EGFR, ERBB2, KRAS, MET, RET, ROS1    PRIOR THERAPY:  1) Status post right craniotomy with tumor resection followed by [facility] to solitary brain metastasis under the care of Dr. Lisbeth Renshaw and Dr. Sherral Hammers. 2) Concurrent chemoradiation with weekly carboplatin for AUC of 2 and paclitaxel 45 mg/M2.  First dose 02/01/2020. Status post 5 cycles.  Last dose was given February 29, 2020.   CURRENT THERAPY:  First-line treatment with immunotherapy with Libtayo (Cempilimab) 350 mg IV every 3 weeks.  First dose April 25, 2020.  Status post 31 cycles.  INTERVAL HISTORY: Jared Shea. 66 y.o. male returns to the clinic today for follow-up visit.  The patient is feeling fine today with no concerning complaints except for intermittent right-sided chest pain that has been 1 time a week ago and another time today.  He denied having any cough, shortness of breath or hemoptysis.  He has no nausea, vomiting, diarrhea or constipation.  He has no headache or visual changes.  He continues to have arthralgia and he is currently undergoing physical therapy.  He is here today for evaluation before starting cycle #32 of his  treatment.  MEDICAL HISTORY: Past Medical History:  Diagnosis Date   Brain mass    Bursitis of right hip    Chest pain 08/12/2018   Chronic cough    Dependence on nicotine from cigarettes    Diabetes mellitus without complication ([facility])    Essential hypertension 07/28/2018   nscl ca dx'd 12/2019   Seizures ([facility])    Viral illness 03/23/2019    ALLERGIES:  is allergic to penicillins.  MEDICATIONS:  Current Outpatient Medications  Medication Sig Dispense Refill   albuterol (VENTOLIN HFA) 108 (90 Base) MCG/ACT inhaler Inhale 2 puffs into the lungs every 6 (six) hours as needed for wheezing or shortness of breath. 8 g 2   benzonatate (TESSALON) 100 MG capsule Take 1 capsule (100 mg total) by mouth 3 (three) times daily as needed for cough. (Patient not taking: Reported on 12/04/2021) 30 capsule 2   celecoxib (CELEBREX) 200 MG capsule Take 1 capsule (200 mg total) by mouth 2 (two) times daily. 30 capsule 3   HYDROcodone-acetaminophen (NORCO) 5-325 MG tablet Take 1 tablet by mouth every 6 (six) hours as needed for moderate pain. (Patient not taking: Reported on 01/16/2022) 30 tablet 0   ibuprofen (ADVIL) 800 MG tablet Take 800 mg by mouth 3 (three) times daily.     levETIRAcetam (KEPPRA) 750 MG tablet Take 1 tablet (750 mg total) by mouth 2 (two) times daily. 180 tablet 4   losartan (COZAAR) 25 MG tablet Take 1 tablet (25 mg total) by mouth daily. 90 tablet 3   meloxicam (MOBIC) 7.5 MG tablet Take 1 tablet (7.5 mg total) by mouth daily.  90 tablet 0   nortriptyline (PAMELOR) 25 MG capsule TAKE 2 CAPSULES(50 MG) BY MOUTH AT BEDTIME 60 capsule 5   omeprazole (PRILOSEC) 20 MG capsule Take 1 capsule (20 mg total) by mouth daily. Pt takes in the morning (Patient not taking: Reported on 01/16/2022) 90 capsule 1   potassium chloride SA (KLOR-CON M) 20 MEQ tablet Take 1 tablet (20 mEq total) by mouth daily. (Patient not taking: Reported on 01/16/2022) 7 tablet 0   sildenafil (VIAGRA) 50 MG tablet Take 2 tablets  (100 mg total) by mouth daily as needed for erectile dysfunction. 30 tablet 3   No current facility-administered medications for this visit.    SURGICAL HISTORY:  Past Surgical History:  Procedure Laterality Date   APPLICATION OF CRANIAL NAVIGATION N/A 01/07/2020   Procedure: APPLICATION OF CRANIAL NAVIGATION;  Surgeon: Ashok Pall, MD;  Location: [facility];  Service: Neurosurgery;  Laterality: N/A;   CRANIOTOMY Right 01/07/2020   Procedure: RIGHT CRANIOTOMY FOR TUMOR RESECTION;  Surgeon: Ashok Pall, MD;  Location: [facility];  Service: Neurosurgery;  Laterality: Right;  rm 21   ENDOBRONCHIAL ULTRASOUND N/A 12/21/2019   Procedure: ENDOBRONCHIAL ULTRASOUND;  Surgeon: Laurin Coder, MD;  Location: [facility];  Service: Pulmonary;  Laterality: N/A;   FINE NEEDLE ASPIRATION  12/21/2019   Procedure: FINE NEEDLE ASPIRATION (FNA) LINEAR;  Surgeon: Laurin Coder, MD;  Location: [facility];  Service: Pulmonary;;   IR IMAGING GUIDED PORT INSERTION  03/03/2020   NO PAST SURGERIES     VIDEO BRONCHOSCOPY N/A 12/21/2019   Procedure: VIDEO BRONCHOSCOPY WITHOUT FLUORO;  Surgeon: Laurin Coder, MD;  Location: [facility];  Service: Pulmonary;  Laterality: N/A;    REVIEW OF SYSTEMS:  A comprehensive review of systems was negative except for: Musculoskeletal: positive for arthralgias   PHYSICAL EXAMINATION: General appearance: alert, cooperative, appears older than stated age, and no distress Head: Normocephalic, without obvious abnormality, atraumatic Neck: no adenopathy, no JVD, supple, symmetrical, trachea midline, and thyroid not enlarged, symmetric, no tenderness/mass/nodules Lymph nodes: Cervical, supraclavicular, and axillary nodes normal. Resp: clear to auscultation bilaterally Back: symmetric, no curvature. ROM normal. No CVA tenderness. Cardio: regular rate and rhythm, S1, S2 normal, no murmur, click, rub or gallop GI: soft, non-tender; bowel sounds normal; no masses,  no  organomegaly Extremities: extremities normal, atraumatic, no cyanosis or edema  ECOG PERFORMANCE STATUS: 1 - Symptomatic but completely ambulatory  Blood pressure (!) 158/84, pulse 77, temperature (!) 97 F (36.1 C), temperature source Oral, resp. rate 17, weight 184 lb 7 oz (83.7 kg), SpO2 98 %.  LABORATORY DATA: Lab Results  Component Value Date   WBC 5.9 02/05/2022   HGB 12.4 (L) 02/05/2022   HCT 34.7 (L) 02/05/2022   MCV 83.8 02/05/2022   PLT 257 02/05/2022      Chemistry      Component Value Date/Time   NA 140 01/16/2022 1332   NA 140 11/23/2019 1516   K 3.3 (L) 01/16/2022 1332   CL 109 01/16/2022 1332   CO2 27 01/16/2022 1332   BUN 12 01/16/2022 1332   BUN 14 11/23/2019 1516   CREATININE 0.71 01/16/2022 1332      Component Value Date/Time   CALCIUM 9.0 01/16/2022 1332   ALKPHOS 78 01/16/2022 1332   AST 13 (L) 01/16/2022 1332   ALT 12 01/16/2022 1332   BILITOT 0.8 01/16/2022 1332       RADIOGRAPHIC STUDIES: CT Chest W Contrast  Result Date: 01/11/2022 CLINICAL DATA:  Primary Cancer Type:  Lung Imaging Indication: Assess response to therapy Interval therapy since last imaging? Yes Initial Cancer Diagnosis Date: 12/21/2019; Established by: Biopsy-proven Detailed Pathology: Stage IV non-small cell lung cancer, adenocarcinoma. Primary Tumor location:  Left upper lobe. Surgeries: Right craniotomy for tumor resection. No thoracic. Chemotherapy: Yes; Ongoing? No; Most recent administration: 02/29/2020 Immunotherapy?  Yes; Type: Cempilimab; Ongoing? Yes Radiation therapy? Yes Date Range: 01/25/2020 - 03/14/2020; Target: Left lung Date Range: 04/22/2020; Target: Brain * Tracking Code: BO * EXAM: CT CHEST, ABDOMEN, AND PELVIS WITH CONTRAST TECHNIQUE: Multidetector CT imaging of the chest, abdomen and pelvis was performed following the standard protocol during bolus administration of intravenous contrast. RADIATION DOSE REDUCTION: This exam was performed according to the  departmental dose-optimization program which includes automated exposure control, adjustment of the mA and/or kV according to patient size and/or use of iterative reconstruction technique. CONTRAST:  185m OMNIPAQUE IOHEXOL 300 MG/ML SOLN additional oral enteric contrast COMPARISON:  Most recent CT chest, abdomen and pelvis 11/07/2021. 01/01/2020 PET-CT. FINDINGS: CT CHEST FINDINGS Cardiovascular: Right chest port catheter. Normal heart size. Three-vessel coronary artery calcifications. No pericardial effusion. Mediastinum/Nodes: No enlarged mediastinal, hilar, or axillary lymph nodes. Thyroid gland, trachea, and esophagus demonstrate no significant findings. Lungs/Pleura: Mild, predominantly centrilobular emphysema. Diffuse bilateral bronchial wall thickening. Significant interval reduction in size of a spiculated opacity of the posterior right upper lobe abutting and tenting the major fissure measuring 0.8 x 0.6 cm, previously 1.2 x 1.1 cm (series 6, image 72). Dependent bibasilar scarring and or atelectasis, with new, somewhat nodular appearing foci of consolidation in the dependent medial right lower lobe, measuring up to 1.3 x 1.0 cm (series 6, image 116) and inferiorly in the deep medial costophrenic recess measuring 1.8 x 1.0 cm (series 6, image 135). Bandlike scarring of the medial segment right middle lobe and lingula. No pleural effusion or pneumothorax. Musculoskeletal: No chest wall abnormality. No acute osseous findings. CT ABDOMEN PELVIS FINDINGS Hepatobiliary: No solid liver abnormality is seen. No gallstones, gallbladder wall thickening, or biliary dilatation. Pancreas: Unremarkable. No pancreatic ductal dilatation or surrounding inflammatory changes. Spleen: Normal in size without significant abnormality. Adrenals/Urinary Tract: Adrenal glands are unremarkable. Kidneys are normal, without renal calculi, solid lesion, or hydronephrosis. Bladder is unremarkable. Stomach/Bowel: Stomach is within normal  limits. Appendix appears normal. No evidence of bowel wall thickening, distention, or inflammatory changes. Sigmoid diverticula. Vascular/Lymphatic: Aortic atherosclerosis. No enlarged abdominal or pelvic lymph nodes. Reproductive: Mild prostatomegaly. Other: No abdominal wall hernia or abnormality. No ascites. Musculoskeletal: No acute osseous findings. IMPRESSION: 1. Significant interval reduction in size of a spiculated opacity of the posterior right upper lobe abutting and tenting the major fissure. There are however new nodular appearing foci of consolidation in the dependent right lower lobe. Fluctuating, rapidly evolving findings primarily suggest ongoing nonspecific infection or aspiration. Attention on follow-up. 2. No evidence of lymphadenopathy or metastatic disease within the abdomen or pelvis. 3. Emphysema and diffuse bilateral bronchial wall thickening. 4. Coronary artery disease. Aortic Atherosclerosis (ICD10-I70.0) and Emphysema (ICD10-J43.9). Electronically Signed   By: ADelanna AhmadiM.D.   On: 01/11/2022 10:38   CT Abdomen Pelvis W Contrast  Result Date: 01/11/2022 CLINICAL DATA:  Primary Cancer Type: Lung Imaging Indication: Assess response to therapy Interval therapy since last imaging? Yes Initial Cancer Diagnosis Date: 12/21/2019; Established by: Biopsy-proven Detailed Pathology: Stage IV non-small cell lung cancer, adenocarcinoma. Primary Tumor location:  Left upper lobe. Surgeries: Right craniotomy for tumor resection. No thoracic. Chemotherapy: Yes; Ongoing? No; Most recent administration: 02/29/2020 Immunotherapy?  Yes;  Type: Cempilimab; Ongoing? Yes Radiation therapy? Yes Date Range: 01/25/2020 - 03/14/2020; Target: Left lung Date Range: 04/22/2020; Target: Brain * Tracking Code: BO * EXAM: CT CHEST, ABDOMEN, AND PELVIS WITH CONTRAST TECHNIQUE: Multidetector CT imaging of the chest, abdomen and pelvis was performed following the standard protocol during bolus administration of  intravenous contrast. RADIATION DOSE REDUCTION: This exam was performed according to the departmental dose-optimization program which includes automated exposure control, adjustment of the mA and/or kV according to patient size and/or use of iterative reconstruction technique. CONTRAST:  141m OMNIPAQUE IOHEXOL 300 MG/ML SOLN additional oral enteric contrast COMPARISON:  Most recent CT chest, abdomen and pelvis 11/07/2021. 01/01/2020 PET-CT. FINDINGS: CT CHEST FINDINGS Cardiovascular: Right chest port catheter. Normal heart size. Three-vessel coronary artery calcifications. No pericardial effusion. Mediastinum/Nodes: No enlarged mediastinal, hilar, or axillary lymph nodes. Thyroid gland, trachea, and esophagus demonstrate no significant findings. Lungs/Pleura: Mild, predominantly centrilobular emphysema. Diffuse bilateral bronchial wall thickening. Significant interval reduction in size of a spiculated opacity of the posterior right upper lobe abutting and tenting the major fissure measuring 0.8 x 0.6 cm, previously 1.2 x 1.1 cm (series 6, image 72). Dependent bibasilar scarring and or atelectasis, with new, somewhat nodular appearing foci of consolidation in the dependent medial right lower lobe, measuring up to 1.3 x 1.0 cm (series 6, image 116) and inferiorly in the deep medial costophrenic recess measuring 1.8 x 1.0 cm (series 6, image 135). Bandlike scarring of the medial segment right middle lobe and lingula. No pleural effusion or pneumothorax. Musculoskeletal: No chest wall abnormality. No acute osseous findings. CT ABDOMEN PELVIS FINDINGS Hepatobiliary: No solid liver abnormality is seen. No gallstones, gallbladder wall thickening, or biliary dilatation. Pancreas: Unremarkable. No pancreatic ductal dilatation or surrounding inflammatory changes. Spleen: Normal in size without significant abnormality. Adrenals/Urinary Tract: Adrenal glands are unremarkable. Kidneys are normal, without renal calculi, solid  lesion, or hydronephrosis. Bladder is unremarkable. Stomach/Bowel: Stomach is within normal limits. Appendix appears normal. No evidence of bowel wall thickening, distention, or inflammatory changes. Sigmoid diverticula. Vascular/Lymphatic: Aortic atherosclerosis. No enlarged abdominal or pelvic lymph nodes. Reproductive: Mild prostatomegaly. Other: No abdominal wall hernia or abnormality. No ascites. Musculoskeletal: No acute osseous findings. IMPRESSION: 1. Significant interval reduction in size of a spiculated opacity of the posterior right upper lobe abutting and tenting the major fissure. There are however new nodular appearing foci of consolidation in the dependent right lower lobe. Fluctuating, rapidly evolving findings primarily suggest ongoing nonspecific infection or aspiration. Attention on follow-up. 2. No evidence of lymphadenopathy or metastatic disease within the abdomen or pelvis. 3. Emphysema and diffuse bilateral bronchial wall thickening. 4. Coronary artery disease. Aortic Atherosclerosis (ICD10-I70.0) and Emphysema (ICD10-J43.9). Electronically Signed   By: ADelanna AhmadiM.D.   On: 01/11/2022 10:38     ASSESSMENT AND PLAN: This is a very pleasant 66years old African-American male recently diagnosed with a stage IV (TX, N2, M1c) non-small cell lung cancer, adenocarcinoma presented with solitary brain metastasis in addition to right hilar and mediastinal lymphadenopathy diagnosed in August 2021.  The patient is status post right craniotomy with resection of the solitary brain metastasis with SRS. His PET scan showed no evidence of metastatic disease outside the chest. The patient was treated with a course of concurrent chemoradiation with weekly carboplatin and paclitaxel status post 5 cycles.  He tolerated his treatment well except for dysphagia and odynophagia. His PD-L1 expression is 70%. The patient has been on treatment with immunotherapy with Libtayo (Cempilimab) 350 Mg IV every 3  weeks status post 31 cycles.  The patient has been tolerating this treatment well with no concerning adverse effects. I recommended for him to proceed with cycle #32 today as planned. I will see him back for follow-up visit in 3 weeks for evaluation before starting cycle #33. He was advised to call immediately if he has any other concerning symptoms in the interval. The patient voices understanding of current disease status and treatment options and is in agreement with the current care plan.  All questions were answered. The patient knows to call the clinic with any problems, questions or concerns. We can certainly see the patient much sooner if necessary.  The total time spent in the appointment was 20 minutes.  Disclaimer: This note was dictated with voice recognition software. Similar sounding words can inadvertently be transcribed and may not be corrected upon review.

## 2022-02-07 ENCOUNTER — Ambulatory Visit: Payer: Medicare HMO

## 2022-02-07 DIAGNOSIS — M25562 Pain in left knee: Secondary | ICD-10-CM | POA: Diagnosis not present

## 2022-02-07 DIAGNOSIS — G8929 Other chronic pain: Secondary | ICD-10-CM

## 2022-02-07 DIAGNOSIS — M6281 Muscle weakness (generalized): Secondary | ICD-10-CM

## 2022-02-07 LAB — T4: T4, Total: 12.6 ug/dL — ABNORMAL HIGH (ref 4.5–12.0)

## 2022-02-07 NOTE — Therapy (Signed)
OUTPATIENT PHYSICAL THERAPY TREATMENT NOTE   Patient Name: Jared Shea. MRN: [id number] DOB:August 06, 1955, 66 y.o., male Today's Date: 02/07/2022  PCP: [name], DO   REFERRING PROVIDER: Leandrew Koyanagi, MD   END OF SESSION:   PT End of Session - 02/07/22 0930     Visit Number 8    Number of Visits 13    Date for PT Re-Evaluation 02/24/22    Authorization Type [employer]    Authorization Time Period 01/17/2022 - 03/03/2022    Authorization - Visit Number 7    Authorization - Number of Visits 12    PT Start Time 0930    PT Stop Time 1012    PT Time Calculation (min) 42 min    Activity Tolerance Patient tolerated treatment well    Behavior During Therapy [facility] for tasks assessed/performed                   Past Medical History:  Diagnosis Date   Brain mass    Bursitis of right hip    Chest pain 08/12/2018   Chronic cough    Dependence on nicotine from cigarettes    Diabetes mellitus without complication ([place])    Essential hypertension 07/28/2018   nscl ca dx'd 12/2019   Seizures ([place])    Viral illness 03/23/2019   Past Surgical History:  Procedure Laterality Date   APPLICATION OF CRANIAL NAVIGATION N/A 01/07/2020   Procedure: APPLICATION OF CRANIAL NAVIGATION;  Surgeon: Ashok Pall, MD;  Location: [place];  Service: Neurosurgery;  Laterality: N/A;   CRANIOTOMY Right 01/07/2020   Procedure: RIGHT CRANIOTOMY FOR TUMOR RESECTION;  Surgeon: Ashok Pall, MD;  Location: [place];  Service: Neurosurgery;  Laterality: Right;  rm 21   ENDOBRONCHIAL ULTRASOUND N/A 12/21/2019   Procedure: ENDOBRONCHIAL ULTRASOUND;  Surgeon: Laurin Coder, MD;  [facility];  Service: Pulmonary;  Laterality: N/A;   FINE NEEDLE ASPIRATION  12/21/2019   Procedure: FINE NEEDLE ASPIRATION (FNA) LINEAR;  Surgeon: Laurin Coder, MD;  [facility];  Service: Pulmonary;;   IR IMAGING GUIDED PORT INSERTION  03/03/2020   NO PAST SURGERIES     VIDEO BRONCHOSCOPY N/A 12/21/2019    Procedure: VIDEO BRONCHOSCOPY WITHOUT FLUORO;  Surgeon: Laurin Coder, MD;  [facility];  Service: Pulmonary;  Laterality: N/A;   Patient Active Problem List   Diagnosis Date Noted   Bilateral primary osteoarthritis of knee 12/06/2021   Chills 09/08/2021   Primary osteoarthritis of right knee 08/15/2021   Primary osteoarthritis of left knee 08/15/2021   Erectile dysfunction 07/11/2021   Knee pain 07/11/2021   Dysuria 04/03/2021   Vision changes 03/14/2021   Memory loss 03/14/2021   Port-A-Cath in place 07/05/2020   Frequent headaches 06/27/2020   Polyarthralgia 06/15/2020   Headache 05/09/2020   Encounter for antineoplastic immunotherapy 04/18/2020   Goals of care, counseling/discussion 04/18/2020   Adenocarcinoma, metastatic ([place]) 01/07/2020   Adjustment disorder with depressed mood 01/06/2020   Thyroid nodule 01/04/2020   Non-small cell carcinoma of lung, stage 4 w/ mets to brain ([place]) 12/31/2019   Partial symptomatic epilepsy with complex partial seizures, not intractable, without status epilepticus ([place]) 12/23/2019   Malignant neoplasm metastatic to brain ([place]) 12/23/2019   Focal seizures ([place]) 12/09/2019   Healthcare maintenance 12/24/2018   Essential hypertension 07/28/2018    REFERRING DIAG: Chronic pain of both knees  THERAPY DIAG:  Chronic pain of left knee  Chronic pain of right knee  Muscle weakness (generalized)  Rationale  for Evaluation and Treatment Rehabilitation  PERTINENT HISTORY: Stage IV non-small cell lung cancer, adenocarcinoma with solitary brain metastasis in addition to right hilar and mediastinal lymphadenopathy diagnosed in August 2021 currently being treated. Seizures   PRECAUTIONS: Active cancer   SUBJECTIVE: Patient reports he is feeling better and isn't having pain right now.   PAIN:  Are you having pain? None currently;  at worst NPRS scale: 8/10 Pain location: Bilateral knees  Pain description: Sore  Aggravating  factors: when transition from sitting to standing Relieving factors: cortisone injection  PATIENT GOALS "get back on track, where my knees don't hurt."    OBJECTIVE: (objective measures completed at initial evaluation unless otherwise dated) PATIENT SURVEYS:  FOTO 51% function to 71% predicted   01/31/2022: 63%   MUSCLE LENGTH: Hamstrings: Right lacking 40 deg; Left lacking 35 deg 01/22/22:Hamstring: Rt lacking 40 deg; Lt lacking 35 deg        01/31/22:Hamstring: Rt lacking 25 deg; Lt lacking 25 deg     POSTURE: rounded shoulders and forward head   PALPATION: TTP bilateral inferior pole of patella, fat pad, medial femoral condyle    LOWER EXTREMITY ROM:   Active ROM Right eval Left eval Rt / Lt 01/31/2022  Hip flexion       Hip extension       Hip abduction       Hip adduction       Hip internal rotation       Hip external rotation       Knee flexion 100 pn 109 pn 130 / 130  Knee extension WNL WNL   Ankle dorsiflexion Lacking 2 Lacking 5 0 / 0  Ankle plantarflexion       Ankle inversion       Ankle eversion        (Blank rows = not tested)   LOWER EXTREMITY MMT:   MMT Right eval Left eval Rt / Lt 01/29/2022  Hip flexion 4- 4- 4- / 4-  Hip extension 4- 4- 4- / 4-  Hip abduction 4- 3+ 4- / 4-  Hip adduction       Hip internal rotation       Hip external rotation       Knee flexion 4+ 4+ 4+ / 4+  Knee extension 4 pn 4 pn 4 / 4  Ankle dorsiflexion       Ankle plantarflexion       Ankle inversion       Ankle eversion        (Blank rows = not tested)   LOWER EXTREMITY SPECIAL TESTS:  Valgus stress (- bilateral)  Varus stress (- bilateral)  Anterior drawer (- bilateral)  Posterior drawer (- bilateral)  Clarke's sign (+ bilateral)  McMurray's (remains guarded)  Ely's (+ bilateral)    FUNCTIONAL TESTS:  5 times sit to stand: 24 seconds 02/07/22: 14.9 seconds    GAIT: Distance walked: 15 ft  Assistive device utilized: None Level of assistance: Complete  Independence Comments: excessive pronation LLE, lateral trunk lean, WBOS       TODAY'S TREATMENT: [facility] Adult PT Treatment:                                                DATE: 02/07/22 Therapeutic Exercise: NuStep level 6 x 5 minutes LE/UE  Prone quad stretch x 60 sec  each  Seated HS stretch 2 x 30 sec each  Wall squats 2 x 10 TRX squats 2 x 10  Resisted HS curl 3 x 10 @ 35 lbs  Lateral step downs 2 x 10; 2 inch step (single UE support for balance)  Hip bridge 2 x 15  [facility] Adult PT Treatment:                                                DATE: 02/05/22 Therapeutic Exercise: NuStep level 6 x 5 minutes UE/LE  Prone quad stretch x 60 sec each  Seated HS stretch 2 x 30 sec each  Sit to stand normal height no UE support 2 trials unable to control  SL eccentric leg press 3 x 10; 20 lbs  Lateral step downs 2 inch 2 x 10  Sidelying hip abduction 2 x 15    [facility] Adult PT Treatment:                                                DATE: 01/31/22 Therapeutic Exercise: NuStep L6 x 5 min with UE/LE while taking subjective Seated hamstring stretch 2 x 30 sec each SLR 2 x 10 each Bridge 2 x 10 Sit to stand from elevated table without UE support 2 x 10 Standing hip abduction with green at knees 2 x 10 each Standing heel raises 2 x 10 Knee extension machine 20# 2 x 10 Knee flexion machine 35# 2 x 10 Leg press (BATCA) 55# 3 x 10 Forward 8" step-up 2 x 10 each    PATIENT EDUCATION:  Education details: N/A  Person educated: N/A Education method: N/A Education comprehension: N/A   HOME EXERCISE PROGRAM: Access Code: [email or web address]    ASSESSMENT: CLINICAL IMPRESSION: Patient arrives without reports of knee pain. Continued focus on closed chain strengthening, which he tolerated well. With squatting activity he has initially difficulty allowing for hip flexion, but with tactile and verbal cues he is able to correct.He remains challenged with step downs, having difficulty controlling the descent.  No reports of knee pain throughout session. His 5 x STS has significantly improved having met this long term functional goal.      OBJECTIVE IMPAIRMENTS Abnormal gait, decreased activity tolerance, decreased endurance, decreased knowledge of condition, difficulty walking, decreased ROM, decreased strength, impaired flexibility, improper body mechanics, postural dysfunction, and pain.    ACTIVITY LIMITATIONS carrying, lifting, bending, sitting, standing, squatting, stairs, and locomotion level   PARTICIPATION LIMITATIONS: cleaning, shopping, community activity, and yard work   PERSONAL FACTORS Age, Fitness, Time since onset of injury/illness/exacerbation, and 3+ comorbidities: active cancer undergoing treatment, diabetes, seizures  are also affecting patient's functional outcome.      GOALS: Goals reviewed with patient? Yes   SHORT TERM GOALS: Target date: 01/29/22  Patient will be independent and compliant with initial HEP.  Baseline: issued at eval  01/31/2022: independent Goal status: MET   2.  Patient will improve bilateral hamstring length by at least 10 degrees and bilateral ankle DF by at least 5 degrees to reduce stress about the knees with closed chain activity.  Baseline: see above  01/31/2022: see above Goal status: PARTIALLY MET   3.  Patient will demonstrate at least 115  degrees of bilateral knee flexion AROM to improve ability to complete sit<>stand transfer.  Baseline: see above 01/31/2022: 130 deg bilat Goal status: MET     LONG TERM GOALS: Target date:02/19/22   Patient will demonstrate 5/5 bilateral knee strength to improve ability to complete stair and curb negotiation.  Baseline: see above Goal status: INITIAL   2.  Patient will demonstrate at least 4+/5 bilateral hip strength to improve stability about the chain with prolonged walking and standing activity.  Baseline: see above  Goal status: INITIAL   3.  Patient will complete 5 x STS in </= 18 seconds to  improve his ease of transfers.  Baseline: see above  Goal status: met    4.  Patient will report pain at worst rated as </= 6/10 to signify improvements in his current condition.  Baseline: 10/10 at worst  Goal status: INITIAL     PLAN: PT FREQUENCY: 2x/week   PT DURATION: 6 weeks   PLANNED INTERVENTIONS: Therapeutic exercises, Therapeutic activity, Neuromuscular re-education, Balance training, Gait training, Patient/Family education, Self Care, Joint mobilization, Stair training, Dry Needling, Cryotherapy, Taping, Manual therapy, and Re-evaluation   PLAN FOR NEXT SESSION: review and progress HEP prn; hip and knee stretching/strengthening progressing to CKC as tolerated   Gwendolyn Grant, PT, DPT, ATC 02/07/22 10:12 AM

## 2022-02-12 ENCOUNTER — Ambulatory Visit: Payer: Medicare HMO | Attending: Orthopaedic Surgery

## 2022-02-12 DIAGNOSIS — M6281 Muscle weakness (generalized): Secondary | ICD-10-CM | POA: Insufficient documentation

## 2022-02-12 DIAGNOSIS — M25562 Pain in left knee: Secondary | ICD-10-CM | POA: Insufficient documentation

## 2022-02-12 DIAGNOSIS — G8929 Other chronic pain: Secondary | ICD-10-CM | POA: Insufficient documentation

## 2022-02-12 DIAGNOSIS — M25561 Pain in right knee: Secondary | ICD-10-CM | POA: Insufficient documentation

## 2022-02-12 NOTE — Therapy (Signed)
OUTPATIENT PHYSICAL THERAPY TREATMENT NOTE   Patient Name: Jared Shea. MRN: [id number] DOB:05-Nov-1955, 66 y.o., male Today's Date: 02/12/2022  PCP: [name], DO   REFERRING PROVIDER: Leandrew Koyanagi, MD   END OF SESSION:   PT End of Session - 02/12/22 1015     Visit Number 9    Number of Visits 13    Date for PT Re-Evaluation 02/24/22    Authorization Type [employer]    Authorization Time Period 01/17/2022 - 03/03/2022    Authorization - Visit Number 8    Authorization - Number of Visits 12    PT Start Time 0355    PT Stop Time 1059    PT Time Calculation (min) 44 min    Activity Tolerance Patient tolerated treatment well    Behavior During Therapy [facility] for tasks assessed/performed                    Past Medical History:  Diagnosis Date   Brain mass    Bursitis of right hip    Chest pain 08/12/2018   Chronic cough    Dependence on nicotine from cigarettes    Diabetes mellitus without complication ([place])    Essential hypertension 07/28/2018   nscl ca dx'd 12/2019   Seizures ([place])    Viral illness 03/23/2019   Past Surgical History:  Procedure Laterality Date   APPLICATION OF CRANIAL NAVIGATION N/A 01/07/2020   Procedure: APPLICATION OF CRANIAL NAVIGATION;  Surgeon: Ashok Pall, MD;  Location: [place];  Service: Neurosurgery;  Laterality: N/A;   CRANIOTOMY Right 01/07/2020   Procedure: RIGHT CRANIOTOMY FOR TUMOR RESECTION;  Surgeon: Ashok Pall, MD;  Location: [place];  Service: Neurosurgery;  Laterality: Right;  rm 21   ENDOBRONCHIAL ULTRASOUND N/A 12/21/2019   Procedure: ENDOBRONCHIAL ULTRASOUND;  Surgeon: Laurin Coder, MD;  [facility];  Service: Pulmonary;  Laterality: N/A;   FINE NEEDLE ASPIRATION  12/21/2019   Procedure: FINE NEEDLE ASPIRATION (FNA) LINEAR;  Surgeon: Laurin Coder, MD;  [facility];  Service: Pulmonary;;   IR IMAGING GUIDED PORT INSERTION  03/03/2020   NO PAST SURGERIES     VIDEO BRONCHOSCOPY N/A 12/21/2019    Procedure: VIDEO BRONCHOSCOPY WITHOUT FLUORO;  Surgeon: Laurin Coder, MD;  [facility];  Service: Pulmonary;  Laterality: N/A;   Patient Active Problem List   Diagnosis Date Noted   Bilateral primary osteoarthritis of knee 12/06/2021   Chills 09/08/2021   Primary osteoarthritis of right knee 08/15/2021   Primary osteoarthritis of left knee 08/15/2021   Erectile dysfunction 07/11/2021   Knee pain 07/11/2021   Dysuria 04/03/2021   Vision changes 03/14/2021   Memory loss 03/14/2021   Port-A-Cath in place 07/05/2020   Frequent headaches 06/27/2020   Polyarthralgia 06/15/2020   Headache 05/09/2020   Encounter for antineoplastic immunotherapy 04/18/2020   Goals of care, counseling/discussion 04/18/2020   Adenocarcinoma, metastatic ([place]) 01/07/2020   Adjustment disorder with depressed mood 01/06/2020   Thyroid nodule 01/04/2020   Non-small cell carcinoma of lung, stage 4 w/ mets to brain ([place]) 12/31/2019   Partial symptomatic epilepsy with complex partial seizures, not intractable, without status epilepticus ([place]) 12/23/2019   Malignant neoplasm metastatic to brain ([place]) 12/23/2019   Focal seizures ([place]) 12/09/2019   Healthcare maintenance 12/24/2018   Essential hypertension 07/28/2018    REFERRING DIAG: Chronic pain of both knees  THERAPY DIAG:  Chronic pain of left knee  Chronic pain of right knee  Muscle weakness (generalized)  Rationale for Evaluation and Treatment Rehabilitation  PERTINENT HISTORY: Stage IV non-small cell lung cancer, adenocarcinoma with solitary brain metastasis in addition to right hilar and mediastinal lymphadenopathy diagnosed in August 2021 currently being treated. Seizures   PRECAUTIONS: Active cancer   SUBJECTIVE: Patient reports he has "good days and bad days" as it relates to his knee pain. They are hurting today.   PAIN:  Are you having pain? Yes NPRS scale: 8/10 Pain location: Bilateral knees  Pain description: Sore   Aggravating factors: when transition from sitting to standing Relieving factors: cortisone injection  PATIENT GOALS "get back on track, where my knees don't hurt."    OBJECTIVE: (objective measures completed at initial evaluation unless otherwise dated) PATIENT SURVEYS:  FOTO 51% function to 71% predicted   01/31/2022: 63%   MUSCLE LENGTH: Hamstrings: Right lacking 40 deg; Left lacking 35 deg 01/22/22:Hamstring: Rt lacking 40 deg; Lt lacking 35 deg        01/31/22:Hamstring: Rt lacking 25 deg; Lt lacking 25 deg     POSTURE: rounded shoulders and forward head   PALPATION: TTP bilateral inferior pole of patella, fat pad, medial femoral condyle    LOWER EXTREMITY ROM:   Active ROM Right eval Left eval Rt / Lt 01/31/2022  Hip flexion       Hip extension       Hip abduction       Hip adduction       Hip internal rotation       Hip external rotation       Knee flexion 100 pn 109 pn 130 / 130  Knee extension WNL WNL   Ankle dorsiflexion Lacking 2 Lacking 5 0 / 0  Ankle plantarflexion       Ankle inversion       Ankle eversion        (Blank rows = not tested)   LOWER EXTREMITY MMT:   MMT Right eval Left eval Rt / Lt 01/29/2022  Hip flexion 4- 4- 4- / 4-  Hip extension 4- 4- 4- / 4-  Hip abduction 4- 3+ 4- / 4-  Hip adduction       Hip internal rotation       Hip external rotation       Knee flexion 4+ 4+ 4+ / 4+  Knee extension 4 pn 4 pn 4 / 4  Ankle dorsiflexion       Ankle plantarflexion       Ankle inversion       Ankle eversion        (Blank rows = not tested)   LOWER EXTREMITY SPECIAL TESTS:  Valgus stress (- bilateral)  Varus stress (- bilateral)  Anterior drawer (- bilateral)  Posterior drawer (- bilateral)  Clarke's sign (+ bilateral)  McMurray's (remains guarded)  Ely's (+ bilateral)    FUNCTIONAL TESTS:  5 times sit to stand: 24 seconds 02/07/22: 14.9 seconds    GAIT: Distance walked: 15 ft  Assistive device utilized: None Level of  assistance: Complete Independence Comments: excessive pronation LLE, lateral trunk lean, WBOS       TODAY'S TREATMENT: [facility] Adult PT Treatment:                                                DATE: 02/12/22 Therapeutic Exercise: NuStep level 7 x 5 minutes UE/LE  SL  leg press 2 x 10 @ 40 lbs on RLE; 30 lbs on LLE Lateral band walk 3 sets d/b at counter; green band at shins  Bodyweight squats with BUE support 2 x 10  Sidelying hip abduction 2 x 15  Updated HEP     [facility] Adult PT Treatment:                                                DATE: 02/07/22 Therapeutic Exercise: NuStep level 6 x 5 minutes LE/UE  Prone quad stretch x 60 sec each  Seated HS stretch 2 x 30 sec each  Wall squats 2 x 10 TRX squats 2 x 10  Resisted HS curl 3 x 10 @ 35 lbs  Lateral step downs 2 x 10; 2 inch step (single UE support for balance)  Hip bridge 2 x 15  [facility] Adult PT Treatment:                                                DATE: 02/05/22 Therapeutic Exercise: NuStep level 6 x 5 minutes UE/LE  Prone quad stretch x 60 sec each  Seated HS stretch 2 x 30 sec each  Sit to stand normal height no UE support 2 trials unable to control  SL eccentric leg press 3 x 10; 20 lbs  Lateral step downs 2 inch 2 x 10  Sidelying hip abduction 2 x 15     PATIENT EDUCATION:  Education details: HEP  Person educated: patient Education method: Systems developer, cues, handout Education comprehension: returned demo, cues, needs further instruction    HOME EXERCISE PROGRAM: Access Code: [email or web address]    ASSESSMENT: CLINICAL IMPRESSION: Patient arrives with high pain levels about bilateral knees, but in NAD. Continued progression of CKC strengthening with overall good tolerance. He is challenged with SL leg press having more difficulty performing on the LLE compared to RLE. With squatting he requires initial cues to allow for knee flexion with ability to correct with tactile and verbal cueing. HEP updated to include CKC strengthening.  Patient reported abolishment of knee pain at conclusion of session.      OBJECTIVE IMPAIRMENTS Abnormal gait, decreased activity tolerance, decreased endurance, decreased knowledge of condition, difficulty walking, decreased ROM, decreased strength, impaired flexibility, improper body mechanics, postural dysfunction, and pain.    ACTIVITY LIMITATIONS carrying, lifting, bending, sitting, standing, squatting, stairs, and locomotion level   PARTICIPATION LIMITATIONS: cleaning, shopping, community activity, and yard work   PERSONAL FACTORS Age, Fitness, Time since onset of injury/illness/exacerbation, and 3+ comorbidities: active cancer undergoing treatment, diabetes, seizures  are also affecting patient's functional outcome.      GOALS: Goals reviewed with patient? Yes   SHORT TERM GOALS: Target date: 01/29/22  Patient will be independent and compliant with initial HEP.  Baseline: issued at eval  01/31/2022: independent Goal status: MET   2.  Patient will improve bilateral hamstring length by at least 10 degrees and bilateral ankle DF by at least 5 degrees to reduce stress about the knees with closed chain activity.  Baseline: see above  01/31/2022: see above Goal status: PARTIALLY MET   3.  Patient will demonstrate at least 115 degrees of bilateral knee flexion AROM to improve ability to complete  sit<>stand transfer.  Baseline: see above 01/31/2022: 130 deg bilat Goal status: MET     LONG TERM GOALS: Target date:02/19/22   Patient will demonstrate 5/5 bilateral knee strength to improve ability to complete stair and curb negotiation.  Baseline: see above Goal status: INITIAL   2.  Patient will demonstrate at least 4+/5 bilateral hip strength to improve stability about the chain with prolonged walking and standing activity.  Baseline: see above  Goal status: INITIAL   3.  Patient will complete 5 x STS in </= 18 seconds to improve his ease of transfers.  Baseline: see above  Goal  status: met    4.  Patient will report pain at worst rated as </= 6/10 to signify improvements in his current condition.  Baseline: 10/10 at worst  Goal status: INITIAL     PLAN: PT FREQUENCY: 2x/week   PT DURATION: 6 weeks   PLANNED INTERVENTIONS: Therapeutic exercises, Therapeutic activity, Neuromuscular re-education, Balance training, Gait training, Patient/Family education, Self Care, Joint mobilization, Stair training, Dry Needling, Cryotherapy, Taping, Manual therapy, and Re-evaluation   PLAN FOR NEXT SESSION: review and progress HEP prn; hip and knee stretching/strengthening progressing to CKC as tolerated   Gwendolyn Grant, PT, DPT, ATC 02/12/22 10:59 AM

## 2022-02-13 NOTE — Therapy (Signed)
OUTPATIENT PHYSICAL THERAPY TREATMENT NOTE  Progress Note Reporting Period 01/08/22 to 02/14/22  See note below for Objective Data and Assessment of Progress/Goals.     Patient Name: Jared Shea. MRN: [id number] DOB:1955-12-18, 66 y.o., male Today's Date: 02/14/2022  PCP: [name], DO   REFERRING PROVIDER: Leandrew Koyanagi, MD   END OF SESSION:   PT End of Session - 02/14/22 0912     Visit Number 10    Number of Visits 13    Date for PT Re-Evaluation 02/24/22    Authorization Type [employer]    Authorization Time Period 01/17/2022 - 03/03/2022    Authorization - Visit Number 9    Authorization - Number of Visits 12    PT Start Time 0913    PT Stop Time 0958    PT Time Calculation (min) 45 min    Activity Tolerance Patient tolerated treatment well    Behavior During Therapy [facility] for tasks assessed/performed                     Past Medical History:  Diagnosis Date   Brain mass    Bursitis of right hip    Chest pain 08/12/2018   Chronic cough    Dependence on nicotine from cigarettes    Diabetes mellitus without complication ([place])    Essential hypertension 07/28/2018   nscl ca dx'd 12/2019   Seizures ([place])    Viral illness 03/23/2019   Past Surgical History:  Procedure Laterality Date   APPLICATION OF CRANIAL NAVIGATION N/A 01/07/2020   Procedure: APPLICATION OF CRANIAL NAVIGATION;  Surgeon: Ashok Pall, MD;  Location: [place];  Service: Neurosurgery;  Laterality: N/A;   CRANIOTOMY Right 01/07/2020   Procedure: RIGHT CRANIOTOMY FOR TUMOR RESECTION;  Surgeon: Ashok Pall, MD;  Location: [place];  Service: Neurosurgery;  Laterality: Right;  rm 21   ENDOBRONCHIAL ULTRASOUND N/A 12/21/2019   Procedure: ENDOBRONCHIAL ULTRASOUND;  Surgeon: Laurin Coder, MD;  [facility];  Service: Pulmonary;  Laterality: N/A;   FINE NEEDLE ASPIRATION  12/21/2019   Procedure: FINE NEEDLE ASPIRATION (FNA) LINEAR;  Surgeon: Laurin Coder, MD;  [facility];  Service: Pulmonary;;   IR IMAGING GUIDED PORT INSERTION  03/03/2020   NO PAST SURGERIES     VIDEO BRONCHOSCOPY N/A 12/21/2019   Procedure: VIDEO BRONCHOSCOPY WITHOUT FLUORO;  Surgeon: Laurin Coder, MD;  [facility];  Service: Pulmonary;  Laterality: N/A;   Patient Active Problem List   Diagnosis Date Noted   Bilateral primary osteoarthritis of knee 12/06/2021   Chills 09/08/2021   Primary osteoarthritis of right knee 08/15/2021   Primary osteoarthritis of left knee 08/15/2021   Erectile dysfunction 07/11/2021   Knee pain 07/11/2021   Dysuria 04/03/2021   Vision changes 03/14/2021   Memory loss 03/14/2021   Port-A-Cath in place 07/05/2020   Frequent headaches 06/27/2020   Polyarthralgia 06/15/2020   Headache 05/09/2020   Encounter for antineoplastic immunotherapy 04/18/2020   Goals of care, counseling/discussion 04/18/2020   Adenocarcinoma, metastatic ([place]) 01/07/2020   Adjustment disorder with depressed mood 01/06/2020   Thyroid nodule 01/04/2020   Non-small cell carcinoma of lung, stage 4 w/ mets to brain ([place]) 12/31/2019   Partial symptomatic epilepsy with complex partial seizures, not intractable, without status epilepticus ([place]) 12/23/2019   Malignant neoplasm metastatic to brain ([place]) 12/23/2019   Focal seizures ([place]) 12/09/2019   Healthcare maintenance 12/24/2018   Essential hypertension 07/28/2018    REFERRING DIAG: Chronic pain of  both knees  THERAPY DIAG:  Chronic pain of left knee  Chronic pain of right knee  Muscle weakness (generalized)  Rationale for Evaluation and Treatment Rehabilitation  PERTINENT HISTORY: Stage IV non-small cell lung cancer, adenocarcinoma with solitary brain metastasis in addition to right hilar and mediastinal lymphadenopathy diagnosed in August 2021 currently being treated. Seizures   PRECAUTIONS: Active cancer   SUBJECTIVE: "They are hurting today."   PAIN:  Are you having pain? Yes NPRS scale:  9/10 Pain location: Bilateral knees  Pain description: Sore  Aggravating factors: when transition from sitting to standing Relieving factors: cortisone injection  PATIENT GOALS "get back on track, where my knees don't hurt."    OBJECTIVE: (objective measures completed at initial evaluation unless otherwise dated) PATIENT SURVEYS:  FOTO 51% function to 71% predicted   01/31/2022: 63% 02/14/22: 50% function    MUSCLE LENGTH: Hamstrings: Right lacking 40 deg; Left lacking 35 deg 01/22/22:Hamstring: Rt lacking 40 deg; Lt lacking 35 deg        01/31/22:Hamstring: Rt lacking 25 deg; Lt lacking 25 deg     POSTURE: rounded shoulders and forward head   PALPATION: TTP bilateral inferior pole of patella, fat pad, medial femoral condyle    LOWER EXTREMITY ROM:   Active ROM Right eval Left eval Rt / Lt 01/31/2022 02/14/22  Hip flexion        Hip extension        Hip abduction        Hip adduction        Hip internal rotation        Hip external rotation        Knee flexion 100 pn 109 pn 130 / 130   Knee extension WNL WNL    Ankle dorsiflexion Lacking 2 Lacking 5 0 / 0 Lt 3; Rt 3  Ankle plantarflexion        Ankle inversion        Ankle eversion         (Blank rows = not tested)   LOWER EXTREMITY MMT:   MMT Right eval Left eval Rt / Lt 01/29/2022 02/14/22  Hip flexion 4- 4- 4- / 4- 4+/5 bilateral   Hip extension 4- 4- 4- / 4- Lt: 4+/5; Rt: 4-/5   Hip abduction 4- 3+ 4- / 4- 4-/5 bilateral   Hip adduction        Hip internal rotation        Hip external rotation        Knee flexion 4+ 4+ 4+ / 4+ 5/5 bilateral  Knee extension 4 pn 4 pn 4 / 4 5/5 bilateral   Ankle dorsiflexion        Ankle plantarflexion        Ankle inversion        Ankle eversion         (Blank rows = not tested)   LOWER EXTREMITY SPECIAL TESTS:  Valgus stress (- bilateral)  Varus stress (- bilateral)  Anterior drawer (- bilateral)  Posterior drawer (- bilateral)  Clarke's sign (+ bilateral)   McMurray's (remains guarded)  Ely's (+ bilateral)    FUNCTIONAL TESTS:  5 times sit to stand: 24 seconds 02/07/22: 14.9 seconds    GAIT: Distance walked: 15 ft  Assistive device utilized: None Level of assistance: Complete Independence Comments: excessive pronation LLE, lateral trunk lean, WBOS       TODAY'S TREATMENT: [facility] Adult PT Treatment:  DATE: 02/14/22 Therapeutic Exercise: Lateral step downs 2 inch step 2 x 10 NuStep level 7 x 5 minutes UE/LE  Forward step downs 2 inch step 2 x 10  Prone hip extension 2 x 15  Cybex hip flexion 2 x 10 @ 17.5 lbs   Therapeutic Activity: Re-assessment to determine overall progress, educating patient on progress towards goals/functional improvement     [facility] Adult PT Treatment:                                                DATE: 02/12/22 Therapeutic Exercise: NuStep level 7 x 5 minutes UE/LE  SL leg press 2 x 10 @ 40 lbs on RLE; 30 lbs on LLE Lateral band walk 3 sets d/b at counter; green band at shins  Bodyweight squats with BUE support 2 x 10  Sidelying hip abduction 2 x 15  Updated HEP     [facility] Adult PT Treatment:                                                DATE: 02/07/22 Therapeutic Exercise: NuStep level 6 x 5 minutes LE/UE  Prone quad stretch x 60 sec each  Seated HS stretch 2 x 30 sec each  Wall squats 2 x 10 TRX squats 2 x 10  Resisted HS curl 3 x 10 @ 35 lbs  Lateral step downs 2 x 10; 2 inch step (single UE support for balance)  Hip bridge 2 x 15      PATIENT EDUCATION:  Education details: see treatment   Person educated: patient Education method: instruction Education comprehension: verbalized understanding    HOME EXERCISE PROGRAM: Access Code: [email or web address]    ASSESSMENT: CLINICAL IMPRESSION: Patient continues to report intermittent high pain levels about bilateral knees, but responds well in session typically reporting a reduction or total abolishment of his  pain at conclusion of PT sessions. He demonstrates overall improvements in knee and ankle AROM, BLE strength, hamstring flexibility, and functional strength as noted through 5 x STS.  He has met all short term functional goals and 2/4 long term functional goals. He will benefit from continuing with current POC to further progress his strength in order to optimize his function and assist in overall pain reduction.      OBJECTIVE IMPAIRMENTS Abnormal gait, decreased activity tolerance, decreased endurance, decreased knowledge of condition, difficulty walking, decreased ROM, decreased strength, impaired flexibility, improper body mechanics, postural dysfunction, and pain.    ACTIVITY LIMITATIONS carrying, lifting, bending, sitting, standing, squatting, stairs, and locomotion level   PARTICIPATION LIMITATIONS: cleaning, shopping, community activity, and yard work   PERSONAL FACTORS Age, Fitness, Time since onset of injury/illness/exacerbation, and 3+ comorbidities: active cancer undergoing treatment, diabetes, seizures  are also affecting patient's functional outcome.      GOALS: Goals reviewed with patient? Yes   SHORT TERM GOALS: Target date: 01/29/22  Patient will be independent and compliant with initial HEP.  Baseline: issued at eval  01/31/2022: independent Goal status: MET   2.  Patient will improve bilateral hamstring length by at least 10 degrees and bilateral ankle DF by at least 5 degrees to reduce stress about the knees with closed chain activity.  Baseline: see above  01/31/2022:  see above 02/14/22: see above  Goal status: MET   3.  Patient will demonstrate at least 115 degrees of bilateral knee flexion AROM to improve ability to complete sit<>stand transfer.  Baseline: see above 01/31/2022: 130 deg bilat Goal status: MET     LONG TERM GOALS: Target date:02/19/22   Patient will demonstrate 5/5 bilateral knee strength to improve ability to complete stair and curb negotiation.   Baseline: see above Goal status: met    2.  Patient will demonstrate at least 4+/5 bilateral hip strength to improve stability about the chain with prolonged walking and standing activity.  Baseline: see above  Goal status: partially met    3.  Patient will complete 5 x STS in </= 18 seconds to improve his ease of transfers.  Baseline: see above  Goal status: met    4.  Patient will report pain at worst rated as </= 6/10 to signify improvements in his current condition.  Baseline: 10/10 at worst  Status: 9/10 02/14/22 Goal status: ongoing      PLAN: PT FREQUENCY: 2x/week   PT DURATION: 6 weeks   PLANNED INTERVENTIONS: Therapeutic exercises, Therapeutic activity, Neuromuscular re-education, Balance training, Gait training, Patient/Family education, Self Care, Joint mobilization, Stair training, Dry Needling, Cryotherapy, Taping, Manual therapy, and Re-evaluation   PLAN FOR NEXT SESSION: review and progress HEP prn; hip and knee stretching/strengthening progressing to CKC as tolerated   Gwendolyn Grant, PT, DPT, ATC 02/14/22 9:59 AM

## 2022-02-14 ENCOUNTER — Ambulatory Visit: Payer: Medicare HMO

## 2022-02-14 DIAGNOSIS — G8929 Other chronic pain: Secondary | ICD-10-CM

## 2022-02-14 DIAGNOSIS — M6281 Muscle weakness (generalized): Secondary | ICD-10-CM

## 2022-02-14 DIAGNOSIS — M25562 Pain in left knee: Secondary | ICD-10-CM | POA: Diagnosis not present

## 2022-02-19 ENCOUNTER — Ambulatory Visit: Payer: Medicare HMO

## 2022-02-19 DIAGNOSIS — M25562 Pain in left knee: Secondary | ICD-10-CM | POA: Diagnosis not present

## 2022-02-19 DIAGNOSIS — G8929 Other chronic pain: Secondary | ICD-10-CM

## 2022-02-19 DIAGNOSIS — M6281 Muscle weakness (generalized): Secondary | ICD-10-CM

## 2022-02-19 NOTE — Therapy (Signed)
OUTPATIENT PHYSICAL THERAPY TREATMENT NOTE     Patient Name: Jared Shea. MRN: [id number] DOB:[birth year], 66 y.o., male Today's Date: 02/19/2022  PCP: [name], DO   REFERRING PROVIDER: Leandrew Koyanagi, MD   END OF SESSION:   PT End of Session - 02/19/22 0930     Visit Number 11    Number of Visits 13    Date for PT Re-Evaluation 02/24/22    Authorization Type [employer]    Authorization Time Period 01/17/2022 - 03/03/2022    Authorization - Visit Number 10    Authorization - Number of Visits 12    PT Start Time 0930    PT Stop Time 1013    PT Time Calculation (min) 43 min    Activity Tolerance Patient tolerated treatment well    Behavior During Therapy [facility] for tasks assessed/performed                      Past Medical History:  Diagnosis Date   Brain mass    Bursitis of right hip    Chest pain 08/12/2018   Chronic cough    Dependence on nicotine from cigarettes    Diabetes mellitus without complication ([place])    Essential hypertension 07/28/2018   nscl ca dx'd 12/2019   Seizures ([place])    Viral illness 03/23/2019   Past Surgical History:  Procedure Laterality Date   APPLICATION OF CRANIAL NAVIGATION N/A 01/07/2020   Procedure: APPLICATION OF CRANIAL NAVIGATION;  Surgeon: Ashok Pall, MD;  Location: [place];  Service: Neurosurgery;  Laterality: N/A;   CRANIOTOMY Right 01/07/2020   Procedure: RIGHT CRANIOTOMY FOR TUMOR RESECTION;  Surgeon: Ashok Pall, MD;  Location: [place];  Service: Neurosurgery;  Laterality: Right;  rm 21   ENDOBRONCHIAL ULTRASOUND N/A 12/21/2019   Procedure: ENDOBRONCHIAL ULTRASOUND;  Surgeon: Laurin Coder, MD;  [facility];  Service: Pulmonary;  Laterality: N/A;   FINE NEEDLE ASPIRATION  12/21/2019   Procedure: FINE NEEDLE ASPIRATION (FNA) LINEAR;  Surgeon: Laurin Coder, MD;  [facility];  Service: Pulmonary;;   IR IMAGING GUIDED PORT INSERTION  03/03/2020   NO PAST SURGERIES     VIDEO BRONCHOSCOPY N/A  12/21/2019   Procedure: VIDEO BRONCHOSCOPY WITHOUT FLUORO;  Surgeon: Laurin Coder, MD;  [facility];  Service: Pulmonary;  Laterality: N/A;   Patient Active Problem List   Diagnosis Date Noted   Bilateral primary osteoarthritis of knee 12/06/2021   Chills 09/08/2021   Primary osteoarthritis of right knee 08/15/2021   Primary osteoarthritis of left knee 08/15/2021   Erectile dysfunction 07/11/2021   Knee pain 07/11/2021   Dysuria 04/03/2021   Vision changes 03/14/2021   Memory loss 03/14/2021   Port-A-Cath in place 07/05/2020   Frequent headaches 06/27/2020   Polyarthralgia 06/15/2020   Headache 05/09/2020   Encounter for antineoplastic immunotherapy 04/18/2020   Goals of care, counseling/discussion 04/18/2020   Adenocarcinoma, metastatic ([place]) 01/07/2020   Adjustment disorder with depressed mood 01/06/2020   Thyroid nodule 01/04/2020   Non-small cell carcinoma of lung, stage 4 w/ mets to brain ([place]) 12/31/2019   Partial symptomatic epilepsy with complex partial seizures, not intractable, without status epilepticus ([place]) 12/23/2019   Malignant neoplasm metastatic to brain ([place]) 12/23/2019   Focal seizures ([place]) 12/09/2019   Healthcare maintenance 12/24/2018   Essential hypertension 07/28/2018    REFERRING DIAG: Chronic pain of both knees  THERAPY DIAG:  Chronic pain of left knee  Chronic pain of right knee  Muscle weakness (generalized)  Rationale for Evaluation and Treatment Rehabilitation  PERTINENT HISTORY: Stage IV non-small cell lung cancer, adenocarcinoma with solitary brain metastasis in addition to right hilar and mediastinal lymphadenopathy diagnosed in August 2021 currently being treated. Seizures   PRECAUTIONS: Active cancer   SUBJECTIVE: Patient reports his knee pain continues to fluctuate.   PAIN:  Are you having pain? Yes NPRS scale: 7/10 Pain location: Bilateral knees  Pain description: Sore  Aggravating factors: when transition from  sitting to standing Relieving factors: cortisone injection  PATIENT GOALS "get back on track, where my knees don't hurt."    OBJECTIVE: (objective measures completed at initial evaluation unless otherwise dated) PATIENT SURVEYS:  FOTO 51% function to 71% predicted   01/31/2022: 63% 02/14/22: 50% function    MUSCLE LENGTH: Hamstrings: Right lacking 40 deg; Left lacking 35 deg 01/22/22:Hamstring: Rt lacking 40 deg; Lt lacking 35 deg        01/31/22:Hamstring: Rt lacking 25 deg; Lt lacking 25 deg     POSTURE: rounded shoulders and forward head   PALPATION: TTP bilateral inferior pole of patella, fat pad, medial femoral condyle    LOWER EXTREMITY ROM:   Active ROM Right eval Left eval Rt / Lt 01/31/2022 02/14/22  Hip flexion        Hip extension        Hip abduction        Hip adduction        Hip internal rotation        Hip external rotation        Knee flexion 100 pn 109 pn 130 / 130   Knee extension WNL WNL    Ankle dorsiflexion Lacking 2 Lacking 5 0 / 0 Lt 3; Rt 3  Ankle plantarflexion        Ankle inversion        Ankle eversion         (Blank rows = not tested)   LOWER EXTREMITY MMT:   MMT Right eval Left eval Rt / Lt 01/29/2022 02/14/22  Hip flexion 4- 4- 4- / 4- 4+/5 bilateral   Hip extension 4- 4- 4- / 4- Lt: 4+/5; Rt: 4-/5   Hip abduction 4- 3+ 4- / 4- 4-/5 bilateral   Hip adduction        Hip internal rotation        Hip external rotation        Knee flexion 4+ 4+ 4+ / 4+ 5/5 bilateral  Knee extension 4 pn 4 pn 4 / 4 5/5 bilateral   Ankle dorsiflexion        Ankle plantarflexion        Ankle inversion        Ankle eversion         (Blank rows = not tested)   LOWER EXTREMITY SPECIAL TESTS:  Valgus stress (- bilateral)  Varus stress (- bilateral)  Anterior drawer (- bilateral)  Posterior drawer (- bilateral)  Clarke's sign (+ bilateral)  McMurray's (remains guarded)  Ely's (+ bilateral)    FUNCTIONAL TESTS:  5 times sit to stand: 24  seconds 02/07/22: 14.9 seconds    GAIT: Distance walked: 15 ft  Assistive device utilized: None Level of assistance: Complete Independence Comments: excessive pronation LLE, lateral trunk lean, WBOS       TODAY'S TREATMENT: [facility] Adult PT Treatment:  DATE: 02/19/22 Therapeutic Exercise: NuStep level 7 x 5 minutes UE/LE  Prone quad stretch x 60 sec each  Lateral step downs 4 inch 2 x 10  Forward step downs 4 inch 2 x 10  SL leg press 2 x 10; 30 lbs  Step ups 2 x 10; 8 inch step  SL bridge 2 x 8    [facility] Adult PT Treatment:                                                DATE: 02/14/22 Therapeutic Exercise: Lateral step downs 2 inch step 2 x 10 NuStep level 7 x 5 minutes UE/LE  Forward step downs 2 inch step 2 x 10  Prone hip extension 2 x 15  Cybex hip flexion 2 x 10 @ 17.5 lbs   Therapeutic Activity: Re-assessment to determine overall progress, educating patient on progress towards goals/functional improvement     [facility] Adult PT Treatment:                                                DATE: 02/12/22 Therapeutic Exercise: NuStep level 7 x 5 minutes UE/LE  SL leg press 2 x 10 @ 40 lbs on RLE; 30 lbs on LLE Lateral band walk 3 sets d/b at counter; green band at shins  Bodyweight squats with BUE support 2 x 10  Sidelying hip abduction 2 x 15  Updated HEP        PATIENT EDUCATION:  Education details: n/a Person educated: n/a Education method: n/a Education comprehension: n/a   HOME EXERCISE PROGRAM: Access Code: [email or web address]    ASSESSMENT: CLINICAL IMPRESSION: Patient arrives with moderate pain about bilateral knees. He is demonstrating improved eccentric quad strength as noted through ability to complete controlled step down from higher step compared to previous sessions. He continues to respond well to ther ex reporting abolishment of his knee pain at conclusion of session.      OBJECTIVE IMPAIRMENTS Abnormal gait,  decreased activity tolerance, decreased endurance, decreased knowledge of condition, difficulty walking, decreased ROM, decreased strength, impaired flexibility, improper body mechanics, postural dysfunction, and pain.    ACTIVITY LIMITATIONS carrying, lifting, bending, sitting, standing, squatting, stairs, and locomotion level   PARTICIPATION LIMITATIONS: cleaning, shopping, community activity, and yard work   PERSONAL FACTORS Age, Fitness, Time since onset of injury/illness/exacerbation, and 3+ comorbidities: active cancer undergoing treatment, diabetes, seizures  are also affecting patient's functional outcome.      GOALS: Goals reviewed with patient? Yes   SHORT TERM GOALS: Target date: 01/29/22  Patient will be independent and compliant with initial HEP.  Baseline: issued at eval  01/31/2022: independent Goal status: MET   2.  Patient will improve bilateral hamstring length by at least 10 degrees and bilateral ankle DF by at least 5 degrees to reduce stress about the knees with closed chain activity.  Baseline: see above  01/31/2022: see above 02/14/22: see above  Goal status: MET   3.  Patient will demonstrate at least 115 degrees of bilateral knee flexion AROM to improve ability to complete sit<>stand transfer.  Baseline: see above 01/31/2022: 130 deg bilat Goal status: MET     LONG TERM GOALS: Target date:02/19/22   Patient will demonstrate  5/5 bilateral knee strength to improve ability to complete stair and curb negotiation.  Baseline: see above Goal status: met    2.  Patient will demonstrate at least 4+/5 bilateral hip strength to improve stability about the chain with prolonged walking and standing activity.  Baseline: see above  Goal status: partially met    3.  Patient will complete 5 x STS in </= 18 seconds to improve his ease of transfers.  Baseline: see above  Goal status: met    4.  Patient will report pain at worst rated as </= 6/10 to signify improvements in  his current condition.  Baseline: 10/10 at worst  Status: 9/10 02/14/22 Goal status: ongoing      PLAN: PT FREQUENCY: 2x/week   PT DURATION: 6 weeks   PLANNED INTERVENTIONS: Therapeutic exercises, Therapeutic activity, Neuromuscular re-education, Balance training, Gait training, Patient/Family education, Self Care, Joint mobilization, Stair training, Dry Needling, Cryotherapy, Taping, Manual therapy, and Re-evaluation   PLAN FOR NEXT SESSION: review and progress HEP prn; hip and knee stretching/strengthening progressing to CKC as tolerated; anticipate D/C   Gwendolyn Grant, PT, DPT, ATC 02/19/22 10:13 AM

## 2022-02-21 ENCOUNTER — Ambulatory Visit: Payer: Medicare HMO

## 2022-02-21 DIAGNOSIS — M25562 Pain in left knee: Secondary | ICD-10-CM | POA: Diagnosis not present

## 2022-02-21 DIAGNOSIS — M6281 Muscle weakness (generalized): Secondary | ICD-10-CM

## 2022-02-21 DIAGNOSIS — G8929 Other chronic pain: Secondary | ICD-10-CM

## 2022-02-21 NOTE — Therapy (Signed)
OUTPATIENT PHYSICAL THERAPY TREATMENT NOTE  PHYSICAL THERAPY DISCHARGE SUMMARY  Visits from Start of Care: 12  Current functional level related to goals / functional outcomes: See goals below   Remaining deficits: Intermittent bilateral knee pain Hip weakness   Education / Equipment: See education    Patient agrees to discharge. Patient goals were partially met. Patient is being discharged due to maximized rehab potential.     Patient Name: Jared Shea. MRN: [id number] DOB:1955/06/04, 66 y.o., male Today's Date: 02/21/2022  PCP: [name], DO   REFERRING PROVIDER: Leandrew Koyanagi, MD   END OF SESSION:   PT End of Session - 02/21/22 0930     Visit Number 12    Number of Visits 13    Date for PT Re-Evaluation 02/24/22    Authorization Type [employer]    Authorization Time Period 01/17/2022 - 03/03/2022    Authorization - Visit Number 11    Authorization - Number of Visits 12    PT Start Time 8003    PT Stop Time 1013    PT Time Calculation (min) 42 min    Activity Tolerance Patient tolerated treatment well    Behavior During Therapy [facility] for tasks assessed/performed                      Past Medical History:  Diagnosis Date   Brain mass    Bursitis of right hip    Chest pain 08/12/2018   Chronic cough    Dependence on nicotine from cigarettes    Diabetes mellitus without complication ([place])    Essential hypertension 07/28/2018   nscl ca dx'd 12/2019   Seizures ([place])    Viral illness 03/23/2019   Past Surgical History:  Procedure Laterality Date   APPLICATION OF CRANIAL NAVIGATION N/A 01/07/2020   Procedure: APPLICATION OF CRANIAL NAVIGATION;  Surgeon: Ashok Pall, MD;  Location: [place];  Service: Neurosurgery;  Laterality: N/A;   CRANIOTOMY Right 01/07/2020   Procedure: RIGHT CRANIOTOMY FOR TUMOR RESECTION;  Surgeon: Ashok Pall, MD;  Location: [place];  Service: Neurosurgery;  Laterality: Right;  rm 21   ENDOBRONCHIAL ULTRASOUND N/A 12/21/2019    Procedure: ENDOBRONCHIAL ULTRASOUND;  Surgeon: Laurin Coder, MD;  [facility];  Service: Pulmonary;  Laterality: N/A;   FINE NEEDLE ASPIRATION  12/21/2019   Procedure: FINE NEEDLE ASPIRATION (FNA) LINEAR;  Surgeon: Laurin Coder, MD;  [facility];  Service: Pulmonary;;   IR IMAGING GUIDED PORT INSERTION  03/03/2020   NO PAST SURGERIES     VIDEO BRONCHOSCOPY N/A 12/21/2019   Procedure: VIDEO BRONCHOSCOPY WITHOUT FLUORO;  Surgeon: Laurin Coder, MD;  [facility];  Service: Pulmonary;  Laterality: N/A;   Patient Active Problem List   Diagnosis Date Noted   Bilateral primary osteoarthritis of knee 12/06/2021   Chills 09/08/2021   Primary osteoarthritis of right knee 08/15/2021   Primary osteoarthritis of left knee 08/15/2021   Erectile dysfunction 07/11/2021   Knee pain 07/11/2021   Dysuria 04/03/2021   Vision changes 03/14/2021   Memory loss 03/14/2021   Port-A-Cath in place 07/05/2020   Frequent headaches 06/27/2020   Polyarthralgia 06/15/2020   Headache 05/09/2020   Encounter for antineoplastic immunotherapy 04/18/2020   Goals of care, counseling/discussion 04/18/2020   Adenocarcinoma, metastatic ([place]) 01/07/2020   Adjustment disorder with depressed mood 01/06/2020   Thyroid nodule 01/04/2020   Non-small cell carcinoma of lung, stage 4 w/ mets to brain ([place]) 12/31/2019   Partial symptomatic  epilepsy with complex partial seizures, not intractable, without status epilepticus ([place]) 12/23/2019   Malignant neoplasm metastatic to brain ([place]) 12/23/2019   Focal seizures ([place]) 12/09/2019   Healthcare maintenance 12/24/2018   Essential hypertension 07/28/2018    REFERRING DIAG: Chronic pain of both knees  THERAPY DIAG:  Chronic pain of left knee  Chronic pain of right knee  Muscle weakness (generalized)  Rationale for Evaluation and Treatment Rehabilitation  PERTINENT HISTORY: Stage IV non-small cell lung cancer, adenocarcinoma with  solitary brain metastasis in addition to right hilar and mediastinal lymphadenopathy diagnosed in August 2021 currently being treated. Seizures   PRECAUTIONS: Active cancer   SUBJECTIVE: Patient reports his knee pain continues to fluctuate. He feels that the exercises help with his pain when he does them. He feels that he has progressed well in PT and can continue his exercises on his own at this time.   PAIN:  Are you having pain? Yes NPRS scale: 6/10 Pain location: Bilateral knees  Pain description: Sore  Aggravating factors: when transition from sitting to standing Relieving factors: cortisone injection  PATIENT GOALS "get back on track, where my knees don't hurt."    OBJECTIVE: (objective measures completed at initial evaluation unless otherwise dated) PATIENT SURVEYS:  FOTO 51% function to 71% predicted   01/31/2022: 63% 02/14/22: 50% function  02/21/22: 60% function    MUSCLE LENGTH: Hamstrings: Right lacking 40 deg; Left lacking 35 deg 01/22/22:Hamstring: Rt lacking 40 deg; Lt lacking 35 deg        01/31/22:Hamstring: Rt lacking 25 deg; Lt lacking 25 deg     POSTURE: rounded shoulders and forward head   PALPATION: TTP bilateral inferior pole of patella, fat pad, medial femoral condyle    LOWER EXTREMITY ROM:   Active ROM Right eval Left eval Rt / Lt 01/31/2022 02/14/22  Hip flexion        Hip extension        Hip abduction        Hip adduction        Hip internal rotation        Hip external rotation        Knee flexion 100 pn 109 pn 130 / 130   Knee extension WNL WNL    Ankle dorsiflexion Lacking 2 Lacking 5 0 / 0 Lt 3; Rt 3  Ankle plantarflexion        Ankle inversion        Ankle eversion         (Blank rows = not tested)   LOWER EXTREMITY MMT:   MMT Right eval Left eval Rt / Lt 01/29/2022 02/14/22 02/21/22  Hip flexion 4- 4- 4- / 4- 4+/5 bilateral  5/5 bilateral   Hip extension 4- 4- 4- / 4- Lt: 4+/5; Rt: 4-/5  4/5 bilateral  Hip abduction 4- 3+ 4- /  4- 4-/5 bilateral  4/5 bilateral   Hip adduction         Hip internal rotation         Hip external rotation         Knee flexion 4+ 4+ 4+ / 4+ 5/5 bilateral   Knee extension 4 pn 4 pn 4 / 4 5/5 bilateral    Ankle dorsiflexion         Ankle plantarflexion         Ankle inversion         Ankle eversion          (Blank rows = not  tested)   LOWER EXTREMITY SPECIAL TESTS:  Valgus stress (- bilateral)  Varus stress (- bilateral)  Anterior drawer (- bilateral)  Posterior drawer (- bilateral)  Clarke's sign (+ bilateral)  McMurray's (remains guarded)  Ely's (+ bilateral)    FUNCTIONAL TESTS:  5 times sit to stand: 24 seconds 02/07/22: 14.9 seconds    GAIT: Distance walked: 15 ft  Assistive device utilized: None Level of assistance: Complete [place] Comments: excessive pronation LLE, lateral trunk lean, WBOS       TODAY'S TREATMENT: [facility] Adult PT Treatment:                                                DATE: 02/21/22 Therapeutic Exercise: NuStep level 5 x 5 minutes  Standing hip abduction green band 2 x 10 each Standing hip extension green band 2 x 10 each  Reviewed and updated HEP demonstrating/returning demo and discussing frequency, sets, reps of all prescribed exercises  Therapeutic Activity: Re-assessment to determine overall progress, educating patient on progress towards goals.      [facility] Adult PT Treatment:                                                DATE: 02/19/22 Therapeutic Exercise: NuStep level 7 x 5 minutes UE/LE  Prone quad stretch x 60 sec each  Lateral step downs 4 inch 2 x 10  Forward step downs 4 inch 2 x 10  SL leg press 2 x 10; 30 lbs  Step ups 2 x 10; 8 inch step  SL bridge 2 x 8    [facility] Adult PT Treatment:                                                DATE: 02/14/22 Therapeutic Exercise: Lateral step downs 2 inch step 2 x 10 NuStep level 7 x 5 minutes UE/LE  Forward step downs 2 inch step 2 x 10  Prone hip extension 2 x 15  Cybex  hip flexion 2 x 10 @ 17.5 lbs   Therapeutic Activity: Re-assessment to determine overall progress, educating patient on progress towards goals/functional improvement         PATIENT EDUCATION:                                              DATE: 02/21/22  Education details: see treatment; d/c education  Person educated: Patient Education method: Consulting civil engineer, Media planner, Verbal cues, and Handouts Education comprehension: verbalized understanding and returned demonstration    HOME EXERCISE PROGRAM: Access Code: [email or web address]    ASSESSMENT: CLINICAL IMPRESSION: Hodges has responded well to physical therapy demonstrating improvements in BLE ROM and strength. Despite ongoing intermittent high pain levels about bilateral knees he responds well to therapeutic exercises, having total abolishment of his pain after completion of prescribed stretching and strengthening activity during sessions. Educated patient on importance of continuing with prescribed HEP to assist in overall pain reduction with patient verbalizing understanding. It was recommended  that if his pain does not continue to respond positively to his home exercise program to f/u with referring provider for further assessment with patient verbalizing understanding He is therefore appropriate for D/C at this time with patient in agreement with this plan.      OBJECTIVE IMPAIRMENTS Abnormal gait, decreased activity tolerance, decreased endurance, decreased knowledge of condition, difficulty walking, decreased ROM, decreased strength, impaired flexibility, improper body mechanics, postural dysfunction, and pain.    ACTIVITY LIMITATIONS carrying, lifting, bending, sitting, standing, squatting, stairs, and locomotion level   PARTICIPATION LIMITATIONS: cleaning, shopping, community activity, and yard work   PERSONAL FACTORS Age, Fitness, Time since onset of injury/illness/exacerbation, and 3+ comorbidities: active cancer undergoing  treatment, diabetes, seizures  are also affecting patient's functional outcome.      GOALS: Goals reviewed with patient? Yes   SHORT TERM GOALS: Target date: 01/29/22  Patient will be independent and compliant with initial HEP.  Baseline: issued at eval  01/31/2022: independent Goal status: MET   2.  Patient will improve bilateral hamstring length by at least 10 degrees and bilateral ankle DF by at least 5 degrees to reduce stress about the knees with closed chain activity.  Baseline: see above  01/31/2022: see above 02/14/22: see above  Goal status: MET   3.  Patient will demonstrate at least 115 degrees of bilateral knee flexion AROM to improve ability to complete sit<>stand transfer.  Baseline: see above 01/31/2022: 130 deg bilat Goal status: MET     LONG TERM GOALS: Target date:02/19/22   Patient will demonstrate 5/5 bilateral knee strength to improve ability to complete stair and curb negotiation.  Baseline: see above Goal status: met    2.  Patient will demonstrate at least 4+/5 bilateral hip strength to improve stability about the chain with prolonged walking and standing activity.  Baseline: see above  Goal status: partially met    3.  Patient will complete 5 x STS in </= 18 seconds to improve his ease of transfers.  Baseline: see above  Goal status: met    4.  Patient will report pain at worst rated as </= 6/10 to signify improvements in his current condition.  Baseline: 10/10 at worst  Status: 9/10 02/14/22 Status: 10/10 at worst 02/21/22 Goal status: not met       PLAN: PT FREQUENCY: n/a   PT DURATION: n/a   PLANNED INTERVENTIONS: Therapeutic exercises, Therapeutic activity, Neuromuscular re-education, Balance training, Gait training, Patient/Family education, Self Care, Joint mobilization, Stair training, Dry Needling, Cryotherapy, Taping, Manual therapy, and Re-evaluation   PLAN FOR NEXT SESSION: n/a  Gwendolyn Grant, PT, DPT, ATC 02/21/22 10:27  AM

## 2022-02-23 ENCOUNTER — Other Ambulatory Visit: Payer: Self-pay | Admitting: Physician Assistant

## 2022-02-23 ENCOUNTER — Telehealth: Payer: Self-pay | Admitting: Orthopaedic Surgery

## 2022-02-23 MED ORDER — CELECOXIB 200 MG PO CAPS: 200.0000 mg | ORAL_CAPSULE | Freq: Two times a day (BID) | ORAL | 3 refills | Status: DC

## 2022-02-23 NOTE — Progress Notes (Addendum)
[place] OFFICE PROGRESS NOTE  Jared Blonder, DO [address] [place]  [address]  DIAGNOSIS:  Stage IV (TX, N2, M1 C) non-small cell lung cancer, adenocarcinoma diagnosed in August 2021 and presented with solitary brain metastasis in addition to mediastinal lymphadenopathy.   PDL1 Expression 70%   Molecular Biomarkers:  Tumor Mutational Burden - 52 Muts/Mb Microsatellite status - MS-Stable Genomic Findings For a complete list of the genes assayed, please refer to the Appendix. NF1 E1694* MTAP loss exons 2-8 RICTOR amplification ATRX A419V BRAF K483E CDKN2A/B CDKN2A loss, CDKN2B loss DNMT3A E205* FGF10 amplification NTRK1 amplification - equivocal? 7 Disease relevant genes with no reportable alterations: ALK, EGFR, ERBB2, KRAS, MET, RET, ROS1     PRIOR THERAPY: 1) Status post right craniotomy with tumor resection followed by [facility] to solitary brain metastasis under the care of Jared Shea and Jared Shea. 2) Concurrent chemoradiation with weekly carboplatin for AUC of 2 and paclitaxel 45 mg/M2.  First dose 02/01/2020. Status post 5 cycles.  Last dose was given February 29, 2020.  CURRENT THERAPY: First-line treatment with immunotherapy with Libtayo (Cempilimab) 350 mg IV every 3 weeks.  First dose April 25, 2020.  Status post 32 cycles.  INTERVAL HISTORY: Jared Shea. 66 y.o. male returns to the clinic today for a follow-up visit. He denies new concerns today. Previously, the patient has been having concerns related to arthralgias since starting immunotherapy.  He was finally agreeable to seeing an orthopedic provider and he was found to have degenerative joint disease in his knees.  He received a joint injection with improvement in his symptoms. He receives injections about 1x every 3 months. However, the patient recently started undergoing PT which significantly helped his symptoms and now he is no longer requiring medication for his joint pain. He previously  was taking norco, ibuprofen, and Voltaren cream.    Regarding his treatment, he denies any fever, chills, or night sweats. He has some occasional shortness of breath if he flexes his neck forward. He states he coughs "every now and then" but nothing significant. He denies chest discomfort or hemoptysis. Denies any nausea, vomiting, diarrhea, or constipation.  Denies any rashes or skin changes. He is followed closely by Jared Shea for his history of metastatic disease to the brain.  He is here today for evaluation before starting cycle #33     MEDICAL HISTORY: Past Medical History:  Diagnosis Date   Brain mass    Bursitis of right hip    Chest pain 08/12/2018   Chronic cough    Dependence on nicotine from cigarettes    Diabetes mellitus without complication ([place])    Essential hypertension 07/28/2018   nscl ca dx'd 12/2019   Seizures ([place])    Viral illness 03/23/2019    ALLERGIES:  is allergic to penicillins.  MEDICATIONS:  Current Outpatient Medications  Medication Sig Dispense Refill   albuterol (VENTOLIN HFA) 108 (90 Base) MCG/ACT inhaler Inhale 2 puffs into the lungs every 6 (six) hours as needed for wheezing or shortness of breath. 8 g 2   benzonatate (TESSALON) 100 MG capsule Take 1 capsule (100 mg total) by mouth 3 (three) times daily as needed for cough. 30 capsule 2   celecoxib (CELEBREX) 200 MG capsule Take 1 capsule (200 mg total) by mouth 2 (two) times daily. 30 capsule 3   HYDROcodone-acetaminophen (NORCO) 5-325 MG tablet Take 1 tablet by mouth every 6 (six) hours as needed for moderate pain. 30 tablet 0  levETIRAcetam (KEPPRA) 750 MG tablet Take 1 tablet (750 mg total) by mouth 2 (two) times daily. 180 tablet 4   losartan (COZAAR) 25 MG tablet Take 1 tablet (25 mg total) by mouth daily. 90 tablet 3   nortriptyline (PAMELOR) 25 MG capsule TAKE 2 CAPSULES(50 MG) BY MOUTH AT BEDTIME 60 capsule 5   omeprazole (PRILOSEC) 20 MG capsule Take 1 capsule (20 mg total) by mouth daily.  Pt takes in the morning 90 capsule 1   potassium chloride SA (KLOR-CON M) 20 MEQ tablet Take 1 tablet (20 mEq total) by mouth daily. 7 tablet 0   sildenafil (VIAGRA) 50 MG tablet Take 2 tablets (100 mg total) by mouth daily as needed for erectile dysfunction. 30 tablet 3   No current facility-administered medications for this visit.    SURGICAL HISTORY:  Past Surgical History:  Procedure Laterality Date   APPLICATION OF CRANIAL NAVIGATION N/A 01/07/2020   Procedure: APPLICATION OF CRANIAL NAVIGATION;  Surgeon: Ashok Pall, MD;  Location: [place];  Service: Neurosurgery;  Laterality: N/A;   CRANIOTOMY Right 01/07/2020   Procedure: RIGHT CRANIOTOMY FOR TUMOR RESECTION;  Surgeon: Ashok Pall, MD;  Location: [place];  Service: Neurosurgery;  Laterality: Right;  rm 21   ENDOBRONCHIAL ULTRASOUND N/A 12/21/2019   Procedure: ENDOBRONCHIAL ULTRASOUND;  Surgeon: Jared Coder, MD;  [facility];  Service: Pulmonary;  Laterality: N/A;   FINE NEEDLE ASPIRATION  12/21/2019   Procedure: FINE NEEDLE ASPIRATION (FNA) LINEAR;  Surgeon: Jared Coder, MD;  [facility];  Service: Pulmonary;;   IR IMAGING GUIDED PORT INSERTION  03/03/2020   NO PAST SURGERIES     VIDEO BRONCHOSCOPY N/A 12/21/2019   Procedure: VIDEO BRONCHOSCOPY WITHOUT FLUORO;  Surgeon: Jared Coder, MD;  [facility];  Service: Pulmonary;  Laterality: N/A;    REVIEW OF SYSTEMS:   Review of Systems  Constitutional: Negative for appetite change, chills, fatigue, fever and unexpected weight change.  HENT: Negative for mouth sores, nosebleeds, sore throat and trouble swallowing.   Eyes: Negative for eye problems and icterus.  Respiratory: Positive for intermittent shortness of breath. Negative for cough, hemoptysis, and wheezing.   Cardiovascular: Negative for chest pain and leg swelling.  Gastrointestinal: Negative for abdominal pain, constipation, diarrhea, nausea and vomiting.  Genitourinary:  Negative for bladder incontinence, difficulty urinating, dysuria, frequency and hematuria.   Musculoskeletal: Negative for back pain, gait problem, neck pain and neck stiffness.  Skin: Negative for itching and rash.  Neurological: Negative for dizziness, extremity weakness, gait problem, headaches, light-headedness and seizures.  Hematological: Negative for adenopathy. Does not bruise/bleed easily.  Psychiatric/Behavioral: Negative for confusion, depression and sleep disturbance. The patient is not nervous/anxious.     PHYSICAL EXAMINATION:  There were no vitals taken for this visit.  ECOG PERFORMANCE STATUS: 1  Physical Exam  Constitutional: Oriented to person, place, and time and well-developed, well-nourished, and in no distress.  HENT:  Head: Normocephalic and atraumatic.  Mouth/Throat: Oropharynx is clear and moist. No oropharyngeal exudate.  Eyes: Conjunctivae are normal. Right eye exhibits no discharge. Left eye exhibits no discharge. No scleral icterus.  Neck: Normal range of motion. Neck supple.  Cardiovascular: Normal rate, regular rhythm, normal heart sounds and intact distal pulses.   Pulmonary/Chest: Effort normal and breath sounds normal. No respiratory distress. No wheezes. No rales.  Abdominal: Soft. Bowel sounds are normal. Exhibits no distension and no mass. There is no tenderness.  Musculoskeletal: Normal range of motion. Exhibits no edema.  Lymphadenopathy:  No cervical adenopathy.  Neurological: Alert and oriented to person, place, and time. Exhibits normal muscle tone. Gait normal. Coordination normal.  Skin: Skin is warm and dry. No rash noted. Not diaphoretic. No erythema. No pallor.  Psychiatric: Mood, memory and judgment normal.  Vitals reviewed.  LABORATORY DATA: Lab Results  Component Value Date   WBC 6.8 02/27/2022   HGB 13.9 02/27/2022   HCT 38.3 (L) 02/27/2022   MCV 83.1 02/27/2022   PLT 376 02/27/2022      Chemistry      Component Value  Date/Time   NA 133 (L) 02/27/2022 1434   NA 140 11/23/2019 1516   K 3.9 02/27/2022 1434   CL 101 02/27/2022 1434   CO2 25 02/27/2022 1434   BUN 17 02/27/2022 1434   BUN 14 11/23/2019 1516   CREATININE 0.73 02/27/2022 1434      Component Value Date/Time   CALCIUM 9.5 02/27/2022 1434   ALKPHOS 83 02/27/2022 1434   AST 12 (L) 02/27/2022 1434   ALT 12 02/27/2022 1434   BILITOT 0.8 02/27/2022 1434       RADIOGRAPHIC STUDIES:  No results found.   ASSESSMENT/PLAN:  This is a very pleasant 66 year old African-American male diagnosed with stage IV (Tx, N2, M1c) non-small cell lung cancer, adenocarcinoma.  He presented with a solitary brain metastasis in addition to right hilar and mediastinal lymphadenopathy.  He was diagnosed in August 2021.     The patient is status post right craniotomy with resection of the solitary brain metastasis with SRS under the care of Jared Shea and Jared Shea.    He completed weekly concurrent chemoradiation with carboplatin for an AUC of 2 and paclitaxel 45 mg per metered square.  He is status post 5 cycles.  He tolerated it well except for some dysphagia/odynophagia.   The patient then had evidence of new brain metastases.  He is status post SRS to the new lesions on 04/22/2020 under the care of Jared Shea.   The patient is currently undergoing immunotherapy with Libtayo IV every 3 weeks.  He is status post 32 cycles and tolerated it well except for arthralgias.  Previously offered a break from treatment due to his arthralgias.  The patient followed up with orthopedics who found that the patient has degenerative joint disease in his knees.  Since undergoing PT, he reports significant improvement in his pain. He has not needed to take any medications for arthralgias.    Labs were reviewed.  Patient's blood sugar was 457 today.  This is new.  The patient does not have a history of diabetes.  An adverse side effect of immunotherapy can include type 1 diabetes,  due to destruction of the pancreatic beta cells. I reviewed this with Jared Shea.  We will arrange for the patient to receive 10 units of insulin while in the infusion room today and recheck his blood sugar in 1 hour.  We reviewed signs and symptoms of hyperglycemia with the patient today.  The patient thinks he has been very thirsty for the last week and a half.  Otherwise he denies any unusual weakness diarrhea, nausea vomiting, or diarrhea today.  We will give him an AVS of hyper glycemia symptoms and instructed to go to the emergency room if he has any new or worsening symptoms such as weakness, altered mental status, nausea, vomiting, diarrhea, etc.  The patient was instructed to let his son be aware of this and to monitor for any mental status changes.  We will reach out to the patient's PCP to see if they can make an appointment ASAP for further testing and management of possible diabetes.  I reviewed with Jared Shea today, Jared Shea recommends that he continue with treatment today as scheduled.  He will receive cycle #33.   In the meantime, recommend that he proceed with cycle #33 today as scheduled.    We will see the patient back for follow-up visit in 3 weeks for evaluation and repeat blood work before starting cycle #24.  The patient was advised to call immediately if he has any concerning symptoms in the interval. The patient voices understanding of current disease status and treatment options and is in agreement with the current care plan. All questions were answered. The patient knows to call the clinic with any problems, questions or concerns. We can certainly see the patient much sooner if necessary    No orders of the defined types were placed in this encounter.     The total time spent in the appointment was 30-39 minutes.   L , PA-C 02/27/22

## 2022-02-23 NOTE — Telephone Encounter (Signed)
Pt called requesting a refill of celecoxib. [facility] on [address] . Pt phone number is [phone].

## 2022-02-23 NOTE — Telephone Encounter (Signed)
Notified patient.

## 2022-02-26 ENCOUNTER — Telehealth: Payer: Self-pay | Admitting: Internal Medicine

## 2022-02-26 ENCOUNTER — Other Ambulatory Visit: Payer: Self-pay | Admitting: Physician Assistant

## 2022-02-26 MED ORDER — CELECOXIB 200 MG PO CAPS: 200.0000 mg | ORAL_CAPSULE | Freq: Two times a day (BID) | ORAL | 3 refills | Status: DC

## 2022-02-26 NOTE — Telephone Encounter (Signed)
Called patient regarding upcoming October and November appointments, patient is notified.  

## 2022-02-27 ENCOUNTER — Other Ambulatory Visit: Payer: Self-pay

## 2022-02-27 ENCOUNTER — Inpatient Hospital Stay: Payer: Medicare HMO

## 2022-02-27 ENCOUNTER — Telehealth: Payer: Self-pay

## 2022-02-27 ENCOUNTER — Inpatient Hospital Stay: Payer: Medicare HMO | Attending: Internal Medicine

## 2022-02-27 ENCOUNTER — Other Ambulatory Visit: Payer: Self-pay | Admitting: Physician Assistant

## 2022-02-27 ENCOUNTER — Inpatient Hospital Stay (HOSPITAL_BASED_OUTPATIENT_CLINIC_OR_DEPARTMENT_OTHER): Payer: Medicare HMO | Admitting: Physician Assistant

## 2022-02-27 ENCOUNTER — Telehealth: Payer: Self-pay | Admitting: *Deleted

## 2022-02-27 VITALS — BP 100/71 | HR 104 | Temp 97.8°F | Resp 18

## 2022-02-27 VITALS — BP 119/81 | HR 95

## 2022-02-27 DIAGNOSIS — E1165 Type 2 diabetes mellitus with hyperglycemia: Secondary | ICD-10-CM | POA: Diagnosis not present

## 2022-02-27 DIAGNOSIS — C799 Secondary malignant neoplasm of unspecified site: Secondary | ICD-10-CM

## 2022-02-27 DIAGNOSIS — Z791 Long term (current) use of non-steroidal anti-inflammatories (NSAID): Secondary | ICD-10-CM | POA: Diagnosis not present

## 2022-02-27 DIAGNOSIS — I10 Essential (primary) hypertension: Secondary | ICD-10-CM | POA: Insufficient documentation

## 2022-02-27 DIAGNOSIS — Z5112 Encounter for antineoplastic immunotherapy: Secondary | ICD-10-CM

## 2022-02-27 DIAGNOSIS — C3491 Malignant neoplasm of unspecified part of right bronchus or lung: Secondary | ICD-10-CM | POA: Diagnosis not present

## 2022-02-27 DIAGNOSIS — Z79899 Other long term (current) drug therapy: Secondary | ICD-10-CM | POA: Insufficient documentation

## 2022-02-27 DIAGNOSIS — Z923 Personal history of irradiation: Secondary | ICD-10-CM | POA: Insufficient documentation

## 2022-02-27 DIAGNOSIS — Z87891 Personal history of nicotine dependence: Secondary | ICD-10-CM | POA: Insufficient documentation

## 2022-02-27 DIAGNOSIS — M170 Bilateral primary osteoarthritis of knee: Secondary | ICD-10-CM | POA: Insufficient documentation

## 2022-02-27 DIAGNOSIS — R739 Hyperglycemia, unspecified: Secondary | ICD-10-CM | POA: Diagnosis not present

## 2022-02-27 DIAGNOSIS — C7931 Secondary malignant neoplasm of brain: Secondary | ICD-10-CM | POA: Insufficient documentation

## 2022-02-27 DIAGNOSIS — C3490 Malignant neoplasm of unspecified part of unspecified bronchus or lung: Secondary | ICD-10-CM | POA: Insufficient documentation

## 2022-02-27 DIAGNOSIS — Z95828 Presence of other vascular implants and grafts: Secondary | ICD-10-CM

## 2022-02-27 LAB — CMP (CANCER CENTER ONLY)
ALT: 12 U/L (ref 0–44)
AST: 12 U/L — ABNORMAL LOW (ref 15–41)
Albumin: 4.5 g/dL (ref 3.5–5.0)
Alkaline Phosphatase: 83 U/L (ref 38–126)
Anion gap: 7 (ref 5–15)
BUN: 17 mg/dL (ref 8–23)
CO2: 25 mmol/L (ref 22–32)
Calcium: 9.5 mg/dL (ref 8.9–10.3)
Chloride: 101 mmol/L (ref 98–111)
Creatinine: 0.73 mg/dL (ref 0.61–1.24)
GFR, Estimated: >60 mL/min (ref >60)
Glucose, Bld: 457 mg/dL — ABNORMAL HIGH (ref 70–99)
Potassium: 3.9 mmol/L (ref 3.5–5.1)
Sodium: 133 mmol/L — ABNORMAL LOW (ref 135–145)
Total Bilirubin: 0.8 mg/dL (ref 0.3–1.2)
Total Protein: 8 g/dL (ref 6.5–8.1)

## 2022-02-27 LAB — CBC WITH DIFFERENTIAL (CANCER CENTER ONLY)
Abs Immature Granulocytes: 0.03 10*3/uL (ref 0.00–0.07)
Basophils Absolute: 0 10*3/uL (ref 0.0–0.1)
Basophils Relative: 1 %
Eosinophils Absolute: 0 10*3/uL (ref 0.0–0.5)
Eosinophils Relative: 1 %
HCT: 38.3 % — ABNORMAL LOW (ref 39.0–52.0)
Hemoglobin: 13.9 g/dL (ref 13.0–17.0)
Immature Granulocytes: 0 %
Lymphocytes Relative: 25 %
Lymphs Abs: 1.7 10*3/uL (ref 0.7–4.0)
MCH: 30.2 pg (ref 26.0–34.0)
MCHC: 36.3 g/dL — ABNORMAL HIGH (ref 30.0–36.0)
MCV: 83.1 fL (ref 80.0–100.0)
Monocytes Absolute: 0.3 10*3/uL (ref 0.1–1.0)
Monocytes Relative: 5 %
Neutro Abs: 4.7 10*3/uL (ref 1.7–7.7)
Neutrophils Relative %: 68 %
Platelet Count: 376 10*3/uL (ref 150–400)
RBC: 4.61 MIL/uL (ref 4.22–5.81)
RDW: 13.3 % (ref 11.5–15.5)
WBC Count: 6.8 10*3/uL (ref 4.0–10.5)
nRBC: 0 % (ref 0.0–0.2)

## 2022-02-27 MED ORDER — SODIUM CHLORIDE 0.9 % IV SOLN
350.0000 mg | Freq: Once | INTRAVENOUS | Status: AC
  Administered 2022-02-27: 350 mg via INTRAVENOUS
  Filled 2022-02-27: qty 7

## 2022-02-27 MED ORDER — HEPARIN SOD (PORK) LOCK FLUSH 100 UNIT/ML IV SOLN
500.0000 [IU] | Freq: Once | INTRAVENOUS | Status: AC | PRN
  Administered 2022-02-27: 500 [IU]

## 2022-02-27 MED ORDER — SODIUM CHLORIDE 0.9% FLUSH
10.0000 mL | Freq: Once | INTRAVENOUS | Status: AC
  Administered 2022-02-27: 10 mL

## 2022-02-27 MED ORDER — SODIUM CHLORIDE 0.9 % IV SOLN: Freq: Once | INTRAVENOUS | Status: AC

## 2022-02-27 MED ORDER — SODIUM CHLORIDE 0.9% FLUSH
10.0000 mL | INTRAVENOUS | Status: DC | PRN
  Administered 2022-02-27: 10 mL

## 2022-02-27 MED ORDER — INSULIN ASPART 100 UNIT/ML IJ SOLN
10.0000 [IU] | Freq: Once | INTRAMUSCULAR | Status: AC
  Administered 2022-02-27: 10 [IU] via SUBCUTANEOUS
  Filled 2022-02-27: qty 1

## 2022-02-27 NOTE — Progress Notes (Signed)
CBG 438

## 2022-02-27 NOTE — Progress Notes (Signed)
Ok to treat w/ Libtayo per Cassie PA with pulse 104

## 2022-02-27 NOTE — Telephone Encounter (Signed)
Call from [place].  Patient is thetre receiving an Infusion.  Glucose today was 457.  Gave patient Novolog today.  10 units.  Patient is scheduled for an appointment in the Clinics on Thursday to address the Hyperglycemia since starting the Infusions.

## 2022-02-27 NOTE — Telephone Encounter (Signed)
This nurse reached out to patients primary care physician and scheduled an appointment for patient due to patient having elevated glucose level of 457 today.  Spoke with the nurse at Dr. Markus Shea office and made them aware that this elevation may be coming from his treatment.  Advised that he has received 10 units of Novolog today.  The patient has been made aware and knows the importance of keeping this appointment.  This nurse also spoke with patients sister Jared Shea and made her aware of situation and appointment on Thursday at 945 am.  No further questions or concerns at this time.

## 2022-02-27 NOTE — Patient Instructions (Addendum)
[facility]   Discharge Instructions: Thank you for choosing [facility] to provide your oncology and hematology care.   If you have a lab appointment with the [facility], please go directly to the [facility] and check in at the registration area.   Wear comfortable clothing and clothing appropriate for easy access to any Portacath or PICC line.   We strive to give you quality time with your provider. You may need to reschedule your appointment if you arrive late (15 or more minutes).  Arriving late affects you and other patients whose appointments are after yours.  Also, if you miss three or more appointments without notifying the office, you may be dismissed from the clinic at the provider's discretion.      For prescription refill requests, have your pharmacy contact our office and allow 72 hours for refills to be completed.    Today you received the following chemotherapy and/or immunotherapy agents: Cemiplimab (Libtayo)       To help prevent nausea and vomiting after your treatment, we encourage you to take your nausea medication as directed.  BELOW ARE SYMPTOMS THAT SHOULD BE REPORTED IMMEDIATELY: *FEVER GREATER THAN 100.4 F (38 C) OR HIGHER *CHILLS OR SWEATING *NAUSEA AND VOMITING THAT IS NOT CONTROLLED WITH YOUR NAUSEA MEDICATION *UNUSUAL SHORTNESS OF BREATH *UNUSUAL BRUISING OR BLEEDING *URINARY PROBLEMS (pain or burning when urinating, or frequent urination) *BOWEL PROBLEMS (unusual diarrhea, constipation, pain near the anus) TENDERNESS IN MOUTH AND THROAT WITH OR WITHOUT PRESENCE OF ULCERS (sore throat, sores in mouth, or a toothache) UNUSUAL RASH, SWELLING OR PAIN  UNUSUAL VAGINAL DISCHARGE OR ITCHING   Items with * indicate a potential emergency and should be followed up as soon as possible or go to the Emergency Department if any problems should occur.  Please show the CHEMOTHERAPY ALERT CARD or IMMUNOTHERAPY ALERT CARD  at check-in to the Emergency Department and triage nurse.  Should you have questions after your visit or need to cancel or reschedule your appointment, please contact [facility]  Dept: [phone]  and follow the prompts.  Office hours are 8:00 a.m. to 4:30 p.m. Monday - Friday. Please note that voicemails left after 4:00 p.m. may not be returned until the following business day.  We are closed weekends and major holidays. You have access to a nurse at all times for urgent questions. Please call the main number to the clinic Dept: [phone] and follow the prompts.   For any non-urgent questions, you may also contact your provider using MyChart. We now offer e-Visits for anyone 55 and older to request care online for non-urgent symptoms. For details visit [email or web address].   Also download the MyChart app! Go to the app store, search "MyChart", open the app, select [facility], and log in with your MyChart username and password.  Masks are optional in the cancer centers. If you would like for your care team to wear a mask while they are taking care of you, please let them know. You may have one support person who is at least 68 years old accompany you for your appointments.  Hyperglycemia Hyperglycemia is when the sugar (glucose) level in your blood is too high. High blood sugar can happen to people who have or do not have diabetes. High blood sugar can happen quickly. It can be an emergency. What are the causes? If you have diabetes, high blood sugar may be caused by: Medicines that increase blood  sugar or affect your control of diabetes. Getting less physical activity. Overeating. Being sick or injured or having an infection. Having surgery. Stress. Not giving yourself enough insulin (if you are taking it). You may have high blood sugar because you have diabetes that has not been diagnosed yet. If you do not have diabetes, high blood sugar may be  caused by: Certain medicines. Stress. A bad illness. An infection. Having surgery. Diseases of the pancreas. What increases the risk? This condition is more likely to develop in people who have risk factors for diabetes, such as: Having a family member with diabetes. Certain conditions in which the body's defense system (immune system) attacks itself. These are called autoimmune disorders. Being overweight. Not being active. Having a condition called insulin resistance. Having a history of: Prediabetes. Diabetes when pregnant. Polycystic ovarian syndrome (PCOS). What are the signs or symptoms? This condition may not cause symptoms. If you do have symptoms, they may include: Feeling more thirsty than normal. Needing to pee (urinate) more often than normal. Hunger. Feeling very tired. Blurry eyesight (vision). You may get other symptoms as the condition gets worse, such as: Dry mouth. Pain in your belly (abdomen). Not being hungry (loss of appetite). Breath that smells fruity. Weakness. Weight loss that is not planned. A tingling or numb feeling in your hands or feet. A headache. Cuts or bruises that heal slowly. How is this treated? Treatment depends on the cause of your condition. Treatment may include: Taking medicine to control your blood sugar levels. Changing your medicine or dosage if you take insulin or other diabetes medicines. Lifestyle changes. These may include: Exercising more. Eating healthier foods. Losing weight. Treating an illness or infection. Checking your blood sugar more often. Stopping or reducing steroid medicines. If your condition gets very bad, you will need to be treated in the hospital. Follow these instructions at home: General instructions Take over-the-counter and prescription medicines only as told by your doctor. Do not smoke or use any products that contain nicotine or tobacco. If you need help quitting, ask your doctor. If you drink  alcohol: Limit how much you have to: 0-1 drink a day for women who are not pregnant. 0-2 drinks a day for men. Know how much alcohol is in a drink. In the [place] one drink equals one 12 oz bottle of beer (355 mL), one 5 oz glass of wine (148 mL), or one 1 oz glass of hard liquor (44 mL). Manage stress. If you need help with this, ask your doctor. Do exercises as told by your doctor. Keep all follow-up visits. Eating and drinking  Stay at a healthy weight. Make sure you drink enough fluid when you: Exercise. Get sick. Are in hot temperatures. Drink enough fluid to keep your pee (urine) pale yellow. If you have diabetes:  Know the symptoms of high blood sugar. Follow your diabetes management plan as told by your doctor. Make sure you: Take insulin and medicines as told. Follow your exercise plan. Follow your meal plan. Eat on time. Do not skip meals. Check your blood sugar as often as told. Make sure you check before and after exercise. If you exercise longer or in a different way, check your blood sugar more often. Follow your sick day plan whenever you cannot eat or drink normally. Make this plan ahead of time with your doctor. Share your diabetes management plan with people in your workplace, school, and household. Check your pee for ketones when you are ill and  as told by your doctor. Carry a card or wear jewelry that says that you have diabetes. Where to find more information American Diabetes Association: www.diabetes.org Contact a doctor if: Your blood sugar level is at or above 240 mg/dL (13.3 mmol/L) for 2 days in a row. You have problems keeping your blood sugar in your target range. You have high blood pressure often. You have signs of illness, such as: Feeling like you may vomit (feeling nauseous). Vomiting. A fever. Get help right away if: Your blood sugar monitor reads "high" even when you are taking insulin. You have trouble breathing. You have a change in how  you think, feel, or act (mental status). You feel like you may vomit, and the feeling does not go away. You cannot stop vomiting. These symptoms may be an emergency. Get medical help right away. Call your local emergency services (911 in the [place] Do not wait to see if the symptoms will go away. Do not drive yourself to the hospital. Summary Hyperglycemia is when the sugar (glucose) level in your blood is too high. High blood sugar can happen to people who have or do not have diabetes. Make sure you drink enough fluids and follow your meal plan. Exercise as often as told by your doctor. Contact your doctor if you have problems keeping your blood sugar in your target range. This information is not intended to replace advice given to you by your health care provider. Make sure you discuss any questions you have with your health care provider. Document Revised: 02/12/2020 Document Reviewed: 02/12/2020 Elsevier Patient Education  [place].

## 2022-02-27 NOTE — Telephone Encounter (Signed)
[facility] calling about genetic for celbrex 200mg  to be called or sent for refill.  Pharmacy fax number [phone], phone number is [phone]. Arpita is the technician

## 2022-02-28 ENCOUNTER — Inpatient Hospital Stay: Payer: Medicare HMO

## 2022-02-28 ENCOUNTER — Other Ambulatory Visit: Payer: Self-pay | Admitting: Physician Assistant

## 2022-02-28 ENCOUNTER — Inpatient Hospital Stay (HOSPITAL_BASED_OUTPATIENT_CLINIC_OR_DEPARTMENT_OTHER): Payer: Medicare HMO | Admitting: Physician Assistant

## 2022-02-28 ENCOUNTER — Other Ambulatory Visit: Payer: Self-pay

## 2022-02-28 VITALS — BP 128/76 | HR 90 | Temp 97.6°F | Resp 16

## 2022-02-28 VITALS — BP 135/77 | HR 91 | Resp 16

## 2022-02-28 DIAGNOSIS — C3491 Malignant neoplasm of unspecified part of right bronchus or lung: Secondary | ICD-10-CM

## 2022-02-28 DIAGNOSIS — Z5112 Encounter for antineoplastic immunotherapy: Secondary | ICD-10-CM | POA: Diagnosis not present

## 2022-02-28 DIAGNOSIS — R739 Hyperglycemia, unspecified: Secondary | ICD-10-CM

## 2022-02-28 DIAGNOSIS — Z95828 Presence of other vascular implants and grafts: Secondary | ICD-10-CM

## 2022-02-28 LAB — CMP (CANCER CENTER ONLY)
ALT: 11 U/L (ref 0–44)
AST: 12 U/L — ABNORMAL LOW (ref 15–41)
Albumin: 4.1 g/dL (ref 3.5–5.0)
Alkaline Phosphatase: 75 U/L (ref 38–126)
Anion gap: 8 (ref 5–15)
BUN: 21 mg/dL (ref 8–23)
CO2: 24 mmol/L (ref 22–32)
Calcium: 8.9 mg/dL (ref 8.9–10.3)
Chloride: 101 mmol/L (ref 98–111)
Creatinine: 0.8 mg/dL (ref 0.61–1.24)
GFR, Estimated: >60 mL/min
Glucose, Bld: 496 mg/dL — ABNORMAL HIGH (ref 70–99)
Potassium: 4.1 mmol/L (ref 3.5–5.1)
Sodium: 133 mmol/L — ABNORMAL LOW (ref 135–145)
Total Bilirubin: 0.9 mg/dL (ref 0.3–1.2)
Total Protein: 7.3 g/dL (ref 6.5–8.1)

## 2022-02-28 LAB — CBC WITH DIFFERENTIAL (CANCER CENTER ONLY)
Abs Immature Granulocytes: 0.03 10*3/uL (ref 0.00–0.07)
Basophils Absolute: 0 10*3/uL (ref 0.0–0.1)
Basophils Relative: 1 %
Eosinophils Absolute: 0.1 10*3/uL (ref 0.0–0.5)
Eosinophils Relative: 1 %
HCT: 35.5 % — ABNORMAL LOW (ref 39.0–52.0)
Hemoglobin: 13 g/dL (ref 13.0–17.0)
Immature Granulocytes: 1 %
Lymphocytes Relative: 20 %
Lymphs Abs: 1.2 10*3/uL (ref 0.7–4.0)
MCH: 30.5 pg (ref 26.0–34.0)
MCHC: 36.6 g/dL — ABNORMAL HIGH (ref 30.0–36.0)
MCV: 83.3 fL (ref 80.0–100.0)
Monocytes Absolute: 0.4 10*3/uL (ref 0.1–1.0)
Monocytes Relative: 6 %
Neutro Abs: 4.4 10*3/uL (ref 1.7–7.7)
Neutrophils Relative %: 71 %
Platelet Count: 338 10*3/uL (ref 150–400)
RBC: 4.26 MIL/uL (ref 4.22–5.81)
RDW: 13.4 % (ref 11.5–15.5)
WBC Count: 6.1 10*3/uL (ref 4.0–10.5)
nRBC: 0 % (ref 0.0–0.2)

## 2022-02-28 LAB — URINALYSIS, COMPLETE (UACMP) WITH MICROSCOPIC
Bilirubin Urine: NEGATIVE
Glucose, UA: >=500 mg/dL — AB
Hgb urine dipstick: NEGATIVE
Ketones, ur: 5 mg/dL — AB
Leukocytes,Ua: NEGATIVE
Nitrite: NEGATIVE
Protein, ur: NEGATIVE mg/dL
Specific Gravity, Urine: 1.029 (ref 1.005–1.030)
pH: 5 (ref 5.0–8.0)

## 2022-02-28 LAB — TSH: TSH: 0.842 u[IU]/mL (ref 0.350–4.500)

## 2022-02-28 LAB — T4: T4, Total: 13 ug/dL — ABNORMAL HIGH (ref 4.5–12.0)

## 2022-02-28 MED ORDER — SODIUM CHLORIDE 0.9% FLUSH
10.0000 mL | Freq: Once | INTRAVENOUS | Status: AC
  Administered 2022-02-28: 10 mL

## 2022-02-28 MED ORDER — INSULIN ASPART 100 UNIT/ML IJ SOLN
15.0000 [IU] | Freq: Once | INTRAMUSCULAR | Status: AC
  Administered 2022-02-28: 15 [IU] via SUBCUTANEOUS
  Filled 2022-02-28: qty 1

## 2022-02-28 MED ORDER — HEPARIN SOD (PORK) LOCK FLUSH 100 UNIT/ML IV SOLN
500.0000 [IU] | Freq: Once | INTRAVENOUS | Status: AC
  Administered 2022-02-28: 500 [IU]

## 2022-02-28 MED ORDER — CELECOXIB 200 MG PO CAPS: 200.0000 mg | ORAL_CAPSULE | Freq: Two times a day (BID) | ORAL | 3 refills | Status: DC

## 2022-02-28 MED ORDER — SODIUM CHLORIDE 0.9 % IV SOLN: Freq: Once | INTRAVENOUS | Status: AC

## 2022-02-28 NOTE — Progress Notes (Signed)
Symptom Management Consult note [facility]    Patient Care Team: [name], DO as PCP - General    Name of the patient: Jared Shea  [id number]  1955-12-25   Date of visit: 02/28/2022   Chief Complaint/Reason for visit: recheck blood sugar   Current Therapy: Cempilimab   Last treatment:  Day 1   Cycle 33 on 02/27/22   ASSESSMENT & PLAN: Patient is a 66 y.o. male  with oncologic history of NSCLC stage IV with mets to brain followed by Dr. Julien Nordmann.  I have viewed most recent oncology note and lab work.    #)  NSCLC stage IV with mets to brain -Had treatment yesterday - Next appointment with oncologist is 03/30/22   #) Hyperglycemia -Found to have new significant hyperglycemia at oncologist visit yesterday. His pcp office was contacted and is scheduled to see him tomorrow 03/01/22. -Brought patient to clinic today to recheck labs and r/o DKA.  -CMP today shows glucose elevated at 496. Sodium is 133, corrected to 143. No other significant electrolyte derangement. Normal bicarb and anion gap. Normal kidney function. -UA has 5 ketones. As patient has normal bicarb and anion gap he is not currently in DKA. -Given 1 L IVF and 15 units Novolog. Rechecked glucose afterwards and it is 378. -As he is not in DKA he is stable to discharge home. Discussed with patient healthy eating choices for tonight. He is scheduled to see pcp tomorrow morning at 945. I discussed new or worsening symptoms that would warrant emergent ED evaluation. -Medical oncology team made aware of labs and agree with plan.      Heme/Onc History: Oncology History  Non-small cell carcinoma of lung, stage 4 w/ mets to brain ([place])  12/31/2019 Initial Diagnosis   Non-small cell carcinoma of lung, stage 4 w/ mets to brain ([facility])   06/06/2020 Cancer Staging   Staging form: Lung, AJCC 8th Edition - Clinical: Stage IVB (cT0, cN2, cM1c) - Signed by Curt Bears, MD on 06/06/2020    Adenocarcinoma, metastatic ([facility])  01/07/2020 Initial Diagnosis   Adenocarcinoma, metastatic ([place])   02/01/2020 - 03/07/2020 Chemotherapy   The patient had dexamethasone (DECADRON) 4 MG tablet, 8 mg, Oral, Daily, 1 of 1 cycle, Start date: --, End date: -- palonosetron (ALOXI) injection 0.25 mg, 0.25 mg, Intravenous,  Once, 5 of 7 cycles Administration: 0.25 mg (02/01/2020), 0.25 mg (02/08/2020), 0.25 mg (03/07/2020), 0.25 mg (02/15/2020), 0.25 mg (02/22/2020) CARBOplatin (PARAPLATIN) 220 mg in sodium chloride 0.9 % 250 mL chemo infusion, 220 mg (100 % of original dose 216.8 mg), Intravenous,  Once, 5 of 7 cycles Dose modification: 216.8 mg (original dose 216.8 mg, Cycle 1) Administration: 220 mg (02/01/2020), 220 mg (02/08/2020), 220 mg (03/07/2020), 220 mg (02/15/2020), 220 mg (02/22/2020) PACLitaxel (TAXOL) 90 mg in sodium chloride 0.9 % 250 mL chemo infusion (</= 80mg /m2), 45 mg/m2 = 90 mg, Intravenous,  Once, 5 of 7 cycles Administration: 90 mg (02/01/2020), 90 mg (02/08/2020), 90 mg (03/07/2020), 90 mg (02/15/2020), 90 mg (02/22/2020)  for chemotherapy treatment.    04/25/2020 -  Chemotherapy   Patient is on Treatment Plan : LUNG NSCLC Cemiplimab q21d         Interval history-: Jared Shea. is a 66 y.o. male with oncologic history as above presenting to [facility] today with chief complaint of needign his blood sugar rechecked.Patient seen by medical oncology team  yesterday prior to treatment and was found to have new hyperglycemia though to  be related to immunotherapy.  Patient tells me over the last week he has had increased thirst and urinary frequency. He has intermittent dizziness when he is moving quickly. He had not had any falls or head injuries. He noticed his breath smelled fruity last week as well. He denies any recent illness. He denies family history of diabetes. He admits his diet consists of mostly fast food because of the convenience. He denies difficulty breathing, dry skin, headache,  myalgias, nausea, vomiting.     ROS  All other systems are reviewed and are negative for acute change except as noted in the HPI.    Allergies  Allergen Reactions   Penicillins Rash    Did it involve swelling of the face/tongue/throat, SOB, or low BP? N Did it involve sudden or severe rash/hives, skin peeling, or any reaction on the inside of your mouth or nose? Y Did you need to seek medical attention at a hospital or doctor's office? Y When did it last happen?    childhood   If all above answers are "NO", may proceed with cephalosporin use.      Past Medical History:  Diagnosis Date   Brain mass    Bursitis of right hip    Chest pain 08/12/2018   Chronic cough    Dependence on nicotine from cigarettes    Diabetes mellitus without complication ([place])    Essential hypertension 07/28/2018   nscl ca dx'd 12/2019   Seizures ([place])    Viral illness 03/23/2019     Past Surgical History:  Procedure Laterality Date   APPLICATION OF CRANIAL NAVIGATION N/A 01/07/2020   Procedure: APPLICATION OF CRANIAL NAVIGATION;  Surgeon: Ashok Pall, MD;  Location: [facility];  Service: Neurosurgery;  Laterality: N/A;   CRANIOTOMY Right 01/07/2020   Procedure: RIGHT CRANIOTOMY FOR TUMOR RESECTION;  Surgeon: Ashok Pall, MD;  Location: [facility];  Service: Neurosurgery;  Laterality: Right;  rm 21   ENDOBRONCHIAL ULTRASOUND N/A 12/21/2019   Procedure: ENDOBRONCHIAL ULTRASOUND;  Surgeon: Laurin Coder, MD;  Location: [facility];  Service: Pulmonary;  Laterality: N/A;   FINE NEEDLE ASPIRATION  12/21/2019   Procedure: FINE NEEDLE ASPIRATION (FNA) LINEAR;  Surgeon: Laurin Coder, MD;  Location: [facility];  Service: Pulmonary;;   IR IMAGING GUIDED PORT INSERTION  03/03/2020   NO PAST SURGERIES     VIDEO BRONCHOSCOPY N/A 12/21/2019   Procedure: VIDEO BRONCHOSCOPY WITHOUT FLUORO;  Surgeon: Laurin Coder, MD;  Location: [facility];  Service: Pulmonary;  Laterality: N/A;    Social History    Socioeconomic History   Marital status: Divorced    Spouse name: Not on file   Number of children: Not on file   Years of education: Not on file   Highest education level: Not on file  Occupational History   Not on file  Tobacco Use   Smoking status: Former    Packs/day: 1.00    Years: 48.00    Total pack years: 48.00    Types: Cigarettes    Start date: 08/07/1970    Quit date: 12/15/2019    Years since quitting: 2.2   Smokeless tobacco: Never  Vaping Use   Vaping Use: Never used  Substance and Sexual Activity   Alcohol use: Not Currently   Drug use: Never   Sexual activity: Not on file    Comment: YES  Other Topics Concern   Not on file  Social History Narrative   Not on file   Social Determinants of  Health   Financial Resource Strain: High Risk (12/24/2019)   Overall Financial Resource Strain (CARDIA)    Difficulty of Paying Living Expenses: Hard  Food Insecurity: No Food Insecurity (12/24/2019)   Hunger Vital Sign    Worried About Running Out of Food in the Last Year: Never true    Ran Out of Food in the Last Year: Never true  Transportation Needs: Unmet Transportation Needs (12/24/2019)   PRAPARE - Hydrologist (Medical): Yes    Lack of Transportation (Non-Medical): Yes  Physical Activity: Not on file  Stress: Not on file  Social Connections: Not on file  Intimate Partner Violence: Not on file    Family History  Problem Relation Age of Onset   Heart disease Mother        CABG   Kidney disease Sister    Diabetes Brother    Kidney disease Brother        KIDNEY TRANSPLANT   Hypertension Sister    Healthy Daughter    Healthy Daughter    Healthy Son      Current Outpatient Medications:    albuterol (VENTOLIN HFA) 108 (90 Base) MCG/ACT inhaler, Inhale 2 puffs into the lungs every 6 (six) hours as needed for wheezing or shortness of breath., Disp: 8 g, Rfl: 2   benzonatate (TESSALON) 100 MG capsule, Take 1 capsule (100 mg total)  by mouth 3 (three) times daily as needed for cough., Disp: 30 capsule, Rfl: 2   celecoxib (CELEBREX) 200 MG capsule, Take 1 capsule (200 mg total) by mouth 2 (two) times daily., Disp: 30 capsule, Rfl: 3   HYDROcodone-acetaminophen (NORCO) 5-325 MG tablet, Take 1 tablet by mouth every 6 (six) hours as needed for moderate pain., Disp: 30 tablet, Rfl: 0   levETIRAcetam (KEPPRA) 750 MG tablet, Take 1 tablet (750 mg total) by mouth 2 (two) times daily., Disp: 180 tablet, Rfl: 4   losartan (COZAAR) 25 MG tablet, Take 1 tablet (25 mg total) by mouth daily., Disp: 90 tablet, Rfl: 3   nortriptyline (PAMELOR) 25 MG capsule, TAKE 2 CAPSULES(50 MG) BY MOUTH AT BEDTIME, Disp: 60 capsule, Rfl: 5   omeprazole (PRILOSEC) 20 MG capsule, Take 1 capsule (20 mg total) by mouth daily. Pt takes in the morning, Disp: 90 capsule, Rfl: 1   potassium chloride SA (KLOR-CON M) 20 MEQ tablet, Take 1 tablet (20 mEq total) by mouth daily., Disp: 7 tablet, Rfl: 0   sildenafil (VIAGRA) 50 MG tablet, Take 2 tablets (100 mg total) by mouth daily as needed for erectile dysfunction., Disp: 30 tablet, Rfl: 3  PHYSICAL EXAM: ECOG FS:1 - Symptomatic but completely ambulatory    Vitals:   02/28/22 1049  BP: 128/76  Pulse: 90  Resp: 16  Temp: 97.6 F (36.4 C)  TempSrc: Oral  SpO2: 97%   Physical Exam Vitals and nursing note reviewed.  Constitutional:      Appearance: He is well-developed. He is not ill-appearing or toxic-appearing.  HENT:     Head: Normocephalic.     Nose: Nose normal.     Mouth/Throat:     Mouth: Mucous membranes are moist.  Eyes:     Conjunctiva/sclera: Conjunctivae normal.  Neck:     Vascular: No JVD.  Cardiovascular:     Rate and Rhythm: Normal rate and regular rhythm.     Pulses: Normal pulses.     Heart sounds: Normal heart sounds.  Pulmonary:     Effort: Pulmonary effort is normal.  No respiratory distress.     Breath sounds: Normal breath sounds. No stridor. No wheezing, rhonchi or rales.   Chest:     Chest wall: No tenderness.  Abdominal:     General: There is no distension.     Palpations: Abdomen is soft. There is no mass.     Tenderness: There is no abdominal tenderness. There is no guarding or rebound.     Hernia: No hernia is present.  Musculoskeletal:     Cervical back: Normal range of motion.     Right lower leg: No edema.     Left lower leg: No edema.  Skin:    General: Skin is warm and dry.  Neurological:     Mental Status: He is oriented to person, place, and time.       LABORATORY DATA: I have reviewed the data as listed    Latest Ref Rng & Units 02/28/2022   10:45 AM 02/27/2022    2:34 PM 02/05/2022    1:11 PM  CBC  WBC 4.0 - 10.5 K/uL 6.1  6.8  5.9   Hemoglobin 13.0 - 17.0 g/dL 13.0  13.9  12.4   Hematocrit 39.0 - 52.0 % 35.5  38.3  34.7   Platelets 150 - 400 K/uL 338  376  257         Latest Ref Rng & Units 02/28/2022   10:45 AM 02/27/2022    2:34 PM 02/05/2022    1:11 PM  CMP  Glucose 70 - 99 mg/dL 496  457  208   BUN 8 - 23 mg/dL 21  17  14    Creatinine 0.61 - 1.24 mg/dL 0.80  0.73  0.65   Sodium 135 - 145 mmol/L 133  133  137   Potassium 3.5 - 5.1 mmol/L 4.1  3.9  3.5   Chloride 98 - 111 mmol/L 101  101  105   CO2 22 - 32 mmol/L 24  25  28    Calcium 8.9 - 10.3 mg/dL 8.9  9.5  9.1   Total Protein 6.5 - 8.1 g/dL 7.3  8.0  7.7   Total Bilirubin 0.3 - 1.2 mg/dL 0.9  0.8  0.9   Alkaline Phos 38 - 126 U/L 75  83  75   AST 15 - 41 U/L 12  12  17    ALT 0 - 44 U/L 11  12  17         RADIOGRAPHIC STUDIES (from last 24 hours if applicable) I have personally reviewed the radiological images as listed and agreed with the findings in the report. No results found.      Visit Diagnosis: 1. Non-small cell carcinoma of right lung, stage 4 ([place])   2. Hyperglycemia      No orders of the defined types were placed in this encounter.   All questions were answered. The patient knows to call the clinic with any problems, questions or  concerns. No barriers to learning was detected.  I have spent a total of 30 minutes minutes of face-to-face and non-face-to-face time, preparing to see the patient, obtaining and/or reviewing separately obtained history, performing a medically appropriate examination, counseling and educating the patient, ordering tests, documenting clinical information in the electronic health record, and care coordination (communications with other health care professionals or caregivers).    Thank you for allowing me to participate in the care of this patient.    Barrie Folk, PA-C Department of Hematology/Oncology [facility]  at [facility] Phone: [phone]  [phone]    02/28/2022 12:38 PM

## 2022-02-28 NOTE — Telephone Encounter (Signed)
Sent in

## 2022-02-28 NOTE — Addendum Note (Signed)
Addended by: Thyra Breed E on: 02/28/2022 08:28 AM   Modules accepted: Orders

## 2022-02-28 NOTE — Progress Notes (Signed)
Lab orders placed for [facility] visit.

## 2022-02-28 NOTE — Progress Notes (Signed)
CBG 378 1 hour after 15 units insulin.

## 2022-02-28 NOTE — Telephone Encounter (Signed)
Thanks, Regino Schultze!

## 2022-03-01 ENCOUNTER — Ambulatory Visit (INDEPENDENT_AMBULATORY_CARE_PROVIDER_SITE_OTHER): Payer: Medicare HMO | Admitting: Internal Medicine

## 2022-03-01 ENCOUNTER — Other Ambulatory Visit: Payer: Self-pay | Admitting: Student

## 2022-03-01 VITALS — BP 123/81 | HR 92 | Temp 98.0°F | Wt 182.5 lb

## 2022-03-01 DIAGNOSIS — Z7984 Long term (current) use of oral hypoglycemic drugs: Secondary | ICD-10-CM

## 2022-03-01 DIAGNOSIS — E119 Type 2 diabetes mellitus without complications: Secondary | ICD-10-CM

## 2022-03-01 DIAGNOSIS — Z794 Long term (current) use of insulin: Secondary | ICD-10-CM | POA: Diagnosis not present

## 2022-03-01 DIAGNOSIS — E1369 Other specified diabetes mellitus with other specified complication: Secondary | ICD-10-CM | POA: Diagnosis not present

## 2022-03-01 LAB — POCT GLYCOSYLATED HEMOGLOBIN (HGB A1C): Hemoglobin A1C: 9.2 % — AB (ref 4.0–5.6)

## 2022-03-01 LAB — GLUCOSE, CAPILLARY: Glucose-Capillary: 221 mg/dL — ABNORMAL HIGH (ref 70–99)

## 2022-03-01 MED ORDER — INSULIN STARTER KIT- SYRINGES (ENGLISH): 1.0000 | Freq: Once | 0 refills | Status: AC

## 2022-03-01 MED ORDER — METFORMIN HCL ER 500 MG PO TB24: 500.0000 mg | ORAL_TABLET | Freq: Every day | ORAL | 2 refills | Status: DC

## 2022-03-01 MED ORDER — INSULIN STARTER KIT- SYRINGES (ENGLISH): 1.0000 | Freq: Once | 0 refills | Status: DC

## 2022-03-01 MED ORDER — INSULIN DETEMIR 100 UNIT/ML ~~LOC~~ SOLN: 10.0000 [IU] | Freq: Every day | SUBCUTANEOUS | 11 refills | Status: DC

## 2022-03-01 MED ORDER — BLOOD GLUCOSE MONITOR KIT: PACK | 0 refills | Status: DC

## 2022-03-01 MED ORDER — INSULIN SYRINGES (DISPOSABLE) U-100 0.3 ML MISC: 0.1000 mL | Freq: Every day | 9 refills | Status: DC

## 2022-03-01 NOTE — Assessment & Plan Note (Signed)
>>  ASSESSMENT AND PLAN FOR UNCONTROLLED TYPE 2 DIABETES MELLITUS WITH HYPERGLYCEMIA, WITHOUT LONG-TERM CURRENT USE OF INSULIN  (HCC) WRITTEN ON 03/01/2022  1:20 PM BY DEAN, EMILY, DO  Patient presents today after multiple lab tests through his oncology office this week showed blood glucose levels of 457 and 496. He also had a UA done which showed glucose >500 and positive ketones. Last HbA1c obtained 03/2021 was normal at 4.7%. He has had concerns of steroid induced diabetes hyperglycemia in the past which self resolved.  He has had increased thirst but denies increased urinary frequency. He denies dizziness or changes in vision at this time. He has not had great appetite but has been staying hydrated. These symptoms began Monday of this week.   He did receive 1 L IVF and 15 units of NovoLog  yesterday at his oncologist's office when his blood glucose was noted to be 496. This decreased his glucose level to 378. Metabolic panel and clinical presentation were not significant for DKA. He was allowed to return home and advised to make healthy meal choices.   This morning his random blood sugar was 221 and a HbA1c resulted as 9.2%. He has not had food yet today and had chicken and sauteed vegetables for dinner last night. He says that he does have more energy today compared to earlier in the week, in addition to feeling somewhat more like himself. He previously had a strong fruity odor to his breath but this has improved significantly. He denies nausea, vomiting, or diarrhea. He has some tingling in his finger tips. Assessment:A rare side effect of Jared Shea current infusion therapy is induction of type 1 diabetes, however he has been receiving this medication every 3 weeks since December 2021. He does endorse family history in his mother and sister of diabetes but which type is unclear. Given his age I would not expect type 1 diabetes onset spontaneously. Given his HbA1c and the above information I suspect that  he more likely has type 2 diabetes mellitus. I do not have concern at this time that he is in DKA.  Plan:Start Levemir  10 units daily and metformin  500 mg daily. Plan to titrate up both of these medications at follow-up in 2 weeks. I have asked that he check his blood sugar three times daily before meals and keep a log of these readings. He is open to the possibility of a CGM and discussion further at his follow up visit. Given his weight I have deferred starting a third agent such as a GLP-1 receptor agonist at this time but consider starting this in the future if indicated to titrate him off of insulin . Consider checking autoantibodies in the future if continued suspicion of type 1 versus type 2 diabetes.

## 2022-03-01 NOTE — Assessment & Plan Note (Signed)
Patient presents today after multiple lab tests through his oncology office this week showed blood glucose levels of 457 and 496. He also had a UA done which showed glucose >500 and positive ketones. Last HbA1c obtained 03/2021 was normal at 4.7%. He has had concerns of steroid induced diabetes hyperglycemia in the past which self resolved.  He has had increased thirst but denies increased urinary frequency. He denies dizziness or changes in vision at this time. He has not had great appetite but has been staying hydrated. These symptoms began Monday of this week.   He did receive 1 L IVF and 15 units of NovoLog yesterday at his oncologist's office when his blood glucose was noted to be 496. This decreased his glucose level to 378. Metabolic panel and clinical presentation were not significant for DKA. He was allowed to return home and advised to make healthy meal choices.   This morning his random blood sugar was 221 and a HbA1c resulted as 9.2%. He has not had food yet today and had chicken and sauteed vegetables for dinner last night. He says that he does have more energy today compared to earlier in the week, in addition to feeling somewhat more like himself. He previously had a strong fruity odor to his breath but this has improved significantly. He denies nausea, vomiting, or diarrhea. He has some tingling in his finger tips. Assessment:A rare side effect of Jared Shea current infusion therapy is induction of type 1 diabetes, however he has been receiving this medication every 3 weeks since December 2021. He does endorse family history in his mother and sister of diabetes but which type is unclear. Given his age I would not expect type 1 diabetes onset spontaneously. Given his HbA1c and the above information I suspect that he more likely has type 2 diabetes mellitus. I do not have concern at this time that he is in DKA.  Plan:Start Levemir 10 units daily and metformin 500 mg daily. Plan to titrate up  both of these medications at follow-up in 2 weeks. I have asked that he check his blood sugar three times daily before meals and keep a log of these readings. He is open to the possibility of a CGM and discussion further at his follow up visit. Given his weight I have deferred starting a third agent such as a GLP-1 receptor agonist at this time but consider starting this in the future if indicated to titrate him off of insulin. Consider checking autoantibodies in the future if continued suspicion of type 1 versus type 2 diabetes.

## 2022-03-01 NOTE — Progress Notes (Signed)
CC: symptomatic hyperglycemia  HPI:  Jared Shea. is a 66 y.o. person with past medical history as detailed below who presents for new onset symptomatic hyperglycemia. Please see problem based charting for detailed assessment and plan.  Past Medical History:  Diagnosis Date   Brain mass    Bursitis of right hip    Chest pain 08/12/2018   Chronic cough    Dependence on nicotine from cigarettes    Diabetes mellitus without complication ([place])    Essential hypertension 07/28/2018   nscl ca dx'd 12/2019   Seizures ([place])    Viral illness 03/23/2019   Review of Systems:  Negative unless otherwise stated.  Physical Exam:  Vitals:   03/01/22 0924  BP: 123/81  Pulse: 92  Temp: 98 F (36.7 C)  TempSrc: Oral  SpO2: 100%  Weight: 182 lb 8 oz (82.8 kg)   Constitutional:Appears stated age, well. In no acute distress. Cardio:Regular rate and rhythm. No murmurs, rubs, or gallops. Pulm:Clear to auscultation bilaterally. Normal work of breathing on room air. DDU:KGURKYHC for extremity edema. Skin:Warm and dry. Neuro:Alert and oriented x3. No focal deficit noted. Psych:Pleasant mood and affect.  Assessment & Plan:   See Encounters Tab for problem based charting.  Diabetes mellitus [facility]) Patient presents today after multiple lab tests through his oncology office this week showed blood glucose levels of 457 and 496. He also had a UA done which showed glucose >500 and positive ketones. Last HbA1c obtained 03/2021 was normal at 4.7%. He has had concerns of steroid induced diabetes hyperglycemia in the past which self resolved.  He has had increased thirst but denies increased urinary frequency. He denies dizziness or changes in vision at this time. He has not had great appetite but has been staying hydrated. These symptoms began Monday of this week.   He did receive 1 L IVF and 15 units of NovoLog yesterday at his oncologist's office when his blood glucose was noted to be 496. This  decreased his glucose level to 378. Metabolic panel and clinical presentation were not significant for DKA. He was allowed to return home and advised to make healthy meal choices.   This morning his random blood sugar was 221 and a HbA1c resulted as 9.2%. He has not had food yet today and had chicken and sauteed vegetables for dinner last night. He says that he does have more energy today compared to earlier in the week, in addition to feeling somewhat more like himself. He previously had a strong fruity odor to his breath but this has improved significantly. He denies nausea, vomiting, or diarrhea. He has some tingling in his finger tips. Assessment:A rare side effect of Mr. Dirico current infusion therapy is induction of type 1 diabetes, however he has been receiving this medication every 3 weeks since December 2021. He does endorse family history in his mother and sister of diabetes but which type is unclear. Given his age I would not expect type 1 diabetes onset spontaneously. Given his HbA1c and the above information I suspect that he more likely has type 2 diabetes mellitus. I do not have concern at this time that he is in DKA.  Plan:Start Levemir 10 units daily and metformin 500 mg daily. Plan to titrate up both of these medications at follow-up in 2 weeks. I have asked that he check his blood sugar three times daily before meals and keep a log of these readings. He is open to the possibility of a CGM and discussion  further at his follow up visit. Given his weight I have deferred starting a third agent such as a GLP-1 receptor agonist at this time but consider starting this in the future if indicated to titrate him off of insulin. Consider checking autoantibodies in the future if continued suspicion of type 1 versus type 2 diabetes.  Patient discussed with Dr. Philipp Ovens

## 2022-03-01 NOTE — Addendum Note (Signed)
Addended by: Jodean Lima on: 03/01/2022 05:07 PM   Modules accepted: Level of Service

## 2022-03-01 NOTE — Progress Notes (Signed)
Internal Medicine Clinic Attending ? ?Case discussed with Dr. Dean  At the time of the visit.  We reviewed the resident?s history and exam and pertinent patient test results.  I agree with the assessment, diagnosis, and plan of care documented in the resident?s note.  ?

## 2022-03-01 NOTE — Patient Instructions (Signed)
Jared Shea,  It was a pleasure to care for you today.  I am starting you on two new medications to get your blood sugar under better control: Levemir 10 units injected once daily at bedtime and metformin 500 mg tablet by mouth daily. I would like you to come back to the clinic in about 2 weeks so we can check how these medicines are improving your sugar. I would also like you to meet with Butch Penny, our diabetes coordinator, at that time as well. Please check your sugar three times daily before breakfast, lunch, and dinner, and keep a log of these numbers for [place] to see at this visit.  My best, Dr. Marlou Sa

## 2022-03-15 ENCOUNTER — Encounter: Payer: Medicare HMO | Admitting: Student

## 2022-03-16 ENCOUNTER — Ambulatory Visit (INDEPENDENT_AMBULATORY_CARE_PROVIDER_SITE_OTHER): Payer: Medicare HMO | Admitting: Student

## 2022-03-16 ENCOUNTER — Encounter: Payer: Self-pay | Admitting: Student

## 2022-03-16 ENCOUNTER — Ambulatory Visit (INDEPENDENT_AMBULATORY_CARE_PROVIDER_SITE_OTHER): Payer: Medicare HMO

## 2022-03-16 ENCOUNTER — Other Ambulatory Visit: Payer: Self-pay

## 2022-03-16 VITALS — BP 140/69 | HR 84 | Temp 97.5°F | Ht 73.0 in | Wt 184.6 lb

## 2022-03-16 DIAGNOSIS — Z87891 Personal history of nicotine dependence: Secondary | ICD-10-CM | POA: Diagnosis not present

## 2022-03-16 DIAGNOSIS — I10 Essential (primary) hypertension: Secondary | ICD-10-CM | POA: Diagnosis not present

## 2022-03-16 DIAGNOSIS — Z0000 Encounter for general adult medical examination without abnormal findings: Secondary | ICD-10-CM

## 2022-03-16 DIAGNOSIS — E119 Type 2 diabetes mellitus without complications: Secondary | ICD-10-CM

## 2022-03-16 DIAGNOSIS — Z794 Long term (current) use of insulin: Secondary | ICD-10-CM | POA: Diagnosis not present

## 2022-03-16 MED ORDER — DEXCOM G7 RECEIVER DEVI: 0 refills | Status: DC

## 2022-03-16 MED ORDER — GLUCOSE BLOOD VI STRP: ORAL_STRIP | 12 refills | Status: DC

## 2022-03-16 MED ORDER — METFORMIN HCL 500 MG PO TABS: 1000.0000 mg | ORAL_TABLET | Freq: Two times a day (BID) | ORAL | 11 refills | Status: DC

## 2022-03-16 MED ORDER — DEXCOM G7 SENSOR MISC: 11 refills | Status: DC

## 2022-03-16 NOTE — Progress Notes (Signed)
CC: Diabetes follow-up  HPI:  [name] is a 66 y.o. male living with a history stated below and presents today for diabetes follow-up. Please see problem based assessment and plan for additional details.  Past Medical History:  Diagnosis Date   Brain mass    Bursitis of right hip    Chest pain 08/12/2018   Chronic cough    Dependence on nicotine from cigarettes    Diabetes mellitus without complication ([place])    Essential hypertension 07/28/2018   nscl ca dx'd 12/2019   Seizures ([place])    Viral illness 03/23/2019    Current Outpatient Medications on File Prior to Visit  Medication Sig Dispense Refill   albuterol (VENTOLIN HFA) 108 (90 Base) MCG/ACT inhaler Inhale 2 puffs into the lungs every 6 (six) hours as needed for wheezing or shortness of breath. 8 g 2   benzonatate (TESSALON) 100 MG capsule Take 1 capsule (100 mg total) by mouth 3 (three) times daily as needed for cough. 30 capsule 2   blood glucose meter kit and supplies KIT Dispense based on patient and insurance preference. Use up to three times daily as directed. 1 each 0   celecoxib (CELEBREX) 200 MG capsule Take 1 capsule (200 mg total) by mouth 2 (two) times daily. 30 capsule 3   HYDROcodone-acetaminophen (NORCO) 5-325 MG tablet Take 1 tablet by mouth every 6 (six) hours as needed for moderate pain. 30 tablet 0   insulin detemir (LEVEMIR) 100 UNIT/ML injection Inject 0.1 mLs (10 Units total) into the skin daily. 10 mL 11   Insulin Syringes, Disposable, U-100 0.3 ML MISC 0.1 mLs by Does not apply route at bedtime. 100 each 9   levETIRAcetam (KEPPRA) 750 MG tablet Take 1 tablet (750 mg total) by mouth 2 (two) times daily. 180 tablet 4   nortriptyline (PAMELOR) 25 MG capsule TAKE 2 CAPSULES(50 MG) BY MOUTH AT BEDTIME 60 capsule 5   omeprazole (PRILOSEC) 20 MG capsule Take 1 capsule (20 mg total) by mouth daily. Pt takes in the morning 90 capsule 1   potassium chloride SA (KLOR-CON M) 20 MEQ tablet Take 1 tablet (20  mEq total) by mouth daily. 7 tablet 0   sildenafil (VIAGRA) 50 MG tablet Take 2 tablets (100 mg total) by mouth daily as needed for erectile dysfunction. 30 tablet 3   No current facility-administered medications on file prior to visit.   Review of Systems: ROS negative except for what is noted on the assessment and plan.  Vitals:   03/16/22 0913 03/16/22 0928 03/16/22 1028  BP: (!) 147/70 (!) 143/74 (!) 140/69  Pulse: 90 85 84  Temp: (!) 97.5 F (36.4 C)    TempSrc: Oral    SpO2: 95%    Weight: 184 lb 9.6 oz (83.7 kg)    Height: _0  (1.854 m)     Physical Exam: Constitutional: well-appearing male, sitting in chair, in no acute distress HENT: normocephalic atraumatic Neck: supple Cardiovascular: regular rate and rhythm, no m/r/g Pulmonary/Chest: normal work of breathing on room air, lungs clear to auscultation bilaterally MSK: normal bulk and tone Neurological: alert & oriented x 3 Skin: warm and dry Psych: normal mood and behavior  Assessment & Plan:   Essential hypertension BP: (!) 140/69  BP today improved on repeat but still elevated. He is on losartan 25 mg daily. Per caregiver, it appears he did not take the losartan this morning. He is asymptomatic.   Plan -Continue losartan and encouraged patient to  take it daily  Diabetes mellitus ([place]) Patient with A1c 9.2.  He is on Levemir 10 units daily and metformin 500 mg daily.  Tolerating both medications with no side effects.  He has a glucometer which he measures a few times a day.  His fasting glucose is around 120-130.  Denies any readings or symptoms of hypoglycemia. Discussed with patient about increasing metformin dose gradually with possible long term goal of tapering off insulin. Could consider addition of SGLT2 at future visit. Patient would also benefit with CGM and referral to see Jared Shea for diabetes education.   Plan -Continue Levemir 10 units daily -Will gradually titrate up metformin to 1000 mg twice  daily -Dexcom G7 sensor and receiver -Referral to nutrition and diabetes Jared Shea) -Referral to ophthalmology -Urine microalbumin creatinine today -repeat A1c in 2 months  Health care maintenance Patient deferred flu and tetanus shots today. Discussed with him the risks and benefits of receiving both vaccines. He is willing to consider both at his next visit. Reassess at next visit.    Patient seen with Dr. Ronna Polio, D.O. [facility], PGY-1 Phone: [phone] Date 03/16/2022 Time 7:08 PM

## 2022-03-16 NOTE — Patient Instructions (Addendum)
Thank you, Jared Shea. for allowing [place] to provide your care today.   Diabetes -Continue with Levemir 10 units daily -Increase metformin gradually. Start with 500 mg twice daily for 1 week. If tolerating dose then increase to 1000 mg and 500 mg daily for 1 week. Then increase to max 1000 mg twice daily.  -Refilled meter test strips -Dexcom G7 sensor and receiver sent to pharmacy -Referral to ophthalmology sent -Referral to see Butch Penny regarding diabetes management -urine today to check for proteins -If you notice any hypoglycemia or low blood sugar, please contact us at the clinic.   Blood Pressure -Improved after recheck. Please make sure to take your Losartan 25 mg each day.   -Plan for flu and tetanus shot at next visit.   I have ordered the following labs for you:  Lab Orders         Microalbumin / Creatinine Urine Ratio       Referrals ordered today:   Referral Orders         Ambulatory referral to Ophthalmology         Referral to Nutrition and Diabetes Services       I have ordered the following medication/changed the following medications:   Stop the following medications: Medications Discontinued During This Encounter  Medication Reason   losartan (COZAAR) 25 MG tablet    metFORMIN (GLUCOPHAGE-XR) 500 MG 24 hr tablet      Start the following medications: Meds ordered this encounter  Medications   Continuous Blood Gluc Sensor (DEXCOM G7 SENSOR) MISC    Sig: Please use as directed. Place on skin for glucose monitoring. Change sensors every 10 days.    Dispense:  3 each    Refill:  11   Continuous Blood Gluc Receiver (DEXCOM G7 RECEIVER) DEVI    Sig: Please use as directed to check your blood sugar.    Dispense:  1 each    Refill:  0   metFORMIN (GLUCOPHAGE) 500 MG tablet    Sig: Take 2 tablets (1,000 mg total) by mouth 2 (two) times daily with a meal. Start taking 1 tablet (500 mg) by mouth 2 (two) times daily for 1 week, increase by 500 mg so total 1500  mg for 1 week, then increase to 2 tablets (1000 mg) by mouth 2 (two) times daily    Dispense:  120 tablet    Refill:  11   glucose blood test strip    Sig: Use as instructed. Please check your blood sugar in the morning and up to 3 times during the day.    Dispense:  100 each    Refill:  12     Follow up:  1 month    Remember: Please bring your glucose meter at next visit or if you have received the Dexcom.   Should you have any questions or concerns please call the internal medicine clinic at [phone].    [name], D.O. [place]

## 2022-03-16 NOTE — Assessment & Plan Note (Signed)
Patient deferred flu and tetanus shots today. Discussed with him the risks and benefits of receiving both vaccines. He is willing to consider both at his next visit. Reassess at next visit.

## 2022-03-16 NOTE — Assessment & Plan Note (Addendum)
BP: (!) 140/69  BP today improved on repeat but still elevated. He is on losartan 25 mg daily. Per caregiver, it appears he did not take the losartan this morning. He is asymptomatic.   Plan -Continue losartan and encouraged patient to take it daily

## 2022-03-16 NOTE — Progress Notes (Signed)
Subjective:   Jared Shea. is a 66 y.o. male who presents for an Initial Medicare Annual Wellness Visit. I connected with  Jared Shea. on 03/16/22 by a  IN PERSON  [place]     Patient Location: Other:  IN PERSON [place]  Provider Location: Office/Clinic  I discussed the limitations of evaluation and management by telemedicine. The patient expressed understanding and agreed to proceed.   Review of Systems    DEFERRED TO PCP        Objective:    There were no vitals filed for this visit. There is no height or weight on file to calculate BMI.     03/16/2022    9:28 AM 03/01/2022    9:26 AM 01/16/2022    1:54 PM 01/08/2022    9:35 AM 12/25/2021   10:05 AM 10/02/2021    2:27 PM 09/11/2021   11:48 AM  Advanced Directives  Does Patient Have a Medical Advance Directive? Yes _0  No  Type of Advance Directive Living will        Does patient want to make changes to medical advance directive?      No - Patient declined No - Patient declined  Copy of [place] in Chart?       No - copy requested  Would patient like information on creating a medical advance directive?  No - Patient declined  No - Patient declined   No - Patient declined    Current Medications (verified) Outpatient Encounter Medications as of 03/16/2022  Medication Sig   albuterol (VENTOLIN HFA) 108 (90 Base) MCG/ACT inhaler Inhale 2 puffs into the lungs every 6 (six) hours as needed for wheezing or shortness of breath.   benzonatate (TESSALON) 100 MG capsule Take 1 capsule (100 mg total) by mouth 3 (three) times daily as needed for cough.   blood glucose meter kit and supplies KIT Dispense based on patient and insurance preference. Use up to three times daily as directed.   celecoxib (CELEBREX) 200 MG capsule Take 1 capsule (200 mg total) by mouth 2 (two) times daily.   HYDROcodone-acetaminophen (NORCO) 5-325 MG tablet Take 1 tablet by mouth every 6 (six) hours as needed for  moderate pain.   insulin detemir (LEVEMIR) 100 UNIT/ML injection Inject 0.1 mLs (10 Units total) into the skin daily.   Insulin Syringes, Disposable, U-100 0.3 ML MISC 0.1 mLs by Does not apply route at bedtime.   levETIRAcetam (KEPPRA) 750 MG tablet Take 1 tablet (750 mg total) by mouth 2 (two) times daily.   losartan (COZAAR) 25 MG tablet Take 1 tablet (25 mg total) by mouth daily.   metFORMIN (GLUCOPHAGE-XR) 500 MG 24 hr tablet Take 1 tablet (500 mg total) by mouth daily with breakfast.   nortriptyline (PAMELOR) 25 MG capsule TAKE 2 CAPSULES(50 MG) BY MOUTH AT BEDTIME   omeprazole (PRILOSEC) 20 MG capsule Take 1 capsule (20 mg total) by mouth daily. Pt takes in the morning   potassium chloride SA (KLOR-CON M) 20 MEQ tablet Take 1 tablet (20 mEq total) by mouth daily.   sildenafil (VIAGRA) 50 MG tablet Take 2 tablets (100 mg total) by mouth daily as needed for erectile dysfunction.   No facility-administered encounter medications on file as of 03/16/2022.    Allergies (verified) Penicillins   History: Past Medical History:  Diagnosis Date   Brain mass    Bursitis of right hip    Chest pain 08/12/2018  Chronic cough    Dependence on nicotine from cigarettes    Diabetes mellitus without complication ([place])    Essential hypertension 07/28/2018   nscl ca dx'd 12/2019   Seizures ([place])    Viral illness 03/23/2019   Past Surgical History:  Procedure Laterality Date   APPLICATION OF CRANIAL NAVIGATION N/A 01/07/2020   Procedure: APPLICATION OF CRANIAL NAVIGATION;  Surgeon: Ashok Pall, MD;  Location: [place];  Service: Neurosurgery;  Laterality: N/A;   CRANIOTOMY Right 01/07/2020   Procedure: RIGHT CRANIOTOMY FOR TUMOR RESECTION;  Surgeon: Ashok Pall, MD;  Location: [facility];  Service: Neurosurgery;  Laterality: Right;  rm 21   ENDOBRONCHIAL ULTRASOUND N/A 12/21/2019   Procedure: ENDOBRONCHIAL ULTRASOUND;  Surgeon: Laurin Coder, MD;  Location: [facility];  Service: Pulmonary;   Laterality: N/A;   FINE NEEDLE ASPIRATION  12/21/2019   Procedure: FINE NEEDLE ASPIRATION (FNA) LINEAR;  Surgeon: Laurin Coder, MD;  Location: [facility];  Service: Pulmonary;;   IR IMAGING GUIDED PORT INSERTION  03/03/2020   NO PAST SURGERIES     VIDEO BRONCHOSCOPY N/A 12/21/2019   Procedure: VIDEO BRONCHOSCOPY WITHOUT FLUORO;  Surgeon: Laurin Coder, MD;  Location: [facility];  Service: Pulmonary;  Laterality: N/A;   Family History  Problem Relation Age of Onset   Heart disease Mother        CABG   Kidney disease Sister    Diabetes Brother    Kidney disease Brother        KIDNEY TRANSPLANT   Hypertension Sister    Healthy Daughter    Healthy Daughter    Healthy Son    Social History   Socioeconomic History   Marital status: Divorced    Spouse name: Not on file   Number of children: Not on file   Years of education: Not on file   Highest education level: Not on file  Occupational History   Not on file  Tobacco Use   Smoking status: Former    Packs/day: 1.00    Years: 48.00    Total pack years: 48.00    Types: Cigarettes    Start date: 08/07/1970    Quit date: 12/15/2019    Years since quitting: 2.2   Smokeless tobacco: Never  Vaping Use   Vaping Use: Never used  Substance and Sexual Activity   Alcohol use: Not Currently   Drug use: Never   Sexual activity: Not on file    Comment: YES  Other Topics Concern   Not on file  Social History Narrative   Not on file   Social Determinants of Health   Financial Resource Strain: High Risk (12/24/2019)   Overall Financial Resource Strain (CARDIA)    Difficulty of Paying Living Expenses: Hard  Food Insecurity: No Food Insecurity (12/24/2019)   Hunger Vital Sign    Worried About Running Out of Food in the Last Year: Never true    Ran Out of Food in the Last Year: Never true  Transportation Needs: Unmet Transportation Needs (12/24/2019)   PRAPARE - Hydrologist (Medical): Yes    Lack  of Transportation (Non-Medical): Yes  Physical Activity: Not on file  Stress: Not on file  Social Connections: Not on file    Tobacco Counseling Counseling given: Not Answered   Clinical Intake:                 Diabetic?YES          Activities of Daily Living  03/16/2022    9:27 AM 03/01/2022    9:27 AM  In your present state of health, do you have any difficulty performing the following activities:  Hearing? 0 0  Vision? 1 1  Comment  blurry vision  Difficulty concentrating or making decisions? 1 1  Walking or climbing stairs? 0 1  Dressing or bathing? 0 0  Doing errands, shopping? 0 0    Patient Care Team: [name], DO as PCP - General  Indicate any recent Medical Services you may have received from other than [facility] providers in the past year (date may be approximate).     Assessment:   This is a routine wellness examination for [facility].  Hearing/Vision screen No results found.  Dietary issues and exercise activities discussed:     Goals Addressed   None   Depression Screen    03/16/2022    9:24 AM 03/01/2022    9:28 AM 09/07/2021    2:33 PM 07/11/2021    9:04 AM 04/03/2021    1:41 PM 03/23/2021    2:38 PM 09/20/2020    9:11 AM  PHQ 2/9 Scores  PHQ - 2 Score 2 1 0 0 0 3 1  PHQ- 9 Score 7 6 0 0 _0 Fall Risk    03/16/2022    9:23 AM 09/07/2021    1:23 PM 07/11/2021    9:04 AM 04/03/2021    1:39 PM 03/23/2021    1:33 PM  Fall Risk   Falls in the past year? 0 0 0 0 0  Number falls in past yr: 0 0 0    Injury with Fall? 0 0 0    Risk for fall due to : No Fall Risks  No Fall Risks No Fall Risks No Fall Risks  Follow up Falls evaluation completed;Falls prevention discussed Falls evaluation completed Falls evaluation completed;Falls prevention discussed Falls evaluation completed Falls evaluation completed    FALL RISK PREVENTION PERTAINING TO THE HOME:  Any stairs in or around the home? No  If so, are there any without  handrails? No  Home free of loose throw rugs in walkways, pet beds, electrical cords, etc? Yes  Adequate lighting in your home to reduce risk of falls? Yes   ASSISTIVE DEVICES UTILIZED TO PREVENT FALLS:  Life alert? No  Use of a cane, walker or w/c? No  Grab bars in the bathroom? No  Shower chair or bench in shower? No  Elevated toilet seat or a handicapped toilet? No   TIMED UP AND GO:  Was the test performed? No .  Length of time to ambulate 10 feet: N/A sec.     Cognitive Function:      03/14/2021   11:00 AM  Montreal Cognitive Assessment   Visuospatial/ Executive (0/5) 2  Naming (0/3) 3  Attention: Read list of digits (0/2) 2  Attention: Read list of letters (0/1) 1  Attention: Serial 7 subtraction starting at 100 (0/3) 0  Language: Repeat phrase (0/2) 1  Language : Fluency (0/1) 0  Abstraction (0/2) 1  Delayed Recall (0/5) 0  Orientation (0/6) 3  Total 13      Immunizations Immunization History  Administered Date(s) Administered   PFIZER(Purple Top)SARS-COV-2 Vaccination 08/10/2019, 09/10/2019   PNEUMOCOCCAL CONJUGATE-20 07/11/2021    TDAP status: Due, Education has been provided regarding the importance of this vaccine. Advised may receive this vaccine at local pharmacy or Health Dept. Aware to provide a copy of the vaccination record  if obtained from local pharmacy or Health Dept. Verbalized acceptance and understanding.  Flu Vaccine status: Due, Education has been provided regarding the importance of this vaccine. Advised may receive this vaccine at local pharmacy or Health Dept. Aware to provide a copy of the vaccination record if obtained from local pharmacy or Health Dept. Verbalized acceptance and understanding.  Pneumococcal vaccine status: Up to date  Covid-19 vaccine status: Completed vaccines  Qualifies for Shingles Vaccine? Yes   Zostavax completed No   Shingrix Completed?: No.    Education has been provided regarding the importance of this  vaccine. Patient has been advised to call insurance company to determine out of pocket expense if they have not yet received this vaccine. Advised may also receive vaccine at local pharmacy or Health Dept. Verbalized acceptance and understanding.  Screening Tests Health Maintenance  Topic Date Due   OPHTHALMOLOGY EXAM  Never done   Diabetic kidney evaluation - Urine ACR  Never done   Hepatitis C Screening  Never done   TETANUS/TDAP  Never done   Zoster Vaccines- Shingrix (1 of 2) Never done   COLONOSCOPY (Pts 45-16yr Insurance coverage will need to be confirmed)  Never done   COVID-19 Vaccine (3 - Pfizer risk series) 10/08/2019   INFLUENZA VACCINE  Never done   HEMOGLOBIN A1C  08/31/2022   Diabetic kidney evaluation - GFR measurement  03/01/2023   Medicare Annual Wellness (AWV)  03/17/2023   Pneumonia Vaccine 66 Years old  Completed   HPV VACCINES  Aged Out   FOOT EXAM  Discontinued    Health Maintenance  Health Maintenance Due  Topic Date Due   OPHTHALMOLOGY EXAM  Never done   Diabetic kidney evaluation - Urine ACR  Never done   Hepatitis C Screening  Never done   TETANUS/TDAP  Never done   Zoster Vaccines- Shingrix (1 of 2) Never done   COLONOSCOPY (Pts 45-43yrInsurance coverage will need to be confirmed)  Never done   COVID-19 Vaccine (3 - Pfizer risk series) 10/08/2019   INFLUENZA VACCINE  Never done   Colorectal cancer screening: Referral to GI placed DEFERRED TO PCP .  Lung Cancer Screening: (Low Dose CT Chest recommended if Age 68-80ears, 30 pack-year currently smoking OR have quit w/in 15years.) does not qualify.   Lung Cancer Screening Referral: DEFERRD   Additional Screening:  Hepatitis C Screening: does qualify; DEFERRED TO PCP    Vision Screening: Recommended annual ophthalmology exams for early detection of glaucoma and other disorders of the eye. Is the patient up to date with their annual eye exam?  Yes  Who is the provider or what is the name of  the office in which the patient attends annual eye exams? Pt does not recall his Dr name  If pt is not established with a provider, would they like to be referred to a provider to establish care? No .   Dental Screening: Recommended annual dental exams for proper oral hygiene  Community Resource Referral / Chronic Care Management: CRR required this visit?  No   CCM required this visit?  No      Plan:     I have personally reviewed and noted the following in the patient's chart:   Medical and social history Use of alcohol, tobacco or illicit drugs  Current medications and supplements including opioid prescriptions. Patient is not currently taking opioid prescriptions. Functional ability and status Nutritional status Physical activity Advanced directives List of other physicians Hospitalizations, surgeries, and ER  visits in previous 12 months Vitals Screenings to include cognitive, depression, and falls Referrals and appointments  In addition, I have reviewed and discussed with patient certain preventive protocols, quality metrics, and best practice recommendations. A written personalized care plan for preventive services as well as general preventive health recommendations were provided to patient.     Judyann Munson, CMA   03/16/2022   Nurse Notes: IN PERSON [place]   Mr. Geiger , Thank you for taking time to come for your Medicare Wellness Visit. I appreciate your ongoing commitment to your health goals. Please review the following plan we discussed and let me know if I can assist you in the future.   These are the goals we discussed:  Goals   None     This is a list of the screening recommended for you and due dates:  Health Maintenance  Topic Date Due   Eye exam for diabetics  Never done   Yearly kidney health urinalysis for diabetes  Never done   Hepatitis C Screening: USPSTF Recommendation to screen - Ages 37-79 yo.  Never done   Tetanus Vaccine  Never done    Zoster (Shingles) Vaccine (1 of 2) Never done   Colon Cancer Screening  Never done   COVID-19 Vaccine (3 - Pfizer risk series) 10/08/2019   Flu Shot  Never done   Hemoglobin A1C  08/31/2022   Yearly kidney function blood test for diabetes  03/01/2023   Medicare Annual Wellness Visit  03/17/2023   Pneumonia Vaccine  Completed   HPV Vaccine  Aged Out   Complete foot exam   Discontinued

## 2022-03-16 NOTE — Patient Instructions (Signed)
Health Maintenance, Male Adopting a healthy lifestyle and getting preventive care are important in promoting health and wellness. Ask your health care provider about: The right schedule for you to have regular tests and exams. Things you can do on your own to prevent diseases and keep yourself healthy. What should I know about diet, weight, and exercise? Eat a healthy diet  Eat a diet that includes plenty of vegetables, fruits, low-fat dairy products, and lean protein. Do not eat a lot of foods that are high in solid fats, added sugars, or sodium. Maintain a healthy weight Body mass index (BMI) is a measurement that can be used to identify possible weight problems. It estimates body fat based on height and weight. Your health care provider can help determine your BMI and help you achieve or maintain a healthy weight. Get regular exercise Get regular exercise. This is one of the most important things you can do for your health. Most adults should: Exercise for at least 150 minutes each week. The exercise should increase your heart rate and make you sweat (moderate-intensity exercise). Do strengthening exercises at least twice a week. This is in addition to the moderate-intensity exercise. Spend less time sitting. Even light physical activity can be beneficial. Watch cholesterol and blood lipids Have your blood tested for lipids and cholesterol at 68 years of age, then have this test every 5 years. You may need to have your cholesterol levels checked more often if: Your lipid or cholesterol levels are high. You are older than 68 years of age. You are at high risk for heart disease. What should I know about cancer screening? Many types of cancers can be detected early and may often be prevented. Depending on your health history and family history, you may need to have cancer screening at various ages. This may include screening for: Colorectal cancer. Prostate cancer. Skin cancer. Lung  cancer. What should I know about heart disease, diabetes, and high blood pressure? Blood pressure and heart disease High blood pressure causes heart disease and increases the risk of stroke. This is more likely to develop in people who have high blood pressure readings or are overweight. Talk with your health care provider about your target blood pressure readings. Have your blood pressure checked: Every 3-5 years if you are 18-39 years of age. Every year if you are 40 years old or older. If you are between the ages of 65 and 75 and are a current or former smoker, ask your health care provider if you should have a one-time screening for abdominal aortic aneurysm (AAA). Diabetes Have regular diabetes screenings. This checks your fasting blood sugar level. Have the screening done: Once every three years after age 45 if you are at a normal weight and have a low risk for diabetes. More often and at a younger age if you are overweight or have a high risk for diabetes. What should I know about preventing infection? Hepatitis B If you have a higher risk for hepatitis B, you should be screened for this virus. Talk with your health care provider to find out if you are at risk for hepatitis B infection. Hepatitis C Blood testing is recommended for: Everyone born from 1945 through 1965. Anyone with known risk factors for hepatitis C. Sexually transmitted infections (STIs) You should be screened each year for STIs, including gonorrhea and chlamydia, if: You are sexually active and are younger than 68 years of age. You are older than 68 years of age and your   health care provider tells you that you are at risk for this type of infection. Your sexual activity has changed since you were last screened, and you are at increased risk for chlamydia or gonorrhea. Ask your health care provider if you are at risk. Ask your health care provider about whether you are at high risk for HIV. Your health care provider  may recommend a prescription medicine to help prevent HIV infection. If you choose to take medicine to prevent HIV, you should first get tested for HIV. You should then be tested every 3 months for as long as you are taking the medicine. Follow these instructions at home: Alcohol use Do not drink alcohol if your health care provider tells you not to drink. If you drink alcohol: Limit how much you have to 0-2 drinks a day. Know how much alcohol is in your drink. In the [place] one drink equals one 12 oz bottle of beer (355 mL), one 5 oz glass of wine (148 mL), or one 1 oz glass of hard liquor (44 mL). Lifestyle Do not use any products that contain nicotine or tobacco. These products include cigarettes, chewing tobacco, and vaping devices, such as e-cigarettes. If you need help quitting, ask your health care provider. Do not use street drugs. Do not share needles. Ask your health care provider for help if you need support or information about quitting drugs. General instructions Schedule regular health, dental, and eye exams. Stay current with your vaccines. Tell your health care provider if: You often feel depressed. You have ever been abused or do not feel safe at home. Summary Adopting a healthy lifestyle and getting preventive care are important in promoting health and wellness. Follow your health care provider's instructions about healthy diet, exercising, and getting tested or screened for diseases. Follow your health care provider's instructions on monitoring your cholesterol and blood pressure. This information is not intended to replace advice given to you by your health care provider. Make sure you discuss any questions you have with your health care provider. Document Revised: 09/19/2020 Document Reviewed: 09/19/2020 Elsevier Patient Education  2023 [employer]  

## 2022-03-16 NOTE — Addendum Note (Signed)
Addended by: [name] on: 03/16/2022 07:38 PM   Modules accepted: Level of Service

## 2022-03-16 NOTE — Progress Notes (Unsigned)
[facility] OFFICE PROGRESS NOTE  [name], DO [address] [place] [id number]  DIAGNOSIS:  Stage IV (TX, N2, M1 C) non-small cell lung cancer, adenocarcinoma diagnosed in August 2021 and presented with solitary brain metastasis in addition to mediastinal lymphadenopathy.   PDL1 Expression 70%   Molecular Biomarkers:  Tumor Mutational Burden - 52 Muts/Mb Microsatellite status - MS-Stable Genomic Findings For a complete list of the genes assayed, please refer to the Appendix. NF1 E1694* MTAP loss exons 2-8 RICTOR amplification ATRX A419V BRAF K483E CDKN2A/B CDKN2A loss, CDKN2B loss DNMT3A E205* FGF10 amplification NTRK1 amplification - equivocal? 7 Disease relevant genes with no reportable alterations: ALK, EGFR, ERBB2, KRAS, MET, RET, ROS1   PRIOR THERAPY: 1) Status post right craniotomy with tumor resection followed by [facility] to solitary brain metastasis under the care of Dr. Lisbeth Renshaw and Dr. Sherral Hammers. 2) Concurrent chemoradiation with weekly carboplatin for AUC of 2 and paclitaxel 45 mg/M2.  First dose 02/01/2020. Status post 5 cycles.  Last dose was given February 29, 2020.  CURRENT THERAPY:  First-line treatment with immunotherapy with Libtayo (Cempilimab) 350 mg IV every 3 weeks.  First dose April 25, 2020.  Status post 33 cycles.   INTERVAL HISTORY: [place]. 66 y.o. male returns to the clinic today for a follow-up visit.  The patient was last seen by myself on 02/27/22.  The patient was feeling fairly well today that day but his blood sugar was significantly elevated, we were concerned that the patient developed diabetes.  He did not have any evidence of DKA.  He was able to establish with his primary care office and he is currently started on diabetic medications.  The patient denies any polydipsia, polyuria, abdominal pain, nausea, vomiting, or altered mental status. There was concern that the patient may have developed type 1 diabetes due to his  immunotherapy use which in rare situations can cause immunotherapy-induced type 1 diabetes due to destruction of the pancreatic beta cells.  However, the patient does have family history of type 2 diabetes and his PCP felt that there is more likely type 2 diabetes although they may consider checking autoantibodies in the future if continued suspicion.  He was started on treatment with insulin and metformin. Overall, he is monitoring his BS at home and it has improved since starting on treatment. The patient and I discussed diabetes at length during his appointment today. His brother was on dialysis and his mother had amputations and visual damage from her diabetes. He is wondering what else he can do from his standpoint to help his diabetes.   Otherwise, the patient is tolerating his treatment well.  He denies fever, chills, or night sweats. It does appear he has lost some weight recently which we will monitor. He has some occasional shortness of breath if he flexes his neck forward. He states he no longer has a cough..  He denies chest discomfort or hemoptysis. Denies any nausea, vomiting, diarrhea, or constipation.  Denies any rashes or skin changes. He is followed closely by Dr. Mickeal Skinner for his history of metastatic disease to the brain.  He is here today for evaluation before starting cycle #34.       MEDICAL HISTORY: Past Medical History:  Diagnosis Date   Brain mass    Bursitis of right hip    Chest pain 08/12/2018   Chronic cough    Dependence on nicotine from cigarettes    Diabetes mellitus without complication ([place])  Essential hypertension 07/28/2018   nscl ca dx'd 12/2019   Seizures ([place])    Viral illness 03/23/2019    ALLERGIES:  is allergic to penicillins.  MEDICATIONS:  Current Outpatient Medications  Medication Sig Dispense Refill   albuterol (VENTOLIN HFA) 108 (90 Base) MCG/ACT inhaler Inhale 2 puffs into the lungs every 6 (six) hours as needed for wheezing or shortness of  breath. 8 g 2   blood glucose meter kit and supplies KIT Dispense based on patient and insurance preference. Use up to three times daily as directed. 1 each 0   celecoxib (CELEBREX) 200 MG capsule Take 1 capsule (200 mg total) by mouth 2 (two) times daily. 30 capsule 3   Continuous Blood Gluc Receiver ([place]) DEVI Please use as directed to check your blood sugar. 1 each 0   Continuous Blood Gluc Sensor (DEXCOM G7 SENSOR) MISC Please use as directed. Place on skin for glucose monitoring. Change sensors every 10 days. 3 each 11   glucose blood test strip Use as instructed. Please check your blood sugar in the morning and up to 3 times during the day. 100 each 12   HYDROcodone-acetaminophen (NORCO) 5-325 MG tablet Take 1 tablet by mouth every 6 (six) hours as needed for moderate pain. 30 tablet 0   insulin detemir (LEVEMIR) 100 UNIT/ML injection Inject 0.1 mLs (10 Units total) into the skin daily. 10 mL 11   Insulin Syringes, Disposable, U-100 0.3 ML MISC 0.1 mLs by Does not apply route at bedtime. 100 each 9   levETIRAcetam (KEPPRA) 750 MG tablet Take 1 tablet (750 mg total) by mouth 2 (two) times daily. 180 tablet 4   metFORMIN (GLUCOPHAGE) 500 MG tablet Take 2 tablets (1,000 mg total) by mouth 2 (two) times daily with a meal. Start taking 1 tablet (500 mg) by mouth 2 (two) times daily for 1 week, increase by 500 mg so total 1500 mg for 1 week, then increase to 2 tablets (1000 mg) by mouth 2 (two) times daily 120 tablet 11   nortriptyline (PAMELOR) 25 MG capsule TAKE 2 CAPSULES(50 MG) BY MOUTH AT BEDTIME 60 capsule 5   omeprazole (PRILOSEC) 20 MG capsule Take 1 capsule (20 mg total) by mouth daily. Pt takes in the morning 90 capsule 1   potassium chloride SA (KLOR-CON M) 20 MEQ tablet Take 1 tablet (20 mEq total) by mouth daily. 7 tablet 0   sildenafil (VIAGRA) 50 MG tablet Take 2 tablets (100 mg total) by mouth daily as needed for erectile dysfunction. 30 tablet 3   No current  facility-administered medications for this visit.   Facility-Administered Medications Ordered in Other Visits  Medication Dose Route Frequency Provider Last Rate Last Admin   cemiplimab-rwlc (LIBTAYO) 350 mg in sodium chloride 0.9 % 100 mL chemo infusion  350 mg Intravenous Once Curt Bears, MD        SURGICAL HISTORY:  Past Surgical History:  Procedure Laterality Date   APPLICATION OF CRANIAL NAVIGATION N/A 01/07/2020   Procedure: APPLICATION OF CRANIAL NAVIGATION;  Surgeon: Ashok Pall, MD;  Location: [facility];  Service: Neurosurgery;  Laterality: N/A;   CRANIOTOMY Right 01/07/2020   Procedure: RIGHT CRANIOTOMY FOR TUMOR RESECTION;  Surgeon: Ashok Pall, MD;  Location: [facility];  Service: Neurosurgery;  Laterality: Right;  rm 21   ENDOBRONCHIAL ULTRASOUND N/A 12/21/2019   Procedure: ENDOBRONCHIAL ULTRASOUND;  Surgeon: Laurin Coder, MD;  Location: [facility];  Service: Pulmonary;  Laterality: N/A;   FINE NEEDLE ASPIRATION  12/21/2019   Procedure: FINE NEEDLE ASPIRATION (FNA) LINEAR;  Surgeon: Laurin Coder, MD;  Location: [facility];  Service: Pulmonary;;   IR IMAGING GUIDED PORT INSERTION  03/03/2020   NO PAST SURGERIES     VIDEO BRONCHOSCOPY N/A 12/21/2019   Procedure: VIDEO BRONCHOSCOPY WITHOUT FLUORO;  Surgeon: Laurin Coder, MD;  Location: [facility];  Service: Pulmonary;  Laterality: N/A;    REVIEW OF SYSTEMS:   Review of Systems  Constitutional: Negative for appetite change, chills, fatigue, fever and unexpected weight change.  HENT: Negative for mouth sores, nosebleeds, sore throat and trouble swallowing.   Eyes: Negative for eye problems and icterus.  Respiratory: Positive for intermittent shortness of breath (worse if flexes neck). Negative for cough, hemoptysis, and wheezing.   Cardiovascular: Negative for chest pain and leg swelling.  Gastrointestinal: Negative for abdominal pain, constipation, diarrhea, nausea and vomiting.  Genitourinary: Negative for  bladder incontinence, difficulty urinating, dysuria, frequency and hematuria.   Musculoskeletal: Negative for back pain, gait problem, neck pain and neck stiffness.  Skin: Negative for itching and rash.  Neurological: Negative for dizziness, extremity weakness, gait problem, headaches, light-headedness and seizures.  Hematological: Negative for adenopathy. Does not bruise/bleed easily.  Psychiatric/Behavioral: Negative for confusion, depression and sleep disturbance. The patient is not nervous/anxious.      PHYSICAL EXAMINATION:  Blood pressure 137/80, pulse 84, temperature 97.8 F (36.6 C), temperature source Oral, resp. rate 17, height _0  (1.854 m), weight 178 lb 11.2 oz (81.1 kg), SpO2 99 %.  ECOG PERFORMANCE STATUS: 1  Physical Exam  Constitutional: Oriented to person, place, and time and well-developed, well-nourished, and in no distress. No distress.  HENT:  Head: Normocephalic and atraumatic.  Mouth/Throat: Oropharynx is clear and moist. No oropharyngeal exudate.  Eyes: Conjunctivae are normal. Right eye exhibits no discharge. Left eye exhibits no discharge. No scleral icterus.  Neck: Normal range of motion. Neck supple.  Cardiovascular: Normal rate, regular rhythm, normal heart sounds and intact distal pulses.   Pulmonary/Chest: Effort normal. Quiet breath sounds in all lung fields. No respiratory distress. No wheezes. No rales.  Abdominal: Soft. Bowel sounds are normal. Exhibits no distension and no mass. There is no tenderness.  Musculoskeletal: Normal range of motion. Exhibits no edema.  Lymphadenopathy:    No cervical adenopathy.  Neurological: Alert and oriented to person, place, and time. Exhibits normal muscle tone. Gait normal. Coordination normal.  Skin: Skin is warm and dry. No rash noted. Not diaphoretic. No erythema. No pallor.  Psychiatric: Mood, memory and judgment normal.  Vitals reviewed.  LABORATORY DATA: Lab Results  Component Value Date   WBC 6.2  03/20/2022   HGB 12.1 (L) 03/20/2022   HCT 33.3 (L) 03/20/2022   MCV 84.9 03/20/2022   PLT 324 03/20/2022      Chemistry      Component Value Date/Time   NA 140 03/20/2022 1103   NA 140 11/23/2019 1516   K 3.5 03/20/2022 1103   CL 106 03/20/2022 1103   CO2 25 03/20/2022 1103   BUN 12 03/20/2022 1103   BUN 14 11/23/2019 1516   CREATININE 0.72 03/20/2022 1103      Component Value Date/Time   CALCIUM 9.3 03/20/2022 1103   ALKPHOS 67 03/20/2022 1103   AST 23 03/20/2022 1103   ALT 20 03/20/2022 1103   BILITOT 0.7 03/20/2022 1103       RADIOGRAPHIC STUDIES:  No results found.   ASSESSMENT/PLAN:  This is a very pleasant 66 year old African-American male diagnosed  with stage IV (Tx, N2, M1c) non-small cell lung cancer, adenocarcinoma.  He presented with a solitary brain metastasis in addition to right hilar and mediastinal lymphadenopathy.  He was diagnosed in August 2021.     The patient is status post right craniotomy with resection of the solitary brain metastasis with SRS under the care of Dr. Lisbeth Renshaw and Dr. Christella Noa.    He completed weekly concurrent chemoradiation with carboplatin for an AUC of 2 and paclitaxel 45 mg per metered square.  He is status post 5 cycles.  He tolerated it well except for some dysphagia/odynophagia.   The patient then had evidence of new brain metastases.  He is status post SRS to the new lesions on 04/22/2020 under the care of Dr. Lisbeth Renshaw.  The patient is currently undergoing immunotherapy with Libtayo IV every 3 weeks.  He is status post 33 cycles and tolerated it well except for arthralgias.  Previously offered a break from treatment due to his arthralgias.  The patient followed up with orthopedics who found that the patient has degenerative joint disease in his knees.  Since undergoing PT, he reports significant improvement in his pain. He has not needed to take any medications for arthralgias except he will take norco sparingly. He is requesting a  refill today. I discussed I will send one more refill and only to use sparingly. This will be his last refill and he understands this.      Labs were reviewed.  Recommend that he proceed with cycle #34 today scheduled.  We will see him back for follow-up visit in 3 weeks for evaluation repeat blood work before undergoing his last cycle of treatment before arranging for restaging CT scan of the chest, abdomen, and pelvis. He is scheduled to see my colleague at his next appointment on 11/28. This will just be toxicity check prior to cycle #35.   I have went ahead an ordered his next restaging CT scan of the CAP which should be around 12/19. I have requested scheduling make a follow up with me or Dr. Julien Nordmann around 12/21 to review the results and discuss next steps. I reviewed this with the patient today.   The patient will continue to follow with his PCP regarding his newly diagnosed diabetes.  As previously mentioned, immunotherapy can cause type 1 diabetes due to destruction of the pancreatic beta cells.  However, the patient does have a family history of type 2 diabetes.  He is following with his PCP regarding appropriate management of this. His blood sugars have seemed to improve. His BS is 150 today compared to 496 at his last appointment. The patient and I discussed the mechanism of diabetes at length today. He is concerned about diabetes complications since his brother was on dialysis and his mother had visual deficits and amputations from her diabetes. We discussed the importance of exercise.   He lost some weight since last being seen. We will continue to monitor.    The patient was advised to call immediately if she has any concerning symptoms in the interval. The patient voices understanding of current disease status and treatment options and is in agreement with the current care plan. All questions were answered. The patient knows to call the clinic with any problems, questions or concerns.  We can certainly see the patient much sooner if necessary               Orders Placed This Encounter  Procedures   CT Chest W Contrast  Standing Status:   Future    Standing Expiration Date:   03/20/2023    Order Specific Question:   If indicated for the ordered procedure, I authorize the administration of contrast media per Radiology protocol    Answer:   Yes    Order Specific Question:   Preferred imaging location?    Answer:   [facility]   CT Abdomen Pelvis W Contrast    Standing Status:   Future    Standing Expiration Date:   03/20/2023    Order Specific Question:   If indicated for the ordered procedure, I authorize the administration of contrast media per Radiology protocol    Answer:   Yes    Order Specific Question:   Preferred imaging location?    Answer:   [facility]    Order Specific Question:   Is Oral Contrast requested for this exam?    Answer:   Yes, Per Radiology protocol     The total time spent in the appointment was 20-29 minutes.   Yohana Bartha L Tekeisha Hakim, PA-C 03/20/22

## 2022-03-16 NOTE — Assessment & Plan Note (Addendum)
Patient with A1c 9.2.  He is on Levemir 10 units daily and metformin 500 mg daily.  Tolerating both medications with no side effects.  He has a glucometer which he measures a few times a day.  His fasting glucose is around 120-130.  Denies any readings or symptoms of hypoglycemia. Discussed with patient about increasing metformin dose gradually with possible long term goal of tapering off insulin. Could consider addition of SGLT2 at future visit. Patient would also benefit with CGM and referral to see Butch Penny for diabetes education.   Plan -Continue Levemir 10 units daily -Will gradually titrate up metformin to 1000 mg twice daily -Dexcom G7 sensor and receiver -Referral to nutrition and diabetes Butch Penny) -Referral to ophthalmology -Urine microalbumin creatinine today -repeat A1c in 2 months

## 2022-03-16 NOTE — Assessment & Plan Note (Signed)
>>  ASSESSMENT AND PLAN FOR UNCONTROLLED TYPE 2 DIABETES MELLITUS WITH HYPERGLYCEMIA, WITHOUT LONG-TERM CURRENT USE OF INSULIN  ([facility]) WRITTEN ON 03/16/2022  7:08 PM BY Kahlee Metivier, DO  Patient with A1c 9.2.  He is on Levemir  10 units daily and metformin  500 mg daily.  Tolerating both medications with no side effects.  He has a glucometer which he measures a few times a day.  His fasting glucose is around 120-130.  Denies any readings or symptoms of hypoglycemia. Discussed with patient about increasing metformin  dose gradually with possible long term goal of tapering off insulin . Could consider addition of SGLT2 at future visit. Patient would also benefit with CGM and referral to see [name] for diabetes education.   Plan -Continue Levemir  10 units daily -Will gradually titrate up metformin  to 1000 mg twice daily -Dexcom G7 sensor and receiver -Referral to nutrition and diabetes [facility]) -Referral to ophthalmology -Urine microalbumin creatinine today -repeat A1c in 2 months

## 2022-03-17 LAB — MICROALBUMIN / CREATININE URINE RATIO
Creatinine, Urine: 218.5 mg/dL
Microalb/Creat Ratio: 10 mg/g creat (ref 0–29)
Microalbumin, Urine: 21.2 ug/mL

## 2022-03-17 NOTE — Progress Notes (Signed)
Internal Medicine Clinic Attending  I saw and evaluated the patient.  I personally confirmed the key portions of the history and exam documented by Dr. Zheng and I reviewed pertinent patient test results.  The assessment, diagnosis, and plan were formulated together and I agree with the documentation in the resident's note.  

## 2022-03-19 ENCOUNTER — Telehealth: Payer: Self-pay

## 2022-03-19 NOTE — Telephone Encounter (Signed)
Prior Authorization for patient (Dexcom G7 sensor/ Dexcom G7 Electrical engineer) came through on cover my meds unable to submit prior authorization due to error message.. here was an error with your request  ? Eligibility could not be verified for this patient - patient not found. Please review patient information and re-submit.

## 2022-03-20 ENCOUNTER — Other Ambulatory Visit: Payer: Medicare HMO

## 2022-03-20 ENCOUNTER — Inpatient Hospital Stay: Payer: Medicare HMO

## 2022-03-20 ENCOUNTER — Inpatient Hospital Stay: Payer: Medicare HMO | Attending: Internal Medicine | Admitting: Physician Assistant

## 2022-03-20 VITALS — BP 137/80 | HR 84 | Temp 97.8°F | Resp 17 | Ht 73.0 in | Wt 178.7 lb

## 2022-03-20 DIAGNOSIS — Z794 Long term (current) use of insulin: Secondary | ICD-10-CM | POA: Insufficient documentation

## 2022-03-20 DIAGNOSIS — C3491 Malignant neoplasm of unspecified part of right bronchus or lung: Secondary | ICD-10-CM

## 2022-03-20 DIAGNOSIS — I10 Essential (primary) hypertension: Secondary | ICD-10-CM | POA: Diagnosis not present

## 2022-03-20 DIAGNOSIS — Z79899 Other long term (current) drug therapy: Secondary | ICD-10-CM | POA: Diagnosis not present

## 2022-03-20 DIAGNOSIS — Z791 Long term (current) use of non-steroidal anti-inflammatories (NSAID): Secondary | ICD-10-CM | POA: Diagnosis not present

## 2022-03-20 DIAGNOSIS — R1310 Dysphagia, unspecified: Secondary | ICD-10-CM | POA: Diagnosis not present

## 2022-03-20 DIAGNOSIS — E119 Type 2 diabetes mellitus without complications: Secondary | ICD-10-CM | POA: Diagnosis not present

## 2022-03-20 DIAGNOSIS — C7931 Secondary malignant neoplasm of brain: Secondary | ICD-10-CM | POA: Diagnosis not present

## 2022-03-20 DIAGNOSIS — Z7984 Long term (current) use of oral hypoglycemic drugs: Secondary | ICD-10-CM | POA: Insufficient documentation

## 2022-03-20 DIAGNOSIS — C799 Secondary malignant neoplasm of unspecified site: Secondary | ICD-10-CM

## 2022-03-20 DIAGNOSIS — R634 Abnormal weight loss: Secondary | ICD-10-CM | POA: Insufficient documentation

## 2022-03-20 DIAGNOSIS — C3490 Malignant neoplasm of unspecified part of unspecified bronchus or lung: Secondary | ICD-10-CM | POA: Diagnosis not present

## 2022-03-20 DIAGNOSIS — Z5112 Encounter for antineoplastic immunotherapy: Secondary | ICD-10-CM | POA: Diagnosis not present

## 2022-03-20 DIAGNOSIS — Z87891 Personal history of nicotine dependence: Secondary | ICD-10-CM | POA: Insufficient documentation

## 2022-03-20 DIAGNOSIS — Z95828 Presence of other vascular implants and grafts: Secondary | ICD-10-CM

## 2022-03-20 LAB — CBC WITH DIFFERENTIAL (CANCER CENTER ONLY)
Abs Immature Granulocytes: 0.02 10*3/uL (ref 0.00–0.07)
Basophils Absolute: 0 10*3/uL (ref 0.0–0.1)
Basophils Relative: 1 %
Eosinophils Absolute: 0 10*3/uL (ref 0.0–0.5)
Eosinophils Relative: 1 %
HCT: 33.3 % — ABNORMAL LOW (ref 39.0–52.0)
Hemoglobin: 12.1 g/dL — ABNORMAL LOW (ref 13.0–17.0)
Immature Granulocytes: 0 %
Lymphocytes Relative: 23 %
Lymphs Abs: 1.5 10*3/uL (ref 0.7–4.0)
MCH: 30.9 pg (ref 26.0–34.0)
MCHC: 36.3 g/dL — ABNORMAL HIGH (ref 30.0–36.0)
MCV: 84.9 fL (ref 80.0–100.0)
Monocytes Absolute: 0.4 10*3/uL (ref 0.1–1.0)
Monocytes Relative: 7 %
Neutro Abs: 4.3 10*3/uL (ref 1.7–7.7)
Neutrophils Relative %: 68 %
Platelet Count: 324 10*3/uL (ref 150–400)
RBC: 3.92 MIL/uL — ABNORMAL LOW (ref 4.22–5.81)
RDW: 14.6 % (ref 11.5–15.5)
WBC Count: 6.2 10*3/uL (ref 4.0–10.5)
nRBC: 0 % (ref 0.0–0.2)

## 2022-03-20 LAB — CMP (CANCER CENTER ONLY)
ALT: 20 U/L (ref 0–44)
AST: 23 U/L (ref 15–41)
Albumin: 4.3 g/dL (ref 3.5–5.0)
Alkaline Phosphatase: 67 U/L (ref 38–126)
Anion gap: 9 (ref 5–15)
BUN: 12 mg/dL (ref 8–23)
CO2: 25 mmol/L (ref 22–32)
Calcium: 9.3 mg/dL (ref 8.9–10.3)
Chloride: 106 mmol/L (ref 98–111)
Creatinine: 0.72 mg/dL (ref 0.61–1.24)
GFR, Estimated: >60 mL/min (ref >60)
Glucose, Bld: 150 mg/dL — ABNORMAL HIGH (ref 70–99)
Potassium: 3.5 mmol/L (ref 3.5–5.1)
Sodium: 140 mmol/L (ref 135–145)
Total Bilirubin: 0.7 mg/dL (ref 0.3–1.2)
Total Protein: 7.6 g/dL (ref 6.5–8.1)

## 2022-03-20 LAB — TSH: TSH: 1.188 u[IU]/mL (ref 0.350–4.500)

## 2022-03-20 MED ORDER — HYDROCODONE-ACETAMINOPHEN 5-325 MG PO TABS: 1.0000 | ORAL_TABLET | Freq: Four times a day (QID) | ORAL | 0 refills | Status: DC | PRN

## 2022-03-20 MED ORDER — SODIUM CHLORIDE 0.9 % IV SOLN: Freq: Once | INTRAVENOUS | Status: AC

## 2022-03-20 MED ORDER — SODIUM CHLORIDE 0.9% FLUSH
10.0000 mL | Freq: Once | INTRAVENOUS | Status: AC
  Administered 2022-03-20: 10 mL

## 2022-03-20 MED ORDER — SODIUM CHLORIDE 0.9% FLUSH
10.0000 mL | INTRAVENOUS | Status: DC | PRN
  Administered 2022-03-20: 10 mL

## 2022-03-20 MED ORDER — HEPARIN SOD (PORK) LOCK FLUSH 100 UNIT/ML IV SOLN
500.0000 [IU] | Freq: Once | INTRAVENOUS | Status: AC | PRN
  Administered 2022-03-20: 500 [IU]

## 2022-03-20 MED ORDER — SODIUM CHLORIDE 0.9 % IV SOLN
350.0000 mg | Freq: Once | INTRAVENOUS | Status: AC
  Administered 2022-03-20: 350 mg via INTRAVENOUS
  Filled 2022-03-20: qty 7

## 2022-03-21 LAB — T4: T4, Total: 11.8 ug/dL (ref 4.5–12.0)

## 2022-03-22 ENCOUNTER — Other Ambulatory Visit: Payer: Self-pay

## 2022-03-27 ENCOUNTER — Telehealth: Payer: Self-pay | Admitting: Student

## 2022-03-27 NOTE — Telephone Encounter (Signed)
Done

## 2022-03-29 ENCOUNTER — Telehealth: Payer: Self-pay | Admitting: Internal Medicine

## 2022-03-29 NOTE — Telephone Encounter (Signed)
Rescheduled 11/28 appointment due to provider pal, patient has been called and notified.

## 2022-03-30 NOTE — Progress Notes (Signed)
Internal Medicine Clinic Attending  Case and documentation of Dr. Markus Jarvis reviewed.  I reviewed the AWV findings.  I agree with the assessment, diagnosis, and plan of care documented in the AWV note.

## 2022-04-02 ENCOUNTER — Telehealth: Payer: Self-pay | Admitting: Physician Assistant

## 2022-04-02 NOTE — Telephone Encounter (Signed)
Rescheduled 11/28 appointment time, patient has been called and voicemail was left.

## 2022-04-02 NOTE — Progress Notes (Signed)
[facility] OFFICE PROGRESS NOTE  [name], DO [address] [place] [address]  DIAGNOSIS: Stage IV (TX, N2, M1 C) non-small cell lung cancer, adenocarcinoma diagnosed in August 2021 and presented with solitary brain metastasis in addition to mediastinal lymphadenopathy.   PDL1 Expression 70%   Molecular Biomarkers:  Tumor Mutational Burden - 52 Muts/Mb Microsatellite status - MS-Stable Genomic Findings For a complete list of the genes assayed, please refer to the Appendix. NF1 E1694* MTAP loss exons 2-8 RICTOR amplification ATRX A419V BRAF K483E CDKN2A/B CDKN2A loss, CDKN2B loss DNMT3A E205* FGF10 amplification NTRK1 amplification - equivocal? 7 Disease relevant genes with no reportable alterations: ALK, EGFR, ERBB2, KRAS, MET, RET, ROS1   PRIOR THERAPY: 1) Status post right craniotomy with tumor resection followed by [facility] to solitary brain metastasis under the care of Dr. Lisbeth Renshaw and Dr. Sherral Hammers. 2) Concurrent chemoradiation with weekly carboplatin for AUC of 2 and paclitaxel 45 mg/M2.  First dose 02/01/2020. Status post 5 cycles.  Last dose was given February 29, 2020.  CURRENT THERAPY: First-line treatment with immunotherapy with Libtayo (Cempilimab) 350 mg IV every 3 weeks.  First dose April 25, 2020.  Status post 34 cycles.    INTERVAL HISTORY: Jared Shea. 66 y.o. male returns to the clinic today for a follow-up visit. When the patient was seen by myself on 02/27/22, the patient was feeling fairly well today that day but his blood sugar was significantly elevated, we were concerned that the patient developed diabetes.  He did not have any evidence of DKA.  He was able to establish with his primary care office and he is currently started on diabetic medications.  He was started on treatment with insulin and metformin. Overall, he is monitoring his BS at home and it has improved since starting on treatment.    Otherwise, the patient is tolerating his  treatment well.  He denies fever, chills, or night sweats. His appetite and weight is stable. He has some occasional shortness of breath if he flexes his neck forward. He states he no longer has a cough.  He denies chest discomfort or hemoptysis. Denies any nausea, vomiting, diarrhea, or constipation.  Denies any rashes or skin changes. He is followed closely by Dr. Mickeal Skinner for his history of metastatic disease to the brain.  He has a brain MRI scheduled for 12/1. He is here today for evaluation before starting cycle #35.    MEDICAL HISTORY: Past Medical History:  Diagnosis Date   Brain mass    Bursitis of right hip    Chest pain 08/12/2018   Chronic cough    Dependence on nicotine from cigarettes    Diabetes mellitus without complication ([place])    Essential hypertension 07/28/2018   nscl ca dx'd 12/2019   Seizures ([place])    Viral illness 03/23/2019    ALLERGIES:  is allergic to penicillins.  MEDICATIONS:  Current Outpatient Medications  Medication Sig Dispense Refill   Insulin Syringe-Needle U-100 (INSULIN SYRINGE .3CC/31GX5/16") 31G X 5/16" 0.3 ML MISC daily. use as directed     Lancets (ONETOUCH DELICA PLUS [id number]) MISC 3 (three) times daily. as directed     albuterol (VENTOLIN HFA) 108 (90 Base) MCG/ACT inhaler Inhale 2 puffs into the lungs every 6 (six) hours as needed for wheezing or shortness of breath. 8 g 2   blood glucose meter kit and supplies KIT Dispense based on patient and insurance preference. Use up to three times daily as directed. 1 each 0  celecoxib (CELEBREX) 200 MG capsule Take 1 capsule (200 mg total) by mouth 2 (two) times daily. 30 capsule 3   Continuous Blood Gluc Receiver ([place]) DEVI Please use as directed to check your blood sugar. 1 each 0   Continuous Blood Gluc Sensor (DEXCOM G7 SENSOR) MISC Please use as directed. Place on skin for glucose monitoring. Change sensors every 10 days. 3 each 11   glucose blood test strip Use as instructed. Please  check your blood sugar in the morning and up to 3 times during the day. 100 each 12   HYDROcodone-acetaminophen (NORCO) 5-325 MG tablet Take 1 tablet by mouth every 6 (six) hours as needed for moderate pain. 30 tablet 0   insulin detemir (LEVEMIR) 100 UNIT/ML injection Inject 0.1 mLs (10 Units total) into the skin daily. 10 mL 11   Insulin Syringes, Disposable, U-100 0.3 ML MISC 0.1 mLs by Does not apply route at bedtime. 100 each 9   levETIRAcetam (KEPPRA) 750 MG tablet Take 1 tablet (750 mg total) by mouth 2 (two) times daily. 180 tablet 4   metFORMIN (GLUCOPHAGE) 500 MG tablet Take 2 tablets (1,000 mg total) by mouth 2 (two) times daily with a meal. Start taking 1 tablet (500 mg) by mouth 2 (two) times daily for 1 week, increase by 500 mg so total 1500 mg for 1 week, then increase to 2 tablets (1000 mg) by mouth 2 (two) times daily 120 tablet 11   nortriptyline (PAMELOR) 25 MG capsule TAKE 2 CAPSULES(50 MG) BY MOUTH AT BEDTIME 60 capsule 5   omeprazole (PRILOSEC) 20 MG capsule Take 1 capsule (20 mg total) by mouth daily. Pt takes in the morning 90 capsule 1   sildenafil (VIAGRA) 50 MG tablet Take 2 tablets (100 mg total) by mouth daily as needed for erectile dysfunction. 30 tablet 3   No current facility-administered medications for this visit.   Facility-Administered Medications Ordered in Other Visits  Medication Dose Route Frequency Provider Last Rate Last Admin   cemiplimab-rwlc (LIBTAYO) 350 mg in sodium chloride 0.9 % 100 mL chemo infusion  350 mg Intravenous Once Curt Bears, MD       heparin lock flush 100 unit/mL  500 Units Intracatheter Once PRN Curt Bears, MD       sodium chloride flush (NS) 0.9 % injection 10 mL  10 mL Intracatheter PRN Curt Bears, MD        SURGICAL HISTORY:  Past Surgical History:  Procedure Laterality Date   APPLICATION OF CRANIAL NAVIGATION N/A 01/07/2020   Procedure: APPLICATION OF CRANIAL NAVIGATION;  Surgeon: Ashok Pall, MD;  Location:  [facility];  Service: Neurosurgery;  Laterality: N/A;   CRANIOTOMY Right 01/07/2020   Procedure: RIGHT CRANIOTOMY FOR TUMOR RESECTION;  Surgeon: Ashok Pall, MD;  Location: [facility];  Service: Neurosurgery;  Laterality: Right;  rm 21   ENDOBRONCHIAL ULTRASOUND N/A 12/21/2019   Procedure: ENDOBRONCHIAL ULTRASOUND;  Surgeon: Laurin Coder, MD;  Location: [facility];  Service: Pulmonary;  Laterality: N/A;   FINE NEEDLE ASPIRATION  12/21/2019   Procedure: FINE NEEDLE ASPIRATION (FNA) LINEAR;  Surgeon: Laurin Coder, MD;  Location: [facility];  Service: Pulmonary;;   IR IMAGING GUIDED PORT INSERTION  03/03/2020   NO PAST SURGERIES     VIDEO BRONCHOSCOPY N/A 12/21/2019   Procedure: VIDEO BRONCHOSCOPY WITHOUT FLUORO;  Surgeon: Laurin Coder, MD;  Location: [facility];  Service: Pulmonary;  Laterality: N/A;    REVIEW OF SYSTEMS:   Review of Systems  Constitutional: Negative for appetite change, chills, fatigue, fever and unexpected weight change.  HENT: Negative for mouth sores, nosebleeds, sore throat and trouble swallowing.   Eyes: Negative for eye problems and icterus.  Respiratory: Positive for intermittent shortness of breath (worse if flexes neck). Negative for cough, hemoptysis, and wheezing.   Cardiovascular: Negative for chest pain and leg swelling.  Gastrointestinal: Negative for abdominal pain, constipation, diarrhea, nausea and vomiting.  Genitourinary: Negative for bladder incontinence, difficulty urinating, dysuria, frequency and hematuria.   Musculoskeletal: Negative for back pain, gait problem, neck pain and neck stiffness.  Skin: Negative for itching and rash.  Neurological: Negative for dizziness, extremity weakness, gait problem, headaches, light-headedness and seizures.  Hematological: Negative for adenopathy. Does not bruise/bleed easily.  Psychiatric/Behavioral: Negative for confusion, depression and sleep disturbance. The patient is not nervous/anxious.       PHYSICAL EXAMINATION:  Blood pressure 136/81, pulse 90, temperature 98 F (36.7 C), temperature source Oral, resp. rate 17, weight 179 lb 9 oz (81.4 kg), SpO2 100 %.  ECOG PERFORMANCE STATUS: 1  Physical Exam  Constitutional: Oriented to person, place, and time and well-developed, well-nourished, and in no distress. No distress.  HENT:  Head: Normocephalic and atraumatic.  Mouth/Throat: Oropharynx is clear and moist. No oropharyngeal exudate.  Eyes: Conjunctivae are normal. Right eye exhibits no discharge. Left eye exhibits no discharge. No scleral icterus.  Neck: Normal range of motion. Neck supple.  Cardiovascular: Normal rate, regular rhythm, normal heart sounds and intact distal pulses.   Pulmonary/Chest: Effort normal. Quiet breath sounds in all lung fields. No respiratory distress. No wheezes. No rales.  Abdominal: Soft. Bowel sounds are normal. Exhibits no distension and no mass. There is no tenderness.  Musculoskeletal: Normal range of motion. Exhibits no edema.  Lymphadenopathy:    No cervical adenopathy.  Neurological: Alert and oriented to person, place, and time. Exhibits normal muscle tone. Gait normal. Coordination normal.  Skin: Skin is warm and dry. No rash noted. Not diaphoretic. No erythema. No pallor.  Psychiatric: Mood, memory and judgment normal.  Vitals reviewed.  LABORATORY DATA: Lab Results  Component Value Date   WBC 6.0 04/10/2022   HGB 12.3 (L) 04/10/2022   HCT 36.4 (L) 04/10/2022   MCV 88.8 04/10/2022   PLT 342 04/10/2022      Chemistry      Component Value Date/Time   NA 138 04/10/2022 1016   NA 140 11/23/2019 1516   K 3.8 04/10/2022 1016   CL 107 04/10/2022 1016   CO2 25 04/10/2022 1016   BUN 22 04/10/2022 1016   BUN 14 11/23/2019 1516   CREATININE 0.73 04/10/2022 1016      Component Value Date/Time   CALCIUM 9.6 04/10/2022 1016   ALKPHOS 67 04/10/2022 1016   AST 17 04/10/2022 1016   ALT 19 04/10/2022 1016   BILITOT 0.7 04/10/2022  1016       RADIOGRAPHIC STUDIES:  No results found.   ASSESSMENT/PLAN:  This is a very pleasant 66 year old African-American male diagnosed with stage IV (Tx, N2, M1c) non-small cell lung cancer, adenocarcinoma.  He presented with a solitary brain metastasis in addition to right hilar and mediastinal lymphadenopathy.  He was diagnosed in August 2021.     The patient is status post right craniotomy with resection of the solitary brain metastasis with SRS under the care of Dr. Lisbeth Renshaw and Dr. Christella Noa.    He completed weekly concurrent chemoradiation with carboplatin for an AUC of 2 and paclitaxel 45 mg per metered  square.  He is status post 5 cycles.  He tolerated it well except for some dysphagia/odynophagia.  The patient then had evidence of new brain metastases.  He is status post SRS to the new lesions on 04/22/2020 under the care of Dr. Lisbeth Renshaw.   The patient is currently undergoing immunotherapy with Libtayo IV every 3 weeks.  He is status post 34 cycles and tolerated it well except for arthralgias.  Previously offered a break from treatment due to his arthralgias.  The patient followed up with orthopedics who found that the patient has degenerative joint disease in his knees.  Since undergoing PT, he reports significant improvement in his pain. He has not needed to take any medications for arthralgias except he will take norco sparingly. I told him at his last appointment that would be his last refill of his norco.    Labs were reviewed.  Recommend that he proceed with cycle #35 today scheduled.   I have ordered a restaging CT scan of the chest, abdomen, and pelvis prior to his next appointment.  It is not scheduled. I gave the patient detailed instructions on how to schedule this.   We will see him back in 3 weeks for evaluation and to review his scan result and for a more detailed discussion about his current condition and recommended next steps in his care based on the scan result.    He will continue to follow up with his PCP for management of his diabetes.   The patient was advised to call immediately if he has any concerning symptoms in the interval. The patient voices understanding of current disease status and treatment options and is in agreement with the current care plan. All questions were answered. The patient knows to call the clinic with any problems, questions or concerns. We can certainly see the patient much sooner if necessary             No orders of the defined types were placed in this encounter.    The total time spent in the appointment was 20-29 minutes.   Shivank Pinedo L Allex Lapoint, PA-C 04/10/22

## 2022-04-10 ENCOUNTER — Other Ambulatory Visit: Payer: Self-pay

## 2022-04-10 ENCOUNTER — Encounter: Payer: Self-pay | Admitting: Physician Assistant

## 2022-04-10 ENCOUNTER — Inpatient Hospital Stay: Payer: Medicare HMO

## 2022-04-10 ENCOUNTER — Inpatient Hospital Stay: Payer: Medicare HMO | Admitting: Adult Health

## 2022-04-10 ENCOUNTER — Inpatient Hospital Stay: Payer: Medicare HMO | Admitting: Internal Medicine

## 2022-04-10 ENCOUNTER — Inpatient Hospital Stay (HOSPITAL_BASED_OUTPATIENT_CLINIC_OR_DEPARTMENT_OTHER): Payer: Medicare HMO | Admitting: Physician Assistant

## 2022-04-10 ENCOUNTER — Other Ambulatory Visit: Payer: Medicare HMO

## 2022-04-10 VITALS — BP 136/81 | HR 90 | Temp 98.0°F | Resp 17 | Wt 179.6 lb

## 2022-04-10 DIAGNOSIS — C3491 Malignant neoplasm of unspecified part of right bronchus or lung: Secondary | ICD-10-CM | POA: Diagnosis not present

## 2022-04-10 DIAGNOSIS — C799 Secondary malignant neoplasm of unspecified site: Secondary | ICD-10-CM | POA: Diagnosis not present

## 2022-04-10 DIAGNOSIS — Z95828 Presence of other vascular implants and grafts: Secondary | ICD-10-CM

## 2022-04-10 DIAGNOSIS — Z5112 Encounter for antineoplastic immunotherapy: Secondary | ICD-10-CM

## 2022-04-10 LAB — CBC WITH DIFFERENTIAL (CANCER CENTER ONLY)
Abs Immature Granulocytes: 0.03 10*3/uL (ref 0.00–0.07)
Basophils Absolute: 0 10*3/uL (ref 0.0–0.1)
Basophils Relative: 1 %
Eosinophils Absolute: 0.1 10*3/uL (ref 0.0–0.5)
Eosinophils Relative: 1 %
HCT: 36.4 % — ABNORMAL LOW (ref 39.0–52.0)
Hemoglobin: 12.3 g/dL — ABNORMAL LOW (ref 13.0–17.0)
Immature Granulocytes: 1 %
Lymphocytes Relative: 24 %
Lymphs Abs: 1.5 10*3/uL (ref 0.7–4.0)
MCH: 30 pg (ref 26.0–34.0)
MCHC: 33.8 g/dL (ref 30.0–36.0)
MCV: 88.8 fL (ref 80.0–100.0)
Monocytes Absolute: 0.5 10*3/uL (ref 0.1–1.0)
Monocytes Relative: 8 %
Neutro Abs: 4 10*3/uL (ref 1.7–7.7)
Neutrophils Relative %: 65 %
Platelet Count: 342 10*3/uL (ref 150–400)
RBC: 4.1 MIL/uL — ABNORMAL LOW (ref 4.22–5.81)
RDW: 15.8 % — ABNORMAL HIGH (ref 11.5–15.5)
WBC Count: 6 10*3/uL (ref 4.0–10.5)
nRBC: 0 % (ref 0.0–0.2)

## 2022-04-10 LAB — CMP (CANCER CENTER ONLY)
ALT: 19 U/L (ref 0–44)
AST: 17 U/L (ref 15–41)
Albumin: 4.7 g/dL (ref 3.5–5.0)
Alkaline Phosphatase: 67 U/L (ref 38–126)
Anion gap: 6 (ref 5–15)
BUN: 22 mg/dL (ref 8–23)
CO2: 25 mmol/L (ref 22–32)
Calcium: 9.6 mg/dL (ref 8.9–10.3)
Chloride: 107 mmol/L (ref 98–111)
Creatinine: 0.73 mg/dL (ref 0.61–1.24)
GFR, Estimated: >60 mL/min (ref >60)
Glucose, Bld: 136 mg/dL — ABNORMAL HIGH (ref 70–99)
Potassium: 3.8 mmol/L (ref 3.5–5.1)
Sodium: 138 mmol/L (ref 135–145)
Total Bilirubin: 0.7 mg/dL (ref 0.3–1.2)
Total Protein: 8.2 g/dL — ABNORMAL HIGH (ref 6.5–8.1)

## 2022-04-10 LAB — TSH: TSH: 1.065 u[IU]/mL (ref 0.350–4.500)

## 2022-04-10 MED ORDER — SODIUM CHLORIDE 0.9 % IV SOLN
350.0000 mg | Freq: Once | INTRAVENOUS | Status: AC
  Administered 2022-04-10: 350 mg via INTRAVENOUS
  Filled 2022-04-10: qty 7

## 2022-04-10 MED ORDER — SODIUM CHLORIDE 0.9 % IV SOLN: Freq: Once | INTRAVENOUS | Status: AC

## 2022-04-10 MED ORDER — SODIUM CHLORIDE 0.9% FLUSH
10.0000 mL | Freq: Once | INTRAVENOUS | Status: AC
  Administered 2022-04-10: 10 mL

## 2022-04-10 MED ORDER — HEPARIN SOD (PORK) LOCK FLUSH 100 UNIT/ML IV SOLN
500.0000 [IU] | Freq: Once | INTRAVENOUS | Status: AC | PRN
  Administered 2022-04-10: 500 [IU]

## 2022-04-10 MED ORDER — SODIUM CHLORIDE 0.9% FLUSH
10.0000 mL | INTRAVENOUS | Status: DC | PRN
  Administered 2022-04-10: 10 mL

## 2022-04-10 NOTE — Patient Instructions (Signed)
[facility]  Discharge Instructions: Thank you for choosing [facility] to provide your oncology and hematology care.   If you have a lab appointment with the [facility], please go directly to the [facility] and check in at the registration area.   Wear comfortable clothing and clothing appropriate for easy access to any Portacath or PICC line.   We strive to give you quality time with your provider. You may need to reschedule your appointment if you arrive late (15 or more minutes).  Arriving late affects you and other patients whose appointments are after yours.  Also, if you miss three or more appointments without notifying the office, you may be dismissed from the clinic at the provider's discretion.      For prescription refill requests, have your pharmacy contact our office and allow 72 hours for refills to be completed.    Today you received the following chemotherapy and/or immunotherapy agents: cemiplimab-rwlc      To help prevent nausea and vomiting after your treatment, we encourage you to take your nausea medication as directed.  BELOW ARE SYMPTOMS THAT SHOULD BE REPORTED IMMEDIATELY: *FEVER GREATER THAN 100.4 F (38 C) OR HIGHER *CHILLS OR SWEATING *NAUSEA AND VOMITING THAT IS NOT CONTROLLED WITH YOUR NAUSEA MEDICATION *UNUSUAL SHORTNESS OF BREATH *UNUSUAL BRUISING OR BLEEDING *URINARY PROBLEMS (pain or burning when urinating, or frequent urination) *BOWEL PROBLEMS (unusual diarrhea, constipation, pain near the anus) TENDERNESS IN MOUTH AND THROAT WITH OR WITHOUT PRESENCE OF ULCERS (sore throat, sores in mouth, or a toothache) UNUSUAL RASH, SWELLING OR PAIN  UNUSUAL VAGINAL DISCHARGE OR ITCHING   Items with * indicate a potential emergency and should be followed up as soon as possible or go to the Emergency Department if any problems should occur.  Please show the CHEMOTHERAPY ALERT CARD or IMMUNOTHERAPY ALERT CARD at  check-in to the Emergency Department and triage nurse.  Should you have questions after your visit or need to cancel or reschedule your appointment, please contact [facility]  Dept: [phone]  and follow the prompts.  Office hours are 8:00 a.m. to 4:30 p.m. Monday - Friday. Please note that voicemails left after 4:00 p.m. may not be returned until the following business day.  We are closed weekends and major holidays. You have access to a nurse at all times for urgent questions. Please call the main number to the clinic Dept: [phone] and follow the prompts.   For any non-urgent questions, you may also contact your provider using MyChart. We now offer e-Visits for anyone 62 and older to request care online for non-urgent symptoms. For details visit [email or web address].   Also download the MyChart app! Go to the app store, search "MyChart", open the app, select [facility], and log in with your MyChart username and password.  Masks are optional in the cancer centers. If you would like for your care team to wear a mask while they are taking care of you, please let them know. You may have one support person who is at least 68 years old accompany you for your appointments.

## 2022-04-11 ENCOUNTER — Telehealth: Payer: Self-pay | Admitting: *Deleted

## 2022-04-11 NOTE — Telephone Encounter (Signed)
RTC to patient.  Had called previously to see if he can stop the Insulin and go to pills for his Diabetes. No answer message left that the Clinics had returned his call.

## 2022-04-12 ENCOUNTER — Other Ambulatory Visit: Payer: Self-pay

## 2022-04-12 LAB — T4: T4, Total: 11.2 ug/dL (ref 4.5–12.0)

## 2022-04-13 ENCOUNTER — Other Ambulatory Visit: Payer: Self-pay

## 2022-04-13 ENCOUNTER — Other Ambulatory Visit: Payer: Medicare HMO

## 2022-04-13 ENCOUNTER — Encounter: Payer: Medicare HMO | Admitting: Internal Medicine

## 2022-04-13 NOTE — Progress Notes (Deleted)
CC: 1 month f/u ***  HPI:  [name] is a 66 y.o. male living with a history stated below and presents today for ***. Please see problem based assessment and plan for additional details.  Past Medical History:  Diagnosis Date   Brain mass    Bursitis of right hip    Chest pain 08/12/2018   Chronic cough    Dependence on nicotine from cigarettes    Diabetes mellitus without complication ([place])    Essential hypertension 07/28/2018   nscl ca dx'd 12/2019   Seizures ([place])    Viral illness 03/23/2019    Current Outpatient Medications on File Prior to Visit  Medication Sig Dispense Refill   albuterol (VENTOLIN HFA) 108 (90 Base) MCG/ACT inhaler Inhale 2 puffs into the lungs every 6 (six) hours as needed for wheezing or shortness of breath. 8 g 2   blood glucose meter kit and supplies KIT Dispense based on patient and insurance preference. Use up to three times daily as directed. 1 each 0   celecoxib (CELEBREX) 200 MG capsule Take 1 capsule (200 mg total) by mouth 2 (two) times daily. 30 capsule 3   Continuous Blood Gluc Receiver ([place]) DEVI Please use as directed to check your blood sugar. 1 each 0   Continuous Blood Gluc Sensor (DEXCOM G7 SENSOR) MISC Please use as directed. Place on skin for glucose monitoring. Change sensors every 10 days. 3 each 11   glucose blood test strip Use as instructed. Please check your blood sugar in the morning and up to 3 times during the day. 100 each 12   HYDROcodone-acetaminophen (NORCO) 5-325 MG tablet Take 1 tablet by mouth every 6 (six) hours as needed for moderate pain. 30 tablet 0   insulin detemir (LEVEMIR) 100 UNIT/ML injection Inject 0.1 mLs (10 Units total) into the skin daily. 10 mL 11   Insulin Syringe-Needle U-100 (INSULIN SYRINGE .3CC/31GX5/16") 31G X 5/16" 0.3 ML MISC daily. use as directed     Insulin Syringes, Disposable, U-100 0.3 ML MISC 0.1 mLs by Does not apply route at bedtime. 100 each 9   Lancets (ONETOUCH  DELICA PLUS [id number]) MISC 3 (three) times daily. as directed     levETIRAcetam (KEPPRA) 750 MG tablet Take 1 tablet (750 mg total) by mouth 2 (two) times daily. 180 tablet 4   metFORMIN (GLUCOPHAGE) 500 MG tablet Take 2 tablets (1,000 mg total) by mouth 2 (two) times daily with a meal. Start taking 1 tablet (500 mg) by mouth 2 (two) times daily for 1 week, increase by 500 mg so total 1500 mg for 1 week, then increase to 2 tablets (1000 mg) by mouth 2 (two) times daily 120 tablet 11   nortriptyline (PAMELOR) 25 MG capsule TAKE 2 CAPSULES(50 MG) BY MOUTH AT BEDTIME 60 capsule 5   omeprazole (PRILOSEC) 20 MG capsule Take 1 capsule (20 mg total) by mouth daily. Pt takes in the morning 90 capsule 1   sildenafil (VIAGRA) 50 MG tablet Take 2 tablets (100 mg total) by mouth daily as needed for erectile dysfunction. 30 tablet 3   No current facility-administered medications on file prior to visit.    Family History  Problem Relation Age of Onset   Heart disease Mother        CABG   Kidney disease Sister    Diabetes Brother    Kidney disease Brother        KIDNEY TRANSPLANT   Hypertension Sister  Healthy Daughter    Healthy Daughter    Healthy Son     Social History   Socioeconomic History   Marital status: Divorced    Spouse name: Not on file   Number of children: Not on file   Years of education: Not on file   Highest education level: Not on file  Occupational History   Not on file  Tobacco Use   Smoking status: Former    Packs/day: 1.00    Years: 48.00    Total pack years: 48.00    Types: Cigarettes    Start date: 08/07/1970    Quit date: 12/15/2019    Years since quitting: 2.3   Smokeless tobacco: Never  Vaping Use   Vaping Use: Never used  Substance and Sexual Activity   Alcohol use: Not Currently   Drug use: Never   Sexual activity: Not on file    Comment: YES  Other Topics Concern   Not on file  Social History Narrative   Not on file   Social Determinants of  Health   Financial Resource Strain: [place]  (03/16/2022)   Overall Financial Resource Strain (CARDIA)    Difficulty of Paying Living Expenses: Not hard at all  [place] Present (03/16/2022)   Hunger Vital Sign    Worried About [place] in the Last Year: Sometimes true    Ran Out of Food in the Last Year: Sometimes true  Transportation Needs: No Transportation Needs (03/16/2022)   PRAPARE - Hydrologist (Medical): No    Lack of Transportation (Non-Medical): No  Physical Activity: Insufficiently Active (03/16/2022)   Exercise Vital Sign    Days of Exercise per Week: 2 days    Minutes of Exercise per Session: 20 min  Stress: No Stress Concern Present (03/16/2022)   [place]    Feeling of Stress : Not at all  Social Connections: Moderately Isolated (03/16/2022)   Social Connection and Isolation Panel [NHANES]    Frequency of Communication with Friends and Family: More than three times a week    Frequency of Social Gatherings with Friends and Family: Twice a week    Attends Religious Services: 1 to 4 times per year    Active Member of [employer] or Organizations: No    Attends Archivist Meetings: Never    Marital Status: Separated  Intimate Partner Violence: Not At Risk (03/16/2022)   Humiliation, Afraid, Rape, and Kick questionnaire    Fear of Current or Ex-Partner: No    Emotionally Abused: No    Physically Abused: No    Sexually Abused: No    Review of Systems: ROS negative except for what is noted on the assessment and plan.  There were no vitals filed for this visit.  Physical Exam: Constitutional: well-appearing *** sitting in ***, in no acute distress HENT: normocephalic atraumatic, mucous membranes moist Eyes: conjunctiva non-erythematous Cardiovascular: regular rate and rhythm, no m/r/g Pulmonary/Chest: normal work of breathing on room  air, lungs clear to auscultation bilaterally Abdominal: soft, non-tender, non-distended MSK: normal bulk and tone Neurological: alert & oriented x 3, no focal deficit Skin: warm and dry Psych: normal mood and behavior  Assessment & Plan:   DM: levemir 10u, metformin ***,   Patient {GC/GE:[id number]::"discussed with","seen with"} Dr. {[id number]::"TFTDDUKG","U. Hoffman","Mullen","Narendra","Vincent","Guilloud","Lau","Machen"}  No problem-specific Assessment & Plan notes found for this encounter.   Buddy Duty, D.O. [facility], PGY-2  Phone: [phone] Date 04/13/2022 Time 8:10 AM

## 2022-04-16 ENCOUNTER — Other Ambulatory Visit: Payer: Self-pay

## 2022-04-16 ENCOUNTER — Inpatient Hospital Stay: Payer: Medicare HMO

## 2022-04-16 ENCOUNTER — Inpatient Hospital Stay: Payer: Medicare HMO | Admitting: Internal Medicine

## 2022-04-21 ENCOUNTER — Other Ambulatory Visit: Payer: Self-pay | Admitting: Physician Assistant

## 2022-04-24 ENCOUNTER — Other Ambulatory Visit: Payer: Self-pay | Admitting: Internal Medicine

## 2022-04-24 DIAGNOSIS — I10 Essential (primary) hypertension: Secondary | ICD-10-CM

## 2022-04-26 ENCOUNTER — Ambulatory Visit
Admission: RE | Admit: 2022-04-26 | Discharge: 2022-04-26 | Disposition: A | Discharge status: Home / Self Care | Payer: Medicare HMO | Source: Ambulatory Visit | Attending: Internal Medicine | Admitting: Internal Medicine

## 2022-04-26 ENCOUNTER — Telehealth: Payer: Self-pay | Admitting: Physician Assistant

## 2022-04-26 ENCOUNTER — Other Ambulatory Visit: Payer: Self-pay | Admitting: *Deleted

## 2022-04-26 DIAGNOSIS — Z95828 Presence of other vascular implants and grafts: Secondary | ICD-10-CM

## 2022-04-26 DIAGNOSIS — C7931 Secondary malignant neoplasm of brain: Secondary | ICD-10-CM

## 2022-04-26 MED ORDER — GADOPICLENOL 0.5 MMOL/ML IV SOLN
9.0000 mL | Freq: Once | INTRAVENOUS | Status: AC | PRN
  Administered 2022-04-26: 9 mL via INTRAVENOUS

## 2022-04-26 MED ORDER — SODIUM CHLORIDE 0.9% FLUSH
10.0000 mL | INTRAVENOUS | Status: DC | PRN
  Administered 2022-04-26: 10 mL via INTRAVENOUS

## 2022-04-26 MED ORDER — HEPARIN SOD (PORK) LOCK FLUSH 100 UNIT/ML IV SOLN
500.0000 [IU] | Freq: Once | INTRAVENOUS | Status: AC
  Administered 2022-04-26: 500 [IU] via INTRAVENOUS

## 2022-04-26 NOTE — Telephone Encounter (Signed)
Rescheduled 12/21 appointment due to providers request, called and left a voicemail.

## 2022-04-27 ENCOUNTER — Other Ambulatory Visit: Payer: Self-pay

## 2022-04-29 ENCOUNTER — Other Ambulatory Visit: Payer: Self-pay

## 2022-04-30 ENCOUNTER — Inpatient Hospital Stay: Payer: Medicare HMO | Attending: Internal Medicine

## 2022-04-30 ENCOUNTER — Other Ambulatory Visit: Payer: Self-pay | Admitting: Student

## 2022-04-30 DIAGNOSIS — I10 Essential (primary) hypertension: Secondary | ICD-10-CM | POA: Insufficient documentation

## 2022-04-30 DIAGNOSIS — C7931 Secondary malignant neoplasm of brain: Secondary | ICD-10-CM | POA: Insufficient documentation

## 2022-04-30 DIAGNOSIS — E119 Type 2 diabetes mellitus without complications: Secondary | ICD-10-CM | POA: Insufficient documentation

## 2022-04-30 DIAGNOSIS — Z87891 Personal history of nicotine dependence: Secondary | ICD-10-CM | POA: Insufficient documentation

## 2022-04-30 DIAGNOSIS — C3490 Malignant neoplasm of unspecified part of unspecified bronchus or lung: Secondary | ICD-10-CM | POA: Insufficient documentation

## 2022-05-03 ENCOUNTER — Inpatient Hospital Stay (HOSPITAL_BASED_OUTPATIENT_CLINIC_OR_DEPARTMENT_OTHER): Payer: Medicare HMO | Admitting: Internal Medicine

## 2022-05-03 ENCOUNTER — Ambulatory Visit: Payer: Medicare HMO | Admitting: Physician Assistant

## 2022-05-03 ENCOUNTER — Other Ambulatory Visit: Payer: Self-pay

## 2022-05-03 ENCOUNTER — Other Ambulatory Visit: Payer: Medicare HMO

## 2022-05-03 VITALS — BP 138/73 | HR 82 | Temp 98.3°F | Resp 18 | Ht 73.0 in | Wt 185.0 lb

## 2022-05-03 DIAGNOSIS — G40209 Localization-related (focal) (partial) symptomatic epilepsy and epileptic syndromes with complex partial seizures, not intractable, without status epilepticus: Secondary | ICD-10-CM | POA: Diagnosis not present

## 2022-05-03 DIAGNOSIS — Z87891 Personal history of nicotine dependence: Secondary | ICD-10-CM | POA: Diagnosis not present

## 2022-05-03 DIAGNOSIS — I10 Essential (primary) hypertension: Secondary | ICD-10-CM | POA: Diagnosis not present

## 2022-05-03 DIAGNOSIS — C3490 Malignant neoplasm of unspecified part of unspecified bronchus or lung: Secondary | ICD-10-CM | POA: Diagnosis present

## 2022-05-03 DIAGNOSIS — C7931 Secondary malignant neoplasm of brain: Secondary | ICD-10-CM

## 2022-05-03 DIAGNOSIS — R569 Unspecified convulsions: Secondary | ICD-10-CM

## 2022-05-03 DIAGNOSIS — E119 Type 2 diabetes mellitus without complications: Secondary | ICD-10-CM | POA: Diagnosis not present

## 2022-05-03 MED ORDER — LEVETIRACETAM 750 MG PO TABS: 750.0000 mg | ORAL_TABLET | Freq: Two times a day (BID) | ORAL | 4 refills | Status: DC

## 2022-05-03 NOTE — Progress Notes (Signed)
[place] [facility] [place], [place] [address] [phone]  Interval Evaluation  Date of Service: 05/03/22 Patient Name: Nesbit Michon. Patient MRN: [id number] Patient DOB: 1956/03/02 Provider: Ventura Sellers, MD  Identifying Statement:  Jakie Debow. is a 66 y.o. male with Malignant neoplasm metastatic to brain [facility]) [C79.31]   Primary Cancer:  Oncologic History: Oncology History  Non-small cell carcinoma of lung, stage 4 w/ mets to brain ([place])  12/31/2019 Initial Diagnosis   Non-small cell carcinoma of lung, stage 4 w/ mets to brain ([place])   06/06/2020 Cancer Staging   Staging form: Lung, AJCC 8th Edition - Clinical: Stage IVB (cT0, cN2, cM1c) - Signed by Curt Bears, MD on 06/06/2020   Adenocarcinoma, metastatic ([place])  01/07/2020 Initial Diagnosis   Adenocarcinoma, metastatic ([place])   02/01/2020 - 03/07/2020 Chemotherapy   The patient had dexamethasone (DECADRON) 4 MG tablet, 8 mg, Oral, Daily, 1 of 1 cycle, Start date: --, End date: -- palonosetron (ALOXI) injection 0.25 mg, 0.25 mg, Intravenous,  Once, 5 of 7 cycles Administration: 0.25 mg (02/01/2020), 0.25 mg (02/08/2020), 0.25 mg (03/07/2020), 0.25 mg (02/15/2020), 0.25 mg (02/22/2020) CARBOplatin (PARAPLATIN) 220 mg in sodium chloride 0.9 % 250 mL chemo infusion, 220 mg (100 % of original dose 216.8 mg), Intravenous,  Once, 5 of 7 cycles Dose modification: 216.8 mg (original dose 216.8 mg, Cycle 1) Administration: 220 mg (02/01/2020), 220 mg (02/08/2020), 220 mg (03/07/2020), 220 mg (02/15/2020), 220 mg (02/22/2020) PACLitaxel (TAXOL) 90 mg in sodium chloride 0.9 % 250 mL chemo infusion (</= 39m/m2), 45 mg/m2 = 90 mg, Intravenous,  Once, 5 of 7 cycles Administration: 90 mg (02/01/2020), 90 mg (02/08/2020), 90 mg (03/07/2020), 90 mg (02/15/2020), 90 mg (02/22/2020)  for chemotherapy treatment.    04/25/2020 -  Chemotherapy   Patient is on Treatment Plan : LUNG NSCLC  Cemiplimab q21d      CNS Oncologic History 01/05/20: Pre-op SRS (Lisbeth Renshaw 01/07/20: Craniotomy, R frontal resection ([place]) 04/22/20: Salvage SRS to 3 targets   Interval History:  ELamere Lightner presents today for follow up after brain MRI.  Denies clinical changes since prior visit, no further seizures.  No changse in his gait. Currently undergoing Libtayo treatments with Dr. MJulien Nordmannsince 04/29/20.  Would like to return to work.  H+P (04/18/20) Patient presents following recent brain MRI.  He had completed pre-op SRS and underwent craniotomy for R frontal metastasis, after initial presenting with focal seizures (facial twitching).  He denies focal neurologic complaints today, he is functional and independent.  He is undergoing chemotherapy with Dr. MJulien Nordmann(completed [place]) without complication.  His gait is unassisted.  Denies any seizures following his initial presentation, compliant with Keppra.      Medications: Current Outpatient Medications on File Prior to Visit  Medication Sig Dispense Refill   albuterol (VENTOLIN HFA) 108 (90 Base) MCG/ACT inhaler Inhale 2 puffs into the lungs every 6 (six) hours as needed for wheezing or shortness of breath. 8 g 2   blood glucose meter kit and supplies KIT Dispense based on patient and insurance preference. Use up to three times daily as directed. 1 each 0   celecoxib (CELEBREX) 200 MG capsule Take 1 capsule (200 mg total) by mouth 2 (two) times daily. 30 capsule 3   Continuous Blood Gluc Receiver ([place] DEVI Please use as directed to check your blood sugar. 1 each 0   Continuous Blood Gluc Sensor (DMaurice  MISC Please use as directed. Place on skin for glucose monitoring. Change sensors every 10 days. 3 each 11   glucose blood (ONETOUCH VERIO) test strip TEST 3 TIMES DAILY 100 strip 11   HYDROcodone-acetaminophen (NORCO) 5-325 MG tablet Take 1 tablet by mouth every 6 (six) hours as needed for moderate pain. 30  tablet 0   insulin detemir (LEVEMIR) 100 UNIT/ML injection Inject 0.1 mLs (10 Units total) into the skin daily. 10 mL 11   Insulin Syringe-Needle U-100 (INSULIN SYRINGE .3CC/31GX5/16") 31G X 5/16" 0.3 ML MISC daily. use as directed     Insulin Syringes, Disposable, U-100 0.3 ML MISC 0.1 mLs by Does not apply route at bedtime. 100 each 9   Lancets (ONETOUCH DELICA PLUS CBULAG53M) MISC 3 (three) times daily. as directed     levETIRAcetam (KEPPRA) 750 MG tablet Take 1 tablet (750 mg total) by mouth 2 (two) times daily. 180 tablet 4   losartan (COZAAR) 25 MG tablet TAKE 1 TABLET (25 MG TOTAL) DAILY. 90 tablet 3   metFORMIN (GLUCOPHAGE) 500 MG tablet Take 2 tablets (1,000 mg total) by mouth 2 (two) times daily with a meal. Start taking 1 tablet (500 mg) by mouth 2 (two) times daily for 1 week, increase by 500 mg so total 1500 mg for 1 week, then increase to 2 tablets (1000 mg) by mouth 2 (two) times daily 120 tablet 11   nortriptyline (PAMELOR) 25 MG capsule TAKE 2 CAPSULES(50 MG) BY MOUTH AT BEDTIME 60 capsule 5   omeprazole (PRILOSEC) 20 MG capsule Take 1 capsule (20 mg total) by mouth daily. Pt takes in the morning 90 capsule 1   sildenafil (VIAGRA) 50 MG tablet Take 2 tablets (100 mg total) by mouth daily as needed for erectile dysfunction. 30 tablet 3   No current facility-administered medications on file prior to visit.    Allergies:  Allergies  Allergen Reactions   Penicillins Rash    Did it involve swelling of the face/tongue/throat, SOB, or low BP? N Did it involve sudden or severe rash/hives, skin peeling, or any reaction on the inside of your mouth or nose? Y Did you need to seek medical attention at a hospital or doctor's office? Y When did it last happen?    childhood   If all above answers are "NO", may proceed with cephalosporin use.    Past Medical History:  Past Medical History:  Diagnosis Date   Brain mass    Bursitis of right hip    Chest pain 08/12/2018   Chronic cough     Dependence on nicotine from cigarettes    Diabetes mellitus without complication ([place])    Essential hypertension 07/28/2018   nscl ca dx'd 12/2019   Seizures ([place])    Viral illness 03/23/2019   Past Surgical History:  Past Surgical History:  Procedure Laterality Date   APPLICATION OF CRANIAL NAVIGATION N/A 01/07/2020   Procedure: APPLICATION OF CRANIAL NAVIGATION;  Surgeon: Ashok Pall, MD;  Location: [facility];  Service: Neurosurgery;  Laterality: N/A;   CRANIOTOMY Right 01/07/2020   Procedure: RIGHT CRANIOTOMY FOR TUMOR RESECTION;  Surgeon: Ashok Pall, MD;  Location: [facility];  Service: Neurosurgery;  Laterality: Right;  rm 21   ENDOBRONCHIAL ULTRASOUND N/A 12/21/2019   Procedure: ENDOBRONCHIAL ULTRASOUND;  Surgeon: Laurin Coder, MD;  Location: [facility];  Service: Pulmonary;  Laterality: N/A;   FINE NEEDLE ASPIRATION  12/21/2019   Procedure: FINE NEEDLE ASPIRATION (FNA) LINEAR;  Surgeon: Laurin Coder, MD;  [facility];  Service: Pulmonary;;   IR IMAGING GUIDED PORT INSERTION  03/03/2020   NO PAST SURGERIES     VIDEO BRONCHOSCOPY N/A 12/21/2019   Procedure: VIDEO BRONCHOSCOPY WITHOUT FLUORO;  Surgeon: Laurin Coder, MD;  Location: [facility];  Service: Pulmonary;  Laterality: N/A;   Social History:  Social History   Socioeconomic History   Marital status: Divorced    Spouse name: Not on file   Number of children: Not on file   Years of education: Not on file   Highest education level: Not on file  Occupational History   Not on file  Tobacco Use   Smoking status: Former    Packs/day: 1.00    Years: 48.00    Total pack years: 48.00    Types: Cigarettes    Start date: 08/07/1970    Quit date: 12/15/2019    Years since quitting: 2.3   Smokeless tobacco: Never  Vaping Use   Vaping Use: Never used  Substance and Sexual Activity   Alcohol use: Not Currently   Drug use: Never   Sexual activity: Not on file    Comment: YES  Other Topics Concern   Not  on file  Social History Narrative   Not on file   Social Determinants of Health   Financial Resource Strain: Elim  (03/16/2022)   Overall Financial Resource Strain (CARDIA)    Difficulty of Paying Living Expenses: Not hard at all  Food Insecurity: Food Insecurity Present (03/16/2022)   Hunger Vital Sign    Worried About [place] in the Last Year: Sometimes true    Ran Out of Food in the Last Year: Sometimes true  Transportation Needs: No Transportation Needs (03/16/2022)   PRAPARE - Hydrologist (Medical): No    Lack of Transportation (Non-Medical): No  Physical Activity: Insufficiently Active (03/16/2022)   Exercise Vital Sign    Days of Exercise per Week: 2 days    Minutes of Exercise per Session: 20 min  Stress: No Stress Concern Present (03/16/2022)   [place]    Feeling of Stress : Not at all  Social Connections: Moderately Isolated (03/16/2022)   Social Connection and Isolation Panel [NHANES]    Frequency of Communication with Friends and Family: More than three times a week    Frequency of Social Gatherings with Friends and Family: Twice a week    Attends Religious Services: 1 to 4 times per year    Active Member of [employer] or Organizations: No    Attends Archivist Meetings: Never    Marital Status: Separated  Intimate Partner Violence: Not At Risk (03/16/2022)   Humiliation, Afraid, Rape, and Kick questionnaire    Fear of Current or Ex-Partner: No    Emotionally Abused: No    Physically Abused: No    Sexually Abused: No   Family History:  Family History  Problem Relation Age of Onset   Heart disease Mother        CABG   Kidney disease Sister    Diabetes Brother    Kidney disease Brother        KIDNEY TRANSPLANT   Hypertension Sister    Healthy Daughter    Healthy Daughter    Healthy Son     Review of Systems: Constitutional: Doesn't report  fevers, chills or abnormal weight loss Eyes: Doesn't report blurriness of vision Ears, nose, mouth, throat, and face: Doesn't  report sore throat Respiratory: Doesn't report cough, dyspnea or wheezes Cardiovascular: Doesn't report palpitation, chest discomfort  Gastrointestinal:  Doesn't report nausea, constipation, diarrhea GU: Doesn't report incontinence Skin: Doesn't report skin rashes Neurological: Per HPI Musculoskeletal: diffuse joint pain Behavioral/Psych: Doesn't report anxiety  Physical Exam: Wt Readings from Last 3 Encounters:  05/03/22 185 lb (83.9 kg)  04/10/22 179 lb 9 oz (81.4 kg)  03/20/22 178 lb 11.2 oz (81.1 kg)   Temp Readings from Last 3 Encounters:  05/03/22 98.3 F (36.8 C) (Temporal)  04/10/22 98 F (36.7 C) (Oral)  03/20/22 97.8 F (36.6 C) (Oral)   BP Readings from Last 3 Encounters:  05/03/22 138/73  04/10/22 136/81  03/20/22 137/80   Pulse Readings from Last 3 Encounters:  05/03/22 82  04/10/22 90  03/20/22 84    KPS: 80. General: Alert, cooperative, pleasant, in no acute distress Head: Normal EENT: No conjunctival injection or scleral icterus.  Lungs: Resp effort normal Cardiac: Regular rate Abdomen: Non-distended abdomen Skin: No rashes cyanosis or petechiae. Extremities: No clubbing or edema  Neurologic Exam: Mental Status: Awake, alert, attentive to examiner. Oriented to self and environment. Language is fluent with intact comprehension.  Cranial Nerves: Visual acuity is grossly normal. Visual fields are full. Extra-ocular movements intact. No ptosis. Face is symmetric Motor: Tone and bulk are normal. Power is full in both arms and legs. Reflexes are symmetric, no pathologic reflexes present.  Sensory: Intact to light touch Gait: Normal.   Labs: I have reviewed the data as listed    Component Value Date/Time   NA 138 04/10/2022 1016   NA 140 11/23/2019 1516   K 3.8 04/10/2022 1016   CL 107 04/10/2022 1016   CO2 25 04/10/2022  1016   GLUCOSE 136 (H) 04/10/2022 1016   BUN 22 04/10/2022 1016   BUN 14 11/23/2019 1516   CREATININE 0.73 04/10/2022 1016   CALCIUM 9.6 04/10/2022 1016   PROT 8.2 (H) 04/10/2022 1016   ALBUMIN 4.7 04/10/2022 1016   AST 17 04/10/2022 1016   ALT 19 04/10/2022 1016   ALKPHOS 67 04/10/2022 1016   BILITOT 0.7 04/10/2022 1016   GFRNONAA >60 04/10/2022 1016   GFRAA >60 02/15/2020 1000   Lab Results  Component Value Date   WBC 6.0 04/10/2022   NEUTROABS 4.0 04/10/2022   HGB 12.3 (L) 04/10/2022   HCT 36.4 (L) 04/10/2022   MCV 88.8 04/10/2022   PLT 342 04/10/2022    Imaging:  [place] Clinician Interpretation: I have personally reviewed the CNS images as listed.  My interpretation, in the context of the patient's clinical presentation, is stable disease  MR BRAIN W WO CONTRAST  Result Date: 04/27/2022 CLINICAL DATA:  Brain/CNS neoplasm. Assess treatment response. Follow-up brain metastases. Metastatic non-small cell lung cancer EXAM: MRI HEAD WITHOUT AND WITH CONTRAST TECHNIQUE: Multiplanar, multiecho pulse sequences of the brain and surrounding structures were obtained without and with intravenous contrast. CONTRAST:  9 ccVueway COMPARISON:  10/13/2021 FINDINGS: Brain: Diffusion imaging does not show any acute or subacute infarction or other cause of restricted diffusion. No abnormality affects the brainstem or cerebellum. There is been previous right frontoparietal craniotomy. Changes of prior surgery at the right posterior frontal region with some focal gliosis and volume loss. Stable appearance. No abnormal enhancement. No sign of residual or recurrent tumor. Otherwise, there are scattered old small vessel infarctions of the cerebral hemispheric white matter. No other abnormality seen on this examination. Vascular: Major vessels at the base of the brain show  flow. Skull and upper cervical spine: Negative otherwise Sinuses/Orbits: Clear/normal Other: None IMPRESSION: 1. Stable examination. No  evidence of residual or recurrent tumor. Previous right frontoparietal craniotomy. Focal gliosis and volume loss in the right posterior frontal region. 2. Scattered old small-vessel ischemic changes of the cerebral hemispheric white matter. Electronically Signed   By: Nelson Chimes M.D.   On: 04/27/2022 18:55     Assessment/Plan Malignant neoplasm metastatic to brain [facility]) [C79.31]  [name]. is clinically and radiographically stable today. No new or progressive changes.  May con't Keppra for seizure prevention.  Continues on Libtayo immunotherapy with Dr. Julien Nordmann.  With few headaches, he may try stopping the Pamelor at night, if tolerated.  We appreciate the opportunity to participate in the care of Leonidas Boateng..    We ask that [name]. return to clinic in 6 months following next brain MRI, or sooner as needed.  All questions were answered. The patient knows to call the clinic with any problems, questions or concerns. No barriers to learning were detected.  The total time spent in the encounter was 30 minutes and more than 50% was on counseling and review of test results   Ventura Sellers, MD Medical Director of Neuro-Oncology [facility] at [place] 05/03/22 3:07 PM

## 2022-05-04 ENCOUNTER — Telehealth: Payer: Self-pay | Admitting: Internal Medicine

## 2022-05-04 NOTE — Telephone Encounter (Signed)
Called patient to schedule f/u. Left voicemail with appointment details.

## 2022-05-06 ENCOUNTER — Other Ambulatory Visit: Payer: Self-pay

## 2022-05-11 ENCOUNTER — Ambulatory Visit
Admission: RE | Admit: 2022-05-11 | Discharge: 2022-05-11 | Disposition: A | Discharge status: Home / Self Care | Payer: Medicare HMO | Source: Ambulatory Visit | Attending: Physician Assistant | Admitting: Physician Assistant

## 2022-05-11 ENCOUNTER — Other Ambulatory Visit: Payer: Medicare HMO

## 2022-05-11 DIAGNOSIS — C799 Secondary malignant neoplasm of unspecified site: Secondary | ICD-10-CM

## 2022-05-15 ENCOUNTER — Encounter: Payer: Self-pay | Admitting: Physician Assistant

## 2022-05-15 ENCOUNTER — Other Ambulatory Visit: Payer: Self-pay

## 2022-05-15 DIAGNOSIS — Z95828 Presence of other vascular implants and grafts: Secondary | ICD-10-CM

## 2022-05-15 IMAGING — US US THYROID
1 series · 13 of 25 positions shown · non-contrast
Comparison: None.

CLINICAL DATA: Incidental on PET. Hypermetabolic nodule identified
in the right thyroid gland on prior PET-CT. Patient has a known
history of non-small cell carcinoma of the lung.

EXAM:
THYROID ULTRASOUND
TECHNIQUE: Ultrasound examination of the thyroid gland and adjacent soft
tissues was performed.

[Series 1: us thyroid · 13 of 48 slices shown]
[im 1/48]
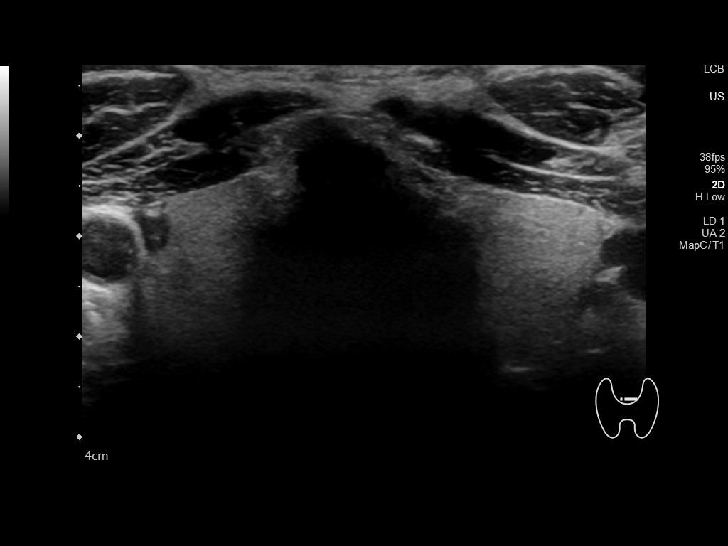
[im 4/48]
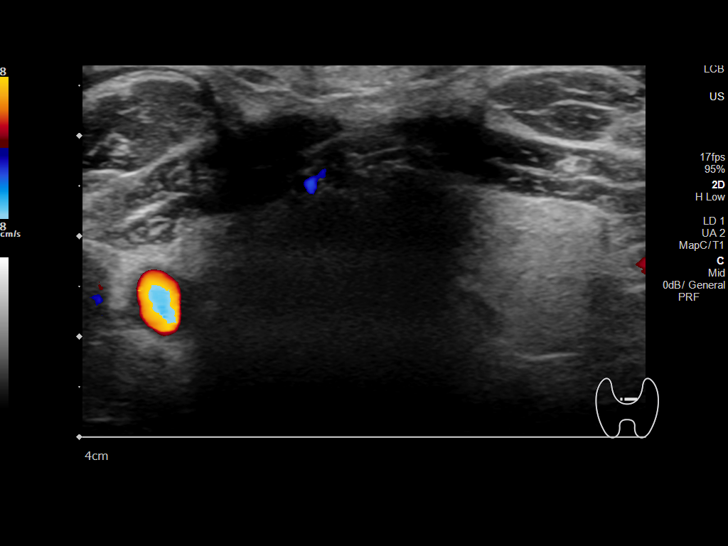
[im 8/48]
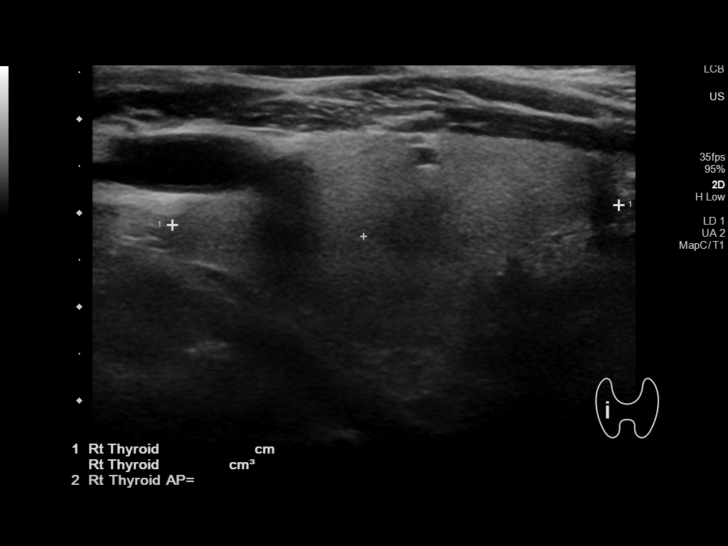
[im 12/48]
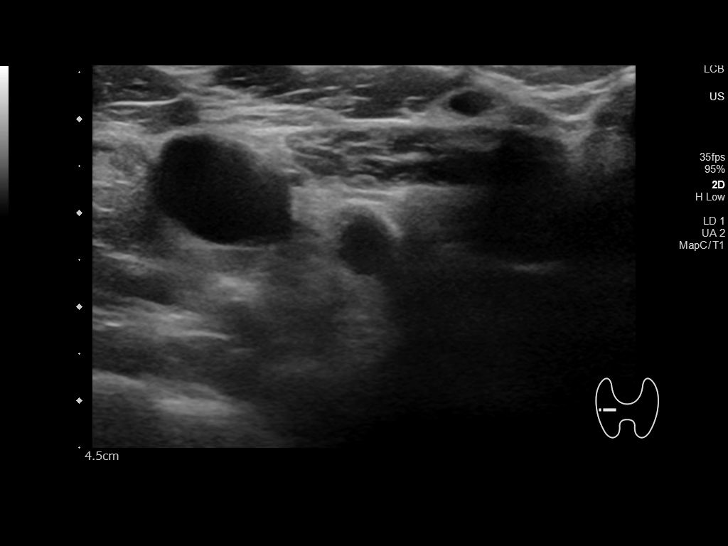
[im 16/48]
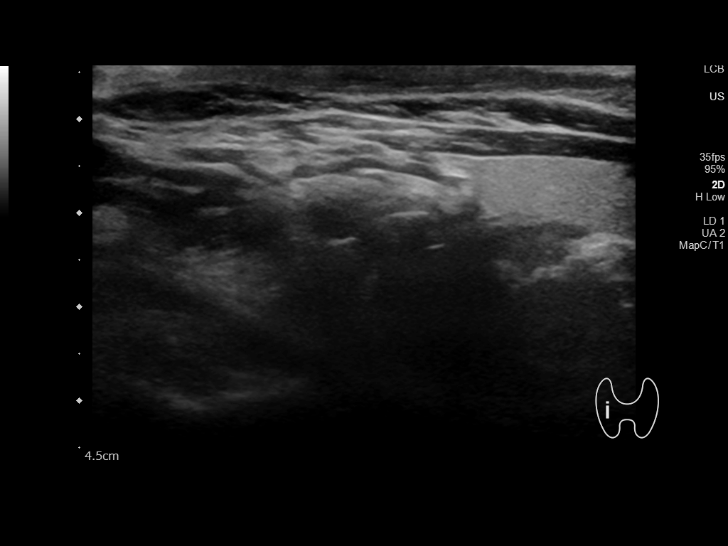
[im 20/48]
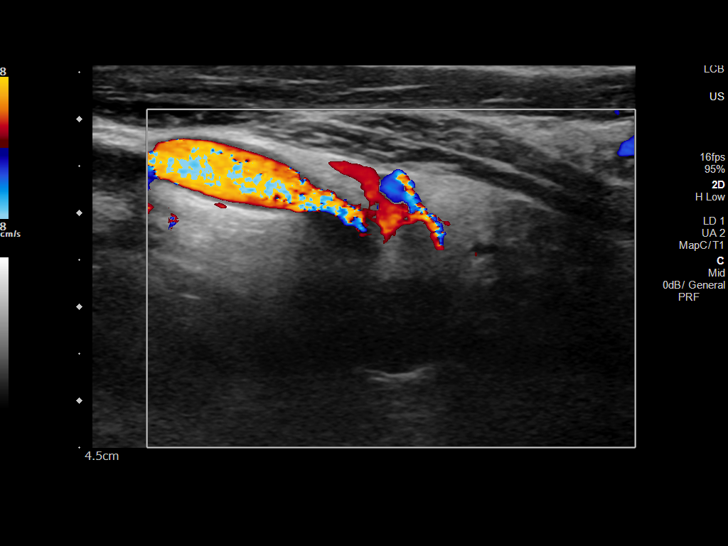
[im 24/48]
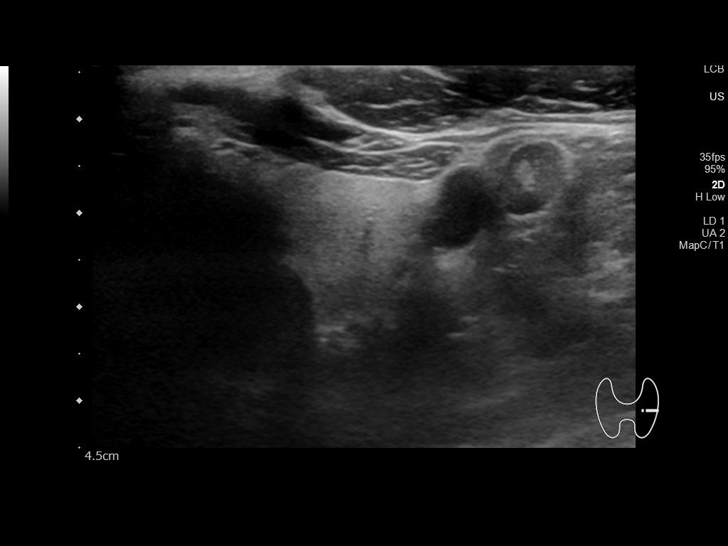
[im 28/48]
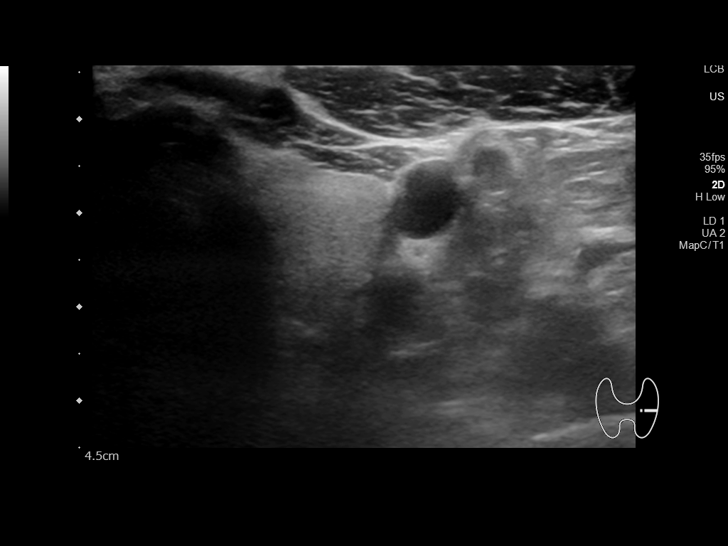
[im 32/48]
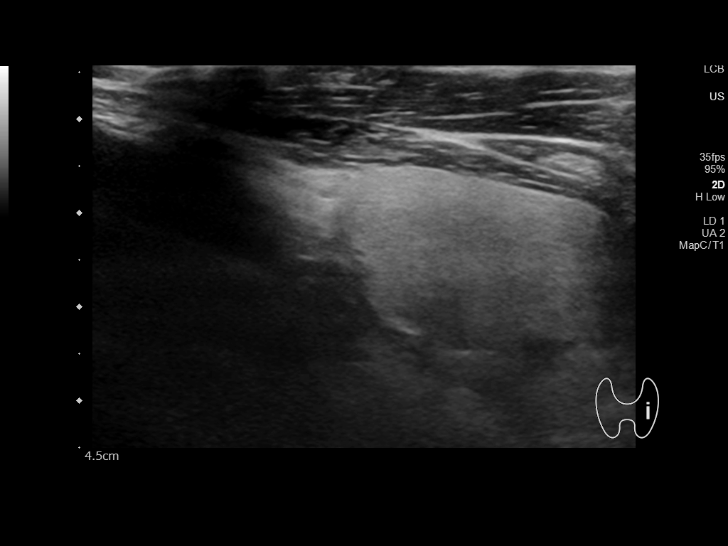
[im 36/48]
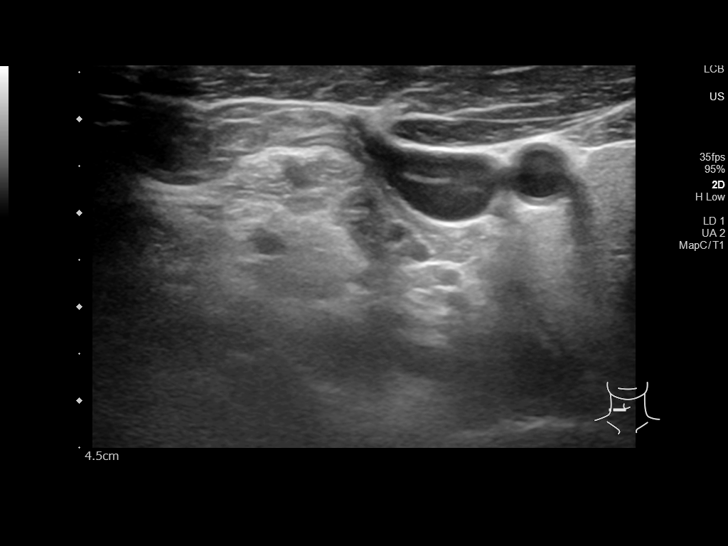
[im 40/48]
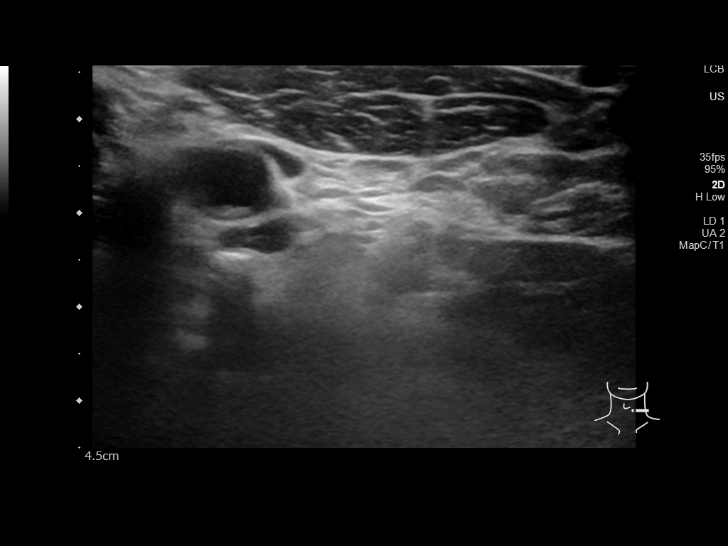
[im 44/48]
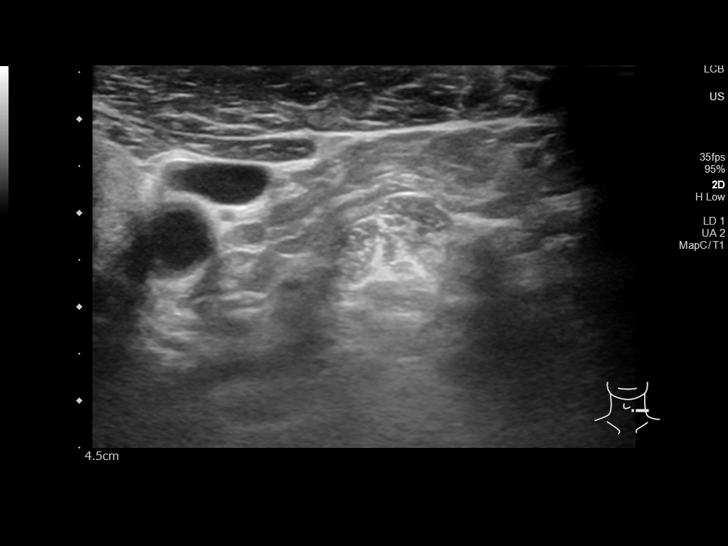
[im 48/48]
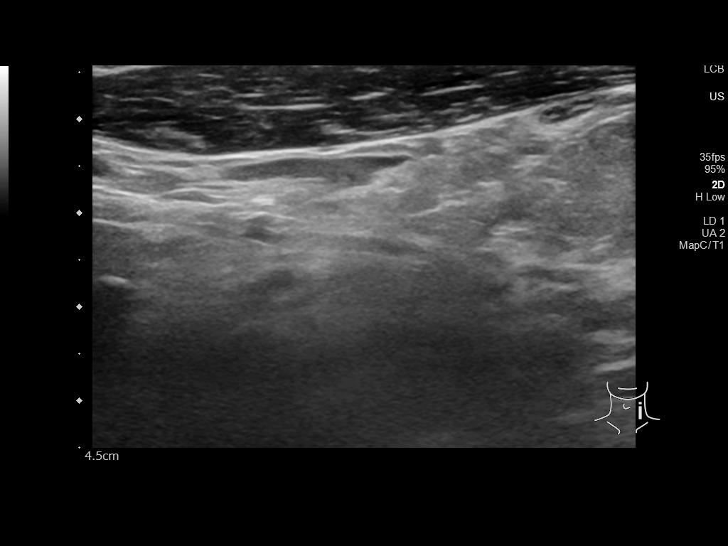

[13 of 25 positions shown; findings below may reference images not displayed]

FINDINGS: Parenchymal Echotexture: Normal

Isthmus: 0.4 cm

Right lobe: 4.7 x 2.3 x 1.8 cm

Left lobe: 3.7 x 1.6 x 1.5 cm

_________________________________________________________

Estimated total number of nodules >/= 1 cm: 1

Number of spongiform nodules >/=  2 cm not described below (TR1): 0

Number of mixed cystic and solid nodules >/= 1.5 cm not described
below (TR2): 0

_________________________________________________________

Nodule # 1:

Location: Right; Mid

Maximum size: 1.3 cm; Other 2 dimensions: 1.1 x 1.1 cm

Composition: solid/almost completely solid (2)

Echogenicity: very hypoechoic (3)

Shape: not taller-than-wide (0)

Margins: smooth (0)

Echogenic foci: none (0)

ACR TI-RADS total points: 5.

ACR TI-RADS risk category: TR4 (4-6 points).

ACR TI-RADS recommendations:

*Given size (>/= 1 - 1.4 cm) and appearance, a follow-up ultrasound
in 1 year should be considered based on TI-RADS criteria.

_________________________________________________________
IMPRESSION: The hypermetabolic lesion identified on the prior PET-CT corresponds
with a 1.3 cm TI-RADS category 4 nodule in the deep aspect of the
right mid gland. Hypermetabolic activity on PET is associated with
an approximately 40% malignancy rate and supersedes the TI-RADS
classification system. Recommend further evaluation with fine-needle
aspiration biopsy.

The above is in keeping with the ACR TI-RADS recommendations - [HOSPITAL] 6738;[DATE].

## 2022-05-15 NOTE — Progress Notes (Deleted)
Jared Shea OFFICE PROGRESS NOTE  [name], DO [address] [place] [address]  DIAGNOSIS: Stage IV (TX, N2, M1 C) non-small cell lung cancer, adenocarcinoma diagnosed in August 2021 and presented with solitary brain metastasis in addition to mediastinal lymphadenopathy.   PDL1 Expression 70%   Molecular Biomarkers:  Tumor Mutational Burden - 52 Muts/Mb Microsatellite status - MS-Stable Genomic Findings For a complete list of the genes assayed, please refer to the Appendix. NF1 E1694* MTAP loss exons 2-8 RICTOR amplification ATRX A419V BRAF K483E CDKN2A/B CDKN2A loss, CDKN2B loss DNMT3A E205* FGF10 amplification NTRK1 amplification - equivocal? 7 Disease relevant genes with no reportable alterations: ALK, EGFR, ERBB2, KRAS, MET, RET, ROS1     PRIOR THERAPY: 1) Status post right craniotomy with tumor resection followed by [facility] to solitary brain metastasis under the care of Dr. Lisbeth Renshaw and Dr. Sherral Hammers. 2) Concurrent chemoradiation with weekly carboplatin for AUC of 2 and paclitaxel 45 mg/M2.  First dose 02/01/2020. Status post 5 cycles.  Last dose was given February 29, 2020. 3) First-line treatment with immunotherapy with Libtayo (Cempilimab) 350 mg IV every 3 weeks.  First dose April 25, 2020.  Status post 35 cycles.     CURRENT THERAPY: Observation ***  INTERVAL HISTORY: Jared Shea. 67 y.o. male returns to the clinic today for follow-up visit.  The patient recently completed 2 years of immunotherapy with Libtayo and tolerated well overall.  He also recently had a follow-up visit with Dr. Mickeal Skinner for his history of metastatic disease to the brain.  Today the patient denies any fever, chills, or night sweats.  His appetite and weight are stable.  He has occasional shortness of breath if he flexes his neck forward.  He denies any cough, chest pain, or hemoptysis.  Denies any nausea, vomiting, diarrhea, or constipation.  Denies any rashes or skin changes.   Denies any headaches or vision changes.  The patient did get diagnosed recently with diabetes in October 2023.  He recently had a restaging CT scan performed.  He is here today for evaluation to review his scan results and for more detailed discussion about his current condition and next steps in his care.     MEDICAL HISTORY: Past Medical History:  Diagnosis Date   Brain mass    Bursitis of right hip    Chest pain 08/12/2018   Chronic cough    Dependence on nicotine from cigarettes    Diabetes mellitus without complication ([place])    Essential hypertension 07/28/2018   nscl ca dx'd 12/2019   Seizures ([place])    Viral illness 03/23/2019    ALLERGIES:  is allergic to penicillins.  MEDICATIONS:  Current Outpatient Medications  Medication Sig Dispense Refill   albuterol (VENTOLIN HFA) 108 (90 Base) MCG/ACT inhaler Inhale 2 puffs into the lungs every 6 (six) hours as needed for wheezing or shortness of breath. 8 g 2   blood glucose meter kit and supplies KIT Dispense based on patient and insurance preference. Use up to three times daily as directed. 1 each 0   celecoxib (CELEBREX) 200 MG capsule Take 1 capsule (200 mg total) by mouth 2 (two) times daily. 30 capsule 3   Continuous Blood Gluc Receiver ([place]) DEVI Please use as directed to check your blood sugar. 1 each 0   Continuous Blood Gluc Sensor (DEXCOM G7 SENSOR) MISC Please use as directed. Place on skin for glucose monitoring. Change sensors every 10 days. 3 each 11  glucose blood (ONETOUCH VERIO) test strip TEST 3 TIMES DAILY 100 strip 11   HYDROcodone-acetaminophen (NORCO) 5-325 MG tablet Take 1 tablet by mouth every 6 (six) hours as needed for moderate pain. 30 tablet 0   insulin detemir (LEVEMIR) 100 UNIT/ML injection Inject 0.1 mLs (10 Units total) into the skin daily. 10 mL 11   Insulin Syringe-Needle U-100 (INSULIN SYRINGE .3CC/31GX5/16") 31G X 5/16" 0.3 ML MISC daily. use as directed     Insulin Syringes,  Disposable, U-100 0.3 ML MISC 0.1 mLs by Does not apply route at bedtime. 100 each 9   Lancets (ONETOUCH DELICA PLUS [id number]) MISC 3 (three) times daily. as directed     levETIRAcetam (KEPPRA) 750 MG tablet Take 1 tablet (750 mg total) by mouth 2 (two) times daily. 180 tablet 4   losartan (COZAAR) 25 MG tablet TAKE 1 TABLET (25 MG TOTAL) DAILY. 90 tablet 3   metFORMIN (GLUCOPHAGE) 500 MG tablet Take 2 tablets (1,000 mg total) by mouth 2 (two) times daily with a meal. Start taking 1 tablet (500 mg) by mouth 2 (two) times daily for 1 week, increase by 500 mg so total 1500 mg for 1 week, then increase to 2 tablets (1000 mg) by mouth 2 (two) times daily 120 tablet 11   nortriptyline (PAMELOR) 25 MG capsule TAKE 2 CAPSULES(50 MG) BY MOUTH AT BEDTIME 60 capsule 5   omeprazole (PRILOSEC) 20 MG capsule Take 1 capsule (20 mg total) by mouth daily. Pt takes in the morning 90 capsule 1   sildenafil (VIAGRA) 50 MG tablet Take 2 tablets (100 mg total) by mouth daily as needed for erectile dysfunction. 30 tablet 3   No current facility-administered medications for this visit.    SURGICAL HISTORY:  Past Surgical History:  Procedure Laterality Date   APPLICATION OF CRANIAL NAVIGATION N/A 01/07/2020   Procedure: APPLICATION OF CRANIAL NAVIGATION;  Surgeon: Ashok Pall, MD;  Location: [facility];  Service: Neurosurgery;  Laterality: N/A;   CRANIOTOMY Right 01/07/2020   Procedure: RIGHT CRANIOTOMY FOR TUMOR RESECTION;  Surgeon: Ashok Pall, MD;  Location: [facility];  Service: Neurosurgery;  Laterality: Right;  rm 21   ENDOBRONCHIAL ULTRASOUND N/A 12/21/2019   Procedure: ENDOBRONCHIAL ULTRASOUND;  Surgeon: Laurin Coder, MD;  Location: [facility];  Service: Pulmonary;  Laterality: N/A;   FINE NEEDLE ASPIRATION  12/21/2019   Procedure: FINE NEEDLE ASPIRATION (FNA) LINEAR;  Surgeon: Laurin Coder, MD;  Location: [facility];  Service: Pulmonary;;   IR IMAGING GUIDED PORT INSERTION  03/03/2020   NO PAST  SURGERIES     VIDEO BRONCHOSCOPY N/A 12/21/2019   Procedure: VIDEO BRONCHOSCOPY WITHOUT FLUORO;  Surgeon: Laurin Coder, MD;  Location: [facility];  Service: Pulmonary;  Laterality: N/A;    REVIEW OF SYSTEMS:   Review of Systems  Constitutional: Negative for appetite change, chills, fatigue, fever and unexpected weight change.  HENT:   Negative for mouth sores, nosebleeds, sore throat and trouble swallowing.   Eyes: Negative for eye problems and icterus.  Respiratory: Negative for cough, hemoptysis, shortness of breath and wheezing.   Cardiovascular: Negative for chest pain and leg swelling.  Gastrointestinal: Negative for abdominal pain, constipation, diarrhea, nausea and vomiting.  Genitourinary: Negative for bladder incontinence, difficulty urinating, dysuria, frequency and hematuria.   Musculoskeletal: Negative for back pain, gait problem, neck pain and neck stiffness.  Skin: Negative for itching and rash.  Neurological: Negative for dizziness, extremity weakness, gait problem, headaches, light-headedness and seizures.  Hematological: Negative for adenopathy. Does  not bruise/bleed easily.  Psychiatric/Behavioral: Negative for confusion, depression and sleep disturbance. The patient is not nervous/anxious.     PHYSICAL EXAMINATION:  There were no vitals taken for this visit.  ECOG PERFORMANCE STATUS: {CHL ONC ECOG [id number]  Physical Exam  Constitutional: Oriented to person, place, and time and well-developed, well-nourished, and in no distress. No distress.  HENT:  Head: Normocephalic and atraumatic.  Mouth/Throat: Oropharynx is clear and moist. No oropharyngeal exudate.  Eyes: Conjunctivae are normal. Right eye exhibits no discharge. Left eye exhibits no discharge. No scleral icterus.  Neck: Normal range of motion. Neck supple.  Cardiovascular: Normal rate, regular rhythm, normal heart sounds and intact distal pulses.   Pulmonary/Chest: Effort normal and breath sounds  normal. No respiratory distress. No wheezes. No rales.  Abdominal: Soft. Bowel sounds are normal. Exhibits no distension and no mass. There is no tenderness.  Musculoskeletal: Normal range of motion. Exhibits no edema.  Lymphadenopathy:    No cervical adenopathy.  Neurological: Alert and oriented to person, place, and time. Exhibits normal muscle tone. Gait normal. Coordination normal.  Skin: Skin is warm and dry. No rash noted. Not diaphoretic. No erythema. No pallor.  Psychiatric: Mood, memory and judgment normal.  Vitals reviewed.  LABORATORY DATA: Lab Results  Component Value Date   WBC 6.0 04/10/2022   HGB 12.3 (L) 04/10/2022   HCT 36.4 (L) 04/10/2022   MCV 88.8 04/10/2022   PLT 342 04/10/2022      Chemistry      Component Value Date/Time   NA 138 04/10/2022 1016   NA 140 11/23/2019 1516   K 3.8 04/10/2022 1016   CL 107 04/10/2022 1016   CO2 25 04/10/2022 1016   BUN 22 04/10/2022 1016   BUN 14 11/23/2019 1516   CREATININE 0.73 04/10/2022 1016      Component Value Date/Time   CALCIUM 9.6 04/10/2022 1016   ALKPHOS 67 04/10/2022 1016   AST 17 04/10/2022 1016   ALT 19 04/10/2022 1016   BILITOT 0.7 04/10/2022 1016       RADIOGRAPHIC STUDIES:  MR BRAIN W WO CONTRAST  Result Date: 04/27/2022 CLINICAL DATA:  Brain/CNS neoplasm. Assess treatment response. Follow-up brain metastases. Metastatic non-small cell lung cancer EXAM: MRI HEAD WITHOUT AND WITH CONTRAST TECHNIQUE: Multiplanar, multiecho pulse sequences of the brain and surrounding structures were obtained without and with intravenous contrast. CONTRAST:  9 ccVueway COMPARISON:  10/13/2021 FINDINGS: Brain: Diffusion imaging does not show any acute or subacute infarction or other cause of restricted diffusion. No abnormality affects the brainstem or cerebellum. There is been previous right frontoparietal craniotomy. Changes of prior surgery at the right posterior frontal region with some focal gliosis and volume loss.  Stable appearance. No abnormal enhancement. No sign of residual or recurrent tumor. Otherwise, there are scattered old small vessel infarctions of the cerebral hemispheric white matter. No other abnormality seen on this examination. Vascular: Major vessels at the base of the brain show flow. Skull and upper cervical spine: Negative otherwise Sinuses/Orbits: Clear/normal Other: None IMPRESSION: 1. Stable examination. No evidence of residual or recurrent tumor. Previous right frontoparietal craniotomy. Focal gliosis and volume loss in the right posterior frontal region. 2. Scattered old small-vessel ischemic changes of the cerebral hemispheric white matter. Electronically Signed   By: Nelson Chimes M.D.   On: 04/27/2022 18:55     ASSESSMENT/PLAN:  This is a very pleasant 66 year old African-American male diagnosed with stage IV (Tx, N2, M1c) non-small cell lung cancer, adenocarcinoma.  He presented with  a solitary brain metastasis in addition to right hilar and mediastinal lymphadenopathy.  He was diagnosed in August 2021.     The patient is status post right craniotomy with resection of the solitary brain metastasis with SRS under the care of Dr. Lisbeth Renshaw and Dr. Christella Noa.    He completed weekly concurrent chemoradiation with carboplatin for an AUC of 2 and paclitaxel 45 mg per metered square.  He is status post 5 cycles.  He tolerated it well except for some dysphagia/odynophagia.  The patient then had evidence of new brain metastases.  He is status post SRS to the new lesions on 04/22/2020 under the care of Dr. Lisbeth Renshaw.   The patient recently completed immunotherapy with Libtayo IV every 3 weeks.  He is status post 35 cycles and tolerated it well except for arthralgias.    Patient recently had a restaging CT scan performed.  Dr. Julien Nordmann personally independently reviewed the scan and discussed the results with the patient today.  The scan showed ***  Dr. Julien Nordmann discussed the data with the patient that  Orlene Erm is approved for 2 years.  He was given the option to continue versus proceeding on observation.  The patient opted to ***  I will arrange for restaging CT scan of the chest in 3 months.  Flush  He will continue to follow closely with Dr. Mickeal Skinner from neuro-oncology.  Will continue to follow with his PCP for his diabetes.  The patient was advised to call immediately if she has any concerning symptoms in the interval. The patient voices understanding of current disease status and treatment options and is in agreement with the current care plan. All questions were answered. The patient knows to call the clinic with any problems, questions or concerns. We can certainly see the patient much sooner if necessary     No orders of the defined types were placed in this encounter.    I spent {CHL ONC TIME VISIT - PQAES:[id number]} counseling the patient face to face. The total time spent in the appointment was {CHL ONC TIME VISIT - YTRZN:[id number]}.  Yanis Juma L Jojo Pehl, PA-C 05/15/22

## 2022-05-15 NOTE — Progress Notes (Signed)
This nurse received a call from [facility] stating that this patient has requested that his port be accessed for IV contrast for CT scan.  Imaging access orders have been entered for them to release.  No further concerns at this time.

## 2022-05-16 ENCOUNTER — Ambulatory Visit
Admission: RE | Admit: 2022-05-16 | Discharge: 2022-05-16 | Disposition: A | Discharge status: Home / Self Care | Payer: Medicare HMO | Source: Ambulatory Visit | Attending: Physician Assistant | Admitting: Physician Assistant

## 2022-05-16 ENCOUNTER — Other Ambulatory Visit: Payer: Self-pay

## 2022-05-16 DIAGNOSIS — J432 Centrilobular emphysema: Secondary | ICD-10-CM | POA: Diagnosis not present

## 2022-05-16 DIAGNOSIS — I2510 Atherosclerotic heart disease of native coronary artery without angina pectoris: Secondary | ICD-10-CM | POA: Diagnosis not present

## 2022-05-16 DIAGNOSIS — Z95828 Presence of other vascular implants and grafts: Secondary | ICD-10-CM

## 2022-05-16 DIAGNOSIS — J929 Pleural plaque without asbestos: Secondary | ICD-10-CM | POA: Diagnosis not present

## 2022-05-16 DIAGNOSIS — K5730 Diverticulosis of large intestine without perforation or abscess without bleeding: Secondary | ICD-10-CM | POA: Diagnosis not present

## 2022-05-16 DIAGNOSIS — C799 Secondary malignant neoplasm of unspecified site: Secondary | ICD-10-CM

## 2022-05-16 DIAGNOSIS — I700 Atherosclerosis of aorta: Secondary | ICD-10-CM | POA: Diagnosis not present

## 2022-05-16 DIAGNOSIS — C3490 Malignant neoplasm of unspecified part of unspecified bronchus or lung: Secondary | ICD-10-CM | POA: Diagnosis not present

## 2022-05-16 MED ORDER — SODIUM CHLORIDE 0.9% FLUSH
10.0000 mL | INTRAVENOUS | Status: DC | PRN
  Administered 2022-05-16: 10 mL via INTRAVENOUS

## 2022-05-16 MED ORDER — HEPARIN SOD (PORK) LOCK FLUSH 100 UNIT/ML IV SOLN
500.0000 [IU] | Freq: Once | INTRAVENOUS | Status: AC
  Administered 2022-05-16: 500 [IU] via INTRAVENOUS

## 2022-05-16 MED ORDER — IOPAMIDOL (ISOVUE-300) INJECTION 61%
100.0000 mL | Freq: Once | INTRAVENOUS | Status: AC | PRN
  Administered 2022-05-16: 100 mL via INTRAVENOUS

## 2022-05-17 ENCOUNTER — Inpatient Hospital Stay: Payer: Medicare HMO

## 2022-05-17 ENCOUNTER — Telehealth: Payer: Self-pay | Admitting: Physician Assistant

## 2022-05-17 ENCOUNTER — Inpatient Hospital Stay: Payer: Medicare HMO | Admitting: Physician Assistant

## 2022-05-17 NOTE — Telephone Encounter (Signed)
Called patient and left voicemail regarding missed appointment. Contacted patients sister to get r/s. Patient r/s and sister will notify patient.

## 2022-05-18 ENCOUNTER — Other Ambulatory Visit: Payer: Medicare HMO

## 2022-05-18 ENCOUNTER — Ambulatory Visit: Payer: Medicare HMO | Admitting: Physician Assistant

## 2022-05-18 NOTE — Progress Notes (Signed)
[facility] OFFICE PROGRESS NOTE  [name], DO [address] [place] [address]  DIAGNOSIS: Stage IV (TX, N2, M1 C) non-small cell lung cancer, adenocarcinoma diagnosed in August 2021 and presented with solitary brain metastasis in addition to mediastinal lymphadenopathy.   PDL1 Expression 70%   Molecular Biomarkers:  Tumor Mutational Burden - 52 Muts/Mb Microsatellite status - MS-Stable Genomic Findings For a complete list of the genes assayed, please refer to the Appendix. NF1 E1694* MTAP loss exons 2-8 RICTOR amplification ATRX A419V BRAF K483E CDKN2A/B CDKN2A loss, CDKN2B loss DNMT3A E205* FGF10 amplification NTRK1 amplification - equivocal? 7 Disease relevant genes with no reportable alterations: ALK, EGFR, ERBB2, KRAS, MET, RET, ROS1   PRIOR THERAPY: 1) Status post right craniotomy with tumor resection followed by [facility] to solitary brain metastasis under the care of Dr. Lisbeth Renshaw and Dr. Sherral Hammers. 2) Concurrent chemoradiation with weekly carboplatin for AUC of 2 and paclitaxel 45 mg/M2.  First dose 02/01/2020. Status post 5 cycles.  Last dose was given February 29, 2020. 3) First-line treatment with immunotherapy with Libtayo (Cempilimab) 350 mg IV every 3 weeks.  First dose April 25, 2020.  Status post 35 cycles.     CURRENT THERAPY: Observation ***  INTERVAL HISTORY: Jared Shea. 67 y.o. male returns returns to the clinic today for follow-up visit.  The patient recently completed 2 years of immunotherapy with Libtayo and tolerated well overall.  He also recently had a follow-up visit with Dr. Mickeal Skinner for his history of metastatic disease to the brain.  Today the patient denies any fever, chills, or night sweats.  His appetite and weight are stable.  He has occasional shortness of breath if he flexes his neck forward.  He denies any cough, chest pain, or hemoptysis.  Denies any nausea, vomiting, diarrhea, or constipation.  Denies any rashes or skin changes.   Denies any headaches or vision changes.  The patient did get diagnosed recently with diabetes in October 2023.  He recently had a restaging CT scan performed.  He is here today for evaluation to review his scan results and for more detailed discussion about his current condition and next steps in his care.  MEDICAL HISTORY: Past Medical History:  Diagnosis Date   Brain mass    Bursitis of right hip    Chest pain 08/12/2018   Chronic cough    Dependence on nicotine from cigarettes    Diabetes mellitus without complication ([place])    Essential hypertension 07/28/2018   nscl ca dx'd 12/2019   Seizures ([place])    Viral illness 03/23/2019    ALLERGIES:  is allergic to penicillins.  MEDICATIONS:  Current Outpatient Medications  Medication Sig Dispense Refill   albuterol (VENTOLIN HFA) 108 (90 Base) MCG/ACT inhaler Inhale 2 puffs into the lungs every 6 (six) hours as needed for wheezing or shortness of breath. 8 g 2   blood glucose meter kit and supplies KIT Dispense based on patient and insurance preference. Use up to three times daily as directed. 1 each 0   celecoxib (CELEBREX) 200 MG capsule Take 1 capsule (200 mg total) by mouth 2 (two) times daily. 30 capsule 3   Continuous Blood Gluc Receiver ([place]) DEVI Please use as directed to check your blood sugar. 1 each 0   Continuous Blood Gluc Sensor (DEXCOM G7 SENSOR) MISC Please use as directed. Place on skin for glucose monitoring. Change sensors every 10 days. 3 each 11   glucose blood (ONETOUCH VERIO)  test strip TEST 3 TIMES DAILY 100 strip 11   HYDROcodone-acetaminophen (NORCO) 5-325 MG tablet Take 1 tablet by mouth every 6 (six) hours as needed for moderate pain. 30 tablet 0   insulin detemir (LEVEMIR) 100 UNIT/ML injection Inject 0.1 mLs (10 Units total) into the skin daily. 10 mL 11   Insulin Syringe-Needle U-100 (INSULIN SYRINGE .3CC/31GX5/16") 31G X 5/16" 0.3 ML MISC daily. use as directed     Insulin Syringes, Disposable,  U-100 0.3 ML MISC 0.1 mLs by Does not apply route at bedtime. 100 each 9   Lancets (ONETOUCH DELICA PLUS [id number]) MISC 3 (three) times daily. as directed     levETIRAcetam (KEPPRA) 750 MG tablet Take 1 tablet (750 mg total) by mouth 2 (two) times daily. 180 tablet 4   losartan (COZAAR) 25 MG tablet TAKE 1 TABLET (25 MG TOTAL) DAILY. 90 tablet 3   metFORMIN (GLUCOPHAGE) 500 MG tablet Take 2 tablets (1,000 mg total) by mouth 2 (two) times daily with a meal. Start taking 1 tablet (500 mg) by mouth 2 (two) times daily for 1 week, increase by 500 mg so total 1500 mg for 1 week, then increase to 2 tablets (1000 mg) by mouth 2 (two) times daily 120 tablet 11   nortriptyline (PAMELOR) 25 MG capsule TAKE 2 CAPSULES(50 MG) BY MOUTH AT BEDTIME 60 capsule 5   omeprazole (PRILOSEC) 20 MG capsule Take 1 capsule (20 mg total) by mouth daily. Pt takes in the morning 90 capsule 1   sildenafil (VIAGRA) 50 MG tablet Take 2 tablets (100 mg total) by mouth daily as needed for erectile dysfunction. 30 tablet 3   No current facility-administered medications for this visit.    SURGICAL HISTORY:  Past Surgical History:  Procedure Laterality Date   APPLICATION OF CRANIAL NAVIGATION N/A 01/07/2020   Procedure: APPLICATION OF CRANIAL NAVIGATION;  Surgeon: Ashok Pall, MD;  Location: [facility];  Service: Neurosurgery;  Laterality: N/A;   CRANIOTOMY Right 01/07/2020   Procedure: RIGHT CRANIOTOMY FOR TUMOR RESECTION;  Surgeon: Ashok Pall, MD;  Location: [facility];  Service: Neurosurgery;  Laterality: Right;  rm 21   ENDOBRONCHIAL ULTRASOUND N/A 12/21/2019   Procedure: ENDOBRONCHIAL ULTRASOUND;  Surgeon: Laurin Coder, MD;  Location: [facility];  Service: Pulmonary;  Laterality: N/A;   FINE NEEDLE ASPIRATION  12/21/2019   Procedure: FINE NEEDLE ASPIRATION (FNA) LINEAR;  Surgeon: Laurin Coder, MD;  Location: [facility];  Service: Pulmonary;;   IR IMAGING GUIDED PORT INSERTION  03/03/2020   NO PAST SURGERIES      VIDEO BRONCHOSCOPY N/A 12/21/2019   Procedure: VIDEO BRONCHOSCOPY WITHOUT FLUORO;  Surgeon: Laurin Coder, MD;  Location: [facility];  Service: Pulmonary;  Laterality: N/A;    REVIEW OF SYSTEMS:   Review of Systems  Constitutional: Negative for appetite change, chills, fatigue, fever and unexpected weight change.  HENT:   Negative for mouth sores, nosebleeds, sore throat and trouble swallowing.   Eyes: Negative for eye problems and icterus.  Respiratory: Negative for cough, hemoptysis, shortness of breath and wheezing.   Cardiovascular: Negative for chest pain and leg swelling.  Gastrointestinal: Negative for abdominal pain, constipation, diarrhea, nausea and vomiting.  Genitourinary: Negative for bladder incontinence, difficulty urinating, dysuria, frequency and hematuria.   Musculoskeletal: Negative for back pain, gait problem, neck pain and neck stiffness.  Skin: Negative for itching and rash.  Neurological: Negative for dizziness, extremity weakness, gait problem, headaches, light-headedness and seizures.  Hematological: Negative for adenopathy. Does not bruise/bleed easily.  Psychiatric/Behavioral: Negative for confusion, depression and sleep disturbance. The patient is not nervous/anxious.     PHYSICAL EXAMINATION:  There were no vitals taken for this visit.  ECOG PERFORMANCE STATUS: {CHL ONC ECOG [id number]  Physical Exam  Constitutional: Oriented to person, place, and time and well-developed, well-nourished, and in no distress. No distress.  HENT:  Head: Normocephalic and atraumatic.  Mouth/Throat: Oropharynx is clear and moist. No oropharyngeal exudate.  Eyes: Conjunctivae are normal. Right eye exhibits no discharge. Left eye exhibits no discharge. No scleral icterus.  Neck: Normal range of motion. Neck supple.  Cardiovascular: Normal rate, regular rhythm, normal heart sounds and intact distal pulses.   Pulmonary/Chest: Effort normal and breath sounds normal. No  respiratory distress. No wheezes. No rales.  Abdominal: Soft. Bowel sounds are normal. Exhibits no distension and no mass. There is no tenderness.  Musculoskeletal: Normal range of motion. Exhibits no edema.  Lymphadenopathy:    No cervical adenopathy.  Neurological: Alert and oriented to person, place, and time. Exhibits normal muscle tone. Gait normal. Coordination normal.  Skin: Skin is warm and dry. No rash noted. Not diaphoretic. No erythema. No pallor.  Psychiatric: Mood, memory and judgment normal.  Vitals reviewed.  LABORATORY DATA: Lab Results  Component Value Date   WBC 6.0 04/10/2022   HGB 12.3 (L) 04/10/2022   HCT 36.4 (L) 04/10/2022   MCV 88.8 04/10/2022   PLT 342 04/10/2022      Chemistry      Component Value Date/Time   NA 138 04/10/2022 1016   NA 140 11/23/2019 1516   K 3.8 04/10/2022 1016   CL 107 04/10/2022 1016   CO2 25 04/10/2022 1016   BUN 22 04/10/2022 1016   BUN 14 11/23/2019 1516   CREATININE 0.73 04/10/2022 1016      Component Value Date/Time   CALCIUM 9.6 04/10/2022 1016   ALKPHOS 67 04/10/2022 1016   AST 17 04/10/2022 1016   ALT 19 04/10/2022 1016   BILITOT 0.7 04/10/2022 1016       RADIOGRAPHIC STUDIES:  CT CHEST ABDOMEN PELVIS W CONTRAST  Result Date: 05/17/2022 CLINICAL DATA:  Metastatic non-small cell lung cancer restaging * Tracking Code: BO * EXAM: CT CHEST, ABDOMEN, AND PELVIS WITH CONTRAST TECHNIQUE: Multidetector CT imaging of the chest, abdomen and pelvis was performed following the standard protocol during bolus administration of intravenous contrast. RADIATION DOSE REDUCTION: This exam was performed according to the departmental dose-optimization program which includes automated exposure control, adjustment of the mA and/or kV according to patient size and/or use of iterative reconstruction technique. CONTRAST:  137mL ISOVUE-300 IOPAMIDOL (ISOVUE-300) INJECTION 61% oral enteric contrast COMPARISON:  01/11/2022 FINDINGS: CT CHEST  FINDINGS Cardiovascular: Right chest port catheter. Aortic atherosclerosis. Normal heart size. Three-vessel coronary artery calcifications. No pericardial effusion. Mediastinum/Nodes: No enlarged mediastinal, hilar, or axillary lymph nodes. Thyroid gland, trachea, and esophagus demonstrate no significant findings. Lungs/Pleura: Mild centrilobular emphysema. Diffuse bilateral bronchial wall thickening. Continued interval decrease in size of a spiculated nodule posterior right upper lobe, abutting and tenting the major fissure, now measuring 0.6 x 0.3 cm, previously 0.8 x 0.6 cm (series 6, image 68). Previously seen subpleural nodularity and consolidation of the bilateral lung bases is almost completely resolved, with residual scarring and ground-glass (series 6, image 118). No pleural effusion or pneumothorax. Musculoskeletal: No chest wall abnormality. No acute osseous findings. CT ABDOMEN PELVIS FINDINGS Hepatobiliary: No solid liver abnormality is seen. No gallstones, gallbladder wall thickening, or biliary dilatation. Pancreas: Unremarkable. No pancreatic ductal dilatation  or surrounding inflammatory changes. Spleen: Normal in size without significant abnormality. Adrenals/Urinary Tract: Adrenal glands are unremarkable. Kidneys are normal, without renal calculi, solid lesion, or hydronephrosis. Bladder is unremarkable. Stomach/Bowel: Stomach is within normal limits. Appendix appears normal. No evidence of bowel wall thickening, distention, or inflammatory changes. Sigmoid diverticula. Vascular/Lymphatic: Aortic atherosclerosis. No enlarged abdominal or pelvic lymph nodes. Reproductive: Mild prostatomegaly. Other: No abdominal wall hernia or abnormality. No ascites. Musculoskeletal: No acute osseous findings. IMPRESSION: 1. Continued interval decrease in size of a spiculated nodule of the posterior right upper lobe. 2. Previously seen subpleural nodularity and consolidation of the bilateral lung bases is almost  completely resolved, with residual scarring and ground-glass, consistent with resolving infection or inflammation. 3. No evidence of lymphadenopathy or metastatic disease in the abdomen or pelvis. 4. Emphysema and diffuse bilateral bronchial wall thickening. 5. Coronary artery disease. 6. Mild prostatomegaly. Aortic Atherosclerosis (ICD10-I70.0) and Emphysema (ICD10-J43.9). Electronically Signed   By: Delanna Ahmadi M.D.   On: 05/17/2022 22:31   MR BRAIN W WO CONTRAST  Result Date: 04/27/2022 CLINICAL DATA:  Brain/CNS neoplasm. Assess treatment response. Follow-up brain metastases. Metastatic non-small cell lung cancer EXAM: MRI HEAD WITHOUT AND WITH CONTRAST TECHNIQUE: Multiplanar, multiecho pulse sequences of the brain and surrounding structures were obtained without and with intravenous contrast. CONTRAST:  9 ccVueway COMPARISON:  10/13/2021 FINDINGS: Brain: Diffusion imaging does not show any acute or subacute infarction or other cause of restricted diffusion. No abnormality affects the brainstem or cerebellum. There is been previous right frontoparietal craniotomy. Changes of prior surgery at the right posterior frontal region with some focal gliosis and volume loss. Stable appearance. No abnormal enhancement. No sign of residual or recurrent tumor. Otherwise, there are scattered old small vessel infarctions of the cerebral hemispheric white matter. No other abnormality seen on this examination. Vascular: Major vessels at the base of the brain show flow. Skull and upper cervical spine: Negative otherwise Sinuses/Orbits: Clear/normal Other: None IMPRESSION: 1. Stable examination. No evidence of residual or recurrent tumor. Previous right frontoparietal craniotomy. Focal gliosis and volume loss in the right posterior frontal region. 2. Scattered old small-vessel ischemic changes of the cerebral hemispheric white matter. Electronically Signed   By: Nelson Chimes M.D.   On: 04/27/2022 18:55      ASSESSMENT/PLAN:  This is a very pleasant 67 year old African-American male diagnosed with stage IV (Tx, N2, M1c) non-small cell lung cancer, adenocarcinoma.  He presented with a solitary brain metastasis in addition to right hilar and mediastinal lymphadenopathy.  He was diagnosed in August 2021.     The patient is status post right craniotomy with resection of the solitary brain metastasis with SRS under the care of Dr. Lisbeth Renshaw and Dr. Christella Noa.    He completed weekly concurrent chemoradiation with carboplatin for an AUC of 2 and paclitaxel 45 mg per metered square.  He is status post 5 cycles.  He tolerated it well except for some dysphagia/odynophagia.  The patient then had evidence of new brain metastases.  He is status post SRS to the new lesions on 04/22/2020 under the care of Dr. Lisbeth Renshaw.   The patient recently completed immunotherapy with Libtayo IV every 3 weeks.  He is status post 35 cycles and tolerated it well except for arthralgias.    Patient recently had a restaging CT scan performed.  Dr. Julien Nordmann personally independently reviewed the scan and discussed the results with the patient today.  The scan showed ***  Dr. Julien Nordmann discussed the data with the patient that  Orlene Erm is approved for 2 years.  He was given the option to continue versus proceeding on observation.  The patient opted to ***  I will arrange for restaging CT scan of the chest in 3 months.  Flush  He will continue to follow closely with Dr. Mickeal Skinner from neuro-oncology.  Will continue to follow with his PCP for his diabetes.  The patient was advised to call immediately if she has any concerning symptoms in the interval. The patient voices understanding of current disease status and treatment options and is in agreement with the current care plan. All questions were answered. The patient knows to call the clinic with any problems, questions or concerns. We can certainly see the patient much sooner if  necessary   No orders of the defined types were placed in this encounter.    I spent {CHL ONC TIME VISIT - WIOXB:[id number]} counseling the patient face to face. The total time spent in the appointment was {CHL ONC TIME VISIT - STMHD:[id number]}.  Tallie Dodds L Hebe Merriwether, PA-C 05/18/22

## 2022-05-20 NOTE — Progress Notes (Unsigned)
CC: HTN and DMII follow up  HPI:  [name] is a 67 y.o. with medical history of HTN, DMII, Non-small cell lung cancer stage IV,  and hx of seizures presenting to [facility] for DMII follow up. PCP is Dr. Markus Jarvis and last seen by him on 03/16/22.   Please see problem-based list for further details, assessments, and plans.  Past Medical History:  Diagnosis Date   Brain mass    Bursitis of right hip    Chest pain 08/12/2018   Chronic cough    Dependence on nicotine from cigarettes    Diabetes mellitus without complication ([place])    Essential hypertension 07/28/2018   nscl ca dx'd 12/2019   Seizures ([place])    Viral illness 03/23/2019    Current Outpatient Medications (Endocrine & Metabolic):    insulin detemir (LEVEMIR) 100 UNIT/ML injection, Inject 0.1 mLs (10 Units total) into the skin daily.   metFORMIN (GLUCOPHAGE) 500 MG tablet, Take 2 tablets (1,000 mg total) by mouth 2 (two) times daily with a meal. Start taking 1 tablet (500 mg) by mouth 2 (two) times daily for 1 week, increase by 500 mg so total 1500 mg for 1 week, then increase to 2 tablets (1000 mg) by mouth 2 (two) times daily  Current Outpatient Medications (Cardiovascular):    losartan (COZAAR) 25 MG tablet, TAKE 1 TABLET (25 MG TOTAL) DAILY.   sildenafil (VIAGRA) 50 MG tablet, Take 2 tablets (100 mg total) by mouth daily as needed for erectile dysfunction.  Current Outpatient Medications (Respiratory):    albuterol (VENTOLIN HFA) 108 (90 Base) MCG/ACT inhaler, Inhale 2 puffs into the lungs every 6 (six) hours as needed for wheezing or shortness of breath.  Current Outpatient Medications (Analgesics):    celecoxib (CELEBREX) 200 MG capsule, Take 1 capsule (200 mg total) by mouth 2 (two) times daily.   HYDROcodone-acetaminophen (NORCO) 5-325 MG tablet, Take 1 tablet by mouth every 6 (six) hours as needed for moderate pain.   Current Outpatient Medications (Other):    blood glucose meter kit and supplies KIT, Dispense  based on patient and insurance preference. Use up to three times daily as directed.   Continuous Blood Gluc Receiver ([place]) DEVI, Please use as directed to check your blood sugar.   Continuous Blood Gluc Sensor (DEXCOM G7 SENSOR) MISC, Please use as directed. Place on skin for glucose monitoring. Change sensors every 10 days.   glucose blood (ONETOUCH VERIO) test strip, TEST 3 TIMES DAILY   Insulin Syringe-Needle U-100 (INSULIN SYRINGE .3CC/31GX5/16") 31G X 5/16" 0.3 ML MISC, daily. use as directed   Insulin Syringes, Disposable, U-100 0.3 ML MISC, 0.1 mLs by Does not apply route at bedtime.   Lancets (ONETOUCH DELICA PLUS [id number]) MISC, 3 (three) times daily. as directed   levETIRAcetam (KEPPRA) 750 MG tablet, Take 1 tablet (750 mg total) by mouth 2 (two) times daily.   nortriptyline (PAMELOR) 25 MG capsule, TAKE 2 CAPSULES(50 MG) BY MOUTH AT BEDTIME   omeprazole (PRILOSEC) 20 MG capsule, Take 1 capsule (20 mg total) by mouth daily. Pt takes in the morning  Review of Systems:  Review of system negative unless stated in the problem list or HPI.    Physical Exam:  There were no vitals filed for this visit.  Physical Exam General: NAD HENT: NCAT Lungs: CTAB, no wheeze, rhonchi or rales.  Cardiovascular: Normal heart sounds, no r/m/g, 2+ pulses in all extremities. No LE edema Abdomen: No TTP, normal bowel sounds MSK:  No asymmetry or muscle atrophy.  Skin: no lesions noted on exposed skin Neuro: Alert and oriented x4. CN grossly intact Psych: Normal mood and normal affect   Assessment & Plan:   No problem-specific Assessment & Plan notes found for this encounter.   See Encounters Tab for problem based charting.  Patient discussed with Dr. {NAMES:[id number]::"Guilloud","Hoffman","Mullen","Narendra","Vincent","Machen","Lau","Hatcher"} Idamae Schuller, MD Tillie Rung. [facility], PGY-2

## 2022-05-21 ENCOUNTER — Encounter: Payer: Self-pay | Admitting: Internal Medicine

## 2022-05-21 ENCOUNTER — Ambulatory Visit (INDEPENDENT_AMBULATORY_CARE_PROVIDER_SITE_OTHER): Payer: Medicare HMO | Admitting: Internal Medicine

## 2022-05-21 ENCOUNTER — Ambulatory Visit (HOSPITAL_COMMUNITY)
Admission: RE | Admit: 2022-05-21 | Disposition: A | Discharge status: Home / Self Care | Payer: Medicare HMO | Source: Ambulatory Visit

## 2022-05-21 VITALS — BP 131/61 | HR 65 | Temp 97.8°F | Wt 181.2 lb

## 2022-05-21 DIAGNOSIS — C3490 Malignant neoplasm of unspecified part of unspecified bronchus or lung: Secondary | ICD-10-CM | POA: Insufficient documentation

## 2022-05-21 DIAGNOSIS — Z7901 Long term (current) use of anticoagulants: Secondary | ICD-10-CM | POA: Insufficient documentation

## 2022-05-21 DIAGNOSIS — I493 Ventricular premature depolarization: Secondary | ICD-10-CM

## 2022-05-21 DIAGNOSIS — I10 Essential (primary) hypertension: Secondary | ICD-10-CM | POA: Insufficient documentation

## 2022-05-21 DIAGNOSIS — Z0000 Encounter for general adult medical examination without abnormal findings: Secondary | ICD-10-CM

## 2022-05-21 DIAGNOSIS — N529 Male erectile dysfunction, unspecified: Secondary | ICD-10-CM | POA: Diagnosis not present

## 2022-05-21 DIAGNOSIS — I499 Cardiac arrhythmia, unspecified: Secondary | ICD-10-CM | POA: Diagnosis not present

## 2022-05-21 DIAGNOSIS — Z7984 Long term (current) use of oral hypoglycemic drugs: Secondary | ICD-10-CM | POA: Insufficient documentation

## 2022-05-21 DIAGNOSIS — Z87891 Personal history of nicotine dependence: Secondary | ICD-10-CM | POA: Diagnosis not present

## 2022-05-21 DIAGNOSIS — Z1211 Encounter for screening for malignant neoplasm of colon: Secondary | ICD-10-CM

## 2022-05-21 DIAGNOSIS — Z79899 Other long term (current) drug therapy: Secondary | ICD-10-CM | POA: Insufficient documentation

## 2022-05-21 DIAGNOSIS — E119 Type 2 diabetes mellitus without complications: Secondary | ICD-10-CM

## 2022-05-21 HISTORY — DX: Ventricular premature depolarization: I49.3

## 2022-05-21 LAB — POCT GLYCOSYLATED HEMOGLOBIN (HGB A1C): Hemoglobin A1C: 5.7 % — AB (ref 4.0–5.6)

## 2022-05-21 LAB — GLUCOSE, CAPILLARY: Glucose-Capillary: 203 mg/dL — ABNORMAL HIGH (ref 70–99)

## 2022-05-21 MED ORDER — SILDENAFIL CITRATE 50 MG PO TABS: 100.0000 mg | ORAL_TABLET | Freq: Every day | ORAL | 3 refills | Status: DC | PRN

## 2022-05-21 MED ORDER — METFORMIN HCL ER 500 MG PO TB24: ORAL_TABLET | ORAL | 0 refills | Status: DC

## 2022-05-21 NOTE — Assessment & Plan Note (Addendum)
BP elevated initially at 152/51 but repeat is normal at 131/61. Pt normotensive at other office visits. On Losartan 25 mg qd. Does not check BP at home.  -Continue Losartan at current dose.

## 2022-05-21 NOTE — Assessment & Plan Note (Signed)
A1c was 9.2 03/01/22 and it is 5.7 this visit. CBG elevated at 203 in the clinic. On Metformin 500 mg daily and insulin 10 units daily. Weight stable around 180s but slightly down 4 lbs from last month. Most common CBG is 120s. With improved A1c, we can safely discontinue insulin and up-titrate his metformin to 1000 mg BID gradually. Can consider SGLT 2 inhibitor over GLP given weight loss affect with GLP and pt's normal BMI and having active malignancy.

## 2022-05-21 NOTE — Assessment & Plan Note (Signed)
>>  ASSESSMENT AND PLAN FOR UNCONTROLLED TYPE 2 DIABETES MELLITUS WITH HYPERGLYCEMIA, WITHOUT LONG-TERM CURRENT USE OF INSULIN  (H[facility]) WRITTEN ON 05/21/2022  2:20 PM BY FERNAND, GHALIB, MD  A1c was 9.2 03/01/22 and it is 5.7 this visit. CBG elevated at 203 in the clinic. On Metformin  500 mg daily and insulin  10 units daily. Weight stable around 180s but slightly down 4 lbs from last month. Most common CBG is 120s. With improved A1c, we can safely discontinue insulin  and up-titrate his metformin  to 1000 mg BID gradually. Can consider SGLT 2 inhibitor over GLP given weight loss affect with GLP and pt's normal BMI and having active malignancy.

## 2022-05-21 NOTE — Assessment & Plan Note (Signed)
PT with PVC with 30 % burden. Asymptomatic. Plan was to obtain BMP and Mag at visit today but pt insisted on getting lab work done tomorrow with access through his port. Will also order echocardiogram to ensure to structural heart disease.  -Follow up labs  -Follow up echocardiogram

## 2022-05-21 NOTE — Assessment & Plan Note (Addendum)
Flu shot offered and pt states he will get it tomorrow at cancer center. Eye referral placed. Order for screening colonoscopy placed.

## 2022-05-21 NOTE — Patient Instructions (Addendum)
[name], it was a pleasure seeing you today! You endorsed feeling well today. Below are some of the things we talked about this visit. We look forward to seeing you in the follow up appointment!  Today we discussed: You came for a follow up on your diabetes. Your A1c is improved and we will discontinue your insulin. We will change you metformin so it will cause less side effects.  You have some irregular beats in your heart. We will check some labs and get an ultrasound of your heart.   I have ordered the following labs today:  Lab Orders         Glucose, capillary         BMP8+Anion Gap         Magnesium         POC Hbg A1C       Referrals ordered today:   Referral Orders         Ambulatory referral to Ophthalmology       I have ordered the following medication/changed the following medications:   Stop the following medications: Medications Discontinued During This Encounter  Medication Reason   omeprazole (PRILOSEC) 20 MG capsule Patient has not taken in last 30 days   sildenafil (VIAGRA) 50 MG tablet Reorder   insulin detemir (LEVEMIR) 100 UNIT/ML injection Change in therapy   metFORMIN (GLUCOPHAGE) 500 MG tablet Change in therapy   Insulin Syringe-Needle U-100 (INSULIN SYRINGE .3CC/31GX5/16") 31G X 5/16" 0.3 ML MISC Change in therapy   Insulin Syringes, Disposable, U-100 0.3 ML MISC Completed Course     Start the following medications: Meds ordered this encounter  Medications   sildenafil (VIAGRA) 50 MG tablet    Sig: Take 2 tablets (100 mg total) by mouth daily as needed for erectile dysfunction.    Dispense:  30 tablet    Refill:  3   metFORMIN (GLUCOPHAGE-XR) 500 MG 24 hr tablet    Sig: Take 1 tablet (500 mg total) by mouth 2 (two) times daily with a meal for 7 days, THEN 1 tablet (500 mg total) 3 (three) times daily for 7 days, THEN 2 tablets (1,000 mg total) 2 (two) times daily with a meal for 14 days.    Dispense:  91 tablet    Refill:  0      Follow-up: 1 month follow up  Please make sure to arrive 15 minutes prior to your next appointment. If you arrive late, you may be asked to reschedule.   We look forward to seeing you next time. Please call our clinic at [phone] if you have any questions or concerns. The best time to call is Monday-Friday from 9am-4pm, but there is someone available 24/7. If after hours or the weekend, call the main hospital number and ask for the Internal Medicine Resident On-Call. If you need medication refills, please notify your pharmacy one week in advance and they will send [place] a request.  Thank you for letting us take part in your care. Wishing you the best!  Thank you, Idamae Schuller, MD

## 2022-05-22 ENCOUNTER — Inpatient Hospital Stay: Payer: Medicare HMO | Attending: Internal Medicine | Admitting: Physician Assistant

## 2022-05-22 ENCOUNTER — Ambulatory Visit: Payer: Medicare HMO

## 2022-05-22 ENCOUNTER — Other Ambulatory Visit: Payer: Self-pay

## 2022-05-22 ENCOUNTER — Inpatient Hospital Stay: Payer: Medicare HMO

## 2022-05-22 VITALS — BP 155/87 | HR 92 | Temp 97.2°F | Resp 16 | Wt 180.3 lb

## 2022-05-22 DIAGNOSIS — Z79899 Other long term (current) drug therapy: Secondary | ICD-10-CM | POA: Insufficient documentation

## 2022-05-22 DIAGNOSIS — Z23 Encounter for immunization: Secondary | ICD-10-CM

## 2022-05-22 DIAGNOSIS — C7931 Secondary malignant neoplasm of brain: Secondary | ICD-10-CM | POA: Insufficient documentation

## 2022-05-22 DIAGNOSIS — E119 Type 2 diabetes mellitus without complications: Secondary | ICD-10-CM | POA: Insufficient documentation

## 2022-05-22 DIAGNOSIS — I10 Essential (primary) hypertension: Secondary | ICD-10-CM | POA: Insufficient documentation

## 2022-05-22 DIAGNOSIS — Z7984 Long term (current) use of oral hypoglycemic drugs: Secondary | ICD-10-CM | POA: Diagnosis not present

## 2022-05-22 DIAGNOSIS — C3490 Malignant neoplasm of unspecified part of unspecified bronchus or lung: Secondary | ICD-10-CM | POA: Insufficient documentation

## 2022-05-22 DIAGNOSIS — C3491 Malignant neoplasm of unspecified part of right bronchus or lung: Secondary | ICD-10-CM | POA: Diagnosis not present

## 2022-05-22 DIAGNOSIS — Z9221 Personal history of antineoplastic chemotherapy: Secondary | ICD-10-CM | POA: Insufficient documentation

## 2022-05-22 DIAGNOSIS — I499 Cardiac arrhythmia, unspecified: Secondary | ICD-10-CM | POA: Diagnosis not present

## 2022-05-22 DIAGNOSIS — Z87891 Personal history of nicotine dependence: Secondary | ICD-10-CM | POA: Diagnosis not present

## 2022-05-22 DIAGNOSIS — Z95828 Presence of other vascular implants and grafts: Secondary | ICD-10-CM

## 2022-05-22 DIAGNOSIS — Z923 Personal history of irradiation: Secondary | ICD-10-CM | POA: Insufficient documentation

## 2022-05-22 LAB — CBC WITH DIFFERENTIAL (CANCER CENTER ONLY)
Abs Immature Granulocytes: 0.02 10*3/uL (ref 0.00–0.07)
Basophils Absolute: 0 10*3/uL (ref 0.0–0.1)
Basophils Relative: 1 %
Eosinophils Absolute: 0.1 10*3/uL (ref 0.0–0.5)
Eosinophils Relative: 1 %
HCT: 35.2 % — ABNORMAL LOW (ref 39.0–52.0)
Hemoglobin: 12.4 g/dL — ABNORMAL LOW (ref 13.0–17.0)
Immature Granulocytes: 0 %
Lymphocytes Relative: 22 %
Lymphs Abs: 1.4 10*3/uL (ref 0.7–4.0)
MCH: 30.7 pg (ref 26.0–34.0)
MCHC: 35.2 g/dL (ref 30.0–36.0)
MCV: 87.1 fL (ref 80.0–100.0)
Monocytes Absolute: 0.5 10*3/uL (ref 0.1–1.0)
Monocytes Relative: 8 %
Neutro Abs: 4.4 10*3/uL (ref 1.7–7.7)
Neutrophils Relative %: 68 %
Platelet Count: 308 10*3/uL (ref 150–400)
RBC: 4.04 MIL/uL — ABNORMAL LOW (ref 4.22–5.81)
RDW: 16.1 % — ABNORMAL HIGH (ref 11.5–15.5)
WBC Count: 6.4 10*3/uL (ref 4.0–10.5)
nRBC: 0 % (ref 0.0–0.2)

## 2022-05-22 MED ORDER — HEPARIN SOD (PORK) LOCK FLUSH 100 UNIT/ML IV SOLN
500.0000 [IU] | Freq: Once | INTRAVENOUS | Status: AC
  Administered 2022-05-22: 500 [IU]

## 2022-05-22 MED ORDER — INFLUENZA VAC A&B SA ADJ QUAD 0.5 ML IM PRSY
0.5000 mL | PREFILLED_SYRINGE | Freq: Once | INTRAMUSCULAR | Status: AC
  Administered 2022-05-22: 0.5 mL via INTRAMUSCULAR
  Filled 2022-05-22: qty 0.5

## 2022-05-22 MED ORDER — SODIUM CHLORIDE 0.9% FLUSH
10.0000 mL | Freq: Once | INTRAVENOUS | Status: AC
  Administered 2022-05-22: 10 mL

## 2022-05-23 NOTE — Progress Notes (Signed)
Internal Medicine Clinic Attending  Case discussed with Dr. Khan  At the time of the visit.  We reviewed the resident's history and exam and pertinent patient test results.  I agree with the assessment, diagnosis, and plan of care documented in the resident's note.  

## 2022-05-30 DIAGNOSIS — R5383 Other fatigue: Secondary | ICD-10-CM | POA: Diagnosis not present

## 2022-05-30 DIAGNOSIS — U071 COVID-19: Secondary | ICD-10-CM | POA: Diagnosis not present

## 2022-05-30 DIAGNOSIS — Z6824 Body mass index (BMI) 24.0-24.9, adult: Secondary | ICD-10-CM | POA: Diagnosis not present

## 2022-05-31 ENCOUNTER — Other Ambulatory Visit: Payer: Self-pay | Admitting: Radiation Therapy

## 2022-06-06 DIAGNOSIS — Z20822 Contact with and (suspected) exposure to covid-19: Secondary | ICD-10-CM | POA: Diagnosis not present

## 2022-06-06 DIAGNOSIS — Z03818 Encounter for observation for suspected exposure to other biological agents ruled out: Secondary | ICD-10-CM | POA: Diagnosis not present

## 2022-06-06 DIAGNOSIS — I10 Essential (primary) hypertension: Secondary | ICD-10-CM | POA: Diagnosis not present

## 2022-06-06 DIAGNOSIS — Z6824 Body mass index (BMI) 24.0-24.9, adult: Secondary | ICD-10-CM | POA: Diagnosis not present

## 2022-06-07 ENCOUNTER — Other Ambulatory Visit: Payer: Self-pay | Admitting: Neurology

## 2022-06-20 ENCOUNTER — Ambulatory Visit (INDEPENDENT_AMBULATORY_CARE_PROVIDER_SITE_OTHER): Payer: Medicare HMO | Admitting: Student

## 2022-06-20 VITALS — BP 133/76 | HR 89 | Ht 73.0 in | Wt 175.7 lb

## 2022-06-20 DIAGNOSIS — I10 Essential (primary) hypertension: Secondary | ICD-10-CM | POA: Diagnosis not present

## 2022-06-20 DIAGNOSIS — Z794 Long term (current) use of insulin: Secondary | ICD-10-CM | POA: Diagnosis not present

## 2022-06-20 DIAGNOSIS — E119 Type 2 diabetes mellitus without complications: Secondary | ICD-10-CM

## 2022-06-20 DIAGNOSIS — C3491 Malignant neoplasm of unspecified part of right bronchus or lung: Secondary | ICD-10-CM | POA: Diagnosis not present

## 2022-06-20 DIAGNOSIS — Z87891 Personal history of nicotine dependence: Secondary | ICD-10-CM | POA: Diagnosis not present

## 2022-06-20 DIAGNOSIS — Z9641 Presence of insulin pump (external) (internal): Secondary | ICD-10-CM

## 2022-06-20 DIAGNOSIS — Z0000 Encounter for general adult medical examination without abnormal findings: Secondary | ICD-10-CM

## 2022-06-20 DIAGNOSIS — Z1211 Encounter for screening for malignant neoplasm of colon: Secondary | ICD-10-CM | POA: Insufficient documentation

## 2022-06-20 DIAGNOSIS — G40209 Localization-related (focal) (partial) symptomatic epilepsy and epileptic syndromes with complex partial seizures, not intractable, without status epilepticus: Secondary | ICD-10-CM | POA: Diagnosis not present

## 2022-06-20 LAB — GLUCOSE, CAPILLARY: Glucose-Capillary: 113 mg/dL — ABNORMAL HIGH (ref 70–99)

## 2022-06-20 MED ORDER — METFORMIN HCL 1000 MG PO TABS: 1000.0000 mg | ORAL_TABLET | Freq: Two times a day (BID) | ORAL | 2 refills | Status: DC

## 2022-06-20 NOTE — Assessment & Plan Note (Signed)
Patient living with HTN, on losartan 25mg  daily. Initial BP elevated to 163/88 but repeat improved to 133/76. Previous visits with similar improvements on repeat measurements.  -continue losartan -check repeat BP measurements at all future visits

## 2022-06-20 NOTE — Assessment & Plan Note (Addendum)
He has been doing well since last visit. Currently taking metformin 1000mg  in the morning and 500mg  at night, which he is tolerating well. Sent new prescription today for metformin 1000mg  BID to maximize him on this medicine. Consider addition of SGLT-2i in the future instead of GLP-1a therapy given that patient with normal BMI and active malignancy (avoid weight loss effects of GLP-1a).  He wished to check a capillary BG today to see if it differed from his home meter. CBG 113.  Plan: -increased metformin to 1000mg  BID -f/u in 2 months for repeat A1c -consider adding SGLT-2i in the future if needed

## 2022-06-20 NOTE — Assessment & Plan Note (Signed)
Chronic and stable. On keppra.

## 2022-06-20 NOTE — Assessment & Plan Note (Deleted)
Placed referral to GI for screening colonoscopy.

## 2022-06-20 NOTE — Assessment & Plan Note (Signed)
>>  ASSESSMENT AND PLAN FOR UNCONTROLLED TYPE 2 DIABETES MELLITUS WITH HYPERGLYCEMIA, WITHOUT LONG-TERM CURRENT USE OF INSULIN  ([facility]) WRITTEN ON 06/20/2022 10:47 AM BY JINWALA, SAGAR, MD  He has been doing well since last visit. Currently taking metformin  1000mg  in the morning and 500mg  at night, which he is tolerating well. Sent new prescription today for metformin  1000mg  BID to maximize him on this medicine. Consider addition of SGLT-2i in the future instead of GLP-1a therapy given that patient with normal BMI and active malignancy (avoid weight loss effects of GLP-1a).  He wished to check a capillary BG today to see if it differed from his home meter. CBG 113.  Plan: -increased metformin  to 1000mg  BID -f/u in 2 months for repeat A1c -consider adding SGLT-2i in the future if needed

## 2022-06-20 NOTE — Patient Instructions (Addendum)
[name],  It was a pleasure seeing you in the clinic today.   Your blood pressure looks good today. I have placed a referral to the stomach doctors for your colonoscopy. They will reach out to you to schedule this. I have prescribed a new dose of metformin. For the new prescription, please take 1 tablet twice a day. For your pain, please reach out to your cancer doctor for a refill since they have been the ones that have filled this medicine previously. Please come back in 2 months for your next visit.  Please call our clinic at [phone] if you have any questions or concerns. The best time to call is Monday-Friday from 9am-4pm, but there is someone available 24/7 at the same number. If you need medication refills, please notify your pharmacy one week in advance and they will send [place] a request.   Thank you for letting us take part in your care. We look forward to seeing you next time!

## 2022-06-20 NOTE — Assessment & Plan Note (Signed)
Refused tetanus vaccination today.

## 2022-06-20 NOTE — Progress Notes (Signed)
   CC: f/u HTN  HPI:  [name] is a 67 y.o. male with history listed below presenting to the [facility] for f/u HTN. Please see individualized problem based charting for full HPI.  Past Medical History:  Diagnosis Date   Brain mass    Bursitis of right hip    Chest pain 08/12/2018   Chronic cough    Dependence on nicotine from cigarettes    Diabetes mellitus without complication ([place])    Dysuria 04/03/2021   Essential hypertension 07/28/2018   Frequent headaches 06/27/2020   nscl ca dx'd 12/2019   Polyarthralgia 06/15/2020   Seizures ([place])    Viral illness 03/23/2019    Review of Systems:  Negative aside from that listed in individualized problem based charting.  Physical Exam:  Vitals:   06/20/22 0944 06/20/22 1029  BP: (!) 163/88 133/76  Pulse: 87 89  SpO2: 100%   Weight: 175 lb 11.2 oz (79.7 kg)   Height: 6\' 1"  (1.854 m)    Physical Exam Constitutional:      General: He is not in acute distress.    Appearance: Normal appearance. He is normal weight.  HENT:     Mouth/Throat:     Mouth: Mucous membranes are moist.     Pharynx: Oropharynx is clear.  Eyes:     Extraocular Movements: Extraocular movements intact.     Conjunctiva/sclera: Conjunctivae normal.     Pupils: Pupils are equal, round, and reactive to light.  Cardiovascular:     Rate and Rhythm: Normal rate and regular rhythm.     Heart sounds: Normal heart sounds. No murmur heard. Pulmonary:     Effort: Pulmonary effort is normal. No respiratory distress.     Breath sounds: Normal breath sounds.  Abdominal:     General: Bowel sounds are normal. There is no distension.     Palpations: Abdomen is soft.     Tenderness: There is no abdominal tenderness.  Skin:    General: Skin is warm and dry.  Neurological:     Mental Status: He is alert and oriented to person, place, and time. Mental status is at baseline.  Psychiatric:        Mood and Affect: Mood normal.        Behavior: Behavior normal.       Assessment & Plan:   See Encounters Tab for problem based charting.  Patient discussed with Dr. Heber Shasta

## 2022-06-20 NOTE — Assessment & Plan Note (Signed)
Patient with stage 4 NSCC of lung. He had a solitary brain metastasis that was treated with R craniotomy. He is s/p chemoradiation and is following oncology regularly. Most recent imaging appears stable/slightly improved. He is currently on observation with periodic imaging and follow up.   He is complaining of generalized pain today which he reports has been managed with norco prn. PDMP reviewed and appears that oncology is prescribing this. Discussed reaching out to them for additional refills so that we have a single provider managing pain.

## 2022-06-20 NOTE — Assessment & Plan Note (Signed)
Patient with limited life expectancy given Stage 4 lung cancer with metastasis. We participated in shared decision making in regards to utility of colonoscopy screening for colon cancer. He would like to avoid colonoscopy if possible. Will update HM tab to reflect this.

## 2022-06-21 NOTE — Progress Notes (Signed)
Internal Medicine Clinic Attending  Case discussed with Dr. Jinwala  At the time of the visit.  We reviewed the resident's history and exam and pertinent patient test results.  I agree with the assessment, diagnosis, and plan of care documented in the resident's note.  

## 2022-06-24 ENCOUNTER — Other Ambulatory Visit: Payer: Self-pay | Admitting: Internal Medicine

## 2022-06-24 DIAGNOSIS — E119 Type 2 diabetes mellitus without complications: Secondary | ICD-10-CM

## 2022-06-27 ENCOUNTER — Ambulatory Visit (HOSPITAL_COMMUNITY)
Admission: RE | Admit: 2022-06-27 | Discharge: 2022-06-27 | Disposition: A | Discharge status: Home / Self Care | Payer: Medicare HMO | Source: Ambulatory Visit | Attending: Internal Medicine | Admitting: Internal Medicine

## 2022-06-27 DIAGNOSIS — E119 Type 2 diabetes mellitus without complications: Secondary | ICD-10-CM | POA: Insufficient documentation

## 2022-06-27 DIAGNOSIS — I10 Essential (primary) hypertension: Secondary | ICD-10-CM | POA: Diagnosis not present

## 2022-06-27 DIAGNOSIS — I080 Rheumatic disorders of both mitral and aortic valves: Secondary | ICD-10-CM | POA: Diagnosis not present

## 2022-06-27 DIAGNOSIS — I493 Ventricular premature depolarization: Secondary | ICD-10-CM | POA: Insufficient documentation

## 2022-06-27 DIAGNOSIS — C3490 Malignant neoplasm of unspecified part of unspecified bronchus or lung: Secondary | ICD-10-CM | POA: Insufficient documentation

## 2022-06-27 DIAGNOSIS — I499 Cardiac arrhythmia, unspecified: Secondary | ICD-10-CM | POA: Diagnosis not present

## 2022-06-27 DIAGNOSIS — Z87891 Personal history of nicotine dependence: Secondary | ICD-10-CM | POA: Insufficient documentation

## 2022-06-27 DIAGNOSIS — R9431 Abnormal electrocardiogram [ECG] [EKG]: Secondary | ICD-10-CM

## 2022-06-27 LAB — ECHOCARDIOGRAM COMPLETE
Area-P 1/2: 5.16 cm2
MV M vel: 5.51 m/s
MV Peak grad: 121.4 mmHg
MV VTI: 9.42 cm2
P 1/2 time: 335 msec
Radius: 0.3 cm
S' Lateral: 4.4 cm

## 2022-06-27 NOTE — Progress Notes (Signed)
  Echocardiogram 2D Echocardiogram has been performed.  Jared Shea M 06/27/2022, 3:02 PM

## 2022-07-03 ENCOUNTER — Inpatient Hospital Stay: Payer: Medicare HMO | Attending: Internal Medicine

## 2022-07-09 ENCOUNTER — Other Ambulatory Visit: Payer: Self-pay | Admitting: Neurology

## 2022-07-10 ENCOUNTER — Telehealth: Payer: Self-pay | Admitting: Dietician

## 2022-07-10 NOTE — Telephone Encounter (Signed)
Jared Shea says he doe snot have his CGM equipment. I suggested he call his pharmacy and have them get it ready for him to pick up tomorrow. He verbalized understanding and acknowledged his appointment at 1:15 PM.

## 2022-07-11 ENCOUNTER — Ambulatory Visit (INDEPENDENT_AMBULATORY_CARE_PROVIDER_SITE_OTHER): Payer: Medicare HMO | Admitting: Dietician

## 2022-07-11 DIAGNOSIS — H2512 Age-related nuclear cataract, left eye: Secondary | ICD-10-CM | POA: Diagnosis not present

## 2022-07-11 DIAGNOSIS — E119 Type 2 diabetes mellitus without complications: Secondary | ICD-10-CM

## 2022-07-11 DIAGNOSIS — H538 Other visual disturbances: Secondary | ICD-10-CM | POA: Diagnosis not present

## 2022-07-11 DIAGNOSIS — H25811 Combined forms of age-related cataract, right eye: Secondary | ICD-10-CM | POA: Diagnosis not present

## 2022-07-11 DIAGNOSIS — C719 Malignant neoplasm of brain, unspecified: Secondary | ICD-10-CM | POA: Diagnosis not present

## 2022-07-11 LAB — HM DIABETES EYE EXAM

## 2022-07-11 NOTE — Patient Instructions (Signed)
Thank you for your visit today!  We talked about:  Continuous glucose monitoring Increasing your protein intake Eat more...  Whole grains: whole grain cereal, whole wheat crackers, whole wheat bread and pasta, old fashioned oats & brown rice Whole fruits-1 cup a day Vegetables- 2-3 cups a day Lowfat Protein: Beans, chicken, [place], yogurt, lean cuts of beef and pork, fish 2-3 times a week Nuts, Seeds and Soy: walnuts, peanuts, pecans, peanut butter  Foods with omega-3 fatty acids- salmon, tuna, flax seeds, chia seeds Foods with polyphenols (berries, apples, citrus, dark chocolate, tea, coffee)  Eat Less... Refined carb foods (white bread, white rice, some cereals, cake, candy) ?Sugar-sweetened drinks ?Red meat  ?Processed meats (bacon, sausage, cold cuts) ?Margarine, shortening, lard ?Ultra-processed foods (packaged snacks, poultry nuggets, instant noodles, candy, additives)  Please feel free to call me anytime. We can follow up as needed Continuous glucose monitoring or 1 year whichever comes first.    Butch Penny [phone]

## 2022-07-11 NOTE — Progress Notes (Signed)
Lab Results  Component Value Date   HGBA1C 5.7 (A) 05/21/2022   HGBA1C 9.2 (A) 03/01/2022   HGBA1C 4.7 03/23/2021   HGBA1C 4.4 04/11/2020   HGBA1C 7.6 (H) 01/04/2020    Estimated body mass index is 23.18 kg/m as calculated from the following:   Height as of 06/20/22: '6\' 1"'$  (1.854 m).   Weight as of 06/20/22: 175 lb 11.2 oz (79.7 kg).   Visit Type: First/Initial  Appt. Start Time: 1315 Appt. End Time: 1400  07/11/2022  Jared Shea, identified by name and date of birth, is a 67 y.o. male with a diagnosis of Diabetes: Type 2.   ASSESSMENT  He was advised to try not to lose any weight and eat more protein and nutrient dense foods. He reports he had decreased appetite, but that is better now. He was referred for Continuous glucose monitoring when he was on insulin and had a higher A1c, but he is off insulin now and his A1c is back down to almost non diabetic range on only metformin.   He may be interested in the professional Continuous glucose monitoring in the future and agreed to let me know.   Diabetes Self-Management Education - 07/11/22 1500       Visit Information   Visit Type First/Initial      Initial Visit   Diabetes Type Type 2    Date Diagnosed 2021    Are you currently following a meal plan? No   limits bread, starches sugary drinks   Are you taking your medications as prescribed? Yes      Health Coping   How would you rate your overall health? Fair   cancer and arthriis     Psychosocial Assessment   Patient Belief/Attitude about Diabetes Motivated to manage diabetes    What is the hardest part about your diabetes right now, causing you the most concern, or is the most worrisome to you about your diabetes?   Other (comment)   cancer   Self-care barriers None    Self-management support Doctor's office;CDE visits    Patient Concerns Monitoring;Healthy Lifestyle    Special Needs None    Preferred Learning Style No preference indicated    Learning Readiness  Contemplating    How often do you need to have someone help you when you read instructions, pamphlets, or other written materials from your doctor or pharmacy? 2 - Rarely    What is the last grade level you completed in school? 12      Pre-Education Assessment   Patient understands the diabetes disease and treatment process. Comprehends key points    Patient understands incorporating nutritional management into lifestyle. Comprehends key points    Patient undertands incorporating physical activity into lifestyle. Comprehends key points    Patient understands using medications safely. Comprehends key points    Patient understands monitoring blood glucose, interpreting and using results Needs Review    Patient understands prevention, detection, and treatment of acute complications. Comprehends key points    Patient understands prevention, detection, and treatment of chronic complications. Demonstrates understanding / competency    Patient understands how to develop strategies to address psychosocial issues. Comprehends key points    Patient understands how to develop strategies to promote health/change behavior. Demonstrates understanding / competency   since cancer diagnosis he has been tkaing better care of himself     Complications   Last HgB A1C per patient/outside source 5.7 %    How often do you check your  blood sugar? --   every other week in the evening before bed, usually ~ 150   Fasting Blood glucose range (mg/dL) 70-129;130-179    Postprandial Blood glucose range (mg/dL) --   unknown   Number of hypoglycemic episodes per month 0    Number of hyperglycemic episodes ( >'200mg'$ /dL): Rare    Can you tell when your blood sugar is high? Yes    What do you do if your blood sugar is high? takes medicine, cut back on starches and sugars    Have you had a dilated eye exam in the past 12 months? Yes    Have you had a dental exam in the past 12 months? Yes    Are you checking your feet? Yes     How many days per week are you checking your feet? 1      Dietary Intake   Breakfast 2 eggs, juice or green tea no sugar added    Lunch storebought penut butter crackers or oreos or ice cream    Dinner sandwich bolgna or ham and cheese or salad sometimes has meat or cheese   does not eat fish often   Beverage(s) water, juice, green tea   used to drink milk but stopped, he is thinking about restarting after our discussion today     Activity / Exercise   Activity / Exercise Type ADL's;Light (walking / raking leaves)    How many days per week do you exercise? --   need to assess in more detail     Patient Education   Previous Diabetes Education No    Monitoring Taught/evaluated CGM (comment)             Individualized Plan for Diabetes Self-Management Training:   Learning Objective:  Patient will have a greater understanding of diabetes self-management. Patient education plan is to attend individual and/or group sessions per assessed needs and concerns.   Plan:   Patient Instructions  Thank you for your visit today!  We talked about:  Continuous glucose monitoring Increasing your protein intake Eat more...  Whole grains: whole grain cereal, whole wheat crackers, whole wheat bread and pasta, old fashioned oats & brown rice Whole fruits-1 cup a day Vegetables- 2-3 cups a day Lowfat Protein: Beans, chicken, [place], yogurt, lean cuts of beef and pork, fish 2-3 times a week Nuts, Seeds and Soy: walnuts, peanuts, pecans, peanut butter  Foods with omega-3 fatty acids- salmon, tuna, flax seeds, chia seeds Foods with polyphenols (berries, apples, citrus, dark chocolate, tea, coffee)  Eat Less... Refined carb foods (white bread, white rice, some cereals, cake, candy) ?Sugar-sweetened drinks ?Red meat  ?Processed meats (bacon, sausage, cold cuts) ?Margarine, shortening, lard ?Ultra-processed foods (packaged snacks, poultry nuggets, instant noodles, candy, additives)  Please feel  free to call me anytime. We can follow up as needed Continuous glucose monitoring or 1 year whichever comes first.    Butch Penny [phone]   Expected Outcomes:     Education material provided: Diabetes Resources  If problems or questions, patient to contact team via:  Phone  Future DSME appointment:  6 months to 1 year. Debera Lat, RD 07/11/2022 4:10 PM.

## 2022-07-17 ENCOUNTER — Other Ambulatory Visit: Payer: Self-pay

## 2022-07-17 ENCOUNTER — Other Ambulatory Visit: Payer: Self-pay | Admitting: Internal Medicine

## 2022-07-17 DIAGNOSIS — I493 Ventricular premature depolarization: Secondary | ICD-10-CM

## 2022-07-17 DIAGNOSIS — I428 Other cardiomyopathies: Secondary | ICD-10-CM

## 2022-07-17 DIAGNOSIS — I499 Cardiac arrhythmia, unspecified: Secondary | ICD-10-CM

## 2022-07-17 MED ORDER — METOPROLOL TARTRATE 25 MG PO TABS: 25.0000 mg | ORAL_TABLET | Freq: Two times a day (BID) | ORAL | 1 refills | Status: DC

## 2022-07-17 NOTE — Telephone Encounter (Signed)
Pharmacy requesting a 90 day supply

## 2022-07-18 ENCOUNTER — Ambulatory Visit: Payer: Medicare HMO | Admitting: Internal Medicine

## 2022-07-18 NOTE — Progress Notes (Signed)
Cardiology Office Note:    Date:  07/18/2022   ID:  Jared Simmonds., DOB 08/05/1955, MRN [id number]  PCP:  [name], [place] Prov[facility] Cardiologist:  Lenna Sciara, MD Referring MD: [name], DO   Chief Complaint/Reason for Referral: PVCs and cardiomyopathy  PATIENT DID NOT APPEAR FOR APPOINTMENT   ASSESSMENT:    1. Cardiomyopathy, unspecified type ([place])   2. Type 2 diabetes mellitus without complication, without long-term current use of insulin ([place])   3. Hypertension associated with diabetes ([place])   4. Hyperlipidemia associated with type 2 diabetes mellitus ([place])   5. Aortic atherosclerosis ([place])   6. Coronary artery calcification seen on CAT scan   7. PVC (premature ventricular contraction)     PLAN:    In order of problems listed above: 1.  Mild cardiomyopathy: This may be due to his PVC burden or his history of chemotherapy as an echocardiogram in 2020 showed normal function.  Will transition to Toprol-XL, start Jardiance, and continue losartan.  Will check echocardiogram in 3 months.  If echocardiogram still shows diminished function will consider ischemic evaluation however he did have a stress test in 2020 which demonstrated no ischemia.  He does have three-vessel coronary artery calcification so this could represent balanced ischemia however.  He is relatively young and does have metastatic non-small cell lung cancer which will need to be factored into decision making. 2.  Type 2 diabetes: Start aspirin 81 mg daily, continue losartan, start atorvastatin 20 mg daily, and start Jardiance 10 mg daily. 3.  Hypertension: 4.  Hyperlipidemia: Will check lipid panel, LFTs, and LP(a) in 2 months after starting atorvastatin.  Goal LDL for diabetic is less than 70. 5.  Aortic atherosclerosis: Start aspirin and atorvastatin, and pursue strict blood pressure control. 6.  Coronary artery calcification: See discussion above.          History of Present  Illness:    FOCUSED PROBLEM LIST:   1.  Type 2 diabetes on metformin 2.  Hypertension 3.  Aortic atherosclerosis with coronary artery calcifications on chest CT 2024 4.  Non-small cell lung cancer stage IV metastatic to the brain followed by oncology; patient was treated with carboplatin, Taxol, palonosetron and XRT in 2021; currently on cemiplimab immunotherapy  The patient is a 67 y.o. male with the indicated medical history here for recommendations regarding PVCs and incidentally noted mild cardiomyopathy.          Current Medications: No outpatient medications have been marked as taking for the 07/18/22 encounter (Office Visit) with Early Osmond, MD.     Allergies:    Penicillins   Social History:   Social History   Tobacco Use   Smoking status: Former    Packs/day: 1.00    Years: 48.00    Total pack years: 48.00    Types: Cigarettes    Start date: 08/07/1970    Quit date: 12/15/2019    Years since quitting: 2.5   Smokeless tobacco: Never  Vaping Use   Vaping Use: Never used  Substance Use Topics   Alcohol use: Not Currently   Drug use: Never     Family Hx: Family History  Problem Relation Age of Onset   Heart disease Mother        CABG   Kidney disease Sister    Diabetes Brother    Kidney disease Brother        KIDNEY TRANSPLANT   Hypertension Sister    Healthy Daughter  Healthy Daughter    Healthy Son      Review of Systems:   Please see the history of present illness.    All other systems reviewed and are negative.     EKGs/Labs/Other Test Reviewed:    EKG:  EKG performed January 2024 that I personally reviewed demonstrates sinus rhythm with PVCs;   Prior CV studies:  Cardiac Studies & Procedures     STRESS TESTS  MYOCARDIAL PERFUSION IMAGING 10/20/2018  Narrative  Nuclear stress EF: 54%.  The study is normal.  Low risk stress nuclear study with normal perfusion and normal left ventricular regional and global systolic function.    ECHOCARDIOGRAM  ECHOCARDIOGRAM COMPLETE 06/27/2022  Narrative ECHOCARDIOGRAM REPORT    Patient Name:   Jared Shea. Date of Exam: 06/27/2022 Medical Rec #:  [id number]       Height:       73.0 in Accession #:    [id number]      Weight:       175.7 lb Date of Birth:  1955-07-27       BSA:          2.036 m Patient Age:    67 years        BP:           133/76 mmHg Patient Gender: M               HR:           91 bpm. Exam Location:  Outpatient  Procedure: 2D Echo, 3D Echo, Cardiac Doppler, Color Doppler and Strain Analysis  Indications:    Abnormal ECG R94.31  History:        Patient has prior history of Echocardiogram examinations, most recent 10/20/2018. Arrythmias:PVC; Risk Factors:Hypertension, Diabetes and Former Smoker. Lung Cancer.  Sonographer:    Darlina Sicilian RDCS Referring Phys: Idamae Schuller  IMPRESSIONS   1. Left ventricular ejection fraction, by estimation, is 45 to 50%. Left ventricular ejection fraction by 3D volume is 45 %. The left ventricle has mildly decreased function. The left ventricle demonstrates global hypokinesis. Left ventricular diastolic parameters were normal. The average left ventricular global longitudinal strain is -11.2 %. The global longitudinal strain is abnormal. 2. Right ventricular systolic function is normal. The right ventricular size is normal. 3. Left atrial size was mildly dilated. 4. The mitral valve is normal in structure. Mild to moderate mitral valve regurgitation. No evidence of mitral stenosis. 5. The aortic valve has an indeterminant number of cusps. There is mild calcification of the aortic valve. There is mild thickening of the aortic valve. Aortic valve regurgitation is mild to moderate. No aortic stenosis is present. Aortic regurgitation PHT measures 335 msec. 6. The inferior vena cava is normal in size with greater than 50% respiratory variability, suggesting right atrial pressure of 3 mmHg.  Comparison(s): Changes from  prior study are noted.  Conclusion(s)/Recommendation(s): Frequent PVCs throughout study. Mildly reduced LVEF, abnormal strain with apical sparing, cannot exclude infiltrative process.  FINDINGS Left Ventricle: Left ventricular ejection fraction, by estimation, is 45 to 50%. Left ventricular ejection fraction by 3D volume is 45 %. The left ventricle has mildly decreased function. The left ventricle demonstrates global hypokinesis. The average left ventricular global longitudinal strain is -11.2 %. The global longitudinal strain is abnormal. The left ventricular internal cavity size was normal in size. There is no left ventricular hypertrophy. Left ventricular diastolic parameters were normal.  Right Ventricle: The right ventricular size is normal. No increase in  right ventricular wall thickness. Right ventricular systolic function is normal.  Left Atrium: Left atrial size was mildly dilated.  Right Atrium: Right atrial size was normal in size.  Pericardium: There is no evidence of pericardial effusion.  Mitral Valve: The mitral valve is normal in structure. There is mild thickening of the mitral valve leaflet(s). Mild to moderate mitral valve regurgitation. No evidence of mitral valve stenosis. MV peak gradient, 0.7 mmHg. The mean mitral valve gradient is 0.0 mmHg.  Tricuspid Valve: The tricuspid valve is grossly normal. Tricuspid valve regurgitation is trivial. No evidence of tricuspid stenosis.  Aortic Valve: The aortic valve has an indeterminant number of cusps. There is mild calcification of the aortic valve. There is mild thickening of the aortic valve. Aortic valve regurgitation is mild to moderate. Aortic regurgitation PHT measures 335 msec. No aortic stenosis is present.  Pulmonic Valve: The pulmonic valve was grossly normal. Pulmonic valve regurgitation is trivial. No evidence of pulmonic stenosis.  Aorta: The aortic root, ascending aorta, aortic arch and descending aorta are all  structurally normal, with no evidence of dilitation or obstruction.  Venous: The inferior vena cava is normal in size with greater than 50% respiratory variability, suggesting right atrial pressure of 3 mmHg.  IAS/Shunts: No atrial level shunt detected by color flow Doppler.   LEFT VENTRICLE PLAX 2D LVIDd:         5.40 cm         Diastology LVIDs:         4.40 cm         LV e' medial:    13.30 cm/s LV PW:         0.80 cm         LV E/e' medial:  5.2 LV IVS:        0.90 cm         LV e' lateral:   10.10 cm/s LVOT diam:     2.20 cm         LV E/e' lateral: 6.9 LV SV:         59 LV SV Index:   29              2D LVOT Area:     3.80 cm        Longitudinal Strain 2D Strain GLS  -12.1 % (A2C): 2D Strain GLS  -10.3 % (A3C): 2D Strain GLS  -11.2 % (A4C): 2D Strain GLS  -11.2 % Avg:  3D Volume EF LV 3D EF:    Left ventricul ar ejection fraction by 3D volume is 45 %.  3D Volume EF: 3D EF:        45 % LV EDV:       163 ml LV ESV:       89 ml LV SV:        74 ml  RIGHT VENTRICLE RV S prime:     11.50 cm/s TAPSE (M-mode): 2.0 cm  LEFT ATRIUM             Index        RIGHT ATRIUM           Index LA diam:        4.90 cm 2.41 cm/m   RA Area:     13.00 cm LA Vol (A2C):   58.4 ml 28.68 ml/m  RA Volume:   27.10 ml  13.31 ml/m LA Vol (A4C):   62.8 ml 30.84 ml/m LA Biplane Vol: 62.5 ml 30.69  ml/m AORTIC VALVE LVOT Vmax:   90.20 cm/s LVOT Vmean:  62.900 cm/s LVOT VTI:    0.156 m AI PHT:      335 msec  AORTA Ao Root diam: 3.00 cm Ao Asc diam:  2.90 cm  MITRAL VALVE MV Area (PHT): 5.16 cm       SHUNTS MV Area VTI:   9.42 cm       Systemic VTI:  0.16 m MV Peak grad:  0.7 mmHg       Systemic Diam: 2.20 cm MV Mean grad:  0.0 mmHg MV Vmax:       0.42 m/s MV Vmean:      29.0 cm/s MV Decel Time: 147 msec MR Peak grad:    121.4 mmHg MR Mean grad:    72.0 mmHg MR Vmax:         551.00 cm/s MR Vmean:        400.0 cm/s MR PISA:         0.57 cm MR PISA Eff ROA: 4  mm MR PISA Radius:  0.30 cm MV E velocity: 69.70 cm/s MV A velocity: 37.60 cm/s MV E/A ratio:  1.85  Buford Dresser MD Electronically signed by Buford Dresser MD Signature Date/Time: 06/27/2022/4:04:08 PM    Final             Other studies Reviewed: Review of the additional studies/records demonstrates: Aortic atherosclerosis with coronary artery calcifications on chest CT 2024  Recent Labs: 04/10/2022: ALT 19; BUN 22; Creatinine 0.73; Potassium 3.8; Sodium 138; TSH 1.065 05/22/2022: Hemoglobin 12.4; Platelet Count 308   Recent Lipid Panel No results found for: "CHOL", "TRIG", "HDL", "LDLCALC", "LDLDIRECT"  Risk Assessment/Calculations:      Physical Exam:      Signed, Early Osmond, MD  07/18/2022 9:55 AM    [facility] [address], [place], [place]  [address] Phone: [phone]; Fax: [phone]   Note:  This document was prepared using Dragon voice recognition software and may include unintentional dictation errors.

## 2022-07-19 NOTE — Progress Notes (Signed)
Cardiology Office Note:    Date:  07/20/2022   ID:  Jared Shea., DOB August 05, 1955, MRN [id number]  PCP:  [name], DO   [facility] Providers Cardiologist:  Lenna Sciara, MD Referring MD: [name], DO   Chief Complaint/Reason for Referral:  PVCs and cardiomyopathy   ASSESSMENT:    1. Cardiomyopathy, unspecified type ([name])   2. Type 2 diabetes mellitus without complication, without long-term current use of insulin ([name])   3. Hypertension associated with diabetes ([name])   4. Hyperlipidemia associated with type 2 diabetes mellitus ([name])   5. Aortic atherosclerosis ([name])   6. Coronary artery calcification seen on CAT scan   7. PVC (premature ventricular contraction)   8. Precordial pain     PLAN:    In order of problems listed above: 1.  Mild cardiomyopathy: This may be due to his PVC burden or his history of chemotherapy as an echocardiogram in 2020 showed normal function.  Will transition to Toprol-XL, start Jardiance, and continue losartan.  Will check echocardiogram in 3 months. He does have three-vessel coronary artery calcification so this could represent balanced ischemia however.  He is relatively young and does have metastatic non-small cell lung cancer which will need to be factored into decision making. 2.  Type 2 diabetes: Start aspirin 81 mg daily, continue losartan, start atorvastatin 20 mg daily, and start Jardiance 10 mg daily. 3.  Hypertension: Blood pressure is relatively low however the patient is not having any presyncopal symptoms.  Will change losartan to bedtime dosing and Toprol to bedtime dosing 4.  Hyperlipidemia: Will check lipid panel, LFTs, and LP(a) in 2 months after starting atorvastatin.  Goal LDL for diabetic is less than 70. 5.  Aortic atherosclerosis: Start aspirin and atorvastatin, and pursue strict blood pressure control. 6.  Coronary artery calcification: See discussion above. 7.  PVCs: Start Toprol as above. 8.  Chest pain: We  will obtain a coronary CTA and echocardiogram to evaluate further.  If the patient has mild obstructive coronary artery disease, they will require a statin (with goal LDL < 70) and aspirin, if they have high-grade disease we will need to consider optimal medical therapy and if symptoms are refractory to medical therapy, then a cardiac catheterization with possible PCI will be pursued to alleviate symptoms.  If they have high risk disease we will proceed directly to cardiac catheterization.  9.  Dyspnea:  Will obtain PFTs given smoking history and coronary CTA as above        Dispo:  Return in about 6 months (around 01/20/2023).      Medication Adjustments/Labs and Tests Ordered: Current medicines are reviewed at length with the patient today.  Concerns regarding medicines are outlined above.  The following changes have been made:     Labs/tests ordered: Orders Placed This Encounter  Procedures   CT CORONARY MORPH W/CTA COR W/SCORE W/CA W/CM &/OR WO/CM   Lipid panel   Hepatic function panel   Lipoprotein A (LPA)   ECHOCARDIOGRAM COMPLETE   Pulmonary function test    Medication Changes: Meds ordered this encounter  Medications   ivabradine (CORLANOR) 7.5 MG TABS tablet    Sig: Take 2 tablets (15 mg total) by mouth once for 1 dose. Take 90-120 minutes prior to scan.    Dispense:  2 tablet    Refill:  0    [facility] Please for procedure   empagliflozin (JARDIANCE) 10 MG TABS tablet    Sig: Take 1 tablet (10  mg total) by mouth daily before breakfast.    Dispense:  90 tablet    Refill:  3   metoprolol succinate (TOPROL XL) 25 MG 24 hr tablet    Sig: Take 1 tablet (25 mg total) by mouth at bedtime.    Dispense:  90 tablet    Refill:  3   atorvastatin (LIPITOR) 20 MG tablet    Sig: Take 1 tablet (20 mg total) by mouth daily.    Dispense:  90 tablet    Refill:  3   aspirin EC 81 MG tablet    Sig: Take 1 tablet (81 mg total) by mouth daily. Swallow whole.    Dispense:  90 tablet     Refill:  3     Current medicines are reviewed at length with the patient today.  The patient does not have concerns regarding medicines.   History of Present Illness:    FOCUSED PROBLEM LIST:   1.  Type 2 diabetes on metformin 2.  Hypertension 3.  Aortic atherosclerosis with coronary artery calcifications on chest CT 2024 4.  Non-small cell lung cancer stage IV metastatic to the brain followed by oncology; patient was treated with carboplatin, Taxol, palonosetron and XRT in 2021; s/p cemiplimab immunotherapy   The patient is a 67 y.o. male with the indicated medical history here for recommendations regarding PVCs and incidentally noted mild cardiomyopathy.  The patient tells me that his cancer is relatively stable and he is done with immunotherapy.  He tells me he gets short of breath at times but is this does not seem to be a routine occurrence.  He is not short of breath at rest.  He denies any orthopnea, paroxysmal nocturnal dyspnea, or peripheral edema.  He occasionally gets chest tightness when he exerts himself however.  This does seem to improve with rest.  He denies any presyncope, syncope, or palpitations.  He has not required any emergency room visits or hospitalizations recently.       Current Medications: Current Meds  Medication Sig   albuterol (VENTOLIN HFA) 108 (90 Base) MCG/ACT inhaler Inhale 2 puffs into the lungs every 6 (six) hours as needed for wheezing or shortness of breath.   aspirin EC 81 MG tablet Take 1 tablet (81 mg total) by mouth daily. Swallow whole.   atorvastatin (LIPITOR) 20 MG tablet Take 1 tablet (20 mg total) by mouth daily.   blood glucose meter kit and supplies KIT Dispense based on patient and insurance preference. Use up to three times daily as directed.   Continuous Blood Gluc Receiver ([place]) DEVI Please use as directed to check your blood sugar.   Continuous Blood Gluc Sensor (DEXCOM G7 SENSOR) MISC Please use as directed. Place  on skin for glucose monitoring. Change sensors every 10 days.   empagliflozin (JARDIANCE) 10 MG TABS tablet Take 1 tablet (10 mg total) by mouth daily before breakfast.   glucose blood (ONETOUCH VERIO) test strip TEST 3 TIMES DAILY   ivabradine (CORLANOR) 7.5 MG TABS tablet Take 2 tablets (15 mg total) by mouth once for 1 dose. Take 90-120 minutes prior to scan.   Lancets (ONETOUCH DELICA PLUS Q000111Q) MISC 3 (three) times daily. as directed   levETIRAcetam (KEPPRA) 750 MG tablet Take 1 tablet (750 mg total) by mouth 2 (two) times daily.   losartan (COZAAR) 25 MG tablet TAKE 1 TABLET (25 MG TOTAL) DAILY.   metFORMIN (GLUCOPHAGE) 1000 MG tablet Take 1 tablet (1,000 mg total) by mouth  2 (two) times daily with a meal.   metoprolol succinate (TOPROL XL) 25 MG 24 hr tablet Take 1 tablet (25 mg total) by mouth at bedtime.   nortriptyline (PAMELOR) 25 MG capsule TAKE 2 CAPSULES(50 MG) BY MOUTH AT BEDTIME   [DISCONTINUED] celecoxib (CELEBREX) 200 MG capsule Take 1 capsule (200 mg total) by mouth 2 (two) times daily.   [DISCONTINUED] HYDROcodone-acetaminophen (NORCO) 5-325 MG tablet Take 1 tablet by mouth every 6 (six) hours as needed for moderate pain.   [DISCONTINUED] metoprolol tartrate (LOPRESSOR) 25 MG tablet Take 1 tablet (25 mg total) by mouth 2 (two) times daily.   [DISCONTINUED] sildenafil (VIAGRA) 50 MG tablet Take 2 tablets (100 mg total) by mouth daily as needed for erectile dysfunction.     Allergies:    Penicillins   Social History:   Social History   Tobacco Use   Smoking status: Former    Packs/day: 1.00    Years: 48.00    Total pack years: 48.00    Types: Cigarettes    Start date: 08/07/1970    Quit date: 12/15/2019    Years since quitting: 2.5   Smokeless tobacco: Never  Vaping Use   Vaping Use: Never used  Substance Use Topics   Alcohol use: Not Currently   Drug use: Never     Family Hx: Family History  Problem Relation Age of Onset   Heart disease Mother         CABG   Kidney disease Sister    Diabetes Brother    Kidney disease Brother        KIDNEY TRANSPLANT   Hypertension Sister    Healthy Daughter    Healthy Daughter    Healthy Son      Review of Systems:   Please see the history of present illness.    All other systems reviewed and are negative.     EKGs/Labs/Other Test Reviewed:    EKG:  EKG performed January 2024 that I personally reviewed demonstrates sinus rhythm with PVCs  Prior CV studies:  Cardiac Studies & Procedures     STRESS TESTS  MYOCARDIAL PERFUSION IMAGING 10/20/2018  Narrative  Nuclear stress EF: 54%.  The study is normal.  Low risk stress nuclear study with normal perfusion and normal left ventricular regional and global systolic function.   ECHOCARDIOGRAM  ECHOCARDIOGRAM COMPLETE 06/27/2022  Narrative ECHOCARDIOGRAM REPORT    Patient Name:   Jared Shea. Date of Exam: 06/27/2022 Medical Rec #:  [id number]       Height:       73.0 in Accession #:    [id number]      Weight:       175.7 lb Date of Birth:  [birth year]       BSA:          2.036 m Patient Age:    48 years        BP:           133/76 mmHg Patient Gender: M               HR:           91 bpm. Exam Location:  Outpatient  Procedure: 2D Echo, 3D Echo, Cardiac Doppler, Color Doppler and Strain Analysis  Indications:    Abnormal ECG R94.31  History:        Patient has prior history of Echocardiogram examinations, most recent 10/20/2018. Arrythmias:PVC; Risk Factors:Hypertension, Diabetes and Former Smoker. Lung Cancer.  Sonographer:  Darlina Sicilian RDCS Referring Phys: Idamae Schuller  IMPRESSIONS   1. Left ventricular ejection fraction, by estimation, is 45 to 50%. Left ventricular ejection fraction by 3D volume is 45 %. The left ventricle has mildly decreased function. The left ventricle demonstrates global hypokinesis. Left ventricular diastolic parameters were normal. The average left ventricular global longitudinal strain is -11.2  %. The global longitudinal strain is abnormal. 2. Right ventricular systolic function is normal. The right ventricular size is normal. 3. Left atrial size was mildly dilated. 4. The mitral valve is normal in structure. Mild to moderate mitral valve regurgitation. No evidence of mitral stenosis. 5. The aortic valve has an indeterminant number of cusps. There is mild calcification of the aortic valve. There is mild thickening of the aortic valve. Aortic valve regurgitation is mild to moderate. No aortic stenosis is present. Aortic regurgitation PHT measures 335 msec. 6. The inferior vena cava is normal in size with greater than 50% respiratory variability, suggesting right atrial pressure of 3 mmHg.  Comparison(s): Changes from prior study are noted.  Conclusion(s)/Recommendation(s): Frequent PVCs throughout study. Mildly reduced LVEF, abnormal strain with apical sparing, cannot exclude infiltrative process.  FINDINGS Left Ventricle: Left ventricular ejection fraction, by estimation, is 45 to 50%. Left ventricular ejection fraction by 3D volume is 45 %. The left ventricle has mildly decreased function. The left ventricle demonstrates global hypokinesis. The average left ventricular global longitudinal strain is -11.2 %. The global longitudinal strain is abnormal. The left ventricular internal cavity size was normal in size. There is no left ventricular hypertrophy. Left ventricular diastolic parameters were normal.  Right Ventricle: The right ventricular size is normal. No increase in right ventricular wall thickness. Right ventricular systolic function is normal.  Left Atrium: Left atrial size was mildly dilated.  Right Atrium: Right atrial size was normal in size.  Pericardium: There is no evidence of pericardial effusion.  Mitral Valve: The mitral valve is normal in structure. There is mild thickening of the mitral valve leaflet(s). Mild to moderate mitral valve regurgitation. No evidence of  mitral valve stenosis. MV peak gradient, 0.7 mmHg. The mean mitral valve gradient is 0.0 mmHg.  Tricuspid Valve: The tricuspid valve is grossly normal. Tricuspid valve regurgitation is trivial. No evidence of tricuspid stenosis.  Aortic Valve: The aortic valve has an indeterminant number of cusps. There is mild calcification of the aortic valve. There is mild thickening of the aortic valve. Aortic valve regurgitation is mild to moderate. Aortic regurgitation PHT measures 335 msec. No aortic stenosis is present.  Pulmonic Valve: The pulmonic valve was grossly normal. Pulmonic valve regurgitation is trivial. No evidence of pulmonic stenosis.  Aorta: The aortic root, ascending aorta, aortic arch and descending aorta are all structurally normal, with no evidence of dilitation or obstruction.  Venous: The inferior vena cava is normal in size with greater than 50% respiratory variability, suggesting right atrial pressure of 3 mmHg.  IAS/Shunts: No atrial level shunt detected by color flow Doppler.   LEFT VENTRICLE PLAX 2D LVIDd:         5.40 cm         Diastology LVIDs:         4.40 cm         LV e' medial:    13.30 cm/s LV PW:         0.80 cm         LV E/e' medial:  5.2 LV IVS:        0.90 cm  LV e' lateral:   10.10 cm/s LVOT diam:     2.20 cm         LV E/e' lateral: 6.9 LV SV:         59 LV SV Index:   29              2D LVOT Area:     3.80 cm        Longitudinal Strain 2D Strain GLS  -12.1 % (A2C): 2D Strain GLS  -10.3 % (A3C): 2D Strain GLS  -11.2 % (A4C): 2D Strain GLS  -11.2 % Avg:  3D Volume EF LV 3D EF:    Left ventricul ar ejection fraction by 3D volume is 45 %.  3D Volume EF: 3D EF:        45 % LV EDV:       163 ml LV ESV:       89 ml LV SV:        74 ml  RIGHT VENTRICLE RV S prime:     11.50 cm/s TAPSE (M-mode): 2.0 cm  LEFT ATRIUM             Index        RIGHT ATRIUM           Index LA diam:        4.90 cm 2.41 cm/m   RA Area:     13.00  cm LA Vol (A2C):   58.4 ml 28.68 ml/m  RA Volume:   27.10 ml  13.31 ml/m LA Vol (A4C):   62.8 ml 30.84 ml/m LA Biplane Vol: 62.5 ml 30.69 ml/m AORTIC VALVE LVOT Vmax:   90.20 cm/s LVOT Vmean:  62.900 cm/s LVOT VTI:    0.156 m AI PHT:      335 msec  AORTA Ao Root diam: 3.00 cm Ao Asc diam:  2.90 cm  MITRAL VALVE MV Area (PHT): 5.16 cm       SHUNTS MV Area VTI:   9.42 cm       Systemic VTI:  0.16 m MV Peak grad:  0.7 mmHg       Systemic Diam: 2.20 cm MV Mean grad:  0.0 mmHg MV Vmax:       0.42 m/s MV Vmean:      29.0 cm/s MV Decel Time: 147 msec MR Peak grad:    121.4 mmHg MR Mean grad:    72.0 mmHg MR Vmax:         551.00 cm/s MR Vmean:        400.0 cm/s MR PISA:         0.57 cm MR PISA Eff ROA: 4 mm MR PISA Radius:  0.30 cm MV E velocity: 69.70 cm/s MV A velocity: 37.60 cm/s MV E/A ratio:  1.85  Buford Dresser MD Electronically signed by Buford Dresser MD Signature Date/Time: 06/27/2022/4:04:08 PM    Final             Other studies Reviewed: Review of the additional studies/records demonstrates: Aortic atherosclerosis with coronary artery calcifications on chest CT 2024   Recent Labs: 04/10/2022: ALT 19; BUN 22; Creatinine 0.73; Potassium 3.8; Sodium 138; TSH 1.065 05/22/2022: Hemoglobin 12.4; Platelet Count 308   Recent Lipid Panel No results found for: "CHOL", "TRIG", "HDL", "LDLCALC", "LDLDIRECT"  Risk Assessment/Calculations:                Physical Exam:    VS:  BP 98/68   Pulse 80   Ht  $'6\' 1"'j$  (1.854 m)   Wt 176 lb 12.8 oz (80.2 kg)   SpO2 98%   BMI 23.33 kg/m    Wt Readings from Last 3 Encounters:  07/20/22 176 lb 12.8 oz (80.2 kg)  06/20/22 175 lb 11.2 oz (79.7 kg)  05/22/22 180 lb 4.8 oz (81.8 kg)    GENERAL:  No apparent distress, AOx3 HEENT:  No carotid bruits, +2 carotid impulses, no scleral icterus CAR: RRR no murmurs, gallops, rubs, or thrills RES:  Clear to auscultation bilaterally ABD:  Soft, nontender,  nondistended, positive bowel sounds x 4 VASC:  +2 radial pulses, +2 carotid pulses, palpable pedal pulses NEURO:  CN 2-12 grossly intact; motor and sensory grossly intact PSYCH:  No active depression or anxiety EXT:  No edema, ecchymosis, or cyanosis  Signed, Early Osmond, MD  07/20/2022 8:58 AM    [facility] [place], [place], [place]  [address] Phone: [phone]; Fax: [phone]   Note:  This document was prepared using Dragon voice recognition software and may include unintentional dictation errors.

## 2022-07-20 ENCOUNTER — Ambulatory Visit: Payer: Medicare HMO | Attending: Internal Medicine | Admitting: Internal Medicine

## 2022-07-20 ENCOUNTER — Other Ambulatory Visit (HOSPITAL_COMMUNITY): Payer: Self-pay

## 2022-07-20 ENCOUNTER — Encounter: Payer: Self-pay | Admitting: Internal Medicine

## 2022-07-20 VITALS — BP 98/68 | HR 80 | Ht 73.0 in | Wt 176.8 lb

## 2022-07-20 DIAGNOSIS — E1159 Type 2 diabetes mellitus with other circulatory complications: Secondary | ICD-10-CM

## 2022-07-20 DIAGNOSIS — E119 Type 2 diabetes mellitus without complications: Secondary | ICD-10-CM | POA: Diagnosis not present

## 2022-07-20 DIAGNOSIS — R072 Precordial pain: Secondary | ICD-10-CM | POA: Diagnosis not present

## 2022-07-20 DIAGNOSIS — I493 Ventricular premature depolarization: Secondary | ICD-10-CM

## 2022-07-20 DIAGNOSIS — I2510 Atherosclerotic heart disease of native coronary artery without angina pectoris: Secondary | ICD-10-CM

## 2022-07-20 DIAGNOSIS — I700 Atherosclerosis of aorta: Secondary | ICD-10-CM | POA: Diagnosis not present

## 2022-07-20 DIAGNOSIS — I152 Hypertension secondary to endocrine disorders: Secondary | ICD-10-CM | POA: Diagnosis not present

## 2022-07-20 DIAGNOSIS — E785 Hyperlipidemia, unspecified: Secondary | ICD-10-CM

## 2022-07-20 DIAGNOSIS — E1169 Type 2 diabetes mellitus with other specified complication: Secondary | ICD-10-CM | POA: Diagnosis not present

## 2022-07-20 DIAGNOSIS — I429 Cardiomyopathy, unspecified: Secondary | ICD-10-CM

## 2022-07-20 MED ORDER — ATORVASTATIN CALCIUM 20 MG PO TABS: 20.0000 mg | ORAL_TABLET | Freq: Every day | ORAL | 3 refills | Status: DC

## 2022-07-20 MED ORDER — EMPAGLIFLOZIN 10 MG PO TABS: 10.0000 mg | ORAL_TABLET | Freq: Every day | ORAL | 3 refills | Status: DC

## 2022-07-20 MED ORDER — METOPROLOL SUCCINATE ER 25 MG PO TB24: 25.0000 mg | ORAL_TABLET | Freq: Every evening | ORAL | 3 refills | Status: DC

## 2022-07-20 MED ORDER — IVABRADINE HCL 7.5 MG PO TABS
15.0000 mg | ORAL_TABLET | Freq: Once | ORAL | 0 refills | Status: AC
  Filled 2022-07-20: qty 2, 1d supply, fill #0

## 2022-07-20 MED ORDER — ASPIRIN 81 MG PO TBEC: 81.0000 mg | DELAYED_RELEASE_TABLET | Freq: Every day | ORAL | 3 refills | Status: DC

## 2022-07-20 NOTE — Patient Instructions (Addendum)
Medication Instructions:  Your physician has recommended you make the following change in your medication:  1-STOP Metoprolol tartrate 2-START Jardiance 10 mg by mouth daily 3-START Metoprolol succinate 25 mg by mouth at bedtime 4-START Aspirin 81 mg by mouth daily 5-START Atorvastatin 20 mg by mouth daily 6-TAKE Losartan by mouth at bedtime   *If you need a refill on your cardiac medications before your next appointment, please call your pharmacy  Lab Work: Your physician recommends that you return for lab work in: 2 months for lipid panel, LFTs and LP (a)  If you have labs (blood work) drawn today and your tests are completely normal, you will receive your results only by: [place] (if you have MyChart) OR A paper copy in the mail If you have any lab test that is abnormal or we need to change your treatment, we will call you to review the results.  Testing/Procedures: Your physician has requested that you have an echocardiogram in 3 months. Echocardiography is a painless test that uses sound waves to create images of your heart. It provides your doctor with information about the size and shape of your heart and how well your heart's chambers and valves are working. This procedure takes approximately one hour. There are no restrictions for this procedure. Please do NOT wear cologne, perfume, aftershave, or lotions (deodorant is allowed). Please arrive 15 minutes prior to your appointment time.  Your physician has requested that you have cardiac CT. Cardiac computed tomography (CT) is a painless test that uses an x-ray machine to take clear, detailed pictures of your heart. For further information please visit [email or web address]. Please follow instruction sheet as given.  Follow-Up: At [facility], you and your health needs are our priority.  As part of our continuing mission to provide you with exceptional heart care, we have created designated Provider Care Teams.   These Care Teams include your primary Cardiologist (physician) and Advanced Practice Providers (APPs -  Physician Assistants and Nurse Practitioners) who all work together to provide you with the care you need, when you need it.  We recommend signing up for the patient portal called "MyChart".  Sign up information is provided on this After Visit Summary.  MyChart is used to connect with patients for Virtual Visits (Telemedicine).  Patients are able to view lab/test results, encounter notes, upcoming appointments, etc.  Non-urgent messages can be sent to your provider as well.   To learn more about what you can do with MyChart, go to [email or web address].    Your next appointment:   6 month(s)  Provider:   Early Osmond, MD     Other Instructions   Your cardiac CT will be scheduled at one of the below locations:   [facility] [address] [place], [place] [address] [phone]  If scheduled at [facility], please arrive at the [facility] Entrance (Entrance C2) of [facility] 30 minutes prior to test start time. You can use the FREE valet parking offered at entrance C (encouraged to control the heart rate for the test)  Proceed to the [facility] Radiology Department (first floor) to check-in and test prep.  All radiology patients and guests should use entrance C2 at [facility], accessed from [facility], even though the hospital's physical address listed is [address].    If scheduled at [facility] or [facility], please arrive 15 mins early for check-in and  test prep.   Please follow these instructions carefully (unless otherwise directed):  Hold all erectile dysfunction medications at least 3 days (72 hrs) prior to test. (Ie viagra, cialis, sildenafil, tadalafil, etc) We will administer nitroglycerin during this exam.   On the Night Before the  Test: Be sure to Drink plenty of water. Do not consume any caffeinated/decaffeinated beverages or chocolate 12 hours prior to your test. Do not take any antihistamines 12 hours prior to your test.  On the Day of the Test: Drink plenty of water until 1 hour prior to the test. Do not eat any food 1 hour prior to test. You may take your regular medications prior to the test.  Take Ivabradine (cordandor) 15 mg (2 tablets) two hours prior to test. Please pick this medication up at [facility].  After the Test: Drink plenty of water. After receiving IV contrast, you may experience a mild flushed feeling. This is normal. On occasion, you may experience a mild rash up to 24 hours after the test. This is not dangerous. If this occurs, you can take Benadryl 25 mg and increase your fluid intake. If you experience trouble breathing, this can be serious. If it is severe call 911 IMMEDIATELY. If it is mild, please call our office. If you take any of these medications: Metformin please hold for 48 hours after your procedure We will call to schedule your test 2-4 weeks out understanding that some insurance companies will need an authorization prior to the service being performed.   For non-scheduling related questions, please contact the cardiac imaging nurse navigator should you have any questions/concerns: Marchia Bond, Cardiac Imaging Nurse Navigator Gordy Clement, Cardiac Imaging Nurse Navigator [facility] Direct Office Dial: [phone]   For scheduling needs, including cancellations and rescheduling, please call [place], [phone].

## 2022-07-23 ENCOUNTER — Encounter: Payer: Self-pay | Admitting: Dietician

## 2022-07-24 ENCOUNTER — Ambulatory Visit: Payer: Medicare HMO | Attending: Internal Medicine

## 2022-07-24 DIAGNOSIS — E1159 Type 2 diabetes mellitus with other circulatory complications: Secondary | ICD-10-CM

## 2022-07-24 DIAGNOSIS — I493 Ventricular premature depolarization: Secondary | ICD-10-CM | POA: Diagnosis not present

## 2022-07-24 DIAGNOSIS — E1169 Type 2 diabetes mellitus with other specified complication: Secondary | ICD-10-CM | POA: Diagnosis not present

## 2022-07-24 DIAGNOSIS — I700 Atherosclerosis of aorta: Secondary | ICD-10-CM

## 2022-07-24 DIAGNOSIS — I152 Hypertension secondary to endocrine disorders: Secondary | ICD-10-CM

## 2022-07-24 DIAGNOSIS — R072 Precordial pain: Secondary | ICD-10-CM

## 2022-07-24 DIAGNOSIS — I429 Cardiomyopathy, unspecified: Secondary | ICD-10-CM

## 2022-07-24 DIAGNOSIS — E785 Hyperlipidemia, unspecified: Secondary | ICD-10-CM | POA: Diagnosis not present

## 2022-07-24 DIAGNOSIS — I2510 Atherosclerotic heart disease of native coronary artery without angina pectoris: Secondary | ICD-10-CM

## 2022-07-24 DIAGNOSIS — E119 Type 2 diabetes mellitus without complications: Secondary | ICD-10-CM | POA: Diagnosis not present

## 2022-07-25 ENCOUNTER — Other Ambulatory Visit (HOSPITAL_COMMUNITY): Payer: Self-pay | Admitting: Emergency Medicine

## 2022-07-25 DIAGNOSIS — E119 Type 2 diabetes mellitus without complications: Secondary | ICD-10-CM

## 2022-07-25 LAB — HEPATIC FUNCTION PANEL
ALT: 29 IU/L (ref 0–44)
AST: 25 IU/L (ref 0–40)
Albumin: 4.6 g/dL (ref 3.9–4.9)
Alkaline Phosphatase: 109 IU/L (ref 44–121)
Bilirubin Total: 0.8 mg/dL (ref 0.0–1.2)
Bilirubin, Direct: 0.25 mg/dL (ref 0.00–0.40)
Total Protein: 7.4 g/dL (ref 6.0–8.5)

## 2022-07-25 LAB — LIPID PANEL
Chol/HDL Ratio: 2.3 ratio (ref 0.0–5.0)
Cholesterol, Total: 69 mg/dL — ABNORMAL LOW (ref 100–199)
HDL: 30 mg/dL — ABNORMAL LOW (ref >39)
LDL Chol Calc (NIH): 23 mg/dL (ref 0–99)
Triglycerides: 75 mg/dL (ref 0–149)
VLDL Cholesterol Cal: 16 mg/dL (ref 5–40)

## 2022-07-25 LAB — LIPOPROTEIN A (LPA): Lipoprotein (a): <8.4 nmol/L (ref <75.0)

## 2022-07-26 ENCOUNTER — Telehealth (HOSPITAL_COMMUNITY): Payer: Self-pay | Admitting: Emergency Medicine

## 2022-07-26 ENCOUNTER — Encounter: Payer: Self-pay | Admitting: Internal Medicine

## 2022-07-26 NOTE — Telephone Encounter (Signed)
Attempted to call patient regarding upcoming cardiac CT appointment. °Left message on voicemail with name and callback number °Helmut Hennon RN Navigator Cardiac Imaging °[facility] °[phone] Office °[phone] Cell ° °

## 2022-07-27 ENCOUNTER — Telehealth (HOSPITAL_COMMUNITY): Payer: Self-pay | Admitting: Emergency Medicine

## 2022-07-27 ENCOUNTER — Ambulatory Visit (HOSPITAL_BASED_OUTPATIENT_CLINIC_OR_DEPARTMENT_OTHER)
Admission: RE | Admit: 2022-07-27 | Discharge: 2022-07-27 | Disposition: A | Discharge status: Home / Self Care | Payer: Medicare HMO | Source: Ambulatory Visit | Attending: Internal Medicine | Admitting: Internal Medicine

## 2022-07-27 ENCOUNTER — Other Ambulatory Visit (HOSPITAL_COMMUNITY): Payer: Self-pay

## 2022-07-27 ENCOUNTER — Ambulatory Visit (HOSPITAL_COMMUNITY)
Admission: RE | Admit: 2022-07-27 | Discharge: 2022-07-27 | Disposition: A | Discharge status: Home / Self Care | Payer: Medicare HMO | Source: Ambulatory Visit | Attending: Internal Medicine | Admitting: Internal Medicine

## 2022-07-27 DIAGNOSIS — R072 Precordial pain: Secondary | ICD-10-CM | POA: Insufficient documentation

## 2022-07-27 DIAGNOSIS — R931 Abnormal findings on diagnostic imaging of heart and coronary circulation: Secondary | ICD-10-CM | POA: Insufficient documentation

## 2022-07-27 DIAGNOSIS — I700 Atherosclerosis of aorta: Secondary | ICD-10-CM | POA: Insufficient documentation

## 2022-07-27 DIAGNOSIS — E119 Type 2 diabetes mellitus without complications: Secondary | ICD-10-CM | POA: Insufficient documentation

## 2022-07-27 DIAGNOSIS — E1169 Type 2 diabetes mellitus with other specified complication: Secondary | ICD-10-CM | POA: Insufficient documentation

## 2022-07-27 DIAGNOSIS — I2510 Atherosclerotic heart disease of native coronary artery without angina pectoris: Secondary | ICD-10-CM | POA: Diagnosis not present

## 2022-07-27 DIAGNOSIS — E1159 Type 2 diabetes mellitus with other circulatory complications: Secondary | ICD-10-CM | POA: Diagnosis not present

## 2022-07-27 DIAGNOSIS — I493 Ventricular premature depolarization: Secondary | ICD-10-CM | POA: Diagnosis not present

## 2022-07-27 DIAGNOSIS — E785 Hyperlipidemia, unspecified: Secondary | ICD-10-CM | POA: Diagnosis not present

## 2022-07-27 DIAGNOSIS — I429 Cardiomyopathy, unspecified: Secondary | ICD-10-CM | POA: Diagnosis not present

## 2022-07-27 DIAGNOSIS — I152 Hypertension secondary to endocrine disorders: Secondary | ICD-10-CM | POA: Diagnosis not present

## 2022-07-27 MED ORDER — IOHEXOL 350 MG/ML SOLN
100.0000 mL | Freq: Once | INTRAVENOUS | Status: AC | PRN
  Administered 2022-07-27: 100 mL via INTRAVENOUS

## 2022-07-27 MED ORDER — METOPROLOL TARTRATE 5 MG/5ML IV SOLN
INTRAVENOUS | Status: AC
  Filled 2022-07-27: qty 10

## 2022-07-27 MED ORDER — NITROGLYCERIN 0.4 MG SL SUBL
SUBLINGUAL_TABLET | SUBLINGUAL | Status: AC
  Filled 2022-07-27: qty 2

## 2022-07-27 MED ORDER — METOPROLOL TARTRATE 5 MG/5ML IV SOLN
10.0000 mg | Freq: Once | INTRAVENOUS | Status: AC
  Administered 2022-07-27: 10 mg via INTRAVENOUS

## 2022-07-27 MED ORDER — NITROGLYCERIN 0.4 MG SL SUBL
0.8000 mg | SUBLINGUAL_TABLET | Freq: Once | SUBLINGUAL | Status: AC
  Administered 2022-07-27: 0.8 mg via SUBLINGUAL

## 2022-07-27 NOTE — Telephone Encounter (Signed)
Reaching out to patient to offer assistance regarding upcoming cardiac imaging study; pt verbalizes understanding of appt date/time, parking situation and where to check in, pre-test NPO status and medications ordered, and verified current allergies; name and call back number provided for further questions should they arise Marchia Bond RN [facility] [phone] office [phone] cell  Arrival [address] entrance  Port  15mg  ivabradine Aware contrast/nitro Spoke with sister (Mrs [place])

## 2022-07-30 ENCOUNTER — Telehealth: Payer: Self-pay | Admitting: Internal Medicine

## 2022-07-30 ENCOUNTER — Telehealth: Payer: Self-pay | Admitting: *Deleted

## 2022-07-30 ENCOUNTER — Encounter: Payer: Self-pay | Admitting: Internal Medicine

## 2022-07-30 NOTE — Telephone Encounter (Signed)
Error

## 2022-07-30 NOTE — Telephone Encounter (Signed)
Pt c/o medication issue:  1. Name of Medication:   empagliflozin (JARDIANCE) 10 MG TABS tablet    2. How are you currently taking this medication (dosage and times per day)? As written  3. Are you having a reaction (difficulty breathing--STAT)? no  4. What is your medication issue?  Pt sister states pt called her this morning stating he has had diarrhea since starting this medication. She said that's the only symptoms he has complained of. She ask that someone call the pt back, please

## 2022-07-30 NOTE — Telephone Encounter (Signed)
Return pt's call - no answer; left message of office's return call on home vm; also called cell phone - no answer; mailbox full, unable to leave a message.

## 2022-07-30 NOTE — Telephone Encounter (Signed)
Placed blank DPR form in outgoing mail to patient for completion to include his sister Arlene.

## 2022-07-30 NOTE — Telephone Encounter (Signed)
Per AVS from 07/20/22:  Medication Instructions:  Your physician has recommended you make the following change in your medication:  1-STOP Metoprolol tartrate 2-START Jardiance 10 mg by mouth daily 3-START Metoprolol succinate 25 mg by mouth at bedtime 4-START Aspirin 81 mg by mouth daily 5-START Atorvastatin 20 mg by mouth daily 6-TAKE Losartan by mouth at bedtime   Will route to PharmD since patient was started on 4 new medications at last office visit on 07/20/22. Will call patient back after PharmD review.

## 2022-07-30 NOTE — Telephone Encounter (Signed)
The patient's sister called back.  Hers is the primary phone number on his chart.  She asked about pt holding Jardiance to see if diarrhea improves.  She asked what will happen to his blood sugar while he is holding Jardiance --"since you discontinued his metformin".  I adv that he is still to be taking metformin and cardiology did not stop this.  Education provided on Jardiance 10 mg use.  She will ensure he restarts metformin.    Karilyn Cota that the patient asked me to call her back next week to get an update on his symptoms.

## 2022-07-30 NOTE — Telephone Encounter (Signed)
Patient called stating the new medicine is not agreeing with him.  He is having diarrhea. Did not know the name just said the new medicine. Please call him.

## 2022-07-30 NOTE — Telephone Encounter (Signed)
Ok to [employer] and see if diarrhea improves.

## 2022-07-30 NOTE — Telephone Encounter (Addendum)
Called the patient back.  He will stop Jardiance for now to see if diarrhea improves. Per patient request I will call his sister, Ethelle Lyon back next week for an update on symptoms. Hers is the primary contact phone number but I do not see her on his DPR.

## 2022-08-03 ENCOUNTER — Encounter: Payer: Self-pay | Admitting: Internal Medicine

## 2022-08-03 LAB — BASIC METABOLIC PANEL
BUN/Creatinine Ratio: 11 (ref 10–24)
BUN: 8 mg/dL (ref 8–27)
CO2: 16 mmol/L — ABNORMAL LOW (ref 20–29)
Calcium: 9.1 mg/dL (ref 8.6–10.2)
Chloride: 106 mmol/L (ref 96–106)
Creatinine, Ser: 0.74 mg/dL — ABNORMAL LOW (ref 0.76–1.27)
Glucose: 120 mg/dL — ABNORMAL HIGH (ref 70–99)
Potassium: 3.9 mmol/L (ref 3.5–5.2)
Sodium: 143 mmol/L (ref 134–144)
eGFR: 99 mL/min/{1.73_m2} (ref >59)

## 2022-08-03 LAB — SPECIMEN STATUS REPORT

## 2022-08-07 ENCOUNTER — Telehealth: Payer: Self-pay | Admitting: Internal Medicine

## 2022-08-07 MED ORDER — ISOSORBIDE MONONITRATE ER 30 MG PO TB24: 30.0000 mg | ORAL_TABLET | Freq: Every day | ORAL | 3 refills | Status: DC

## 2022-08-07 NOTE — Telephone Encounter (Signed)
-----   Message from Early Osmond, MD sent at 07/31/2022  9:58 AM EDT ----- Arloa Koh start Imdur 30 mg at bedtime and have him come back and see me at some point in time in the next month; you can add him onto it the end of a quarter day.

## 2022-08-07 NOTE — Telephone Encounter (Signed)
Sister called to follow-up on patient's test results.

## 2022-08-07 NOTE — Telephone Encounter (Signed)
Called patient to get verbal permission to speak w his sister, Ethelle Lyon.  Permission granted.  Called and spoke w Arlene.  Reviewed results of cardiac CT and recommendation.  IMDUR 30 mg sent to pharmacy and follow up appointment scheduled.  Adv if chest tightness continues or worsens that they should contact office for further recommendations.

## 2022-08-11 ENCOUNTER — Other Ambulatory Visit: Payer: Self-pay | Admitting: Neurology

## 2022-08-14 ENCOUNTER — Other Ambulatory Visit: Payer: Self-pay

## 2022-08-16 NOTE — Addendum Note (Signed)
Addended by: Iona Beard on: 08/16/2022 11:39 AM   Modules accepted: Orders

## 2022-08-20 ENCOUNTER — Ambulatory Visit (INDEPENDENT_AMBULATORY_CARE_PROVIDER_SITE_OTHER): Payer: Medicare HMO | Admitting: Student

## 2022-08-20 ENCOUNTER — Encounter: Payer: Self-pay | Admitting: Student

## 2022-08-20 VITALS — BP 152/74 | HR 82 | Temp 98.4°F | Ht 73.0 in | Wt 171.4 lb

## 2022-08-20 DIAGNOSIS — I429 Cardiomyopathy, unspecified: Secondary | ICD-10-CM | POA: Diagnosis not present

## 2022-08-20 DIAGNOSIS — I10 Essential (primary) hypertension: Secondary | ICD-10-CM | POA: Diagnosis not present

## 2022-08-20 DIAGNOSIS — E119 Type 2 diabetes mellitus without complications: Secondary | ICD-10-CM

## 2022-08-20 DIAGNOSIS — I5032 Chronic diastolic (congestive) heart failure: Secondary | ICD-10-CM

## 2022-08-20 DIAGNOSIS — I493 Ventricular premature depolarization: Secondary | ICD-10-CM | POA: Diagnosis not present

## 2022-08-20 DIAGNOSIS — G40209 Localization-related (focal) (partial) symptomatic epilepsy and epileptic syndromes with complex partial seizures, not intractable, without status epilepticus: Secondary | ICD-10-CM | POA: Diagnosis not present

## 2022-08-20 DIAGNOSIS — Z7984 Long term (current) use of oral hypoglycemic drugs: Secondary | ICD-10-CM | POA: Diagnosis not present

## 2022-08-20 DIAGNOSIS — G4720 Circadian rhythm sleep disorder, unspecified type: Secondary | ICD-10-CM | POA: Insufficient documentation

## 2022-08-20 LAB — POCT GLYCOSYLATED HEMOGLOBIN (HGB A1C): Hemoglobin A1C: 5.7 % — AB (ref 4.0–5.6)

## 2022-08-20 LAB — GLUCOSE, CAPILLARY: Glucose-Capillary: 172 mg/dL — ABNORMAL HIGH (ref 70–99)

## 2022-08-20 MED ORDER — METFORMIN HCL 500 MG PO TABS: 500.0000 mg | ORAL_TABLET | Freq: Two times a day (BID) | ORAL | 2 refills | Status: DC

## 2022-08-20 MED ORDER — EMPAGLIFLOZIN 10 MG PO TABS: 10.0000 mg | ORAL_TABLET | Freq: Every day | ORAL | 3 refills | Status: DC

## 2022-08-20 MED ORDER — MELATONIN 5 MG PO TABS: 5.0000 mg | ORAL_TABLET | Freq: Every evening | ORAL | 0 refills | Status: DC | PRN

## 2022-08-20 NOTE — Patient Instructions (Signed)
[name],  It was a pleasure seeing you in the clinic today.   I have stopped the nortriptyline for sleep per our discussion. Instead, I have prescribed melatonin. Please take 1 tablet before bedtime to help with your sleep. Your sugar control is excellent. We are decreasing your metformin to 500mg  twice a day (which should help with your diarrhea). I have prescribed jardiance for you to take once a day to help with your sugar level and this also helps with protecting your heart and kidneys. Please come back in 3 months for your next visit.  Please call our clinic at [phone] if you have any questions or concerns. The best time to call is Monday-Friday from 9am-4pm, but there is someone available 24/7 at the same number. If you need medication refills, please notify your pharmacy one week in advance and they will send [place] a request.   Thank you for letting us take part in your care. We look forward to seeing you next time!

## 2022-08-20 NOTE — Assessment & Plan Note (Signed)
Chronic and stable, continue keppra 750mg  BID.

## 2022-08-20 NOTE — Assessment & Plan Note (Signed)
>>  ASSESSMENT AND PLAN FOR UNCONTROLLED TYPE 2 DIABETES MELLITUS WITH HYPERGLYCEMIA, WITHOUT LONG-TERM CURRENT USE OF INSULIN  (HCC) WRITTEN ON 08/20/2022 10:19 AM BY EMMY ALF, MD  Well controlled. Last A1c was 5.7% and repeat A1c today again 5.7%. He is currently taking metformin  1000mg  BID. He had stopped his jardiance  as he was experiencing diarrhea. His metformin  dose was increased at the last visit from 500mg  BID, which is the more likely culprit for GI symptoms especially given continued diarrhea even with cessation of jardiance . We discussed decreasing metformin  back to 500mg  BID as he was tolerating this dose. Will also restart jardiance  10mg  daily given cardioprotective effects and diagnosis of cardiomyopathy. He is in agreement with plan. Given excellent glycemic control, will plan to have him follow up in 3 months for a repeat A1c but if it remains well-controlled, can lengthen time in between A1c checks to every 6 months.  Plan: -decrease metformin  to 500mg  BID -restart jardiance  10mg  daily -f/u in 3 months, if A1c remains well controlled, spread out A1c checks to every 6 months

## 2022-08-20 NOTE — Assessment & Plan Note (Signed)
Patient with HTN, currently on losartan 25mg  daily, imdur 30mg  daily, jardiance 10mg  daily, and toprol-xl 25mg  qhs. He follows [facility] who recently made the above changes for better control of HTN. He is adherent with these medications, but reports forgetting to take them this AM. BP is 152/74 with repeat 144/72. Will avoid making any medication changes given he has not taken them today.   Plan: -continue imdur, jardiance, toprol-xl, losartan -f/u in 3 months for repeat BP check

## 2022-08-20 NOTE — Assessment & Plan Note (Signed)
Patient noted to have mild cardiomyopathy. Per recent cardiology note, thought to be 2/2 PVC burden or history of chemotherapy. He is currently on toprol-xl, jardiance, losartan, and imdur for management. Plan for repeat echo in 3 months.

## 2022-08-20 NOTE — Progress Notes (Signed)
   CC: f/u HTN, T2DM  HPI:  Jared Shea. is a 67 y.o. male with history listed below presenting to the [facility] for f/u T2DM, HTN. Please see individualized problem based charting for full HPI.  Past Medical History:  Diagnosis Date   Brain mass    Bursitis of right hip    Chest pain 08/12/2018   Chronic cough    Dependence on nicotine from cigarettes    Diabetes mellitus without complication    Dysuria 04/03/2021   Essential hypertension 07/28/2018   Frequent headaches 06/27/2020   nscl ca dx'd 12/2019   Polyarthralgia 06/15/2020   Seizures    Viral illness 03/23/2019    Review of Systems:  Negative aside from that listed in individualized problem based charting.  Physical Exam:  Vitals:   08/20/22 0928  BP: (!) 152/74  Pulse: 82  Temp: 98.4 F (36.9 C)  TempSrc: Oral  SpO2: 100%  Weight: 171 lb 6.4 oz (77.7 kg)  Height: 6\' 1"  (1.854 m)   Physical Exam Constitutional:      Appearance: Normal appearance. He is not ill-appearing.  HENT:     Mouth/Throat:     Mouth: Mucous membranes are moist.     Pharynx: Oropharynx is clear. No oropharyngeal exudate.  Eyes:     General: No scleral icterus.    Extraocular Movements: Extraocular movements intact.     Conjunctiva/sclera: Conjunctivae normal.     Pupils: Pupils are equal, round, and reactive to light.  Cardiovascular:     Rate and Rhythm: Normal rate and regular rhythm.     Heart sounds: Normal heart sounds. No murmur heard.    No friction rub. No gallop.  Pulmonary:     Effort: Pulmonary effort is normal.     Breath sounds: Normal breath sounds. No wheezing, rhonchi or rales.  Abdominal:     General: Bowel sounds are normal. There is no distension.     Palpations: Abdomen is soft.     Tenderness: There is no abdominal tenderness. There is no guarding or rebound.  Musculoskeletal:        General: No swelling. Normal range of motion.  Skin:    General: Skin is warm and dry.  Neurological:     Mental  Status: He is alert and oriented to person, place, and time. Mental status is at baseline.  Psychiatric:        Mood and Affect: Mood normal.        Behavior: Behavior normal.      Assessment & Plan:   See Encounters Tab for problem based charting.  Patient discussed with Dr.  Lafonda Mosses

## 2022-08-20 NOTE — Assessment & Plan Note (Signed)
>>  ASSESSMENT AND PLAN FOR CARDIOMYOPATHY, UNSPECIFIED (HCC) WRITTEN ON 08/20/2022 10:16 AM BY EMMY ALF, MD  Patient noted to have mild cardiomyopathy. Per recent cardiology note, thought to be 2/2 PVC burden or history of chemotherapy. He is currently on toprol -xl, jardiance , losartan , and imdur  for management. Plan for repeat echo in 3 months.

## 2022-08-20 NOTE — Assessment & Plan Note (Signed)
Patient with PVCs with significant burden. Followed [facility]. On toprol-xl 25mg  daily for management.

## 2022-08-20 NOTE — Assessment & Plan Note (Signed)
Well controlled. Last A1c was 5.7% and repeat A1c today again 5.7%. He is currently taking metformin 1000mg  BID. He had stopped his jardiance as he was experiencing diarrhea. His metformin dose was increased at the last visit from 500mg  BID, which is the more likely culprit for GI symptoms especially given continued diarrhea even with cessation of jardiance. We discussed decreasing metformin back to 500mg  BID as he was tolerating this dose. Will also restart jardiance 10mg  daily given cardioprotective effects and diagnosis of cardiomyopathy. He is in agreement with plan. Given excellent glycemic control, will plan to have him follow up in 3 months for a repeat A1c but if it remains well-controlled, can lengthen time in between A1c checks to every 6 months.  Plan: -decrease metformin to 500mg  BID -restart jardiance 10mg  daily -f/u in 3 months, if A1c remains well controlled, spread out A1c checks to every 6 months

## 2022-08-21 ENCOUNTER — Inpatient Hospital Stay: Payer: Medicare HMO | Attending: Internal Medicine

## 2022-08-21 ENCOUNTER — Other Ambulatory Visit: Payer: Self-pay

## 2022-08-21 DIAGNOSIS — Z9221 Personal history of antineoplastic chemotherapy: Secondary | ICD-10-CM | POA: Insufficient documentation

## 2022-08-21 DIAGNOSIS — E119 Type 2 diabetes mellitus without complications: Secondary | ICD-10-CM | POA: Insufficient documentation

## 2022-08-21 DIAGNOSIS — C3490 Malignant neoplasm of unspecified part of unspecified bronchus or lung: Secondary | ICD-10-CM | POA: Insufficient documentation

## 2022-08-21 DIAGNOSIS — Z79899 Other long term (current) drug therapy: Secondary | ICD-10-CM | POA: Diagnosis not present

## 2022-08-21 DIAGNOSIS — Z923 Personal history of irradiation: Secondary | ICD-10-CM | POA: Diagnosis not present

## 2022-08-21 DIAGNOSIS — Z95828 Presence of other vascular implants and grafts: Secondary | ICD-10-CM

## 2022-08-21 DIAGNOSIS — Z7982 Long term (current) use of aspirin: Secondary | ICD-10-CM | POA: Diagnosis not present

## 2022-08-21 DIAGNOSIS — Z7984 Long term (current) use of oral hypoglycemic drugs: Secondary | ICD-10-CM | POA: Insufficient documentation

## 2022-08-21 DIAGNOSIS — I10 Essential (primary) hypertension: Secondary | ICD-10-CM | POA: Insufficient documentation

## 2022-08-21 DIAGNOSIS — Z87891 Personal history of nicotine dependence: Secondary | ICD-10-CM | POA: Insufficient documentation

## 2022-08-21 DIAGNOSIS — C799 Secondary malignant neoplasm of unspecified site: Secondary | ICD-10-CM

## 2022-08-21 DIAGNOSIS — C7931 Secondary malignant neoplasm of brain: Secondary | ICD-10-CM | POA: Insufficient documentation

## 2022-08-21 LAB — CBC WITH DIFFERENTIAL (CANCER CENTER ONLY)
Abs Immature Granulocytes: 0.02 10*3/uL (ref 0.00–0.07)
Basophils Absolute: 0 10*3/uL (ref 0.0–0.1)
Basophils Relative: 1 %
Eosinophils Absolute: 0.1 10*3/uL (ref 0.0–0.5)
Eosinophils Relative: 1 %
HCT: 34.6 % — ABNORMAL LOW (ref 39.0–52.0)
Hemoglobin: 11.6 g/dL — ABNORMAL LOW (ref 13.0–17.0)
Immature Granulocytes: 0 %
Lymphocytes Relative: 18 %
Lymphs Abs: 1.3 10*3/uL (ref 0.7–4.0)
MCH: 28.4 pg (ref 26.0–34.0)
MCHC: 33.5 g/dL (ref 30.0–36.0)
MCV: 84.8 fL (ref 80.0–100.0)
Monocytes Absolute: 0.5 10*3/uL (ref 0.1–1.0)
Monocytes Relative: 6 %
Neutro Abs: 5.6 10*3/uL (ref 1.7–7.7)
Neutrophils Relative %: 74 %
Platelet Count: 393 10*3/uL (ref 150–400)
RBC: 4.08 MIL/uL — ABNORMAL LOW (ref 4.22–5.81)
RDW: 17.7 % — ABNORMAL HIGH (ref 11.5–15.5)
WBC Count: 7.5 10*3/uL (ref 4.0–10.5)
nRBC: 0 % (ref 0.0–0.2)

## 2022-08-21 LAB — CMP (CANCER CENTER ONLY)
ALT: 21 U/L (ref 0–44)
AST: 24 U/L (ref 15–41)
Albumin: 4.5 g/dL (ref 3.5–5.0)
Alkaline Phosphatase: 73 U/L (ref 38–126)
Anion gap: 8 (ref 5–15)
BUN: 10 mg/dL (ref 8–23)
CO2: 26 mmol/L (ref 22–32)
Calcium: 9.4 mg/dL (ref 8.9–10.3)
Chloride: 107 mmol/L (ref 98–111)
Creatinine: 0.75 mg/dL (ref 0.61–1.24)
GFR, Estimated: >60 mL/min (ref >60)
Glucose, Bld: 166 mg/dL — ABNORMAL HIGH (ref 70–99)
Potassium: 3.7 mmol/L (ref 3.5–5.1)
Sodium: 141 mmol/L (ref 135–145)
Total Bilirubin: 1.1 mg/dL (ref 0.3–1.2)
Total Protein: 7.9 g/dL (ref 6.5–8.1)

## 2022-08-21 MED ORDER — SODIUM CHLORIDE 0.9% FLUSH
10.0000 mL | Freq: Once | INTRAVENOUS | Status: AC
  Administered 2022-08-21: 10 mL

## 2022-08-21 MED ORDER — HEPARIN SOD (PORK) LOCK FLUSH 100 UNIT/ML IV SOLN
500.0000 [IU] | Freq: Once | INTRAVENOUS | Status: AC
  Administered 2022-08-21: 500 [IU]

## 2022-08-21 NOTE — Progress Notes (Signed)
Cardiology Office Note:    Date:  08/24/2022   ID:  Jared Shea., DOB 1955-12-09, MRN [id number]  PCP:  Rana Snare, DO   [facility] Providers Cardiologist:  Alverda Skeans, MD Referring MD: Rana Snare, DO   Chief Complaint/Reason for Referral:  PVCs and cardiomyopathy   PATIENT DID NOT APPEAR FOR APPOINTMENT   ASSESSMENT:    1. Cardiomyopathy, unspecified type   2. Type 2 diabetes mellitus without complication, without long-term current use of insulin   3. Hypertension associated with diabetes   4. Hyperlipidemia associated with type 2 diabetes mellitus   5. Aortic atherosclerosis   6. Coronary artery disease of native artery of native heart with stable angina pectoris   7. PVC (premature ventricular contraction)   8. Tobacco abuse      PLAN:    In order of problems listed above: 1.  Mild cardiomyopathy:  2.  Type 2 diabetes: Continue aspirin 81 mg daily, losartan, atorvastatin 20 mg daily, and Jardiance 10 mg daily. 3.  Hypertension:  4.  Hyperlipidemia: Lipid panel was at goal with an LDL of 23 recently.   5.  Aortic atherosclerosis: Continue aspirin and atorvastatin, and pursue strict blood pressure control. 6.  Coronary artery disease:  7.  PVCs: EKG today 8.  Tobacco abuse: Carotid ultrasounds in 2022 were reassuring.  Will obtain vascular scan for ABIs and abdominal aneurysm.  PFTs have been ordered.       Dispo:  No follow-ups on file.      Medication Adjustments/Labs and Tests Ordered: Current medicines are reviewed at length with the patient today.  Concerns regarding medicines are outlined above.  The following changes have been made:     Labs/tests ordered: No orders of the defined types were placed in this encounter.   Medication Changes: No orders of the defined types were placed in this encounter.    Current medicines are reviewed at length with the patient today.  The patient does not have concerns regarding  medicines.   History of Present Illness:    FOCUSED PROBLEM LIST:   1.  Type 2 diabetes on metformin 2.  Hypertension 3.  Aortic atherosclerosis on chest CT 2024 4.  Non-small cell lung cancer stage IV metastatic to the brain followed by oncology; patient was treated with carboplatin, Taxol, palonosetron and XRT in 2021; s/p cemiplimab immunotherapy 5.  Mild cardiomyopathy with ejection fraction of 40 to 45% on TTE February 2024 6.  Coronary artery disease with CT FFR positive OM 2 and mid RCA lesions on CTA March 2024 7.  Hyperlipidemia; low LP(a)   March 2024: The patient is a 67 y.o. male with the indicated medical history here for recommendations regarding PVCs and incidentally noted mild cardiomyopathy.  The patient tells me that his cancer is relatively stable and he is done with immunotherapy.  He tells me he gets short of breath at times but is this does not seem to be a routine occurrence.  He is not short of breath at rest.  He denies any orthopnea, paroxysmal nocturnal dyspnea, or peripheral edema.  He occasionally gets chest tightness when he exerts himself however.  This does seem to improve with rest.  He denies any presyncope, syncope, or palpitations.  He has not required any emergency room visits or hospitalizations recently.  Plan: Start Toprol 25 mg daily, Jardiance 10 mg daily, Lipitor 20 mg daily, aspirin 81 mg daily; obtain coronary CTA and PFTs.  Today: The patient returns for expedited  follow-up.  His coronary CTA demonstrated functionally significant lesions of the second obtuse marginal and mid right coronary artery.  He was started on Imdur.       Current Medications: No outpatient medications have been marked as taking for the 08/24/22 encounter (Office Visit) with Orbie Pyo, MD.     Allergies:    Penicillins   Social History:   Social History   Tobacco Use   Smoking status: Former    Packs/day: 1.00    Years: 48.00    Additional pack years: 0.00     Total pack years: 48.00    Types: Cigarettes    Start date: 08/07/1970    Quit date: 12/15/2019    Years since quitting: 2.6   Smokeless tobacco: Never  Vaping Use   Vaping Use: Never used  Substance Use Topics   Alcohol use: Not Currently   Drug use: Never     Family Hx: Family History  Problem Relation Age of Onset   Heart disease Mother        CABG   Kidney disease Sister    Diabetes Brother    Kidney disease Brother        KIDNEY TRANSPLANT   Hypertension Sister    Healthy Daughter    Healthy Daughter    Healthy Son      Review of Systems:   Please see the history of present illness.    All other systems reviewed and are negative.     EKGs/Labs/Other Test Reviewed:    EKG:  EKG performed January 2024 that I personally reviewed demonstrates sinus rhythm with PVCs  Prior CV studies:  Cardiac Studies & Procedures     STRESS TESTS  MYOCARDIAL PERFUSION IMAGING 10/20/2018  Narrative  Nuclear stress EF: 54%.  The study is normal.  Low risk stress nuclear study with normal perfusion and normal left ventricular regional and global systolic function.   ECHOCARDIOGRAM  ECHOCARDIOGRAM COMPLETE 06/27/2022  Narrative ECHOCARDIOGRAM REPORT    Patient Name:   Jared Shea. Date of Exam: 06/27/2022 Medical Rec #:  [id number]       Height:       73.0 in Accession #:    [id number]      Weight:       175.7 lb Date of Birth:  Oct 09, 1955       BSA:          2.036 m Patient Age:    66 years        BP:           133/76 mmHg Patient Gender: M               HR:           91 bpm. Exam Location:  Outpatient  Procedure: 2D Echo, 3D Echo, Cardiac Doppler, Color Doppler and Strain Analysis  Indications:    Abnormal ECG R94.31  History:        Patient has prior history of Echocardiogram examinations, most recent 10/20/2018. Arrythmias:PVC; Risk Factors:Hypertension, Diabetes and Former Smoker. Lung Cancer.  Sonographer:    Leta Jungling RDCS Referring Phys: Gwenevere Abbot  IMPRESSIONS   1. Left ventricular ejection fraction, by estimation, is 45 to 50%. Left ventricular ejection fraction by 3D volume is 45 %. The left ventricle has mildly decreased function. The left ventricle demonstrates global hypokinesis. Left ventricular diastolic parameters were normal. The average left ventricular global longitudinal strain is -11.2 %. The global longitudinal strain is abnormal.  2. Right ventricular systolic function is normal. The right ventricular size is normal. 3. Left atrial size was mildly dilated. 4. The mitral valve is normal in structure. Mild to moderate mitral valve regurgitation. No evidence of mitral stenosis. 5. The aortic valve has an indeterminant number of cusps. There is mild calcification of the aortic valve. There is mild thickening of the aortic valve. Aortic valve regurgitation is mild to moderate. No aortic stenosis is present. Aortic regurgitation PHT measures 335 msec. 6. The inferior vena cava is normal in size with greater than 50% respiratory variability, suggesting right atrial pressure of 3 mmHg.  Comparison(s): Changes from prior study are noted.  Conclusion(s)/Recommendation(s): Frequent PVCs throughout study. Mildly reduced LVEF, abnormal strain with apical sparing, cannot exclude infiltrative process.  FINDINGS Left Ventricle: Left ventricular ejection fraction, by estimation, is 45 to 50%. Left ventricular ejection fraction by 3D volume is 45 %. The left ventricle has mildly decreased function. The left ventricle demonstrates global hypokinesis. The average left ventricular global longitudinal strain is -11.2 %. The global longitudinal strain is abnormal. The left ventricular internal cavity size was normal in size. There is no left ventricular hypertrophy. Left ventricular diastolic parameters were normal.  Right Ventricle: The right ventricular size is normal. No increase in right ventricular wall thickness. Right ventricular  systolic function is normal.  Left Atrium: Left atrial size was mildly dilated.  Right Atrium: Right atrial size was normal in size.  Pericardium: There is no evidence of pericardial effusion.  Mitral Valve: The mitral valve is normal in structure. There is mild thickening of the mitral valve leaflet(s). Mild to moderate mitral valve regurgitation. No evidence of mitral valve stenosis. MV peak gradient, 0.7 mmHg. The mean mitral valve gradient is 0.0 mmHg.  Tricuspid Valve: The tricuspid valve is grossly normal. Tricuspid valve regurgitation is trivial. No evidence of tricuspid stenosis.  Aortic Valve: The aortic valve has an indeterminant number of cusps. There is mild calcification of the aortic valve. There is mild thickening of the aortic valve. Aortic valve regurgitation is mild to moderate. Aortic regurgitation PHT measures 335 msec. No aortic stenosis is present.  Pulmonic Valve: The pulmonic valve was grossly normal. Pulmonic valve regurgitation is trivial. No evidence of pulmonic stenosis.  Aorta: The aortic root, ascending aorta, aortic arch and descending aorta are all structurally normal, with no evidence of dilitation or obstruction.  Venous: The inferior vena cava is normal in size with greater than 50% respiratory variability, suggesting right atrial pressure of 3 mmHg.  IAS/Shunts: No atrial level shunt detected by color flow Doppler.   LEFT VENTRICLE PLAX 2D LVIDd:         5.40 cm         Diastology LVIDs:         4.40 cm         LV e' medial:    13.30 cm/s LV PW:         0.80 cm         LV E/e' medial:  5.2 LV IVS:        0.90 cm         LV e' lateral:   10.10 cm/s LVOT diam:     2.20 cm         LV E/e' lateral: 6.9 LV SV:         59 LV SV Index:   29              2D LVOT Area:  3.80 cm        Longitudinal Strain 2D Strain GLS  -12.1 % (A2C): 2D Strain GLS  -10.3 % (A3C): 2D Strain GLS  -11.2 % (A4C): 2D Strain GLS  -11.2 % Avg:  3D Volume EF LV 3D  EF:    Left ventricul ar ejection fraction by 3D volume is 45 %.  3D Volume EF: 3D EF:        45 % LV EDV:       163 ml LV ESV:       89 ml LV SV:        74 ml  RIGHT VENTRICLE RV S prime:     11.50 cm/s TAPSE (M-mode): 2.0 cm  LEFT ATRIUM             Index        RIGHT ATRIUM           Index LA diam:        4.90 cm 2.41 cm/m   RA Area:     13.00 cm LA Vol (A2C):   58.4 ml 28.68 ml/m  RA Volume:   27.10 ml  13.31 ml/m LA Vol (A4C):   62.8 ml 30.84 ml/m LA Biplane Vol: 62.5 ml 30.69 ml/m AORTIC VALVE LVOT Vmax:   90.20 cm/s LVOT Vmean:  62.900 cm/s LVOT VTI:    0.156 m AI PHT:      335 msec  AORTA Ao Root diam: 3.00 cm Ao Asc diam:  2.90 cm  MITRAL VALVE MV Area (PHT): 5.16 cm       SHUNTS MV Area VTI:   9.42 cm       Systemic VTI:  0.16 m MV Peak grad:  0.7 mmHg       Systemic Diam: 2.20 cm MV Mean grad:  0.0 mmHg MV Vmax:       0.42 m/s MV Vmean:      29.0 cm/s MV Decel Time: 147 msec MR Peak grad:    121.4 mmHg MR Mean grad:    72.0 mmHg MR Vmax:         551.00 cm/s MR Vmean:        400.0 cm/s MR PISA:         0.57 cm MR PISA Eff ROA: 4 mm MR PISA Radius:  0.30 cm MV E velocity: 69.70 cm/s MV A velocity: 37.60 cm/s MV E/A ratio:  1.85  Jodelle RedBridgette Christopher MD Electronically signed by Jodelle RedBridgette Christopher MD Signature Date/Time: 06/27/2022/4:04:08 PM    Final     CT SCANS  CT CORONARY MORPH W/CTA COR W/SCORE 07/31/2022  Addendum 07/31/2022  9:32 AM ADDENDUM REPORT: 07/31/2022 09:29  EXAM: OVER-READ INTERPRETATION  CT CHEST  The following report is an over-read performed by radiologist Dr. Curly ShoresJoshua Pleasureof [facility] on 07/31/2022. This over-read does not include interpretation of cardiac or coronary anatomy or pathology. The coronary CTA interpretation by the cardiologist is attached.  COMPARISON:  05/16/2022  FINDINGS: Cardiovascular: No incidental pulmonary artery filling defects to suggest PE. Minimal  left-sided pleural effusion. Dependent bibasilar subsegmental atelectasis. Right-sided Port-A-Cath tip is in the right atrium.  Mediastinum/Nodes: No enlarged mediastinal, hilar, or axillary lymph nodes. Thyroid gland, trachea, and esophagus demonstrate no significant findings.  Lungs/Pleura: Imaged lungs are clear. No pleural effusion or pneumothorax.  Upper Abdomen: No acute abnormality.  Musculoskeletal: No chest wall abnormality. No acute or significant osseous findings.  IMPRESSION: Minimal left-sided pleural effusion and dependent bibasilar subsegmental atelectasis. Otherwise no significant  extracardiac incidental findings identified.   Electronically Signed By: Layla Maw M.D. On: 07/31/2022 09:29  Narrative CLINICAL DATA:  67 Year-old African American Male  EXAM: Cardiac/Coronary  CTA  TECHNIQUE: The patient was scanned on a [employer].  FINDINGS: Scan was triggered in the descending thoracic aorta. Axial non-contrast 3 mm slices were carried out through the heart. The data set was analyzed on a dedicated work station and scored using the Agatson method. Gantry rotation speed was 250 msecs and collimation was .6 mm. 0.8 mg of sl NTG was given. The 3D data set was reconstructed in 5% intervals of the 67-82 % of the R-R cycle. Diastolic phases were analyzed on a dedicated work station using MPR, MIP and VRT modes. The patient received 100 cc of contrast.  Coronary Arteries:  Normal coronary origin.  Right dominance.  Coronary Calcium Score:  Left main: 0  Left anterior descending artery: 92  Left circumflex artery: 268  Right coronary artery: 50  Total: 410  Percentile: 88th for age, sex, and race matched control.  RCA is a large dominant artery that gives rise to PDA . Moderate stenosis (50-70%) in mid RCA. Minimal non-obstructive calcified plaques (1-24%) in distal RCA.  Left main is a large artery that gives rise to LAD and  LCX arteries. There is no significant plaque.  LAD is a large vessel that gives rise to two small diagonal vessels. Mild non-obstructive mixed plaque (25-49%) in proximal LAD . Mild non-obstructive mixed plaque (25-49%) in mid LAD.  LCX is a non-dominant artery that gives rise to one large OM2 branch (small OM1 vessel) and L-PLA. Moderate stenosis (50-70%) in ostial OM2.  Other findings:  Aorta: Normal size.  Aortic atherosclerosis.  Main Pulmonary Artery: Normal size of the pulmonary artery.  Systemic Veins: Normal drainage; a catheter is seen in the SVC that extends into the right atrium  Aortic Valve:  Tri-leaflet.  No calcifications.  Mitral valve: No calcifications or stenosis.  Normal pulmonary vein drainage into the left atrium.  Normal left atrial appendage without a thrombus.  Interatrial septum shows small PFO  Left Ventricle: Normal size  Left Atrium: Normal size  Right Ventricle: Normal size  Right Atrium: Normal size  Pericardium: Normal thickness  Extra-cardiac findings: See attached radiology report for non-cardiac structures.  IMPRESSION: 1. Coronary calcium score of 410. This was 88th percentile for age, sex, and race matched control.  2. Normal coronary origin with right dominance.  3. CAD-RADS 3. Moderate stenosis. Consider symptom-guided anti-ischemic pharmacotherapy as well as risk factor modification per guideline directed care. Additional analysis with CT FFR will be submitted.  RECOMMENDATIONS: RECOMMENDATIONS The proposed cut-off value of 1,651 AU yielded a 93 % sensitivity and 75 % specificity in grading AS severity in patients with classical low-flow, low-gradient AS. Proposed different cut-off values to define severe AS for men and women as 2,065 AU and 1,274 AU, respectively. The joint European and American recommendations for the assessment of AS consider the aortic valve calcium score as a continuum - a very high calcium  score suggests severe AS and a low calcium score suggests severe AS is unlikely.  Sunday Shams, et al. 2017 ESC/EACTS Guidelines for the management of valvular heart disease. Eur Heart J 2017;38:2739-91.  Coronary artery calcium (CAC) score is a strong predictor of incident coronary heart disease (CHD) and provides predictive information beyond traditional risk factors. CAC scoring is reasonable to use in the decision to withhold, postpone,  or initiate statin therapy in intermediate-risk or selected borderline-risk asymptomatic adults (age 59-75 years and LDL-C >=70 to <190 mg/dL) who do not have diabetes or established atherosclerotic cardiovascular disease (ASCVD).* In intermediate-risk (10-year ASCVD risk >=7.5% to <20%) adults or selected borderline-risk (10-year ASCVD risk >=5% to <7.5%) adults in whom a CAC score is measured for the purpose of making a treatment decision the following recommendations have been made:  If CAC = 0, it is reasonable to withhold statin therapy and reassess in 5 to 10 years, as long as higher risk conditions are absent (diabetes mellitus, family history of premature CHD in first degree relatives (males <55 years; females <65 years), cigarette smoking, LDL >=190 mg/dL or other independent risk factors).  If CAC is 1 to 99, it is reasonable to initiate statin therapy for patients >=16 years of age.  If CAC is >=100 or >=75th percentile, it is reasonable to initiate statin therapy at any age.  Cardiology referral should be considered for patients with CAC scores =400 or >=75th percentile.  *2018 AHA/ACC/AACVPR/AAPA/ABC/ACPM/ADA/AGS/APhA/ASPC/NLA/PCNA Guideline on the Management of Blood Cholesterol: A Report of the American College of Cardiology/American Heart Association Task Force on Clinical Practice Guidelines. J Am Coll Cardiol. 2019;73(24):3168-3209.  Riley Lam, MD  Electronically Signed: By: Riley Lam M.D. On: 07/27/2022 15:16          Other studies Reviewed: Review of the additional studies/records demonstrates: Aortic atherosclerosis with coronary artery calcifications on chest CT 2024   Recent Labs: 04/10/2022: TSH 1.065 08/21/2022: ALT 21; BUN 10; Creatinine 0.75; Hemoglobin 11.6; Platelet Count 393; Potassium 3.7; Sodium 141   Recent Lipid Panel Lab Results  Component Value Date/Time   CHOL 69 (L) 07/24/2022 11:32 AM   TRIG 75 07/24/2022 11:32 AM   HDL 30 (L) 07/24/2022 11:32 AM   LDLCALC 23 07/24/2022 11:32 AM    Risk Assessment/Calculations:             Physical Exam:      Signed, Orbie Pyo, MD  08/24/2022 10:26 AM    [facility] [address] [place], [place], [place]  [address] Phone: [phone]; Fax: [phone]   Note:  This document was prepared using Dragon voice recognition software and may include unintentional dictation errors.

## 2022-08-21 NOTE — Progress Notes (Signed)
Internal Medicine Clinic Attending  Case discussed with Dr. Amponsah  At the time of the visit.  We reviewed the resident's history and exam and pertinent patient test results.  I agree with the assessment, diagnosis, and plan of care documented in the resident's note.  

## 2022-08-22 ENCOUNTER — Ambulatory Visit (HOSPITAL_COMMUNITY)
Admission: RE | Admit: 2022-08-22 | Discharge: 2022-08-22 | Disposition: A | Discharge status: Home / Self Care | Payer: Medicare HMO | Source: Ambulatory Visit | Attending: Physician Assistant | Admitting: Physician Assistant

## 2022-08-22 ENCOUNTER — Encounter (HOSPITAL_COMMUNITY): Payer: Self-pay

## 2022-08-22 DIAGNOSIS — K3189 Other diseases of stomach and duodenum: Secondary | ICD-10-CM | POA: Diagnosis not present

## 2022-08-22 DIAGNOSIS — C3491 Malignant neoplasm of unspecified part of right bronchus or lung: Secondary | ICD-10-CM | POA: Diagnosis not present

## 2022-08-22 DIAGNOSIS — J432 Centrilobular emphysema: Secondary | ICD-10-CM | POA: Diagnosis not present

## 2022-08-22 DIAGNOSIS — R911 Solitary pulmonary nodule: Secondary | ICD-10-CM | POA: Diagnosis not present

## 2022-08-22 MED ORDER — HEPARIN SOD (PORK) LOCK FLUSH 100 UNIT/ML IV SOLN
500.0000 [IU] | Freq: Once | INTRAVENOUS | Status: AC
  Administered 2022-08-22: 500 [IU] via INTRAVENOUS

## 2022-08-22 MED ORDER — SODIUM CHLORIDE (PF) 0.9 % IJ SOLN
INTRAMUSCULAR | Status: AC
  Filled 2022-08-22: qty 50

## 2022-08-22 MED ORDER — IOHEXOL 300 MG/ML  SOLN
100.0000 mL | Freq: Once | INTRAMUSCULAR | Status: AC | PRN
  Administered 2022-08-22: 100 mL via INTRAVENOUS

## 2022-08-22 MED ORDER — HEPARIN SOD (PORK) LOCK FLUSH 100 UNIT/ML IV SOLN
INTRAVENOUS | Status: AC
  Filled 2022-08-22: qty 5

## 2022-08-23 NOTE — Progress Notes (Signed)
[facility] OFFICE PROGRESS NOTE  Rana Snare, DO [address] [place] [address]  DIAGNOSIS: Stage IV (TX, N2, M1 C) non-small cell lung cancer, adenocarcinoma diagnosed in August 2021 and presented with solitary brain metastasis in addition to mediastinal lymphadenopathy.   PDL1 Expression 70%   Molecular Biomarkers:  Tumor Mutational Burden - 52 Muts/Mb Microsatellite status - MS-Stable Genomic Findings For a complete list of the genes assayed, please refer to the Appendix. NF1 E1694* MTAP loss exons 2-8 RICTOR amplification ATRX A419V BRAF K483E CDKN2A/B CDKN2A loss, CDKN2B loss DNMT3A E205* FGF10 amplification NTRK1 amplification - equivocal? 7 Disease relevant genes with no reportable alterations: ALK, EGFR, ERBB2, KRAS, MET, RET, ROS1   PRIOR THERAPY: 1) Status post right craniotomy with tumor resection followed by [facility] to solitary brain metastasis under the care of Dr. Mitzi Hansen and Dr. Murrell Redden. 2) Concurrent chemoradiation with weekly carboplatin for AUC of 2 and paclitaxel 45 mg/M2.  First dose 02/01/2020. Status post 5 cycles.  Last dose was given February 29, 2020. 3) First-line treatment with immunotherapy with Libtayo (Cempilimab) 350 mg IV every 3 weeks. Last dose on 04/10/22.  Status post 35 cycles.     CURRENT THERAPY: Observation   INTERVAL HISTORY: Jared Shea. 67 y.o. male returns to the clinic today for a follow-up visit.  The patient recently completed 2 years of immunotherapy with Libtayo in November 2023 and tolerated well overall except for some joint pain (which has since resolved).  He has been on observation since that time and feeling fairly well.  Today he denies any fever, chills, or night sweats.  His appetite is "iffy". His occasional dyspnea is stable.  Denies any cough, chest pain, or hemoptysis.  Has been following with cardiology and his PCP. He is currently on metformin for his diabetes. He states the dose was recently reduced  due to diarrhea. He also struggles with insomnia and is working with his PCP regarding this. He was given melatonin last week per chart review. Denies any nausea, vomiting, or constipation. Denies any rashes or skin changes.  Denies any headache or visual changes.  He follows closely with neuro-oncology and is expected to have repeat brain MRI in June 2024. He recently had a restaging CT scan performed.  He is here today for evaluation to review his scan results.   MEDICAL HISTORY: Past Medical History:  Diagnosis Date   Brain mass    Bursitis of right hip    Chest pain 08/12/2018   Chronic cough    Dependence on nicotine from cigarettes    Diabetes mellitus without complication    Dysuria 04/03/2021   Essential hypertension 07/28/2018   Frequent headaches 06/27/2020   nscl ca dx'd 12/2019   Polyarthralgia 06/15/2020   Seizures    Viral illness 03/23/2019    ALLERGIES:  is allergic to penicillins.  MEDICATIONS:  Current Outpatient Medications  Medication Sig Dispense Refill   albuterol (VENTOLIN HFA) 108 (90 Base) MCG/ACT inhaler Inhale 2 puffs into the lungs every 6 (six) hours as needed for wheezing or shortness of breath. 8 g 2   aspirin EC 81 MG tablet Take 1 tablet (81 mg total) by mouth daily. Swallow whole. 90 tablet 3   atorvastatin (LIPITOR) 20 MG tablet Take 1 tablet (20 mg total) by mouth daily. 90 tablet 3   blood glucose meter kit and supplies KIT Dispense based on patient and insurance preference. Use up to three times daily as directed. 1 each 0  empagliflozin (JARDIANCE) 10 MG TABS tablet Take 1 tablet (10 mg total) by mouth daily before breakfast. 90 tablet 3   glucose blood (ONETOUCH VERIO) test strip TEST 3 TIMES DAILY 100 strip 11   isosorbide mononitrate (IMDUR) 30 MG 24 hr tablet Take 1 tablet (30 mg total) by mouth daily. 90 tablet 3   Lancets (ONETOUCH DELICA PLUS LANCET30G) MISC 3 (three) times daily. as directed     levETIRAcetam (KEPPRA) 750 MG tablet Take  1 tablet (750 mg total) by mouth 2 (two) times daily. 180 tablet 4   losartan (COZAAR) 25 MG tablet TAKE 1 TABLET (25 MG TOTAL) DAILY. 90 tablet 3   melatonin 5 MG TABS Take 1 tablet (5 mg total) by mouth at bedtime as needed (for sleep). 30 tablet 0   metFORMIN (GLUCOPHAGE) 500 MG tablet Take 1 tablet (500 mg total) by mouth 2 (two) times daily with a meal. 120 tablet 2   metoprolol succinate (TOPROL XL) 25 MG 24 hr tablet Take 1 tablet (25 mg total) by mouth at bedtime. 90 tablet 3   No current facility-administered medications for this visit.    SURGICAL HISTORY:  Past Surgical History:  Procedure Laterality Date   APPLICATION OF CRANIAL NAVIGATION N/A 01/07/2020   Procedure: APPLICATION OF CRANIAL NAVIGATION;  Surgeon: Coletta Memos, MD;  Location: [facility] OR;  Service: Neurosurgery;  Laterality: N/A;   CRANIOTOMY Right 01/07/2020   Procedure: RIGHT CRANIOTOMY FOR TUMOR RESECTION;  Surgeon: Coletta Memos, MD;  Location: [facility] OR;  Service: Neurosurgery;  Laterality: Right;  rm 21   ENDOBRONCHIAL ULTRASOUND N/A 12/21/2019   Procedure: ENDOBRONCHIAL ULTRASOUND;  Surgeon: Tomma Lightning, MD;  Location: [facility] ENDOSCOPY;  Service: Pulmonary;  Laterality: N/A;   FINE NEEDLE ASPIRATION  12/21/2019   Procedure: FINE NEEDLE ASPIRATION (FNA) LINEAR;  Surgeon: Tomma Lightning, MD;  Location: [facility] ENDOSCOPY;  Service: Pulmonary;;   IR IMAGING GUIDED PORT INSERTION  03/03/2020   NO PAST SURGERIES     VIDEO BRONCHOSCOPY N/A 12/21/2019   Procedure: VIDEO BRONCHOSCOPY WITHOUT FLUORO;  Surgeon: Tomma Lightning, MD;  Location: [facility] ENDOSCOPY;  Service: Pulmonary;  Laterality: N/A;    REVIEW OF SYSTEMS:   Constitutional: Negative for appetite change, chills, fatigue, fever and unexpected weight change.  HENT: Negative for mouth sores, nosebleeds, sore throat and trouble swallowing.  Eyes: Negative for eye problems and icterus.  Respiratory: Positive for intermittent shortness of breath (worse if flexes neck).  Negative for cough, hemoptysis, and wheezing.   Cardiovascular: Negative for chest pain and leg swelling.  Gastrointestinal: Positive for occasional diarrhea. Negative for abdominal pain, constipation, nausea and vomiting.  Genitourinary: Negative for bladder incontinence, difficulty urinating, dysuria, frequency and hematuria.   Musculoskeletal: Negative for back pain, gait problem, neck pain and neck stiffness.  Skin: Negative for itching and rash.  Neurological: Negative for dizziness, extremity weakness, gait problem, headaches, light-headedness and seizures.  Hematological: Negative for adenopathy. Does not bruise/bleed easily.  Psychiatric/Behavioral: Negative for confusion, depression and sleep disturbance. The patient is not nervous/anxious.    PHYSICAL EXAMINATION:  Blood pressure (!) 146/87, pulse 78, temperature (!) 97.3 F (36.3 C), temperature source Temporal, resp. rate 14, weight 170 lb 9.6 oz (77.4 kg), SpO2 100 %.  ECOG PERFORMANCE STATUS: 0-1  Physical Exam  Constitutional: Oriented to person, place, and time and well-developed, well-nourished, and in no distress. No distress.  HENT:  Head: Normocephalic and atraumatic.  Mouth/Throat: Oropharynx is clear and moist. No oropharyngeal exudate.  Eyes: Conjunctivae are  normal. Right eye exhibits no discharge. Left eye exhibits no discharge. No scleral icterus.  Neck: Normal range of motion. Neck supple.  Cardiovascular: Normal rate, regular rhythm, normal heart sounds and intact distal pulses.   Pulmonary/Chest: Effort normal. Quiet breath sounds in all lung fields. No respiratory distress. No wheezes. No rales.  Abdominal: Soft. Bowel sounds are normal. Exhibits no distension and no mass. There is no tenderness.  Musculoskeletal: Normal range of motion. Exhibits no edema.  Lymphadenopathy:    No cervical adenopathy.  Neurological: Alert and oriented to person, place, and time. Exhibits normal muscle tone. Gait normal.  Coordination normal.  Skin: Skin is warm and dry. No rash noted. Not diaphoretic. No erythema. No pallor.  Psychiatric: Mood, memory and judgment normal.  Vitals reviewed.  LABORATORY DATA: Lab Results  Component Value Date   WBC 7.5 08/21/2022   HGB 11.6 (L) 08/21/2022   HCT 34.6 (L) 08/21/2022   MCV 84.8 08/21/2022   PLT 393 08/21/2022      Chemistry      Component Value Date/Time   NA 141 08/21/2022 0944   NA 143 07/24/2022 1132   K 3.7 08/21/2022 0944   CL 107 08/21/2022 0944   CO2 26 08/21/2022 0944   BUN 10 08/21/2022 0944   BUN 8 07/24/2022 1132   CREATININE 0.75 08/21/2022 0944      Component Value Date/Time   CALCIUM 9.4 08/21/2022 0944   ALKPHOS 73 08/21/2022 0944   AST 24 08/21/2022 0944   ALT 21 08/21/2022 0944   BILITOT 1.1 08/21/2022 0944       RADIOGRAPHIC STUDIES:  CT CHEST ABDOMEN PELVIS W CONTRAST  Result Date: 08/23/2022 CLINICAL DATA:  67 year old male with history of metastatic non-small cell lung cancer undergoing immunotherapy and chemotherapy. Follow-up study. * Tracking Code: BO * EXAM: CT CHEST, ABDOMEN, AND PELVIS WITH CONTRAST TECHNIQUE: Multidetector CT imaging of the chest, abdomen and pelvis was performed following the standard protocol during bolus administration of intravenous contrast. RADIATION DOSE REDUCTION: This exam was performed according to the departmental dose-optimization program which includes automated exposure control, adjustment of the mA and/or kV according to patient size and/or use of iterative reconstruction technique. CONTRAST:  OMNIPAQUE IOHEXOL 300 MG/ML  SOLN COMPARISON:  CT of the chest, abdomen and pelvis 05/16/2022. FINDINGS: CT CHEST FINDINGS Cardiovascular: Heart size is normal. Trace amount of anterior pericardial fluid or thickening, unlikely to be of any hemodynamic significance at this time. No pericardial calcification. There is aortic atherosclerosis, as well as atherosclerosis of the great vessels of  the mediastinum and the coronary arteries, including calcified atherosclerotic plaque in the left anterior descending, left circumflex and right coronary arteries. Right internal jugular single-lumen Port-A-Cath with tip terminating in the right atrium. Mediastinum/Nodes: No pathologically enlarged mediastinal or hilar lymph nodes. Please note that accurate exclusion of hilar adenopathy is limited on noncontrast CT scans. Esophagus is unremarkable in appearance. No axillary lymphadenopathy. Lungs/Pleura: In the posterior aspect of the right upper lobe abutting the major fissure (axial image 72 series 4) there is a very subtle 8 x 4 mm (mean diameter of 6 mm) area of nodular architectural distortion, grossly stable compared to the recent prior study, but smaller than more remote prior examinations. No other new suspicious appearing pulmonary nodules or masses are noted. No acute consolidative airspace disease. No pleural effusions. Mild diffuse bronchial wall thickening with mild centrilobular and paraseptal emphysema. Musculoskeletal: There are no aggressive appearing lytic or blastic lesions noted in the visualized  portions of the skeleton. CT ABDOMEN PELVIS FINDINGS Hepatobiliary: No suspicious cystic or solid hepatic lesions. No intra or extrahepatic biliary ductal dilatation. Gallbladder is unremarkable in appearance. Pancreas: No pancreatic mass. No pancreatic ductal dilatation. No pancreatic or peripancreatic fluid collections or inflammatory changes. Spleen: Unremarkable. Adrenals/Urinary Tract: Bilateral kidneys and bilateral adrenal glands are normal in appearance. No hydroureteronephrosis. Urinary bladder is normal in appearance. Stomach/Bowel: The appearance of the stomach is normal. No pathologic dilatation of small bowel or colon. The appendix is not confidently identified and may be surgically absent. Regardless, there are no inflammatory changes noted adjacent to the cecum to suggest the presence of  an acute appendicitis at this time. Vascular/Lymphatic: Atherosclerosis in the abdominal aorta and pelvic vasculature, without evidence of aneurysm or dissection. No lymphadenopathy noted in the abdomen or pelvis. Reproductive: Prostate gland and seminal vesicles are unremarkable in appearance. Other: No significant volume of ascites.  No pneumoperitoneum. Musculoskeletal: There are no aggressive appearing lytic or blastic lesions noted in the visualized portions of the skeleton. IMPRESSION: 1. Stable examination demonstrating no definitive findings to suggest residual viable tumor or new metastatic disease in the chest, abdomen or pelvis. 2. Aortic atherosclerosis, in addition to three-vessel coronary artery disease. Please note that although the presence of coronary artery calcium documents the presence of coronary artery disease, the severity of this disease and any potential stenosis cannot be assessed on this non-gated CT examination. Assessment for potential risk factor modification, dietary therapy or pharmacologic therapy may be warranted, if clinically indicated. 3. Mild diffuse bronchial wall thickening with mild centrilobular and paraseptal emphysema. 4. Additional incidental findings, as above. Electronically Signed   By: Trudie Reed M.D.   On: 08/23/2022 06:42     ASSESSMENT/PLAN:  This is a very pleasant 66 year old African-American male diagnosed with stage IV (Tx, N2, M1c) non-small cell lung cancer, adenocarcinoma.  He presented with a solitary brain metastasis in addition to right hilar and mediastinal lymphadenopathy.  He was diagnosed in August 2021.     The patient is status post right craniotomy with resection of the solitary brain metastasis with SRS under the care of Dr. Mitzi Hansen and Dr. Franky Macho.    He completed weekly concurrent chemoradiation with carboplatin for an AUC of 2 and paclitaxel 45 mg per metered square.  He is status post 5 cycles.  He tolerated it well except for some  dysphagia/odynophagia.   The patient then had evidence of new brain metastases.  He is status post SRS to the new lesions on 04/22/2020 under the care of Dr. Mitzi Hansen.   The patient recently completed immunotherapy with Libtayo IV every 3 weeks.  He is status post 35 cycles and tolerated it well except for arthralgias.  His last dose of treatment was on 04/10/2022.  The patient recently had a restaging CT scan performed.  Dr. Arbutus Ped personally independently reviewed the scan and discussed the results with the patient today.  The scan did not show any evidence of disease progression.  Dr. Arbutus Ped recommends the patient continue on observation with a restaging CT scan in 3 months.  We will see him back for follow-up visit at that time to review his scan results.  I will arrange for his port-a-cath to be flushed in 6 weeks.   He will continue to follow with Dr. Barbaraann Cao regarding his history of metastatic disease to the brain. He is expected to have follow up brain MRI in June 2024.  He will continue to follow with cardiology.  He  will continue to follow with his PCP for his diabetes. I let him know it looks like his PCP sent prescriptions for metformin 500 mg tablets and melatonin to his pharmacy last week on 08/20/22. He was not aware of this and will pick these up.   The patient was advised to call immediately if she has any concerning symptoms in the interval. The patient voices understanding of current disease status and treatment options and is in agreement with the current care plan. All questions were answered. The patient knows to call the clinic with any problems, questions or concerns. We can certainly see the patient much sooner if necessary   Orders Placed This Encounter  Procedures   CT CHEST ABDOMEN PELVIS W CONTRAST    Standing Status:   Future    Standing Expiration Date:   08/27/2023    Order Specific Question:   If indicated for the ordered procedure, I authorize the  administration of contrast media per Radiology protocol    Answer:   Yes    Order Specific Question:   Does the patient have a contrast media/X-ray dye allergy?    Answer:   No    Order Specific Question:   Preferred imaging [facility]?    [facility]    Order Specific Question:   Is Oral Contrast requested for this exam?    Answer:   Yes, Per Radiology protocol   CBC with Differential (Cancer Center Only)    Standing Status:   Future    Standing Expiration Date:   08/27/2023   CMP (Cancer Center only)    Standing Status:   Future    Standing Expiration Date:   08/27/2023      Johnette AbrahamCassandra L Tationa Stech, PA-C 08/27/22  ADDENDUM: Hematology/Oncology Attending: I had a face-to-face encounter with the patient today.  I reviewed his record, lab and scan and recommended his care plan.  This is a very pleasant 67 years old African-American male with a stage IV non-small cell lung cancer, adenocarcinoma with PD-L1 expression of 70% and no actionable mutations.  He is status post right craniotomy with tumor resection followed by SRS to the brain metastasis followed by course of concurrent chemoradiation to the locally advanced disease in the chest.  He also had 2 years treatment with immunotherapy with Libtayo (Cempilimab) completed in November 2023. The patient is currently on observation and he is feeling fine with no concerning complaints.  He had repeat CT scan of the chest, abdomen and pelvis performed recently.  I personally and independently reviewed the scan and discussed the result with the patient today. His scan showed no concerning findings for disease progression. I recommended for him to continue on observation with repeat CT scan of the chest, abdomen and pelvis in 3 months. The patient was advised to call immediately if he has any concerning symptoms in the interval. The total time spent in the appointment was 20 minutes. Disclaimer: This note was dictated with voice  recognition software. Similar sounding words can inadvertently be transcribed and may be missed upon review. Lajuana MatteMohamed K Mohamed, MD

## 2022-08-24 ENCOUNTER — Ambulatory Visit: Payer: Medicare HMO | Attending: Internal Medicine | Admitting: Internal Medicine

## 2022-08-24 DIAGNOSIS — I700 Atherosclerosis of aorta: Secondary | ICD-10-CM

## 2022-08-24 DIAGNOSIS — E1169 Type 2 diabetes mellitus with other specified complication: Secondary | ICD-10-CM

## 2022-08-24 DIAGNOSIS — E785 Hyperlipidemia, unspecified: Secondary | ICD-10-CM

## 2022-08-24 DIAGNOSIS — E1159 Type 2 diabetes mellitus with other circulatory complications: Secondary | ICD-10-CM

## 2022-08-24 DIAGNOSIS — E119 Type 2 diabetes mellitus without complications: Secondary | ICD-10-CM

## 2022-08-24 DIAGNOSIS — Z720 Tobacco use: Secondary | ICD-10-CM

## 2022-08-24 DIAGNOSIS — I429 Cardiomyopathy, unspecified: Secondary | ICD-10-CM

## 2022-08-24 DIAGNOSIS — I493 Ventricular premature depolarization: Secondary | ICD-10-CM

## 2022-08-24 DIAGNOSIS — I152 Hypertension secondary to endocrine disorders: Secondary | ICD-10-CM

## 2022-08-24 DIAGNOSIS — I25118 Atherosclerotic heart disease of native coronary artery with other forms of angina pectoris: Secondary | ICD-10-CM

## 2022-08-27 ENCOUNTER — Other Ambulatory Visit: Payer: Self-pay

## 2022-08-27 ENCOUNTER — Inpatient Hospital Stay (HOSPITAL_BASED_OUTPATIENT_CLINIC_OR_DEPARTMENT_OTHER): Payer: Medicare HMO | Admitting: Physician Assistant

## 2022-08-27 VITALS — BP 146/87 | HR 78 | Temp 97.3°F | Resp 14 | Wt 170.6 lb

## 2022-08-27 DIAGNOSIS — Z79899 Other long term (current) drug therapy: Secondary | ICD-10-CM | POA: Diagnosis not present

## 2022-08-27 DIAGNOSIS — C7931 Secondary malignant neoplasm of brain: Secondary | ICD-10-CM | POA: Diagnosis not present

## 2022-08-27 DIAGNOSIS — C3490 Malignant neoplasm of unspecified part of unspecified bronchus or lung: Secondary | ICD-10-CM | POA: Diagnosis not present

## 2022-08-27 DIAGNOSIS — Z7982 Long term (current) use of aspirin: Secondary | ICD-10-CM | POA: Diagnosis not present

## 2022-08-27 DIAGNOSIS — Z7984 Long term (current) use of oral hypoglycemic drugs: Secondary | ICD-10-CM | POA: Diagnosis not present

## 2022-08-27 DIAGNOSIS — I10 Essential (primary) hypertension: Secondary | ICD-10-CM | POA: Diagnosis not present

## 2022-08-27 DIAGNOSIS — C3491 Malignant neoplasm of unspecified part of right bronchus or lung: Secondary | ICD-10-CM | POA: Diagnosis not present

## 2022-08-27 DIAGNOSIS — E119 Type 2 diabetes mellitus without complications: Secondary | ICD-10-CM | POA: Diagnosis not present

## 2022-08-27 DIAGNOSIS — C799 Secondary malignant neoplasm of unspecified site: Secondary | ICD-10-CM

## 2022-08-27 DIAGNOSIS — Z5112 Encounter for antineoplastic immunotherapy: Secondary | ICD-10-CM

## 2022-08-27 DIAGNOSIS — Z9221 Personal history of antineoplastic chemotherapy: Secondary | ICD-10-CM | POA: Diagnosis not present

## 2022-08-27 DIAGNOSIS — Z87891 Personal history of nicotine dependence: Secondary | ICD-10-CM | POA: Diagnosis not present

## 2022-09-03 ENCOUNTER — Ambulatory Visit (INDEPENDENT_AMBULATORY_CARE_PROVIDER_SITE_OTHER): Payer: Medicare HMO | Admitting: Internal Medicine

## 2022-09-03 ENCOUNTER — Encounter: Payer: Self-pay | Admitting: Internal Medicine

## 2022-09-03 ENCOUNTER — Other Ambulatory Visit: Payer: Self-pay

## 2022-09-03 VITALS — BP 139/70 | HR 85 | Temp 97.5°F | Ht 73.0 in | Wt 173.6 lb

## 2022-09-03 DIAGNOSIS — L299 Pruritus, unspecified: Secondary | ICD-10-CM | POA: Insufficient documentation

## 2022-09-03 MED ORDER — SARNA 0.5-0.5 % EX LOTN: 1.0000 | TOPICAL_LOTION | CUTANEOUS | 0 refills | Status: DC | PRN

## 2022-09-03 MED ORDER — HYDROXYZINE HCL 10 MG PO TABS: 10.0000 mg | ORAL_TABLET | Freq: Three times a day (TID) | ORAL | 0 refills | Status: DC | PRN

## 2022-09-03 NOTE — Assessment & Plan Note (Addendum)
Complains of pruritus for the past week. Reports it is located mostly around his joints such as ankles, wrists, elbows, shoulders, knees. Has not noticed it with small joints of hands or feet. States he notices it predominantly at night. He has not notices any rashes/skin findings. No bed bugs/insect bits, nor new soaps or detergents. Denies fevers or other systemic concerns. Patient concerned that his metformin or jardiance is contributing.  On exam patient is comfortable and does not itch throughout OV. Unable to appreciate any rashes or lesions. No findings in interdigital spaces, no plaques or patches on elbow creases. No petechiae, wheals, erythema, edema. Skin is not overtly dry either.  He recently had blood work done with normal t bili, no evidence of uremia, cbc was without elevation in eos. Not on any opioids, not should his jardiance or metformin be causing such a reaction. Furthermore, he has been on metformin some time and has tolerated it. There is no lesion that I can identify for possible biopsy either. I think for now we can treat conservatively with anti-itch Sarna lotion and hydroxyzine as needed. If he fails to improve, we may repeat blood work.

## 2022-09-03 NOTE — Patient Instructions (Signed)
Dear Mr. Hunt,  Thank you for trusting [place] with your care. We discussed your itching. It does not seem to be related any any liver, kidney, infectious, irritant, or medication side effect at this time.   We would like for you to use Sarna lotion and hydroxyzine as needed for the itching.   Please call us in 1 week to let us know how it is doing. If you are not better, we may repeat some blood work.

## 2022-09-03 NOTE — Progress Notes (Signed)
   CC: itching  HPI:[name] is a 67 y.o. male who presents for evaluation of itching. Please see individual problem based A/P for details.  66 yom hx of stage 4 non small cell carcinoma lung with mets to brain (seizure controlled with keppra), thyroid nodule, DM, bilateral OA,  Presents with complaints of itching 1 week.  Depression, PHQ-9: Based on the patients  Flowsheet Row Office Visit from 08/20/2022 in [facility]  PHQ-9 Total Score 5      score we have .  Past Medical History:  Diagnosis Date   Brain mass    Bursitis of right hip    Chest pain 08/12/2018   Chronic cough    Dependence on nicotine from cigarettes    Diabetes mellitus without complication    Dysuria 04/03/2021   Essential hypertension 07/28/2018   Frequent headaches 06/27/2020   nscl ca dx'd 12/2019   Polyarthralgia 06/15/2020   Seizures    Viral illness 03/23/2019   Review of Systems:   See hpi  Physical Exam: Vitals:   09/03/22 1113 09/03/22 1118  BP: (!) 154/77 139/70  Pulse: 88 85  Temp: (!) 97.5 F (36.4 C)   TempSrc: Oral   SpO2: 100%   Weight: 173 lb 9.6 oz (78.7 kg)   Height:  (1.854 m)    General: nad HEENT: Conjunctiva nl , antiicteric sclerae, moist mucous membranes, no exudate or erythema Cardiovascular: Normal rate, regular rhythm.  No murmurs, rubs, or gallops Pulmonary : Equal breath sounds, No wheezes, rales, or rhonchi Abdominal: soft, nontender,  bowel sounds present Ext: No edema in lower extremities, no tenderness to palpation of lower extremities.  Skin: clean and dry, without evidence of petiechia, purpura, wheals, excoriations, or xeroderma. No patches or plaques seen  Assessment & Plan:   See Encounters Tab for problem based charting.  Pruritus Complains of pruritus for the past week. Reports it is located mostly around his joints such as ankles, wrists, elbows, shoulders, knees. Has not noticed it with small joints of hands  or feet. States he notices it predominantly at night. He has not notices any rashes/skin findings. No bed bugs/insect bits, nor new soaps or detergents. Denies fevers or other systemic concerns. Patient concerned that his metformin or jardiance is contributing.  On exam patient is comfortable and does not itch throughout OV. Unable to appreciate any rashes or lesions. No findings in interdigital spaces, no plaques or patches on elbow creases. No petechiae, wheals, erythema, edema. Skin is not overtly dry either.  He recently had blood work done with normal t bili, no evidence of uremia, cbc was without elevation in eos. Not on any opioids, not should his jardiance or metformin be causing such a reaction. Furthermore, he has been on metformin some time and has tolerated it. There is no lesion that I can identify for possible biopsy either. I think for now we can treat conservatively with anti-itch Sarna lotion and hydroxyzine as needed. If he fails to improve, we may repeat blood work.    Patient discussed with Dr.  Lafonda Mosses

## 2022-09-06 NOTE — Progress Notes (Signed)
Internal Medicine Clinic Attending ° °Case discussed with Dr. Gawaluck  At the time of the visit.  We reviewed the resident’s history and exam and pertinent patient test results.  I agree with the assessment, diagnosis, and plan of care documented in the resident’s note.  °

## 2022-09-10 ENCOUNTER — Telehealth: Payer: Self-pay | Admitting: Internal Medicine

## 2022-09-10 NOTE — Telephone Encounter (Signed)
Pt c/o medication issue:  1. Name of Medication:  atorvastatin (LIPITOR) 20 MG tablet  isosorbide mononitrate (IMDUR) 30 MG 24 hr tablet  metoprolol succinate (TOPROL XL) 25 MG 24 hr tablet   2. How are you currently taking this medication (dosage and times per day)?   3. Are you having a reaction (difficulty breathing--STAT)?   4. What is your medication issue? Sarah with [facility] states she had called the pt, and was doing a med review, and states pt is very confused. She states that she doesn't think hes taking any of his heart medications except losartan, as he also doesn't think he has any heart problems. Maralyn Sago would like a c/b to her at [phone] as she thinks the call coming from her, would not confuse the pt as much.

## 2022-09-12 NOTE — Telephone Encounter (Signed)
I called and spoke w the patient.  We reviewed his medications.  His sister had been helping him w them but since he learned the names and doses he said he has been fixing his own pill box for a while.     He has all the medications except for the Jardiance.  I adv that Dr. Merrilyn Puma, one of his internal medicine doctors restarted this.  Adv the medication was sent to [facility] and he needs to pick it up.  He is going to call to make sure they have it ready and will add it to his regimen.

## 2022-09-13 ENCOUNTER — Telehealth: Payer: Self-pay | Admitting: *Deleted

## 2022-09-13 ENCOUNTER — Telehealth: Payer: Self-pay | Admitting: Internal Medicine

## 2022-09-13 NOTE — Telephone Encounter (Signed)
Pt sister called to get clarity on the phone call pt received yesterday and she would like a callback. She stated he was advised to start a medication again but it causes him to itch and have diarrhea so she doesn't think he should pick it up to start it again. Please advise

## 2022-09-13 NOTE — Telephone Encounter (Signed)
Patient's sister called in stating patient received call yesterday that he should be taking Jardiance. No note in Epic regarding this. Sister states that Jardiance caused the generalized itching and she does not want him taking this. A1c 5.7 on metformin. States he will continue metformin.

## 2022-09-13 NOTE — Telephone Encounter (Signed)
We discussed this with the patient and sister during their visit. Jardiance not felt to be responsible for his itching and we said he should continue it.  I will try to get in touch with her to discuss this again. Unfortunately, it seems as though she is pretty set on this

## 2022-09-13 NOTE — Telephone Encounter (Signed)
Please call sister back at work # [phone].

## 2022-09-13 NOTE — Telephone Encounter (Signed)
Spoke with the patient's sister who has questions about the patient starting Jardiance. I advised that she will need to call the patient's PCP who prescribed the medication.

## 2022-09-18 NOTE — Telephone Encounter (Signed)
Called patient's sister and home & work, but no answer. Left HIPAA compliant voicemail asking her to call our clinic and ask for Dr. Lafonda Mosses.   I also called patient, but he'd prefer for me to talk directly to his sister.

## 2022-09-20 ENCOUNTER — Encounter: Payer: Self-pay | Admitting: Internal Medicine

## 2022-09-20 NOTE — Telephone Encounter (Signed)
Patient sister called she wanted to give you her work number which is the best way  you can reach her  [phone]

## 2022-09-20 NOTE — Telephone Encounter (Signed)
Called patient's sister at requested number, but no answer. Left HIPAA compliant voicemail.

## 2022-09-20 NOTE — Telephone Encounter (Signed)
I was able to connect with patient's sister and we talked on the phone. Jardiance was causing Jared Shea diarrhea & itching. He stopped the medicine and his symptoms stopped. He does not want to keep taking it. I agree.  I added Jardiance as a med intolerance and have removed it from his med list.

## 2022-09-21 ENCOUNTER — Other Ambulatory Visit: Payer: Self-pay | Admitting: Internal Medicine

## 2022-10-10 ENCOUNTER — Inpatient Hospital Stay: Payer: Medicare HMO | Attending: Internal Medicine

## 2022-10-12 ENCOUNTER — Other Ambulatory Visit: Payer: Self-pay | Admitting: Student

## 2022-10-15 ENCOUNTER — Encounter: Payer: Self-pay | Admitting: Student

## 2022-10-15 ENCOUNTER — Ambulatory Visit (INDEPENDENT_AMBULATORY_CARE_PROVIDER_SITE_OTHER): Payer: Medicare HMO | Admitting: Student

## 2022-10-15 ENCOUNTER — Telehealth: Payer: Self-pay | Admitting: *Deleted

## 2022-10-15 ENCOUNTER — Other Ambulatory Visit: Payer: Self-pay | Admitting: Student

## 2022-10-15 ENCOUNTER — Other Ambulatory Visit: Payer: Self-pay

## 2022-10-15 VITALS — BP 125/72 | HR 87 | Temp 97.7°F | Ht 73.0 in | Wt 179.9 lb

## 2022-10-15 DIAGNOSIS — J22 Unspecified acute lower respiratory infection: Secondary | ICD-10-CM | POA: Diagnosis not present

## 2022-10-15 DIAGNOSIS — J069 Acute upper respiratory infection, unspecified: Secondary | ICD-10-CM | POA: Insufficient documentation

## 2022-10-15 MED ORDER — BENZONATATE 100 MG PO CAPS: 100.0000 mg | ORAL_CAPSULE | Freq: Four times a day (QID) | ORAL | 1 refills | Status: DC | PRN

## 2022-10-15 NOTE — Patient Instructions (Signed)
Thank you so much for coming to the clinic today!   We are testing you for COVID and Flu, I will call you with the results. In the meantime, try taking some tamiflu and tylenol to help control your symptoms. I am also sending in some anti cough medication you can take.   If you have any questions please feel free to the call the clinic at anytime at [phone]. It was a pleasure seeing you!  Best, Dr. Thomasene Ripple

## 2022-10-15 NOTE — Assessment & Plan Note (Addendum)
Patient presents with 2-day history of cough, runny nose, and worsening shortness of breath on exertion.  Patient does have a history of non-small cell lung cancer complicated by mets to the brain status post right craniotomy.  He states that the shortness of breath only occurs when he is walking long distances.  He denies any chest pain, nausea, vomiting, fevers, chills.  He does say that his son had the same symptoms few days ago.  On my physical exam, decreased breath sounds were heard especially on the left side, however no wheezes, rhonchi were appreciated.  No palpable lymph nodes appreciated, oral mucosa is nonerythematous.  Overall, patient may have a viral upper respiratory tract infection, however due to history of lung cancer we will test for COVID/FLU. If positive for COVID, pt would be candidate for paxlovid therapy. Low threshold to start antibiotics on him given medical history as well.   Plan:  - COVID/Flu/RSV test today  - Benzonatate  -  Encouraged patient to stay hydrated and continue supportive care with tylenol

## 2022-10-15 NOTE — Telephone Encounter (Signed)
Walk-in. Stated he has been coughing since yesterday; he's unsure if its allergies or has a cold. Also he's asking about refills on his meds - per chart, he has refills. I talked to Dr Naaman Plummer - Dr Nooruddin will see pt ; he and nurses were informed. I called [facility] who stated pt has refills. And for pt to bring the empty bottles so they will know what needs to be refills. I talked and asked pt to carry empty bottles to the pharmacy if he does not know the names.

## 2022-10-15 NOTE — Progress Notes (Signed)
CC: Cough  HPI:  Jared Shea. is a 67 y.o. male living with a history stated below and presents today for cough. Please see problem based assessment and plan for additional details.  Past Medical History:  Diagnosis Date   Brain mass    Bursitis of right hip    Chest pain 08/12/2018   Chronic cough    Dependence on nicotine from cigarettes    Diabetes mellitus without complication (HCC)    Dysuria 04/03/2021   Essential hypertension 07/28/2018   Frequent headaches 06/27/2020   nscl ca dx'd 12/2019   Polyarthralgia 06/15/2020   Seizures (HCC)    Viral illness 03/23/2019    Current Outpatient Medications on File Prior to Visit  Medication Sig Dispense Refill   albuterol (VENTOLIN HFA) 108 (90 Base) MCG/ACT inhaler Inhale 2 puffs into the lungs every 6 (six) hours as needed for wheezing or shortness of breath. 8 g 2   aspirin EC 81 MG tablet Take 1 tablet (81 mg total) by mouth daily. Swallow whole. 90 tablet 3   atorvastatin (LIPITOR) 20 MG tablet Take 1 tablet (20 mg total) by mouth daily. 90 tablet 3   blood glucose meter kit and supplies KIT Dispense based on patient and insurance preference. Use up to three times daily as directed. 1 each 0   camphor-menthol (SARNA) lotion Apply 1 Application topically as needed for itching. 222 mL 0   glucose blood (ONETOUCH VERIO) test strip TEST 3 TIMES DAILY 100 strip 11   hydrOXYzine (ATARAX) 10 MG tablet TAKE 1 TABLET(10 MG) BY MOUTH THREE TIMES DAILY AS NEEDED 30 tablet 0   isosorbide mononitrate (IMDUR) 30 MG 24 hr tablet Take 1 tablet (30 mg total) by mouth daily. 90 tablet 3   Lancets (ONETOUCH DELICA PLUS LANCET30G) MISC 3 (three) times daily. as directed     levETIRAcetam (KEPPRA) 750 MG tablet Take 1 tablet (750 mg total) by mouth 2 (two) times daily. 180 tablet 4   losartan (COZAAR) 25 MG tablet TAKE 1 TABLET (25 MG TOTAL) DAILY. 90 tablet 3   melatonin 5 MG TABS Take 1 tablet (5 mg total) by mouth at bedtime as needed  (for sleep). 30 tablet 0   metFORMIN (GLUCOPHAGE) 500 MG tablet Take 1 tablet (500 mg total) by mouth 2 (two) times daily with a meal. 120 tablet 2   metoprolol succinate (TOPROL XL) 25 MG 24 hr tablet Take 1 tablet (25 mg total) by mouth at bedtime. 90 tablet 3   No current facility-administered medications on file prior to visit.    Family History  Problem Relation Age of Onset   Heart disease Mother        CABG   Kidney disease Sister    Diabetes Brother    Kidney disease Brother        KIDNEY TRANSPLANT   Hypertension Sister    Healthy Daughter    Healthy Daughter    Healthy Son     Social History   Socioeconomic History   Marital status: Divorced    Spouse name: Not on file   Number of children: Not on file   Years of education: Not on file   Highest education level: Not on file  Occupational History   Not on file  Tobacco Use   Smoking status: Former    Packs/day: 1.00    Years: 48.00    Additional pack years: 0.00    Total pack years: 48.00    Types:  Cigarettes    Start date: 08/07/1970    Quit date: 12/15/2019    Years since quitting: 2.8   Smokeless tobacco: Never  Vaping Use   Vaping Use: Never used  Substance and Sexual Activity   Alcohol use: Not Currently   Drug use: Never   Sexual activity: Not on file    Comment: YES  Other Topics Concern   Not on file  Social History Narrative   Not on file   Social Determinants of Health   Financial Resource Strain: Low Risk  (09/03/2022)   Overall Financial Resource Strain (CARDIA)    Difficulty of Paying Living Expenses: Not very hard  Food Insecurity: No Food Insecurity (09/03/2022)   Hunger Vital Sign    Worried About Running Out of Food in the Last Year: Never true    Ran Out of Food in the Last Year: Never true  Transportation Needs: No Transportation Needs (09/03/2022)   PRAPARE - Administrator, Civil Service (Medical): No    Lack of Transportation (Non-Medical): No  Physical Activity:  Insufficiently Active (09/03/2022)   Exercise Vital Sign    Days of Exercise per Week: 3 days    Minutes of Exercise per Session: 30 min  Stress: No Stress Concern Present (09/03/2022)   [employer] of Occupational Health - Occupational Stress Questionnaire    Feeling of Stress : Not at all  Social Connections: Moderately Isolated (09/03/2022)   Social Connection and Isolation Panel [NHANES]    Frequency of Communication with Friends and Family: More than three times a week    Frequency of Social Gatherings with Friends and Family: Once a week    Attends Religious Services: More than 4 times per year    Active Member of [employer] or Organizations: No    Attends Banker Meetings: Never    Marital Status: Separated  Intimate Partner Violence: Not At Risk (09/03/2022)   Humiliation, Afraid, Rape, and Kick questionnaire    Fear of Current or Ex-Partner: No    Emotionally Abused: No    Physically Abused: No    Sexually Abused: No    Review of Systems: ROS negative except for what is noted on the assessment and plan.  Vitals:   10/15/22 1112  BP: 125/72  Pulse: 87  Temp: 97.7 F (36.5 C)  TempSrc: Oral  SpO2: 100%  Weight: 179 lb 14.4 oz (81.6 kg)  Height: 6\' 1"  (1.854 m)    Physical Exam: Constitutional: Elderly male, in no acute distress HENT: normocephalic atraumatic, mucous membranes moist, non-erythematous Eyes: conjunctiva non-erythematous Neck: supple Cardiovascular: regular rate and rhythm, no m/r/g Pulmonary/Chest: normal work of breathing on room air, decreased breath sounds, L>R. No wheezing heard Neurological: alert & oriented x 3, 5/5 strength in bilateral upper and lower extremities, normal gait   Assessment & Plan:   Upper respiratory infection Patient presents with 2-day history of cough, runny nose, and worsening shortness of breath on exertion.  Patient does have a history of non-small cell lung cancer complicated by mets to the brain status  post right craniotomy.  He states that the shortness of breath only occurs when he is walking long distances.  He denies any chest pain, nausea, vomiting, fevers, chills.  He does say that his son had the same symptoms few days ago.  On my physical exam, decreased breath sounds were heard especially on the left side, however no wheezes, rhonchi were appreciated.  No palpable lymph nodes appreciated, oral mucosa  is nonerythematous.  Overall, patient may have a viral upper respiratory tract infection, however due to history of lung cancer we will test for COVID/FLU. If positive for COVID, pt would be candidate for paxlovid therapy. Low threshold to start antibiotics on him given medical history as well.   Plan:  - COVID/Flu/RSV test today  - Benzonatate  -  Encouraged patient to stay hydrated and continue supportive care with tylenol   Patient discussed with Dr. Kae Heller Lola Czerwonka, M.D. [facility] Internal Medicine, PGY-1 Pager: [phone] Date 10/15/2022 Time 11:47 AM

## 2022-10-16 ENCOUNTER — Telehealth: Payer: Self-pay

## 2022-10-16 NOTE — Addendum Note (Signed)
Addended by: Burnell Blanks on: 10/16/2022 01:16 PM   Modules accepted: Level of Service

## 2022-10-16 NOTE — Progress Notes (Signed)
Internal Medicine Clinic Attending  Case discussed with Dr. Nooruddin  At the time of the visit.  We reviewed the resident's history and exam and pertinent patient test results.  I agree with the assessment, diagnosis, and plan of care documented in the resident's note.  

## 2022-10-16 NOTE — Telephone Encounter (Signed)
Pt is requesting a call back, with his covid results  from 6/3... I told him it looks like it is not back as of yet . But I will get a message through that when his  results are back please give him a call

## 2022-10-17 ENCOUNTER — Telehealth: Payer: Self-pay

## 2022-10-17 LAB — COVID-19, FLU A+B AND RSV
Influenza A, NAA: NOT DETECTED
Influenza B, NAA: NOT DETECTED
RSV, NAA: NOT DETECTED
SARS-CoV-2, NAA: NOT DETECTED

## 2022-10-17 NOTE — Telephone Encounter (Signed)
Requesting lab results, please call pt back.  

## 2022-10-19 ENCOUNTER — Ambulatory Visit (HOSPITAL_COMMUNITY): Payer: Medicare HMO | Attending: Internal Medicine

## 2022-10-19 DIAGNOSIS — E119 Type 2 diabetes mellitus without complications: Secondary | ICD-10-CM | POA: Insufficient documentation

## 2022-10-19 DIAGNOSIS — R072 Precordial pain: Secondary | ICD-10-CM | POA: Diagnosis not present

## 2022-10-19 DIAGNOSIS — R079 Chest pain, unspecified: Secondary | ICD-10-CM | POA: Diagnosis not present

## 2022-10-19 DIAGNOSIS — I700 Atherosclerosis of aorta: Secondary | ICD-10-CM | POA: Diagnosis not present

## 2022-10-19 DIAGNOSIS — I429 Cardiomyopathy, unspecified: Secondary | ICD-10-CM | POA: Insufficient documentation

## 2022-10-19 DIAGNOSIS — I2510 Atherosclerotic heart disease of native coronary artery without angina pectoris: Secondary | ICD-10-CM | POA: Diagnosis not present

## 2022-10-19 DIAGNOSIS — E1169 Type 2 diabetes mellitus with other specified complication: Secondary | ICD-10-CM | POA: Insufficient documentation

## 2022-10-19 DIAGNOSIS — E1159 Type 2 diabetes mellitus with other circulatory complications: Secondary | ICD-10-CM | POA: Insufficient documentation

## 2022-10-19 DIAGNOSIS — I493 Ventricular premature depolarization: Secondary | ICD-10-CM | POA: Insufficient documentation

## 2022-10-19 DIAGNOSIS — I152 Hypertension secondary to endocrine disorders: Secondary | ICD-10-CM | POA: Diagnosis not present

## 2022-10-19 DIAGNOSIS — E785 Hyperlipidemia, unspecified: Secondary | ICD-10-CM | POA: Diagnosis present

## 2022-10-19 LAB — ECHOCARDIOGRAM COMPLETE
Area-P 1/2: 2 cm2
P 1/2 time: 514 msec
S' Lateral: 3.5 cm

## 2022-11-02 ENCOUNTER — Telehealth: Payer: Self-pay | Admitting: Internal Medicine

## 2022-11-02 ENCOUNTER — Telehealth: Payer: Self-pay | Admitting: Medical Oncology

## 2022-11-02 MED ORDER — HYDROXYZINE HCL 10 MG PO TABS: ORAL_TABLET | ORAL | 0 refills | Status: DC

## 2022-11-02 MED ORDER — SARNA 0.5-0.5 % EX LOTN: 1.0000 | TOPICAL_LOTION | CUTANEOUS | 0 refills | Status: DC | PRN

## 2022-11-02 NOTE — Telephone Encounter (Signed)
Received notification from operator that patient was requesting call back. I contacted [name]. Who explained that he is hoping to have medication sent in for body itching. He explains that he thinks it may be due to one of his medications but he doesn't know which one. It comes in on the morning after he takes his morning medications--metformin, keppra, losartan, ASA. None of these are new medications for him. He denies hives, difficulty breathing or swallowing, lip swelling, or tongue swelling associated with the itching. He has had this complaint before (last noted in our office in April 2024) and says that the itching now is the same as prior. Of note at that office visit, his exam was largely unremarkable with no evidence of rash or abnormal skin findings including on extensor surfaces and in the interdigital spaces. Labs at that time were unremarkable. He was prescribed a short course of PRN hydroxyzine and sarna lotion which he does say helped relieve his symptoms.   I have advised [name]. To immediately seek care at the ER if he develops hives, difficulty breathing, tongue or lip swelling. In the meantime I have sent in an additional short course of hydroxyzine and sarna lotion. I will reach out to the front desk to arrange a follow up visit.   Jared Mungo, DO Internal Medicine PGY-2

## 2022-11-02 NOTE — Telephone Encounter (Signed)
MRI brain - She will call pt to schedule for first week of July .

## 2022-11-05 ENCOUNTER — Inpatient Hospital Stay: Payer: Medicare HMO

## 2022-11-06 ENCOUNTER — Other Ambulatory Visit: Payer: Medicare HMO

## 2022-11-06 ENCOUNTER — Telehealth: Payer: Self-pay | Admitting: Physician Assistant

## 2022-11-06 NOTE — Telephone Encounter (Signed)
Called patient regarding July appointments, patient is notified.  

## 2022-11-07 ENCOUNTER — Other Ambulatory Visit: Payer: Self-pay

## 2022-11-08 ENCOUNTER — Inpatient Hospital Stay: Payer: Medicare HMO

## 2022-11-08 ENCOUNTER — Inpatient Hospital Stay: Payer: Medicare HMO | Admitting: Internal Medicine

## 2022-11-08 MED ORDER — BENZONATATE 100 MG PO CAPS: 100.0000 mg | ORAL_CAPSULE | Freq: Three times a day (TID) | ORAL | 0 refills | Status: DC | PRN

## 2022-11-09 ENCOUNTER — Other Ambulatory Visit: Payer: Self-pay | Admitting: *Deleted

## 2022-11-09 ENCOUNTER — Other Ambulatory Visit: Payer: Self-pay

## 2022-11-09 DIAGNOSIS — E1369 Other specified diabetes mellitus with other specified complication: Secondary | ICD-10-CM

## 2022-11-09 DIAGNOSIS — G40209 Localization-related (focal) (partial) symptomatic epilepsy and epileptic syndromes with complex partial seizures, not intractable, without status epilepticus: Secondary | ICD-10-CM

## 2022-11-09 DIAGNOSIS — E119 Type 2 diabetes mellitus without complications: Secondary | ICD-10-CM

## 2022-11-09 MED ORDER — ONETOUCH VERIO VI STRP: ORAL_STRIP | 11 refills | Status: DC

## 2022-11-09 MED ORDER — ONETOUCH VERIO W/DEVICE KIT: PACK | 0 refills | Status: DC

## 2022-11-09 MED ORDER — BLOOD GLUCOSE MONITOR KIT: PACK | 0 refills | Status: DC

## 2022-11-09 MED ORDER — ONETOUCH DELICA PLUS LANCET30G MISC: 11 refills | Status: DC

## 2022-11-09 MED ORDER — BLOOD GLUCOSE METER KIT: PACK | 0 refills | Status: DC

## 2022-11-09 NOTE — Addendum Note (Signed)
Addended by: Rana Snare on: 11/09/2022 02:20 PM   Modules accepted: Orders

## 2022-11-09 NOTE — Telephone Encounter (Signed)
Meter was written as "Print"; re-send electronically. Thanks

## 2022-11-09 NOTE — Addendum Note (Signed)
Addended by: Rana Snare on: 11/09/2022 02:40 PM   Modules accepted: Orders

## 2022-11-09 NOTE — Telephone Encounter (Signed)
Pt stated he lost his meter; requesting a new meter. Stated he has not checked his BS's "in a while".

## 2022-11-09 NOTE — Telephone Encounter (Signed)
Requesting new meter, test strips and lancets. Please call pt back.

## 2022-11-12 ENCOUNTER — Other Ambulatory Visit: Payer: Self-pay | Admitting: Physician Assistant

## 2022-11-12 ENCOUNTER — Other Ambulatory Visit: Payer: Self-pay

## 2022-11-12 MED ORDER — GLUCOSE BLOOD VI STRP: ORAL_STRIP | 12 refills | Status: DC

## 2022-11-12 MED ORDER — ACCU-CHEK GUIDE W/DEVICE KIT: PACK | 0 refills | Status: DC

## 2022-11-12 NOTE — Telephone Encounter (Addendum)
Incoming fax from pharmacy Medication: One touch verio meter kit and One touch verio test strips Message to prescriber: "plan does not cover medication prescribed. Per rx benefit plan alternative medications include ACCU-CHECK".

## 2022-11-12 NOTE — Addendum Note (Signed)
Addended by: Rana Snare on: 11/12/2022 04:52 PM   Modules accepted: Orders

## 2022-11-14 ENCOUNTER — Telehealth: Payer: Self-pay | Admitting: *Deleted

## 2022-11-14 ENCOUNTER — Inpatient Hospital Stay: Admission: RE | Admit: 2022-11-14 | Payer: Medicare HMO | Source: Ambulatory Visit

## 2022-11-14 NOTE — Telephone Encounter (Signed)
PC to patient's sister Cira Rue, informed her patient has MRI scheduled at [facility] on 11/17/22 at 11:00, he is to arrive at 10:30.  She verbalizes understanding.

## 2022-11-17 ENCOUNTER — Ambulatory Visit (HOSPITAL_COMMUNITY)
Admission: RE | Admit: 2022-11-17 | Discharge: 2022-11-17 | Disposition: A | Discharge status: Home / Self Care | Payer: Medicare HMO | Source: Ambulatory Visit | Attending: Internal Medicine | Admitting: Internal Medicine

## 2022-11-17 DIAGNOSIS — G9389 Other specified disorders of brain: Secondary | ICD-10-CM | POA: Diagnosis not present

## 2022-11-17 DIAGNOSIS — R9089 Other abnormal findings on diagnostic imaging of central nervous system: Secondary | ICD-10-CM | POA: Diagnosis not present

## 2022-11-17 DIAGNOSIS — C7931 Secondary malignant neoplasm of brain: Secondary | ICD-10-CM | POA: Insufficient documentation

## 2022-11-17 DIAGNOSIS — C799 Secondary malignant neoplasm of unspecified site: Secondary | ICD-10-CM | POA: Diagnosis not present

## 2022-11-17 MED ORDER — HEPARIN SOD (PORK) LOCK FLUSH 100 UNIT/ML IV SOLN
500.0000 [IU] | INTRAVENOUS | Status: AC | PRN
  Administered 2022-11-17: 500 [IU]
  Filled 2022-11-17: qty 5

## 2022-11-17 MED ORDER — GADOBUTROL 1 MMOL/ML IV SOLN
8.0000 mL | Freq: Once | INTRAVENOUS | Status: AC | PRN
  Administered 2022-11-17: 8 mL via INTRAVENOUS

## 2022-11-19 ENCOUNTER — Ambulatory Visit
Admission: RE | Admit: 2022-11-19 | Discharge: 2022-11-19 | Disposition: A | Discharge status: Home / Self Care | Payer: Medicare HMO | Source: Ambulatory Visit | Attending: Physician Assistant | Admitting: Physician Assistant

## 2022-11-19 ENCOUNTER — Encounter: Payer: Self-pay | Admitting: Internal Medicine

## 2022-11-19 DIAGNOSIS — C799 Secondary malignant neoplasm of unspecified site: Secondary | ICD-10-CM

## 2022-11-19 DIAGNOSIS — C7800 Secondary malignant neoplasm of unspecified lung: Secondary | ICD-10-CM | POA: Diagnosis not present

## 2022-11-19 DIAGNOSIS — J439 Emphysema, unspecified: Secondary | ICD-10-CM | POA: Diagnosis not present

## 2022-11-19 DIAGNOSIS — I700 Atherosclerosis of aorta: Secondary | ICD-10-CM | POA: Diagnosis not present

## 2022-11-19 MED ORDER — HEPARIN SOD (PORK) LOCK FLUSH 100 UNIT/ML IV SOLN
500.0000 [IU] | Freq: Once | INTRAVENOUS | Status: AC
  Administered 2022-11-19: 500 [IU] via INTRAVENOUS

## 2022-11-19 MED ORDER — IOPAMIDOL (ISOVUE-300) INJECTION 61%
100.0000 mL | Freq: Once | INTRAVENOUS | Status: AC | PRN
  Administered 2022-11-19: 100 mL via INTRAVENOUS

## 2022-11-19 MED ORDER — SODIUM CHLORIDE 0.9% FLUSH
10.0000 mL | INTRAVENOUS | Status: DC | PRN
  Administered 2022-11-19: 10 mL via INTRAVENOUS

## 2022-11-20 ENCOUNTER — Ambulatory Visit: Payer: Medicare HMO | Admitting: Student

## 2022-11-20 ENCOUNTER — Encounter: Payer: Self-pay | Admitting: Student

## 2022-11-20 VITALS — BP 136/63 | HR 72 | Temp 97.5°F | Ht 73.0 in | Wt 182.0 lb

## 2022-11-20 DIAGNOSIS — E119 Type 2 diabetes mellitus without complications: Secondary | ICD-10-CM | POA: Diagnosis not present

## 2022-11-20 DIAGNOSIS — L299 Pruritus, unspecified: Secondary | ICD-10-CM | POA: Diagnosis not present

## 2022-11-20 DIAGNOSIS — Z7984 Long term (current) use of oral hypoglycemic drugs: Secondary | ICD-10-CM | POA: Diagnosis not present

## 2022-11-20 NOTE — Assessment & Plan Note (Signed)
>>  ASSESSMENT AND PLAN FOR UNCONTROLLED TYPE 2 DIABETES MELLITUS WITH HYPERGLYCEMIA, WITHOUT LONG-TERM CURRENT USE OF INSULIN  (HCC) WRITTEN ON 11/20/2022  1:53 PM BY Graysyn Bache, DO  A1c 5.7% in April, unchanged from previous. Taking metformin  500 mg BID. Was restarted on Jardiance  in April OV but stopped about 1 month ago due to patient concern for pruritus with starting it. Pruritus improving and no rashes noted. Plan to recheck A1c about every 6 months given good glycemic control.  Plan -Continue metformin  500 mg BID -Recheck A1c in ~3 months

## 2022-11-20 NOTE — Progress Notes (Signed)
CC: pruritus   HPI:  [name] is a 67 y.o. male living with a history stated below and presents today for pruritus. Please see problem based assessment and plan for additional details.  Past Medical History:  Diagnosis Date   Brain mass    Bursitis of right hip    Chest pain 08/12/2018   Chronic cough    Dependence on nicotine from cigarettes    Diabetes mellitus without complication (HCC)    Dysuria 04/03/2021   Essential hypertension 07/28/2018   Frequent headaches 06/27/2020   nscl ca dx'd 12/2019   Polyarthralgia 06/15/2020   Seizures (HCC)    Viral illness 03/23/2019    Current Outpatient Medications on File Prior to Visit  Medication Sig Dispense Refill   albuterol (VENTOLIN HFA) 108 (90 Base) MCG/ACT inhaler Inhale 2 puffs into the lungs every 6 (six) hours as needed for wheezing or shortness of breath. 8 g 2   aspirin EC 81 MG tablet Take 1 tablet (81 mg total) by mouth daily. Swallow whole. 90 tablet 3   atorvastatin (LIPITOR) 20 MG tablet Take 1 tablet (20 mg total) by mouth daily. 90 tablet 3   benzonatate (TESSALON PERLES) 100 MG capsule Take 1 capsule (100 mg total) by mouth 3 (three) times daily as needed for cough. 30 capsule 0   Blood Glucose Monitoring Suppl (ACCU-CHEK GUIDE) w/Device KIT Use glucose meter to check your blood sugar. 1 kit 0   camphor-menthol (SARNA) lotion Apply 1 Application topically as needed for itching. 222 mL 0   glucose blood test strip Use a new test strip each time to check your blood sugar. 100 each 12   hydrOXYzine (ATARAX) 10 MG tablet TAKE 1 TABLET(10 MG) BY MOUTH THREE TIMES DAILY AS NEEDED 30 tablet 0   isosorbide mononitrate (IMDUR) 30 MG 24 hr tablet Take 1 tablet (30 mg total) by mouth daily. 90 tablet 3   Lancets (ONETOUCH DELICA PLUS LANCET30G) MISC Use a new lancet each time to check your blood sugar. 100 each 11   levETIRAcetam (KEPPRA) 750 MG tablet Take 1 tablet (750 mg total) by mouth 2 (two) times daily. 180  tablet 4   losartan (COZAAR) 25 MG tablet TAKE 1 TABLET (25 MG TOTAL) DAILY. 90 tablet 3   melatonin 5 MG TABS Take 1 tablet (5 mg total) by mouth at bedtime as needed (for sleep). 30 tablet 0   metFORMIN (GLUCOPHAGE) 500 MG tablet Take 1 tablet (500 mg total) by mouth 2 (two) times daily with a meal. 120 tablet 2   metoprolol succinate (TOPROL XL) 25 MG 24 hr tablet Take 1 tablet (25 mg total) by mouth at bedtime. 90 tablet 3   No current facility-administered medications on file prior to visit.   Review of Systems: ROS negative except for what is noted on the assessment and plan.  Vitals:   11/20/22 0845  BP: 136/63  Pulse: 72  Temp: (!) 97.5 F (36.4 C)  TempSrc: Oral  SpO2: 100%  Weight: 182 lb (82.6 kg)  Height: 6\' 1"  (1.854 m)   Physical Exam: Constitutional: well-appearing male sitting in chair comfortably, in no acute distress Cardiovascular: regular rate Pulmonary/Chest: normal work of breathing on room air, lungs clear to auscultation bilaterally Neurological: alert & oriented x 3 Skin: warm and dry, no rashes, lesions, wheals, hives, erythema over bilateral UE and LE and abdomen Psych: pleasant mood  Assessment & Plan:   Diabetes mellitus (HCC) A1c 5.7% in April, unchanged  from previous. Taking metformin 500 mg BID. Was restarted on Jardiance in April OV but stopped about 1 month ago due to patient concern for pruritus with starting it. Pruritus improving and no rashes noted. Plan to recheck A1c about every 6 months given good glycemic control.  Plan -Continue metformin 500 mg BID -Recheck A1c in ~3 months  Pruritus Presents today for recurrent pruritus that was noted in April. No findings at OV on 4/22, sent in Sarna lotion and hydroxyzine PRN. Patient thought itching from recently starting Jardiance and has since stopped taking it for past month. He states itching has improved and less frequent. States itching around abdomen and LE. Denies any rash, insect bites,  new detergents or medications. No other changes compared to OV on 4/22. Exam does not show any lesions, hives, erythema, edema or overtly dry skin over UE, LE and abdomen. He has been taking hydroxyzine PRN and has not required much doses recently after stopping Jardiance. Blood work then showed normal bilirubin, kidney function, normal eosinophil levels. No findings to suggest need for skin biopsy at this time. Discussed checking TSH today, patient declined any blood work today. Given pruritus is improving per patient, reassured patient, can reassess/further workup if symptoms worsen.   Plan -Monitor for any worsening signs/symptoms -Consider adding on cetirizine daily or even BID if needed for worsening pruritus over hydroxyzine    Patient discussed with Dr. Docia Furl, D.O. [facility] Internal Medicine, PGY-2 Phone: [phone] Date 11/20/2022 Time 5:35 PM

## 2022-11-20 NOTE — Patient Instructions (Addendum)
Thank you, [name] for allowing [place] to provide your care today. Today we discussed your itching.  -I am glad the itching has improved and less frequent and severe. Do not continue the Jardiance.  -Only use the hydroxyzine as needed if severe itching.  -I anticipate the itching will resolve soon. If not, we can further evaluate it.    -Appears you have refills of the metformin at your pharmacy already.   I have ordered the following labs for you:  Lab Orders         TSH        Follow up: 2-3 months    Should you have any questions or concerns please call the internal medicine clinic at [phone].    Rana Snare, D.O. [facility]

## 2022-11-20 NOTE — Assessment & Plan Note (Signed)
A1c 5.7% in April, unchanged from previous. Taking metformin 500 mg BID. Was restarted on Jardiance in April OV but stopped about 1 month ago due to patient concern for pruritus with starting it. Pruritus improving and no rashes noted. Plan to recheck A1c about every 6 months given good glycemic control.  Plan -Continue metformin 500 mg BID -Recheck A1c in ~3 months

## 2022-11-20 NOTE — Assessment & Plan Note (Addendum)
Presents today for recurrent pruritus that was noted in April. No findings at OV on 4/22, sent in Sarna lotion and hydroxyzine PRN. Patient thought itching from recently starting Jardiance and has since stopped taking it for past month. He states itching has improved and less frequent. States itching around abdomen and LE. Denies any rash, insect bites, new detergents or medications. No other changes compared to OV on 4/22. Exam does not show any lesions, hives, erythema, edema or overtly dry skin over UE, LE and abdomen. He has been taking hydroxyzine PRN and has not required much doses recently after stopping Jardiance. Blood work then showed normal bilirubin, kidney function, normal eosinophil levels. No findings to suggest need for skin biopsy at this time. Discussed checking TSH today, patient declined any blood work today. Given pruritus is improving per patient, reassured patient, can reassess/further workup if symptoms worsen.   Plan -Monitor for any worsening signs/symptoms -Consider adding on cetirizine daily or even BID if needed for worsening pruritus over hydroxyzine

## 2022-11-22 ENCOUNTER — Inpatient Hospital Stay: Payer: Medicare HMO | Attending: Internal Medicine

## 2022-11-22 ENCOUNTER — Inpatient Hospital Stay (HOSPITAL_BASED_OUTPATIENT_CLINIC_OR_DEPARTMENT_OTHER): Payer: Medicare HMO | Admitting: Internal Medicine

## 2022-11-22 ENCOUNTER — Other Ambulatory Visit: Payer: Self-pay

## 2022-11-22 VITALS — BP 148/69 | HR 72 | Temp 97.8°F | Resp 20 | Wt 179.2 lb

## 2022-11-22 DIAGNOSIS — C3490 Malignant neoplasm of unspecified part of unspecified bronchus or lung: Secondary | ICD-10-CM | POA: Diagnosis not present

## 2022-11-22 DIAGNOSIS — C7931 Secondary malignant neoplasm of brain: Secondary | ICD-10-CM | POA: Insufficient documentation

## 2022-11-22 DIAGNOSIS — G40209 Localization-related (focal) (partial) symptomatic epilepsy and epileptic syndromes with complex partial seizures, not intractable, without status epilepticus: Secondary | ICD-10-CM

## 2022-11-22 DIAGNOSIS — Z923 Personal history of irradiation: Secondary | ICD-10-CM | POA: Diagnosis not present

## 2022-11-22 DIAGNOSIS — Z87891 Personal history of nicotine dependence: Secondary | ICD-10-CM | POA: Insufficient documentation

## 2022-11-22 DIAGNOSIS — Z95828 Presence of other vascular implants and grafts: Secondary | ICD-10-CM

## 2022-11-22 DIAGNOSIS — C799 Secondary malignant neoplasm of unspecified site: Secondary | ICD-10-CM

## 2022-11-22 DIAGNOSIS — Z9221 Personal history of antineoplastic chemotherapy: Secondary | ICD-10-CM | POA: Diagnosis not present

## 2022-11-22 LAB — CMP (CANCER CENTER ONLY)
ALT: 25 U/L (ref 0–44)
AST: 24 U/L (ref 15–41)
Albumin: 4.2 g/dL (ref 3.5–5.0)
Alkaline Phosphatase: 72 U/L (ref 38–126)
Anion gap: 9 (ref 5–15)
BUN: 14 mg/dL (ref 8–23)
CO2: 25 mmol/L (ref 22–32)
Calcium: 9 mg/dL (ref 8.9–10.3)
Chloride: 107 mmol/L (ref 98–111)
Creatinine: 0.74 mg/dL (ref 0.61–1.24)
GFR, Estimated: >60 mL/min (ref >60)
Glucose, Bld: 239 mg/dL — ABNORMAL HIGH (ref 70–99)
Potassium: 3.7 mmol/L (ref 3.5–5.1)
Sodium: 141 mmol/L (ref 135–145)
Total Bilirubin: 0.7 mg/dL (ref 0.3–1.2)
Total Protein: 7.5 g/dL (ref 6.5–8.1)

## 2022-11-22 LAB — CBC WITH DIFFERENTIAL (CANCER CENTER ONLY)
Abs Immature Granulocytes: 0.01 10*3/uL (ref 0.00–0.07)
Basophils Absolute: 0 10*3/uL (ref 0.0–0.1)
Basophils Relative: 1 %
Eosinophils Absolute: 0.1 10*3/uL (ref 0.0–0.5)
Eosinophils Relative: 2 %
HCT: 36.2 % — ABNORMAL LOW (ref 39.0–52.0)
Hemoglobin: 12.2 g/dL — ABNORMAL LOW (ref 13.0–17.0)
Immature Granulocytes: 0 %
Lymphocytes Relative: 26 %
Lymphs Abs: 1.7 10*3/uL (ref 0.7–4.0)
MCH: 29.2 pg (ref 26.0–34.0)
MCHC: 33.7 g/dL (ref 30.0–36.0)
MCV: 86.6 fL (ref 80.0–100.0)
Monocytes Absolute: 0.5 10*3/uL (ref 0.1–1.0)
Monocytes Relative: 7 %
Neutro Abs: 4.1 10*3/uL (ref 1.7–7.7)
Neutrophils Relative %: 64 %
Platelet Count: 310 10*3/uL (ref 150–400)
RBC: 4.18 MIL/uL — ABNORMAL LOW (ref 4.22–5.81)
RDW: 18.2 % — ABNORMAL HIGH (ref 11.5–15.5)
WBC Count: 6.4 10*3/uL (ref 4.0–10.5)
nRBC: 0 % (ref 0.0–0.2)

## 2022-11-22 MED ORDER — HEPARIN SOD (PORK) LOCK FLUSH 100 UNIT/ML IV SOLN
500.0000 [IU] | Freq: Once | INTRAVENOUS | Status: AC
  Administered 2022-11-22: 500 [IU]

## 2022-11-22 MED ORDER — SODIUM CHLORIDE 0.9% FLUSH
10.0000 mL | Freq: Once | INTRAVENOUS | Status: AC
  Administered 2022-11-22: 10 mL

## 2022-11-22 NOTE — Progress Notes (Signed)
[facility] [address]  [place], [place] [address] [phone]  Interval Evaluation  Date of Service: 11/22/22 Patient Name: Jared Shea. Patient MRN: [id number] Patient DOB: [birth year] Provider: Henreitta Leber, MD  Identifying Statement:  Jared Shea. is a 67 y.o. male with Malignant neoplasm metastatic to brain [facility]) [C79.31]   Primary Cancer:  Oncologic History: Oncology History  Non-small cell carcinoma of lung, stage 4 w/ mets to brain (HCC)  12/31/2019 Initial Diagnosis   Non-small cell carcinoma of lung, stage 4 w/ mets to brain (HCC)   06/06/2020 Cancer Staging   Staging form: Lung, AJCC 8th Edition - Clinical: Stage IVB (cT0, cN2, cM1c) - Signed by Si Gaul, MD on 06/06/2020   Adenocarcinoma, metastatic (HCC)  01/07/2020 Initial Diagnosis   Adenocarcinoma, metastatic (HCC)   02/01/2020 - 03/07/2020 Chemotherapy   The patient had dexamethasone (DECADRON) 4 MG tablet, 8 mg, Oral, Daily, 1 of 1 cycle, Start date: --, End date: -- palonosetron (ALOXI) injection 0.25 mg, 0.25 mg, Intravenous,  Once, 5 of 7 cycles Administration: 0.25 mg (02/01/2020), 0.25 mg (02/08/2020), 0.25 mg (03/07/2020), 0.25 mg (02/15/2020), 0.25 mg (02/22/2020) CARBOplatin (PARAPLATIN) 220 mg in sodium chloride 0.9 % 250 mL chemo infusion, 220 mg (100 % of original dose 216.8 mg), Intravenous,  Once, 5 of 7 cycles Dose modification: 216.8 mg (original dose 216.8 mg, Cycle 1) Administration: 220 mg (02/01/2020), 220 mg (02/08/2020), 220 mg (03/07/2020), 220 mg (02/15/2020), 220 mg (02/22/2020) PACLitaxel (TAXOL) 90 mg in sodium chloride 0.9 % 250 mL chemo infusion (</= 80mg /m2), 45 mg/m2 = 90 mg, Intravenous,  Once, 5 of 7 cycles Administration: 90 mg (02/01/2020), 90 mg (02/08/2020), 90 mg (03/07/2020), 90 mg (02/15/2020), 90 mg (02/22/2020)  for chemotherapy treatment.    04/25/2020 - 04/10/2022 Chemotherapy   Patient is on Treatment Plan : LUNG NSCLC  Cemiplimab q21d      CNS Oncologic History 01/05/20: Pre-op SRS Jared Shea) 01/07/20: Craniotomy, R frontal resection (Jared Shea) 04/22/20: Salvage SRS to 3 targets   Interval History:  Jared Shea. presents today for follow up after brain MRI.  Denies clinical changes since prior visit, no further seizures.  Does have some issues with his short term memory.  No changse in his gait.  Now on observation with Jared Shea.  Would like to return to work.  H+P (04/18/20) Patient presents following recent brain MRI.  He had completed pre-op SRS and underwent craniotomy for R frontal metastasis, after initial presenting with focal seizures (facial twitching).  He denies focal neurologic complaints today, he is functional and independent.  He is undergoing chemotherapy with Jared Shea (completed [place] and taxol) without complication.  His gait is unassisted.  Denies any seizures following his initial presentation, compliant with Jared Shea.      Medications: Current Outpatient Medications on File Prior to Visit  Medication Sig Dispense Refill   albuterol (VENTOLIN HFA) 108 (90 Base) MCG/ACT inhaler Inhale 2 puffs into the lungs every 6 (six) hours as needed for wheezing or shortness of breath. 8 g 2   aspirin EC 81 MG tablet Take 1 tablet (81 mg total) by mouth daily. Swallow whole. 90 tablet 3   atorvastatin (LIPITOR) 20 MG tablet Take 1 tablet (20 mg total) by mouth daily. 90 tablet 3   benzonatate (TESSALON PERLES) 100 MG capsule Take 1 capsule (100 mg total) by mouth 3 (three) times daily as needed for cough. 30 capsule 0   Blood Glucose Monitoring  Suppl (ACCU-CHEK GUIDE) w/Device KIT Use glucose meter to check your blood sugar. 1 kit 0   camphor-menthol (SARNA) lotion Apply 1 Application topically as needed for itching. 222 mL 0   glucose blood test strip Use a new test strip each time to check your blood sugar. 100 each 12   hydrOXYzine (ATARAX) 10 MG tablet TAKE 1 TABLET(10 MG) BY MOUTH THREE TIMES  DAILY AS NEEDED 30 tablet 0   isosorbide mononitrate (IMDUR) 30 MG 24 hr tablet Take 1 tablet (30 mg total) by mouth daily. 90 tablet 3   Lancets (ONETOUCH DELICA PLUS LANCET30G) MISC Use a new lancet each time to check your blood sugar. 100 each 11   levETIRAcetam (Jared Shea) 750 MG tablet Take 1 tablet (750 mg total) by mouth 2 (two) times daily. 180 tablet 4   losartan (COZAAR) 25 MG tablet TAKE 1 TABLET (25 MG TOTAL) DAILY. 90 tablet 3   melatonin 5 MG TABS Take 1 tablet (5 mg total) by mouth at bedtime as needed (for sleep). 30 tablet 0   metFORMIN (GLUCOPHAGE) 500 MG tablet Take 1 tablet (500 mg total) by mouth 2 (two) times daily with a meal. 120 tablet 2   metoprolol succinate (TOPROL XL) 25 MG 24 hr tablet Take 1 tablet (25 mg total) by mouth at bedtime. 90 tablet 3   No current facility-administered medications on file prior to visit.    Allergies:  Allergies  Allergen Reactions   Jardiance [Empagliflozin]     Caused diarrhea & itching, so we stopped   Penicillins Rash    Did it involve swelling of the face/tongue/throat, SOB, or low BP? N Did it involve sudden or severe rash/hives, skin peeling, or any reaction on the inside of your mouth or nose? Y Did you need to seek medical attention at a hospital or doctor's office? Y When did it last happen?    childhood   If all above answers are "NO", may proceed with cephalosporin use.    Past Medical History:  Past Medical History:  Diagnosis Date   Brain mass    Bursitis of right hip    Chest pain 08/12/2018   Chronic cough    Dependence on nicotine from cigarettes    Diabetes mellitus without complication (HCC)    Dysuria 04/03/2021   Essential hypertension 07/28/2018   Frequent headaches 06/27/2020   nscl ca dx'd 12/2019   Polyarthralgia 06/15/2020   Seizures (HCC)    Viral illness 03/23/2019   Past Surgical History:  Past Surgical History:  Procedure Laterality Date   APPLICATION OF CRANIAL NAVIGATION N/A 01/07/2020    Procedure: APPLICATION OF CRANIAL NAVIGATION;  Surgeon: Coletta Memos, MD;  Location: [facility] OR;  Service: Neurosurgery;  Laterality: N/A;   CRANIOTOMY Right 01/07/2020   Procedure: RIGHT CRANIOTOMY FOR TUMOR RESECTION;  Surgeon: Coletta Memos, MD;  Location: [facility] OR;  Service: Neurosurgery;  Laterality: Right;  rm 21   ENDOBRONCHIAL ULTRASOUND N/A 12/21/2019   Procedure: ENDOBRONCHIAL ULTRASOUND;  Surgeon: Tomma Lightning, MD;  Location: [facility] ENDOSCOPY;  Service: Pulmonary;  Laterality: N/A;   FINE NEEDLE ASPIRATION  12/21/2019   Procedure: FINE NEEDLE ASPIRATION (FNA) LINEAR;  Surgeon: Tomma Lightning, MD;  Location: [facility] ENDOSCOPY;  Service: Pulmonary;;   IR IMAGING GUIDED PORT INSERTION  03/03/2020   NO PAST SURGERIES     VIDEO BRONCHOSCOPY N/A 12/21/2019   Procedure: VIDEO BRONCHOSCOPY WITHOUT FLUORO;  Surgeon: Tomma Lightning, MD;  Location: [facility] ENDOSCOPY;  Service: Pulmonary;  Laterality:  N/A;   Social History:  Social History   Socioeconomic History   Marital status: Divorced    Spouse name: Not on file   Number of children: Not on file   Years of education: Not on file   Highest education level: Not on file  Occupational History   Not on file  Tobacco Use   Smoking status: Former    Current packs/day: 0.00    Average packs/day: 1 pack/day for 49.4 years (49.4 ttl pk-yrs)    Types: Cigarettes    Start date: 08/07/1970    Quit date: 12/15/2019    Years since quitting: 2.9   Smokeless tobacco: Never  Vaping Use   Vaping status: Never Used  Substance and Sexual Activity   Alcohol use: Not Currently   Drug use: Never   Sexual activity: Not on file    Comment: YES  Other Topics Concern   Not on file  Social History Narrative   Not on file   Social Determinants of Health   Financial Resource Strain: Low Risk  (09/03/2022)   Overall Financial Resource Strain (CARDIA)    Difficulty of Paying Living Expenses: Not very hard  Food Insecurity: No Food Insecurity (09/03/2022)   Hunger  Vital Sign    Worried About Running Out of Food in the Last Year: Never true    Ran Out of Food in the Last Year: Never true  Transportation Needs: No Transportation Needs (09/03/2022)   PRAPARE - Administrator, Civil Service (Medical): No    Lack of Transportation (Non-Medical): No  Physical Activity: Insufficiently Active (09/03/2022)   Exercise Vital Sign    Days of Exercise per Week: 3 days    Minutes of Exercise per Session: 30 min  Stress: No Stress Concern Present (09/03/2022)   [employer] of Occupational Health - Occupational Stress Questionnaire    Feeling of Stress : Not at all  Social Connections: Moderately Isolated (09/03/2022)   Social Connection and Isolation Panel [NHANES]    Frequency of Communication with Friends and Family: More than three times a week    Frequency of Social Gatherings with Friends and Family: Once a week    Attends Religious Services: More than 4 times per year    Active Member of [employer] or Organizations: No    Attends Banker Meetings: Never    Marital Status: Separated  Intimate Partner Violence: Not At Risk (09/03/2022)   Humiliation, Afraid, Rape, and Kick questionnaire    Fear of Current or Ex-Partner: No    Emotionally Abused: No    Physically Abused: No    Sexually Abused: No   Family History:  Family History  Problem Relation Age of Onset   Heart disease Mother        CABG   Kidney disease Sister    Diabetes Brother    Kidney disease Brother        KIDNEY TRANSPLANT   Hypertension Sister    Healthy Daughter    Healthy Daughter    Healthy Son     Review of Systems: Constitutional: Doesn't report fevers, chills or abnormal weight loss Eyes: Doesn't report blurriness of vision Ears, nose, mouth, throat, and face: Doesn't report sore throat Respiratory: Doesn't report cough, dyspnea or wheezes Cardiovascular: Doesn't report palpitation, chest discomfort  Gastrointestinal:  Doesn't report nausea,  constipation, diarrhea GU: Doesn't report incontinence Skin: Doesn't report skin rashes Neurological: Per HPI Musculoskeletal: diffuse joint pain Behavioral/Psych: Doesn't report anxiety  Physical Exam:  Wt Readings from Last 3 Encounters:  11/22/22 179 lb 3.2 oz (81.3 kg)  11/20/22 182 lb (82.6 kg)  10/15/22 179 lb 14.4 oz (81.6 kg)   Temp Readings from Last 3 Encounters:  11/22/22 97.8 F (36.6 C)  11/20/22 (!) 97.5 F (36.4 C) (Oral)  10/15/22 97.7 F (36.5 C) (Oral)   BP Readings from Last 3 Encounters:  11/22/22 (!) 148/69  11/20/22 136/63  10/15/22 125/72   Pulse Readings from Last 3 Encounters:  11/22/22 72  11/20/22 72  10/15/22 87    KPS: 80. General: Alert, cooperative, pleasant, in no acute distress Head: Normal EENT: No conjunctival injection or scleral icterus.  Lungs: Resp effort normal Cardiac: Regular rate Abdomen: Non-distended abdomen Skin: No rashes cyanosis or petechiae. Extremities: No clubbing or edema  Neurologic Exam: Mental Status: Awake, alert, attentive to examiner. Oriented to self and environment. Language is fluent with intact comprehension.  Cranial Nerves: Visual acuity is grossly normal. Visual fields are full. Extra-ocular movements intact. No ptosis. Face is symmetric Motor: Tone and bulk are normal. Power is full in both arms and legs. Reflexes are symmetric, no pathologic reflexes present.  Sensory: Intact to light touch Gait: Normal.   Labs: I have reviewed the data as listed    Component Value Date/Time   NA 141 08/21/2022 0944   NA 143 07/24/2022 1132   K 3.7 08/21/2022 0944   CL 107 08/21/2022 0944   CO2 26 08/21/2022 0944   GLUCOSE 166 (H) 08/21/2022 0944   BUN 10 08/21/2022 0944   BUN 8 07/24/2022 1132   CREATININE 0.75 08/21/2022 0944   CALCIUM 9.4 08/21/2022 0944   PROT 7.9 08/21/2022 0944   PROT 7.4 07/24/2022 1132   ALBUMIN 4.5 08/21/2022 0944   ALBUMIN 4.6 07/24/2022 1132   AST 24 08/21/2022 0944   ALT  21 08/21/2022 0944   ALKPHOS 73 08/21/2022 0944   BILITOT 1.1 08/21/2022 0944   GFRNONAA >60 08/21/2022 0944   GFRAA >60 02/15/2020 1000   Lab Results  Component Value Date   WBC 6.4 11/22/2022   NEUTROABS 4.1 11/22/2022   HGB 12.2 (L) 11/22/2022   HCT 36.2 (L) 11/22/2022   MCV 86.6 11/22/2022   PLT 310 11/22/2022    Imaging:  [facility] Clinician Interpretation: I have personally reviewed the CNS images as listed.  My interpretation, in the context of the patient's clinical presentation, is stable disease pending official read  CT CHEST ABDOMEN PELVIS W CONTRAST  Result Date: 11/21/2022 CLINICAL DATA:  Metastatic lung cancer restaging * Tracking Code: BO * EXAM: CT CHEST, ABDOMEN, AND PELVIS WITH CONTRAST TECHNIQUE: Multidetector CT imaging of the chest, abdomen and pelvis was performed following the standard protocol during bolus administration of intravenous contrast. RADIATION DOSE REDUCTION: This exam was performed according to the departmental dose-optimization program which includes automated exposure control, adjustment of the mA and/or kV according to patient size and/or use of iterative reconstruction technique. CONTRAST:  ISOVUE-300 IOPAMIDOL (ISOVUE-300) INJECTION 61% additional oral enteric contrast COMPARISON:  08/22/2022 FINDINGS: CT CHEST FINDINGS Cardiovascular: Right chest port catheter. Aortic atherosclerosis. Normal heart size. Left coronary artery calcifications. No pericardial effusion. Mediastinum/Nodes: No enlarged mediastinal, hilar, or axillary lymph nodes. Thyroid gland, trachea, and esophagus demonstrate no significant findings. Lungs/Pleura: Mild centrilobular and paraseptal emphysema. Background of fine centrilobular nodularity, most concentrated in the lung apices. Diffuse bilateral bronchial wall thickening. Mild scarring and fibrosis of the perihilar left lung (series 4, image 92). Unchanged scarring or atelectasis of the  right lung base (series 4, image 140).  Unchanged focus of irregular scarring of the posterior right upper lobe (series 4, image 76). No pleural effusion or pneumothorax. Musculoskeletal: No chest wall abnormality. No acute osseous findings. CT ABDOMEN PELVIS FINDINGS Hepatobiliary: No solid liver abnormality is seen. No gallstones, gallbladder wall thickening, or biliary dilatation. Pancreas: Unremarkable. No pancreatic ductal dilatation or surrounding inflammatory changes. Spleen: Normal in size without significant abnormality. Adrenals/Urinary Tract: Adrenal glands are unremarkable. Kidneys are normal, without renal calculi, solid lesion, or hydronephrosis. Bladder is unremarkable. Stomach/Bowel: Stomach is within normal limits. Appendix appears normal. No evidence of bowel wall thickening, distention, or inflammatory changes. Sigmoid diverticulosis. Large burden of stool throughout the colon. Vascular/Lymphatic: Aortic atherosclerosis. No enlarged abdominal or pelvic lymph nodes. Reproductive: Mild prostatomegaly. Other: No abdominal wall hernia or abnormality. No ascites. Musculoskeletal: No acute osseous findings. IMPRESSION: 1. No evidence of recurrent or metastatic disease in the chest, abdomen, or pelvis. 2. Unchanged post treatment appearance of the perihilar left lung. 3. Emphysema and diffuse bilateral bronchial wall thickening. Background of fine centrilobular nodularity, most concentrated in the lung apices, consistent with smoking-related respiratory bronchiolitis. 4. Coronary artery disease. Aortic Atherosclerosis (ICD10-I70.0) and Emphysema (ICD10-J43.9). Electronically Signed   By: Jearld Lesch M.D.   On: 11/21/2022 17:45     Assessment/Plan Malignant neoplasm metastatic to brain [facility]) [C79.31]  Jared Shea. is clinically and radiographically stable today. No new or progressive changes, official read on MRI is still pending.  May con't Jared Shea for seizure prevention.  He has transitioned to observation for NSCLC with Dr.  Arbutus Shea.  We appreciate the opportunity to participate in the care of Jared Shea..    We ask that Jared Shea. return to clinic in 6 months following next brain MRI, or sooner as needed.  All questions were answered. The patient knows to call the clinic with any problems, questions or concerns. No barriers to learning were detected.  The total time spent in the encounter was 30 minutes and more than 50% was on counseling and review of test results   Henreitta Leber, MD Medical Director of Neuro-Oncology [facility] 11/22/22 9:27 AM

## 2022-11-24 NOTE — Progress Notes (Signed)
[facility] OFFICE PROGRESS NOTE  Rana Snare, DO [address] [place] [address]  DIAGNOSIS: Stage IV (TX, N2, M1 C) non-small cell lung cancer, adenocarcinoma diagnosed in August 2021 and presented with solitary brain metastasis in addition to mediastinal lymphadenopathy.   PDL1 Expression 70%   Molecular Biomarkers:  Tumor Mutational Burden - 52 Muts/Mb Microsatellite status - MS-Stable Genomic Findings For a complete list of the genes assayed, please refer to the Appendix. NF1 E1694* MTAP loss exons 2-8 RICTOR amplification ATRX A419V BRAF K483E CDKN2A/B CDKN2A loss, CDKN2B loss DNMT3A E205* FGF10 amplification NTRK1 amplification - equivocal? 7 Disease relevant genes with no reportable alterations: ALK, EGFR, ERBB2, KRAS, MET, RET, ROS1  PRIOR THERAPY: 1) Status post right craniotomy with tumor resection followed by [facility] to solitary brain metastasis under the care of Dr. Mitzi Hansen and Dr. Murrell Redden. 2) Concurrent chemoradiation with weekly carboplatin for AUC of 2 and paclitaxel 45 mg/M2.  First dose 02/01/2020. Status post 5 cycles.  Last dose was given February 29, 2020. 3) First-line treatment with immunotherapy with Libtayo (Cempilimab) 350 mg IV every 3 weeks. Last dose on 04/10/22.  Status post 35 cycles.   CURRENT THERAPY: Observation   INTERVAL HISTORY: Jared Shea. 67 y.o. male returns  to the clinic today for a follow-up visit.  The patient recently completed 2 years of immunotherapy with Libtayo in November 2023 and tolerated well overall except for some joint pain (which has since improved). He has been on observation since that time and feeling fairly well.  Today, he denies any fever, chills, or night sweats.  His appetite is "still a little weak". He states he is down about 2 lbs since last being seen. However, it looks like he has gained about 8 lbs since his last appointment in April 2024. His occasional dyspnea is stable.  He sometimes may have  an intermittent cough. He uses an OTC medication which is effective for him.  Denies any chest pain or hemoptysis. Denies any nausea, vomiting, diarrhea, or constipation. Denies any rashes or skin changes.  Denies any headache or visual changes.  He follows closely with neuro-oncology  and had a repeat MRI last week which did not show any disease progression in the brain. He recently had a restaging CT scan. He is here today for evaluation to review his scan results.      MEDICAL HISTORY: Past Medical History:  Diagnosis Date   Brain mass    Bursitis of right hip    Chest pain 08/12/2018   Chronic cough    Dependence on nicotine from cigarettes    Diabetes mellitus without complication (HCC)    Dysuria 04/03/2021   Essential hypertension 07/28/2018   Frequent headaches 06/27/2020   nscl ca dx'd 12/2019   Polyarthralgia 06/15/2020   Seizures (HCC)    Viral illness 03/23/2019    ALLERGIES:  is allergic to jardiance [empagliflozin] and penicillins.  MEDICATIONS:  Current Outpatient Medications  Medication Sig Dispense Refill   albuterol (VENTOLIN HFA) 108 (90 Base) MCG/ACT inhaler Inhale 2 puffs into the lungs every 6 (six) hours as needed for wheezing or shortness of breath. 8 g 2   aspirin EC 81 MG tablet Take 1 tablet (81 mg total) by mouth daily. Swallow whole. 90 tablet 3   atorvastatin (LIPITOR) 20 MG tablet Take 1 tablet (20 mg total) by mouth daily. 90 tablet 3   benzonatate (TESSALON PERLES) 100 MG capsule Take 1 capsule (100 mg total) by mouth  3 (three) times daily as needed for cough. 30 capsule 0   Blood Glucose Monitoring Suppl (ACCU-CHEK GUIDE) w/Device KIT Use glucose meter to check your blood sugar. 1 kit 0   camphor-menthol (SARNA) lotion Apply 1 Application topically as needed for itching. 222 mL 0   glucose blood test strip Use a new test strip each time to check your blood sugar. 100 each 12   hydrOXYzine (ATARAX) 10 MG tablet TAKE 1 TABLET(10 MG) BY MOUTH THREE  TIMES DAILY AS NEEDED 30 tablet 0   isosorbide mononitrate (IMDUR) 30 MG 24 hr tablet Take 1 tablet (30 mg total) by mouth daily. 90 tablet 3   Lancets (ONETOUCH DELICA PLUS LANCET30G) MISC Use a new lancet each time to check your blood sugar. 100 each 11   levETIRAcetam (KEPPRA) 750 MG tablet Take 1 tablet (750 mg total) by mouth 2 (two) times daily. 180 tablet 4   losartan (COZAAR) 25 MG tablet TAKE 1 TABLET (25 MG TOTAL) DAILY. 90 tablet 3   melatonin 5 MG TABS Take 1 tablet (5 mg total) by mouth at bedtime as needed (for sleep). 30 tablet 0   metFORMIN (GLUCOPHAGE) 500 MG tablet Take 1 tablet (500 mg total) by mouth 2 (two) times daily with a meal. 120 tablet 2   metoprolol succinate (TOPROL XL) 25 MG 24 hr tablet Take 1 tablet (25 mg total) by mouth at bedtime. 90 tablet 3   No current facility-administered medications for this visit.    SURGICAL HISTORY:  Past Surgical History:  Procedure Laterality Date   APPLICATION OF CRANIAL NAVIGATION N/A 01/07/2020   Procedure: APPLICATION OF CRANIAL NAVIGATION;  Surgeon: Coletta Memos, MD;  Location: [facility] OR;  Service: Neurosurgery;  Laterality: N/A;   CRANIOTOMY Right 01/07/2020   Procedure: RIGHT CRANIOTOMY FOR TUMOR RESECTION;  Surgeon: Coletta Memos, MD;  Location: [facility] OR;  Service: Neurosurgery;  Laterality: Right;  rm 21   ENDOBRONCHIAL ULTRASOUND N/A 12/21/2019   Procedure: ENDOBRONCHIAL ULTRASOUND;  Surgeon: Tomma Lightning, MD;  Location: [facility] ENDOSCOPY;  Service: Pulmonary;  Laterality: N/A;   FINE NEEDLE ASPIRATION  12/21/2019   Procedure: FINE NEEDLE ASPIRATION (FNA) LINEAR;  Surgeon: Tomma Lightning, MD;  Location: [facility] ENDOSCOPY;  Service: Pulmonary;;   IR IMAGING GUIDED PORT INSERTION  03/03/2020   NO PAST SURGERIES     VIDEO BRONCHOSCOPY N/A 12/21/2019   Procedure: VIDEO BRONCHOSCOPY WITHOUT FLUORO;  Surgeon: Tomma Lightning, MD;  Location: [facility] ENDOSCOPY;  Service: Pulmonary;  Laterality: N/A;    REVIEW OF SYSTEMS:   Review of  Systems  Constitutional: Positive for diminished appetite. Negative for chills, fatigue, fever and unexpected weight change.  HENT: Negative for mouth sores, nosebleeds, sore throat and trouble swallowing.   Eyes: Negative for eye problems and icterus.  Respiratory: Positive for baseline dyspnea on exertion and occasional cough. Negative for hemoptysis and wheezing.   Cardiovascular: Negative for chest pain and leg swelling.  Gastrointestinal: Negative for abdominal pain, constipation, diarrhea, nausea and vomiting.  Genitourinary: Negative for bladder incontinence, difficulty urinating, dysuria, frequency and hematuria.   Musculoskeletal: Negative for back pain, gait problem, neck pain and neck stiffness.  Skin: Negative for itching and rash.  Neurological: Negative for dizziness, extremity weakness, gait problem, headaches, light-headedness and seizures.  Hematological: Negative for adenopathy. Does not bruise/bleed easily.  Psychiatric/Behavioral: Negative for confusion, depression and sleep disturbance. The patient is not nervous/anxious.     PHYSICAL EXAMINATION:  Blood pressure (!) 149/81, pulse 73, temperature 98.1 F (36.7  C), temperature source Oral, resp. rate 15, weight 178 lb 14.4 oz (81.1 kg), SpO2 100%.  ECOG PERFORMANCE STATUS: 1  Physical Exam  Constitutional: Oriented to person, place, and time and well-developed, well-nourished, and in no distress.  HENT:  Head: Normocephalic and atraumatic.  Mouth/Throat: Oropharynx is clear and moist. No oropharyngeal exudate.  Eyes: Conjunctivae are normal. Right eye exhibits no discharge. Left eye exhibits no discharge. No scleral icterus.  Neck: Normal range of motion. Neck supple.  Cardiovascular: Normal rate, regular rhythm, normal heart sounds and intact distal pulses.   Pulmonary/Chest: Effort normal and breath sounds normal. No respiratory distress. No wheezes. No rales.  Abdominal: Soft. Bowel sounds are normal. Exhibits no  distension and no mass. There is no tenderness.  Musculoskeletal: Normal range of motion. Exhibits no edema.  Lymphadenopathy:    No cervical adenopathy.  Neurological: Alert and oriented to person, place, and time. Exhibits normal muscle tone. Gait normal. Coordination normal.  Skin: Skin is warm and dry. No rash noted. Not diaphoretic. No erythema. No pallor.  Psychiatric: Mood, memory and judgment normal.  Vitals reviewed.  LABORATORY DATA: Lab Results  Component Value Date   WBC 6.1 11/28/2022   HGB 12.1 (L) 11/28/2022   HCT 35.9 (L) 11/28/2022   MCV 86.9 11/28/2022   PLT 307 11/28/2022      Chemistry      Component Value Date/Time   NA 143 11/28/2022 0809   NA 143 07/24/2022 1132   K 3.7 11/28/2022 0809   CL 110 11/28/2022 0809   CO2 26 11/28/2022 0809   BUN 14 11/28/2022 0809   BUN 8 07/24/2022 1132   CREATININE 0.75 11/28/2022 0809      Component Value Date/Time   CALCIUM 9.4 11/28/2022 0809   ALKPHOS 74 11/28/2022 0809   AST 28 11/28/2022 0809   ALT 31 11/28/2022 0809   BILITOT 0.6 11/28/2022 0809       RADIOGRAPHIC STUDIES:  MR BRAIN W WO CONTRAST  Result Date: 11/25/2022 CLINICAL DATA:  Brain/CNS neoplasm, assess treatment response. Previous metastatic disease. EXAM: MRI HEAD WITHOUT AND WITH CONTRAST TECHNIQUE: Multiplanar, multiecho pulse sequences of the brain and surrounding structures were obtained without and with intravenous contrast. CONTRAST:  8mL GADAVIST GADOBUTROL 1 MMOL/ML IV SOLN COMPARISON:  04/26/2022 and 10/13/2021. FINDINGS: Brain: Diffusion imaging is normal. No abnormality affects the brainstem or cerebellum. There is been previous right posterior frontal craniotomy with underlying brain resection/biopsy. Some focal gliosis and volume loss with some chronic hemosiderin deposition again noted in that location. No evidence of abnormal enhancement or increasing mass effect. No evidence of residual or recurrent tumor. Elsewhere, there are a few  old scattered small vessel infarctions which are grossly nonprogressive. No large vessel territory stroke. No evidence of new mass, hydrocephalus or extra-axial collection. Vascular: Major vessels at the base of the brain show flow. Skull and upper cervical spine: Otherwise negative Sinuses/Orbits: Clear/normal Other: None IMPRESSION: 1. No change since the most recent study of 04/26/2022. Previous right posterior frontal craniotomy with underlying brain resection/biopsy. No evidence of residual or recurrent tumor. 2. Old small vessel infarctions elsewhere, grossly nonprogressive. Electronically Signed   By: Paulina Fusi M.D.   On: 11/25/2022 17:03   CT CHEST ABDOMEN PELVIS W CONTRAST  Result Date: 11/21/2022 CLINICAL DATA:  Metastatic lung cancer restaging * Tracking Code: BO * EXAM: CT CHEST, ABDOMEN, AND PELVIS WITH CONTRAST TECHNIQUE: Multidetector CT imaging of the chest, abdomen and pelvis was performed following the standard protocol during  bolus administration of intravenous contrast. RADIATION DOSE REDUCTION: This exam was performed according to the departmental dose-optimization program which includes automated exposure control, adjustment of the mA and/or kV according to patient size and/or use of iterative reconstruction technique. CONTRAST:  ISOVUE-300 IOPAMIDOL (ISOVUE-300) INJECTION 61% additional oral enteric contrast COMPARISON:  08/22/2022 FINDINGS: CT CHEST FINDINGS Cardiovascular: Right chest port catheter. Aortic atherosclerosis. Normal heart size. Left coronary artery calcifications. No pericardial effusion. Mediastinum/Nodes: No enlarged mediastinal, hilar, or axillary lymph nodes. Thyroid gland, trachea, and esophagus demonstrate no significant findings. Lungs/Pleura: Mild centrilobular and paraseptal emphysema. Background of fine centrilobular nodularity, most concentrated in the lung apices. Diffuse bilateral bronchial wall thickening. Mild scarring and fibrosis of the perihilar  left lung (series 4, image 92). Unchanged scarring or atelectasis of the right lung base (series 4, image 140). Unchanged focus of irregular scarring of the posterior right upper lobe (series 4, image 76). No pleural effusion or pneumothorax. Musculoskeletal: No chest wall abnormality. No acute osseous findings. CT ABDOMEN PELVIS FINDINGS Hepatobiliary: No solid liver abnormality is seen. No gallstones, gallbladder wall thickening, or biliary dilatation. Pancreas: Unremarkable. No pancreatic ductal dilatation or surrounding inflammatory changes. Spleen: Normal in size without significant abnormality. Adrenals/Urinary Tract: Adrenal glands are unremarkable. Kidneys are normal, without renal calculi, solid lesion, or hydronephrosis. Bladder is unremarkable. Stomach/Bowel: Stomach is within normal limits. Appendix appears normal. No evidence of bowel wall thickening, distention, or inflammatory changes. Sigmoid diverticulosis. Large burden of stool throughout the colon. Vascular/Lymphatic: Aortic atherosclerosis. No enlarged abdominal or pelvic lymph nodes. Reproductive: Mild prostatomegaly. Other: No abdominal wall hernia or abnormality. No ascites. Musculoskeletal: No acute osseous findings. IMPRESSION: 1. No evidence of recurrent or metastatic disease in the chest, abdomen, or pelvis. 2. Unchanged post treatment appearance of the perihilar left lung. 3. Emphysema and diffuse bilateral bronchial wall thickening. Background of fine centrilobular nodularity, most concentrated in the lung apices, consistent with smoking-related respiratory bronchiolitis. 4. Coronary artery disease. Aortic Atherosclerosis (ICD10-I70.0) and Emphysema (ICD10-J43.9). Electronically Signed   By: Jearld Lesch M.D.   On: 11/21/2022 17:45     ASSESSMENT/PLAN:  This is a very pleasant 67 year old African-American male diagnosed with stage IV (Tx, N2, M1c) non-small cell lung cancer, adenocarcinoma.  He presented with a solitary brain  metastasis in addition to right hilar and mediastinal lymphadenopathy.  He was diagnosed in August 2021.     The patient is status post right craniotomy with resection of the solitary brain metastasis with SRS under the care of Dr. Mitzi Hansen and Dr. Franky Macho.    He completed weekly concurrent chemoradiation with carboplatin for an AUC of 2 and paclitaxel 45 mg per metered square.  He is status post 5 cycles.  He tolerated it well except for some dysphagia/odynophagia.   The patient then had evidence of new brain metastases.  He is status post SRS to the new lesions on 04/22/2020 under the care of Dr. Mitzi Hansen.   The patient recently completed immunotherapy with Libtayo IV every 3 weeks.  He is status post 35 cycles and tolerated it well except for arthralgias.  His last dose of treatment was on 04/10/2022.   The patient recently had a restaging CT scan performed.  Dr. Arbutus Ped personally independently reviewed the scan and discussed the results with the patient today.  The scan did not show any evidence of disease progression.   Dr. Arbutus Ped recommends the patient continue on observation with a restaging CT scan in 4 months.  We will see him back for follow-up  visit at that time to review his scan results.   I will arrange for his port-a-cath to be flushed in 6 weeks.    He will continue to follow with Dr. Barbaraann Cao regarding his history of metastatic disease to the brain. His next brain MRI is due in January 2024.   The patient was advised to call immediately if he has any concerning symptoms in the interval. The patient voices understanding of current disease status and treatment options and is in agreement with the current care plan. All questions were answered. The patient knows to call the clinic with any problems, questions or concerns. We can certainly see the patient much sooner if necessary     Orders Placed This Encounter  Procedures   CT CHEST ABDOMEN PELVIS W CONTRAST    Standing Status:    Future    Standing Expiration Date:   11/28/2023    Order Specific Question:   If indicated for the ordered procedure, I authorize the administration of contrast media per Radiology protocol    Answer:   Yes    Order Specific Question:   Does the patient have a contrast media/X-ray dye allergy?    Answer:   No    Order Specific Question:   Preferred imaging location?    Answer:   [facility]    Order Specific Question:   If indicated for the ordered procedure, I authorize the administration of oral contrast media per Radiology protocol    Answer:   Yes   CBC with Differential (Cancer Center Only)    Standing Status:   Future    Standing Expiration Date:   11/28/2023   CMP (Cancer Center only)    Standing Status:   Future    Standing Expiration Date:   11/28/2023      Jared Abraham Taj Nevins, PA-C 11/28/22  ADDENDUM: Hematology/Oncology Attending: I had a face-to-face encounter with the patient today.  I reviewed his record, lab, scan and recommended his care plan.  This is a very pleasant 67 years old African-American male with stage IV non-small cell lung cancer, adenocarcinoma diagnosed in August 2021 with solitary brain metastasis in addition to mediastinal lymphadenopathy.  He is status post right craniotomy with tumor resection followed by SRS to the brain lesion as well as a course of concurrent chemoradiation with weekly carboplatin and paclitaxel. The patient has no actionable mutations and PD-L1 expression was 70%. He was then treated with first-line immunotherapy with Libtayo (Cempilimab) for 2 years discontinued on April 10, 2022 and he has been on observation since that time. The patient is feeling fine with no concerning complaints. He had repeat CT scan of the chest, abdomen pelvis as well as MRI of the brain performed recently.  I personally and independently reviewed the scan and discussed the result with the patient today.  His imaging studies showed no  concerning findings for disease progression. I recommended for him to continue on observation with repeat CT scan of the chest, abdomen and pelvis and 4 months. The patient will continue to have Port-A-Cath flush every 6 weeks. He was advised to call immediately if he has any other concerning symptoms in the interval. The total time spent in the appointment was 20 minutes. Disclaimer: This note was dictated with voice recognition software. Similar sounding words can inadvertently be transcribed and may be missed upon review. Lajuana Matte, MD

## 2022-11-26 ENCOUNTER — Inpatient Hospital Stay: Payer: Medicare HMO

## 2022-11-27 ENCOUNTER — Other Ambulatory Visit: Payer: Self-pay | Admitting: *Deleted

## 2022-11-27 DIAGNOSIS — C7931 Secondary malignant neoplasm of brain: Secondary | ICD-10-CM

## 2022-11-27 NOTE — Progress Notes (Signed)
 Internal Medicine Clinic Attending  Case discussed with the resident at the time of the visit.  We reviewed the resident's history and exam and pertinent patient test results.  I agree with the assessment, diagnosis, and plan of care documented in the resident's note.  

## 2022-11-28 ENCOUNTER — Other Ambulatory Visit: Payer: Medicare HMO

## 2022-11-28 ENCOUNTER — Other Ambulatory Visit: Payer: Self-pay

## 2022-11-28 ENCOUNTER — Ambulatory Visit: Payer: Medicare HMO | Admitting: Internal Medicine

## 2022-11-28 ENCOUNTER — Inpatient Hospital Stay: Payer: Medicare HMO

## 2022-11-28 ENCOUNTER — Inpatient Hospital Stay: Payer: Medicare HMO | Admitting: Physician Assistant

## 2022-11-28 VITALS — BP 149/81 | HR 73 | Temp 98.1°F | Resp 15 | Wt 178.9 lb

## 2022-11-28 DIAGNOSIS — C7931 Secondary malignant neoplasm of brain: Secondary | ICD-10-CM

## 2022-11-28 DIAGNOSIS — Z87891 Personal history of nicotine dependence: Secondary | ICD-10-CM | POA: Diagnosis not present

## 2022-11-28 DIAGNOSIS — C3490 Malignant neoplasm of unspecified part of unspecified bronchus or lung: Secondary | ICD-10-CM | POA: Diagnosis not present

## 2022-11-28 DIAGNOSIS — Z9221 Personal history of antineoplastic chemotherapy: Secondary | ICD-10-CM | POA: Diagnosis not present

## 2022-11-28 DIAGNOSIS — Z95828 Presence of other vascular implants and grafts: Secondary | ICD-10-CM

## 2022-11-28 DIAGNOSIS — Z923 Personal history of irradiation: Secondary | ICD-10-CM | POA: Diagnosis not present

## 2022-11-28 DIAGNOSIS — C3491 Malignant neoplasm of unspecified part of right bronchus or lung: Secondary | ICD-10-CM | POA: Diagnosis not present

## 2022-11-28 LAB — CBC WITH DIFFERENTIAL (CANCER CENTER ONLY)
Abs Immature Granulocytes: 0.02 10*3/uL (ref 0.00–0.07)
Basophils Absolute: 0 10*3/uL (ref 0.0–0.1)
Basophils Relative: 0 %
Eosinophils Absolute: 0.1 10*3/uL (ref 0.0–0.5)
Eosinophils Relative: 2 %
HCT: 35.9 % — ABNORMAL LOW (ref 39.0–52.0)
Hemoglobin: 12.1 g/dL — ABNORMAL LOW (ref 13.0–17.0)
Immature Granulocytes: 0 %
Lymphocytes Relative: 28 %
Lymphs Abs: 1.7 10*3/uL (ref 0.7–4.0)
MCH: 29.3 pg (ref 26.0–34.0)
MCHC: 33.7 g/dL (ref 30.0–36.0)
MCV: 86.9 fL (ref 80.0–100.0)
Monocytes Absolute: 0.5 10*3/uL (ref 0.1–1.0)
Monocytes Relative: 8 %
Neutro Abs: 3.8 10*3/uL (ref 1.7–7.7)
Neutrophils Relative %: 62 %
Platelet Count: 307 10*3/uL (ref 150–400)
RBC: 4.13 MIL/uL — ABNORMAL LOW (ref 4.22–5.81)
RDW: 18 % — ABNORMAL HIGH (ref 11.5–15.5)
WBC Count: 6.1 10*3/uL (ref 4.0–10.5)
nRBC: 0 % (ref 0.0–0.2)

## 2022-11-28 LAB — CMP (CANCER CENTER ONLY)
ALT: 31 U/L (ref 0–44)
AST: 28 U/L (ref 15–41)
Albumin: 4.3 g/dL (ref 3.5–5.0)
Alkaline Phosphatase: 74 U/L (ref 38–126)
Anion gap: 7 (ref 5–15)
BUN: 14 mg/dL (ref 8–23)
CO2: 26 mmol/L (ref 22–32)
Calcium: 9.4 mg/dL (ref 8.9–10.3)
Chloride: 110 mmol/L (ref 98–111)
Creatinine: 0.75 mg/dL (ref 0.61–1.24)
GFR, Estimated: >60 mL/min (ref >60)
Glucose, Bld: 164 mg/dL — ABNORMAL HIGH (ref 70–99)
Potassium: 3.7 mmol/L (ref 3.5–5.1)
Sodium: 143 mmol/L (ref 135–145)
Total Bilirubin: 0.6 mg/dL (ref 0.3–1.2)
Total Protein: 7.4 g/dL (ref 6.5–8.1)

## 2022-11-28 MED ORDER — SODIUM CHLORIDE 0.9% FLUSH
10.0000 mL | Freq: Once | INTRAVENOUS | Status: AC
  Administered 2022-11-28: 10 mL

## 2022-11-28 MED ORDER — HEPARIN SOD (PORK) LOCK FLUSH 100 UNIT/ML IV SOLN
500.0000 [IU] | Freq: Once | INTRAVENOUS | Status: AC
  Administered 2022-11-28: 500 [IU]

## 2022-12-03 ENCOUNTER — Inpatient Hospital Stay: Payer: Medicare HMO

## 2022-12-24 ENCOUNTER — Telehealth: Payer: Self-pay | Admitting: *Deleted

## 2022-12-24 ENCOUNTER — Other Ambulatory Visit: Payer: Self-pay | Admitting: Student

## 2022-12-24 ENCOUNTER — Encounter: Payer: Self-pay | Admitting: Internal Medicine

## 2022-12-24 DIAGNOSIS — M62838 Other muscle spasm: Secondary | ICD-10-CM

## 2022-12-24 MED ORDER — METHOCARBAMOL 500 MG PO TABS
ORAL_TABLET | ORAL | 0 refills | Status: AC
Start: 2022-12-24 — End: 2023-01-08

## 2022-12-24 NOTE — Telephone Encounter (Signed)
Received a call from pt who stated he's having cramps in his arms/hands; mainly left. Started about 2 weeks ago. He tried taking vinegar. Denies sob/ chest pain/dizziness. No available appts this week; first available is on the 21st. Pt wants to know if the doctor can call in a medication. Last labs were 11/28/22. Thanks

## 2022-12-24 NOTE — Progress Notes (Signed)
Called patient as he was having bilateral hand cramping that started about two weeks ago. He tried vinegar and it would help. He recently saw Oncologist and had MRI or brain and chest. He has Stage IV metastatic lung cancer. Concern for potential metastatic disease or electrolyte derangement. Last labs from 7/17 WNL. Denies perioral tingling. Will send prescription for Robaxin for muscle spasms. Will need Lab visit for repeat BMP and Mg levels. Will need earliest appointment available within 2 weeks in clinic for evaluation. Called patient to discuss and he did not pick up x3 LVM. If symptoms worsen or he starts dropping things he will need to head to ED for further evaluation.   Manuela Neptune, MD PGY-1

## 2022-12-24 NOTE — Addendum Note (Signed)
Addended by: Derrek Monaco on: 12/24/2022 04:22 PM   Modules accepted: Orders

## 2022-12-24 NOTE — Telephone Encounter (Signed)
Pt has a lab appt tomorrow am @ 1100AM and he will make the  OV appt tomorrow when he comes in. Also he wanted to know cost of Robaxin - I called [facility] who stated no cost' pt was informed.

## 2022-12-25 ENCOUNTER — Other Ambulatory Visit (INDEPENDENT_AMBULATORY_CARE_PROVIDER_SITE_OTHER): Payer: Medicare HMO

## 2022-12-25 DIAGNOSIS — M62838 Other muscle spasm: Secondary | ICD-10-CM | POA: Diagnosis not present

## 2022-12-25 DIAGNOSIS — R252 Cramp and spasm: Secondary | ICD-10-CM | POA: Diagnosis not present

## 2022-12-26 NOTE — Progress Notes (Signed)
No electrolyte abnomalities noted on CMP. Mg WNL. Pt has known stage IV metastatic lung cancer and his symptoms are concerning for potential metastasis. He was prescribed Robaxin and has appointment to follow up with [place] 8/27. Notified patient of results.

## 2023-01-08 ENCOUNTER — Ambulatory Visit: Payer: Medicare HMO | Admitting: Student

## 2023-01-08 ENCOUNTER — Other Ambulatory Visit: Payer: Self-pay | Admitting: Student

## 2023-01-08 ENCOUNTER — Other Ambulatory Visit: Payer: Self-pay | Admitting: Internal Medicine

## 2023-01-08 ENCOUNTER — Encounter: Payer: Self-pay | Admitting: Student

## 2023-01-08 ENCOUNTER — Other Ambulatory Visit: Payer: Self-pay

## 2023-01-08 VITALS — BP 138/67 | HR 74 | Temp 98.1°F | Ht 73.0 in | Wt 185.4 lb

## 2023-01-08 DIAGNOSIS — Z87891 Personal history of nicotine dependence: Secondary | ICD-10-CM

## 2023-01-08 DIAGNOSIS — E119 Type 2 diabetes mellitus without complications: Secondary | ICD-10-CM

## 2023-01-08 DIAGNOSIS — G40209 Localization-related (focal) (partial) symptomatic epilepsy and epileptic syndromes with complex partial seizures, not intractable, without status epilepticus: Secondary | ICD-10-CM

## 2023-01-08 DIAGNOSIS — I429 Cardiomyopathy, unspecified: Secondary | ICD-10-CM | POA: Diagnosis not present

## 2023-01-08 DIAGNOSIS — Z7984 Long term (current) use of oral hypoglycemic drugs: Secondary | ICD-10-CM

## 2023-01-08 DIAGNOSIS — R252 Cramp and spasm: Secondary | ICD-10-CM | POA: Diagnosis not present

## 2023-01-08 DIAGNOSIS — I10 Essential (primary) hypertension: Secondary | ICD-10-CM

## 2023-01-08 DIAGNOSIS — C3490 Malignant neoplasm of unspecified part of unspecified bronchus or lung: Secondary | ICD-10-CM

## 2023-01-08 DIAGNOSIS — M62838 Other muscle spasm: Secondary | ICD-10-CM

## 2023-01-08 MED ORDER — ISOSORBIDE MONONITRATE ER 30 MG PO TB24: 30.0000 mg | ORAL_TABLET | Freq: Every day | ORAL | 3 refills | Status: DC

## 2023-01-08 MED ORDER — LEVETIRACETAM 750 MG PO TABS
750.0000 mg | ORAL_TABLET | Freq: Two times a day (BID) | ORAL | 4 refills | Status: DC
Start: 2023-01-08 — End: 2023-05-06

## 2023-01-08 MED ORDER — ASPIRIN 81 MG PO TBEC: 81.0000 mg | DELAYED_RELEASE_TABLET | Freq: Every day | ORAL | Status: AC

## 2023-01-08 MED ORDER — ATORVASTATIN CALCIUM 20 MG PO TABS: 20.0000 mg | ORAL_TABLET | Freq: Every day | ORAL | 3 refills | Status: DC

## 2023-01-08 MED ORDER — METOPROLOL SUCCINATE ER 25 MG PO TB24: 25.0000 mg | ORAL_TABLET | Freq: Every evening | ORAL | 3 refills | Status: DC

## 2023-01-08 MED ORDER — LOSARTAN POTASSIUM 25 MG PO TABS
25.0000 mg | ORAL_TABLET | Freq: Every day | ORAL | 3 refills | Status: DC
Start: 2023-01-08 — End: 2023-02-19

## 2023-01-08 MED ORDER — METFORMIN HCL 500 MG PO TABS
500.0000 mg | ORAL_TABLET | Freq: Two times a day (BID) | ORAL | 2 refills | Status: DC
Start: 2023-01-08 — End: 2023-08-12

## 2023-01-08 NOTE — Assessment & Plan Note (Signed)
>>  ASSESSMENT AND PLAN FOR CARDIOMYOPATHY, UNSPECIFIED (HCC) WRITTEN ON 01/09/2023  1:53 PM BY PATEL, AMAR, DO  Patient does have history of cardiomyopathy.  He is followed by cardiology.  His current regimen includes Imdur , losartan , Toprol -XL.  He was to take Jardiance , but did not tolerate it well.  He stated that it made him itchy.  Patient was taken off of it. Patient had repeat echocardiogram on 10/19/2022 which showed improved heart function.  EF is now 50 to 55% which has improved from 45 to 50%.  Will continue with current management.  Plan: -Continue losartan  25 mg daily -Continue metoprolol  succinate 25 mg daily Continue Imdur  30 mg daily -Continue aspirin  81 mg daily

## 2023-01-08 NOTE — Patient Instructions (Addendum)
Jared Sela Jr.,Thank you for allowing me to take part in your care today.  Here are your instructions.  1.  For your high blood pressure please continue taking losartan 25 daily, metoprolol succinate 25 mg daily, Imdur 30 mg daily. I have sent refills to you pharmacy. Please call the to make sure that you get them.  2. Regarding you  high blood pressure I will give you a log.  Please fill out this log and come back in 1 month.  3.  Continue taking your Keppra.  4.  Regarding your hand cramps, continue taking Robaxin as needed.  5.  Follow-up in 1 month for blood pressure recheck.  6.  Please make sure that you have a CT scan of your chest, abdomen, pelvis in November.  Follow-up with your oncologist.  7.  You can get your COVID-vaccine at the pharmacy.  Thank you, Dr. Allena Katz  If you have any other questions please contact the internal medicine clinic at [phone]

## 2023-01-08 NOTE — Assessment & Plan Note (Signed)
Patient presents to the clinic with elevated blood pressure.  Initially 147/67. Next pressure was 122/74.  It did end up decreasing to 138/67.  Follow-up patient does endorse that he does not know which medications he is taking.  He states this morning he took his metoprolol.  Unsure if taking losartan or Imdur.  Per dispense report, patient does not seem to have any of his medications.  Did educate the patient on the importance of taking his medications.  Will refill his medications.  Told to call his pharmacy to make sure he has medications.  Plan: -Blood pressure log provided for patient -Refill metoprolol succinate 25 mg daily -Refilled losartan 25 mg daily -Refill Imdur 30 mg daily -Follow-up in 1 month

## 2023-01-08 NOTE — Assessment & Plan Note (Signed)
Patient last recent A1c was in April 2024.  It was 5.7.  Patient is returning in about 1 month, can recheck A1c at that time.  He reports compliance with metformin.  Plan: -Continue metformin 500 mg -A1c in 1 month

## 2023-01-08 NOTE — Progress Notes (Signed)
CC: Bilateral hand cramping  HPI:  [name] is a 67 y.o. male with past medical history of hypertension, non-small cell carcinoma of the lung stage IV status post right craniotomy and tumor resection 2021 who presents with bilateral hand cramping. Please see assessment and plan for full HPI.  Medications: Hypertension: Losartan 25 mg daily, metoprolol succinate 25 mg nightly, Imdur 30 mg daily Cardiomyopathy: Metoprolol succinate 25 mg nightly, losartan, Imdur Diabetes: Metformin 500 mg twice daily Aortic atherosclerosis: Aspirin 81 mg daily, atorvastatin 20 mg daily History of seizures: Keppra 750 mg twice daily Hand Cramps: Robaxin 500 mg TID PRN   Past Medical History:  Diagnosis Date   Brain mass    Bursitis of right hip    Chest pain 08/12/2018   Chronic cough    Dependence on nicotine from cigarettes    Diabetes mellitus without complication (HCC)    Dysuria 04/03/2021   Essential hypertension 07/28/2018   Frequent headaches 06/27/2020   nscl ca dx'd 12/2019   Polyarthralgia 06/15/2020   Seizures (HCC)    Viral illness 03/23/2019     Current Outpatient Medications:    aspirin EC 81 MG tablet, Take 1 tablet (81 mg total) by mouth daily. Swallow whole., Disp: , Rfl:    atorvastatin (LIPITOR) 20 MG tablet, Take 1 tablet (20 mg total) by mouth daily., Disp: 90 tablet, Rfl: 3   Blood Glucose Monitoring Suppl (ACCU-CHEK GUIDE) w/Device KIT, Use glucose meter to check your blood sugar., Disp: 1 kit, Rfl: 0   glucose blood test strip, Use a new test strip each time to check your blood sugar., Disp: 100 each, Rfl: 12   hydrOXYzine (ATARAX) 10 MG tablet, TAKE 1 TABLET(10 MG) BY MOUTH THREE TIMES DAILY AS NEEDED, Disp: 30 tablet, Rfl: 0   isosorbide mononitrate (IMDUR) 30 MG 24 hr tablet, Take 1 tablet (30 mg total) by mouth daily., Disp: 90 tablet, Rfl: 3   Lancets (ONETOUCH DELICA PLUS LANCET30G) MISC, Use a new lancet each time to check your blood sugar., Disp: 100  each, Rfl: 11   levETIRAcetam (KEPPRA) 750 MG tablet, Take 1 tablet (750 mg total) by mouth 2 (two) times daily., Disp: 180 tablet, Rfl: 4   losartan (COZAAR) 25 MG tablet, Take 1 tablet (25 mg total) by mouth daily., Disp: 90 tablet, Rfl: 3   melatonin 5 MG TABS, Take 1 tablet (5 mg total) by mouth at bedtime as needed (for sleep)., Disp: 30 tablet, Rfl: 0   metFORMIN (GLUCOPHAGE) 500 MG tablet, Take 1 tablet (500 mg total) by mouth 2 (two) times daily with a meal., Disp: 120 tablet, Rfl: 2   metoprolol succinate (TOPROL XL) 25 MG 24 hr tablet, Take 1 tablet (25 mg total) by mouth at bedtime., Disp: 90 tablet, Rfl: 3  Review of Systems:    Patient endorses no concerns   Physical Exam:  Vitals:   01/08/23 0906 01/08/23 0917 01/08/23 0945  BP: (!) 147/67 (!) 152/74 138/67  Pulse: 80 77 74  Temp: 98.1 F (36.7 C)    TempSrc: Oral    SpO2: 100%    Weight: 185 lb 6.4 oz (84.1 kg)    Height: 6\' 1"  (1.854 m)     General: Patient is sitting comfortably in the room  Head: Normocephalic, atraumatic  Cardio: Regular rate and rhythm, no murmurs, rubs or gallops.  Pulmonary: Clear to ausculation bilaterally with no rales, rhonchi, and crackles  Extremities: 5/5 strength noted to bilateral upper extremities on grip  strength    Assessment & Plan:   Essential hypertension Patient presents to the clinic with elevated blood pressure.  Initially 147/67. Next pressure was 122/74.  It did end up decreasing to 138/67.  Follow-up patient does endorse that he does not know which medications he is taking.  He states this morning he took his metoprolol.  Unsure if taking losartan or Imdur.  Per dispense report, patient does not seem to have any of his medications.  Did educate the patient on the importance of taking his medications.  Will refill his medications.  Told to call his pharmacy to make sure he has medications.  Plan: -Blood pressure log provided for patient -Refill metoprolol succinate 25 mg  daily -Refilled losartan 25 mg daily -Refill Imdur 30 mg daily -Follow-up in 1 month  Cardiomyopathy, unspecified (HCC) Patient does have history of cardiomyopathy.  He is followed by cardiology.  His current regimen includes Imdur, losartan, Toprol-XL.  He was to take Jardiance, but did not tolerate it well.  He stated that it made him itchy.  Patient was taken off of it. Patient had repeat echocardiogram on 10/19/2022 which showed improved heart function.  EF is now 50 to 55% which has improved from 45 to 50%.  Will continue with current management.  Plan: -Continue losartan 25 mg daily -Continue metoprolol succinate 25 mg daily Continue Imdur 30 mg daily -Continue aspirin 81 mg daily  Non-small cell carcinoma of lung, stage 4 w/ mets to brain [facility]) Patient with stage 4 NSCC of lung. He had a solitary brain metastasis that was treated with R craniotomy. He is s/p chemoradiation and is following oncology regularly. Most recent imaging appears stable/slightly improved. He is currently on observation with periodic imaging and follow up.  Patient had repeat MRI 1 month ago, which did not show any changes or any recurrent tumors.  Patient is planning to get CT chest abdomen pelvis in November 2024.  Plan: -Plan per oncology -Observe  Diabetes mellitus [facility]) Patient last recent A1c was in April 2024.  It was 5.7.  Patient is returning in about 1 month, can recheck A1c at that time.  He reports compliance with metformin.  Plan: -Continue metformin 500 mg -A1c in 1 month  Cramping of hands Patient 2 weeks ago complained of hand cramping.  Prescribed Robaxin.  Resolved.  Plan: -Patient has Robaxin leftover, can use as needed   Patient discussed with Dr. Princella Pellegrini, DO PGY-2 Internal Medicine Resident  Pager: [phone]

## 2023-01-08 NOTE — Assessment & Plan Note (Signed)
Patient does have history of cardiomyopathy.  He is followed by cardiology.  His current regimen includes Imdur, losartan, Toprol-XL.  He was to take Jardiance, but did not tolerate it well.  He stated that it made him manage.  Patient was taken off of it. Patient had repeat echocardiogram on 10/19/2022 which showed improved heart function.  EF is now 50 to 55% which has improved from 45 to 50%.  Will continue with current management.  Plan: -Continue losartan 25 mg daily -Continue metoprolol succinate 25 mg daily Continue Imdur 30 mg daily -Continue aspirin 81 mg daily

## 2023-01-08 NOTE — Assessment & Plan Note (Signed)
Patient with stage 4 NSCC of lung. He had a solitary brain metastasis that was treated with R craniotomy. He is s/p chemoradiation and is following oncology regularly. Most recent imaging appears stable/slightly improved. He is currently on observation with periodic imaging and follow up.  Patient had repeat MRI 1 month ago, which did not show any changes or any recurrent tumors.  Patient is planning to get CT chest abdomen pelvis in November 2024.  Plan: -Plan per oncology -Observe

## 2023-01-08 NOTE — Assessment & Plan Note (Signed)
>>  ASSESSMENT AND PLAN FOR UNCONTROLLED TYPE 2 DIABETES MELLITUS WITH HYPERGLYCEMIA, WITHOUT LONG-TERM CURRENT USE OF INSULIN  (HCC) WRITTEN ON 01/08/2023  2:01 PM BY PATEL, AMAR, DO  Patient last recent A1c was in April 2024.  It was 5.7.  Patient is returning in about 1 month, can recheck A1c at that time.  He reports compliance with metformin .  Plan: -Continue metformin  500 mg -A1c in 1 month

## 2023-01-08 NOTE — Assessment & Plan Note (Signed)
Patient 2 weeks ago complained of hand cramping.  Prescribed Robaxin.  Resolved.  Plan: -Patient has Robaxin leftover, can use as needed

## 2023-01-09 ENCOUNTER — Inpatient Hospital Stay: Payer: Medicare HMO | Attending: Internal Medicine

## 2023-01-09 DIAGNOSIS — Z95828 Presence of other vascular implants and grafts: Secondary | ICD-10-CM

## 2023-01-09 DIAGNOSIS — Z85118 Personal history of other malignant neoplasm of bronchus and lung: Secondary | ICD-10-CM | POA: Insufficient documentation

## 2023-01-09 DIAGNOSIS — Z452 Encounter for adjustment and management of vascular access device: Secondary | ICD-10-CM | POA: Insufficient documentation

## 2023-01-09 MED ORDER — SODIUM CHLORIDE 0.9% FLUSH
10.0000 mL | Freq: Once | INTRAVENOUS | Status: AC
  Administered 2023-01-09: 10 mL

## 2023-01-09 MED ORDER — HEPARIN SOD (PORK) LOCK FLUSH 100 UNIT/ML IV SOLN
500.0000 [IU] | Freq: Once | INTRAVENOUS | Status: AC
  Administered 2023-01-09: 500 [IU]

## 2023-01-09 NOTE — Progress Notes (Signed)
Internal Medicine Clinic Attending  Case discussed with the resident at the time of the visit.  We reviewed the resident's history and exam and pertinent patient test results.  I agree with the assessment, diagnosis, and plan of care documented in the resident's note.  

## 2023-01-15 ENCOUNTER — Other Ambulatory Visit: Payer: Medicare HMO

## 2023-01-24 NOTE — Progress Notes (Signed)
Cardiology Office Note:   Date:  02/01/2023  ID:  Jared Beady., DOB 21-Mar-1956, MRN [id number] PCP:  Rana Snare, DO  [facility] Providers Cardiologist:  Alverda Skeans, MD Referring MD: Rana Snare, DO   Chief Complaint/Reason for Referral: Cardiology follow-up ASSESSMENT:    1. Cardiomyopathy, unspecified type (HCC)   2. Type 2 diabetes mellitus without complication, without long-term current use of insulin (HCC)   3. Hypertension associated with diabetes (HCC)   4. Hyperlipidemia associated with type 2 diabetes mellitus (HCC)   5. Aortic atherosclerosis (HCC)   6. Coronary artery disease of native artery of native heart with stable angina pectoris (HCC)   7. PVC (premature ventricular contraction)   8. Tobacco abuse   9. BMI 25.0-25.9,adult     PLAN:   In order of problems listed above: 1.  Mild cardiomyopathy: Continue losartan and Toprol. 2.  Type 2 diabetes: Continue aspirin 81 mg daily, losartan 25 mg, atorvastatin 20 mg daily; intolerant to Jardiance due to pruritus.  Consider GLP1RA see below. 3.  Hypertension: Blood pressure is well-controlled today. 4.  Hyperlipidemia: Check lipid panel and LFTs today. 5.  Aortic atherosclerosis: Continue aspirin and atorvastatin, and pursue strict blood pressure control. 6.  Coronary artery disease: Has fortunately significant mid RCA and OM disease.  Given these noncritical locations we will continue medical therapy with Toprol and Imdur.  The patient is clinically asymptomatic. 7.  PVCs: Improved with beta-blockade. 8.  Tobacco abuse: Carotid ultrasounds in 2022 were reassuring.  CT chest abdomen pelvis in July demonstrated no abdominal aortic aneurysm. 9.  Elevated BMI: Will refer to pharmacy for recommendations regarding GLP-1 receptor agonist to decrease risk of future myocardial infarction.           Dispo:  Return in about 6 months (around 08/01/2023).      Medication Adjustments/Labs and Tests Ordered: Current  medicines are reviewed at length with the patient today.  Concerns regarding medicines are outlined above.  The following changes have been made:  no change   Labs/tests ordered: Orders Placed This Encounter  Procedures   Lipid panel   Hepatic function panel   AMB Referral to [facility]    Medication Changes: No orders of the defined types were placed in this encounter.   Current medicines are reviewed at length with the patient today.  The patient does not have concerns regarding medicines.  History of Present Illness:    FOCUSED PROBLEM LIST:   1.  Type 2 diabetes on metformin; intolerant of Jardiance due to pruritus 2.  Hypertension 3.  Aortic atherosclerosis on chest CT 2024 4.  Non-small cell lung cancer stage IV metastatic to the brain and mediastinal lymph nodes; followed by oncology; patient was treated with carboplatin, Taxol, palonosetron and XRT in 2021; s/p cemiplimab immunotherapy 5.  Mild cardiomyopathy with ejection fraction of 40 to 45% on TTE February 2024 6.  Coronary artery disease with CT FFR positive OM 2 and mid RCA lesions on CTA March 2024 7.  Hyperlipidemia; low LP(a)   March 2024: The patient is a 67 y.o. male with the indicated medical history here for recommendations regarding PVCs and incidentally noted mild cardiomyopathy.  The patient tells me that his cancer is relatively stable and he is done with immunotherapy.  He tells me he gets short of breath at times but is this does not seem to be a routine occurrence.  He is not short of breath at rest.  He denies any orthopnea, paroxysmal  of Systems:   Please see the history of present illness.    All other systems reviewed and are negative.     EKGs/Labs/Other Test Reviewed:   EKG: EKG performed January 2024 that I personally reviewed demonstrates sinus rhythm with frequent PVCs  EKG Interpretation Date/Time:    Ventricular Rate:    PR Interval:    QRS Duration:    QT Interval:    QTC Calculation:   R Axis:      Text Interpretation:          Prior CV studies reviewed: Cardiac Studies & Procedures     STRESS TESTS  MYOCARDIAL PERFUSION IMAGING 10/20/2018  Narrative  Nuclear stress EF: 54%.  The study is normal.  Low risk stress nuclear study with normal perfusion and normal left ventricular regional and global systolic function.   ECHOCARDIOGRAM  ECHOCARDIOGRAM COMPLETE 10/19/2022  Narrative ECHOCARDIOGRAM REPORT    Patient Name:   Jared Shea. Date of Exam: 10/19/2022 Medical Rec #:  [id number]       Height:       73.0 in Accession #:    [id number]      Weight:       179.9 lb Date of Birth:  21-Jul-1955       BSA:          2.057 m Patient Age:    66 years        BP:           125/72 mmHg Patient Gender: M               HR:           74  bpm. Exam Location:  [address]  Procedure: 2D Echo, Cardiac Doppler, Color Doppler and Strain Analysis  Indications:    R07.9 Chest pain  History:        Patient has prior history of Echocardiogram examinations, most recent 06/27/2022. Cardiomyopathy, CAD, Arrythmias:PVC, Signs/Symptoms:Chest Pain; Risk Factors:Hypertension, Diabetes, Dyslipidemia and Former Smoker.  Sonographer:    Samule Ohm RDCS Referring Phys: [id number] Orbie Pyo  IMPRESSIONS   1. Left ventricular ejection fraction, by estimation, is 50 to 55%. Left ventricular ejection fraction by PLAX is 55 %. The left ventricle has low normal function. The left ventricle has no regional wall motion abnormalities. Left ventricular diastolic parameters are consistent with Grade I diastolic dysfunction (impaired relaxation). 2. Right ventricular systolic function is normal. The right ventricular size is normal. Tricuspid regurgitation signal is inadequate for assessing PA pressure. 3. Left atrial size was moderately dilated. 4. The mitral valve is abnormal. Trivial mitral valve regurgitation. 5. The aortic valve is tricuspid. Aortic valve regurgitation is trivial. Aortic valve sclerosis/calcification is present, without any evidence of aortic stenosis. Aortic regurgitation PHT measures 514 msec. 6. The inferior vena cava is normal in size with greater than 50% respiratory variability, suggesting right atrial pressure of 3 mmHg.  Comparison(s): Changes from prior study are noted. 06/27/2022: LVEF 45-50%, global hypokinesis.  FINDINGS Left Ventricle: Left ventricular ejection fraction, by estimation, is 50 to 55%. Left ventricular ejection fraction by PLAX is 55 %. The left ventricle has low normal function. The left ventricle has no regional wall motion abnormalities. The left ventricular internal cavity size was normal in size. There is no left ventricular hypertrophy. Left ventricular diastolic parameters are consistent  with Grade I diastolic dysfunction (impaired relaxation). Indeterminate filling pressures.  Right Ventricle: The right ventricular size is normal. No increase in right ventricular wall  of Systems:   Please see the history of present illness.    All other systems reviewed and are negative.     EKGs/Labs/Other Test Reviewed:   EKG: EKG performed January 2024 that I personally reviewed demonstrates sinus rhythm with frequent PVCs  EKG Interpretation Date/Time:    Ventricular Rate:    PR Interval:    QRS Duration:    QT Interval:    QTC Calculation:   R Axis:      Text Interpretation:          Prior CV studies reviewed: Cardiac Studies & Procedures     STRESS TESTS  MYOCARDIAL PERFUSION IMAGING 10/20/2018  Narrative  Nuclear stress EF: 54%.  The study is normal.  Low risk stress nuclear study with normal perfusion and normal left ventricular regional and global systolic function.   ECHOCARDIOGRAM  ECHOCARDIOGRAM COMPLETE 10/19/2022  Narrative ECHOCARDIOGRAM REPORT    Patient Name:   Jared Shea. Date of Exam: 10/19/2022 Medical Rec #:  [id number]       Height:       73.0 in Accession #:    [id number]      Weight:       179.9 lb Date of Birth:  21-Jul-1955       BSA:          2.057 m Patient Age:    66 years        BP:           125/72 mmHg Patient Gender: M               HR:           74  bpm. Exam Location:  [address]  Procedure: 2D Echo, Cardiac Doppler, Color Doppler and Strain Analysis  Indications:    R07.9 Chest pain  History:        Patient has prior history of Echocardiogram examinations, most recent 06/27/2022. Cardiomyopathy, CAD, Arrythmias:PVC, Signs/Symptoms:Chest Pain; Risk Factors:Hypertension, Diabetes, Dyslipidemia and Former Smoker.  Sonographer:    Samule Ohm RDCS Referring Phys: [id number] Orbie Pyo  IMPRESSIONS   1. Left ventricular ejection fraction, by estimation, is 50 to 55%. Left ventricular ejection fraction by PLAX is 55 %. The left ventricle has low normal function. The left ventricle has no regional wall motion abnormalities. Left ventricular diastolic parameters are consistent with Grade I diastolic dysfunction (impaired relaxation). 2. Right ventricular systolic function is normal. The right ventricular size is normal. Tricuspid regurgitation signal is inadequate for assessing PA pressure. 3. Left atrial size was moderately dilated. 4. The mitral valve is abnormal. Trivial mitral valve regurgitation. 5. The aortic valve is tricuspid. Aortic valve regurgitation is trivial. Aortic valve sclerosis/calcification is present, without any evidence of aortic stenosis. Aortic regurgitation PHT measures 514 msec. 6. The inferior vena cava is normal in size with greater than 50% respiratory variability, suggesting right atrial pressure of 3 mmHg.  Comparison(s): Changes from prior study are noted. 06/27/2022: LVEF 45-50%, global hypokinesis.  FINDINGS Left Ventricle: Left ventricular ejection fraction, by estimation, is 50 to 55%. Left ventricular ejection fraction by PLAX is 55 %. The left ventricle has low normal function. The left ventricle has no regional wall motion abnormalities. The left ventricular internal cavity size was normal in size. There is no left ventricular hypertrophy. Left ventricular diastolic parameters are consistent  with Grade I diastolic dysfunction (impaired relaxation). Indeterminate filling pressures.  Right Ventricle: The right ventricular size is normal. No increase in right ventricular wall  Cardiology Office Note:   Date:  02/01/2023  ID:  Jared Beady., DOB 21-Mar-1956, MRN [id number] PCP:  Rana Snare, DO  [facility] Providers Cardiologist:  Alverda Skeans, MD Referring MD: Rana Snare, DO   Chief Complaint/Reason for Referral: Cardiology follow-up ASSESSMENT:    1. Cardiomyopathy, unspecified type (HCC)   2. Type 2 diabetes mellitus without complication, without long-term current use of insulin (HCC)   3. Hypertension associated with diabetes (HCC)   4. Hyperlipidemia associated with type 2 diabetes mellitus (HCC)   5. Aortic atherosclerosis (HCC)   6. Coronary artery disease of native artery of native heart with stable angina pectoris (HCC)   7. PVC (premature ventricular contraction)   8. Tobacco abuse   9. BMI 25.0-25.9,adult     PLAN:   In order of problems listed above: 1.  Mild cardiomyopathy: Continue losartan and Toprol. 2.  Type 2 diabetes: Continue aspirin 81 mg daily, losartan 25 mg, atorvastatin 20 mg daily; intolerant to Jardiance due to pruritus.  Consider GLP1RA see below. 3.  Hypertension: Blood pressure is well-controlled today. 4.  Hyperlipidemia: Check lipid panel and LFTs today. 5.  Aortic atherosclerosis: Continue aspirin and atorvastatin, and pursue strict blood pressure control. 6.  Coronary artery disease: Has fortunately significant mid RCA and OM disease.  Given these noncritical locations we will continue medical therapy with Toprol and Imdur.  The patient is clinically asymptomatic. 7.  PVCs: Improved with beta-blockade. 8.  Tobacco abuse: Carotid ultrasounds in 2022 were reassuring.  CT chest abdomen pelvis in July demonstrated no abdominal aortic aneurysm. 9.  Elevated BMI: Will refer to pharmacy for recommendations regarding GLP-1 receptor agonist to decrease risk of future myocardial infarction.           Dispo:  Return in about 6 months (around 08/01/2023).      Medication Adjustments/Labs and Tests Ordered: Current  medicines are reviewed at length with the patient today.  Concerns regarding medicines are outlined above.  The following changes have been made:  no change   Labs/tests ordered: Orders Placed This Encounter  Procedures   Lipid panel   Hepatic function panel   AMB Referral to [facility]    Medication Changes: No orders of the defined types were placed in this encounter.   Current medicines are reviewed at length with the patient today.  The patient does not have concerns regarding medicines.  History of Present Illness:    FOCUSED PROBLEM LIST:   1.  Type 2 diabetes on metformin; intolerant of Jardiance due to pruritus 2.  Hypertension 3.  Aortic atherosclerosis on chest CT 2024 4.  Non-small cell lung cancer stage IV metastatic to the brain and mediastinal lymph nodes; followed by oncology; patient was treated with carboplatin, Taxol, palonosetron and XRT in 2021; s/p cemiplimab immunotherapy 5.  Mild cardiomyopathy with ejection fraction of 40 to 45% on TTE February 2024 6.  Coronary artery disease with CT FFR positive OM 2 and mid RCA lesions on CTA March 2024 7.  Hyperlipidemia; low LP(a)   March 2024: The patient is a 67 y.o. male with the indicated medical history here for recommendations regarding PVCs and incidentally noted mild cardiomyopathy.  The patient tells me that his cancer is relatively stable and he is done with immunotherapy.  He tells me he gets short of breath at times but is this does not seem to be a routine occurrence.  He is not short of breath at rest.  He denies any orthopnea, paroxysmal  of Systems:   Please see the history of present illness.    All other systems reviewed and are negative.     EKGs/Labs/Other Test Reviewed:   EKG: EKG performed January 2024 that I personally reviewed demonstrates sinus rhythm with frequent PVCs  EKG Interpretation Date/Time:    Ventricular Rate:    PR Interval:    QRS Duration:    QT Interval:    QTC Calculation:   R Axis:      Text Interpretation:          Prior CV studies reviewed: Cardiac Studies & Procedures     STRESS TESTS  MYOCARDIAL PERFUSION IMAGING 10/20/2018  Narrative  Nuclear stress EF: 54%.  The study is normal.  Low risk stress nuclear study with normal perfusion and normal left ventricular regional and global systolic function.   ECHOCARDIOGRAM  ECHOCARDIOGRAM COMPLETE 10/19/2022  Narrative ECHOCARDIOGRAM REPORT    Patient Name:   Jared Shea. Date of Exam: 10/19/2022 Medical Rec #:  [id number]       Height:       73.0 in Accession #:    [id number]      Weight:       179.9 lb Date of Birth:  21-Jul-1955       BSA:          2.057 m Patient Age:    66 years        BP:           125/72 mmHg Patient Gender: M               HR:           74  bpm. Exam Location:  [address]  Procedure: 2D Echo, Cardiac Doppler, Color Doppler and Strain Analysis  Indications:    R07.9 Chest pain  History:        Patient has prior history of Echocardiogram examinations, most recent 06/27/2022. Cardiomyopathy, CAD, Arrythmias:PVC, Signs/Symptoms:Chest Pain; Risk Factors:Hypertension, Diabetes, Dyslipidemia and Former Smoker.  Sonographer:    Samule Ohm RDCS Referring Phys: [id number] Orbie Pyo  IMPRESSIONS   1. Left ventricular ejection fraction, by estimation, is 50 to 55%. Left ventricular ejection fraction by PLAX is 55 %. The left ventricle has low normal function. The left ventricle has no regional wall motion abnormalities. Left ventricular diastolic parameters are consistent with Grade I diastolic dysfunction (impaired relaxation). 2. Right ventricular systolic function is normal. The right ventricular size is normal. Tricuspid regurgitation signal is inadequate for assessing PA pressure. 3. Left atrial size was moderately dilated. 4. The mitral valve is abnormal. Trivial mitral valve regurgitation. 5. The aortic valve is tricuspid. Aortic valve regurgitation is trivial. Aortic valve sclerosis/calcification is present, without any evidence of aortic stenosis. Aortic regurgitation PHT measures 514 msec. 6. The inferior vena cava is normal in size with greater than 50% respiratory variability, suggesting right atrial pressure of 3 mmHg.  Comparison(s): Changes from prior study are noted. 06/27/2022: LVEF 45-50%, global hypokinesis.  FINDINGS Left Ventricle: Left ventricular ejection fraction, by estimation, is 50 to 55%. Left ventricular ejection fraction by PLAX is 55 %. The left ventricle has low normal function. The left ventricle has no regional wall motion abnormalities. The left ventricular internal cavity size was normal in size. There is no left ventricular hypertrophy. Left ventricular diastolic parameters are consistent  with Grade I diastolic dysfunction (impaired relaxation). Indeterminate filling pressures.  Right Ventricle: The right ventricular size is normal. No increase in right ventricular wall  Cardiology Office Note:   Date:  02/01/2023  ID:  Jared Beady., DOB 21-Mar-1956, MRN [id number] PCP:  Rana Snare, DO  [facility] Providers Cardiologist:  Alverda Skeans, MD Referring MD: Rana Snare, DO   Chief Complaint/Reason for Referral: Cardiology follow-up ASSESSMENT:    1. Cardiomyopathy, unspecified type (HCC)   2. Type 2 diabetes mellitus without complication, without long-term current use of insulin (HCC)   3. Hypertension associated with diabetes (HCC)   4. Hyperlipidemia associated with type 2 diabetes mellitus (HCC)   5. Aortic atherosclerosis (HCC)   6. Coronary artery disease of native artery of native heart with stable angina pectoris (HCC)   7. PVC (premature ventricular contraction)   8. Tobacco abuse   9. BMI 25.0-25.9,adult     PLAN:   In order of problems listed above: 1.  Mild cardiomyopathy: Continue losartan and Toprol. 2.  Type 2 diabetes: Continue aspirin 81 mg daily, losartan 25 mg, atorvastatin 20 mg daily; intolerant to Jardiance due to pruritus.  Consider GLP1RA see below. 3.  Hypertension: Blood pressure is well-controlled today. 4.  Hyperlipidemia: Check lipid panel and LFTs today. 5.  Aortic atherosclerosis: Continue aspirin and atorvastatin, and pursue strict blood pressure control. 6.  Coronary artery disease: Has fortunately significant mid RCA and OM disease.  Given these noncritical locations we will continue medical therapy with Toprol and Imdur.  The patient is clinically asymptomatic. 7.  PVCs: Improved with beta-blockade. 8.  Tobacco abuse: Carotid ultrasounds in 2022 were reassuring.  CT chest abdomen pelvis in July demonstrated no abdominal aortic aneurysm. 9.  Elevated BMI: Will refer to pharmacy for recommendations regarding GLP-1 receptor agonist to decrease risk of future myocardial infarction.           Dispo:  Return in about 6 months (around 08/01/2023).      Medication Adjustments/Labs and Tests Ordered: Current  medicines are reviewed at length with the patient today.  Concerns regarding medicines are outlined above.  The following changes have been made:  no change   Labs/tests ordered: Orders Placed This Encounter  Procedures   Lipid panel   Hepatic function panel   AMB Referral to [facility]    Medication Changes: No orders of the defined types were placed in this encounter.   Current medicines are reviewed at length with the patient today.  The patient does not have concerns regarding medicines.  History of Present Illness:    FOCUSED PROBLEM LIST:   1.  Type 2 diabetes on metformin; intolerant of Jardiance due to pruritus 2.  Hypertension 3.  Aortic atherosclerosis on chest CT 2024 4.  Non-small cell lung cancer stage IV metastatic to the brain and mediastinal lymph nodes; followed by oncology; patient was treated with carboplatin, Taxol, palonosetron and XRT in 2021; s/p cemiplimab immunotherapy 5.  Mild cardiomyopathy with ejection fraction of 40 to 45% on TTE February 2024 6.  Coronary artery disease with CT FFR positive OM 2 and mid RCA lesions on CTA March 2024 7.  Hyperlipidemia; low LP(a)   March 2024: The patient is a 67 y.o. male with the indicated medical history here for recommendations regarding PVCs and incidentally noted mild cardiomyopathy.  The patient tells me that his cancer is relatively stable and he is done with immunotherapy.  He tells me he gets short of breath at times but is this does not seem to be a routine occurrence.  He is not short of breath at rest.  He denies any orthopnea, paroxysmal  of Systems:   Please see the history of present illness.    All other systems reviewed and are negative.     EKGs/Labs/Other Test Reviewed:   EKG: EKG performed January 2024 that I personally reviewed demonstrates sinus rhythm with frequent PVCs  EKG Interpretation Date/Time:    Ventricular Rate:    PR Interval:    QRS Duration:    QT Interval:    QTC Calculation:   R Axis:      Text Interpretation:          Prior CV studies reviewed: Cardiac Studies & Procedures     STRESS TESTS  MYOCARDIAL PERFUSION IMAGING 10/20/2018  Narrative  Nuclear stress EF: 54%.  The study is normal.  Low risk stress nuclear study with normal perfusion and normal left ventricular regional and global systolic function.   ECHOCARDIOGRAM  ECHOCARDIOGRAM COMPLETE 10/19/2022  Narrative ECHOCARDIOGRAM REPORT    Patient Name:   Jared Shea. Date of Exam: 10/19/2022 Medical Rec #:  [id number]       Height:       73.0 in Accession #:    [id number]      Weight:       179.9 lb Date of Birth:  21-Jul-1955       BSA:          2.057 m Patient Age:    66 years        BP:           125/72 mmHg Patient Gender: M               HR:           74  bpm. Exam Location:  [address]  Procedure: 2D Echo, Cardiac Doppler, Color Doppler and Strain Analysis  Indications:    R07.9 Chest pain  History:        Patient has prior history of Echocardiogram examinations, most recent 06/27/2022. Cardiomyopathy, CAD, Arrythmias:PVC, Signs/Symptoms:Chest Pain; Risk Factors:Hypertension, Diabetes, Dyslipidemia and Former Smoker.  Sonographer:    Samule Ohm RDCS Referring Phys: [id number] Orbie Pyo  IMPRESSIONS   1. Left ventricular ejection fraction, by estimation, is 50 to 55%. Left ventricular ejection fraction by PLAX is 55 %. The left ventricle has low normal function. The left ventricle has no regional wall motion abnormalities. Left ventricular diastolic parameters are consistent with Grade I diastolic dysfunction (impaired relaxation). 2. Right ventricular systolic function is normal. The right ventricular size is normal. Tricuspid regurgitation signal is inadequate for assessing PA pressure. 3. Left atrial size was moderately dilated. 4. The mitral valve is abnormal. Trivial mitral valve regurgitation. 5. The aortic valve is tricuspid. Aortic valve regurgitation is trivial. Aortic valve sclerosis/calcification is present, without any evidence of aortic stenosis. Aortic regurgitation PHT measures 514 msec. 6. The inferior vena cava is normal in size with greater than 50% respiratory variability, suggesting right atrial pressure of 3 mmHg.  Comparison(s): Changes from prior study are noted. 06/27/2022: LVEF 45-50%, global hypokinesis.  FINDINGS Left Ventricle: Left ventricular ejection fraction, by estimation, is 50 to 55%. Left ventricular ejection fraction by PLAX is 55 %. The left ventricle has low normal function. The left ventricle has no regional wall motion abnormalities. The left ventricular internal cavity size was normal in size. There is no left ventricular hypertrophy. Left ventricular diastolic parameters are consistent  with Grade I diastolic dysfunction (impaired relaxation). Indeterminate filling pressures.  Right Ventricle: The right ventricular size is normal. No increase in right ventricular wall

## 2023-02-01 ENCOUNTER — Encounter: Payer: Self-pay | Admitting: Internal Medicine

## 2023-02-01 ENCOUNTER — Ambulatory Visit: Payer: Medicare HMO | Attending: Internal Medicine | Admitting: Internal Medicine

## 2023-02-01 VITALS — BP 124/66 | HR 80 | Ht 73.0 in | Wt 182.2 lb

## 2023-02-01 DIAGNOSIS — I700 Atherosclerosis of aorta: Secondary | ICD-10-CM

## 2023-02-01 DIAGNOSIS — I25118 Atherosclerotic heart disease of native coronary artery with other forms of angina pectoris: Secondary | ICD-10-CM | POA: Diagnosis not present

## 2023-02-01 DIAGNOSIS — E119 Type 2 diabetes mellitus without complications: Secondary | ICD-10-CM

## 2023-02-01 DIAGNOSIS — Z6825 Body mass index (BMI) 25.0-25.9, adult: Secondary | ICD-10-CM | POA: Diagnosis not present

## 2023-02-01 DIAGNOSIS — E1159 Type 2 diabetes mellitus with other circulatory complications: Secondary | ICD-10-CM | POA: Diagnosis not present

## 2023-02-01 DIAGNOSIS — E1169 Type 2 diabetes mellitus with other specified complication: Secondary | ICD-10-CM

## 2023-02-01 DIAGNOSIS — I493 Ventricular premature depolarization: Secondary | ICD-10-CM | POA: Diagnosis not present

## 2023-02-01 DIAGNOSIS — I429 Cardiomyopathy, unspecified: Secondary | ICD-10-CM

## 2023-02-01 DIAGNOSIS — Z720 Tobacco use: Secondary | ICD-10-CM

## 2023-02-01 DIAGNOSIS — I152 Hypertension secondary to endocrine disorders: Secondary | ICD-10-CM

## 2023-02-01 DIAGNOSIS — E785 Hyperlipidemia, unspecified: Secondary | ICD-10-CM | POA: Diagnosis not present

## 2023-02-01 NOTE — Patient Instructions (Signed)
Medication Instructions:  No changes *If you need a refill on your cardiac medications before your next appointment, please call your pharmacy*   Lab Work: Today: lipids, liver function  If you have labs (blood work) drawn today and your tests are completely normal, you will receive your results only by: MyChart Message (if you have MyChart) OR A paper copy in the mail If you have any lab test that is abnormal or we need to change your treatment, we will call you to review the results.   Testing/Procedures: none   Follow-Up: At [facility], you and your health needs are our priority.  As part of our continuing mission to provide you with exceptional heart care, we have created designated Provider Care Teams.  These Care Teams include your primary Cardiologist (physician) and Advanced Practice Providers (APPs -  Physician Assistants and Nurse Practitioners) who all work together to provide you with the care you need, when you need it.   Your next appointment:   6 month(s)  Provider:   Jari Favre, PA-C, Ronie Spies, PA-C, Robin Searing, NP, Nada Boozer, NP, Jacolyn Reedy, PA-C, Eligha Bridegroom, NP, Tereso Newcomer, PA-C, or Perlie Gold, PA-C      Other Instructions none

## 2023-02-02 LAB — HEPATIC FUNCTION PANEL
ALT: 23 IU/L (ref 0–44)
AST: 24 IU/L (ref 0–40)
Albumin: 4.9 g/dL (ref 3.9–4.9)
Alkaline Phosphatase: 80 IU/L (ref 44–121)
Bilirubin Total: 0.5 mg/dL (ref 0.0–1.2)
Bilirubin, Direct: 0.16 mg/dL (ref 0.00–0.40)
Total Protein: 8.3 g/dL (ref 6.0–8.5)

## 2023-02-02 LAB — LIPID PANEL
Chol/HDL Ratio: 3.7 ratio (ref 0.0–5.0)
Cholesterol, Total: 137 mg/dL (ref 100–199)
HDL: 37 mg/dL — ABNORMAL LOW (ref >39)
LDL Chol Calc (NIH): 76 mg/dL (ref 0–99)
Triglycerides: 133 mg/dL (ref 0–149)
VLDL Cholesterol Cal: 24 mg/dL (ref 5–40)

## 2023-02-08 ENCOUNTER — Telehealth: Payer: Self-pay | Admitting: Internal Medicine

## 2023-02-08 ENCOUNTER — Encounter: Payer: Self-pay | Admitting: Student

## 2023-02-08 ENCOUNTER — Ambulatory Visit (INDEPENDENT_AMBULATORY_CARE_PROVIDER_SITE_OTHER): Payer: Medicare HMO | Admitting: Student

## 2023-02-08 VITALS — BP 125/73 | HR 84 | Temp 98.4°F | Wt 183.4 lb

## 2023-02-08 DIAGNOSIS — I10 Essential (primary) hypertension: Secondary | ICD-10-CM

## 2023-02-08 DIAGNOSIS — G40209 Localization-related (focal) (partial) symptomatic epilepsy and epileptic syndromes with complex partial seizures, not intractable, without status epilepticus: Secondary | ICD-10-CM | POA: Diagnosis not present

## 2023-02-08 DIAGNOSIS — I429 Cardiomyopathy, unspecified: Secondary | ICD-10-CM

## 2023-02-08 DIAGNOSIS — E119 Type 2 diabetes mellitus without complications: Secondary | ICD-10-CM

## 2023-02-08 DIAGNOSIS — Z0000 Encounter for general adult medical examination without abnormal findings: Secondary | ICD-10-CM

## 2023-02-08 DIAGNOSIS — C3491 Malignant neoplasm of unspecified part of right bronchus or lung: Secondary | ICD-10-CM

## 2023-02-08 DIAGNOSIS — Z23 Encounter for immunization: Secondary | ICD-10-CM

## 2023-02-08 LAB — POCT GLYCOSYLATED HEMOGLOBIN (HGB A1C): Hemoglobin A1C: 5.8 % — AB (ref 4.0–5.6)

## 2023-02-08 LAB — GLUCOSE, CAPILLARY: Glucose-Capillary: 152 mg/dL — ABNORMAL HIGH (ref 70–99)

## 2023-02-08 NOTE — Assessment & Plan Note (Addendum)
Lab Results  Component Value Date   HGBA1C 5.8 (A) 02/08/2023   Patient takes metformin 500 mg twice daily.  He reports he is not taking Jardiance because it causes him pruritus.  Otherwise patient denies any other acute concerns at this time.  He reports he has ophthalmology appointment scheduled on October 30 01/01/2023.  Patient is currently denying foot exam at this visit, reports he has no concerns about his feet.  He denies any numbness or tingling of his lower extremity, bilaterally. -Continue metformin 500 mg twice daily -Will get diabetic urine ACR this visit -Continue atorvastatin 20 mg daily

## 2023-02-08 NOTE — Assessment & Plan Note (Signed)
BP Readings from Last 3 Encounters:  02/08/23 125/73  02/01/23 124/66  01/08/23 138/67   Patient reports he takes metoprolol succinate 25 mg daily and losartan 25 mg daily.  He is unsure about taking Imdur 30 mg, he states that he is not taking this medication.  He reports that previously 1 doctor had started his medication and other doctor has had him stop taking this medication.  So he is unsure whether he should be taking this medication or not.  Patient is told that he should continue taking Imdur 30 mg daily.  Otherwise no acute complaints of vision changes, chest pain, shortness of breath, lower extremity swelling.  Patient denies any dizziness or lightheadedness. -Continue metoprolol succinate 25 mg daily -Continue losartan 25 mg daily -Continue Imdur 30 mg daily -Continue checking her blood pressure at home daily.

## 2023-02-08 NOTE — Progress Notes (Signed)
Established Patient Office Visit  Subjective   Patient ID: Jared Testani., male    DOB: 09/14/1955  Age: 67 y.o. MRN: [id number]  Chief Complaint  Patient presents with   Hypertension   Diabetes    HPI  This is a 67 year old male with a history stated below and presents today for HTN and DM. Please see problem based assessment and plan for additional details.  Patient Active Problem List   Diagnosis Date Noted   Cramping of hands 01/08/2023   Pruritus 09/03/2022   Sleep disorder, circadian 08/20/2022   Cardiomyopathy, unspecified (HCC) 08/20/2022   Colon cancer screening 06/20/2022   PVC (premature ventricular contraction) 05/21/2022   Health care maintenance 03/16/2022   Bilateral primary osteoarthritis of knee 12/06/2021   Erectile dysfunction 07/11/2021   Vision changes 03/14/2021   Memory loss 03/14/2021   Port-A-Cath in place 07/05/2020   Encounter for antineoplastic immunotherapy 04/18/2020   Adenocarcinoma, metastatic (HCC) 01/07/2020   Diabetes mellitus (HCC) 01/06/2020   Adjustment disorder with depressed mood 01/06/2020   Thyroid nodule 01/04/2020   Non-small cell carcinoma of lung, stage 4 w/ mets to brain (HCC) 12/31/2019   Malignant neoplasm metastatic to brain (HCC) 12/23/2019   Partial symptomatic epilepsy with complex partial seizures, not intractable, without status epilepticus (HCC) 12/09/2019   Essential hypertension 07/28/2018   Past Medical History:  Diagnosis Date   Brain mass    Bursitis of right hip    Chest pain 08/12/2018   Chronic cough    Dependence on nicotine from cigarettes    Diabetes mellitus without complication (HCC)    Dysuria 04/03/2021   Essential hypertension 07/28/2018   Frequent headaches 06/27/2020   nscl ca dx'd 12/2019   Polyarthralgia 06/15/2020   Seizures (HCC)    Viral illness 03/23/2019   Social History   Tobacco Use   Smoking status: Former    Current packs/day: 0.00    Average packs/day: 1 pack/day for  49.4 years (49.4 ttl pk-yrs)    Types: Cigarettes    Start date: 08/07/1970    Quit date: 12/15/2019    Years since quitting: 3.1   Smokeless tobacco: Never  Vaping Use   Vaping status: Never Used  Substance Use Topics   Alcohol use: Not Currently   Drug use: Never   Family Status  Relation Name Status   Mother  Alive   Father  Deceased   Sister  Alive   Brother  Alive   MGM  Deceased   MGF  Deceased   PGM  Deceased   PGF  Deceased   Brother  Alive   Sister  Alive   Daughter  Alive   Daughter  Alive   Son  Alive  No partnership data on file   Family History  Problem Relation Age of Onset   Heart disease Mother        CABG   Kidney disease Sister    Diabetes Brother    Kidney disease Brother        KIDNEY TRANSPLANT   Hypertension Sister    Healthy Daughter    Healthy Daughter    Healthy Son    Allergies  Allergen Reactions   Jardiance [Empagliflozin]     Caused diarrhea & itching, so we stopped   Penicillins Rash    Did it involve swelling of the face/tongue/throat, SOB, or low BP? N Did it involve sudden or severe rash/hives, skin peeling, or any reaction on the inside of your  mouth or nose? Y Did you need to seek medical attention at a hospital or doctor's office? Y When did it last happen?    childhood   If all above answers are "NO", may proceed with cephalosporin use.      Review of Systems  Constitutional:  Negative for chills and fever.  HENT:  Negative for congestion and sore throat.   Eyes:  Negative for blurred vision.  Respiratory:  Negative for cough and sputum production.   Cardiovascular:  Negative for chest pain, palpitations and leg swelling.  Gastrointestinal:  Negative for abdominal pain, blood in stool, diarrhea, nausea and vomiting.  Genitourinary:  Negative for dysuria.  Neurological:  Negative for dizziness and weakness.   Objective:     BP 125/73 (BP Location: Right Arm, Patient Position: Sitting, Cuff Size: Normal)   Pulse 84    Temp 98.4 F (36.9 C) (Oral)   Wt 183 lb 6.4 oz (83.2 kg)   SpO2 99%   BMI 24.20 kg/m  BP Readings from Last 3 Encounters:  02/08/23 125/73  02/01/23 124/66  01/08/23 138/67   Wt Readings from Last 3 Encounters:  02/08/23 183 lb 6.4 oz (83.2 kg)  02/01/23 182 lb 3.2 oz (82.6 kg)  01/08/23 185 lb 6.4 oz (84.1 kg)    Physical Exam  Constitutional: well-appearing sitting in chair, in no acute distress HENT: normocephalic atraumatic, mucous membranes moist Cardiovascular: regular rate and rhythm, no m/r/g Pulmonary/Chest: normal work of breathing on room air, lungs clear to auscultation bilaterally Abdominal: soft, non-tender, non-distended MSK: normal bulk and tone Neurological: alert & oriented x 3, no focal deficit Skin: warm and dry Psych: normal mood and behavior  Results for orders placed or performed in visit on 02/08/23  Glucose, capillary  Result Value Ref Range   Glucose-Capillary 152 (H) 70 - 99 mg/dL  POC Hbg Z6X  Result Value Ref Range   Hemoglobin A1C 5.8 (A) 4.0 - 5.6 %   HbA1c POC (<> result, manual entry)     HbA1c, POC (prediabetic range)     HbA1c, POC (controlled diabetic range)      Last CBC Lab Results  Component Value Date   WBC 6.1 11/28/2022   HGB 12.1 (L) 11/28/2022   HCT 35.9 (L) 11/28/2022   MCV 86.9 11/28/2022   MCH 29.3 11/28/2022   RDW 18.0 (H) 11/28/2022   PLT 307 11/28/2022   Last metabolic panel Lab Results  Component Value Date   GLUCOSE 191 (H) 12/25/2022   NA 140 12/25/2022   K 3.8 12/25/2022   CL 103 12/25/2022   CO2 20 12/25/2022   BUN 16 12/25/2022   CREATININE 0.74 (L) 12/25/2022   EGFR 99 12/25/2022   CALCIUM 9.2 12/25/2022   PROT 8.3 02/01/2023   ALBUMIN 4.9 02/01/2023   BILITOT 0.5 02/01/2023   ALKPHOS 80 02/01/2023   AST 24 02/01/2023   ALT 23 02/01/2023   ANIONGAP 7 11/28/2022   Last lipids Lab Results  Component Value Date   CHOL 137 02/01/2023   HDL 37 (L) 02/01/2023   LDLCALC 76 02/01/2023    TRIG 133 02/01/2023   CHOLHDL 3.7 02/01/2023   Last hemoglobin A1c Lab Results  Component Value Date   HGBA1C 5.8 (A) 02/08/2023      The 10-year ASCVD risk score (Arnett DK, et al., 2019) is: 46.6%    Assessment & Plan:   Problem List Items Addressed This Visit       Cardiovascular and Mediastinum  Essential hypertension    BP Readings from Last 3 Encounters:  02/08/23 125/73  02/01/23 124/66  01/08/23 138/67   Patient reports he takes metoprolol succinate 25 mg daily and losartan 25 mg daily.  He is unsure about taking Imdur 30 mg, he states that he is not taking this medication.  He reports that previously 1 doctor had started his medication and other doctor has had him stop taking this medication.  So he is unsure whether he should be taking this medication or not.  Patient is told that he should continue taking Imdur 30 mg daily.  Otherwise no acute complaints of vision changes, chest pain, shortness of breath, lower extremity swelling.  Patient denies any dizziness or lightheadedness. -Continue metoprolol succinate 25 mg daily -Continue losartan 25 mg daily -Continue Imdur 30 mg daily -Continue checking her blood pressure at home daily.      Cardiomyopathy, unspecified (HCC)    He follows cardiology, last visit on 9/20.  Medication regimen includes Imdur 30 mg daily, metoprolol succinate 25 mg daily, losartan 25 mg daily.  Patient is advised to continue following up with cardiology.  Continue taking the medications as prescribed.        Respiratory   Non-small cell carcinoma of lung, stage 4 w/ mets to brain [facility])    Patient with stage 4 NSCC of lung. He had a solitary brain metastasis that was treated with R craniotomy. He is s/p chemoradiation and is following oncology regularly. Repeat MRI 7/6 brain showed no changes.  -Continue following oncology and follow their recommendations.        Endocrine   Diabetes mellitus (HCC) - Primary    Lab Results  Component  Value Date   HGBA1C 5.8 (A) 02/08/2023   Patient takes metformin 500 mg twice daily.  He reports he is not taking Jardiance because it causes him pruritus.  Otherwise patient denies any other acute concerns at this time.  He reports he has ophthalmology appointment scheduled on October 30 01/01/2023.  Patient is currently denying foot exam at this visit, reports he has no concerns about his feet.  He denies any numbness or tingling of his lower extremity, bilaterally. -Continue metformin 500 mg twice daily -Will get diabetic urine ACR this visit -Continue atorvastatin 20 mg daily       Relevant Orders   POC Hbg A1C (Completed)   Microalbumin / Creatinine Urine Ratio     Nervous and Auditory   Partial symptomatic epilepsy with complex partial seizures, not intractable, without status epilepticus (HCC)    Continue taking Keppra 750 mg twice daily.        Other   Health care maintenance    Patient would like to get a flu vaccine this visit.       Return in about 3 months (around 05/10/2023) for Follow-up on HTN, DM.    Jeral Pinch, DO

## 2023-02-08 NOTE — Assessment & Plan Note (Signed)
Patient would like to get a flu vaccine this visit.

## 2023-02-08 NOTE — Assessment & Plan Note (Signed)
Continue taking Keppra 750 mg twice daily.

## 2023-02-08 NOTE — Assessment & Plan Note (Signed)
>>  ASSESSMENT AND PLAN FOR CARDIOMYOPATHY, UNSPECIFIED (HCC) WRITTEN ON 02/08/2023 11:28 AM BY HEDDY, ROYA, DO  He follows cardiology, last visit on 9/20.  Medication regimen includes Imdur  30 mg daily, metoprolol  succinate 25 mg daily, losartan  25 mg daily.  Patient is advised to continue following up with cardiology.  Continue taking the medications as prescribed.

## 2023-02-08 NOTE — Assessment & Plan Note (Signed)
Patient with stage 4 NSCC of lung. He had a solitary brain metastasis that was treated with R craniotomy. He is s/p chemoradiation and is following oncology regularly. Repeat MRI 7/6 brain showed no changes.  -Continue following oncology and follow their recommendations.

## 2023-02-08 NOTE — Assessment & Plan Note (Signed)
He follows cardiology, last visit on 9/20.  Medication regimen includes Imdur 30 mg daily, metoprolol succinate 25 mg daily, losartan 25 mg daily.  Patient is advised to continue following up with cardiology.  Continue taking the medications as prescribed.

## 2023-02-08 NOTE — Telephone Encounter (Signed)
Patient is returning call to discuss lab results. 

## 2023-02-08 NOTE — Patient Instructions (Addendum)
Thank you, [name] for allowing [place] to provide your care today. Today we discussed:   High Blood pressure and Heart -Metoprolol succinate 25 mg daily -Losartan 25 mg daily -Imdur 30 mg daily  2.  Diabetes -Metformin 500 mg, 1 tablet twice a day  3.  Cholesterol -Atorvastatin 20 mg daily  4.  Seizure -Keppra 750 mg 1 tablet twice daily  I have ordered the following labs for you:  Lab Orders         Glucose, capillary         Microalbumin / Creatinine Urine Ratio         POC Hbg A1C       Referrals ordered today:   Referral Orders  No referral(s) requested today     I have ordered the following medication/changed the following medications:   Stop the following medications: There are no discontinued medications.   Start the following medications: No orders of the defined types were placed in this encounter.    Follow up: 2-3 months    Remember:   Should you have any questions or concerns please call the internal medicine clinic at [phone].     Jeral Pinch, DO [facility]

## 2023-02-08 NOTE — Assessment & Plan Note (Signed)
>>  ASSESSMENT AND PLAN FOR UNCONTROLLED TYPE 2 DIABETES MELLITUS WITH HYPERGLYCEMIA, WITHOUT LONG-TERM CURRENT USE OF INSULIN  (HCC) WRITTEN ON 02/08/2023 11:32 AM BY TAWKALIYAR, ROYA, DO  Lab Results  Component Value Date   HGBA1C 5.8 (A) 02/08/2023   Patient takes metformin  500 mg twice daily.  He reports he is not taking Jardiance  because it causes him pruritus.  Otherwise patient denies any other acute concerns at this time.  He reports he has ophthalmology appointment scheduled on October 30 01/01/2023.  Patient is currently denying foot exam at this visit, reports he has no concerns about his feet.  He denies any numbness or tingling of his lower extremity, bilaterally. -Continue metformin  500 mg twice daily -Will get diabetic urine ACR this visit -Continue atorvastatin  20 mg daily

## 2023-02-09 LAB — MICROALBUMIN / CREATININE URINE RATIO
Creatinine, Urine: 188.5 mg/dL
Microalb/Creat Ratio: 9 mg/g{creat} (ref 0–29)
Microalbumin, Urine: 17 ug/mL

## 2023-02-11 NOTE — Telephone Encounter (Signed)
I spoke w the patient.  He asked me to call his sister, Jolyne Loa and discuss since she handles his meds for him.  She did not answer.  Call went to VM.  Left her message to call back.

## 2023-02-11 NOTE — Addendum Note (Signed)
Addended by: Dickie La on: 02/11/2023 10:16 AM   Modules accepted: Level of Service

## 2023-02-11 NOTE — Progress Notes (Signed)
Internal Medicine Clinic Attending  I was physically present during the key portions of the resident provided service and participated in the medical decision making of patient's management care. I reviewed pertinent patient test results.  The assessment, diagnosis, and plan were formulated together and I agree with the documentation in the resident's note.  Lau, Grace, MD  

## 2023-02-14 NOTE — Telephone Encounter (Signed)
Spoke w patient's sister, Cira Rue.  She does not recall patient even being on the atorvastatin.  Their other sister, Gurshan Settlemire, helps him w medicines now so Cira Rue is calling to her to verify if he is on it.  His LDL jumped up from 23 six months ago to 76 now.   He is seeing PharmD next Tue.

## 2023-02-19 ENCOUNTER — Encounter: Payer: Self-pay | Admitting: Pharmacist

## 2023-02-19 ENCOUNTER — Ambulatory Visit: Payer: Medicare HMO | Attending: Internal Medicine | Admitting: Pharmacist

## 2023-02-19 DIAGNOSIS — E785 Hyperlipidemia, unspecified: Secondary | ICD-10-CM | POA: Insufficient documentation

## 2023-02-19 DIAGNOSIS — E1169 Type 2 diabetes mellitus with other specified complication: Secondary | ICD-10-CM

## 2023-02-19 DIAGNOSIS — I10 Essential (primary) hypertension: Secondary | ICD-10-CM | POA: Diagnosis not present

## 2023-02-19 HISTORY — DX: Hyperlipidemia, unspecified: E78.5

## 2023-02-19 MED ORDER — LOSARTAN POTASSIUM 25 MG PO TABS: 25.0000 mg | ORAL_TABLET | Freq: Every day | ORAL | 3 refills | Status: DC

## 2023-02-19 NOTE — Assessment & Plan Note (Addendum)
Assessment: LDL-C is above goal of <55 Previously well controlled. I imagine patient has been off of atorvastatin Patient and family unsure Little exercise Appetite is on and off per patient. Eats about twice per day  Plan:  Resume atorvastatin 20 mg daily Recheck labs in 2 months Timed medication list was given to patient He was encouraged to increase exercise He was encouraged to use a med box

## 2023-02-19 NOTE — Patient Instructions (Addendum)
Breakfast: (9 AM) Metformin 500mg  (blood sugar) Isosorbide 30mg  (heart) Losartan (blood pressure) Aspirin (heart) Levetiracetam (seizures)  Dinner: Metformin 500mg    Bedtime (10 PM) Metoprolol succinate (blood pressure, heart rate) Levetiracetam 750mg  (seizures) Atorvastatin 20mg  (cholesterol) Melatonin (sleep)  Fasting labs on Tue Dec 10

## 2023-02-19 NOTE — Progress Notes (Signed)
Patient ID: Jared Shea.                 DOB: July 15, 1955                    MRN: [id number]      HPI: Jared Shea. is a 67 y.o. male patient referred to lipid clinic by Dr.Thukkani. PMH is significant for DM, HTN, HLD, CAD (CT FFR analysis shows significant functional stenosis at OM2 and mid RCA), PVC, non-small cell carcinoma of the lung stage IV, mild cardiomyopathy (EF 40-45%) and tobacco abuse. Latest A1C 5.8. LDL-C 76 (previously 23). Awaiting clarification from sister as to what medications he has been taking.   Patient presents today accompanied by his Sister Jared Shea.  Patient brings in his medication bottles.  He states he took his last dose of losartan today and not sure if he needs to continue to take it.  Thought one of his other medications replaced it. He also states that he only took 1 or 2 atorvastatin as he is not sure if he supposed to be on it.  He used to make a pillbox but does not anymore.  Discussed that his latest LDL-C was elevated and significantly higher than previous.  Suspect he was not taking the atorvastatin.  Fill history in epic would support this.  Patient does not do any physical activity.  Patient was also referred to talk about GLP-1 therapy.  Given his metastatic lung cancer (brain metastasis), significantly well-controlled A1c (5.8), normal body weight and lack of appetite I did not feel as though this was the best medication option for patient. I did discuss it with patient as well, who declined therapy.   Current Medications: atorvastatin 20mg  Intolerances: None Risk Factors:CAD, DM, HTN  LDL-C goal: <55  Diet: eats twice per day  Exercise: None  Family History:  Family History  Problem Relation Age of Onset   Heart disease Mother        CABG   Kidney disease Sister    Diabetes Brother    Kidney disease Brother        KIDNEY TRANSPLANT   Hypertension Sister    Healthy Daughter    Healthy Daughter    Healthy Son      Social History: no  tobacco, rare ETOH use, no illict drugs  Labs: Lipid Panel     Component Value Date/Time   CHOL 137 02/01/2023 0952   TRIG 133 02/01/2023 0952   HDL 37 (L) 02/01/2023 0952   CHOLHDL 3.7 02/01/2023 0952   LDLCALC 76 02/01/2023 0952   LABVLDL 24 02/01/2023 0952    Past Medical History:  Diagnosis Date   Brain mass    Bursitis of right hip    Chest pain 08/12/2018   Chronic cough    Dependence on nicotine from cigarettes    Diabetes mellitus without complication (HCC)    Dysuria 04/03/2021   Essential hypertension 07/28/2018   Frequent headaches 06/27/2020   nscl ca dx'd 12/2019   Polyarthralgia 06/15/2020   Seizures (HCC)    Viral illness 03/23/2019    Current Outpatient Medications on File Prior to Visit  Medication Sig Dispense Refill   aspirin EC 81 MG tablet Take 1 tablet (81 mg total) by mouth daily. Swallow whole.     atorvastatin (LIPITOR) 20 MG tablet Take 1 tablet (20 mg total) by mouth daily. 90 tablet 3   isosorbide mononitrate (IMDUR) 30 MG 24 hr tablet Take 1 tablet (  30 mg total) by mouth daily. 90 tablet 3   levETIRAcetam (KEPPRA) 750 MG tablet Take 1 tablet (750 mg total) by mouth 2 (two) times daily. 180 tablet 4   melatonin 5 MG TABS Take 1 tablet (5 mg total) by mouth at bedtime as needed (for sleep). 30 tablet 0   metFORMIN (GLUCOPHAGE) 500 MG tablet Take 1 tablet (500 mg total) by mouth 2 (two) times daily with a meal. 120 tablet 2   methocarbamol (ROBAXIN) 500 MG tablet Take 500 mg by mouth every 8 (eight) hours as needed for muscle spasms.     metoprolol succinate (TOPROL XL) 25 MG 24 hr tablet Take 1 tablet (25 mg total) by mouth at bedtime. 90 tablet 3   Blood Glucose Monitoring Suppl (ACCU-CHEK GUIDE) w/Device KIT Use glucose meter to check your blood sugar. 1 kit 0   glucose blood test strip Use a new test strip each time to check your blood sugar. 100 each 12   hydrOXYzine (ATARAX) 10 MG tablet TAKE 1 TABLET(10 MG) BY MOUTH THREE TIMES DAILY AS  NEEDED 30 tablet 0   Lancets (ONETOUCH DELICA PLUS LANCET30G) MISC Use a new lancet each time to check your blood sugar. 100 each 11   No current facility-administered medications on file prior to visit.    Allergies  Allergen Reactions   Jardiance [Empagliflozin]     Caused diarrhea & itching, so we stopped   Penicillins Rash    Did it involve swelling of the face/tongue/throat, SOB, or low BP? N Did it involve sudden or severe rash/hives, skin peeling, or any reaction on the inside of your mouth or nose? Y Did you need to seek medical attention at a hospital or doctor's office? Y When did it last happen?    childhood   If all above answers are "NO", may proceed with cephalosporin use.     Assessment/Plan:  1. Hyperlipidemia -  Hyperlipidemia Assessment: LDL-C is above goal of <55 Previously well controlled. I imagine patient has been off of atorvastatin Patient and family unsure Little exercise Appetite is on and off per patient. Eats about twice per day  Plan:  Resume atorvastatin 20 mg daily Recheck labs in 2 months Timed medication list was given to patient He was encouraged to increase exercise He was encouraged to use a med box   Thank you,  Olene Floss, Pharm.D, BCACP, BCPS, CPP [facility] [address] [place], [place] [address]  Phone: [phone]; Fax: [phone]

## 2023-02-20 ENCOUNTER — Inpatient Hospital Stay: Payer: Medicare HMO | Attending: Internal Medicine

## 2023-02-20 DIAGNOSIS — C7931 Secondary malignant neoplasm of brain: Secondary | ICD-10-CM | POA: Insufficient documentation

## 2023-02-20 DIAGNOSIS — C3490 Malignant neoplasm of unspecified part of unspecified bronchus or lung: Secondary | ICD-10-CM | POA: Insufficient documentation

## 2023-02-22 ENCOUNTER — Inpatient Hospital Stay: Payer: Medicare HMO

## 2023-02-22 DIAGNOSIS — Z95828 Presence of other vascular implants and grafts: Secondary | ICD-10-CM

## 2023-02-22 DIAGNOSIS — C7931 Secondary malignant neoplasm of brain: Secondary | ICD-10-CM | POA: Diagnosis not present

## 2023-02-22 DIAGNOSIS — C3490 Malignant neoplasm of unspecified part of unspecified bronchus or lung: Secondary | ICD-10-CM | POA: Diagnosis not present

## 2023-02-22 MED ORDER — HEPARIN SOD (PORK) LOCK FLUSH 100 UNIT/ML IV SOLN
500.0000 [IU] | Freq: Once | INTRAVENOUS | Status: AC
  Administered 2023-02-22: 500 [IU]

## 2023-02-22 MED ORDER — SODIUM CHLORIDE 0.9% FLUSH
10.0000 mL | Freq: Once | INTRAVENOUS | Status: AC
  Administered 2023-02-22: 10 mL

## 2023-03-06 ENCOUNTER — Encounter: Payer: Self-pay | Admitting: Internal Medicine

## 2023-03-11 ENCOUNTER — Telehealth: Payer: Self-pay | Admitting: *Deleted

## 2023-03-11 ENCOUNTER — Other Ambulatory Visit: Payer: Self-pay

## 2023-03-11 ENCOUNTER — Ambulatory Visit (INDEPENDENT_AMBULATORY_CARE_PROVIDER_SITE_OTHER): Payer: Medicare HMO | Admitting: Student

## 2023-03-11 DIAGNOSIS — K921 Melena: Secondary | ICD-10-CM | POA: Diagnosis not present

## 2023-03-11 NOTE — Telephone Encounter (Signed)
Walk-in. Stated he woke up yesterday with blood in his urine. He has not noticed any this am. Added to Dr Nooruddin's schedule.

## 2023-03-11 NOTE — Patient Instructions (Addendum)
Thank you so much for coming to the clinic today!   I'm glad you're feeling well and have had no other issues. I recommend keep using the stool softener as you  need it (only if you're feeling constipated), and I will give you a hand out on increased fiber. If it happens again we'd need to further evaluate, so please let us know.   If you have any questions please feel free to the call the clinic at anytime at [phone]. It was a pleasure seeing you!  Best, Dr. Thomasene Ripple

## 2023-03-12 DIAGNOSIS — K921 Melena: Secondary | ICD-10-CM | POA: Insufficient documentation

## 2023-03-12 NOTE — Assessment & Plan Note (Signed)
Patient presents with a chief complaint of 1 episode of blood on his toilet paper that occurred on Saturday.  He states 2 weeks prior to that he has been constipated, and on Saturday decided to take a stool softener which helped him have a bowel movement, but when he was wiping he noticed bright red blood.  This is not reoccurred, and he has been having normal regular bowel movements.  Patient denied digital rectal exam today, and per chart review he has declined colonoscopy.  I offered both of them to him as options for further evaluation however he declined.  Hemoglobin also stable 3 months ago at 12.2.  Denies any dizziness, abdominal pain, or pain with bowel movements.  Plan: - Discussed with patient that if symptoms recur, will need rectal exam and potential colonoscopy

## 2023-03-12 NOTE — Progress Notes (Signed)
CC: 1 episode of blood in stool  HPI:  Jared Shea. is a 67 y.o. male living with a history stated below and presents today for blood in stool. Please see problem based assessment and plan for additional details.  Past Medical History:  Diagnosis Date   Brain mass    Bursitis of right hip    Chest pain 08/12/2018   Chronic cough    Dependence on nicotine from cigarettes    Diabetes mellitus without complication (HCC)    Dysuria 04/03/2021   Essential hypertension 07/28/2018   Frequent headaches 06/27/2020   nscl ca dx'd 12/2019   Polyarthralgia 06/15/2020   Seizures (HCC)    Viral illness 03/23/2019    Current Outpatient Medications on File Prior to Visit  Medication Sig Dispense Refill   aspirin EC 81 MG tablet Take 1 tablet (81 mg total) by mouth daily. Swallow whole.     atorvastatin (LIPITOR) 20 MG tablet Take 1 tablet (20 mg total) by mouth daily. 90 tablet 3   Blood Glucose Monitoring Suppl (ACCU-CHEK GUIDE) w/Device KIT Use glucose meter to check your blood sugar. 1 kit 0   glucose blood test strip Use a new test strip each time to check your blood sugar. 100 each 12   hydrOXYzine (ATARAX) 10 MG tablet TAKE 1 TABLET(10 MG) BY MOUTH THREE TIMES DAILY AS NEEDED 30 tablet 0   isosorbide mononitrate (IMDUR) 30 MG 24 hr tablet Take 1 tablet (30 mg total) by mouth daily. 90 tablet 3   Lancets (ONETOUCH DELICA PLUS LANCET30G) MISC Use a new lancet each time to check your blood sugar. 100 each 11   levETIRAcetam (KEPPRA) 750 MG tablet Take 1 tablet (750 mg total) by mouth 2 (two) times daily. 180 tablet 4   losartan (COZAAR) 25 MG tablet Take 1 tablet (25 mg total) by mouth daily. 90 tablet 3   melatonin 5 MG TABS Take 1 tablet (5 mg total) by mouth at bedtime as needed (for sleep). 30 tablet 0   metFORMIN (GLUCOPHAGE) 500 MG tablet Take 1 tablet (500 mg total) by mouth 2 (two) times daily with a meal. 120 tablet 2   methocarbamol (ROBAXIN) 500 MG tablet Take 500 mg by  mouth every 8 (eight) hours as needed for muscle spasms.     metoprolol succinate (TOPROL XL) 25 MG 24 hr tablet Take 1 tablet (25 mg total) by mouth at bedtime. 90 tablet 3   No current facility-administered medications on file prior to visit.    Family History  Problem Relation Age of Onset   Heart disease Mother        CABG   Kidney disease Sister    Diabetes Brother    Kidney disease Brother        KIDNEY TRANSPLANT   Hypertension Sister    Healthy Daughter    Healthy Daughter    Healthy Son     Social History   Socioeconomic History   Marital status: Divorced    Spouse name: Not on file   Number of children: Not on file   Years of education: Not on file   Highest education level: Not on file  Occupational History   Not on file  Tobacco Use   Smoking status: Former    Current packs/day: 0.00    Average packs/day: 1 pack/day for 49.4 years (49.4 ttl pk-yrs)    Types: Cigarettes    Start date: 08/07/1970    Quit date: 12/15/2019  Years since quitting: 3.2   Smokeless tobacco: Never  Vaping Use   Vaping status: Never Used  Substance and Sexual Activity   Alcohol use: Not Currently   Drug use: Never   Sexual activity: Not on file    Comment: YES  Other Topics Concern   Not on file  Social History Narrative   Not on file   Social Determinants of Health   Financial Resource Strain: Low Risk  (09/03/2022)   Overall Financial Resource Strain (CARDIA)    Difficulty of Paying Living Expenses: Not very hard  Food Insecurity: No Food Insecurity (09/03/2022)   Hunger Vital Sign    Worried About Running Out of Food in the Last Year: Never true    Ran Out of Food in the Last Year: Never true  Transportation Needs: No Transportation Needs (09/03/2022)   PRAPARE - Administrator, Civil Service (Medical): No    Lack of Transportation (Non-Medical): No  Physical Activity: Insufficiently Active (09/03/2022)   Exercise Vital Sign    Days of Exercise per Week: 3  days    Minutes of Exercise per Session: 30 min  Stress: No Stress Concern Present (09/03/2022)   [employer] of Occupational Health - Occupational Stress Questionnaire    Feeling of Stress : Not at all  Social Connections: Moderately Isolated (09/03/2022)   Social Connection and Isolation Panel [NHANES]    Frequency of Communication with Friends and Family: More than three times a week    Frequency of Social Gatherings with Friends and Family: Once a week    Attends Religious Services: More than 4 times per year    Active Member of [employer] or Organizations: No    Attends Banker Meetings: Never    Marital Status: Separated  Intimate Partner Violence: Not At Risk (09/03/2022)   Humiliation, Afraid, Rape, and Kick questionnaire    Fear of Current or Ex-Partner: No    Emotionally Abused: No    Physically Abused: No    Sexually Abused: No    Review of Systems: ROS negative except for what is noted on the assessment and plan.  There were no vitals filed for this visit.  Physical Exam: Constitutional: well-appearing male in no acute distress Cardiovascular: regular rate and rhythm, no m/r/g Pulmonary/Chest: normal work of breathing on room air, lungs clear to auscultation bilaterally Abdominal: soft, non-tender, non-distended Rectal: Patient declined rectal exam  Assessment & Plan:   Blood in stool Patient presents with a chief complaint of 1 episode of blood on his toilet paper that occurred on Saturday.  He states 2 weeks prior to that he has been constipated, and on Saturday decided to take a stool softener which helped him have a bowel movement, but when he was wiping he noticed bright red blood.  This is not reoccurred, and he has been having normal regular bowel movements.  Patient denied digital rectal exam today, and per chart review he has declined colonoscopy.  I offered both of them to him as options for further evaluation however he declined.  Hemoglobin  also stable 3 months ago at 12.2.  Denies any dizziness, abdominal pain, or pain with bowel movements.  Plan: - Discussed with patient that if symptoms recur, will need rectal exam and potential colonoscopy  Patient discussed with Dr. Lynford Humphrey, M.D. [facility], PGY-2 Pager: [phone] Date 03/12/2023 Time 7:30 AM

## 2023-03-13 DIAGNOSIS — H5203 Hypermetropia, bilateral: Secondary | ICD-10-CM | POA: Diagnosis not present

## 2023-03-13 LAB — HM DIABETES EYE EXAM

## 2023-03-15 NOTE — Progress Notes (Signed)
Internal Medicine Clinic Attending  Case discussed with the resident at the time of the visit.  We reviewed the resident's history and exam and pertinent patient test results.  I agree with the assessment, diagnosis, and plan of care documented in the resident's note.  

## 2023-03-15 NOTE — Addendum Note (Signed)
Addended by: Burnell Blanks on: 03/15/2023 03:05 PM   Modules accepted: Level of Service

## 2023-03-25 ENCOUNTER — Encounter (HOSPITAL_COMMUNITY): Payer: Self-pay

## 2023-03-25 ENCOUNTER — Ambulatory Visit (HOSPITAL_COMMUNITY)
Admission: RE | Admit: 2023-03-25 | Discharge: 2023-03-25 | Disposition: A | Discharge status: Home / Self Care | Payer: Medicare HMO | Source: Ambulatory Visit | Attending: Physician Assistant | Admitting: Physician Assistant

## 2023-03-25 ENCOUNTER — Inpatient Hospital Stay: Payer: Medicare HMO | Attending: Internal Medicine

## 2023-03-25 DIAGNOSIS — C3491 Malignant neoplasm of unspecified part of right bronchus or lung: Secondary | ICD-10-CM | POA: Diagnosis not present

## 2023-03-25 DIAGNOSIS — K8020 Calculus of gallbladder without cholecystitis without obstruction: Secondary | ICD-10-CM | POA: Diagnosis not present

## 2023-03-25 DIAGNOSIS — Z923 Personal history of irradiation: Secondary | ICD-10-CM | POA: Insufficient documentation

## 2023-03-25 DIAGNOSIS — C3490 Malignant neoplasm of unspecified part of unspecified bronchus or lung: Secondary | ICD-10-CM | POA: Insufficient documentation

## 2023-03-25 DIAGNOSIS — Z9221 Personal history of antineoplastic chemotherapy: Secondary | ICD-10-CM | POA: Insufficient documentation

## 2023-03-25 DIAGNOSIS — I700 Atherosclerosis of aorta: Secondary | ICD-10-CM | POA: Diagnosis not present

## 2023-03-25 DIAGNOSIS — Z95828 Presence of other vascular implants and grafts: Secondary | ICD-10-CM

## 2023-03-25 DIAGNOSIS — C7931 Secondary malignant neoplasm of brain: Secondary | ICD-10-CM | POA: Insufficient documentation

## 2023-03-25 DIAGNOSIS — Z87891 Personal history of nicotine dependence: Secondary | ICD-10-CM | POA: Insufficient documentation

## 2023-03-25 LAB — CMP (CANCER CENTER ONLY)
ALT: 19 U/L (ref 0–44)
AST: 22 U/L (ref 15–41)
Albumin: 4.5 g/dL (ref 3.5–5.0)
Alkaline Phosphatase: 61 U/L (ref 38–126)
Anion gap: 6 (ref 5–15)
BUN: 13 mg/dL (ref 8–23)
CO2: 27 mmol/L (ref 22–32)
Calcium: 9.5 mg/dL (ref 8.9–10.3)
Chloride: 107 mmol/L (ref 98–111)
Creatinine: 0.68 mg/dL (ref 0.61–1.24)
GFR, Estimated: >60 mL/min (ref >60)
Glucose, Bld: 138 mg/dL — ABNORMAL HIGH (ref 70–99)
Potassium: 3.9 mmol/L (ref 3.5–5.1)
Sodium: 140 mmol/L (ref 135–145)
Total Bilirubin: 0.8 mg/dL (ref <1.2)
Total Protein: 7.7 g/dL (ref 6.5–8.1)

## 2023-03-25 LAB — CBC WITH DIFFERENTIAL (CANCER CENTER ONLY)
Abs Immature Granulocytes: 0.01 10*3/uL (ref 0.00–0.07)
Basophils Absolute: 0 10*3/uL (ref 0.0–0.1)
Basophils Relative: 0 %
Eosinophils Absolute: 0.1 10*3/uL (ref 0.0–0.5)
Eosinophils Relative: 1 %
HCT: 35.8 % — ABNORMAL LOW (ref 39.0–52.0)
Hemoglobin: 12.2 g/dL — ABNORMAL LOW (ref 13.0–17.0)
Immature Granulocytes: 0 %
Lymphocytes Relative: 20 %
Lymphs Abs: 1.4 10*3/uL (ref 0.7–4.0)
MCH: 29.6 pg (ref 26.0–34.0)
MCHC: 34.1 g/dL (ref 30.0–36.0)
MCV: 86.9 fL (ref 80.0–100.0)
Monocytes Absolute: 0.5 10*3/uL (ref 0.1–1.0)
Monocytes Relative: 7 %
Neutro Abs: 5 10*3/uL (ref 1.7–7.7)
Neutrophils Relative %: 72 %
Platelet Count: 301 10*3/uL (ref 150–400)
RBC: 4.12 MIL/uL — ABNORMAL LOW (ref 4.22–5.81)
RDW: 15.2 % (ref 11.5–15.5)
WBC Count: 7 10*3/uL (ref 4.0–10.5)
nRBC: 0 % (ref 0.0–0.2)

## 2023-03-25 MED ORDER — IOHEXOL 300 MG/ML  SOLN
100.0000 mL | Freq: Once | INTRAMUSCULAR | Status: AC | PRN
  Administered 2023-03-25: 100 mL via INTRAVENOUS

## 2023-03-25 MED ORDER — SODIUM CHLORIDE 0.9% FLUSH
10.0000 mL | Freq: Once | INTRAVENOUS | Status: AC
  Administered 2023-03-25: 10 mL

## 2023-03-25 MED ORDER — HEPARIN SOD (PORK) LOCK FLUSH 100 UNIT/ML IV SOLN
500.0000 [IU] | Freq: Once | INTRAVENOUS | Status: AC
  Administered 2023-03-25: 500 [IU] via INTRAVENOUS

## 2023-03-25 MED ORDER — HEPARIN SOD (PORK) LOCK FLUSH 100 UNIT/ML IV SOLN
INTRAVENOUS | Status: AC
  Filled 2023-03-25: qty 5

## 2023-03-27 NOTE — Progress Notes (Deleted)
[facility] OFFICE PROGRESS NOTE  Rana Snare, DO [address] [place] [address]  DIAGNOSIS: Stage IV (TX, N2, M1 C) non-small cell lung cancer, adenocarcinoma diagnosed in August 2021 and presented with solitary brain metastasis in addition to mediastinal lymphadenopathy.   PDL1 Expression 70%   Molecular Biomarkers:  Tumor Mutational Burden - 52 Muts/Mb Microsatellite status - MS-Stable Genomic Findings For a complete list of the genes assayed, please refer to the Appendix. NF1 E1694* MTAP loss exons 2-8 RICTOR amplification ATRX A419V BRAF K483E CDKN2A/B CDKN2A loss, CDKN2B loss DNMT3A E205* FGF10 amplification NTRK1 amplification - equivocal? 7 Disease relevant genes with no reportable alterations: ALK, EGFR, ERBB2, KRAS, MET, RET, ROS1  PRIOR THERAPY: 1) Status post right craniotomy with tumor resection followed by [facility] to solitary brain metastasis under the care of Dr. Mitzi Hansen and Dr. Murrell Redden. 2) Concurrent chemoradiation with weekly carboplatin for AUC of 2 and paclitaxel 45 mg/M2.  First dose 02/01/2020. Status post 5 cycles.  Last dose was given February 29, 2020. 3) First-line treatment with immunotherapy with Libtayo (Cempilimab) 350 mg IV every 3 weeks. Last dose on 04/10/22.  Status post 35 cycles.     CURRENT THERAPY: Observation   INTERVAL HISTORY: Jared Shea. 67 y.o. male returns to the clinic today for a follow-up visit.  The patient recently completed 2 years of immunotherapy with Libtayo in November 2023 and tolerated well overall except for some joint pain (which has since improved). He has been on observation since that time and feeling fairly well.  Today, he denies any fever, chills, or night sweats.  His appetite is "***".  His occasional dyspnea is stable.  He sometimes may have an intermittent cough. He uses an OTC medication which is effective for him.  Denies any chest pain or hemoptysis. Denies any nausea, vomiting, diarrhea, or  constipation. Denies any rashes or skin changes.  Denies any headache or visual changes.  He saw his PCP recently for blood in the stool. He follows closely with neuro-oncology  and had a repeat MRI last week which did not show any disease progression in the brain. He recently had a restaging CT scan. He is here today for evaluation to review his scan results.    MEDICAL HISTORY: Past Medical History:  Diagnosis Date   Brain mass    Bursitis of right hip    Chest pain 08/12/2018   Chronic cough    Dependence on nicotine from cigarettes    Diabetes mellitus without complication (HCC)    Dysuria 04/03/2021   Essential hypertension 07/28/2018   Frequent headaches 06/27/2020   nscl ca dx'd 12/2019   Polyarthralgia 06/15/2020   Seizures (HCC)    Viral illness 03/23/2019    ALLERGIES:  is allergic to jardiance [empagliflozin] and penicillins.  MEDICATIONS:  Current Outpatient Medications  Medication Sig Dispense Refill   aspirin EC 81 MG tablet Take 1 tablet (81 mg total) by mouth daily. Swallow whole.     atorvastatin (LIPITOR) 20 MG tablet Take 1 tablet (20 mg total) by mouth daily. 90 tablet 3   Blood Glucose Monitoring Suppl (ACCU-CHEK GUIDE) w/Device KIT Use glucose meter to check your blood sugar. 1 kit 0   glucose blood test strip Use a new test strip each time to check your blood sugar. 100 each 12   hydrOXYzine (ATARAX) 10 MG tablet TAKE 1 TABLET(10 MG) BY MOUTH THREE TIMES DAILY AS NEEDED 30 tablet 0   isosorbide mononitrate (IMDUR) 30 MG  24 hr tablet Take 1 tablet (30 mg total) by mouth daily. 90 tablet 3   Lancets (ONETOUCH DELICA PLUS LANCET30G) MISC Use a new lancet each time to check your blood sugar. 100 each 11   levETIRAcetam (KEPPRA) 750 MG tablet Take 1 tablet (750 mg total) by mouth 2 (two) times daily. 180 tablet 4   losartan (COZAAR) 25 MG tablet Take 1 tablet (25 mg total) by mouth daily. 90 tablet 3   melatonin 5 MG TABS Take 1 tablet (5 mg total) by mouth at  bedtime as needed (for sleep). 30 tablet 0   metFORMIN (GLUCOPHAGE) 500 MG tablet Take 1 tablet (500 mg total) by mouth 2 (two) times daily with a meal. 120 tablet 2   methocarbamol (ROBAXIN) 500 MG tablet Take 500 mg by mouth every 8 (eight) hours as needed for muscle spasms.     metoprolol succinate (TOPROL XL) 25 MG 24 hr tablet Take 1 tablet (25 mg total) by mouth at bedtime. 90 tablet 3   No current facility-administered medications for this visit.    SURGICAL HISTORY:  Past Surgical History:  Procedure Laterality Date   APPLICATION OF CRANIAL NAVIGATION N/A 01/07/2020   Procedure: APPLICATION OF CRANIAL NAVIGATION;  Surgeon: Coletta Memos, MD;  Location: [facility] OR;  Service: Neurosurgery;  Laterality: N/A;   CRANIOTOMY Right 01/07/2020   Procedure: RIGHT CRANIOTOMY FOR TUMOR RESECTION;  Surgeon: Coletta Memos, MD;  Location: [facility] OR;  Service: Neurosurgery;  Laterality: Right;  rm 21   ENDOBRONCHIAL ULTRASOUND N/A 12/21/2019   Procedure: ENDOBRONCHIAL ULTRASOUND;  Surgeon: Tomma Lightning, MD;  Location: [facility] ENDOSCOPY;  Service: Pulmonary;  Laterality: N/A;   FINE NEEDLE ASPIRATION  12/21/2019   Procedure: FINE NEEDLE ASPIRATION (FNA) LINEAR;  Surgeon: Tomma Lightning, MD;  Location: [facility] ENDOSCOPY;  Service: Pulmonary;;   IR IMAGING GUIDED PORT INSERTION  03/03/2020   NO PAST SURGERIES     VIDEO BRONCHOSCOPY N/A 12/21/2019   Procedure: VIDEO BRONCHOSCOPY WITHOUT FLUORO;  Surgeon: Tomma Lightning, MD;  Location: [facility] ENDOS[facility];  Service: Pulmonary;  Laterality: N/A;    REVIEW OF SYSTEMS:   Review of Systems  Constitutional: Negative for appetite change, chills, fatigue, fever and unexpected weight change.  HENT:   Negative for mouth sores, nosebleeds, sore throat and trouble swallowing.   Eyes: Negative for eye problems and icterus.  Respiratory: Negative for cough, hemoptysis, shortness of breath and wheezing.   Cardiovascular: Negative for chest pain and leg swelling.  Gastrointestinal:  Negative for abdominal pain, constipation, diarrhea, nausea and vomiting.  Genitourinary: Negative for bladder incontinence, difficulty urinating, dysuria, frequency and hematuria.   Musculoskeletal: Negative for back pain, gait problem, neck pain and neck stiffness.  Skin: Negative for itching and rash.  Neurological: Negative for dizziness, extremity weakness, gait problem, headaches, light-headedness and seizures.  Hematological: Negative for adenopathy. Does not bruise/bleed easily.  Psychiatric/Behavioral: Negative for confusion, depression and sleep disturbance. The patient is not nervous/anxious.     PHYSICAL EXAMINATION:  There were no vitals taken for this visit.  ECOG PERFORMANCE STATUS: {CHL ONC ECOG [id number]  Physical Exam  Constitutional: Oriented to person, place, and time and well-developed, well-nourished, and in no distress. No distress.  HENT:  Head: Normocephalic and atraumatic.  Mouth/Throat: Oropharynx is clear and moist. No oropharyngeal exudate.  Eyes: Conjunctivae are normal. Right eye exhibits no discharge. Left eye exhibits no discharge. No scleral icterus.  Neck: Normal range of motion. Neck supple.  Cardiovascular: Normal rate, regular rhythm, normal heart  sounds and intact distal pulses.   Pulmonary/Chest: Effort normal and breath sounds normal. No respiratory distress. No wheezes. No rales.  Abdominal: Soft. Bowel sounds are normal. Exhibits no distension and no mass. There is no tenderness.  Musculoskeletal: Normal range of motion. Exhibits no edema.  Lymphadenopathy:    No cervical adenopathy.  Neurological: Alert and oriented to person, place, and time. Exhibits normal muscle tone. Gait normal. Coordination normal.  Skin: Skin is warm and dry. No rash noted. Not diaphoretic. No erythema. No pallor.  Psychiatric: Mood, memory and judgment normal.  Vitals reviewed.  LABORATORY DATA: Lab Results  Component Value Date   WBC 7.0 03/25/2023   HGB  12.2 (L) 03/25/2023   HCT 35.8 (L) 03/25/2023   MCV 86.9 03/25/2023   PLT 301 03/25/2023      Chemistry      Component Value Date/Time   NA 140 03/25/2023 1042   NA 140 12/25/2022 1028   K 3.9 03/25/2023 1042   CL 107 03/25/2023 1042   CO2 27 03/25/2023 1042   BUN 13 03/25/2023 1042   BUN 16 12/25/2022 1028   CREATININE 0.68 03/25/2023 1042      Component Value Date/Time   CALCIUM 9.5 03/25/2023 1042   ALKPHOS 61 03/25/2023 1042   AST 22 03/25/2023 1042   ALT 19 03/25/2023 1042   BILITOT 0.8 03/25/2023 1042       RADIOGRAPHIC STUDIES:  No results found.   ASSESSMENT/PLAN:  This is a very pleasant 67 year old African-American male diagnosed with stage IV (Tx, N2, M1c) non-small cell lung cancer, adenocarcinoma.  He presented with a solitary brain metastasis in addition to right hilar and mediastinal lymphadenopathy.  He was diagnosed in August 2021.     The patient is status post right craniotomy with resection of the solitary brain metastasis with SRS under the care of Dr. Mitzi Hansen and Dr. Franky Macho.    He completed weekly concurrent chemoradiation with carboplatin for an AUC of 2 and paclitaxel 45 mg per metered square.  He is status post 5 cycles.  He tolerated it well except for some dysphagia/odynophagia.   The patient then had evidence of new brain metastases.  He is status post SRS to the new lesions on 04/22/2020 under the care of Dr. Mitzi Hansen.   The patient recently completed immunotherapy with Libtayo IV every 3 weeks.  He is status post 35 cycles and tolerated it well except for arthralgias.  His last dose of treatment was on 04/10/2022.   The patient recently had a restaging CT scan performed.  Dr. Arbutus Ped personally independently reviewed the scan and discussed the results with the patient today.  The scan did not show*** any evidence of disease progression.   Dr. Arbutus Ped recommends the patient continue on observation with a restaging CT scan in 4 months.   We will  see him back for follow-up visit at that time to review his scan results.   I will arrange for his port-a-cath to be flushed in 6 weeks.    He will continue to follow with Dr. Barbaraann Cao regarding his history of metastatic disease to the brain. His next brain MRI is due in January 2024.    The patient was advised to call immediately if he has any concerning symptoms in the interval. The patient voices understanding of current disease status and treatment options and is in agreement with the current care plan. All questions were answered. The patient knows to call the clinic with any problems, questions or  concerns. We can certainly see the patient much sooner if necessary    No orders of the defined types were placed in this encounter.    I spent {CHL ONC TIME VISIT - QVZDG:[id number]} counseling the patient face to face. The total time spent in the appointment was {CHL ONC TIME VISIT - JJOAC:[id number]}.  Firmin Belisle L Jazmen Lindenbaum, PA-C 03/27/23

## 2023-04-01 ENCOUNTER — Telehealth: Payer: Self-pay | Admitting: Physician Assistant

## 2023-04-01 ENCOUNTER — Telehealth: Payer: Self-pay

## 2023-04-01 ENCOUNTER — Inpatient Hospital Stay: Payer: Medicare HMO | Admitting: Physician Assistant

## 2023-04-01 NOTE — Telephone Encounter (Signed)
The patient missed his appointment today. I attempted to call him but his voicemail was full. Unable to leave message.

## 2023-04-01 NOTE — Telephone Encounter (Signed)
Attempted to reach pt multiple times, but VM is full. Contacted pts sister, Cira Rue in regards to his appt today at 1030. Informed her that someone from the scheduling department will call him with another appt.  She asked if he could come to the cancer center or call to reschedule and I said yes, either is fine.  Pt's sister said she was going to try to contact him.  Pt's sister verbalized understanding.

## 2023-04-01 NOTE — Progress Notes (Unsigned)
[facility] OFFICE PROGRESS NOTE  Rana Snare, DO [address] [place] [address]  DIAGNOSIS: Stage IV (TX, N2, M1 C) non-small cell lung cancer, adenocarcinoma diagnosed in August 2021 and presented with solitary brain metastasis in addition to mediastinal lymphadenopathy.   PDL1 Expression 70%   Molecular Biomarkers:  Tumor Mutational Burden - 52 Muts/Mb Microsatellite status - MS-Stable Genomic Findings For a complete list of the genes assayed, please refer to the Appendix. NF1 E1694* MTAP loss exons 2-8 RICTOR amplification ATRX A419V BRAF K483E CDKN2A/B CDKN2A loss, CDKN2B loss DNMT3A E205* FGF10 amplification NTRK1 amplification - equivocal? 7 Disease relevant genes with no reportable alterations: ALK, EGFR, ERBB2, KRAS, MET, RET, ROS1  PRIOR THERAPY: 1) Status post right craniotomy with tumor resection followed by [facility] to solitary brain metastasis under the care of Dr. Mitzi Hansen and Dr. Murrell Redden. 2) Concurrent chemoradiation with weekly carboplatin for AUC of 2 and paclitaxel 45 mg/M2.  First dose 02/01/2020. Status post 5 cycles.  Last dose was given February 29, 2020. 3) First-line treatment with immunotherapy with Libtayo (Cempilimab) 350 mg IV every 3 weeks. Last dose on 04/10/22.  Status post 35 cycles.   CURRENT THERAPY: Observation   INTERVAL HISTORY: Jared Shea. 67 y.o. male returns to the clinic today for a follow-up visit.  The patient recently completed 2 years of immunotherapy with Libtayo in November 2023 and tolerated well overall except for some joint pain (which has since improved). He has been on observation since that time and feeling fairly well.  Today, he denies any fever, chills, or night sweats.  His appetite is "up and down". He did gain weight overall. His occasional dyspnea is stable. He states overall his breathing is "good". He sometimes may have an intermittent cough but not as much these days. It does not warrant taking a cough  medication.  Denies any chest pain or hemoptysis. Denies any nausea, vomiting, diarrhea, or constipation. Denies any rashes or skin changes.  Denies any visual changes. He had one mild self limiting headache about 1 month ago that resolved spontaneously and has not had any other headaches. He follows closely with neuro-oncology  and has a repeat brain MRI in January 2025. He recently had a restaging CT scan. He is here today for evaluation to review his scan results.    MEDICAL HISTORY: Past Medical History:  Diagnosis Date   Brain mass    Bursitis of right hip    Chest pain 08/12/2018   Chronic cough    Dependence on nicotine from cigarettes    Diabetes mellitus without complication (HCC)    Dysuria 04/03/2021   Essential hypertension 07/28/2018   Frequent headaches 06/27/2020   nscl ca dx'd 12/2019   Polyarthralgia 06/15/2020   Seizures (HCC)    Viral illness 03/23/2019    ALLERGIES:  is allergic to jardiance [empagliflozin] and penicillins.  MEDICATIONS:  Current Outpatient Medications  Medication Sig Dispense Refill   aspirin EC 81 MG tablet Take 1 tablet (81 mg total) by mouth daily. Swallow whole.     atorvastatin (LIPITOR) 20 MG tablet Take 1 tablet (20 mg total) by mouth daily. 90 tablet 3   Blood Glucose Monitoring Suppl (ACCU-CHEK GUIDE) w/Device KIT Use glucose meter to check your blood sugar. 1 kit 0   glucose blood test strip Use a new test strip each time to check your blood sugar. 100 each 12   hydrOXYzine (ATARAX) 10 MG tablet TAKE 1 TABLET(10 MG) BY MOUTH THREE  TIMES DAILY AS NEEDED 30 tablet 0   isosorbide mononitrate (IMDUR) 30 MG 24 hr tablet Take 1 tablet (30 mg total) by mouth daily. 90 tablet 3   Lancets (ONETOUCH DELICA PLUS LANCET30G) MISC Use a new lancet each time to check your blood sugar. 100 each 11   levETIRAcetam (KEPPRA) 750 MG tablet Take 1 tablet (750 mg total) by mouth 2 (two) times daily. 180 tablet 4   losartan (COZAAR) 25 MG tablet Take 1 tablet  (25 mg total) by mouth daily. 90 tablet 3   melatonin 5 MG TABS Take 1 tablet (5 mg total) by mouth at bedtime as needed (for sleep). 30 tablet 0   metFORMIN (GLUCOPHAGE) 500 MG tablet Take 1 tablet (500 mg total) by mouth 2 (two) times daily with a meal. 120 tablet 2   methocarbamol (ROBAXIN) 500 MG tablet Take 500 mg by mouth every 8 (eight) hours as needed for muscle spasms.     metoprolol succinate (TOPROL XL) 25 MG 24 hr tablet Take 1 tablet (25 mg total) by mouth at bedtime. 90 tablet 3   No current facility-administered medications for this visit.    SURGICAL HISTORY:  Past Surgical History:  Procedure Laterality Date   APPLICATION OF CRANIAL NAVIGATION N/A 01/07/2020   Procedure: APPLICATION OF CRANIAL NAVIGATION;  Surgeon: Coletta Memos, MD;  Location: [facility] OR;  Service: Neurosurgery;  Laterality: N/A;   CRANIOTOMY Right 01/07/2020   Procedure: RIGHT CRANIOTOMY FOR TUMOR RESECTION;  Surgeon: Coletta Memos, MD;  Location: [facility] OR;  Service: Neurosurgery;  Laterality: Right;  rm 21   ENDOBRONCHIAL ULTRASOUND N/A 12/21/2019   Procedure: ENDOBRONCHIAL ULTRASOUND;  Surgeon: Tomma Lightning, MD;  Location: [facility] ENDOSCOPY;  Service: Pulmonary;  Laterality: N/A;   FINE NEEDLE ASPIRATION  12/21/2019   Procedure: FINE NEEDLE ASPIRATION (FNA) LINEAR;  Surgeon: Tomma Lightning, MD;  Location: [facility] ENDOSCOPY;  Service: Pulmonary;;   IR IMAGING GUIDED PORT INSERTION  03/03/2020   NO PAST SURGERIES     VIDEO BRONCHOSCOPY N/A 12/21/2019   Procedure: VIDEO BRONCHOSCOPY WITHOUT FLUORO;  Surgeon: Tomma Lightning, MD;  Location: [facility] ENDOSCOPY;  Service: Pulmonary;  Laterality: N/A;    REVIEW OF SYSTEMS:   Review of Systems  Constitutional: Negative for appetite change, chills, fatigue, fever and unexpected weight change.  HENT:   Negative for mouth sores, nosebleeds, sore throat and trouble swallowing.   Eyes: Negative for eye problems and icterus.  Respiratory: Negative for cough, hemoptysis, shortness  of breath and wheezing.   Cardiovascular: Negative for chest pain and leg swelling.  Gastrointestinal: Negative for abdominal pain, constipation, diarrhea, nausea and vomiting.  Genitourinary: Negative for bladder incontinence, difficulty urinating, dysuria, frequency and hematuria.   Musculoskeletal: Negative for back pain, gait problem, neck pain and neck stiffness.  Skin: Negative for itching and rash.  Neurological: Negative for dizziness, extremity weakness, gait problem, headaches, light-headedness and seizures.  Hematological: Negative for adenopathy. Does not bruise/bleed easily.  Psychiatric/Behavioral: Negative for confusion, depression and sleep disturbance. The patient is not nervous/anxious.     PHYSICAL EXAMINATION:  There were no vitals taken for this visit.  ECOG PERFORMANCE STATUS: 1  Physical Exam  Constitutional: Oriented to person, place, and time and well-developed, well-nourished, and in no distress.  HENT:  Head: Normocephalic and atraumatic.  Mouth/Throat: Oropharynx is clear and moist. No oropharyngeal exudate.  Eyes: Conjunctivae are normal. Right eye exhibits no discharge. Left eye exhibits no discharge. No scleral icterus.  Neck: Normal range of motion. Neck  supple.  Cardiovascular: Normal rate, regular rhythm, normal heart sounds and intact distal pulses.   Pulmonary/Chest: Effort normal and breath sounds normal. No respiratory distress. No wheezes. No rales.  Abdominal: Soft. Bowel sounds are normal. Exhibits no distension and no mass. There is no tenderness.  Musculoskeletal: Normal range of motion. Exhibits no edema.  Lymphadenopathy:    No cervical adenopathy.  Neurological: Alert and oriented to person, place, and time. Exhibits normal muscle tone. Gait normal. Coordination normal.  Skin: Skin is warm and dry. No rash noted. Not diaphoretic. No erythema. No pallor.  Psychiatric: Mood, memory and judgment normal.  Vitals reviewed.  LABORATORY  DATA: Lab Results  Component Value Date   WBC 7.0 03/25/2023   HGB 12.2 (L) 03/25/2023   HCT 35.8 (L) 03/25/2023   MCV 86.9 03/25/2023   PLT 301 03/25/2023      Chemistry      Component Value Date/Time   NA 140 03/25/2023 1042   NA 140 12/25/2022 1028   K 3.9 03/25/2023 1042   CL 107 03/25/2023 1042   CO2 27 03/25/2023 1042   BUN 13 03/25/2023 1042   BUN 16 12/25/2022 1028   CREATININE 0.68 03/25/2023 1042      Component Value Date/Time   CALCIUM 9.5 03/25/2023 1042   ALKPHOS 61 03/25/2023 1042   AST 22 03/25/2023 1042   ALT 19 03/25/2023 1042   BILITOT 0.8 03/25/2023 1042       RADIOGRAPHIC STUDIES:  CT CHEST ABDOMEN PELVIS W CONTRAST  Result Date: 04/01/2023 CLINICAL DATA:  Lung cancer, chemotherapy, radiation and immunotherapy complete. * Tracking Code: BO * EXAM: CT CHEST, ABDOMEN, AND PELVIS WITH CONTRAST TECHNIQUE: Multidetector CT imaging of the chest, abdomen and pelvis was performed following the standard protocol during bolus administration of intravenous contrast. RADIATION DOSE REDUCTION: This exam was performed according to the departmental dose-optimization program which includes automated exposure control, adjustment of the mA and/or kV according to patient size and/or use of iterative reconstruction technique. CONTRAST:  OMNIPAQUE IOHEXOL 300 MG/ML  SOLN COMPARISON:  11/19/2022. FINDINGS: CT CHEST FINDINGS Cardiovascular: Right IJ Port-A-Cath terminates in the right atrium. Coronary artery calcification. Heart is at the upper limits of normal in size. No pericardial effusion. Mediastinum/Nodes: Thoracic inlet lymph nodes are not enlarged by CT size criteria. No pathologically enlarged mediastinal, hilar or axillary lymph nodes. Minimal soft tissue thickening in the left hilum, unchanged and presumably treatment related. Esophagus is grossly unremarkable. Lungs/Pleura: Centrilobular emphysema. Increasing subpleural ground-glass and slight consolidation in the  inferior posterior right lower lobe. Additional scattered patchy ground-glass scarring. No pleural fluid. Airway is unremarkable. Musculoskeletal: Degenerative changes in the spine. No worrisome lytic or sclerotic lesions. CT ABDOMEN PELVIS FINDINGS Hepatobiliary: Liver is slightly decreased in attenuation diffusely and contains a probable tiny cyst along the anterior periphery. Tiny gallstones. No biliary ductal dilatation. Pancreas: Negative. Spleen: Negative. Adrenals/Urinary Tract: Adrenal glands and right kidney are unremarkable. Subcentimeter low-attenuation lesion in the left kidney, too small to characterize. No specific follow-up necessary. Ureters are decompressed. Bladder is grossly unremarkable. Stomach/Bowel: Stomach, small bowel, appendix and colon are unremarkable. Vascular/Lymphatic: Atherosclerotic calcification of the aorta. Scattered abdominal peritoneal ligament and retroperitoneal lymph nodes measure up to 1.6 cm in the portacaval station, unchanged. Reproductive: Prostate is visualized. Other: No free fluid.  Mesenteries and peritoneum are unremarkable. Musculoskeletal: Degenerative changes in the spine. No worrisome lytic or sclerotic lesions. IMPRESSION: 1. No evidence of recurrent or metastatic disease. 2. Increasing ground-glass, septal thickening and slight  consolidation in the inferoposterior right lower lobe, likely inflammatory in etiology. The possibility of developing interstitial lung disease is not excluded. 3. Mild hepatic steatosis. 4. Cholelithiasis. 5. Aortic atherosclerosis (ICD10-I70.0). Coronary artery calcification. 6.  Emphysema (ICD10-J43.9). Electronically Signed   By: Leanna Battles M.D.   On: 04/01/2023 08:22     ASSESSMENT/PLAN:  This is a very pleasant 67 year old African-American male diagnosed with stage IV (Tx, N2, M1c) non-small cell lung cancer, adenocarcinoma.  He presented with a solitary brain metastasis in addition to right hilar and mediastinal  lymphadenopathy.  He was diagnosed in August 2021.     The patient is status post right craniotomy with resection of the solitary brain metastasis with SRS under the care of Dr. Mitzi Hansen and Dr. Franky Macho.    He completed weekly concurrent chemoradiation with carboplatin for an AUC of 2 and paclitaxel 45 mg per metered square.  He is status post 5 cycles.  He tolerated it well except for some dysphagia/odynophagia.   The patient then had evidence of new brain metastases.  He is status post SRS to the new lesions on 04/22/2020 under the care of Dr. Mitzi Hansen.   The patient recently completed immunotherapy with Libtayo IV every 3 weeks.  He is status post 35 cycles and tolerated it well except for arthralgias.  His last dose of treatment was on 04/10/2022.   The patient recently had a restaging CT scan performed.  Dr. Arbutus Ped personally independently reviewed the scan and discussed the results with the patient today.  The scan did not show any evidence of disease progression. It did show some increased ground glass changes which was felt to be inflammatory.    Dr. Arbutus Ped recommends the patient continue on observation with a restaging CT scan in 6 months.   We will see him back for follow-up visit at that time to review his scan results.   I will arrange for his port-a-cath to be flushed in 6 weeks.    He will continue to follow with Dr. Barbaraann Cao regarding his history of metastatic disease to the brain. His next brain MRI is due in January 2024.    The patient was advised to call immediately if he has any concerning symptoms in the interval. The patient voices understanding of current disease status and treatment options and is in agreement with the current care plan. All questions were answered. The patient knows to call the clinic with any problems, questions or concerns. We can certainly see the patient much sooner if necessary    No orders of the defined types were placed in this encounter.     Sondi Desch L Palmyra Rogacki, PA-C 04/01/23  ADDENDUM: Hematology/Oncology Attending: I had a face-to-face encounter with the patient today.  I reviewed his record, lab, scan and recommended his care plan.  This is a very pleasant 67 years old African-American male with stage IV non-small cell lung cancer, adenocarcinoma diagnosed in August 2021 with PD-L1 expression of 70%.  He is status post right craniotomy with tumor resection followed by SRS to the solitary brain metastasis.  The patient was also treated with a course of concurrent chemoradiation with weekly carboplatin and paclitaxel followed by first-line treatment with immunotherapy with Libtayo (Cempilimab) for 2 years completed in November 2023.  The patient has been on observation for the last year and he is doing fine with no concerning complaints. He had repeat CT scan of the chest performed recently.  I personally and independently reviewed the scan and discussed the  result with the patient today. His scan showed no concerning findings for disease progression. I recommended for him to continue on observation with repeat CT scan of the chest, abdomen and pelvis in 6 months. The patient was advised to call immediately if he has any other concerning symptoms in the interval. The total time spent in the appointment was 25 minutes. Disclaimer: This note was dictated with voice recognition software. Similar sounding words can inadvertently be transcribed and may be missed upon review. Lajuana Matte, MD

## 2023-04-01 NOTE — Telephone Encounter (Signed)
Pt came to cancer center to schedule new appt since he missed appt today.  Pt rescheduled for 11/26 and Dr. Arbutus Ped will not be in office to review scan.  Spoke with Pt's sister and moved appt to 11/21 @ 0930 when Dr. Arbutus Ped is here in office.

## 2023-04-02 ENCOUNTER — Encounter: Payer: Self-pay | Admitting: Dietician

## 2023-04-04 ENCOUNTER — Inpatient Hospital Stay: Payer: Medicare HMO | Admitting: Physician Assistant

## 2023-04-04 VITALS — BP 128/70 | HR 72 | Temp 98.0°F | Resp 17 | Wt 188.7 lb

## 2023-04-04 DIAGNOSIS — Z923 Personal history of irradiation: Secondary | ICD-10-CM | POA: Diagnosis not present

## 2023-04-04 DIAGNOSIS — C3491 Malignant neoplasm of unspecified part of right bronchus or lung: Secondary | ICD-10-CM

## 2023-04-04 DIAGNOSIS — C7931 Secondary malignant neoplasm of brain: Secondary | ICD-10-CM | POA: Diagnosis not present

## 2023-04-04 DIAGNOSIS — C3490 Malignant neoplasm of unspecified part of unspecified bronchus or lung: Secondary | ICD-10-CM | POA: Diagnosis not present

## 2023-04-04 DIAGNOSIS — Z9221 Personal history of antineoplastic chemotherapy: Secondary | ICD-10-CM | POA: Diagnosis not present

## 2023-04-04 DIAGNOSIS — Z87891 Personal history of nicotine dependence: Secondary | ICD-10-CM | POA: Diagnosis not present

## 2023-04-09 ENCOUNTER — Ambulatory Visit: Payer: Medicare HMO | Admitting: Physician Assistant

## 2023-04-15 ENCOUNTER — Telehealth: Payer: Self-pay | Admitting: *Deleted

## 2023-04-18 NOTE — Telephone Encounter (Signed)
Pt presented to front desk requesting refill on losartan  Per bottle 90 day supply filled on 02/19/23 with additional refills Per pharmacy, refill too soon and insurance will not pay. Pharmacy also stated that the same thing happened last month. CMA confirmed dose with patient and he verbalized correct dose of losartan 25 one tablet daily.   Has [facility] appt w/pcp 12/20224-pt instructed to bring all medication bottles.  Pharmacy will dispense enough meds until next refill and pt willing to pay out of pocket. No additional actions needed at this time, phone call complete.Kingsley Spittle Cassady12/5/20249:39 AM

## 2023-04-23 ENCOUNTER — Ambulatory Visit: Payer: Medicare HMO

## 2023-04-23 ENCOUNTER — Other Ambulatory Visit: Payer: Medicare HMO

## 2023-04-23 DIAGNOSIS — E1169 Type 2 diabetes mellitus with other specified complication: Secondary | ICD-10-CM

## 2023-04-23 DIAGNOSIS — E785 Hyperlipidemia, unspecified: Secondary | ICD-10-CM | POA: Diagnosis not present

## 2023-04-23 LAB — LIPID PANEL
Chol/HDL Ratio: 2.5 {ratio} (ref 0.0–5.0)
Cholesterol, Total: 84 mg/dL — ABNORMAL LOW (ref 100–199)
HDL: 33 mg/dL — ABNORMAL LOW (ref >39)
LDL Chol Calc (NIH): 29 mg/dL (ref 0–99)
Triglycerides: 118 mg/dL (ref 0–149)
VLDL Cholesterol Cal: 22 mg/dL (ref 5–40)

## 2023-04-24 ENCOUNTER — Ambulatory Visit: Payer: Medicare HMO

## 2023-04-24 VITALS — Ht 73.0 in | Wt 188.0 lb

## 2023-04-24 DIAGNOSIS — Z0000 Encounter for general adult medical examination without abnormal findings: Secondary | ICD-10-CM

## 2023-04-24 NOTE — Progress Notes (Signed)
Internal Medicine Clinic Attending  Case and documentation of Dr. Atway reviewed.  I reviewed the AWV findings.  I agree with the assessment, diagnosis, and plan of care documented in the AWV note.     

## 2023-04-24 NOTE — Progress Notes (Signed)
Subjective:   Jared Shea. is a 67 y.o. male who presents for Medicare Annual/Subsequent preventive examination.  Visit Complete: Virtual I connected with  [name]. on 04/24/23 by a audio enabled telemedicine application and verified that I am speaking with the correct person using two identifiers.  Patient Location: Home  Provider Location: Home Office  I discussed the limitations of evaluation and management by telemedicine. The patient expressed understanding and agreed to proceed.  Vital Signs: Because this visit was a virtual/telehealth visit, some criteria may be missing or patient reported. Any vitals not documented were not able to be obtained and vitals that have been documented are patient reported.  Cardiac Risk Factors include: advanced age (>33men, >30 women);male gender;hypertension;Other (see comment);diabetes mellitus, Risk factor comments: PVC, Cardiomyopathy, small cell carcinoma of lung, memory loss,     Objective:    Today's Vitals   04/24/23 0904  Weight: 188 lb (85.3 kg)  Height: 6\' 1"  (1.854 m)   Body mass index is 24.8 kg/m.     04/24/2023    9:16 AM 03/11/2023    9:30 AM 02/08/2023   10:19 AM 01/08/2023    9:16 AM 11/20/2022    8:43 AM 10/15/2022   11:16 AM 09/03/2022   11:17 AM  Advanced Directives  Does Patient Have a Medical Advance Directive? No No No Yes Yes Yes Yes  Type of Ecologist of [place];Living will Living will Living will  Does patient want to make changes to medical advance directive?      No - Patient declined   Copy of Healthcare Power of Attorney in Chart?     No - copy requested    Would patient like information on creating a medical advance directive?  No - Patient declined No - Patient declined  No - Patient declined No - Patient declined     Current Medications (verified) Outpatient Encounter Medications as of 04/24/2023  Medication Sig   aspirin EC 81 MG  tablet Take 1 tablet (81 mg total) by mouth daily. Swallow whole.   atorvastatin (LIPITOR) 20 MG tablet Take 1 tablet (20 mg total) by mouth daily.   Blood Glucose Monitoring Suppl (ACCU-CHEK GUIDE) w/Device KIT Use glucose meter to check your blood sugar.   glucose blood test strip Use a new test strip each time to check your blood sugar.   hydrOXYzine (ATARAX) 10 MG tablet TAKE 1 TABLET(10 MG) BY MOUTH THREE TIMES DAILY AS NEEDED   isosorbide mononitrate (IMDUR) 30 MG 24 hr tablet Take 1 tablet (30 mg total) by mouth daily.   Lancets (ONETOUCH DELICA PLUS LANCET30G) MISC Use a new lancet each time to check your blood sugar.   levETIRAcetam (KEPPRA) 750 MG tablet Take 1 tablet (750 mg total) by mouth 2 (two) times daily.   losartan (COZAAR) 25 MG tablet Take 1 tablet (25 mg total) by mouth daily.   melatonin 5 MG TABS Take 1 tablet (5 mg total) by mouth at bedtime as needed (for sleep).   metFORMIN (GLUCOPHAGE) 500 MG tablet Take 1 tablet (500 mg total) by mouth 2 (two) times daily with a meal.   methocarbamol (ROBAXIN) 500 MG tablet Take 500 mg by mouth every 8 (eight) hours as needed for muscle spasms.   metoprolol succinate (TOPROL XL) 25 MG 24 hr tablet Take 1 tablet (25 mg total) by mouth at bedtime.   No facility-administered encounter medications on file as of 04/24/2023.  Allergies (verified) Jardiance [empagliflozin] and Penicillins   History: Past Medical History:  Diagnosis Date   Brain mass    Bursitis of right hip    Chest pain 08/12/2018   Chronic cough    Dependence on nicotine from cigarettes    Diabetes mellitus without complication (HCC)    Dysuria 04/03/2021   Essential hypertension 07/28/2018   Frequent headaches 06/27/2020   nscl ca dx'd 12/2019   Polyarthralgia 06/15/2020   Seizures (HCC)    Viral illness 03/23/2019   Past Surgical History:  Procedure Laterality Date   APPLICATION OF CRANIAL NAVIGATION N/A 01/07/2020   Procedure: APPLICATION OF CRANIAL  NAVIGATION;  Surgeon: Coletta Memos, MD;  Location: [facility] OR;  Service: Neurosurgery;  Laterality: N/A;   CRANIOTOMY Right 01/07/2020   Procedure: RIGHT CRANIOTOMY FOR TUMOR RESECTION;  Surgeon: Coletta Memos, MD;  Location: [facility] OR;  Service: Neurosurgery;  Laterality: Right;  rm 21   ENDOBRONCHIAL ULTRASOUND N/A 12/21/2019   Procedure: ENDOBRONCHIAL ULTRASOUND;  Surgeon: Tomma Lightning, MD;  Location: [facility];  Service: Pulmonary;  Laterality: N/A;   FINE NEEDLE ASPIRATION  12/21/2019   Procedure: FINE NEEDLE ASPIRATION (FNA) LINEAR;  Surgeon: Tomma Lightning, MD;  Location: [facility];  Service: Pulmonary;;   IR IMAGING GUIDED PORT INSERTION  03/03/2020   NO PAST SURGERIES     VIDEO BRONCHOSCOPY N/A 12/21/2019   Procedure: VIDEO BRONCHOSCOPY WITHOUT FLUORO;  Surgeon: Tomma Lightning, MD;  Location: [facility];  Service: Pulmonary;  Laterality: N/A;   Family History  Problem Relation Age of Onset   Heart disease Mother        CABG   Kidney disease Sister    Diabetes Brother    Kidney disease Brother        KIDNEY TRANSPLANT   Hypertension Sister    Healthy Daughter    Healthy Daughter    Healthy Son    Social History   Socioeconomic History   Marital status: Divorced    Spouse name: Not on file   Number of children: 3   Years of education: Not on file   Highest education level: Not on file  Occupational History   Occupation: Retired  Tobacco Use   Smoking status: Former    Current packs/day: 0.00    Average packs/day: 1 pack/day for 49.4 years (49.4 ttl pk-yrs)    Types: Cigarettes    Start date: 08/07/1970    Quit date: 12/15/2019    Years since quitting: 3.3   Smokeless tobacco: Never  Vaping Use   Vaping status: Never Used  Substance and Sexual Activity   Alcohol use: Not Currently   Drug use: Never   Sexual activity: Not on file    Comment: YES  Other Topics Concern   Not on file  Social History Narrative   Lives with son   Social Determinants of  Health   Financial Resource Strain: Medium Risk (04/24/2023)   Overall Financial Resource Strain (CARDIA)    Difficulty of Paying Living Expenses: Somewhat hard  Food Insecurity: Food Insecurity Present (04/24/2023)   Hunger Vital Sign    Worried About Running Out of Food in the Last Year: Sometimes true    Ran Out of Food in the Last Year: Sometimes true  Transportation Needs: No Transportation Needs (04/24/2023)   PRAPARE - Administrator, Civil Service (Medical): No    Lack of Transportation (Non-Medical): No  Physical Activity: Sufficiently Active (04/24/2023)   Exercise Vital Sign  Days of Exercise per Week: 3 days    Minutes of Exercise per Session: 60 min  Stress: No Stress Concern Present (04/24/2023)   [employer] of Occupational Health - Occupational Stress Questionnaire    Feeling of Stress : Not at all  Social Connections: Moderately Isolated (04/24/2023)   Social Connection and Isolation Panel [NHANES]    Frequency of Communication with Friends and Family: Three times a week    Frequency of Social Gatherings with Friends and Family: Twice a week    Attends Religious Services: More than 4 times per year    Active Member of [employer] or Organizations: No    Attends Engineer, structural: Never    Marital Status: Divorced    Tobacco Counseling Counseling given: Not Answered   Clinical Intake:  Pre-visit preparation completed: Yes  Pain : No/denies pain     BMI - recorded: 24.8 Nutritional Status: BMI of 19-24  Normal Nutritional Risks: Nausea/ vomitting/ diarrhea (vomiting, due to food, last week) Diabetes: Yes Did pt. bring in CBG monitor from home?: No  How often do you need to have someone help you when you read instructions, pamphlets, or other written materials from your doctor or pharmacy?: 4 - Often  Interpreter Needed?: No  Information entered by :: Waylen Depaolo, RMA   Activities of Daily Living    04/24/2023    9:05  AM 03/11/2023    9:30 AM  In your present state of health, do you have any difficulty performing the following activities:  Hearing? 0 0  Vision? 0 0  Difficulty concentrating or making decisions? 0 1  Comment  AT TIMES  Walking or climbing stairs? 0 1  Comment  AT TIMES  Dressing or bathing? 0 0  Doing errands, shopping? 0 0  Preparing Food and eating ? N   Using the Toilet? N   In the past six months, have you accidently leaked urine? N   Do you have problems with loss of bowel control? N   Managing your Medications? N   Managing your Finances? N   Housekeeping or managing your Housekeeping? N     Patient Care Team: Rana Snare, DO as PCP - General Orbie Pyo, MD as PCP - Cardiology (Cardiology)  Indicate any recent Medical Services you may have received from other than [facility] providers in the past year (date may be approximate).     Assessment:   This is a routine wellness examination for [facility].  Hearing/Vision screen Hearing Screening - Comments:: Denies hearing difficulties   Vision Screening - Comments:: Wears eyeglasses   Goals Addressed               This Visit's Progress     Patient Stated (pt-stated)        Stay healthy      Depression Screen    04/24/2023    9:24 AM 02/08/2023   10:25 AM 01/08/2023    9:14 AM 11/20/2022    8:43 AM 10/15/2022   11:14 AM 09/03/2022   11:16 AM 08/20/2022    9:24 AM  PHQ 2/9 Scores  PHQ - 2 Score 1 0 0 0 0 0 1  PHQ- 9 Score 2   0 5  5    Fall Risk    04/24/2023    9:16 AM 03/11/2023    9:29 AM 02/08/2023   10:25 AM 01/08/2023    9:14 AM 11/20/2022    8:43 AM  Fall Risk   Falls  in the past year? 0 0 0 0 0  Number falls in past yr: 0  0 0 0  Injury with Fall? 0  0 0 0  Risk for fall due to : No Fall Risks No Fall Risks No Fall Risks No Fall Risks No Fall Risks  Follow up Falls evaluation completed;Falls prevention discussed Falls evaluation completed Falls evaluation completed Falls prevention discussed;Falls  evaluation completed Falls evaluation completed    MEDICARE RISK AT HOME: Medicare Risk at Home Any stairs in or around the home?: No Home free of loose throw rugs in walkways, pet beds, electrical cords, etc?: Yes Adequate lighting in your home to reduce risk of falls?: Yes Life alert?: No Use of a cane, walker or w/c?: No Grab bars in the bathroom?: No Shower chair or bench in shower?: No Elevated toilet seat or a handicapped toilet?: No  TIMED UP AND GO:  Was the test performed?  No    Cognitive Function:      03/14/2021   11:00 AM  Montreal Cognitive Assessment   Visuospatial/ Executive (0/5) 2  Naming (0/3) 3  Attention: Read list of digits (0/2) 2  Attention: Read list of letters (0/1) 1  Attention: Serial 7 subtraction starting at 100 (0/3) 0  Language: Repeat phrase (0/2) 1  Language : Fluency (0/1) 0  Abstraction (0/2) 1  Delayed Recall (0/5) 0  Orientation (0/6) 3  Total 13      04/24/2023    9:17 AM 03/16/2022    9:45 AM  6CIT Screen  What Year? 0 points 0 points  What month? 0 points 0 points  What time? 0 points 0 points  Count back from 20 0 points 0 points  Months in reverse 0 points 0 points  Repeat phrase 0 points 0 points  Total Score 0 points 0 points    Immunizations Immunization History  Administered Date(s) Administered   Fluad Quad(high Dose 65+) 05/22/2022   Fluad Trivalent(High Dose 65+) 02/08/2023   PFIZER(Purple Top)SARS-COV-2 Vaccination 08/10/2019, 09/10/2019   PNEUMOCOCCAL CONJUGATE-20 07/11/2021    TDAP status: Due, Education has been provided regarding the importance of this vaccine. Advised may receive this vaccine at local pharmacy or Health Dept. Aware to provide a copy of the vaccination record if obtained from local pharmacy or Health Dept. Verbalized acceptance and understanding.  Flu Vaccine status: Up to date  Pneumococcal vaccine status: Up to date  Covid-19 vaccine status: Information provided on how to obtain  vaccines.   Qualifies for Shingles Vaccine? Yes   Zostavax completed No   Shingrix Completed?: No.    Education has been provided regarding the importance of this vaccine. Patient has been advised to call insurance company to determine out of pocket expense if they have not yet received this vaccine. Advised may also receive vaccine at local pharmacy or Health Dept. Verbalized acceptance and understanding.  Screening Tests Health Maintenance  Topic Date Due   Hepatitis C Screening  05/03/2023 (Originally 01/06/1974)   DTaP/Tdap/Td (1 - Tdap) 07/12/2023 (Originally 01/05/1975)   COVID-19 Vaccine (3 - Pfizer risk series) 08/12/2023 (Originally 10/08/2019)   Zoster Vaccines- Shingrix (1 of 2) 09/11/2023 (Originally 01/05/1975)   HEMOGLOBIN A1C  08/08/2023   Diabetic kidney evaluation - Urine ACR  02/08/2024   OPHTHALMOLOGY EXAM  03/12/2024   Diabetic kidney evaluation - eGFR measurement  03/24/2024   Medicare Annual Wellness (AWV)  04/23/2024   Pneumonia Vaccine 4+ Years old  Completed   INFLUENZA VACCINE  Completed  HPV VACCINES  Aged Out   FOOT EXAM  Discontinued   Colonoscopy  Discontinued    Health Maintenance  There are no preventive care reminders to display for this patient.   Colorectal cancer screening: Type of screening: Colonoscopy. Completed 06/2022. Repeat every 1 years  Lung Cancer Screening: (Low Dose CT Chest recommended if Age 81-80 years, 20 pack-year currently smoking OR have quit w/in 15years.) does not qualify.   Lung Cancer Screening Referral: N/A  Additional Screening:  Hepatitis C Screening: does not qualify; Completed N/A  Vision Screening: Recommended annual ophthalmology exams for early detection of glaucoma and other disorders of the eye. Is the patient up to date with their annual eye exam?  No  Who is the provider or what is the name of the office in which the patient attends annual eye exams? Need a referral If pt is not established with a  provider, would they like to be referred to a provider to establish care? Yes .   Dental Screening: Recommended annual dental exams for proper oral hygiene  Diabetic Foot Exam: Diabetic Foot Exam: Overdue, Pt has been advised about the importance in completing this exam. Pt is scheduled for diabetic foot exam on 05/03/2023.  Community Resource Referral / Chronic Care Management: CRR required this visit?  Yes   CCM required this visit?  No     Plan:     I have personally reviewed and noted the following in the patient's chart:   Medical and social history Use of alcohol, tobacco or illicit drugs  Current medications and supplements including opioid prescriptions. Patient is not currently taking opioid prescriptions. Functional ability and status Nutritional status Physical activity Advanced directives List of other physicians Hospitalizations, surgeries, and ER visits in previous 12 months Vitals Screenings to include cognitive, depression, and falls Referrals and appointments  In addition, I have reviewed and discussed with patient certain preventive protocols, quality metrics, and best practice recommendations. A written personalized care plan for preventive services as well as general preventive health recommendations were provided to patient.     Liliyana Thobe L Zailee Vallely, CMA   04/24/2023   After Visit Summary: (MyChart) Due to this being a telephonic visit, the after visit summary with patients personalized plan was offered to patient via MyChart   Nurse Notes: Patient is due for a a Shingles vaccine, a Tetanus vaccine and a Covid vaccine.  Patient has an appointment coming up next week and would like to get his Tdap there in office.  He is aware that he can get all vaccines needed at his pharmacy.  Patient is also due for a eye exam, which he would like a referral for one during his up coming office visit next week.  Patient is due for a Hep C screening as well and asking if doctor  can use his blood/lab that was drawn yesterday.  He had no other concerns to address today.

## 2023-04-24 NOTE — Patient Instructions (Signed)
Mr. Roddey , Thank you for taking time to come for your Medicare Wellness Visit. I appreciate your ongoing commitment to your health goals. Please review the following plan we discussed and let me know if I can assist you in the future.   Referrals/Orders/Follow-Ups/Clinician Recommendations: You are due for a Tetanus vaccine, a Shingles vaccine and a Covid vaccine.  You can get these done at your pharmacy.  Remember to ask about a referral for an eye doctor during your next office visit.  It was nice talking to you today and keep up the good work.  This is a list of the screening recommended for you and due dates:  Health Maintenance  Topic Date Due   Hepatitis C Screening  05/03/2023*   DTaP/Tdap/Td vaccine (1 - Tdap) 07/12/2023*   COVID-19 Vaccine (3 - Pfizer risk series) 08/12/2023*   Zoster (Shingles) Vaccine (1 of 2) 09/11/2023*   Hemoglobin A1C  08/08/2023   Yearly kidney health urinalysis for diabetes  02/08/2024   Eye exam for diabetics  03/12/2024   Yearly kidney function blood test for diabetes  03/24/2024   Medicare Annual Wellness Visit  04/23/2024   Pneumonia Vaccine  Completed   Flu Shot  Completed   HPV Vaccine  Aged Out   Complete foot exam   Discontinued   Colon Cancer Screening  Discontinued  *Topic was postponed. The date shown is not the original due date.    Advanced directives: (Declined) Advance directive discussed with you today. Even though you declined this today, please call our office should you change your mind, and we can give you the proper paperwork for you to fill out.  Next Medicare Annual Wellness Visit scheduled for next year: Yes

## 2023-05-01 ENCOUNTER — Telehealth: Payer: Self-pay | Admitting: Pharmacist

## 2023-05-01 NOTE — Telephone Encounter (Signed)
Patient made aware of labs result. Continue atorvastatin 20mg  daily.

## 2023-05-03 ENCOUNTER — Encounter: Payer: Self-pay | Admitting: Student

## 2023-05-03 ENCOUNTER — Ambulatory Visit: Payer: Medicare HMO | Admitting: Student

## 2023-05-03 VITALS — BP 128/64 | HR 75 | Temp 97.7°F | Ht 73.0 in | Wt 190.8 lb

## 2023-05-03 DIAGNOSIS — Z7984 Long term (current) use of oral hypoglycemic drugs: Secondary | ICD-10-CM

## 2023-05-03 DIAGNOSIS — M546 Pain in thoracic spine: Secondary | ICD-10-CM | POA: Insufficient documentation

## 2023-05-03 DIAGNOSIS — I10 Essential (primary) hypertension: Secondary | ICD-10-CM | POA: Diagnosis not present

## 2023-05-03 DIAGNOSIS — Z0000 Encounter for general adult medical examination without abnormal findings: Secondary | ICD-10-CM

## 2023-05-03 DIAGNOSIS — E119 Type 2 diabetes mellitus without complications: Secondary | ICD-10-CM

## 2023-05-03 MED ORDER — KETOROLAC TROMETHAMINE 30 MG/ML IJ SOLN
30.0000 mg | Freq: Once | INTRAMUSCULAR | Status: AC
  Administered 2023-05-03: 30 mg via INTRAMUSCULAR

## 2023-05-03 MED ORDER — METHOCARBAMOL 750 MG PO TABS: 750.0000 mg | ORAL_TABLET | Freq: Three times a day (TID) | ORAL | 0 refills | Status: DC | PRN

## 2023-05-03 NOTE — Assessment & Plan Note (Signed)
Reports 3 days of upper left side back pain that started after work.  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  Pain is located in the upper thoracic paraspinal region. Denies any chest pain, shortness of breath or associated symptoms with his back pain.  Back pain does not radiate further down or cause any radiculopathic pain.  Has tried Tylenol as needed with some relief. Exam today showed tenderness to palpation of left thoracic paraspinal muscles, pain is reproducible, no midline tenderness.  Suspect likely MSK injury/strain leading to pain.   Plan -IM Toradol shot given in office today -Robaxin 750 mg 3 times daily as needed x 7 days -Tylenol PRN for pain  -Reassess at next visit, work note provided today, return precautions given

## 2023-05-03 NOTE — Assessment & Plan Note (Signed)
Has follow-up scheduled with oncology next month.

## 2023-05-03 NOTE — Progress Notes (Signed)
CC: Left upper back pain  HPI:  Jared Shea. is a 67 y.o. male living with a history stated below and presents today for left upper back pain. Please see problem based assessment and plan for additional details.  Past Medical History:  Diagnosis Date   Brain mass    Bursitis of right hip    Chest pain 08/12/2018   Chronic cough    Dependence on nicotine from cigarettes    Diabetes mellitus without complication (HCC)    Dysuria 04/03/2021   Essential hypertension 07/28/2018   Frequent headaches 06/27/2020   nscl ca dx'd 12/2019   Polyarthralgia 06/15/2020   Seizures (HCC)    Viral illness 03/23/2019    Current Outpatient Medications on File Prior to Visit  Medication Sig Dispense Refill   aspirin EC 81 MG tablet Take 1 tablet (81 mg total) by mouth daily. Swallow whole.     atorvastatin (LIPITOR) 20 MG tablet Take 1 tablet (20 mg total) by mouth daily. 90 tablet 3   Blood Glucose Monitoring Suppl (ACCU-CHEK GUIDE) w/Device KIT Use glucose meter to check your blood sugar. 1 kit 0   glucose blood test strip Use a new test strip each time to check your blood sugar. 100 each 12   hydrOXYzine (ATARAX) 10 MG tablet TAKE 1 TABLET(10 MG) BY MOUTH THREE TIMES DAILY AS NEEDED 30 tablet 0   isosorbide mononitrate (IMDUR) 30 MG 24 hr tablet Take 1 tablet (30 mg total) by mouth daily. 90 tablet 3   Lancets (ONETOUCH DELICA PLUS LANCET30G) MISC Use a new lancet each time to check your blood sugar. 100 each 11   levETIRAcetam (KEPPRA) 750 MG tablet Take 1 tablet (750 mg total) by mouth 2 (two) times daily. 180 tablet 4   losartan (COZAAR) 25 MG tablet Take 1 tablet (25 mg total) by mouth daily. 90 tablet 3   melatonin 5 MG TABS Take 1 tablet (5 mg total) by mouth at bedtime as needed (for sleep). 30 tablet 0   metFORMIN (GLUCOPHAGE) 500 MG tablet Take 1 tablet (500 mg total) by mouth 2 (two) times daily with a meal. 120 tablet 2   metoprolol succinate (TOPROL XL) 25 MG 24 hr tablet Take  1 tablet (25 mg total) by mouth at bedtime. 90 tablet 3   No current facility-administered medications on file prior to visit.    Family History  Problem Relation Age of Onset   Heart disease Mother        CABG   Kidney disease Sister    Diabetes Brother    Kidney disease Brother        KIDNEY TRANSPLANT   Hypertension Sister    Healthy Daughter    Healthy Daughter    Healthy Son     Social History   Socioeconomic History   Marital status: Divorced    Spouse name: Not on file   Number of children: 3   Years of education: Not on file   Highest education level: Not on file  Occupational History   Occupation: Retired  Tobacco Use   Smoking status: Former    Current packs/day: 0.00    Average packs/day: 1 pack/day for 49.4 years (49.4 ttl pk-yrs)    Types: Cigarettes    Start date: 08/07/1970    Quit date: 12/15/2019    Years since quitting: 3.3   Smokeless tobacco: Never  Vaping Use   Vaping status: Never Used  Substance and Sexual Activity   Alcohol  use: Not Currently   Drug use: Never   Sexual activity: Not on file    Comment: YES  Other Topics Concern   Not on file  Social History Narrative   Lives with son   Social Drivers of Health   Financial Resource Strain: Medium Risk (04/24/2023)   Overall Financial Resource Strain (CARDIA)    Difficulty of Paying Living Expenses: Somewhat hard  Food Insecurity: Food Insecurity Present (04/24/2023)   Hunger Vital Sign    Worried About Running Out of Food in the Last Year: Sometimes true    Ran Out of Food in the Last Year: Sometimes true  Transportation Needs: No Transportation Needs (04/24/2023)   PRAPARE - Administrator, Civil Service (Medical): No    Lack of Transportation (Non-Medical): No  Physical Activity: Sufficiently Active (04/24/2023)   Exercise Vital Sign    Days of Exercise per Week: 3 days    Minutes of Exercise per Session: 60 min  Stress: No Stress Concern Present (04/24/2023)    [employer] of Occupational Health - Occupational Stress Questionnaire    Feeling of Stress : Not at all  Social Connections: Moderately Isolated (04/24/2023)   Social Connection and Isolation Panel [NHANES]    Frequency of Communication with Friends and Family: Three times a week    Frequency of Social Gatherings with Friends and Family: Twice a week    Attends Religious Services: More than 4 times per year    Active Member of [employer] or Organizations: No    Attends Banker Meetings: Never    Marital Status: Divorced  Catering manager Violence: Patient Unable To Answer (04/24/2023)   Humiliation, Afraid, Rape, and Kick questionnaire    Fear of Current or Ex-Partner: Patient unable to answer    Emotionally Abused: Patient unable to answer    Physically Abused: Patient unable to answer    Sexually Abused: Patient unable to answer    Review of Systems: ROS negative except for what is noted on the assessment and plan.  Vitals:   05/03/23 0920  BP: 128/64  Pulse: 75  Temp: 97.7 F (36.5 C)  TempSrc: Oral  SpO2: 98%  Weight: 190 lb 12.8 oz (86.5 kg)  Height: 6\' 1"  (1.854 m)   Physical Exam: Constitutional: well-appearing male sitting in chair uncomfortably, in no acute distress Neck: supple Cardiovascular: regular rate  Pulmonary/Chest: normal work of breathing on room air, lungs clear to auscultation bilaterally MSK: TTP of left upper thoracic paraspinal region, no midline tenderness, pain is reproducible Neurological: alert & oriented x 3  Assessment & Plan:   Essential hypertension Stable.  BP controlled at 128/64 with metoprolol succinate 25 mg daily, losartan 25 mg, Imdur 30 mg.  Tolerating well and no acute concerns.  No changes today.  Last BMP in 03/2023 was unremarkable with normal electrolytes and kidney function.  Plan -Continue metoprolol, losartan, Imdur  Non-small cell carcinoma of lung, stage 4 w/ mets to brain [facility]) Has follow-up scheduled  with oncology next month.  Diabetes mellitus (HCC) Stable, controlled. Last A1c 5.8 in September.  Currently taking metformin 500 mg twice daily.  Urine ACR normal.  No acute concerns at this time.  Had ophthalmology visit on 10/30.  Plan -Continue metformin 500 mg twice daily -Continue atorvastatin 20 mg daily -Recheck A1c in 3 months (~q6 months)  Acute left-sided thoracic back pain Reports 3 days of upper left side back pain that started after work.  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  Pain is located in the upper thoracic paraspinal region. Denies any chest pain, shortness of breath or associated symptoms with his back pain.  Back pain does not radiate further down or cause any radiculopathic pain.  Has tried Tylenol as needed with some relief. Exam today showed tenderness to palpation of left thoracic paraspinal muscles, pain is reproducible, no midline tenderness.  Suspect likely MSK injury/strain leading to pain.   Plan -IM Toradol shot given in office today -Robaxin 750 mg 3 times daily as needed x 7 days -Tylenol PRN for pain  -Reassess at next visit, work note provided today, return precautions given  Health care maintenance -Declined hepatitis C screening today due to acute pain -Declined Tdap vaccination today due to acute pain   Patient discussed with Dr. Docia Furl, D.O. [facility], PGY-2 Phone: [phone] Date 05/03/2023 Time 2:06 PM

## 2023-05-03 NOTE — Patient Instructions (Addendum)
Thank you, Mr.Claire Ulis Rias. for allowing [place] to provide your care today. Today we discussed diabetes, medication refills, and left back pain.   -Plan to recheck A1c for diabetes in Feb/March. Continue taking the metformin.  -All your medications have refills at the pharmacy for you to pick up.  -For your back pain, sounds likely muscle pain, will send to pharmacy Robaxin 750 mg (muscle relaxer) that you can take up to three times a day as needed  -Robaxin can cause drowsiness for some people so can start taking at night.  -You received a Toradol shot for the pain today.   -Call office if pain worsens or you develop other symptoms with it.   I have ordered the following medication/changed the following medications:   Stop the following medications: Medications Discontinued During This Encounter  Medication Reason   methocarbamol (ROBAXIN) 500 MG tablet      Start the following medications: Meds ordered this encounter  Medications   methocarbamol (ROBAXIN-750) 750 MG tablet    Sig: Take 1 tablet (750 mg total) by mouth every 8 (eight) hours as needed for up to 7 days for muscle spasms.    Dispense:  21 tablet    Refill:  0   ketorolac (TORADOL) 30 MG/ML injection 30 mg     Follow up:  2-3 months    Remember: Should you have any questions or concerns please call the internal medicine clinic at [phone].    Rana Snare, D.O. [facility]

## 2023-05-03 NOTE — Assessment & Plan Note (Signed)
-  Declined hepatitis C screening today due to acute pain -Declined Tdap vaccination today due to acute pain

## 2023-05-03 NOTE — Assessment & Plan Note (Signed)
Stable, controlled. Last A1c 5.8 in September.  Currently taking metformin 500 mg twice daily.  Urine ACR normal.  No acute concerns at this time.  Had ophthalmology visit on 10/30.  Plan -Continue metformin 500 mg twice daily -Continue atorvastatin 20 mg daily -Recheck A1c in 3 months (~q6 months)

## 2023-05-03 NOTE — Assessment & Plan Note (Signed)
Stable.  BP controlled at 128/64 with metoprolol succinate 25 mg daily, losartan 25 mg, Imdur 30 mg.  Tolerating well and no acute concerns.  No changes today.  Last BMP in 03/2023 was unremarkable with normal electrolytes and kidney function.  Plan -Continue metoprolol, losartan, Imdur

## 2023-05-03 NOTE — Assessment & Plan Note (Signed)
>>  ASSESSMENT AND PLAN FOR UNCONTROLLED TYPE 2 DIABETES MELLITUS WITH HYPERGLYCEMIA, WITHOUT LONG-TERM CURRENT USE OF INSULIN  (HCC) WRITTEN ON 05/03/2023 11:58 AM BY Malvina Schadler, DO  Stable, controlled. Last A1c 5.8 in September.  Currently taking metformin  500 mg twice daily.  Urine ACR normal.  No acute concerns at this time.  Had ophthalmology visit on 10/30.  Plan -Continue metformin  500 mg twice daily -Continue atorvastatin  20 mg daily -Recheck A1c in 3 months (~q6 months)

## 2023-05-05 ENCOUNTER — Other Ambulatory Visit: Payer: Self-pay | Admitting: Internal Medicine

## 2023-05-05 DIAGNOSIS — G40209 Localization-related (focal) (partial) symptomatic epilepsy and epileptic syndromes with complex partial seizures, not intractable, without status epilepticus: Secondary | ICD-10-CM

## 2023-05-06 ENCOUNTER — Encounter: Payer: Self-pay | Admitting: Internal Medicine

## 2023-05-06 ENCOUNTER — Telehealth: Payer: Self-pay

## 2023-05-06 NOTE — Telephone Encounter (Signed)
Pt states he's still having the same pain when he came to his appt on 05/03/2023. States he did take muscle relaxer and tylenol, but it did not helps with pain. Wanting to know what should he do now for his pain. Please call pt back.

## 2023-05-06 NOTE — Progress Notes (Signed)
Internal Medicine Clinic Attending  Case discussed with the resident at the time of the visit.  We reviewed the resident's history and exam and pertinent patient test results.  I agree with the assessment, diagnosis, and plan of care documented in the resident's note.  

## 2023-05-06 NOTE — Telephone Encounter (Signed)
Return pt's call - no answer. 

## 2023-05-13 ENCOUNTER — Other Ambulatory Visit: Payer: Self-pay | Admitting: Student

## 2023-05-16 ENCOUNTER — Inpatient Hospital Stay: Payer: Medicare HMO

## 2023-05-16 ENCOUNTER — Inpatient Hospital Stay: Payer: Medicare HMO | Attending: Internal Medicine

## 2023-05-16 ENCOUNTER — Ambulatory Visit (HOSPITAL_COMMUNITY)
Admission: RE | Admit: 2023-05-16 | Discharge: 2023-05-16 | Disposition: A | Discharge status: Home / Self Care | Payer: Medicare HMO | Source: Ambulatory Visit | Attending: Internal Medicine | Admitting: Internal Medicine

## 2023-05-16 DIAGNOSIS — Z880 Allergy status to penicillin: Secondary | ICD-10-CM | POA: Insufficient documentation

## 2023-05-16 DIAGNOSIS — Z87891 Personal history of nicotine dependence: Secondary | ICD-10-CM | POA: Insufficient documentation

## 2023-05-16 DIAGNOSIS — Z7982 Long term (current) use of aspirin: Secondary | ICD-10-CM | POA: Diagnosis not present

## 2023-05-16 DIAGNOSIS — I6782 Cerebral ischemia: Secondary | ICD-10-CM | POA: Insufficient documentation

## 2023-05-16 DIAGNOSIS — R253 Fasciculation: Secondary | ICD-10-CM | POA: Insufficient documentation

## 2023-05-16 DIAGNOSIS — Z95828 Presence of other vascular implants and grafts: Secondary | ICD-10-CM

## 2023-05-16 DIAGNOSIS — E119 Type 2 diabetes mellitus without complications: Secondary | ICD-10-CM | POA: Insufficient documentation

## 2023-05-16 DIAGNOSIS — Z8249 Family history of ischemic heart disease and other diseases of the circulatory system: Secondary | ICD-10-CM | POA: Insufficient documentation

## 2023-05-16 DIAGNOSIS — C7931 Secondary malignant neoplasm of brain: Secondary | ICD-10-CM | POA: Insufficient documentation

## 2023-05-16 DIAGNOSIS — I10 Essential (primary) hypertension: Secondary | ICD-10-CM | POA: Insufficient documentation

## 2023-05-16 DIAGNOSIS — C3491 Malignant neoplasm of unspecified part of right bronchus or lung: Secondary | ICD-10-CM | POA: Insufficient documentation

## 2023-05-16 DIAGNOSIS — Z5986 Financial insecurity: Secondary | ICD-10-CM | POA: Insufficient documentation

## 2023-05-16 DIAGNOSIS — R9089 Other abnormal findings on diagnostic imaging of central nervous system: Secondary | ICD-10-CM | POA: Diagnosis not present

## 2023-05-16 DIAGNOSIS — Z833 Family history of diabetes mellitus: Secondary | ICD-10-CM | POA: Insufficient documentation

## 2023-05-16 DIAGNOSIS — Z79899 Other long term (current) drug therapy: Secondary | ICD-10-CM | POA: Insufficient documentation

## 2023-05-16 DIAGNOSIS — G9389 Other specified disorders of brain: Secondary | ICD-10-CM | POA: Diagnosis not present

## 2023-05-16 LAB — CMP (CANCER CENTER ONLY)
ALT: 17 U/L (ref 0–44)
AST: 19 U/L (ref 15–41)
Albumin: 4.4 g/dL (ref 3.5–5.0)
Alkaline Phosphatase: 71 U/L (ref 38–126)
Anion gap: 5 (ref 5–15)
BUN: 13 mg/dL (ref 8–23)
CO2: 26 mmol/L (ref 22–32)
Calcium: 9.2 mg/dL (ref 8.9–10.3)
Chloride: 108 mmol/L (ref 98–111)
Creatinine: 0.72 mg/dL (ref 0.61–1.24)
GFR, Estimated: >60 mL/min (ref >60)
Glucose, Bld: 210 mg/dL — ABNORMAL HIGH (ref 70–99)
Potassium: 3.9 mmol/L (ref 3.5–5.1)
Sodium: 139 mmol/L (ref 135–145)
Total Bilirubin: 0.6 mg/dL (ref 0.0–1.2)
Total Protein: 7.6 g/dL (ref 6.5–8.1)

## 2023-05-16 LAB — CBC WITH DIFFERENTIAL (CANCER CENTER ONLY)
Abs Immature Granulocytes: 0.02 10*3/uL (ref 0.00–0.07)
Basophils Absolute: 0 10*3/uL (ref 0.0–0.1)
Basophils Relative: 0 %
Eosinophils Absolute: 0.1 10*3/uL (ref 0.0–0.5)
Eosinophils Relative: 1 %
HCT: 34.1 % — ABNORMAL LOW (ref 39.0–52.0)
Hemoglobin: 11.7 g/dL — ABNORMAL LOW (ref 13.0–17.0)
Immature Granulocytes: 0 %
Lymphocytes Relative: 18 %
Lymphs Abs: 1.2 10*3/uL (ref 0.7–4.0)
MCH: 29.5 pg (ref 26.0–34.0)
MCHC: 34.3 g/dL (ref 30.0–36.0)
MCV: 85.9 fL (ref 80.0–100.0)
Monocytes Absolute: 0.5 10*3/uL (ref 0.1–1.0)
Monocytes Relative: 7 %
Neutro Abs: 5.1 10*3/uL (ref 1.7–7.7)
Neutrophils Relative %: 74 %
Platelet Count: 390 10*3/uL (ref 150–400)
RBC: 3.97 MIL/uL — ABNORMAL LOW (ref 4.22–5.81)
RDW: 14.7 % (ref 11.5–15.5)
WBC Count: 7 10*3/uL (ref 4.0–10.5)
nRBC: 0 % (ref 0.0–0.2)

## 2023-05-16 MED ORDER — HEPARIN SOD (PORK) LOCK FLUSH 100 UNIT/ML IV SOLN
500.0000 [IU] | INTRAVENOUS | Status: AC | PRN
  Administered 2023-05-16: 500 [IU]
  Filled 2023-05-16: qty 5

## 2023-05-16 MED ORDER — GADOBUTROL 1 MMOL/ML IV SOLN
9.0000 mL | Freq: Once | INTRAVENOUS | Status: AC | PRN
  Administered 2023-05-16: 9 mL via INTRAVENOUS

## 2023-05-16 MED ORDER — SODIUM CHLORIDE 0.9% FLUSH
10.0000 mL | Freq: Once | INTRAVENOUS | Status: AC
  Administered 2023-05-16: 10 mL

## 2023-05-20 ENCOUNTER — Inpatient Hospital Stay: Payer: Medicare HMO

## 2023-05-23 ENCOUNTER — Inpatient Hospital Stay (HOSPITAL_BASED_OUTPATIENT_CLINIC_OR_DEPARTMENT_OTHER): Payer: Medicare HMO | Admitting: Internal Medicine

## 2023-05-23 VITALS — BP 148/71 | HR 70 | Temp 97.9°F | Resp 18 | Ht 73.0 in | Wt 186.4 lb

## 2023-05-23 DIAGNOSIS — R253 Fasciculation: Secondary | ICD-10-CM | POA: Diagnosis not present

## 2023-05-23 DIAGNOSIS — C7931 Secondary malignant neoplasm of brain: Secondary | ICD-10-CM | POA: Diagnosis not present

## 2023-05-23 DIAGNOSIS — Z79899 Other long term (current) drug therapy: Secondary | ICD-10-CM | POA: Diagnosis not present

## 2023-05-23 DIAGNOSIS — Z7982 Long term (current) use of aspirin: Secondary | ICD-10-CM | POA: Diagnosis not present

## 2023-05-23 DIAGNOSIS — C3491 Malignant neoplasm of unspecified part of right bronchus or lung: Secondary | ICD-10-CM | POA: Diagnosis not present

## 2023-05-23 DIAGNOSIS — Z5986 Financial insecurity: Secondary | ICD-10-CM | POA: Diagnosis not present

## 2023-05-23 DIAGNOSIS — G40209 Localization-related (focal) (partial) symptomatic epilepsy and epileptic syndromes with complex partial seizures, not intractable, without status epilepticus: Secondary | ICD-10-CM | POA: Diagnosis not present

## 2023-05-23 DIAGNOSIS — I10 Essential (primary) hypertension: Secondary | ICD-10-CM | POA: Diagnosis not present

## 2023-05-23 DIAGNOSIS — I6782 Cerebral ischemia: Secondary | ICD-10-CM | POA: Diagnosis not present

## 2023-05-23 DIAGNOSIS — E119 Type 2 diabetes mellitus without complications: Secondary | ICD-10-CM | POA: Diagnosis not present

## 2023-05-23 NOTE — Progress Notes (Signed)
 [facility] at [facility] [address]  [place], [place] [address] [phone]  Interval Evaluation  Date of Service: 05/23/23 Patient Name: Jared Barrell. Patient MRN: [id number] Patient DOB: 1956-02-27 Provider: Arthea MARLA Manns, MD  Identifying Statement:  Jared Hemann. is a 68 y.o. male with Malignant neoplasm metastatic to brain [facility]) [C79.31]   Primary Cancer:  Oncologic History: Oncology History  Non-small cell carcinoma of lung, stage 4 w/ mets to brain (HCC)  12/31/2019 Initial Diagnosis   Non-small cell carcinoma of lung, stage 4 w/ mets to brain (HCC)   06/06/2020 Cancer Staging   Staging form: Lung, AJCC 8th Edition - Clinical: Stage IVB (cT0, cN2, cM1c) - Signed by Sherrod Sherrod, MD on 06/06/2020   Adenocarcinoma, metastatic (HCC)  01/07/2020 Initial Diagnosis   Adenocarcinoma, metastatic (HCC)   02/01/2020 - 03/07/2020 Chemotherapy   The patient had dexamethasone  (DECADRON ) 4 MG tablet, 8 mg, Oral, Daily, 1 of 1 cycle, Start date: --, End date: -- palonosetron  (ALOXI ) injection 0.25 mg, 0.25 mg, Intravenous,  Once, 5 of 7 cycles Administration: 0.25 mg (02/01/2020), 0.25 mg (02/08/2020), 0.25 mg (03/07/2020), 0.25 mg (02/15/2020), 0.25 mg (02/22/2020) CARBOplatin  (PARAPLATIN ) 220 mg in sodium chloride  0.9 % 250 mL chemo infusion, 220 mg (100 % of original dose 216.8 mg), Intravenous,  Once, 5 of 7 cycles Dose modification: 216.8 mg (original dose 216.8 mg, Cycle 1) Administration: 220 mg (02/01/2020), 220 mg (02/08/2020), 220 mg (03/07/2020), 220 mg (02/15/2020), 220 mg (02/22/2020) PACLitaxel  (TAXOL ) 90 mg in sodium chloride  0.9 % 250 mL chemo infusion (</= 80mg /m2), 45 mg/m2 = 90 mg, Intravenous,  Once, 5 of 7 cycles Administration: 90 mg (02/01/2020), 90 mg (02/08/2020), 90 mg (03/07/2020), 90 mg (02/15/2020), 90 mg (02/22/2020)  for chemotherapy treatment.    04/25/2020 - 04/10/2022 Chemotherapy   Patient is on Treatment Plan : LUNG NSCLC  Cemiplimab  q21d      CNS Oncologic History 01/05/20: Pre-op SRS [facility]) 01/07/20: Craniotomy, R frontal resection ([facility]) 04/22/20: Salvage SRS to 3 targets   Interval History:  Jared Alms. presents today for follow up after brain MRI. No new or progressive deficits described today.  Does have ongoing issues with his short term memory, stable from prior.  No changes in his gait.  Now on observation with Dr. Sherrod.    H+P (04/18/20) Patient presents following recent brain MRI.  He had completed pre-op SRS and underwent craniotomy for R frontal metastasis, after initial presenting with focal seizures (facial twitching).  He denies focal neurologic complaints today, he is functional and independent.  He is undergoing chemotherapy with Dr. Sherrod (completed carbo and taxol ) without complication.  His gait is unassisted.  Denies any seizures following his initial presentation, compliant with Keppra .      Medications: Current Outpatient Medications on File Prior to Visit  Medication Sig Dispense Refill   aspirin  EC 81 MG tablet Take 1 tablet (81 mg total) by mouth daily. Swallow whole.     atorvastatin  (LIPITOR) 20 MG tablet Take 1 tablet (20 mg total) by mouth daily. 90 tablet 3   Blood Glucose Monitoring Suppl (ACCU-CHEK GUIDE) w/Device KIT Use glucose meter to check your blood sugar. 1 kit 0   glucose blood test strip Use a new test strip each time to check your blood sugar. 100 each 12   hydrOXYzine  (ATARAX ) 10 MG tablet TAKE 1 TABLET(10 MG) BY MOUTH THREE TIMES DAILY AS NEEDED 30 tablet 0   isosorbide  mononitrate (IMDUR ) 30  MG 24 hr tablet Take 1 tablet (30 mg total) by mouth daily. 90 tablet 3   Lancets (ONETOUCH DELICA PLUS LANCET30G) MISC Use a new lancet each time to check your blood sugar. 100 each 11   levETIRAcetam  (KEPPRA ) 750 MG tablet TAKE 1 TABLET(750 MG) BY MOUTH TWICE DAILY 180 tablet 4   losartan  (COZAAR ) 25 MG tablet Take 1 tablet (25 mg total) by mouth daily. 90 tablet  3   melatonin 5 MG TABS Take 1 tablet (5 mg total) by mouth at bedtime as needed (for sleep). 30 tablet 0   metFORMIN  (GLUCOPHAGE ) 500 MG tablet Take 1 tablet (500 mg total) by mouth 2 (two) times daily with a meal. 120 tablet 2   methocarbamol  (ROBAXIN ) 750 MG tablet TAKE 1 TABLET(750 MG) BY MOUTH EVERY 8 HOURS FOR UP TO 7 DAYS AS NEEDED FOR MUSCLE SPASMS 21 tablet 0   metoprolol  succinate (TOPROL  XL) 25 MG 24 hr tablet Take 1 tablet (25 mg total) by mouth at bedtime. 90 tablet 3   No current facility-administered medications on file prior to visit.    Allergies:  Allergies  Allergen Reactions   Jardiance  [Empagliflozin ]     Caused diarrhea & itching, so we stopped   Penicillins Rash    Did it involve swelling of the face/tongue/throat, SOB, or low BP? N Did it involve sudden or severe rash/hives, skin peeling, or any reaction on the inside of your mouth or nose? Y Did you need to seek medical attention at a hospital or doctor's office? Y When did it last happen?    childhood   If all above answers are "NO", may proceed with cephalosporin use.    Past Medical History:  Past Medical History:  Diagnosis Date   Brain mass    Bursitis of right hip    Chest pain 08/12/2018   Chronic cough    Dependence on nicotine  from cigarettes    Diabetes mellitus without complication (HCC)    Dysuria 04/03/2021   Essential hypertension 07/28/2018   Frequent headaches 06/27/2020   nscl ca dx'd 12/2019   Polyarthralgia 06/15/2020   Seizures (HCC)    Viral illness 03/23/2019   Past Surgical History:  Past Surgical History:  Procedure Laterality Date   APPLICATION OF CRANIAL NAVIGATION N/A 01/07/2020   Procedure: APPLICATION OF CRANIAL NAVIGATION;  Surgeon: Gillie Duncans, MD;  Location: [facility] OR;  Service: Neurosurgery;  Laterality: N/A;   CRANIOTOMY Right 01/07/2020   Procedure: RIGHT CRANIOTOMY FOR TUMOR RESECTION;  Surgeon: Gillie Duncans, MD;  Location: [facility] OR;  Service: Neurosurgery;   Laterality: Right;  rm 21   ENDOBRONCHIAL ULTRASOUND N/A 12/21/2019   Procedure: ENDOBRONCHIAL ULTRASOUND;  Surgeon: Neda Jennet LABOR, MD;  Location: [facility] ENDOSCOPY;  Service: Pulmonary;  Laterality: N/A;   FINE NEEDLE ASPIRATION  12/21/2019   Procedure: FINE NEEDLE ASPIRATION (FNA) LINEAR;  Surgeon: Neda Jennet LABOR, MD;  Location: [facility] ENDOSCOPY;  Service: Pulmonary;;   IR IMAGING GUIDED PORT INSERTION  03/03/2020   NO PAST SURGERIES     VIDEO BRONCHOSCOPY N/A 12/21/2019   Procedure: VIDEO BRONCHOSCOPY WITHOUT FLUORO;  Surgeon: Neda Jennet LABOR, MD;  Location: [facility] ENDOSCOPY;  Service: Pulmonary;  Laterality: N/A;   Social History:  Social History   Socioeconomic History   Marital status: Divorced    Spouse name: Not on file   Number of children: 3   Years of education: Not on file   Highest education level: Not on file  Occupational History   Occupation:  Retired  Tobacco Use   Smoking status: Former    Current packs/day: 0.00    Average packs/day: 1 pack/day for 49.4 years (49.4 ttl pk-yrs)    Types: Cigarettes    Start date: 08/07/1970    Quit date: 12/15/2019    Years since quitting: 3.4   Smokeless tobacco: Never  Vaping Use   Vaping status: Never Used  Substance and Sexual Activity   Alcohol use: Not Currently   Drug use: Never   Sexual activity: Not on file    Comment: YES  Other Topics Concern   Not on file  Social History Narrative   Lives with son   Social Drivers of Health   Financial Resource Strain: Medium Risk (04/24/2023)   Overall Financial Resource Strain (CARDIA)    Difficulty of Paying Living Expenses: Somewhat hard  Food Insecurity: Food Insecurity Present (04/24/2023)   Hunger Vital Sign    Worried About Running Out of Food in the Last Year: Sometimes true    Ran Out of Food in the Last Year: Sometimes true  Transportation Needs: No Transportation Needs (04/24/2023)   PRAPARE - Administrator, Civil Service (Medical): No    Lack of  Transportation (Non-Medical): No  Physical Activity: Sufficiently Active (04/24/2023)   Exercise Vital Sign    Days of Exercise per Week: 3 days    Minutes of Exercise per Session: 60 min  Stress: No Stress Concern Present (04/24/2023)   [employer] of Occupational Health - Occupational Stress Questionnaire    Feeling of Stress : Not at all  Social Connections: Moderately Isolated (04/24/2023)   Social Connection and Isolation Panel [NHANES]    Frequency of Communication with Friends and Family: Three times a week    Frequency of Social Gatherings with Friends and Family: Twice a week    Attends Religious Services: More than 4 times per year    Active Member of [employer] or Organizations: No    Attends Banker Meetings: Never    Marital Status: Divorced  Catering Manager Violence: Patient Unable To Answer (04/24/2023)   Humiliation, Afraid, Rape, and Kick questionnaire    Fear of Current or Ex-Partner: Patient unable to answer    Emotionally Abused: Patient unable to answer    Physically Abused: Patient unable to answer    Sexually Abused: Patient unable to answer   Family History:  Family History  Problem Relation Age of Onset   Heart disease Mother        CABG   Kidney disease Sister    Diabetes Brother    Kidney disease Brother        KIDNEY TRANSPLANT   Hypertension Sister    Healthy Daughter    Healthy Daughter    Healthy Son     Review of Systems: Constitutional: Doesn't report fevers, chills or abnormal weight loss Eyes: Doesn't report blurriness of vision Ears, nose, mouth, throat, and face: Doesn't report sore throat Respiratory: Doesn't report cough, dyspnea or wheezes Cardiovascular: Doesn't report palpitation, chest discomfort  Gastrointestinal:  Doesn't report nausea, constipation, diarrhea GU: Doesn't report incontinence Skin: Doesn't report skin rashes Neurological: Per HPI Musculoskeletal: diffuse joint pain Behavioral/Psych: Doesn't  report anxiety  Physical Exam: Wt Readings from Last 3 Encounters:  05/23/23 186 lb 7 oz (84.6 kg)  05/03/23 190 lb 12.8 oz (86.5 kg)  04/24/23 188 lb (85.3 kg)   Temp Readings from Last 3 Encounters:  05/23/23 97.9 F (36.6 C) (Oral)  05/03/23 97.7 F (  36.5 C) (Oral)  04/04/23 98 F (36.7 C) (Temporal)   BP Readings from Last 3 Encounters:  05/23/23 (!) 148/71  05/03/23 128/64  04/04/23 128/70   Pulse Readings from Last 3 Encounters:  05/23/23 70  05/03/23 75  04/04/23 72    KPS: 80. General: Alert, cooperative, pleasant, in no acute distress Head: Normal EENT: No conjunctival injection or scleral icterus.  Lungs: Resp effort normal Cardiac: Regular rate Abdomen: Non-distended abdomen Skin: No rashes cyanosis or petechiae. Extremities: No clubbing or edema  Neurologic Exam: Mental Status: Awake, alert, attentive to examiner. Oriented to self and environment. Language is fluent with intact comprehension.  Cranial Nerves: Visual acuity is grossly normal. Visual fields are full. Extra-ocular movements intact. No ptosis. Face is symmetric Motor: Tone and bulk are normal. Power is full in both arms and legs. Reflexes are symmetric, no pathologic reflexes present.  Sensory: Intact to light touch Gait: Normal.   Labs: I have reviewed the data as listed    Component Value Date/Time   NA 139 05/16/2023 1028   NA 140 12/25/2022 1028   K 3.9 05/16/2023 1028   CL 108 05/16/2023 1028   CO2 26 05/16/2023 1028   GLUCOSE 210 (H) 05/16/2023 1028   BUN 13 05/16/2023 1028   BUN 16 12/25/2022 1028   CREATININE 0.72 05/16/2023 1028   CALCIUM  9.2 05/16/2023 1028   PROT 7.6 05/16/2023 1028   PROT 8.3 02/01/2023 0952   ALBUMIN 4.4 05/16/2023 1028   ALBUMIN 4.9 02/01/2023 0952   AST 19 05/16/2023 1028   ALT 17 05/16/2023 1028   ALKPHOS 71 05/16/2023 1028   BILITOT 0.6 05/16/2023 1028   GFRNONAA >60 05/16/2023 1028   GFRAA >60 02/15/2020 1000   Lab Results  Component  Value Date   WBC 7.0 05/16/2023   NEUTROABS 5.1 05/16/2023   HGB 11.7 (L) 05/16/2023   HCT 34.1 (L) 05/16/2023   MCV 85.9 05/16/2023   PLT 390 05/16/2023    Imaging:  [facility] Clinician Interpretation: I have personally reviewed the CNS images as listed.  My interpretation, in the context of the patient's clinical presentation, is stable disease   MR BRAIN W WO CONTRAST Result Date: 05/16/2023 CLINICAL DATA:  Brain/CNS neoplasm.  Assess treatment response. EXAM: MRI HEAD WITHOUT AND WITH CONTRAST TECHNIQUE: Multiplanar, multiecho pulse sequences of the brain and surrounding structures were obtained without and with intravenous contrast. CONTRAST:  9mL GADAVIST  GADOBUTROL  1 MMOL/ML IV SOLN COMPARISON:  11/17/2022 FINDINGS: Brain: Diffusion imaging does not show any acute or subacute infarction or other cause of restricted diffusion. As seen previously, there has been previous right posterior frontal craniotomy with underlying brain resection or biopsy. Focal gliosis and volume loss present in that region with mild chronic hemosiderin deposition consistent with the previous biopsy. No abnormal enhancement, increasing mass effect or other worrisome finding. No evidence of residual or recurrent tumor. Elsewhere, the brain shows mild chronic small-vessel ischemic change of the white matter, unchanged since the prior exam. No large vessel stroke. No new mass, hydrocephalus or extra-axial collection. Vascular: Major vessels at the base of the brain show flow. Skull and upper cervical spine: Otherwise negative Sinuses/Orbits: Clear/normal Other: None IMPRESSION: 1. Stable appearance of the brain since 11/17/2022. Previous right posterior frontal craniotomy with underlying brain resection or biopsy. No evidence of residual or recurrent tumor. No new lesion. 2. Mild chronic small-vessel ischemic change of the white matter, unchanged. Electronically Signed   By: Oneil Officer M.D.   On: 05/16/2023  14:17      Assessment/Plan Malignant neoplasm metastatic to brain [facility]) [C79.31]  Jared Robynn Raddle. is clinically and radiographically stable today. No clinical changes related to CNS burden of disease.  May con't Keppra  750mg  BID for seizure prevention.  He has transitioned to observation for NSCLC with Dr. Sherrod.  We appreciate the opportunity to participate in the care of Jared Ducharme..    We ask that Jared Robynn Raddle. return to clinic in 9 months following next brain MRI, or sooner as needed.  All questions were answered. The patient knows to call the clinic with any problems, questions or concerns. No barriers to learning were detected.  The total time spent in the encounter was 30 minutes and more than 50% was on counseling and review of test results   Arthea MARLA Manns, MD Medical Director of Neuro-Oncology [facility] 05/23/23 9:31 AM

## 2023-05-27 ENCOUNTER — Other Ambulatory Visit: Payer: Self-pay | Admitting: Radiation Therapy

## 2023-06-04 ENCOUNTER — Other Ambulatory Visit: Payer: Self-pay | Admitting: *Deleted

## 2023-06-04 ENCOUNTER — Other Ambulatory Visit: Payer: Self-pay | Admitting: Internal Medicine

## 2023-06-04 ENCOUNTER — Other Ambulatory Visit: Payer: Self-pay | Admitting: Student

## 2023-06-04 ENCOUNTER — Telehealth: Payer: Self-pay | Admitting: *Deleted

## 2023-06-04 DIAGNOSIS — I10 Essential (primary) hypertension: Secondary | ICD-10-CM

## 2023-06-04 DIAGNOSIS — I429 Cardiomyopathy, unspecified: Secondary | ICD-10-CM

## 2023-06-04 MED ORDER — ISOSORBIDE MONONITRATE ER 30 MG PO TB24: 30.0000 mg | ORAL_TABLET | Freq: Every day | ORAL | 3 refills | Status: DC

## 2023-06-04 NOTE — Progress Notes (Unsigned)
Patient here requesting refill on Methocarbamol.  Reports is still having pain.  Call to Pharmacy states that refill is to early.  Patient's last pick up was on 04/14/2023.  Patient states did pick up but is currently out of medications. Patient has been taking 2 pills per day.  1 in the morning and 1 at night.  Took 1 pill of each this morning.

## 2023-06-04 NOTE — Telephone Encounter (Signed)
Patient here stating unable to get refills on his Isosorbide or Atorvastatin.  Call to Pharmacy patient last filled on 04/14/2023 for 90 tablets.   Patient was asked how often he was taking medication.  States was taking both 1 in the morning and 1 at night.  When asked why he stated he has been doing for quite some time and he feels ok.  B/P was done 122/73 P-83.  Information was given to Dr. Mayford Knife.  Patient to stop the Atorvastatin as his Cholesterol level is low.  Methocarbamol prescription was not refilled as it was for a temporary dosing for pain.  Patient states not having pain.  Further discussion with patient will stop his  Atorvastatin  and only do the Isosorbide 1 time a day.  Has a follow up appointment in March .  Further discussion and follow up will be done then.

## 2023-06-05 ENCOUNTER — Telehealth: Payer: Self-pay | Admitting: *Deleted

## 2023-06-05 ENCOUNTER — Encounter: Payer: Self-pay | Admitting: Internal Medicine

## 2023-06-05 NOTE — Telephone Encounter (Signed)
Call from pt about refilling Imdur. Venita Sheffield had talked to pt yesterday about this medication. Pt stated the pharmacy will not refill his med; it was last refilled on 12/1 for 90 tabs. I told pt it's too early unless he pay out of pocket. He stated he paid out of pocket for his last bottle.(B/c he was taking med BID) I asked pt how often is he taking this med , he stated 1 in am and 1 at night - I told pt he should be taking it only once a day per chart. He stated his BP is doing better with twice a day.I asked pt did he tell the doctor he saw in December, he stated "No". Pt does not have any med at this time; completely out; I told pt to call the pharmacy to see if they would give him enough med until his appt on Friday. I told pt needs to discuss this with the doctor; whether to keep him on once a day or change to BID. He agreed to schedule an appt - first available Friday @0915  Am

## 2023-06-06 ENCOUNTER — Encounter: Payer: Self-pay | Admitting: Internal Medicine

## 2023-06-06 ENCOUNTER — Other Ambulatory Visit: Payer: Self-pay

## 2023-06-06 ENCOUNTER — Ambulatory Visit: Payer: Medicare HMO | Admitting: Internal Medicine

## 2023-06-06 VITALS — BP 127/72 | HR 81 | Ht 73.0 in | Wt 187.2 lb

## 2023-06-06 DIAGNOSIS — I10 Essential (primary) hypertension: Secondary | ICD-10-CM

## 2023-06-06 MED ORDER — ISOSORBIDE MONONITRATE ER 60 MG PO TB24: 60.0000 mg | ORAL_TABLET | Freq: Every day | ORAL | 3 refills | Status: DC

## 2023-06-06 MED ORDER — METOPROLOL SUCCINATE ER 25 MG PO TB24: 25.0000 mg | ORAL_TABLET | Freq: Every evening | ORAL | 3 refills | Status: DC

## 2023-06-06 NOTE — Assessment & Plan Note (Signed)
Home medications include losartan 25 mg qd, metoprolol succinate 25 mg qd, IMDUR 30 mg every day.  He endorses taking Imdur 30 mg twice daily.  He is not able to recall if he is taking losartan or metoprolol succinate.  Blood pressure reading at 127/72 today.  He denies dizziness or side effects from current medications.  He tried to call his son to confirm his home medications during visit but was unable to reach him. P: Imdur 60 mg daily sent to pharmacy I will call patient and attempt to confirm that he is taking losartan and metoprolol succinate this afternoon.  If so then I would like him to continue this medication

## 2023-06-06 NOTE — Patient Instructions (Signed)
Thank you, [name] for allowing [place] to provide your care today.   Hypertension I sent in Imdur 60 mg.  Take this medication once daily.  I will call you this afternoon to confirm that you are taking losartan and metoprolol.  If you have been taking these 2 medications please continue them as your blood pressure looks well-controlled today.  I have ordered the following medication/changed the following medications:   Stop the following medications: Medications Discontinued During This Encounter  Medication Reason   isosorbide mononitrate (IMDUR) 30 MG 24 hr tablet      Start the following medications: Meds ordered this encounter  Medications   isosorbide mononitrate (IMDUR) 60 MG 24 hr tablet    Sig: Take 1 tablet (60 mg total) by mouth daily.    Dispense:  90 tablet    Refill:  3     Follow up: 3 months    We look forward to seeing you next time. Please call our clinic at [phone] if you have any questions or concerns. The best time to call is Monday-Friday from 9am-4pm, but there is someone available 24/7. If after hours or the weekend, call the main hospital number and ask for the Internal Medicine Resident On-Call. If you need medication refills, please notify your pharmacy one week in advance and they will send [place] a request.   Thank you for trusting me with your care. Wishing you the best!   Rudene Christians, DO [facility]

## 2023-06-06 NOTE — Progress Notes (Signed)
Subjective:  CC: HTN  HPI:  Jared Shea. is a 68 y.o. male with a past medical history of metastatic lung cancer to brain s/p craniectomy, HTN, diabetes who presents for follow-up on HTN and HLD.  He was taking imdur BID rather than every day and pharmacy would not refill medication as he needed to fill this early.   Please see problem based assessment and plan for additional details.  Past Medical History:  Diagnosis Date   Brain mass    Bursitis of right hip    Chest pain 08/12/2018   Chronic cough    Dependence on nicotine from cigarettes    Diabetes mellitus without complication (HCC)    Dysuria 04/03/2021   Essential hypertension 07/28/2018   Frequent headaches 06/27/2020   Metastatic lung cancer (metastasis from lung to other site) [facility])    nscl ca dx'd 12/2019   Polyarthralgia 06/15/2020   Seizures (HCC)    Viral illness 03/23/2019    Current Outpatient Medications on File Prior to Visit  Medication Sig Dispense Refill   aspirin EC 81 MG tablet Take 1 tablet (81 mg total) by mouth daily. Swallow whole.     Blood Glucose Monitoring Suppl (ACCU-CHEK GUIDE) w/Device KIT Use glucose meter to check your blood sugar. 1 kit 0   glucose blood test strip Use a new test strip each time to check your blood sugar. 100 each 12   hydrOXYzine (ATARAX) 10 MG tablet TAKE 1 TABLET(10 MG) BY MOUTH THREE TIMES DAILY AS NEEDED 30 tablet 0   Lancets (ONETOUCH DELICA PLUS LANCET30G) MISC Use a new lancet each time to check your blood sugar. 100 each 11   levETIRAcetam (KEPPRA) 750 MG tablet TAKE 1 TABLET(750 MG) BY MOUTH TWICE DAILY 180 tablet 4   losartan (COZAAR) 25 MG tablet Take 1 tablet (25 mg total) by mouth daily. 90 tablet 3   melatonin 5 MG TABS Take 1 tablet (5 mg total) by mouth at bedtime as needed (for sleep). 30 tablet 0   metFORMIN (GLUCOPHAGE) 500 MG tablet Take 1 tablet (500 mg total) by mouth 2 (two) times daily with a meal. 120 tablet 2   metoprolol succinate  (TOPROL XL) 25 MG 24 hr tablet Take 1 tablet (25 mg total) by mouth at bedtime. 90 tablet 3   No current facility-administered medications on file prior to visit.    Family History  Problem Relation Age of Onset   Heart disease Mother        CABG   Kidney disease Sister    Diabetes Brother    Kidney disease Brother        KIDNEY TRANSPLANT   Hypertension Sister    Healthy Daughter    Healthy Daughter    Healthy Son     Social History   Socioeconomic History   Marital status: Divorced    Spouse name: Not on file   Number of children: 3   Years of education: Not on file   Highest education level: Not on file  Occupational History   Occupation: Retired  Tobacco Use   Smoking status: Former    Current packs/day: 0.00    Average packs/day: 1 pack/day for 49.4 years (49.4 ttl pk-yrs)    Types: Cigarettes    Start date: 08/07/1970    Quit date: 12/15/2019    Years since quitting: 3.4   Smokeless tobacco: Never  Vaping Use   Vaping status: Never Used  Substance and Sexual Activity  Alcohol use: Not Currently   Drug use: Never   Sexual activity: Not on file    Comment: YES  Other Topics Concern   Not on file  Social History Narrative   Lives with son   Social Drivers of Health   Financial Resource Strain: Medium Risk (04/24/2023)   Overall Financial Resource Strain (CARDIA)    Difficulty of Paying Living Expenses: Somewhat hard  Food Insecurity: Food Insecurity Present (04/24/2023)   Hunger Vital Sign    Worried About Running Out of Food in the Last Year: Sometimes true    Ran Out of Food in the Last Year: Sometimes true  Transportation Needs: No Transportation Needs (04/24/2023)   PRAPARE - Administrator, Civil Service (Medical): No    Lack of Transportation (Non-Medical): No  Physical Activity: Sufficiently Active (04/24/2023)   Exercise Vital Sign    Days of Exercise per Week: 3 days    Minutes of Exercise per Session: 60 min  Stress: No Stress  Concern Present (04/24/2023)   [employer] of Occupational Health - Occupational Stress Questionnaire    Feeling of Stress : Not at all  Social Connections: Moderately Isolated (04/24/2023)   Social Connection and Isolation Panel [NHANES]    Frequency of Communication with Friends and Family: Three times a week    Frequency of Social Gatherings with Friends and Family: Twice a week    Attends Religious Services: More than 4 times per year    Active Member of [employer] or Organizations: No    Attends Banker Meetings: Never    Marital Status: Divorced  Catering manager Violence: Patient Unable To Answer (04/24/2023)   Humiliation, Afraid, Rape, and Kick questionnaire    Fear of Current or Ex-Partner: Patient unable to answer    Emotionally Abused: Patient unable to answer    Physically Abused: Patient unable to answer    Sexually Abused: Patient unable to answer    Review of Systems: ROS negative except for what is noted on the assessment and plan.  Objective:   Vitals:   06/06/23 1024  BP: 127/72  Pulse: 81  TempSrc: Oral  SpO2: 100%  Weight: 187 lb 3.2 oz (84.9 kg)  Height: 6\' 1"  (1.854 m)    Physical Exam: Constitutional: well-appearing, in no acute distress Cardiovascular: regular rate and rhythm, no m/r/g Pulmonary/Chest: normal work of breathing on room air, lungs clear to auscultation bilaterally Abdominal: soft, non-tender, non-distended MSK: normal bulk and tone Neurological: alert & oriented x 3, normal gait Skin: warm and dry  Assessment & Plan:  Essential hypertension Home medications include losartan 25 mg qd, metoprolol succinate 25 mg qd, IMDUR 30 mg every day.  He endorses taking Imdur 30 mg twice daily.  He is not able to recall if he is taking losartan or metoprolol succinate.  Blood pressure reading at 127/72 today.  He denies dizziness or side effects from current medications.  He tried to call his son to confirm his home medications  during visit but was unable to reach him. P: Imdur 60 mg daily sent to pharmacy I will call patient and attempt to confirm that he is taking losartan and metoprolol succinate this afternoon.  If so then I would like him to continue this medication  Patient discussed with Dr. Precious Bard Joelys Staubs, D.O. [facility] Internal Medicine  PGY-3 Pager: [phone]  Phone: [phone] Date 06/06/2023  Time 1:15 PM

## 2023-06-07 ENCOUNTER — Encounter: Payer: Medicare HMO | Admitting: Internal Medicine

## 2023-06-07 NOTE — Addendum Note (Signed)
Addended by: Dickie La on: 06/07/2023 11:06 AM   Modules accepted: Level of Service

## 2023-06-07 NOTE — Progress Notes (Signed)
Internal Medicine Clinic Attending  Case discussed with the resident at the time of the visit.  We reviewed the resident's history and exam and pertinent patient test results.  I agree with the assessment, diagnosis, and plan of care documented in the resident's note.

## 2023-06-27 ENCOUNTER — Inpatient Hospital Stay: Payer: Medicare HMO | Attending: Internal Medicine

## 2023-06-27 DIAGNOSIS — C3491 Malignant neoplasm of unspecified part of right bronchus or lung: Secondary | ICD-10-CM | POA: Diagnosis not present

## 2023-06-27 DIAGNOSIS — Z95828 Presence of other vascular implants and grafts: Secondary | ICD-10-CM

## 2023-06-27 DIAGNOSIS — Z452 Encounter for adjustment and management of vascular access device: Secondary | ICD-10-CM | POA: Diagnosis not present

## 2023-06-27 DIAGNOSIS — C7931 Secondary malignant neoplasm of brain: Secondary | ICD-10-CM | POA: Insufficient documentation

## 2023-06-27 MED ORDER — SODIUM CHLORIDE 0.9% FLUSH
10.0000 mL | Freq: Once | INTRAVENOUS | Status: AC
Start: 2023-06-27 — End: 2023-06-27
  Administered 2023-06-27: 10 mL

## 2023-06-27 MED ORDER — HEPARIN SOD (PORK) LOCK FLUSH 100 UNIT/ML IV SOLN
500.0000 [IU] | Freq: Once | INTRAVENOUS | Status: AC
  Administered 2023-06-27: 500 [IU]

## 2023-07-31 ENCOUNTER — Ambulatory Visit: Admitting: Student

## 2023-07-31 VITALS — BP 132/72 | HR 76 | Temp 97.6°F | Ht 73.0 in | Wt 187.6 lb

## 2023-07-31 DIAGNOSIS — I10 Essential (primary) hypertension: Secondary | ICD-10-CM

## 2023-07-31 MED ORDER — LOSARTAN POTASSIUM 50 MG PO TABS: 50.0000 mg | ORAL_TABLET | Freq: Every day | ORAL | 3 refills | Status: DC

## 2023-07-31 NOTE — Assessment & Plan Note (Signed)
 Patient presents to discuss his medications.  For his blood pressure, he is only taking metoprolol succinate 25 mg, and Imdur 60 mg.  He is not taking the losartan prescribed to him.  His blood pressure today is 132/72.  Given his history of cardiomyopathy, I think he would benefit from an ARB more so than his Imdur.  I have discussed with him to stop taking the Imdur, and will start losartan 50 mg.  So his regimen will entail losartan 50 mg, and metoprolol 25 mg daily.  He will follow-up in about a month, at that time will need a BMP and titrate medication as necessary, can consider adding on Imdur if necessary as well.  Plan: - Start losartan 50 mg - Continue metoprolol 25 mg - Stop Imdur - BMP at next visit

## 2023-07-31 NOTE — Progress Notes (Signed)
 CC: Medication reconciliation   HPI:  [name] is a 68 y.o. male living with a history stated below and presents today to discuss his medication. Please see problem based assessment and plan for additional details.  Past Medical History:  Diagnosis Date   Brain mass    Bursitis of right hip    Chest pain 08/12/2018   Chronic cough    Dependence on nicotine from cigarettes    Diabetes mellitus without complication (HCC)    Dysuria 04/03/2021   Essential hypertension 07/28/2018   Frequent headaches 06/27/2020   Metastatic lung cancer (metastasis from lung to other site) [facility])    nscl ca dx'd 12/2019   Polyarthralgia 06/15/2020   Seizures (HCC)    Viral illness 03/23/2019    Current Outpatient Medications on File Prior to Visit  Medication Sig Dispense Refill   aspirin EC 81 MG tablet Take 1 tablet (81 mg total) by mouth daily. Swallow whole.     Blood Glucose Monitoring Suppl (ACCU-CHEK GUIDE) w/Device KIT Use glucose meter to check your blood sugar. 1 kit 0   glucose blood test strip Use a new test strip each time to check your blood sugar. 100 each 12   hydrOXYzine (ATARAX) 10 MG tablet TAKE 1 TABLET(10 MG) BY MOUTH THREE TIMES DAILY AS NEEDED 30 tablet 0   Lancets (ONETOUCH DELICA PLUS LANCET30G) MISC Use a new lancet each time to check your blood sugar. 100 each 11   levETIRAcetam (KEPPRA) 750 MG tablet TAKE 1 TABLET(750 MG) BY MOUTH TWICE DAILY 180 tablet 4   melatonin 5 MG TABS Take 1 tablet (5 mg total) by mouth at bedtime as needed (for sleep). 30 tablet 0   metFORMIN (GLUCOPHAGE) 500 MG tablet Take 1 tablet (500 mg total) by mouth 2 (two) times daily with a meal. 120 tablet 2   metoprolol succinate (TOPROL XL) 25 MG 24 hr tablet Take 1 tablet (25 mg total) by mouth at bedtime. 90 tablet 3   No current facility-administered medications on file prior to visit.    Family History  Problem Relation Age of Onset   Heart disease Mother        CABG   Kidney  disease Sister    Diabetes Brother    Kidney disease Brother        KIDNEY TRANSPLANT   Hypertension Sister    Healthy Daughter    Healthy Daughter    Healthy Son     Social History   Socioeconomic History   Marital status: Divorced    Spouse name: Not on file   Number of children: 3   Years of education: Not on file   Highest education level: Not on file  Occupational History   Occupation: Retired  Tobacco Use   Smoking status: Former    Current packs/day: 0.00    Average packs/day: 1 pack/day for 49.4 years (49.4 ttl pk-yrs)    Types: Cigarettes    Start date: 08/07/1970    Quit date: 12/15/2019    Years since quitting: 3.6   Smokeless tobacco: Never  Vaping Use   Vaping status: Never Used  Substance and Sexual Activity   Alcohol use: Not Currently   Drug use: Never   Sexual activity: Not on file    Comment: YES  Other Topics Concern   Not on file  Social History Narrative   Lives with son   Social Drivers of Health   Financial Resource Strain: Medium Risk (04/24/2023)  Overall Financial Resource Strain (CARDIA)    Difficulty of Paying Living Expenses: Somewhat hard  Food Insecurity: Food Insecurity Present (04/24/2023)   Hunger Vital Sign    Worried About Running Out of Food in the Last Year: Sometimes true    Ran Out of Food in the Last Year: Sometimes true  Transportation Needs: No Transportation Needs (04/24/2023)   PRAPARE - Administrator, Civil Service (Medical): No    Lack of Transportation (Non-Medical): No  Physical Activity: Sufficiently Active (04/24/2023)   Exercise Vital Sign    Days of Exercise per Week: 3 days    Minutes of Exercise per Session: 60 min  Stress: No Stress Concern Present (04/24/2023)   [employer] of Occupational Health - Occupational Stress Questionnaire    Feeling of Stress : Not at all  Social Connections: Moderately Isolated (04/24/2023)   Social Connection and Isolation Panel [NHANES]    Frequency of  Communication with Friends and Family: Three times a week    Frequency of Social Gatherings with Friends and Family: Twice a week    Attends Religious Services: More than 4 times per year    Active Member of [employer] or Organizations: No    Attends Banker Meetings: Never    Marital Status: Divorced  Catering manager Violence: Patient Unable To Answer (04/24/2023)   Humiliation, Afraid, Rape, and Kick questionnaire    Fear of Current or Ex-Partner: Patient unable to answer    Emotionally Abused: Patient unable to answer    Physically Abused: Patient unable to answer    Sexually Abused: Patient unable to answer    Review of Systems: ROS negative except for what is noted on the assessment and plan.  Vitals:   07/31/23 0924  BP: 132/72  Pulse: 76  Temp: 97.6 F (36.4 C)  TempSrc: Oral  SpO2: 98%  Weight: 187 lb 9.6 oz (85.1 kg)  Height: 6\' 1"  (1.854 m)    Physical Exam: Constitutional: well-appearing male  in no acute distress Cardiovascular: regular rate and rhythm, no m/r/g Pulmonary/Chest: normal work of breathing on room air, lungs clear to auscultation bilaterally Abdominal: soft, non-tender, non-distended Skin: warm and dry, port in right upper chest   Assessment & Plan:   Essential hypertension Patient presents to discuss his medications.  For his blood pressure, he is only taking metoprolol succinate 25 mg, and Imdur 60 mg.  He is not taking the losartan prescribed to him.  His blood pressure today is 132/72.  Given his history of cardiomyopathy, I think he would benefit from an ARB more so than his Imdur.  I have discussed with him to stop taking the Imdur, and will start losartan 50 mg.  So his regimen will entail losartan 50 mg, and metoprolol 25 mg daily.  He will follow-up in about a month, at that time will need a BMP and titrate medication as necessary, can consider adding on Imdur if necessary as well.  Plan: - Start losartan 50 mg - Continue  metoprolol 25 mg - Stop Imdur - BMP at next visit   Patient discussed with Dr. Rockey Situ, M.D. [facility] Medicine, PGY-2 Pager: [phone] Date 07/31/2023 Time 10:32 AM

## 2023-07-31 NOTE — Patient Instructions (Addendum)
 Thank you so much for coming to the clinic today!   Please STOP taking the Imdur, we are going to start you on a different medication called Losartan. I'd like to see you back in about a month to go over all of these again.    If you have any questions please feel free to the call the clinic at anytime at [phone]. It was a pleasure seeing you!  Best, Dr. Thomasene Ripple

## 2023-08-01 ENCOUNTER — Encounter: Payer: Medicare HMO | Admitting: Student

## 2023-08-08 ENCOUNTER — Inpatient Hospital Stay: Payer: Medicare HMO | Attending: Internal Medicine

## 2023-08-08 VITALS — BP 166/72 | HR 66 | Temp 97.8°F | Resp 16

## 2023-08-08 DIAGNOSIS — Z95828 Presence of other vascular implants and grafts: Secondary | ICD-10-CM

## 2023-08-08 DIAGNOSIS — C3491 Malignant neoplasm of unspecified part of right bronchus or lung: Secondary | ICD-10-CM | POA: Diagnosis not present

## 2023-08-08 DIAGNOSIS — Z452 Encounter for adjustment and management of vascular access device: Secondary | ICD-10-CM | POA: Diagnosis not present

## 2023-08-08 MED ORDER — SODIUM CHLORIDE 0.9% FLUSH
10.0000 mL | Freq: Once | INTRAVENOUS | Status: AC
  Administered 2023-08-08: 10 mL

## 2023-08-08 MED ORDER — HEPARIN SOD (PORK) LOCK FLUSH 100 UNIT/ML IV SOLN
500.0000 [IU] | Freq: Once | INTRAVENOUS | Status: AC
  Administered 2023-08-08: 500 [IU]

## 2023-08-09 NOTE — Progress Notes (Signed)
 Internal Medicine Clinic Attending  Case discussed with the resident at the time of the visit.  We reviewed the resident's history and exam and pertinent patient test results.  I agree with the assessment, diagnosis, and plan of care documented in the resident's note.

## 2023-08-10 ENCOUNTER — Other Ambulatory Visit: Payer: Self-pay | Admitting: Student

## 2023-08-10 DIAGNOSIS — E119 Type 2 diabetes mellitus without complications: Secondary | ICD-10-CM

## 2023-08-12 NOTE — Telephone Encounter (Signed)
 Medication sent to pharmacy

## 2023-08-29 ENCOUNTER — Telehealth: Payer: Self-pay | Admitting: *Deleted

## 2023-08-29 NOTE — Telephone Encounter (Signed)
 Return call to pt's sister - no answer, left message on vm of office's return call.

## 2023-08-29 NOTE — Telephone Encounter (Signed)
 Copied from CRM [id number]. Topic: Clinical - Medication Question >> Aug 29, 2023  9:48 AM Corin V wrote: Reason for CRM: Patient sister, Genevia Kern, called to confirm his appointment and stated that the voicemail to confirm mentioned him changing when he takes his blood pressure medication to 2 hours earlier that day. Please call her back to confirm any medication changes needed for 4/21. Call back is [phone]

## 2023-09-02 ENCOUNTER — Encounter: Admitting: Internal Medicine

## 2023-09-02 NOTE — Progress Notes (Deleted)
 Subjective:  CC: ***  HPI:  Jared Shea. is a 68 y.o. male with a past medical history of metastatic lung cancer s/p cranectomy, HTN, diabetes who presents today for blood pressure follow-up.   He was last seen by Dr. Mark Sil 1/25. Noted to be radio graphically stable and in observation for NSCLC with oncology.  Please see problem based assessment and plan for additional details.  Past Medical History:  Diagnosis Date   Brain mass    Bursitis of right hip    Chest pain 08/12/2018   Chronic cough    Dependence on nicotine  from cigarettes    Diabetes mellitus without complication (HCC)    Dysuria 04/03/2021   Essential hypertension 07/28/2018   Frequent headaches 06/27/2020   Metastatic lung cancer (metastasis from lung to other site) [facility])    nscl ca dx'd 12/2019   Polyarthralgia 06/15/2020   Seizures (HCC)    Viral illness 03/23/2019    MEDICATIONS:  Atarax  10 mg Keppra  750 mg BID Metformin  500 mg bid Metoprolol  25 mg at bedtime Losartan  50 mg Asa 81 mg  Family History  Problem Relation Age of Onset   Heart disease Mother        CABG   Kidney disease Sister    Diabetes Brother    Kidney disease Brother        KIDNEY TRANSPLANT   Hypertension Sister    Healthy Daughter    Healthy Daughter    Healthy Son     Past Surgical History:  Procedure Laterality Date   APPLICATION OF CRANIAL NAVIGATION N/A 01/07/2020   Procedure: APPLICATION OF CRANIAL NAVIGATION;  Surgeon: Audie Bleacher, MD;  Location: [facility] OR;  Service: Neurosurgery;  Laterality: N/A;   CRANIOTOMY Right 01/07/2020   Procedure: RIGHT CRANIOTOMY FOR TUMOR RESECTION;  Surgeon: Audie Bleacher, MD;  Location: [facility] OR;  Service: Neurosurgery;  Laterality: Right;  rm 21   ENDOBRONCHIAL ULTRASOUND N/A 12/21/2019   Procedure: ENDOBRONCHIAL ULTRASOUND;  Surgeon: Margaretann Sharper, MD;  Location: [facility] ENDOSCOPY;  Service: Pulmonary;  Laterality: N/A;   FINE NEEDLE ASPIRATION  12/21/2019   Procedure: FINE NEEDLE  ASPIRATION (FNA) LINEAR;  Surgeon: Margaretann Sharper, MD;  Location: [facility] ENDOSCOPY;  Service: Pulmonary;;   IR IMAGING GUIDED PORT INSERTION  03/03/2020   NO PAST SURGERIES     VIDEO BRONCHOSCOPY N/A 12/21/2019   Procedure: VIDEO BRONCHOSCOPY WITHOUT FLUORO;  Surgeon: Margaretann Sharper, MD;  Location: [facility] ENDOSCOPY;  Service: Pulmonary;  Laterality: N/A;     Social History   Socioeconomic History   Marital status: Divorced    Spouse name: Not on file   Number of children: 3   Years of education: Not on file   Highest education level: Not on file  Occupational History   Occupation: Retired  Tobacco Use   Smoking status: Former    Current packs/day: 0.00    Average packs/day: 1 pack/day for 49.4 years (49.4 ttl pk-yrs)    Types: Cigarettes    Start date: 08/07/1970    Quit date: 12/15/2019    Years since quitting: 3.7   Smokeless tobacco: Never  Vaping Use   Vaping status: Never Used  Substance and Sexual Activity   Alcohol use: Not Currently   Drug use: Never   Sexual activity: Not on file    Comment: YES  Other Topics Concern   Not on file  Social History Narrative   Lives with son   Social Drivers of Health  Financial Resource Strain: Medium Risk (04/24/2023)   Overall Financial Resource Strain (CARDIA)    Difficulty of Paying Living Expenses: Somewhat hard  Food Insecurity: Food Insecurity Present (04/24/2023)   Hunger Vital Sign    Worried About Running Out of Food in the Last Year: Sometimes true    Ran Out of Food in the Last Year: Sometimes true  Transportation Needs: No Transportation Needs (04/24/2023)   PRAPARE - Administrator, Civil Service (Medical): No    Lack of Transportation (Non-Medical): No  Physical Activity: Sufficiently Active (04/24/2023)   Exercise Vital Sign    Days of Exercise per Week: 3 days    Minutes of Exercise per Session: 60 min  Stress: No Stress Concern Present (04/24/2023)   [employer] of Occupational Health -  Occupational Stress Questionnaire    Feeling of Stress : Not at all  Social Connections: Moderately Isolated (04/24/2023)   Social Connection and Isolation Panel [NHANES]    Frequency of Communication with Friends and Family: Three times a week    Frequency of Social Gatherings with Friends and Family: Twice a week    Attends Religious Services: More than 4 times per year    Active Member of [employer] or Organizations: No    Attends Banker Meetings: Never    Marital Status: Divorced  Catering manager Violence: Patient Unable To Answer (04/24/2023)   Humiliation, Afraid, Rape, and Kick questionnaire    Fear of Current or Ex-Partner: Patient unable to answer    Emotionally Abused: Patient unable to answer    Physically Abused: Patient unable to answer    Sexually Abused: Patient unable to answer    Review of Systems: ROS negative except for what is noted on the assessment and plan.  Objective:  There were no vitals filed for this visit.  Physical Exam: Constitutional: well-appearing *** sitting in ***, in no acute distress HENT: normocephalic atraumatic, mucous membranes moist Eyes: conjunctiva non-erythematous Neck: supple Cardiovascular: regular rate and rhythm, no m/r/g Pulmonary/Chest: normal work of breathing on room air, lungs clear to auscultation bilaterally Abdominal: soft, non-tender, non-distended MSK: normal bulk and tone Neurological: alert & oriented x 3, 5/5 strength in bilateral upper and lower extremities, normal gait Skin: warm and dry Psych: ***     Assessment & Plan:  No problem-specific Assessment & Plan notes found for this encounter.    HTN Losartan  50 mg and metoprolol  25 mg  Patient {[id number]::"discussed with","seen with"} Dr. {[id number]::"[id number]","I. Hoffman","Mullen","Narendra","Machen","Vincent","Guilloud","Lau"}   [employer], D.O. [facility] Internal Medicine  PGY-3 Pager: [phone]  Phone: [phone] Date  09/02/2023  Time 8:14 AM

## 2023-09-05 ENCOUNTER — Encounter: Payer: Self-pay | Admitting: Student

## 2023-09-05 ENCOUNTER — Ambulatory Visit (INDEPENDENT_AMBULATORY_CARE_PROVIDER_SITE_OTHER): Payer: Self-pay | Admitting: Student

## 2023-09-05 VITALS — BP 153/75 | HR 65 | Temp 98.0°F | Ht 73.0 in | Wt 189.5 lb

## 2023-09-05 DIAGNOSIS — E119 Type 2 diabetes mellitus without complications: Secondary | ICD-10-CM | POA: Diagnosis not present

## 2023-09-05 DIAGNOSIS — E785 Hyperlipidemia, unspecified: Secondary | ICD-10-CM

## 2023-09-05 DIAGNOSIS — I10 Essential (primary) hypertension: Secondary | ICD-10-CM

## 2023-09-05 DIAGNOSIS — Z0000 Encounter for general adult medical examination without abnormal findings: Secondary | ICD-10-CM

## 2023-09-05 DIAGNOSIS — B1710 Acute hepatitis C without hepatic coma: Secondary | ICD-10-CM

## 2023-09-05 DIAGNOSIS — Z1159 Encounter for screening for other viral diseases: Secondary | ICD-10-CM | POA: Diagnosis not present

## 2023-09-05 DIAGNOSIS — C3490 Malignant neoplasm of unspecified part of unspecified bronchus or lung: Secondary | ICD-10-CM

## 2023-09-05 DIAGNOSIS — Z7984 Long term (current) use of oral hypoglycemic drugs: Secondary | ICD-10-CM | POA: Diagnosis not present

## 2023-09-05 LAB — POCT GLYCOSYLATED HEMOGLOBIN (HGB A1C): Hemoglobin A1C: 6.1 % — AB (ref 4.0–5.6)

## 2023-09-05 LAB — GLUCOSE, CAPILLARY: Glucose-Capillary: 149 mg/dL — ABNORMAL HIGH (ref 70–99)

## 2023-09-05 MED ORDER — LOSARTAN POTASSIUM-HCTZ 50-12.5 MG PO TABS: 1.0000 | ORAL_TABLET | Freq: Every day | ORAL | 3 refills | Status: AC

## 2023-09-05 MED ORDER — ATORVASTATIN CALCIUM 20 MG PO TABS: 20.0000 mg | ORAL_TABLET | Freq: Every day | ORAL | 3 refills | Status: DC

## 2023-09-05 NOTE — Assessment & Plan Note (Signed)
 Current Status: monitoring/observation. Follows with oncology and has appt next month. Also follows with neuro-oncology in setting of brain met s/p craniotomy.

## 2023-09-05 NOTE — Progress Notes (Signed)
 CC: routine visit  HPI:  Jared Shea. is a 68 y.o. male living with a history stated below and presents today for routine visit. Please see problem based assessment and plan for additional details.  Past Medical History:  Diagnosis Date   Brain mass    Bursitis of right hip    Chest pain 08/12/2018   Chronic cough    Dependence on nicotine  from cigarettes    Diabetes mellitus without complication (HCC)    Dysuria 04/03/2021   Essential hypertension 07/28/2018   Frequent headaches 06/27/2020   Metastatic lung cancer (metastasis from lung to other site) [facility])    nscl ca dx'd 12/2019   Polyarthralgia 06/15/2020   Seizures (HCC)    Viral illness 03/23/2019    Current Outpatient Medications on File Prior to Visit  Medication Sig Dispense Refill   aspirin  EC 81 MG tablet Take 1 tablet (81 mg total) by mouth daily. Swallow whole.     Blood Glucose Monitoring Suppl (ACCU-CHEK GUIDE) w/Device KIT Use glucose meter to check your blood sugar. 1 kit 0   glucose blood test strip Use a new test strip each time to check your blood sugar. 100 each 12   Lancets (ONETOUCH DELICA PLUS LANCET30G) MISC Use a new lancet each time to check your blood sugar. 100 each 11   levETIRAcetam  (KEPPRA ) 750 MG tablet TAKE 1 TABLET(750 MG) BY MOUTH TWICE DAILY 180 tablet 4   melatonin 5 MG TABS Take 1 tablet (5 mg total) by mouth at bedtime as needed (for sleep). 30 tablet 0   metFORMIN  (GLUCOPHAGE ) 500 MG tablet TAKE 1 TABLET(500 MG) BY MOUTH TWICE DAILY WITH A MEAL 120 tablet 2   metoprolol  succinate (TOPROL  XL) 25 MG 24 hr tablet Take 1 tablet (25 mg total) by mouth at bedtime. 90 tablet 3   No current facility-administered medications on file prior to visit.    Family History  Problem Relation Age of Onset   Heart disease Mother        CABG   Kidney disease Sister    Diabetes Brother    Kidney disease Brother        KIDNEY TRANSPLANT   Hypertension Sister    Healthy Daughter    Healthy  Daughter    Healthy Son     Social History   Socioeconomic History   Marital status: Divorced    Spouse name: Not on file   Number of children: 3   Years of education: Not on file   Highest education level: Not on file  Occupational History   Occupation: Retired  Tobacco Use   Smoking status: Former    Current packs/day: 0.00    Average packs/day: 1 pack/day for 49.4 years (49.4 ttl pk-yrs)    Types: Cigarettes    Start date: 08/07/1970    Quit date: 12/15/2019    Years since quitting: 3.7   Smokeless tobacco: Never  Vaping Use   Vaping status: Never Used  Substance and Sexual Activity   Alcohol use: Not Currently   Drug use: Never   Sexual activity: Not on file    Comment: YES  Other Topics Concern   Not on file  Social History Narrative   Lives with son   Social Drivers of Health   Financial Resource Strain: Medium Risk (04/24/2023)   Overall Financial Resource Strain (CARDIA)    Difficulty of Paying Living Expenses: Somewhat hard  Food Insecurity: Food Insecurity Present (04/24/2023)   Hunger Vital  Sign    Worried About Programme researcher, broadcasting/film/video in the Last Year: Sometimes true    Ran Out of Food in the Last Year: Sometimes true  Transportation Needs: No Transportation Needs (04/24/2023)   PRAPARE - Administrator, Civil Service (Medical): No    Lack of Transportation (Non-Medical): No  Physical Activity: Sufficiently Active (04/24/2023)   Exercise Vital Sign    Days of Exercise per Week: 3 days    Minutes of Exercise per Session: 60 min  Stress: No Stress Concern Present (04/24/2023)   [employer] of Occupational Health - Occupational Stress Questionnaire    Feeling of Stress : Not at all  Social Connections: Moderately Isolated (04/24/2023)   Social Connection and Isolation Panel [NHANES]    Frequency of Communication with Friends and Family: Three times a week    Frequency of Social Gatherings with Friends and Family: Twice a week    Attends  Religious Services: More than 4 times per year    Active Member of [employer] or Organizations: No    Attends Banker Meetings: Never    Marital Status: Divorced  Catering manager Violence: Patient Unable To Answer (04/24/2023)   Humiliation, Afraid, Rape, and Kick questionnaire    Fear of Current or Ex-Partner: Patient unable to answer    Emotionally Abused: Patient unable to answer    Physically Abused: Patient unable to answer    Sexually Abused: Patient unable to answer    Review of Systems: ROS negative except for what is noted on the assessment and plan.  Vitals:   09/05/23 0904 09/05/23 0923 09/05/23 0946  BP: (!) 156/78 (!) 153/73 (!) 153/75  Pulse: 68 66 65  Temp: 98 F (36.7 C)    TempSrc: Oral    SpO2: 99%    Weight: 189 lb 8 oz (86 kg)    Height: 6\' 1"  (1.854 m)     Physical Exam: Constitutional: well-appearing male sitting in chair, in no acute distress Cardiovascular: regular rate and rhythm, no m/r/g Pulmonary/Chest: normal work of breathing on room air, lungs clear to auscultation bilaterally Neurological: alert & oriented x 3   Assessment & Plan:   Essential hypertension BP elevated on repeat 153/75. Taking losartan  50 mg and metoprolol  25 mg daily. Reports taking both. Denies any hypertensive symptoms. Would like to reduce further pill burden if able.   Plan -Stop losartan  50 mg -Start losartan -hydrochlorothiazide  50-12.5 mg daily -Continue metoprolol  (hx of CAD) -BMP today -F/u visit in 3-4 weeks, reassess BP, repeat BMP then   Non-small cell carcinoma of lung, stage 4 w/ mets to brain [facility]) Current Status: monitoring/observation. Follows with oncology and has appt next month. Also follows with neuro-oncology in setting of brain met s/p craniotomy.   Diabetes mellitus (HCC) Status: stable/at goal. A1c today is 6.1. Currently taking metformin  500 mg BID. Eye exam, urine ACR are UTD.   Plan -Continue metformin   -Restart atorvastatin  20 mg  daily  -A1c in 6 months  Hyperlipidemia Hx of CAD, T2DM and HTN. ASCVD score >7.5%. Was on statin therapy but stopped. Discussed restarting which patient agrees. Last LDL at goal at 29 on 04/2023.  Plan -Restart atorvastatin  20 mg daily  -Lipid panel at future visit after stable on statin therapy   Health care maintenance -Agreed for hepatitis C screening today   Patient discussed with Dr. Narendra  Cherron Blitzer, D.O. [facility], PGY-2 Phone: [phone] Date 09/05/2023 Time 2:03 PM

## 2023-09-05 NOTE — Assessment & Plan Note (Signed)
 Status: stable/at goal. A1c today is 6.1. Currently taking metformin  500 mg BID. Eye exam, urine ACR are UTD.   Plan -Continue metformin   -Restart atorvastatin  20 mg daily  -A1c in 6 months

## 2023-09-05 NOTE — Assessment & Plan Note (Signed)
-  Agreed for hepatitis C screening today

## 2023-09-05 NOTE — Assessment & Plan Note (Signed)
 BP elevated on repeat 153/75. Taking losartan  50 mg and metoprolol  25 mg daily. Reports taking both. Denies any hypertensive symptoms. Would like to reduce further pill burden if able.   Plan -Stop losartan  50 mg -Start losartan -hydrochlorothiazide  50-12.5 mg daily -Continue metoprolol  (hx of CAD) -BMP today -F/u visit in 3-4 weeks, reassess BP, repeat BMP then

## 2023-09-05 NOTE — Assessment & Plan Note (Signed)
>>  ASSESSMENT AND PLAN FOR UNCONTROLLED TYPE 2 DIABETES MELLITUS WITH HYPERGLYCEMIA, WITHOUT LONG-TERM CURRENT USE OF INSULIN  (HCC) WRITTEN ON 09/05/2023  2:00 PM BY Jahlil Ziller, DO  Status: stable/at goal. A1c today is 6.1. Currently taking metformin  500 mg BID. Eye exam, urine ACR are UTD.   Plan -Continue metformin   -Restart atorvastatin  20 mg daily  -A1c in 6 months

## 2023-09-05 NOTE — Patient Instructions (Addendum)
 Thank you, [name] for allowing us  to provide your care today. Today we discussed   -Glad you are feeling well! -Blood pressure still high today -STOP losartan  that you have at home -START losartan -hydrochlorothiazide  50-12.5 mg once a day  -Restart your atorvastatin  20 mg once a day for cholesterol  -Diabetes doing well, A1c today 6.1, continue taking your metformin   -Blood work today to check electrolytes, kidneys and screen for hepatitis C  I have ordered the following medication/changed the following medications:  Start the following medications: Meds ordered this encounter  Medications   atorvastatin  (LIPITOR) 20 MG tablet    Sig: Take 1 tablet (20 mg total) by mouth daily.    Dispense:  90 tablet    Refill:  3   losartan -hydrochlorothiazide  (HYZAAR) 50-12.5 MG tablet    Sig: Take 1 tablet by mouth daily.    Dispense:  90 tablet    Refill:  3     Follow up:  4 weeks    Should you have any questions or concerns please call the internal medicine clinic at [phone].    Roselia Snipe, D.O. [facility]

## 2023-09-05 NOTE — Assessment & Plan Note (Signed)
 Hx of CAD, T2DM and HTN. ASCVD score >7.5%. Was on statin therapy but stopped. Discussed restarting which patient agrees. Last LDL at goal at 29 on 04/2023.  Plan -Restart atorvastatin  20 mg daily  -Lipid panel at future visit after stable on statin therapy

## 2023-09-07 LAB — HCV RT-PCR, QUANT (NON-GRAPH)

## 2023-09-08 NOTE — Progress Notes (Addendum)
 Internal Medicine Clinic Attending  Case discussed with the resident at the time of the visit.  We reviewed the resident's history and exam and pertinent patient test results.  I agree with the assessment, diagnosis, and plan of care documented in the resident's note. \  Of note, Patient's Hep C is reactive. He will need a HCV quant PCR

## 2023-09-09 ENCOUNTER — Encounter: Payer: Self-pay | Admitting: Internal Medicine

## 2023-09-09 NOTE — Addendum Note (Signed)
 Addended by: Jearldine Mina on: 09/09/2023 08:51 AM   Modules accepted: Orders

## 2023-09-10 ENCOUNTER — Ambulatory Visit (HOSPITAL_COMMUNITY)
Admission: RE | Admit: 2023-09-10 | Discharge: 2023-09-10 | Disposition: A | Discharge status: Home / Self Care | Source: Ambulatory Visit | Attending: Physician Assistant | Admitting: Physician Assistant

## 2023-09-10 ENCOUNTER — Other Ambulatory Visit (INDEPENDENT_AMBULATORY_CARE_PROVIDER_SITE_OTHER)

## 2023-09-10 ENCOUNTER — Encounter (HOSPITAL_COMMUNITY): Payer: Self-pay

## 2023-09-10 DIAGNOSIS — J439 Emphysema, unspecified: Secondary | ICD-10-CM | POA: Diagnosis not present

## 2023-09-10 DIAGNOSIS — C3491 Malignant neoplasm of unspecified part of right bronchus or lung: Secondary | ICD-10-CM | POA: Diagnosis not present

## 2023-09-10 DIAGNOSIS — Z1159 Encounter for screening for other viral diseases: Secondary | ICD-10-CM

## 2023-09-10 DIAGNOSIS — K5730 Diverticulosis of large intestine without perforation or abscess without bleeding: Secondary | ICD-10-CM | POA: Diagnosis not present

## 2023-09-10 DIAGNOSIS — K8020 Calculus of gallbladder without cholecystitis without obstruction: Secondary | ICD-10-CM | POA: Diagnosis not present

## 2023-09-10 DIAGNOSIS — C3490 Malignant neoplasm of unspecified part of unspecified bronchus or lung: Secondary | ICD-10-CM | POA: Diagnosis not present

## 2023-09-10 LAB — BMP8+ANION GAP
Anion Gap: 18 mmol/L (ref 10.0–18.0)
BUN/Creatinine Ratio: 25 — ABNORMAL HIGH (ref 10–24)
BUN: 16 mg/dL (ref 8–27)
CO2: 20 mmol/L (ref 20–29)
Calcium: 9.2 mg/dL (ref 8.6–10.2)
Chloride: 105 mmol/L (ref 96–106)
Creatinine, Ser: 0.65 mg/dL — ABNORMAL LOW (ref 0.76–1.27)
Glucose: 142 mg/dL — ABNORMAL HIGH (ref 70–99)
Potassium: 4.9 mmol/L (ref 3.5–5.2)
Sodium: 143 mmol/L (ref 134–144)
eGFR: 102 mL/min/{1.73_m2} (ref >59)

## 2023-09-10 LAB — HCV RT-PCR, QUANT (NON-GRAPH)

## 2023-09-10 LAB — HCV AB W REFLEX TO QUANT PCR: HCV Ab: REACTIVE — AB

## 2023-09-10 MED ORDER — IOHEXOL 9 MG/ML PO SOLN
ORAL | Status: AC
  Filled 2023-09-10: qty 1000

## 2023-09-10 MED ORDER — IOHEXOL 300 MG/ML  SOLN
100.0000 mL | Freq: Once | INTRAMUSCULAR | Status: AC | PRN
Start: 2023-09-10 — End: 2023-09-10
  Administered 2023-09-10: 100 mL via INTRAVENOUS

## 2023-09-10 MED ORDER — HEPARIN SOD (PORK) LOCK FLUSH 100 UNIT/ML IV SOLN
INTRAVENOUS | Status: AC
Start: 2023-09-10 — End: ?
  Filled 2023-09-10: qty 5

## 2023-09-10 MED ORDER — SODIUM CHLORIDE (PF) 0.9 % IJ SOLN
INTRAMUSCULAR | Status: AC
  Filled 2023-09-10: qty 50

## 2023-09-10 MED ORDER — HEPARIN SOD (PORK) LOCK FLUSH 100 UNIT/ML IV SOLN
500.0000 [IU] | Freq: Once | INTRAVENOUS | Status: AC
  Administered 2023-09-10: 500 [IU] via INTRAVENOUS

## 2023-09-10 MED ORDER — IOHEXOL 9 MG/ML PO SOLN
1000.0000 mL | ORAL | Status: AC
  Administered 2023-09-10: 1000 mL via ORAL

## 2023-09-12 DIAGNOSIS — B1920 Unspecified viral hepatitis C without hepatic coma: Secondary | ICD-10-CM | POA: Insufficient documentation

## 2023-09-12 DIAGNOSIS — B1710 Acute hepatitis C without hepatic coma: Secondary | ICD-10-CM | POA: Insufficient documentation

## 2023-09-12 DIAGNOSIS — B182 Chronic viral hepatitis C: Secondary | ICD-10-CM | POA: Insufficient documentation

## 2023-09-12 HISTORY — DX: Chronic viral hepatitis C: B18.2

## 2023-09-12 LAB — HCV RNA QUANT
HCV log10: 6.262 {Log_IU}/mL
Hepatitis C Quantitation: 1830000 [IU]/mL

## 2023-09-12 NOTE — Addendum Note (Signed)
 Addended by: Jearldine Mina on: 09/12/2023 08:32 AM   Modules accepted: Orders

## 2023-09-13 ENCOUNTER — Encounter (HOSPITAL_COMMUNITY): Payer: Self-pay | Admitting: *Deleted

## 2023-09-13 ENCOUNTER — Ambulatory Visit: Payer: Self-pay

## 2023-09-13 ENCOUNTER — Encounter: Payer: Self-pay | Admitting: Internal Medicine

## 2023-09-13 ENCOUNTER — Emergency Department (HOSPITAL_COMMUNITY)
Admission: EM | Admit: 2023-09-13 | Discharge: 2023-09-14 | Disposition: A | Discharge status: Home / Self Care | Attending: Emergency Medicine | Admitting: Emergency Medicine

## 2023-09-13 ENCOUNTER — Other Ambulatory Visit: Payer: Self-pay

## 2023-09-13 DIAGNOSIS — Z7982 Long term (current) use of aspirin: Secondary | ICD-10-CM | POA: Diagnosis not present

## 2023-09-13 DIAGNOSIS — E860 Dehydration: Secondary | ICD-10-CM | POA: Insufficient documentation

## 2023-09-13 DIAGNOSIS — N179 Acute kidney failure, unspecified: Secondary | ICD-10-CM | POA: Insufficient documentation

## 2023-09-13 DIAGNOSIS — R42 Dizziness and giddiness: Secondary | ICD-10-CM | POA: Diagnosis present

## 2023-09-13 LAB — URINALYSIS, ROUTINE W REFLEX MICROSCOPIC
Bilirubin Urine: NEGATIVE
Glucose, UA: 150 mg/dL — AB
Hgb urine dipstick: NEGATIVE
Ketones, ur: NEGATIVE mg/dL
Leukocytes,Ua: NEGATIVE
Nitrite: NEGATIVE
Protein, ur: 30 mg/dL — AB
Specific Gravity, Urine: 1.024 (ref 1.005–1.030)
pH: 5 (ref 5.0–8.0)

## 2023-09-13 LAB — CBC
HCT: 38.2 % — ABNORMAL LOW (ref 39.0–52.0)
Hemoglobin: 12.9 g/dL — ABNORMAL LOW (ref 13.0–17.0)
MCH: 29.8 pg (ref 26.0–34.0)
MCHC: 33.8 g/dL (ref 30.0–36.0)
MCV: 88.2 fL (ref 80.0–100.0)
Platelets: 473 10*3/uL — ABNORMAL HIGH (ref 150–400)
RBC: 4.33 MIL/uL (ref 4.22–5.81)
RDW: 14.8 % (ref 11.5–15.5)
WBC: 9.4 10*3/uL (ref 4.0–10.5)
nRBC: 0 % (ref 0.0–0.2)

## 2023-09-13 LAB — COMPREHENSIVE METABOLIC PANEL WITH GFR
ALT: 15 U/L (ref 0–44)
AST: 19 U/L (ref 15–41)
Albumin: 4.3 g/dL (ref 3.5–5.0)
Alkaline Phosphatase: 55 U/L (ref 38–126)
Anion gap: 13 (ref 5–15)
BUN: 21 mg/dL (ref 8–23)
CO2: 22 mmol/L (ref 22–32)
Calcium: 9.1 mg/dL (ref 8.9–10.3)
Chloride: 101 mmol/L (ref 98–111)
Creatinine, Ser: 1.22 mg/dL (ref 0.61–1.24)
GFR, Estimated: >60 mL/min (ref >60)
Glucose, Bld: 168 mg/dL — ABNORMAL HIGH (ref 70–99)
Potassium: 3.5 mmol/L (ref 3.5–5.1)
Sodium: 136 mmol/L (ref 135–145)
Total Bilirubin: 0.6 mg/dL (ref 0.0–1.2)
Total Protein: 7.4 g/dL (ref 6.5–8.1)

## 2023-09-13 LAB — LIPASE, BLOOD: Lipase: 22 U/L (ref 11–51)

## 2023-09-13 NOTE — ED Triage Notes (Signed)
 For the past 2 days he has been feeling very bad and he thinks either his bp is high or his glucose is low

## 2023-09-13 NOTE — Telephone Encounter (Signed)
 Chief Complaint: Diarrhea Symptoms: Nausea Frequency: x 2 days Pertinent Negatives: Patient denies fever, vomiting Disposition: [x] ED /[] Urgent Care (no appt availability in office) / [] Appointment(In office/virtual)/ []  [place] [facility]/ [] Home Care/ [] Refused Recommended Disposition /[] [place] [facility]/ []  Follow-up with PCP Additional Notes: Pt reports he has been experiencing loose stools x 2 days, dizziness, weak upon standing, nausea. Pt notes he is now drinking fluids but hadn't been prior/ C/o dry mouth, advised ED for rehydration and eval/treat. Pt declines and notes "I will see how I feel tomorrow". This RN educated pt, continues decline. This RN educated pt on home care, new-worsening symptoms, when to call back/seek emergent care. Pt verbalized understanding and agrees to plan.    Copied From CRM [id number]. Reason for Triage: The patient's sister, Dorthy Gavia states he is currently feeling nauseous, he is having frequent diarrhea, and he is also feeling fatigued. Callback #: [phone]   Reason for Disposition  [1] Drinking very little AND [2] dehydration suspected (e.g., no urine > 12 hours, very dry mouth, very lightheaded)  Answer Assessment - Initial Assessment Questions 1. DIARRHEA SEVERITY: "How bad is the diarrhea?" "How many extra stools have you had in the past 24 hours than normal?"    - MILD (Scale 1-3; CTCAE Grade 1): Few loose or mushy BMs; increase of 1-3 stools over normal daily number of stools; mild increase in ostomy output.   - MODERATE (Scale 4-7; CTCAE Grade 2): Increase of 4-6 stools daily over normal; moderate increase in ostomy output.   - SEVERE (Scale 8-10; or worst possible; CTCAE Grade 3): Increase of 7 or more stools daily over normal; moderate increase in ostomy output; incontinence.     Mild 2. ONSET: "When did the diarrhea begin?"      X 2 days 3. BM CONSISTENCY: "How loose or watery is the diarrhea?" "Is there any blood in the stool?"      Loose, no blood noted 4. BM ODOR: "Does the stool smell worse or different than usual?"     None 5. VOMITING: "Are you also vomiting?" If Yes, ask: "How many times in the past 24 hours?"      None 6. ABDOMEN PAIN: "Are you having any abdomen pain?" If Yes, ask: "What does it feel like?" (e.g., crampy, dull, intermittent, constant)      Nausea 8. ORAL INTAKE: If vomiting, "Have you been able to drink liquids?" "How much fluids have you had in the past 24 hours?"     Pt is trying to push fluids 9. HYDRATION: "Any signs of dehydration?" (e.g., dry mouth [not just dry lips], too weak to stand, dizziness, new weight loss) "When did you last urinate?"     Dry mouth, dizziness, too weak to stand 11. CANCER: "What type of cancer do you have?"       Lung 12. CANCER - TREATMENT: "What cancer treatments have you received?" "When did you last take or receive them?" (e.g., chemotherapy, immunotherapy, radiation, or recent surgery). If triager has access to the patient's medical record, triager should review treatments and administration dates.       Not currently in treatment 13. CANCER - NEUTROPENIA RISK: "Have you received chemotherapy recently? If Yes, ask: "When was it and what was your last WBC and ANC (absolute neutrophil count)?" "Were you told that your white cell count was low?" If triager has access to the patient's medical record, triager should review most recent labs. An ANC less than 1,000 - 1,500 means that  the neutrophils are low and the immune system is weak.       None 14. C DIFF RSK: "Have you ever had c-difficile (C-Diff) diarrhea?"        None 15. DIARRHEA MEDICINES: "Are you taking any medicines right now to make the diarrhea better?" If Yes, ask: "What drugs are you taking?" (e.g., Immodium, Lomotil)       None  Protocols used: Cancer - Diarrhea-A-AH

## 2023-09-14 DIAGNOSIS — N179 Acute kidney failure, unspecified: Secondary | ICD-10-CM | POA: Diagnosis not present

## 2023-09-14 MED ORDER — LACTATED RINGERS IV BOLUS
1000.0000 mL | Freq: Once | INTRAVENOUS | Status: AC
  Administered 2023-09-14: 1000 mL via INTRAVENOUS

## 2023-09-14 NOTE — ED Provider Notes (Signed)
 [place] EMERGENCY DEPARTMENT AT [facility] Provider Note   CSN: [id number] Arrival date & time: 09/13/23  1845     History Chief Complaint  Patient presents with   Hypotension    Jared Shea. is a 68 y.o. male.  68 year old male who presents ER today for feeling abnormal.  Patient states that he just does not feel right might have low blood pressure, high blood pressure, low blood sugar or high blood sugar.  He does not know he just does not feel right.  He recently diagnosed with hepatitis which is shock to him and his mother notes he does not know how he got it.  He has a history of cancer and likely in remission had a follow-up CT scan done recently.  No recent trauma, fevers, cough, nausea, vomiting, diarrhea, constipation or other associated symptoms.  Patient states that he is never really felt like this before.  Dates he is a bit lightheaded if he stands up too fast moves around too fast and just kind feels generally weak all over.  States he had a CT scan done recently they told him that he needed to drink a bunch of water without contrast and he wonders if he did not drink enough.        Home Medications Prior to Admission medications   Medication Sig Start Date End Date Taking? Authorizing Provider  aspirin  EC 81 MG tablet Take 1 tablet (81 mg total) by mouth daily. Swallow whole. 01/08/23   Jonelle Neri, DO  atorvastatin  (LIPITOR) 20 MG tablet Take 1 tablet (20 mg total) by mouth daily. 09/05/23 09/04/24  Zheng, Michael, DO  Blood Glucose Monitoring Suppl (ACCU-CHEK GUIDE) w/Device KIT Use glucose meter to check your blood sugar. 11/12/22   Zheng, Michael, DO  glucose blood test strip Use a new test strip each time to check your blood sugar. 11/12/22   Jearldine Mina, DO  Lancets [facility] DELICA PLUS [place]) MISC Use a new lancet each time to check your blood sugar. 11/09/22   Zheng, Michael, DO  levETIRAcetam  (KEPPRA ) 750 MG tablet TAKE 1 TABLET(750 MG) BY MOUTH  TWICE DAILY 05/06/23   Vaslow, Zachary K, MD  losartan -hydrochlorothiazide  (HYZAAR) 50-12.5 MG tablet Take 1 tablet by mouth daily. 09/05/23 09/04/24  Zheng, Michael, DO  melatonin 5 MG TABS Take 1 tablet (5 mg total) by mouth at bedtime as needed (for sleep). 08/20/22   Jinwala, Sagar H, MD  metFORMIN  (GLUCOPHAGE ) 500 MG tablet TAKE 1 TABLET(500 MG) BY MOUTH TWICE DAILY WITH A MEAL 08/12/23   Zheng, Michael, DO  metoprolol  succinate (TOPROL  XL) 25 MG 24 hr tablet Take 1 tablet (25 mg total) by mouth at bedtime. 06/06/23   Zheng, Michael, DO      Allergies    Jardiance  [empagliflozin ] and Penicillins    Review of Systems   Review of Systems  Physical Exam Updated Vital Signs BP (!) 140/98 (BP Location: Right Arm)   Pulse 94   Temp 98.5 F (36.9 C) (Oral)   Resp 18   Ht 6\' 1"  (1.854 m)   Wt 84.9 kg   SpO2 99%   BMI 24.70 kg/m  Physical Exam Vitals and nursing note reviewed.  Constitutional:      Appearance: He is well-developed.  HENT:     Head: Normocephalic and atraumatic.     Mouth/Throat:     Mouth: Mucous membranes are dry.  Eyes:     Comments: sunken  Cardiovascular:  Rate and Rhythm: Normal rate.  Pulmonary:     Effort: Pulmonary effort is normal. No respiratory distress.  Abdominal:     General: There is no distension.  Musculoskeletal:        General: Normal range of motion.     Cervical back: Normal range of motion.  Neurological:     Mental Status: He is alert.     ED Results / Procedures / Treatments   Labs (all labs ordered are listed, but only abnormal results are displayed) Labs Reviewed  COMPREHENSIVE METABOLIC PANEL WITH GFR - Abnormal; Notable for the following components:      Result Value   Glucose, Bld 168 (*)    All other components within normal limits  CBC - Abnormal; Notable for the following components:   Hemoglobin 12.9 (*)    HCT 38.2 (*)    Platelets 473 (*)    All other components within normal limits  URINALYSIS, ROUTINE W  REFLEX MICROSCOPIC - Abnormal; Notable for the following components:   Color, Urine AMBER (*)    APPearance HAZY (*)    Glucose, UA 150 (*)    Protein, ur 30 (*)    Bacteria, UA RARE (*)    All other components within normal limits  LIPASE, BLOOD    EKG None  Radiology No results found.  Procedures Procedures    Medications Ordered in ED Medications  lactated ringers  bolus 1,000 mL (0 mLs Intravenous Stopped 09/14/23 1610)    ED Course/ Medical Decision Making/ A&P                                 Medical Decision Making  Patient with mild AKI. VS otherwise WNL. Fluids given. Feels improved. No infectious symptoms. No other symptoms to suggest need for further workup, imaging or hospitalization at this time. Referral placed for GI in reference to hepatitis diagnosis. Will fu w/ PCP in a week or two to recheck kidney function.    Final Clinical Impression(s) / ED Diagnoses Final diagnoses:  AKI (acute kidney injury) (HCC)  Dehydration    Rx / DC Orders ED Discharge Orders          Ordered    Ambulatory referral to Gastroenterology        09/14/23 0546              Alicyn Klann, Reymundo Caulk, MD 09/14/23 [phone]

## 2023-09-16 ENCOUNTER — Telehealth: Payer: Self-pay

## 2023-09-16 NOTE — Transitions of Care (Post Inpatient/ED Visit) (Signed)
   09/16/2023  Name: Jared Shea. MRN: [id number] DOB: Sep 04, 1955  Today's TOC FU Call Status: Today's TOC FU Call Status:: Successful TOC FU Call Completed TOC FU Call Complete Date: 09/16/23 Patient's Name and Date of Birth confirmed.  Transition Care Management Follow-up Telephone Call Date of Discharge: 09/12/23 Discharge Facility: [facility]) Type of Discharge: Emergency Department Reason for ED Visit:  (dehydrated) How have you been since you were released from the hospital?: Better Any questions or concerns?: No  Items Reviewed: Did you receive and understand the discharge instructions provided?: Yes Medications obtained,verified, and reconciled?: Yes (Medications Reviewed) Any new allergies since your discharge?: No Dietary orders reviewed?: Yes Do you have support at home?: No  Medications Reviewed Today: Medications Reviewed Today     Reviewed by Darrall Ellison, LPN (Licensed Practical Nurse) on 09/16/23 at 1643  Med List Status: <None>   Medication Order Taking? Sig Documenting Provider Last Dose Status Informant  aspirin  EC 81 MG tablet [id number] No Take 1 tablet (81 mg total) by mouth daily. Swallow whole. Jonelle Neri, DO Taking Active   atorvastatin  (LIPITOR) 20 MG tablet [id number]  Take 1 tablet (20 mg total) by mouth daily. Jearldine Mina, DO  Active   Blood Glucose Monitoring Suppl (ACCU-CHEK GUIDE) w/Device KIT [id number] No Use glucose meter to check your blood sugar. Jearldine Mina, DO Taking Active   glucose blood test strip [id number] No Use a new test strip each time to check your blood sugar. Jearldine Mina, DO Taking Active   Lancets [facility] DELICA PLUS [place]) MISC [id number] No Use a new lancet each time to check your blood sugar. Zheng, Michael, DO Taking Active   levETIRAcetam  (KEPPRA ) 750 MG tablet [id number]  TAKE 1 TABLET(750 MG) BY MOUTH TWICE DAILY Vaslow, Zachary K, MD  Active   losartan -hydrochlorothiazide  (HYZAAR) 50-12.5 MG tablet  [id number]  Take 1 tablet by mouth daily. Jearldine Mina, DO  Active   melatonin 5 MG TABS [id number] No Take 1 tablet (5 mg total) by mouth at bedtime as needed (for sleep). Jinwala, Sagar H, MD Taking Active   metFORMIN  (GLUCOPHAGE ) 500 MG tablet [id number]  TAKE 1 TABLET(500 MG) BY MOUTH TWICE DAILY WITH A MEAL Zheng, Michael, DO  Active   metoprolol  succinate (TOPROL  XL) 25 MG 24 hr tablet [id number]  Take 1 tablet (25 mg total) by mouth at bedtime. Jearldine Mina, DO  Active             Home Care and Equipment/Supplies: Were Home Health Services Ordered?: NA Any new equipment or medical supplies ordered?: NA  Functional Questionnaire: Do you need assistance with bathing/showering or dressing?: No Do you need assistance with meal preparation?: No Do you need assistance with eating?: No Do you have difficulty maintaining continence: No Do you need assistance with getting out of bed/getting out of a chair/moving?: No Do you have difficulty managing or taking your medications?: No  Follow up appointments reviewed: PCP Follow-up appointment confirmed?: Yes Date of PCP follow-up appointment?: 09/19/23 Follow-up Provider: [facility] Follow-up appointment confirmed?: Yes Date of Specialist follow-up appointment?: 09/24/23 Follow-Up Specialty Provider:: GI Do you need transportation to your follow-up appointment?: No Do you understand care options if your condition(s) worsen?: Yes-patient verbalized understanding    SIGNATURE Darrall Ellison, LPN [facility] Nurse Health Advisor Direct Dial [phone]

## 2023-09-19 ENCOUNTER — Inpatient Hospital Stay: Attending: Internal Medicine

## 2023-09-19 ENCOUNTER — Ambulatory Visit (INDEPENDENT_AMBULATORY_CARE_PROVIDER_SITE_OTHER): Payer: Self-pay | Admitting: Student

## 2023-09-19 VITALS — BP 111/63 | HR 80 | Temp 97.8°F | Ht 73.0 in | Wt 188.4 lb

## 2023-09-19 DIAGNOSIS — C3490 Malignant neoplasm of unspecified part of unspecified bronchus or lung: Secondary | ICD-10-CM | POA: Insufficient documentation

## 2023-09-19 DIAGNOSIS — I951 Orthostatic hypotension: Secondary | ICD-10-CM | POA: Diagnosis not present

## 2023-09-19 DIAGNOSIS — N179 Acute kidney failure, unspecified: Secondary | ICD-10-CM

## 2023-09-19 DIAGNOSIS — C7931 Secondary malignant neoplasm of brain: Secondary | ICD-10-CM | POA: Insufficient documentation

## 2023-09-19 DIAGNOSIS — Z95828 Presence of other vascular implants and grafts: Secondary | ICD-10-CM

## 2023-09-19 DIAGNOSIS — B1710 Acute hepatitis C without hepatic coma: Secondary | ICD-10-CM

## 2023-09-19 DIAGNOSIS — I10 Essential (primary) hypertension: Secondary | ICD-10-CM | POA: Diagnosis not present

## 2023-09-19 DIAGNOSIS — B1920 Unspecified viral hepatitis C without hepatic coma: Secondary | ICD-10-CM | POA: Insufficient documentation

## 2023-09-19 LAB — COMPREHENSIVE METABOLIC PANEL WITH GFR
ALT: 18 U/L (ref 0–44)
AST: 19 U/L (ref 15–41)
Albumin: 4.5 g/dL (ref 3.5–5.0)
Alkaline Phosphatase: 55 U/L (ref 38–126)
Anion gap: 12 (ref 5–15)
BUN: 16 mg/dL (ref 8–23)
CO2: 22 mmol/L (ref 22–32)
Calcium: 9 mg/dL (ref 8.9–10.3)
Chloride: 102 mmol/L (ref 98–111)
Creatinine, Ser: 0.81 mg/dL (ref 0.61–1.24)
GFR, Estimated: >60 mL/min (ref >60)
Glucose, Bld: 171 mg/dL — ABNORMAL HIGH (ref 70–99)
Potassium: 3.8 mmol/L (ref 3.5–5.1)
Sodium: 136 mmol/L (ref 135–145)
Total Bilirubin: 0.5 mg/dL (ref 0.0–1.2)
Total Protein: 8 g/dL (ref 6.5–8.1)

## 2023-09-19 MED ORDER — HEPARIN SOD (PORK) LOCK FLUSH 100 UNIT/ML IV SOLN: 500.0000 [IU] | Freq: Once | INTRAVENOUS | Status: DC

## 2023-09-19 MED ORDER — SODIUM CHLORIDE 0.9% FLUSH
10.0000 mL | Freq: Once | INTRAVENOUS | Status: DC
Start: 2023-09-19 — End: 2023-09-19

## 2023-09-19 NOTE — Progress Notes (Signed)
 CC:  Chief Complaint  Patient presents with   Follow-up    Follow up from ed   HPI:  [name] is a 68 y.o. male living with a history stated below and presents today for the above. Please see problem based assessment and plan for additional details.  Past Medical History:  Diagnosis Date   Brain mass    Bursitis of right hip    Chest pain 08/12/2018   Chronic cough    Dependence on nicotine  from cigarettes    Diabetes mellitus without complication (HCC)    Dysuria 04/03/2021   Essential hypertension 07/28/2018   Frequent headaches 06/27/2020   Metastatic lung cancer (metastasis from lung to other site) [facility])    nscl ca dx'd 12/2019   Polyarthralgia 06/15/2020   Seizures (HCC)    Viral illness 03/23/2019    Current Outpatient Medications on File Prior to Visit  Medication Sig Dispense Refill   aspirin  EC 81 MG tablet Take 1 tablet (81 mg total) by mouth daily. Swallow whole.     atorvastatin  (LIPITOR) 20 MG tablet Take 1 tablet (20 mg total) by mouth daily. 90 tablet 3   Blood Glucose Monitoring Suppl (ACCU-CHEK GUIDE) w/Device KIT Use glucose meter to check your blood sugar. 1 kit 0   glucose blood test strip Use a new test strip each time to check your blood sugar. 100 each 12   Lancets (ONETOUCH DELICA PLUS LANCET30G) MISC Use a new lancet each time to check your blood sugar. 100 each 11   levETIRAcetam  (KEPPRA ) 750 MG tablet TAKE 1 TABLET(750 MG) BY MOUTH TWICE DAILY 180 tablet 4   losartan -hydrochlorothiazide  (HYZAAR) 50-12.5 MG tablet Take 1 tablet by mouth daily. 90 tablet 3   melatonin 5 MG TABS Take 1 tablet (5 mg total) by mouth at bedtime as needed (for sleep). 30 tablet 0   metFORMIN  (GLUCOPHAGE ) 500 MG tablet TAKE 1 TABLET(500 MG) BY MOUTH TWICE DAILY WITH A MEAL 120 tablet 2   No current facility-administered medications on file prior to visit.    Family History  Problem Relation Age of Onset   Heart disease Mother        CABG   Kidney disease  Sister    Diabetes Brother    Kidney disease Brother        KIDNEY TRANSPLANT   Hypertension Sister    Healthy Daughter    Healthy Daughter    Healthy Son     Social History   Socioeconomic History   Marital status: Divorced    Spouse name: Not on file   Number of children: 3   Years of education: Not on file   Highest education level: Not on file  Occupational History   Occupation: Retired  Tobacco Use   Smoking status: Former    Current packs/day: 0.00    Average packs/day: 1 pack/day for 49.4 years (49.4 ttl pk-yrs)    Types: Cigarettes    Start date: 08/07/1970    Quit date: 12/15/2019    Years since quitting: 3.7   Smokeless tobacco: Never  Vaping Use   Vaping status: Never Used  Substance and Sexual Activity   Alcohol use: Not Currently   Drug use: Never   Sexual activity: Not on file    Comment: YES  Other Topics Concern   Not on file  Social History Narrative   Lives with son   Social Drivers of Health   Financial Resource Strain: Medium Risk (04/24/2023)  Overall Financial Resource Strain (CARDIA)    Difficulty of Paying Living Expenses: Somewhat hard  Food Insecurity: Food Insecurity Present (04/24/2023)   Hunger Vital Sign    Worried About Running Out of Food in the Last Year: Sometimes true    Ran Out of Food in the Last Year: Sometimes true  Transportation Needs: No Transportation Needs (04/24/2023)   PRAPARE - Administrator, Civil Service (Medical): No    Lack of Transportation (Non-Medical): No  Physical Activity: Sufficiently Active (04/24/2023)   Exercise Vital Sign    Days of Exercise per Week: 3 days    Minutes of Exercise per Session: 60 min  Stress: No Stress Concern Present (04/24/2023)   [employer] of Occupational Health - Occupational Stress Questionnaire    Feeling of Stress : Not at all  Social Connections: Moderately Isolated (04/24/2023)   Social Connection and Isolation Panel [NHANES]    Frequency of  Communication with Friends and Family: Three times a week    Frequency of Social Gatherings with Friends and Family: Twice a week    Attends Religious Services: More than 4 times per year    Active Member of [employer] or Organizations: No    Attends Banker Meetings: Never    Marital Status: Divorced  Catering manager Violence: Patient Unable To Answer (04/24/2023)   Humiliation, Afraid, Rape, and Kick questionnaire    Fear of Current or Ex-Partner: Patient unable to answer    Emotionally Abused: Patient unable to answer    Physically Abused: Patient unable to answer    Sexually Abused: Patient unable to answer    Review of Systems: ROS negative except for what is noted on the assessment and plan.  Vitals:   09/19/23 1312  BP: 111/63  Pulse: 80  Temp: 97.8 F (36.6 C)  TempSrc: Oral  SpO2: 96%  Weight: 188 lb 6.4 oz (85.5 kg)  Height: 6\' 1"  (1.854 m)    Physical Exam: Constitutional: well-appearing, in no acute distress HENT: normocephalic atraumatic, mucous membranes dry Eyes: conjunctiva non-erythematous, non-sclerotic Cardiovascular: regular rate and rhythm, no m/r/g, no BLE edema Pulmonary/Chest: normal work of breathing on room air, lungs clear to auscultation bilaterally Abdominal: soft, non-tender, distended centrally with increased tympanic sounds. No hepatosplenomegaly. MSK: normal bulk and tone Neurological: alert & oriented x 3 Skin: warm and dry, decreased skin turgor Psych: normal mood and behavior  Assessment & Plan:   Patient discussed with Dr. Broadus Canes  Orthostatic hypotension Was recently seen at the ED because he "did not feel well" and had non-specific complaints on 09/13/2023.  On further questioning, he felt like he had a little bit of lightheadedness with changes in position. He had a runny nose, sore throat, congestion, diarrhea x4, and decreased po intake prior to presenting to the ED. Labs were significant for an AKI with a Cr of 1.22. His  baseline is around 0.7.  Since then he reports that he feels about the same. Has had chills and still has runny nose. Lungs are clear to auscultation. He does not have a cough. He still reports feeling light headedness with positional changes. States he still has not had much to drink. Additionally, he had recently seen Dr. Jari Merles at the end of April. At that time, his BP was elevated at 153/75. He was taking losartan  50 and Metoprolol  25mg  daily. Due to hypertension, he was started on Hyzaar 50-12.5mg . He has been feeling somnolent at times.  Orthostatics borderline BP laying is 115/62  standing 96/63 HR with improvement after having three cups of water which took him a significant amount of time to drink. He does have chronotropic incompetence as he is currently on metoprolol . He was instructed to stop the Hyzaar for now, continue drinking plenty of fluids. A CMP was done to check renal function and LFTs given than he just tested positive for Hepatitis C. Cr is back to baseline.   I spoke with him about his goals of care and encouraged him to discuss with his oncologists what his prognosis is. He is currently on remission getting chemotherapy (sp radiation therapy and surgery for his brain metastasis). Ok to stop blood pressure medication for now and monitor. Instructed to cut metoprolol  in half and take 12.5mg  daily. He will monitor for HA, vision changes, chest pain, chest palpitations. He expressed understanding. Requested me to speak with his sister. I attempted to call her but her phone number went straight to voicemail. LVM for her to call us  back.  Hepatitis C Tested positive for hepatitis C during his Hep C screening on 09/12/2023 titer is >1,800. Denies having received blood products in the past, no tattoos, no IVDU. Pt is worried and concerned about his hepatitis. Does not know where he got it from. Messaged our office so he can see Dr. Alwin Baars.  Essential hypertension See orthostatic hypotension  problem. Stopping Hyzaar and decreasing Metoprolol  to 12.5mg  daily. Of note, he does state that he snores very loudly and has been waking up gasping for air. STOP BANG score is at least 6. He may benefit from a sleep study in the future.  Jose Ngo, MD [facility] Internal Medicine, PGY-1 Phone: [phone] Date 09/20/2023 Time 8:58 AM

## 2023-09-19 NOTE — Patient Instructions (Addendum)
 Thank you, [name] for allowing us  to provide your care today.   I have ordered the following labs for you:  Lab Orders         CMP14 + Anion Gap       Follow up: 2 weeks    Please stop taking losartan -hydrochlorothiazide  (HYZAAR) 50-12.5 MG tablet . We will call you with the results of your labs to see if you can restart this medication.   Please drink a water (eight 8 ounce cups of water a day), the tests we did in the office showed you were dehydrated.    Should you have any questions or concerns please call the internal medicine clinic at [phone].     Jose Ngo, MD [facility]

## 2023-09-20 MED ORDER — METOPROLOL SUCCINATE ER 25 MG PO TB24
12.5000 mg | ORAL_TABLET | Freq: Every evening | ORAL | 3 refills | Status: AC
Start: 2023-09-20 — End: ?

## 2023-09-20 NOTE — Assessment & Plan Note (Signed)
 Tested positive for hepatitis C during his Hep C screening on 09/12/2023 titer is >1,800. Denies having received blood products in the past, no tattoos, no IVDU. Pt is worried and concerned about his hepatitis. Does not know where he got it from. Messaged our office so he can see Dr. Alwin Baars.

## 2023-09-20 NOTE — Assessment & Plan Note (Addendum)
 Was recently seen at the ED because he "did not feel well" and had non-specific complaints on 09/13/2023.  On further questioning, he felt like he had a little bit of lightheadedness with changes in position. He had a runny nose, sore throat, congestion, diarrhea x4, and decreased po intake prior to presenting to the ED. Labs were significant for an AKI with a Cr of 1.22. His baseline is around 0.7.  Since then he reports that he feels about the same. Has had chills and still has runny nose. Lungs are clear to auscultation. He does not have a cough. He still reports feeling light headedness with positional changes. States he still has not had much to drink. Additionally, he had recently seen Dr. Jari Merles at the end of April. At that time, his BP was elevated at 153/75. He was taking losartan  50 and Metoprolol  25mg  daily. Due to hypertension, he was started on Hyzaar 50-12.5mg . He has been feeling somnolent at times.  Orthostatics borderline BP laying is 115/62 standing 96/63 HR with improvement after having three cups of water which took him a significant amount of time to drink. He does have chronotropic incompetence as he is currently on metoprolol . He was instructed to stop the Hyzaar for now, continue drinking plenty of fluids. A CMP was done to check renal function and LFTs given than he just tested positive for Hepatitis C. Cr is back to baseline.   I spoke with him about his goals of care and encouraged him to discuss with his oncologists what his prognosis is. He is currently on remission getting chemotherapy (sp radiation therapy and surgery for his brain metastasis). Ok to stop blood pressure medication for now and monitor. Instructed to cut metoprolol  in half and take 12.5mg  daily. He will monitor for HA, vision changes, chest pain, chest palpitations. He expressed understanding. Requested me to speak with his sister. I attempted to call her but her phone number went straight to voicemail. LVM for her to  call us  back.

## 2023-09-20 NOTE — Assessment & Plan Note (Signed)
 See orthostatic hypotension problem. Stopping Hyzaar and decreasing Metoprolol  to 12.5mg  daily. Of note, he does state that he snores very loudly and has been waking up gasping for air. STOP BANG score is at least 6. He may benefit from a sleep study in the future.

## 2023-09-22 NOTE — Progress Notes (Unsigned)
 [facility] OFFICE PROGRESS NOTE  Jearldine Mina, DO [address] [place] [address]  DIAGNOSIS: Stage IV (TX, N2, M1 C) non-small cell lung cancer, adenocarcinoma diagnosed in August 2021 and presented with solitary brain metastasis in addition to mediastinal lymphadenopathy.   PDL1 Expression 70%   Molecular Biomarkers:  Tumor Mutational Burden - 52 Muts/Mb Microsatellite status - MS-Stable Genomic Findings For a complete list of the genes assayed, please refer to the Appendix. NF1 E1694* MTAP loss exons 2-8 RICTOR amplification ATRX A419V BRAF K483E CDKN2A/B CDKN2A loss, CDKN2B loss DNMT3A E205* FGF10 amplification NTRK1 amplification - equivocal? 7 Disease relevant genes with no reportable alterations: ALK, EGFR, ERBB2, KRAS, MET, RET, ROS1  PRIOR THERAPY: 1) Status post right craniotomy with tumor resection followed by [facility] to solitary brain metastasis under the care of Dr. Jeryl Moris and Dr. Liisa Reeves. 2) Concurrent chemoradiation with weekly carboplatin  for AUC of 2 and paclitaxel  45 mg/M2.  First dose 02/01/2020. Status post 5 cycles.  Last dose was given February 29, 2020. 3) First-line treatment with immunotherapy with Libtayo  (Cempilimab) 350 mg IV every 3 weeks. Last dose on 04/10/22.  Status post 35 cycles.   CURRENT THERAPY: Observation  INTERVAL HISTORY: Jared Shea. 68 y.o. male returns to the clinic today for a follow-up visit. The patient recently completed 2 years of immunotherapy with Libtayo  in November 2023 and tolerated well overall except for some joint pain (which has since improved). He has been on observation since that time. From a lung cancer, standpoint, he is doing really well.   In the interval since being seen he did go to the emergency room a week or so ago for not feeling well and hypotension and lightheadedness. He received IVF.    He was found to have hepatitis C recently for which his PCP referred him to infectious disease. Also  one of his anti-hypertensive as discontinued due to his orthostatic hypotension.   Today, he denies any fever, chills, or night sweats.  His appetite is "up and down". He did gain weight overall. His occasional dyspnea is stable. He states overall his breathing is "***". He sometimes may have an intermittent cough but not as much these days. It does not warrant taking a cough medication.  Denies any chest pain or hemoptysis. Denies any nausea, vomiting, diarrhea, or constipation. Denies any rashes or skin changes.  Denies any visual changes.   He follows closely with neuro-oncology  and has a repeat brain MRI expected in October 2025.   Recently had a restaging CT scan performed.  He is here today for evaluation and to review his scan results.    MEDICAL HISTORY: Past Medical History:  Diagnosis Date   Brain mass    Bursitis of right hip    Chest pain 08/12/2018   Chronic cough    Dependence on nicotine  from cigarettes    Diabetes mellitus without complication (HCC)    Dysuria 04/03/2021   Essential hypertension 07/28/2018   Frequent headaches 06/27/2020   Metastatic lung cancer (metastasis from lung to other site) [facility])    nscl ca dx'd 12/2019   Polyarthralgia 06/15/2020   Seizures (HCC)    Viral illness 03/23/2019    ALLERGIES:  is allergic to jardiance  [empagliflozin ] and penicillins.  MEDICATIONS:  Current Outpatient Medications  Medication Sig Dispense Refill   aspirin  EC 81 MG tablet Take 1 tablet (81 mg total) by mouth daily. Swallow whole.     atorvastatin  (LIPITOR) 20 MG tablet Take  1 tablet (20 mg total) by mouth daily. 90 tablet 3   Blood Glucose Monitoring Suppl (ACCU-CHEK GUIDE) w/Device KIT Use glucose meter to check your blood sugar. 1 kit 0   glucose blood test strip Use a new test strip each time to check your blood sugar. 100 each 12   Lancets (ONETOUCH DELICA PLUS LANCET30G) MISC Use a new lancet each time to check your blood sugar. 100 each 11   levETIRAcetam   (KEPPRA ) 750 MG tablet TAKE 1 TABLET(750 MG) BY MOUTH TWICE DAILY 180 tablet 4   losartan -hydrochlorothiazide  (HYZAAR) 50-12.5 MG tablet Take 1 tablet by mouth daily. 90 tablet 3   melatonin 5 MG TABS Take 1 tablet (5 mg total) by mouth at bedtime as needed (for sleep). 30 tablet 0   metFORMIN  (GLUCOPHAGE ) 500 MG tablet TAKE 1 TABLET(500 MG) BY MOUTH TWICE DAILY WITH A MEAL 120 tablet 2   metoprolol  succinate (TOPROL  XL) 25 MG 24 hr tablet Take 0.5 tablets (12.5 mg total) by mouth at bedtime. 90 tablet 3   No current facility-administered medications for this visit.    SURGICAL HISTORY:  Past Surgical History:  Procedure Laterality Date   APPLICATION OF CRANIAL NAVIGATION N/A 01/07/2020   Procedure: APPLICATION OF CRANIAL NAVIGATION;  Surgeon: Audie Bleacher, MD;  Location: [facility] OR;  Service: Neurosurgery;  Laterality: N/A;   CRANIOTOMY Right 01/07/2020   Procedure: RIGHT CRANIOTOMY FOR TUMOR RESECTION;  Surgeon: Audie Bleacher, MD;  Location: [facility] OR;  Service: Neurosurgery;  Laterality: Right;  rm 21   ENDOBRONCHIAL ULTRASOUND N/A 12/21/2019   Procedure: ENDOBRONCHIAL ULTRASOUND;  Surgeon: Margaretann Sharper, MD;  Location: [facility];  Service: Pulmonary;  Laterality: N/A;   FINE NEEDLE ASPIRATION  12/21/2019   Procedure: FINE NEEDLE ASPIRATION (FNA) LINEAR;  Surgeon: Margaretann Sharper, MD;  Location: [facility];  Service: Pulmonary;;   IR IMAGING GUIDED PORT INSERTION  03/03/2020   NO PAST SURGERIES     VIDEO BRONCHOSCOPY N/A 12/21/2019   Procedure: VIDEO BRONCHOSCOPY WITHOUT FLUORO;  Surgeon: Margaretann Sharper, MD;  Location: [facility];  Service: Pulmonary;  Laterality: N/A;    REVIEW OF SYSTEMS:   Review of Systems  Constitutional: Negative for appetite change, chills, fatigue, fever and unexpected weight change.  HENT:   Negative for mouth sores, nosebleeds, sore throat and trouble swallowing.   Eyes: Negative for eye problems and icterus.  Respiratory: Negative for cough,  hemoptysis, shortness of breath and wheezing.   Cardiovascular: Negative for chest pain and leg swelling.  Gastrointestinal: Negative for abdominal pain, constipation, diarrhea, nausea and vomiting.  Genitourinary: Negative for bladder incontinence, difficulty urinating, dysuria, frequency and hematuria.   Musculoskeletal: Negative for back pain, gait problem, neck pain and neck stiffness.  Skin: Negative for itching and rash.  Neurological: Negative for dizziness, extremity weakness, gait problem, headaches, light-headedness and seizures.  Hematological: Negative for adenopathy. Does not bruise/bleed easily.  Psychiatric/Behavioral: Negative for confusion, depression and sleep disturbance. The patient is not nervous/anxious.     PHYSICAL EXAMINATION:  There were no vitals taken for this visit.  ECOG PERFORMANCE STATUS: {CHL ONC ECOG [id number]  Physical Exam  Constitutional: Oriented to person, place, and time and well-developed, well-nourished, and in no distress. No distress.  HENT:  Head: Normocephalic and atraumatic.  Mouth/Throat: Oropharynx is clear and moist. No oropharyngeal exudate.  Eyes: Conjunctivae are normal. Right eye exhibits no discharge. Left eye exhibits no discharge. No scleral icterus.  Neck: Normal range of motion. Neck supple.  Cardiovascular: Normal rate,  regular rhythm, normal heart sounds and intact distal pulses.   Pulmonary/Chest: Effort normal and breath sounds normal. No respiratory distress. No wheezes. No rales.  Abdominal: Soft. Bowel sounds are normal. Exhibits no distension and no mass. There is no tenderness.  Musculoskeletal: Normal range of motion. Exhibits no edema.  Lymphadenopathy:    No cervical adenopathy.  Neurological: Alert and oriented to person, place, and time. Exhibits normal muscle tone. Gait normal. Coordination normal.  Skin: Skin is warm and dry. No rash noted. Not diaphoretic. No erythema. No pallor.  Psychiatric: Mood, memory  and judgment normal.  Vitals reviewed.  LABORATORY DATA: Lab Results  Component Value Date   WBC 9.4 09/13/2023   HGB 12.9 (L) 09/13/2023   HCT 38.2 (L) 09/13/2023   MCV 88.2 09/13/2023   PLT 473 (H) 09/13/2023      Chemistry      Component Value Date/Time   NA 136 09/19/2023 1604   NA 143 09/05/2023 1015   K 3.8 09/19/2023 1604   CL 102 09/19/2023 1604   CO2 22 09/19/2023 1604   BUN 16 09/19/2023 1604   BUN 16 09/05/2023 1015   CREATININE 0.81 09/19/2023 1604   CREATININE 0.72 05/16/2023 1028      Component Value Date/Time   CALCIUM  9.0 09/19/2023 1604   ALKPHOS 55 09/19/2023 1604   AST 19 09/19/2023 1604   AST 19 05/16/2023 1028   ALT 18 09/19/2023 1604   ALT 17 05/16/2023 1028   BILITOT 0.5 09/19/2023 1604   BILITOT 0.6 05/16/2023 1028       RADIOGRAPHIC STUDIES:  CT CHEST ABDOMEN PELVIS W CONTRAST Result Date: 09/16/2023 CLINICAL DATA:  Non-small cell lung cancer restaging * Tracking Code: BO * EXAM: CT CHEST, ABDOMEN, AND PELVIS WITH CONTRAST TECHNIQUE: Multidetector CT imaging of the chest, abdomen and pelvis was performed following the standard protocol during bolus administration of intravenous contrast. RADIATION DOSE REDUCTION: This exam was performed according to the departmental dose-optimization program which includes automated exposure control, adjustment of the mA and/or kV according to patient size and/or use of iterative reconstruction technique. CONTRAST:  OMNIPAQUE  IOHEXOL  300 MG/ML  SOLN COMPARISON:  03/25/2023 FINDINGS: CT CHEST FINDINGS Cardiovascular: Right Port-A-Cath tip: Right atrium. Atheromatous intimal thickening in portions of the aortic arch and brachiocephalic artery. Mild left anterior descending and circumflex coronary artery atheromatous vascular calcification. Mediastinum/Nodes: 0.8 cm left supraclavicular lymph node, stable. No overt pathologic adenopathy currently identified. Lungs/Pleura: Centrilobular emphysema. Mild scarring  medially in the right middle lobe. Subsegmental atelectasis or scarring in the posterior basal segments of both lower lobes. Suspected post therapy related findings along the left hilum. Musculoskeletal: Mild degenerative findings in the thoracic spine. CT ABDOMEN PELVIS FINDINGS Hepatobiliary: Contracted gallbladder. Accentuated density along the gallbladder lumen likely from small gallstones. No significant hepatic parenchymal lesion identified. Pancreas: Unremarkable Spleen: Unremarkable Adrenals/Urinary Tract: Unremarkable Stomach/Bowel: Sigmoid colon diverticulosis. Vascular/Lymphatic: A lymph node to the right of the IVC measures 1.0 cm in short axis on image 75 series 2, borderline prominent and stable. Porta hepatis/peripancreatic node mildly enlarged at 1.1 cm on image 67 series 2, formerly the same. Portacaval node 1.6 cm in short axis on image 67 series 2, previously the same. Aortoiliac atheromatous vascular calcifications. Reproductive: Unremarkable Other: No supplemental non-categorized findings. Musculoskeletal: Bilateral foraminal impingement at L4-5 due to spondylosis and degenerative disc disease. IMPRESSION: 1. Stable CT appearance of the chest, abdomen, and pelvis. Stable mildly enlarged porta hepatis/peripancreatic and portacaval lymph nodes., more likely to be reactive  than a manifestation of malignancy. 2. Cholelithiasis. 3. Sigmoid colon diverticulosis. 4. Bilateral foraminal impingement at L4-5 due to spondylosis and degenerative disc disease. 5. Aortic Atherosclerosis (ICD10-I70.0) and Emphysema (ICD10-J43.9). Coronary atherosclerosis. Electronically Signed   By: Freida Jes M.D.   On: 09/16/2023 16:12     ASSESSMENT/PLAN:  This is a very pleasant 68 year old African-American male diagnosed with stage IV (Tx, N2, M1c) non-small cell lung cancer, adenocarcinoma.  He presented with a solitary brain metastasis in addition to right hilar and mediastinal lymphadenopathy.  He was  diagnosed in August 2021.     The patient is status post right craniotomy with resection of the solitary brain metastasis with SRS under the care of Dr. Jeryl Moris and Dr. Michale Age.    He completed weekly concurrent chemoradiation with carboplatin  for an AUC of 2 and paclitaxel  45 mg per metered square.  He is status post 5 cycles.  He tolerated it well except for some dysphagia/odynophagia.   The patient then had evidence of new brain metastases.  He is status post SRS to the new lesions on 04/22/2020 under the care of Dr. Jeryl Moris.  The patient recently completed immunotherapy with Libtayo  IV every 3 weeks.  He is status post 35 cycles and tolerated it well except for arthralgias.  His last dose of treatment was on 04/10/2022.   The patient recently had a restaging CT scan performed.  Dr. Marguerita Shih personally independently reviewed the scan and discussed the results with the patient today.  The scan did not show any evidence of disease progression.  Dr. Marguerita Shih recommends the patient continue on observation with a restaging CT scan in 6 months.   We will see him back for follow-up visit at that time to review his scan results.   I will arrange for his port-a-cath to be flushed in 6 weeks.    He will continue to follow with Dr. Mark Sil regarding his history of metastatic disease to the brain. His next brain MRI is due in October 2025.   The patient was advised to call immediately if he has any concerning symptoms in the interval. The patient voices understanding of current disease status and treatment options and is in agreement with the current care plan. All questions were answered. The patient knows to call the clinic with any problems, questions or concerns. We can certainly see the patient much sooner if necessary       No orders of the defined types were placed in this encounter.    I spent {CHL ONC TIME VISIT - ZOXWR:[id number]} counseling the patient face to face. The total time spent in  the appointment was {CHL ONC TIME VISIT - BJYNW:[id number]}.  Ketura Sirek L Sorren Vallier, PA-C 09/22/23

## 2023-09-24 ENCOUNTER — Other Ambulatory Visit

## 2023-09-24 ENCOUNTER — Encounter: Payer: Self-pay | Admitting: Gastroenterology

## 2023-09-24 ENCOUNTER — Ambulatory Visit (INDEPENDENT_AMBULATORY_CARE_PROVIDER_SITE_OTHER): Admitting: Gastroenterology

## 2023-09-24 VITALS — BP 150/60 | HR 72 | Ht 70.0 in | Wt 180.2 lb

## 2023-09-24 DIAGNOSIS — C3491 Malignant neoplasm of unspecified part of right bronchus or lung: Secondary | ICD-10-CM

## 2023-09-24 DIAGNOSIS — B1710 Acute hepatitis C without hepatic coma: Secondary | ICD-10-CM | POA: Diagnosis not present

## 2023-09-24 NOTE — Patient Instructions (Addendum)
 _______________________________________________________  If your blood pressure at your visit was 140/90 or greater, please contact your primary care physician to follow up on this. _______________________________________________________  If you are age 70 or older, your body mass index should be between 23-30. Your Body mass index is 25.86 kg/m. If this is out of the aforementioned range listed, please consider follow up with your Primary Care Provider. ________________________________________________________  The [facility] providers would like to encourage you to use MYCHART to communicate with providers for non-urgent requests or questions.  Due to long hold times on the telephone, sending your provider a message by [facility] may be a faster and more efficient way to get a response.  Please allow 48 business hours for a response.  Please remember that this is for non-urgent requests.  _______________________________________________________  Your provider has requested that you go to the basement level for lab work before leaving today. Press "B" on the elevator. The lab is located at the first door on the left as you exit the elevator.  Due to recent changes in healthcare laws, you may see the results of your imaging and laboratory studies on MyChart before your provider has had a chance to review them.  We understand that in some cases there may be results that are confusing or concerning to you. Not all laboratory results come back in the same time frame and the provider may be waiting for multiple results in order to interpret others.  Please give us  48 hours in order for your provider to thoroughly review all the results before contacting the office for clarification of your results.   Thank you for entrusting me with your care and choosing [facility].  Dr Venice Gillis

## 2023-09-24 NOTE — Progress Notes (Signed)
 Chief Complaint: Hep C  Referring Provider:  Jearldine Mina, DO      ASSESSMENT AND PLAN;   #1. Hep C with Nl Lfts. No cirrhosis on CT 08/2023. Viral load 1.60M  #2. Stage IV NSCLC with brain mets s/p Sx/chemo.  Currently in remission   Plan: -Ref to ID for Hep C Rx -Acute hep panel. Hep C genotype. -Check HBsAb titer and HAV total Ab.  If neg, would recommend vaccination for A and B. -Discussed regarding screening colonoscopy.  He wants to hold off.    HPI:    Jared Shea. is a 68 y.o. male  NSCLC stage IV with brain mets causing SZ on Keppra  (followed by Dr Mark Sil), DM2  Here for hepatitis C-incidental diagnosis. No IVDA/tattoos  No nausea, vomiting, heartburn, regurgitation, odynophagia or dysphagia.  No significant diarrhea or constipation.  No melena or hematochezia. No unintentional weight loss. No abdominal pain.  Wants to hold off on colonoscopy.  He does understand risks and benefits.  Discussed the use of AI scribe software for clinical note transcription with the patient, who gave verbal consent to proceed.  History of Present Illness He has been diagnosed with hepatitis C, confirmed by positive antibodies and a viral load. No history of IV drug use or tattoos. He is aware of having gallstones, identified on a recent CT scan of the chest, abdomen, and pelvis performed on September 10, 2023.        Previous workup: CT CAP 09/10/2023 1. Stable CT appearance of the chest, abdomen, and pelvis. Stable mildly enlarged porta hepatis/peripancreatic and portacaval lymph nodes., more likely to be reactive than a manifestation of malignancy. 2. Cholelithiasis. 3. Sigmoid colon diverticulosis. 4. Bilateral foraminal impingement at L4-5 due to spondylosis and degenerative disc disease. 5. Aortic Atherosclerosis (ICD10-I70.0) and Emphysema (ICD10-J43.9). Coronary atherosclerosis.    Past Medical History:  Diagnosis Date   Brain mass    brain cancer    Bursitis of right hip    Chest pain 08/12/2018   Chronic cough    Dependence on nicotine  from cigarettes    Diabetes mellitus without complication (HCC)    Dysuria 04/03/2021   Essential hypertension 07/28/2018   Frequent headaches 06/27/2020   Metastatic lung cancer (metastasis from lung to other site) [facility])    nscl ca dx'd 12/2019   Polyarthralgia 06/15/2020   Seizures (HCC)    Viral illness 03/23/2019    Past Surgical History:  Procedure Laterality Date   APPLICATION OF CRANIAL NAVIGATION N/A 01/07/2020   Procedure: APPLICATION OF CRANIAL NAVIGATION;  Surgeon: Audie Bleacher, MD;  Location: [facility] OR;  Service: Neurosurgery;  Laterality: N/A;   CRANIOTOMY Right 01/07/2020   Procedure: RIGHT CRANIOTOMY FOR TUMOR RESECTION;  Surgeon: Audie Bleacher, MD;  Location: [facility] OR;  Service: Neurosurgery;  Laterality: Right;  rm 21   ENDOBRONCHIAL ULTRASOUND N/A 12/21/2019   Procedure: ENDOBRONCHIAL ULTRASOUND;  Surgeon: Margaretann Sharper, MD;  Location: [facility] ENDOSCOPY;  Service: Pulmonary;  Laterality: N/A;   FINE NEEDLE ASPIRATION  12/21/2019   Procedure: FINE NEEDLE ASPIRATION (FNA) LINEAR;  Surgeon: Margaretann Sharper, MD;  Location: [facility] ENDOSCOPY;  Service: Pulmonary;;   IR IMAGING GUIDED PORT INSERTION  03/03/2020   VIDEO BRONCHOSCOPY N/A 12/21/2019   Procedure: VIDEO BRONCHOSCOPY WITHOUT FLUORO;  Surgeon: Margaretann Sharper, MD;  Location: [facility] ENDOSCOPY;  Service: Pulmonary;  Laterality: N/A;    Family History  Problem Relation Age of Onset   Heart disease Mother  CABG   Hypertension Mother    Kidney disease Sister    Hypertension Sister    Diabetes Brother    Heart attack Brother    Kidney disease Brother        KIDNEY TRANSPLANT   Healthy Daughter    Healthy Daughter    Healthy Son     Social History   Tobacco Use   Smoking status: Former    Current packs/day: 0.00    Average packs/day: 1 pack/day for 49.4 years (49.4 ttl pk-yrs)    Types: Cigarettes    Start date:  08/07/1970    Quit date: 12/15/2019    Years since quitting: 3.7   Smokeless tobacco: Never  Vaping Use   Vaping status: Never Used  Substance Use Topics   Alcohol use: Not Currently   Drug use: Never    Current Outpatient Medications  Medication Sig Dispense Refill   aspirin  EC 81 MG tablet Take 1 tablet (81 mg total) by mouth daily. Swallow whole.     isosorbide  mononitrate (IMDUR ) 60 MG 24 hr tablet Take 60 mg by mouth daily.     levETIRAcetam  (KEPPRA ) 750 MG tablet TAKE 1 TABLET(750 MG) BY MOUTH TWICE DAILY 180 tablet 4   losartan -hydrochlorothiazide  (HYZAAR) 50-12.5 MG tablet Take 1 tablet by mouth daily. (Patient taking differently: Take 0.5 tablets by mouth daily.) 90 tablet 3   melatonin 5 MG TABS Take 1 tablet (5 mg total) by mouth at bedtime as needed (for sleep). 30 tablet 0   metFORMIN  (GLUCOPHAGE ) 500 MG tablet TAKE 1 TABLET(500 MG) BY MOUTH TWICE DAILY WITH A MEAL 120 tablet 2   metoprolol  succinate (TOPROL  XL) 25 MG 24 hr tablet Take 0.5 tablets (12.5 mg total) by mouth at bedtime. 90 tablet 3   nortriptyline  (PAMELOR ) 25 MG capsule Take 25 mg by mouth 2 (two) times daily.     atorvastatin  (LIPITOR) 20 MG tablet Take 1 tablet (20 mg total) by mouth daily. (Patient not taking: Reported on 09/24/2023) 90 tablet 3   No current facility-administered medications for this visit.    Allergies  Allergen Reactions   Jardiance  [Empagliflozin ]     Caused diarrhea & itching, so we stopped   Penicillins Rash    Did it involve swelling of the face/tongue/throat, SOB, or low BP? N Did it involve sudden or severe rash/hives, skin peeling, or any reaction on the inside of your mouth or nose? Y Did you need to seek medical attention at a hospital or doctor's office? Y When did it last happen?    childhood   If all above answers are "NO", may proceed with cephalosporin use.     Review of Systems:  neg     Physical Exam:    BP (!) 150/80 (BP Location: Left Arm, Patient Position:  Sitting, Cuff Size: Normal)   Pulse 72   Ht 5\' 10"  (1.778 m) Comment: height measured without shoes  Wt 180 lb 4 oz (81.8 kg)   BMI 25.86 kg/m  Wt Readings from Last 3 Encounters:  09/24/23 180 lb 4 oz (81.8 kg)  09/19/23 188 lb 6.4 oz (85.5 kg)  09/14/23 187 lb 3.2 oz (84.9 kg)   Constitutional:  Well-developed, in no acute distress. Psychiatric: Normal mood and affect. Behavior is normal. HEENT: Pupils normal.  Conjunctivae are normal. No scleral icterus. Cardiovascular: Normal rate, regular rhythm. No edema Pulmonary/chest: Effort normal and breath sounds normal. No wheezing, rales or rhonchi. Abdominal: Soft, nondistended. Nontender. Bowel sounds active throughout.  There are no masses palpable. No hepatomegaly. Rectal: Deferred Neurological: Alert and oriented to person place and time. Skin: Skin is warm and dry. No rashes noted.  Data Reviewed: I have personally reviewed following labs and imaging studies  CBC:    Latest Ref Rng & Units 09/13/2023    7:25 PM 05/16/2023   10:28 AM 03/25/2023   10:42 AM  CBC  WBC 4.0 - 10.5 K/uL 9.4  7.0  7.0   Hemoglobin 13.0 - 17.0 g/dL 82.9  56.2  13.0   Hematocrit 39.0 - 52.0 % 38.2  34.1  35.8   Platelets 150 - 400 K/uL 473  390  301     CMP:    Latest Ref Rng & Units 09/19/2023    4:04 PM 09/13/2023    7:25 PM 09/05/2023   10:15 AM  CMP  Glucose 70 - 99 mg/dL 865  784  696   BUN 8 - 23 mg/dL 16  21  16    Creatinine 0.61 - 1.24 mg/dL 2.95  2.84  1.32   Sodium 135 - 145 mmol/L 136  136  143   Potassium 3.5 - 5.1 mmol/L 3.8  3.5  4.9   Chloride 98 - 111 mmol/L 102  101  105   CO2 22 - 32 mmol/L 22  22  20    Calcium  8.9 - 10.3 mg/dL 9.0  9.1  9.2   Total Protein 6.5 - 8.1 g/dL 8.0  7.4    Total Bilirubin 0.0 - 1.2 mg/dL 0.5  0.6    Alkaline Phos 38 - 126 U/L 55  55    AST 15 - 41 U/L 19  19    ALT 0 - 44 U/L 18  15      GFR: Estimated Creatinine Clearance: 88.9 mL/min (by C-G formula based on SCr of 0.81 mg/dL). Liver Function  Tests: Recent Labs  Lab 09/19/23 1604  AST 19  ALT 18  ALKPHOS 55  BILITOT 0.5  PROT 8.0  ALBUMIN 4.5      Radiology Studies: CT CHEST ABDOMEN PELVIS W CONTRAST Result Date: 09/16/2023 CLINICAL DATA:  Non-small cell lung cancer restaging * Tracking Code: BO * EXAM: CT CHEST, ABDOMEN, AND PELVIS WITH CONTRAST TECHNIQUE: Multidetector CT imaging of the chest, abdomen and pelvis was performed following the standard protocol during bolus administration of intravenous contrast. RADIATION DOSE REDUCTION: This exam was performed according to the departmental dose-optimization program which includes automated exposure control, adjustment of the mA and/or kV according to patient size and/or use of iterative reconstruction technique. CONTRAST:  OMNIPAQUE  IOHEXOL  300 MG/ML  SOLN COMPARISON:  03/25/2023 FINDINGS: CT CHEST FINDINGS Cardiovascular: Right Port-A-Cath tip: Right atrium. Atheromatous intimal thickening in portions of the aortic arch and brachiocephalic artery. Mild left anterior descending and circumflex coronary artery atheromatous vascular calcification. Mediastinum/Nodes: 0.8 cm left supraclavicular lymph node, stable. No overt pathologic adenopathy currently identified. Lungs/Pleura: Centrilobular emphysema. Mild scarring medially in the right middle lobe. Subsegmental atelectasis or scarring in the posterior basal segments of both lower lobes. Suspected post therapy related findings along the left hilum. Musculoskeletal: Mild degenerative findings in the thoracic spine. CT ABDOMEN PELVIS FINDINGS Hepatobiliary: Contracted gallbladder. Accentuated density along the gallbladder lumen likely from small gallstones. No significant hepatic parenchymal lesion identified. Pancreas: Unremarkable Spleen: Unremarkable Adrenals/Urinary Tract: Unremarkable Stomach/Bowel: Sigmoid colon diverticulosis. Vascular/Lymphatic: A lymph node to the right of the IVC measures 1.0 cm in short axis on image 75  series 2, borderline prominent and stable. Porta  hepatis/peripancreatic node mildly enlarged at 1.1 cm on image 67 series 2, formerly the same. Portacaval node 1.6 cm in short axis on image 67 series 2, previously the same. Aortoiliac atheromatous vascular calcifications. Reproductive: Unremarkable Other: No supplemental non-categorized findings. Musculoskeletal: Bilateral foraminal impingement at L4-5 due to spondylosis and degenerative disc disease. IMPRESSION: 1. Stable CT appearance of the chest, abdomen, and pelvis. Stable mildly enlarged porta hepatis/peripancreatic and portacaval lymph nodes., more likely to be reactive than a manifestation of malignancy. 2. Cholelithiasis. 3. Sigmoid colon diverticulosis. 4. Bilateral foraminal impingement at L4-5 due to spondylosis and degenerative disc disease. 5. Aortic Atherosclerosis (ICD10-I70.0) and Emphysema (ICD10-J43.9). Coronary atherosclerosis. Electronically Signed   By: Freida Jes M.D.   On: 09/16/2023 16:12      Magnus Schuller, MD 09/24/2023, 9:49 AM  Cc: Jearldine Mina, DO

## 2023-09-25 LAB — HCV RNA QUANT
HCV log10: 6.201 {Log_IU}/mL
Hepatitis C Quantitation: 1590000 [IU]/mL

## 2023-09-26 ENCOUNTER — Ambulatory Visit: Payer: Self-pay | Admitting: Gastroenterology

## 2023-09-26 ENCOUNTER — Inpatient Hospital Stay (HOSPITAL_BASED_OUTPATIENT_CLINIC_OR_DEPARTMENT_OTHER): Admitting: Physician Assistant

## 2023-09-26 VITALS — BP 150/80 | HR 77 | Temp 97.5°F | Resp 14 | Wt 189.2 lb

## 2023-09-26 DIAGNOSIS — C3491 Malignant neoplasm of unspecified part of right bronchus or lung: Secondary | ICD-10-CM

## 2023-09-26 DIAGNOSIS — C3490 Malignant neoplasm of unspecified part of unspecified bronchus or lung: Secondary | ICD-10-CM | POA: Diagnosis not present

## 2023-09-26 DIAGNOSIS — C7931 Secondary malignant neoplasm of brain: Secondary | ICD-10-CM | POA: Diagnosis not present

## 2023-09-26 DIAGNOSIS — B1920 Unspecified viral hepatitis C without hepatic coma: Secondary | ICD-10-CM | POA: Diagnosis not present

## 2023-09-27 ENCOUNTER — Other Ambulatory Visit: Payer: Self-pay

## 2023-09-27 DIAGNOSIS — B1710 Acute hepatitis C without hepatic coma: Secondary | ICD-10-CM

## 2023-09-27 LAB — HEPATITIS B SURFACE ANTIBODY,QUALITATIVE: Hep B S Ab: NONREACTIVE

## 2023-09-27 LAB — HEPATITIS C RNA QUANTITATIVE
HCV Quantitative Log: 5.9 {Log_IU}/mL — ABNORMAL HIGH
HCV RNA, PCR, QN: 803000 [IU]/mL — ABNORMAL HIGH

## 2023-09-27 LAB — HEPATITIS C ANTIBODY: Hepatitis C Ab: REACTIVE — AB

## 2023-09-27 LAB — HEPATITIS A ANTIBODY, IGM: Hep A IgM: NONREACTIVE

## 2023-09-27 LAB — HEPATITIS B CORE ANTIBODY, TOTAL: Hep B Core Total Ab: NONREACTIVE

## 2023-09-27 LAB — HEPATITIS A ANTIBODY, TOTAL: Hepatitis A AB,Total: REACTIVE — AB

## 2023-09-27 LAB — HEPATITIS B SURFACE ANTIGEN: Hepatitis B Surface Ag: NONREACTIVE

## 2023-09-30 ENCOUNTER — Telehealth: Payer: Self-pay | Admitting: *Deleted

## 2023-09-30 NOTE — Telephone Encounter (Signed)
 Will forward to PCP. Copied from CRM [id number]. Topic: Clinical - Request for Lab/Test Order >> Sep 27, 2023  8:39 AM Danelle Dunning F wrote: Reason for CRM:   Patient's sister, Thompson Flight, called in and stated that the patient asked her to call over to the patient PCP office and inform them that per his Gastroenterologist he is due for his Hepatitis C injection.   Please assist patient with placing order and calling him back to schedule.   Callback number:   Thompson Flight [phone] (direct line at work)  Her cell Home number is: [phone]

## 2023-10-02 NOTE — Telephone Encounter (Signed)
 RTC from patient's sister informed her that patient has an appointment on 10/08/2023 and the need for the Hep A and B and Hep C can be discussed at that visit. Sister agreed .

## 2023-10-02 NOTE — Telephone Encounter (Signed)
 RTC to patient's sister on both lines.  Message to call Clinics to discuss Hep C immunization.  Patient has an upcoming appointment with Dr. Lydia Sams on 10/08/2023.

## 2023-10-03 ENCOUNTER — Inpatient Hospital Stay: Payer: Medicare HMO

## 2023-10-08 ENCOUNTER — Ambulatory Visit (INDEPENDENT_AMBULATORY_CARE_PROVIDER_SITE_OTHER): Admitting: Student

## 2023-10-08 ENCOUNTER — Encounter: Payer: Self-pay | Admitting: Student

## 2023-10-08 VITALS — BP 135/70 | HR 71 | Temp 97.7°F | Ht 70.0 in | Wt 188.4 lb

## 2023-10-08 DIAGNOSIS — Z23 Encounter for immunization: Secondary | ICD-10-CM | POA: Diagnosis not present

## 2023-10-08 DIAGNOSIS — B1920 Unspecified viral hepatitis C without hepatic coma: Secondary | ICD-10-CM | POA: Diagnosis not present

## 2023-10-08 DIAGNOSIS — I10 Essential (primary) hypertension: Secondary | ICD-10-CM

## 2023-10-08 NOTE — Assessment & Plan Note (Signed)
 Patient tested positive for hepatitis C during screening earlier this month.  He has been referred to [facility] for treatment.  He did see gastroenterology who recommended patient get vaccinated for hepatitis A and B.  Looking at labs, it looks like he has immunity to hepatitis A, but not B.  Will start hepatitis B vaccination schedule.  Plan: - Patient has appointment on 10/16/23 for Hep C treatment  - First dose of Hep B immunization given, next one is due in June 2025, followed by November 2025

## 2023-10-08 NOTE — Progress Notes (Signed)
 CC: Orthostatic hypotension follow-up and hepatitis C follow-up  HPI:  Mr.Asaiah Herndon Grill. is a 68 y.o. male with a past medical history of hypertension, diabetes, hyperlipidemia who presents for follow-up appointment.  Please see assessment and plan for full HPI.  Medications: CAD: Lipitor 20 mg daily, aspirin  81 mg daily HFrecEF: Imdur  60 mg daily, losartan -HCTZ 50-12.5 mg half tablet daily, metoprolol  succinate 12.5 mg nightly Hypertension: Losartan -HCTZ 50-12.5 mg half tablet daily, metoprolol  succinate 12.5 mg nightly, Imdur  60 mg daily Seizures: Nortriptyline  25 mg twice daily, Keppra  750 mg BID   Past Medical History:  Diagnosis Date   Brain mass    brain cancer   Bursitis of right hip    Chest pain 08/12/2018   Chronic cough    Dependence on nicotine  from cigarettes    Diabetes mellitus without complication (HCC)    Dysuria 04/03/2021   Essential hypertension 07/28/2018   Frequent headaches 06/27/2020   Metastatic lung cancer (metastasis from lung to other site) [facility])    nscl ca dx'd 12/2019   Polyarthralgia 06/15/2020   Seizures (HCC)    Viral illness 03/23/2019     Current Outpatient Medications:    aspirin  EC 81 MG tablet, Take 1 tablet (81 mg total) by mouth daily. Swallow whole., Disp: , Rfl:    atorvastatin  (LIPITOR) 20 MG tablet, Take 1 tablet (20 mg total) by mouth daily. (Patient not taking: Reported on 09/24/2023), Disp: 90 tablet, Rfl: 3   isosorbide  mononitrate (IMDUR ) 60 MG 24 hr tablet, Take 60 mg by mouth daily., Disp: , Rfl:    levETIRAcetam  (KEPPRA ) 750 MG tablet, TAKE 1 TABLET(750 MG) BY MOUTH TWICE DAILY, Disp: 180 tablet, Rfl: 4   losartan -hydrochlorothiazide  (HYZAAR) 50-12.5 MG tablet, Take 1 tablet by mouth daily. (Patient taking differently: Take 0.5 tablets by mouth daily.), Disp: 90 tablet, Rfl: 3   melatonin 5 MG TABS, Take 1 tablet (5 mg total) by mouth at bedtime as needed (for sleep)., Disp: 30 tablet, Rfl: 0   metFORMIN  (GLUCOPHAGE ) 500 MG  tablet, TAKE 1 TABLET(500 MG) BY MOUTH TWICE DAILY WITH A MEAL, Disp: 120 tablet, Rfl: 2   metoprolol  succinate (TOPROL  XL) 25 MG 24 hr tablet, Take 0.5 tablets (12.5 mg total) by mouth at bedtime., Disp: 90 tablet, Rfl: 3   nortriptyline  (PAMELOR ) 25 MG capsule, Take 25 mg by mouth 2 (two) times daily., Disp: , Rfl:   Review of Systems:    Negative except for what is stated in HPI  Physical Exam:  Vitals:   10/08/23 0936  BP: 135/70  Pulse: 71  Temp: 97.7 F (36.5 C)  TempSrc: Oral  SpO2: 98%  Weight: 188 lb 6.4 oz (85.5 kg)  Height: 5\' 10"  (1.778 m)   General: Patient is sitting comfortably in the room  Head: Normocephalic, atraumatic  Cardio: Regular rate and rhythm, no murmurs, rubs or gallops Pulmonary: Clear to ausculation bilaterally with no rales, rhonchi, and crackles    Assessment & Plan:   Essential hypertension Patient has a past medical history of hypertension.  He was seen about 2 weeks ago for concerns of orthostatic hypotension.  At that time he was instructed to stop taking his losartan -HCTZ and cut his metoprolol  in half.  He however does not know which medications he is taking.  He does not know which medications he is cutting in half and which ones he is taking full tablets of.  He does not recall the names of his medications.  He did not bring  his medications today to his appointment.  He however states his lightheadedness is improved.  He has no concerns about this anymore.  I did instruct him to bring his medications at his next appointment.  He states he will come back later today to show me his medications.  I told him this will be okay.  Counseled him heavily on the importance of bringing his medications.  Plan: - Continue metoprolol  succinate 12.5 mg nightly  - Continue losartan -HCTZ 50-12.5 mg half tablet daily  - Continue Imdur  60 mg daily - Important counseling provided to patient regarding medications  Hepatitis C Patient tested positive for  hepatitis C during screening earlier this month.  He has been referred to [facility] for treatment.  He did see gastroenterology who recommended patient get vaccinated for hepatitis A and B.  Looking at labs, it looks like he has immunity to hepatitis A, but not B.  Will start hepatitis B vaccination schedule.  Plan: - Patient has appointment on 10/16/23 for Hep C treatment  - First dose of Hep B immunization given, next one is due in June 2025, followed by November 2025  Patient discussed with Dr. Teola Felling, DO PGY-2 Internal Medicine Resident

## 2023-10-08 NOTE — Addendum Note (Signed)
 Addended by: Lawson Prey on: 10/08/2023 01:00 PM   Modules accepted: Orders

## 2023-10-08 NOTE — Patient Instructions (Addendum)
 Jared Bunda Jr.,Thank you for allowing me to take part in your care today.  Here are your instructions.  1. You are going to get your Hep B Vaccine today. Get your next  one in 1 month and then get your last one in 6 months.  2. Please come back in 4 weeks, and please bring your medications     Thank you, Dr. Lydia Sams  If you have any other questions please contact the internal medicine clinic at [phone] If it is after hours, please call the [facility] at [phone] and then ask the person who picks up for the resident on call.

## 2023-10-08 NOTE — Assessment & Plan Note (Signed)
 Patient has a past medical history of hypertension.  He was seen about 2 weeks ago for concerns of orthostatic hypotension.  At that time he was instructed to stop taking his losartan -HCTZ and cut his metoprolol  in half.  He however does not know which medications he is taking.  He does not know which medications he is cutting in half and which ones he is taking full tablets of.  He does not recall the names of his medications.  He did not bring his medications today to his appointment.  He however states his lightheadedness is improved.  He has no concerns about this anymore.  I did instruct him to bring his medications at his next appointment.  He states he will come back later today to show me his medications.  I told him this will be okay.  Counseled him heavily on the importance of bringing his medications.  Plan: - Continue metoprolol  succinate 12.5 mg nightly  - Continue losartan -HCTZ 50-12.5 mg half tablet daily  - Continue Imdur  60 mg daily - Important counseling provided to patient regarding medications

## 2023-10-10 ENCOUNTER — Inpatient Hospital Stay: Payer: Medicare HMO | Admitting: Physician Assistant

## 2023-10-10 NOTE — Progress Notes (Signed)
 Internal Medicine Clinic Attending  Case discussed with the resident at the time of the visit.  We reviewed the resident's history and exam and pertinent patient test results.  I agree with the assessment, diagnosis, and plan of care documented in the resident's note.

## 2023-10-11 ENCOUNTER — Other Ambulatory Visit (HOSPITAL_COMMUNITY): Payer: Self-pay

## 2023-10-11 ENCOUNTER — Encounter: Payer: Self-pay | Admitting: Internal Medicine

## 2023-10-11 ENCOUNTER — Telehealth: Payer: Self-pay

## 2023-10-11 NOTE — Telephone Encounter (Signed)
 Pharmacy Patient Advocate Encounter  Insurance verification completed.   The patient is insured through [employer]   Ran test claim for [employer]. Currently a quantity of 84 is a 28 day supply and is non-formulary . Patient will have to try Epclusa , Vosevi   This test claim was processed through [facility]- copay amounts may vary at other pharmacies due to pharmacy/plan contracts, or as the patient moves through the different stages of their insurance plan.

## 2023-10-14 NOTE — Progress Notes (Signed)
 Internal Medicine Clinic Attending  Case discussed with the resident at the time of the visit.  We reviewed the resident's history and exam and pertinent patient test results.  I agree with the assessment, diagnosis, and plan of care documented in the resident's note.

## 2023-10-16 ENCOUNTER — Encounter: Admitting: Infectious Diseases

## 2023-10-29 ENCOUNTER — Ambulatory Visit: Admitting: Infectious Diseases

## 2023-10-30 ENCOUNTER — Other Ambulatory Visit: Payer: Self-pay | Admitting: Infectious Diseases

## 2023-10-30 ENCOUNTER — Other Ambulatory Visit (HOSPITAL_COMMUNITY): Payer: Self-pay

## 2023-10-30 ENCOUNTER — Ambulatory Visit: Admitting: Infectious Diseases

## 2023-10-30 VITALS — BP 173/83 | HR 73 | Temp 98.4°F | Ht 70.0 in | Wt 186.6 lb

## 2023-10-30 DIAGNOSIS — B1920 Unspecified viral hepatitis C without hepatic coma: Secondary | ICD-10-CM

## 2023-10-30 DIAGNOSIS — Z23 Encounter for immunization: Secondary | ICD-10-CM | POA: Diagnosis not present

## 2023-10-30 MED ORDER — MAVYRET 100-40 MG PO TABS: 1.0000 | ORAL_TABLET | Freq: Every day | ORAL | 0 refills | Status: DC

## 2023-10-30 MED ORDER — SOFOSBUVIR-VELPATASVIR 400-100 MG PO TABS: 1.0000 | ORAL_TABLET | Freq: Every day | ORAL | 0 refills | Status: DC

## 2023-10-30 NOTE — Progress Notes (Signed)
   Subjective:    Patient ID: Jared Shea., male  DOB: [birth year], 68 y.o.        MRN: [id number]   HPI 68 yo M with hx of metastatic ca of lung (brain), DM2 on metformin , and newly discovered Hep C (vl 1.8 million 09-10-23).  He is Hep A immune, Hep B negative.  His LFTs are normal. INR normal 2021.  He has had f/u with onc and completed his CTX and XRT ~ 1.5 year ago He states this his cancer is stable.  He denies IVDA, no tattoos.  No icterus, no pale stools. Denies abd pain.   Please see HPI. All other systems reviewed and negative.   Health Maintenance  Topic Date Due   DTaP/Tdap/Td (1 - Tdap) Never done   Zoster Vaccines- Shingrix (1 of 2) Never done   COVID-19 Vaccine (3 - Pfizer risk series) 10/08/2019   INFLUENZA VACCINE  12/13/2023   Diabetic kidney evaluation - Urine ACR  02/08/2024   HEMOGLOBIN A1C  03/06/2024   OPHTHALMOLOGY EXAM  03/12/2024   Medicare Annual Wellness (AWV)  04/23/2024   Diabetic kidney evaluation - eGFR measurement  09/18/2024   Pneumococcal Vaccine: 50+ Years  Completed   Hepatitis C Screening  Completed   HPV VACCINES  Aged Out   Meningococcal B Vaccine  Aged Out   FOOT EXAM  Discontinued   Colonoscopy  Discontinued    Review of Systems  Constitutional:  Positive for weight loss. Negative for chills and fever.  HENT:  Negative for nosebleeds.   Eyes:  Positive for blurred vision (has not had recent ophtho).  Respiratory:  Positive for cough. Negative for sputum production and shortness of breath.   Cardiovascular:  Positive for chest pain (intermittent, relieved with relaxation).  Gastrointestinal:  Positive for diarrhea. Negative for abdominal pain, blood in stool and constipation.  Genitourinary:  Negative for dysuria.  Neurological:  Negative for headaches.  No nose, gum bleeds. No easy bruising.   Please see HPI. All other systems reviewed and negative.     Objective:  Physical Exam Vitals reviewed.  Constitutional:       General: He is not in acute distress.    Appearance: He is normal weight. He is not toxic-appearing.  HENT:     Mouth/Throat:     Mouth: Mucous membranes are moist.     Pharynx: No oropharyngeal exudate.   Eyes:     Extraocular Movements: Extraocular movements intact.     Pupils: Pupils are equal, round, and reactive to light.    Cardiovascular:     Rate and Rhythm: Normal rate and regular rhythm.  Pulmonary:     Effort: Pulmonary effort is normal.     Breath sounds: Normal breath sounds.  Abdominal:     General: Abdomen is flat. There is no distension.     Palpations: There is no mass.     Tenderness: There is no abdominal tenderness.   Musculoskeletal:     Cervical back: Normal range of motion and neck supple.     Right lower leg: No edema.     Left lower leg: No edema.   Neurological:     Mental Status: He is alert.            Assessment & Plan:

## 2023-10-30 NOTE — Addendum Note (Signed)
 Addended by: Hebe Merriwether C on: 10/30/2023 03:58 PM   Modules accepted: Orders

## 2023-10-30 NOTE — Addendum Note (Signed)
 Addended by: Manfred Seed on: 10/30/2023 02:05 PM   Modules accepted: Orders

## 2023-10-30 NOTE — Addendum Note (Signed)
 Addended by: Manfred Seed on: 10/30/2023 03:21 PM   Modules accepted: Orders

## 2023-10-30 NOTE — Addendum Note (Signed)
 Addended by: Marilyn Nihiser C on: 10/30/2023 02:14 PM   Modules accepted: Orders

## 2023-10-30 NOTE — Assessment & Plan Note (Addendum)
 Will check his genotype Check his INR He will be started on epclusa [facility] for 8 weeks not covered) , explained to pt Asked him to stop lipitor while on this.  Will see him back in 8 weeks.  Hep B vax today Asked him to call if any difficulty.

## 2023-10-30 NOTE — Addendum Note (Signed)
 Addended by: Manfred Seed on: 10/30/2023 03:31 PM   Modules accepted: Orders

## 2023-10-31 ENCOUNTER — Telehealth: Payer: Self-pay

## 2023-10-31 LAB — HIV ANTIBODY (ROUTINE TESTING W REFLEX): HIV Screen 4th Generation wRfx: NONREACTIVE

## 2023-10-31 LAB — PROTIME-INR: Prothrombin Time: 11.6 s (ref 9.1–12.0)

## 2023-10-31 NOTE — Telephone Encounter (Signed)
 Patient called regarding the rx for Sofosbuvir-velpatasvir, patient stated the medication was $2,300. Patient is requesting a alternative medication to be sent to the pharmacy. Patient would also like to discuss a change in his medications.

## 2023-11-01 ENCOUNTER — Encounter: Admitting: Family

## 2023-11-01 ENCOUNTER — Other Ambulatory Visit (HOSPITAL_COMMUNITY): Payer: Self-pay

## 2023-11-01 ENCOUNTER — Encounter: Payer: Self-pay | Admitting: Internal Medicine

## 2023-11-01 NOTE — Telephone Encounter (Signed)
 Test claim shows brand medication is covered with [employer]. Attempted with brand and prior [employer] is needed. Will submit PA first and go from there with assistance options.    Insurance verification completed.   The patient is insured through [employer] .   Per test claim: PA required; PA submitted to above mentioned insurance via CoverMyMeds Key/confirmation #/EOC [id number]. Status is pending

## 2023-11-02 LAB — PROTIME-INR: INR: 1 (ref 0.9–1.2)

## 2023-11-02 LAB — HEPATITIS C GENOTYPE

## 2023-11-04 ENCOUNTER — Other Ambulatory Visit (HOSPITAL_COMMUNITY): Payer: Self-pay

## 2023-11-04 NOTE — Telephone Encounter (Signed)
 Pharmacy Patient Advocate Encounter  Received notification from [employer] that Prior Authorization for EPCLUSA 400-100MG  has been APPROVED from 11/01/23 to 01/24/24  Test claim shows 28 tabs for 28 days is $0 (can only fill for 28 days at a time)    PA #/Case ID/Reference #: [id number]

## 2023-11-05 ENCOUNTER — Ambulatory Visit

## 2023-11-06 NOTE — Telephone Encounter (Addendum)
 Contacted [facility]) to confirm medication was ready prior to contacting patient CMA informed that medication is being prepared through the specialty side and they are working on getting in ready at no cost to patient.  Once ready, it will mailed to patient's address. Attempted to contact pt, no answer, mailbox full.

## 2023-11-07 ENCOUNTER — Inpatient Hospital Stay: Attending: Internal Medicine

## 2023-11-07 DIAGNOSIS — C3491 Malignant neoplasm of unspecified part of right bronchus or lung: Secondary | ICD-10-CM

## 2023-11-07 DIAGNOSIS — C7931 Secondary malignant neoplasm of brain: Secondary | ICD-10-CM | POA: Insufficient documentation

## 2023-11-07 DIAGNOSIS — Z452 Encounter for adjustment and management of vascular access device: Secondary | ICD-10-CM | POA: Insufficient documentation

## 2023-11-07 DIAGNOSIS — C3490 Malignant neoplasm of unspecified part of unspecified bronchus or lung: Secondary | ICD-10-CM | POA: Insufficient documentation

## 2023-11-07 DIAGNOSIS — Z95828 Presence of other vascular implants and grafts: Secondary | ICD-10-CM

## 2023-11-07 MED ORDER — HEPARIN SOD (PORK) LOCK FLUSH 100 UNIT/ML IV SOLN
250.0000 [IU] | Freq: Once | INTRAVENOUS | Status: AC
  Administered 2023-11-07: 250 [IU]

## 2023-11-07 MED ORDER — SODIUM CHLORIDE 0.9% FLUSH
10.0000 mL | Freq: Once | INTRAVENOUS | Status: AC
  Administered 2023-11-07: 10 mL

## 2023-11-08 ENCOUNTER — Telehealth: Payer: Self-pay | Admitting: *Deleted

## 2023-11-08 NOTE — Telephone Encounter (Unsigned)
 Copied from CRM [id number]. Topic: General - Other >> Nov 07, 2023 10:44 AM Susanna ORN wrote: Reason for CRM: Patient wants to know if he can get a print out of all of his prescriptions. He states he will be in town later today and wants to stop by the office to pick it up. Please give patient a call once this has been completed so that he can know when to come by and pick it up. CB #: [phone].

## 2023-11-29 ENCOUNTER — Telehealth: Payer: Self-pay | Admitting: Internal Medicine

## 2023-11-29 NOTE — Telephone Encounter (Signed)
 Rescheule appointment per provider pal. Called left VM with changes made to the upcoming appointment.  VM full

## 2023-12-10 ENCOUNTER — Ambulatory Visit: Admitting: Student

## 2023-12-10 VITALS — BP 109/68 | HR 84 | Temp 97.6°F | Ht 70.0 in | Wt 164.0 lb

## 2023-12-10 DIAGNOSIS — E119 Type 2 diabetes mellitus without complications: Secondary | ICD-10-CM

## 2023-12-10 DIAGNOSIS — I10 Essential (primary) hypertension: Secondary | ICD-10-CM | POA: Diagnosis not present

## 2023-12-10 DIAGNOSIS — C3490 Malignant neoplasm of unspecified part of unspecified bronchus or lung: Secondary | ICD-10-CM

## 2023-12-10 DIAGNOSIS — C3491 Malignant neoplasm of unspecified part of right bronchus or lung: Secondary | ICD-10-CM

## 2023-12-10 DIAGNOSIS — B1920 Unspecified viral hepatitis C without hepatic coma: Secondary | ICD-10-CM | POA: Diagnosis not present

## 2023-12-10 NOTE — Progress Notes (Signed)
 CC: FU on chronic medical conditions.  HPI:  Jared Shea. is a 68 y.o. male living with a history stated below and presents today for follow-up on hypertension and diabetes..   Patient was last seen in resident [facility] on 09/2023.  He brought all his medicines as instructed at last OV.  Please see problem based assessment and plan for additional details.  Past Medical History:  Diagnosis Date   Brain mass    brain cancer   Bursitis of right hip    Chest pain 08/12/2018   Chronic cough    Dependence on nicotine  from cigarettes    Diabetes mellitus without complication (HCC)    Dysuria 04/03/2021   Essential hypertension 07/28/2018   Frequent headaches 06/27/2020   Metastatic lung cancer (metastasis from lung to other site) [facility])    nscl ca dx'd 12/2019   Polyarthralgia 06/15/2020   Seizures (HCC)    Viral illness 03/23/2019    Current Outpatient Medications on File Prior to Visit  Medication Sig Dispense Refill   aspirin  EC 81 MG tablet Take 1 tablet (81 mg total) by mouth daily. Swallow whole.     atorvastatin  (LIPITOR) 20 MG tablet Take 1 tablet (20 mg total) by mouth daily. (Patient not taking: Reported on 09/24/2023) 90 tablet 3   isosorbide  mononitrate (IMDUR ) 60 MG 24 hr tablet Take 60 mg by mouth daily.     levETIRAcetam  (KEPPRA ) 750 MG tablet TAKE 1 TABLET(750 MG) BY MOUTH TWICE DAILY 180 tablet 4   losartan -hydrochlorothiazide  (HYZAAR) 50-12.5 MG tablet Take 1 tablet by mouth daily. (Patient taking differently: Take 0.5 tablets by mouth daily.) 90 tablet 3   melatonin 5 MG TABS Take 1 tablet (5 mg total) by mouth at bedtime as needed (for sleep). 30 tablet 0   metFORMIN  (GLUCOPHAGE ) 500 MG tablet TAKE 1 TABLET(500 MG) BY MOUTH TWICE DAILY WITH A MEAL 120 tablet 2   metoprolol  succinate (TOPROL  XL) 25 MG 24 hr tablet Take 0.5 tablets (12.5 mg total) by mouth at bedtime. 90 tablet 3   nortriptyline  (PAMELOR ) 25 MG capsule Take 25 mg by mouth 2 (two) times daily.      Sofosbuvir -Velpatasvir  (EPCLUSA ) 400-100 MG TABS Take 1 tablet by mouth daily. 84 tablet 0   No current facility-administered medications on file prior to visit.    Review of Systems: ROS negative except for what is noted on the assessment and plan.  Vitals:   12/10/23 0819  BP: 109/68  Pulse: 84  Temp: 97.6 F (36.4 C)  TempSrc: Oral  SpO2: 97%  Weight: 164 lb (74.4 kg)  Height: 5' 10 (1.778 m)      Physical Exam: Constitutional: NAD Cardiovascular: RRR, no murmurs. Pulmonary/Chest: Clear bilateral lungs Abdominal: soft, non-tender, non-distended.  Assessment & Plan:   Patient discussed with Dr.  Francesco  Essential hypertension BP at goal.  Patient brought all his home medicines  to this OV visit. He denies chest pain or palpitation or dizziness. His BMP a month ago was normal.  Plan: - Continue metoprolol  succinate 12.5 mg nightly  - Continue losartan -HCTZ 50-12.5 mg half tablet daily  - Continue Imdur  60 mg daily - Important counseling provided to patient regarding medications  Non-small cell carcinoma of lung, stage 4 w/ mets to brain [facility]) Current Status: monitoring/observation. Follows with oncology and has appt next month. Also follows with neuro-oncology in setting of brain met s/p craniotomy.  -   Hepatitis C He is hep A immune, received 2/3 doses  of hep B.  Per review of his medicines today, he is here to start a treatment for hepatitis B.  He is confused the medication box with that of his blood pressure medicine, which looked fairly similar.  Plan: - Follow-up with Dr. Eben for hepatitis C. - Last dose of hepatitis B vaccine due on November 2025.   Missy Sandhoff, MD [facility] Internal Medicine, PGY-2  Date 12/10/2023 Time 8:47 PM

## 2023-12-10 NOTE — Assessment & Plan Note (Signed)
 He is hep A immune, received 2/3 doses of hep B.  Per review of his medicines today, he is here to start a treatment for hepatitis B.  He is confused the medication box with that of his blood pressure medicine, which looked fairly similar.  Plan: - Follow-up with Dr. Eben for hepatitis C. - Last dose of hepatitis B vaccine due on November 2025.

## 2023-12-10 NOTE — Assessment & Plan Note (Signed)
 BP at goal.  Patient brought all his home medicines  to this OV visit. He denies chest pain or palpitation or dizziness. His BMP a month ago was normal.  Plan: - Continue metoprolol  succinate 12.5 mg nightly  - Continue losartan -HCTZ 50-12.5 mg half tablet daily  - Continue Imdur  60 mg daily - Important counseling provided to patient regarding medications

## 2023-12-10 NOTE — Patient Instructions (Signed)
 It was a pleasure taking care of you today!    1.  Your cough is secondary to a virus, you should get better in the next couple of days.  You can buy over-the-counter Flonase to help with runny nose.  2.  Please continue to take all the medicines that you brought to the clinic today.  I strongly recommend getting a pillbox to help you manage all your medicines as well.  3.  Please take the medicine for the hepatitis C, it is labelled sofosbuvir    I have ordered the following labs for you:  Lab Orders  No laboratory test(s) ordered today      Follow up: 3 months   Please call the clinic if you have worsening cough, new fevers, or worsening shortness of breath at [phone].   Missy Sandhoff, MD  [facility]

## 2023-12-10 NOTE — Assessment & Plan Note (Signed)
 Current Status: monitoring/observation. Follows with oncology and has appt next month. Also follows with neuro-oncology in setting of brain met s/p craniotomy.

## 2023-12-11 ENCOUNTER — Telehealth: Payer: Self-pay

## 2023-12-11 ENCOUNTER — Telehealth: Payer: Self-pay | Admitting: *Deleted

## 2023-12-11 NOTE — Telephone Encounter (Signed)
 Forwarding message to Lela.

## 2023-12-11 NOTE — Telephone Encounter (Signed)
 Copied from CRM [id number]. Topic: Referral - Request for Referral >> Dec 11, 2023 12:31 PM Carrielelia G wrote: Patient sister is calling, she stated patient called her and stated he has come to the realization that he can no longer manage certain things by himself. She would like a call back to speak with someone who can aid in getting him home care assistance and assistance with medication management.  Please advise

## 2023-12-11 NOTE — Telephone Encounter (Signed)
 Returned call to the patient regarding PCS / patient states he is needing someone to come and set up his medications. Patient states he is able to feed/dress / bath self. Will have someone to check into it to see what can be done for him.

## 2023-12-12 NOTE — Progress Notes (Signed)
 Internal Medicine Clinic Attending  Case discussed with the resident at the time of the visit.  We reviewed the resident's history and exam and pertinent patient test results.  I agree with the assessment, diagnosis, and plan of care documented in the resident's note.

## 2023-12-16 ENCOUNTER — Telehealth: Payer: Self-pay | Admitting: *Deleted

## 2023-12-16 NOTE — Telephone Encounter (Signed)
 Jared Shea IN NEEDING SOMEONE TO HELP WITH MEDICATION NOT NEEDING PCS. WILL TALK WITH NURSE ON HOW TO HELP HIM. SPOKE WITH SISTER ALSO, ARLENE [phone].

## 2023-12-16 NOTE — Telephone Encounter (Addendum)
 I called pt's sister, no answer; left message for her to call the office. [facility] do the blister packs which has to be requested. [facility] can do 1 med per blister or  all am meds and all pm meds  per blister.

## 2023-12-23 ENCOUNTER — Telehealth: Payer: Self-pay | Admitting: *Deleted

## 2023-12-23 NOTE — Telephone Encounter (Signed)
 Copied from CRM #[id number]. Topic: General - Other >> Dec 23, 2023  1:47 PM Miquel SAILOR wrote: Reason for CRM: Patient sister arleen returing Catheleen Langhorne call.Called and transferred >> Dec 23, 2023  1:51 PM Miquel SAILOR wrote: DISREGARD ADDED IN [facility] 08/11

## 2023-12-23 NOTE — Telephone Encounter (Signed)
 I talked to pt's sister Jared Shea, who stated pt had a cold about 2 weeks ago and he stated he could not take cold med with his HepC med(Epclusa ) so he stopped the medication. And now the sister does not know what to do. Stated she had to go thru a lot in order for the pt get approved for this med. She stated should pt re-start the medication or what?  I called the pt - no answer. Mailbox is full; unable to leave a message. I called pt's sister back to let her know pt did not answer the telephone; no answer, left her a vm.

## 2023-12-23 NOTE — Telephone Encounter (Signed)
 I called pt - mailbox is full, unable to leave a message about his HepC med. Then I called pt's sister, Cindia Novak, no answer; left message on her vm pt needs to restart his medication (Epclusa ) asap per Dr Eben.

## 2023-12-23 NOTE — Telephone Encounter (Signed)
 Return call to pt's sister, Cindia Novak, no answer; left message of office's return call.

## 2023-12-23 NOTE — Telephone Encounter (Signed)
 Copied from CRM [id number]. Topic: Clinical - Medical Advice >> Dec 23, 2023 10:59 AM Farrel B wrote: Reason for CRM: [id number] Cindia Novak Patient's sister, whom stated the patient was on some medication for hepc and decided to bring a note over to her to inform her he was no longer taking the medication, didn't want to take it because the medication was given him a cold. She stated she did go and purchase a container to store his medications, but she is concerned he doesn't want to take it and needs to know what could she do to assist the patient.

## 2023-12-24 ENCOUNTER — Encounter: Payer: Self-pay | Admitting: Medical Oncology

## 2023-12-24 NOTE — Progress Notes (Signed)
 Return to sender mail received on pt.

## 2023-12-26 ENCOUNTER — Telehealth: Payer: Self-pay | Admitting: *Deleted

## 2023-12-26 ENCOUNTER — Inpatient Hospital Stay: Attending: Internal Medicine

## 2023-12-26 DIAGNOSIS — G40209 Localization-related (focal) (partial) symptomatic epilepsy and epileptic syndromes with complex partial seizures, not intractable, without status epilepticus: Secondary | ICD-10-CM | POA: Diagnosis present

## 2023-12-26 DIAGNOSIS — Z87891 Personal history of nicotine dependence: Secondary | ICD-10-CM | POA: Diagnosis not present

## 2023-12-26 DIAGNOSIS — Z85118 Personal history of other malignant neoplasm of bronchus and lung: Secondary | ICD-10-CM | POA: Diagnosis not present

## 2023-12-26 DIAGNOSIS — Z880 Allergy status to penicillin: Secondary | ICD-10-CM | POA: Diagnosis not present

## 2023-12-26 DIAGNOSIS — C7931 Secondary malignant neoplasm of brain: Secondary | ICD-10-CM | POA: Diagnosis present

## 2023-12-26 DIAGNOSIS — I2510 Atherosclerotic heart disease of native coronary artery without angina pectoris: Secondary | ICD-10-CM | POA: Diagnosis present

## 2023-12-26 DIAGNOSIS — Z841 Family history of disorders of kidney and ureter: Secondary | ICD-10-CM | POA: Diagnosis not present

## 2023-12-26 DIAGNOSIS — I429 Cardiomyopathy, unspecified: Secondary | ICD-10-CM | POA: Diagnosis present

## 2023-12-26 DIAGNOSIS — Z7982 Long term (current) use of aspirin: Secondary | ICD-10-CM | POA: Diagnosis not present

## 2023-12-26 DIAGNOSIS — Z5329 Procedure and treatment not carried out because of patient's decision for other reasons: Secondary | ICD-10-CM | POA: Diagnosis present

## 2023-12-26 DIAGNOSIS — Z888 Allergy status to other drugs, medicaments and biological substances status: Secondary | ICD-10-CM | POA: Diagnosis not present

## 2023-12-26 DIAGNOSIS — Z95828 Presence of other vascular implants and grafts: Secondary | ICD-10-CM

## 2023-12-26 DIAGNOSIS — E785 Hyperlipidemia, unspecified: Secondary | ICD-10-CM | POA: Diagnosis present

## 2023-12-26 DIAGNOSIS — E1110 Type 2 diabetes mellitus with ketoacidosis without coma: Secondary | ICD-10-CM | POA: Diagnosis present

## 2023-12-26 DIAGNOSIS — Z7984 Long term (current) use of oral hypoglycemic drugs: Secondary | ICD-10-CM | POA: Diagnosis not present

## 2023-12-26 DIAGNOSIS — Z833 Family history of diabetes mellitus: Secondary | ICD-10-CM | POA: Diagnosis not present

## 2023-12-26 DIAGNOSIS — Z79899 Other long term (current) drug therapy: Secondary | ICD-10-CM | POA: Diagnosis not present

## 2023-12-26 DIAGNOSIS — G4700 Insomnia, unspecified: Secondary | ICD-10-CM | POA: Diagnosis present

## 2023-12-26 DIAGNOSIS — I5022 Chronic systolic (congestive) heart failure: Secondary | ICD-10-CM | POA: Diagnosis present

## 2023-12-26 DIAGNOSIS — Z8249 Family history of ischemic heart disease and other diseases of the circulatory system: Secondary | ICD-10-CM | POA: Diagnosis not present

## 2023-12-26 DIAGNOSIS — I110 Hypertensive heart disease with heart failure: Secondary | ICD-10-CM | POA: Diagnosis present

## 2023-12-26 MED ORDER — SODIUM CHLORIDE 0.9% FLUSH
10.0000 mL | Freq: Once | INTRAVENOUS | Status: AC
  Administered 2023-12-26: 10 mL

## 2023-12-26 NOTE — Telephone Encounter (Signed)
 Received a call from [facility], who stated per protocol if pt has missed taking Epclusa  more than 7 days, a RNA level is recommended. She stated pt and pt's sister are unsure how long it has been, so [facility] recommends RNA level before starting the medication back.

## 2023-12-27 ENCOUNTER — Other Ambulatory Visit: Payer: Self-pay | Admitting: Infectious Diseases

## 2023-12-27 DIAGNOSIS — J069 Acute upper respiratory infection, unspecified: Secondary | ICD-10-CM

## 2023-12-27 DIAGNOSIS — B1710 Acute hepatitis C without hepatic coma: Secondary | ICD-10-CM

## 2023-12-28 ENCOUNTER — Emergency Department (HOSPITAL_COMMUNITY)

## 2023-12-28 ENCOUNTER — Other Ambulatory Visit: Payer: Self-pay

## 2023-12-28 ENCOUNTER — Encounter (HOSPITAL_COMMUNITY): Payer: Self-pay | Admitting: *Deleted

## 2023-12-28 ENCOUNTER — Emergency Department (HOSPITAL_COMMUNITY)
Admission: EM | Admit: 2023-12-28 | Discharge: 2023-12-28 | Discharge status: Against Medical Advice | Attending: Emergency Medicine | Admitting: Emergency Medicine

## 2023-12-28 ENCOUNTER — Inpatient Hospital Stay (HOSPITAL_COMMUNITY)
Admission: EM | Admit: 2023-12-28 | Discharge: 2023-12-28 | DRG: 638 | Discharge status: Against Medical Advice | Attending: Internal Medicine | Admitting: Internal Medicine

## 2023-12-28 DIAGNOSIS — Z87891 Personal history of nicotine dependence: Secondary | ICD-10-CM

## 2023-12-28 DIAGNOSIS — Z5986 Financial insecurity: Secondary | ICD-10-CM | POA: Diagnosis not present

## 2023-12-28 DIAGNOSIS — I110 Hypertensive heart disease with heart failure: Secondary | ICD-10-CM | POA: Diagnosis present

## 2023-12-28 DIAGNOSIS — G40209 Localization-related (focal) (partial) symptomatic epilepsy and epileptic syndromes with complex partial seizures, not intractable, without status epilepticus: Secondary | ICD-10-CM | POA: Diagnosis present

## 2023-12-28 DIAGNOSIS — R4182 Altered mental status, unspecified: Secondary | ICD-10-CM | POA: Diagnosis not present

## 2023-12-28 DIAGNOSIS — I2510 Atherosclerotic heart disease of native coronary artery without angina pectoris: Secondary | ICD-10-CM | POA: Diagnosis not present

## 2023-12-28 DIAGNOSIS — Z833 Family history of diabetes mellitus: Secondary | ICD-10-CM

## 2023-12-28 DIAGNOSIS — G40909 Epilepsy, unspecified, not intractable, without status epilepticus: Secondary | ICD-10-CM | POA: Diagnosis present

## 2023-12-28 DIAGNOSIS — Z7982 Long term (current) use of aspirin: Secondary | ICD-10-CM | POA: Diagnosis not present

## 2023-12-28 DIAGNOSIS — I5032 Chronic diastolic (congestive) heart failure: Secondary | ICD-10-CM

## 2023-12-28 DIAGNOSIS — Z8249 Family history of ischemic heart disease and other diseases of the circulatory system: Secondary | ICD-10-CM | POA: Diagnosis not present

## 2023-12-28 DIAGNOSIS — R918 Other nonspecific abnormal finding of lung field: Secondary | ICD-10-CM | POA: Diagnosis not present

## 2023-12-28 DIAGNOSIS — R079 Chest pain, unspecified: Secondary | ICD-10-CM | POA: Diagnosis not present

## 2023-12-28 DIAGNOSIS — I429 Cardiomyopathy, unspecified: Secondary | ICD-10-CM | POA: Diagnosis present

## 2023-12-28 DIAGNOSIS — E785 Hyperlipidemia, unspecified: Secondary | ICD-10-CM | POA: Diagnosis present

## 2023-12-28 DIAGNOSIS — Z85841 Personal history of malignant neoplasm of brain: Secondary | ICD-10-CM | POA: Diagnosis not present

## 2023-12-28 DIAGNOSIS — B182 Chronic viral hepatitis C: Secondary | ICD-10-CM | POA: Diagnosis present

## 2023-12-28 DIAGNOSIS — Z8639 Personal history of other endocrine, nutritional and metabolic disease: Secondary | ICD-10-CM

## 2023-12-28 DIAGNOSIS — G4700 Insomnia, unspecified: Secondary | ICD-10-CM | POA: Diagnosis present

## 2023-12-28 DIAGNOSIS — K8689 Other specified diseases of pancreas: Secondary | ICD-10-CM | POA: Diagnosis not present

## 2023-12-28 DIAGNOSIS — B1920 Unspecified viral hepatitis C without hepatic coma: Secondary | ICD-10-CM | POA: Diagnosis present

## 2023-12-28 DIAGNOSIS — C3490 Malignant neoplasm of unspecified part of unspecified bronchus or lung: Secondary | ICD-10-CM | POA: Diagnosis present

## 2023-12-28 DIAGNOSIS — Z794 Long term (current) use of insulin: Secondary | ICD-10-CM | POA: Diagnosis not present

## 2023-12-28 DIAGNOSIS — R109 Unspecified abdominal pain: Secondary | ICD-10-CM | POA: Insufficient documentation

## 2023-12-28 DIAGNOSIS — Z7984 Long term (current) use of oral hypoglycemic drugs: Secondary | ICD-10-CM

## 2023-12-28 DIAGNOSIS — Z841 Family history of disorders of kidney and ureter: Secondary | ICD-10-CM

## 2023-12-28 DIAGNOSIS — C7931 Secondary malignant neoplasm of brain: Secondary | ICD-10-CM | POA: Diagnosis present

## 2023-12-28 DIAGNOSIS — Z85118 Personal history of other malignant neoplasm of bronchus and lung: Secondary | ICD-10-CM | POA: Diagnosis present

## 2023-12-28 DIAGNOSIS — I5022 Chronic systolic (congestive) heart failure: Secondary | ICD-10-CM | POA: Diagnosis present

## 2023-12-28 DIAGNOSIS — E1110 Type 2 diabetes mellitus with ketoacidosis without coma: Principal | ICD-10-CM | POA: Diagnosis present

## 2023-12-28 DIAGNOSIS — I10 Essential (primary) hypertension: Secondary | ICD-10-CM | POA: Diagnosis not present

## 2023-12-28 DIAGNOSIS — I6782 Cerebral ischemia: Secondary | ICD-10-CM | POA: Diagnosis not present

## 2023-12-28 DIAGNOSIS — Z880 Allergy status to penicillin: Secondary | ICD-10-CM | POA: Diagnosis not present

## 2023-12-28 DIAGNOSIS — I6523 Occlusion and stenosis of bilateral carotid arteries: Secondary | ICD-10-CM | POA: Diagnosis not present

## 2023-12-28 DIAGNOSIS — Z5321 Procedure and treatment not carried out due to patient leaving prior to being seen by health care provider: Secondary | ICD-10-CM | POA: Insufficient documentation

## 2023-12-28 DIAGNOSIS — Z79899 Other long term (current) drug therapy: Secondary | ICD-10-CM | POA: Diagnosis not present

## 2023-12-28 DIAGNOSIS — Z59819 Housing instability, housed unspecified: Secondary | ICD-10-CM | POA: Diagnosis not present

## 2023-12-28 DIAGNOSIS — G9389 Other specified disorders of brain: Secondary | ICD-10-CM | POA: Diagnosis not present

## 2023-12-28 DIAGNOSIS — G9341 Metabolic encephalopathy: Secondary | ICD-10-CM | POA: Diagnosis present

## 2023-12-28 DIAGNOSIS — R519 Headache, unspecified: Secondary | ICD-10-CM | POA: Diagnosis not present

## 2023-12-28 DIAGNOSIS — Z8673 Personal history of transient ischemic attack (TIA), and cerebral infarction without residual deficits: Secondary | ICD-10-CM | POA: Diagnosis not present

## 2023-12-28 DIAGNOSIS — R410 Disorientation, unspecified: Secondary | ICD-10-CM | POA: Diagnosis not present

## 2023-12-28 DIAGNOSIS — Z888 Allergy status to other drugs, medicaments and biological substances status: Secondary | ICD-10-CM

## 2023-12-28 DIAGNOSIS — R4781 Slurred speech: Secondary | ICD-10-CM | POA: Insufficient documentation

## 2023-12-28 DIAGNOSIS — F03918 Unspecified dementia, unspecified severity, with other behavioral disturbance: Secondary | ICD-10-CM | POA: Diagnosis present

## 2023-12-28 DIAGNOSIS — E1165 Type 2 diabetes mellitus with hyperglycemia: Principal | ICD-10-CM

## 2023-12-28 DIAGNOSIS — Z5329 Procedure and treatment not carried out because of patient's decision for other reasons: Secondary | ICD-10-CM | POA: Diagnosis present

## 2023-12-28 DIAGNOSIS — G319 Degenerative disease of nervous system, unspecified: Secondary | ICD-10-CM | POA: Diagnosis not present

## 2023-12-28 HISTORY — DX: Personal history of other endocrine, nutritional and metabolic disease: Z86.39

## 2023-12-28 LAB — CBC WITH DIFFERENTIAL/PLATELET
Abs Immature Granulocytes: 0.03 K/uL (ref 0.00–0.07)
Basophils Absolute: 0.1 K/uL (ref 0.0–0.1)
Basophils Relative: 1 %
Eosinophils Absolute: 0.1 K/uL (ref 0.0–0.5)
Eosinophils Relative: 1 %
HCT: 39.8 % (ref 39.0–52.0)
Hemoglobin: 14 g/dL (ref 13.0–17.0)
Immature Granulocytes: 0 %
Lymphocytes Relative: 19 %
Lymphs Abs: 2 K/uL (ref 0.7–4.0)
MCH: 29.5 pg (ref 26.0–34.0)
MCHC: 35.2 g/dL (ref 30.0–36.0)
MCV: 83.8 fL (ref 80.0–100.0)
Monocytes Absolute: 0.9 K/uL (ref 0.1–1.0)
Monocytes Relative: 9 %
Neutro Abs: 7.4 K/uL (ref 1.7–7.7)
Neutrophils Relative %: 70 %
Platelets: 481 K/uL — ABNORMAL HIGH (ref 150–400)
RBC: 4.75 MIL/uL (ref 4.22–5.81)
RDW: 13.2 % (ref 11.5–15.5)
WBC: 10.5 K/uL (ref 4.0–10.5)
nRBC: 0 % (ref 0.0–0.2)

## 2023-12-28 LAB — BASIC METABOLIC PANEL WITH GFR
Anion gap: 18 — ABNORMAL HIGH (ref 5–15)
BUN: 28 mg/dL — ABNORMAL HIGH (ref 8–23)
CO2: 17 mmol/L — ABNORMAL LOW (ref 22–32)
Calcium: 10.4 mg/dL — ABNORMAL HIGH (ref 8.9–10.3)
Chloride: 95 mmol/L — ABNORMAL LOW (ref 98–111)
Creatinine, Ser: 1.11 mg/dL (ref 0.61–1.24)
GFR, Estimated: >60 mL/min (ref >60)
Glucose, Bld: 573 mg/dL (ref 70–99)
Potassium: 5.4 mmol/L — ABNORMAL HIGH (ref 3.5–5.1)
Sodium: 130 mmol/L — ABNORMAL LOW (ref 135–145)

## 2023-12-28 LAB — COMPREHENSIVE METABOLIC PANEL WITH GFR
ALT: 13 U/L (ref 0–44)
ALT: 12 U/L (ref 0–44)
AST: 15 U/L (ref 15–41)
AST: 16 U/L (ref 15–41)
Albumin: 4.4 g/dL (ref 3.5–5.0)
Albumin: 4.3 g/dL (ref 3.5–5.0)
Alkaline Phosphatase: 78 U/L (ref 38–126)
Alkaline Phosphatase: 74 U/L (ref 38–126)
Anion gap: 17 — ABNORMAL HIGH (ref 5–15)
Anion gap: 18 — ABNORMAL HIGH (ref 5–15)
BUN: 31 mg/dL — ABNORMAL HIGH (ref 8–23)
BUN: 21 mg/dL (ref 8–23)
CO2: 16 mmol/L — ABNORMAL LOW (ref 22–32)
CO2: 18 mmol/L — ABNORMAL LOW (ref 22–32)
Calcium: 10 mg/dL (ref 8.9–10.3)
Calcium: 10.2 mg/dL (ref 8.9–10.3)
Chloride: 93 mmol/L — ABNORMAL LOW (ref 98–111)
Chloride: 100 mmol/L (ref 98–111)
Creatinine, Ser: 1.08 mg/dL (ref 0.61–1.24)
Creatinine, Ser: 0.87 mg/dL (ref 0.61–1.24)
GFR, Estimated: >60 mL/min (ref >60)
GFR, Estimated: >60 mL/min (ref >60)
Glucose, Bld: 668 mg/dL (ref 70–99)
Glucose, Bld: 183 mg/dL — ABNORMAL HIGH (ref 70–99)
Potassium: 4.5 mmol/L (ref 3.5–5.1)
Potassium: 3.8 mmol/L (ref 3.5–5.1)
Sodium: 126 mmol/L — ABNORMAL LOW (ref 135–145)
Sodium: 136 mmol/L (ref 135–145)
Total Bilirubin: 1.1 mg/dL (ref 0.0–1.2)
Total Bilirubin: 0.9 mg/dL (ref 0.0–1.2)
Total Protein: 8.3 g/dL — ABNORMAL HIGH (ref 6.5–8.1)
Total Protein: 8.1 g/dL (ref 6.5–8.1)

## 2023-12-28 LAB — CBC
HCT: 41.9 % (ref 39.0–52.0)
Hemoglobin: 14.4 g/dL (ref 13.0–17.0)
MCH: 29.4 pg (ref 26.0–34.0)
MCHC: 34.4 g/dL (ref 30.0–36.0)
MCV: 85.7 fL (ref 80.0–100.0)
Platelets: 447 K/uL — ABNORMAL HIGH (ref 150–400)
RBC: 4.89 MIL/uL (ref 4.22–5.81)
RDW: 13 % (ref 11.5–15.5)
WBC: 8.7 K/uL (ref 4.0–10.5)
nRBC: 0 % (ref 0.0–0.2)

## 2023-12-28 LAB — URINALYSIS, ROUTINE W REFLEX MICROSCOPIC
Bacteria, UA: NONE SEEN
Bacteria, UA: NONE SEEN
Bilirubin Urine: NEGATIVE
Bilirubin Urine: NEGATIVE
Glucose, UA: >=500 mg/dL — AB
Glucose, UA: >=500 mg/dL — AB
Hgb urine dipstick: NEGATIVE
Hgb urine dipstick: NEGATIVE
Ketones, ur: 5 mg/dL — AB
Ketones, ur: 20 mg/dL — AB
Leukocytes,Ua: NEGATIVE
Leukocytes,Ua: NEGATIVE
Nitrite: NEGATIVE
Nitrite: NEGATIVE
Protein, ur: NEGATIVE mg/dL
Protein, ur: NEGATIVE mg/dL
Specific Gravity, Urine: 1.026 (ref 1.005–1.030)
Specific Gravity, Urine: 1.042 — ABNORMAL HIGH (ref 1.005–1.030)
pH: 5 (ref 5.0–8.0)
pH: 5 (ref 5.0–8.0)

## 2023-12-28 LAB — I-STAT VENOUS BLOOD GAS, ED
Acid-base deficit: 5 mmol/L — ABNORMAL HIGH (ref 0.0–2.0)
Bicarbonate: 18.5 mmol/L — ABNORMAL LOW (ref 20.0–28.0)
Calcium, Ion: 1.2 mmol/L (ref 1.15–1.40)
HCT: 44 % (ref 39.0–52.0)
Hemoglobin: 15 g/dL (ref 13.0–17.0)
O2 Saturation: 88 %
Potassium: 4.9 mmol/L (ref 3.5–5.1)
Sodium: 129 mmol/L — ABNORMAL LOW (ref 135–145)
TCO2: 19 mmol/L — ABNORMAL LOW (ref 22–32)
pCO2, Ven: 29.1 mmHg — ABNORMAL LOW (ref 44–60)
pH, Ven: 7.412 (ref 7.25–7.43)
pO2, Ven: 53 mmHg — ABNORMAL HIGH (ref 32–45)

## 2023-12-28 LAB — LIPASE, BLOOD
Lipase: 24 U/L (ref 11–51)
Lipase: 23 U/L (ref 11–51)

## 2023-12-28 LAB — CBG MONITORING, ED
Glucose-Capillary: >600 mg/dL (ref 70–99)
Glucose-Capillary: 510 mg/dL (ref 70–99)
Glucose-Capillary: 405 mg/dL — ABNORMAL HIGH (ref 70–99)
Glucose-Capillary: 323 mg/dL — ABNORMAL HIGH (ref 70–99)
Glucose-Capillary: 188 mg/dL — ABNORMAL HIGH (ref 70–99)

## 2023-12-28 LAB — HEMOGLOBIN A1C
Hgb A1c MFr Bld: 13.1 % — ABNORMAL HIGH (ref 4.8–5.6)
Mean Plasma Glucose: 329.27 mg/dL

## 2023-12-28 LAB — BETA-HYDROXYBUTYRIC ACID: Beta-Hydroxybutyric Acid: 4.43 mmol/L — ABNORMAL HIGH (ref 0.05–0.27)

## 2023-12-28 LAB — OSMOLALITY: Osmolality: 318 mosm/kg — ABNORMAL HIGH (ref 275–295)

## 2023-12-28 MED ORDER — LOSARTAN POTASSIUM 50 MG PO TABS: 50.0000 mg | ORAL_TABLET | Freq: Every day | ORAL | Status: DC

## 2023-12-28 MED ORDER — ISOSORBIDE MONONITRATE ER 30 MG PO TB24: 60.0000 mg | ORAL_TABLET | Freq: Every day | ORAL | Status: DC

## 2023-12-28 MED ORDER — CHLORHEXIDINE GLUCONATE CLOTH 2 % EX PADS: 6.0000 | MEDICATED_PAD | Freq: Every day | CUTANEOUS | Status: DC

## 2023-12-28 MED ORDER — SODIUM CHLORIDE 0.9% FLUSH: 10.0000 mL | Freq: Two times a day (BID) | INTRAVENOUS | Status: DC

## 2023-12-28 MED ORDER — ACETAMINOPHEN 325 MG PO TABS: 650.0000 mg | ORAL_TABLET | Freq: Four times a day (QID) | ORAL | Status: DC | PRN

## 2023-12-28 MED ORDER — POTASSIUM CHLORIDE 10 MEQ/100ML IV SOLN
10.0000 meq | INTRAVENOUS | Status: AC
  Administered 2023-12-28 (×2): 10 meq via INTRAVENOUS
  Filled 2023-12-28 (×2): qty 100

## 2023-12-28 MED ORDER — HYDROCHLOROTHIAZIDE 12.5 MG PO TABS: 12.5000 mg | ORAL_TABLET | Freq: Every day | ORAL | Status: DC

## 2023-12-28 MED ORDER — INSULIN REGULAR(HUMAN) IN NACL 100-0.9 UT/100ML-% IV SOLN
INTRAVENOUS | Status: DC
  Administered 2023-12-28: 13 [IU]/h via INTRAVENOUS
  Filled 2023-12-28: qty 100

## 2023-12-28 MED ORDER — METOPROLOL SUCCINATE ER 25 MG PO TB24: 12.5000 mg | ORAL_TABLET | Freq: Every day | ORAL | Status: DC

## 2023-12-28 MED ORDER — MELATONIN 5 MG PO TABS: 5.0000 mg | ORAL_TABLET | Freq: Every evening | ORAL | Status: DC | PRN

## 2023-12-28 MED ORDER — DEXTROSE IN LACTATED RINGERS 5 % IV SOLN
INTRAVENOUS | Status: DC
Start: 2023-12-28 — End: 2023-12-28

## 2023-12-28 MED ORDER — LOSARTAN POTASSIUM-HCTZ 50-12.5 MG PO TABS: 1.0000 | ORAL_TABLET | Freq: Every day | ORAL | Status: DC

## 2023-12-28 MED ORDER — LACTATED RINGERS IV BOLUS: 1000.0000 mL | Freq: Once | INTRAVENOUS | Status: DC

## 2023-12-28 MED ORDER — ACETAMINOPHEN 650 MG RE SUPP: 650.0000 mg | Freq: Four times a day (QID) | RECTAL | Status: DC | PRN

## 2023-12-28 MED ORDER — ENOXAPARIN SODIUM 40 MG/0.4ML IJ SOSY: 40.0000 mg | PREFILLED_SYRINGE | INTRAMUSCULAR | Status: DC

## 2023-12-28 MED ORDER — IOHEXOL 350 MG/ML SOLN
75.0000 mL | Freq: Once | INTRAVENOUS | Status: AC | PRN
  Administered 2023-12-28: 75 mL via INTRAVENOUS

## 2023-12-28 MED ORDER — NORTRIPTYLINE HCL 25 MG PO CAPS
25.0000 mg | ORAL_CAPSULE | Freq: Two times a day (BID) | ORAL | Status: DC
  Filled 2023-12-28: qty 1

## 2023-12-28 MED ORDER — ONDANSETRON HCL 4 MG/2ML IJ SOLN: 4.0000 mg | Freq: Once | INTRAMUSCULAR | Status: DC

## 2023-12-28 MED ORDER — ASPIRIN 81 MG PO TBEC: 81.0000 mg | DELAYED_RELEASE_TABLET | Freq: Every day | ORAL | Status: DC

## 2023-12-28 MED ORDER — DEXTROSE 50 % IV SOLN: 0.0000 mL | INTRAVENOUS | Status: DC | PRN

## 2023-12-28 MED ORDER — SODIUM CHLORIDE 0.9% FLUSH: 10.0000 mL | INTRAVENOUS | Status: DC | PRN

## 2023-12-28 MED ORDER — LEVETIRACETAM 500 MG PO TABS: 750.0000 mg | ORAL_TABLET | Freq: Two times a day (BID) | ORAL | Status: DC

## 2023-12-28 MED ORDER — LACTATED RINGERS IV SOLN: INTRAVENOUS | Status: DC

## 2023-12-28 MED ORDER — ATORVASTATIN CALCIUM 40 MG PO TABS: 20.0000 mg | ORAL_TABLET | Freq: Every day | ORAL | Status: DC

## 2023-12-28 MED ORDER — LACTATED RINGERS IV BOLUS
2000.0000 mL | Freq: Once | INTRAVENOUS | Status: AC
  Administered 2023-12-28: 2000 mL via INTRAVENOUS

## 2023-12-28 NOTE — Progress Notes (Signed)
 Notified by nurse that patient may have eloped.  Patient unable to be found.  Instructed nurse to document this.  If patient not found, DC summary to follow.

## 2023-12-28 NOTE — ED Notes (Signed)
 Patient has still not gotten back to assigned room. Provider Tobie advised this nurse to wait another 30 mins to see if patient returns. At that time a disposition on AMA will be placed.

## 2023-12-28 NOTE — Discharge Summary (Signed)
 Name: Jared Shea. MRN: [id number] DOB: Oct 16, 1955 68 y.o. PCP: Elicia Sharper, DO  Date of Admission: 12/28/2023  9:48 AM Date of Discharge:  Attending Physician: Trudy Mliss Dragon, MD   Patient was discharged Against Medical Advice  Discharge Diagnosis: Principal Problem:   DKA (diabetic ketoacidosis) (HCC) Active Problems:   Partial symptomatic epilepsy with complex partial seizures, not intractable, without status epilepticus (HCC)   Non-small cell carcinoma of lung, stage 4 w/ mets to brain (HCC)   Cardiomyopathy, unspecified (HCC)   Hyperlipidemia   Hepatitis C  Discharge Medications: Allergies as of 12/28/2023       Reactions   Jardiance  [empagliflozin ]    Caused diarrhea & itching, so we stopped   Penicillins Rash   Did it involve swelling of the face/tongue/throat, SOB, or low BP? N Did it involve sudden or severe rash/hives, skin peeling, or any reaction on the inside of your mouth or nose? Y Did you need to seek medical attention at a hospital or doctor's office? Y When did it last happen?    childhood   If all above answers are "NO", may proceed with cephalosporin use.        Medication List     STOP taking these medications    Sofosbuvir -Velpatasvir  400-100 MG Tabs Commonly known as: Epclusa        TAKE these medications    aspirin  EC 81 MG tablet Take 1 tablet (81 mg total) by mouth daily. Swallow whole.   atorvastatin  20 MG tablet Commonly known as: Lipitor Take 1 tablet (20 mg total) by mouth daily.   isosorbide  mononitrate 60 MG 24 hr tablet Commonly known as: IMDUR  Take 60 mg by mouth daily.   levETIRAcetam  750 MG tablet Commonly known as: KEPPRA  TAKE 1 TABLET(750 MG) BY MOUTH TWICE DAILY   losartan -hydrochlorothiazide  50-12.5 MG tablet Commonly known as: Hyzaar Take 1 tablet by mouth daily.   melatonin 5 MG Tabs Take 1 tablet (5 mg total) by mouth at bedtime as needed (for sleep).   metFORMIN  500 MG tablet Commonly  known as: GLUCOPHAGE  TAKE 1 TABLET(500 MG) BY MOUTH TWICE DAILY WITH A MEAL   metoprolol  succinate 25 MG 24 hr tablet Commonly known as: Toprol  XL Take 0.5 tablets (12.5 mg total) by mouth at bedtime.   nortriptyline  25 MG capsule Commonly known as: PAMELOR  Take 25 mg by mouth 2 (two) times daily.        Disposition and follow-up:   Mr.Shanon Khaalid Lefkowitz. was discharged from [facility] in Unknown  condition.  At the hospital follow up visit please address:   1.  Follow-up:             a.  Diabetes: Likely in the setting of pancreatic insufficiency from history of immunotherapy.  Patient left AMA.  Likely will need to start insulin  outpatient.  High risk of bounce back given DKA was not fully treated.               b.  Hepatitis C: Patient needs RNA viral load outpatient.  Wanted to get this inpatient, but patient left AMA.              2.  Labs / imaging needed at time of follow-up: fasting glucose and A1C, BMP, Hep C RNA viral load    3.  Pending labs/ test needing follow-up:    4.  Medication Changes             N/A  Follow-up  Appointments:  Follow-up Information     Elicia Sharper, DO. Schedule an appointment as soon as possible for a visit in 2 day(s).   Specialty: Internal Medicine Why: For hospital follow up Contact information: [address] [place] [address] [phone]                 Hospital Course by problem list: Carthel Castille. is a 68 y.o. person living with a history of diabetes mellitus who presents with concerns of abdominal pain, nausea, vomiting and admitted for further evaluation management of DKA.   #Diabetes mellitus in DKA Patient has past medical history of diabetes.  There is some question on whether this is diabetes secondary to pancreatic insufficiency or type 2 diabetes from metabolic issues.  Patient did have charting noting that he may have developed diabetes due to immunotherapy when he has been  treated in the past for his lung cancer.  He has been treated well with A1c 6.1 about 3 months ago.  With concern for DKA, patient was started on Endo tool.  Before being able to finish the process, patient eloped.  Unable to finish treatment.  High risk for bounce back.   #History of small cell lung cancer with brain mets Patient follows with oncology.  He is currently under observation.  He last saw his oncologist on 09/26/2023.  He has completed weekly chemoradiation and carboplatin  for 5 cycles.  He also has completed immunotherapy with Libtayo  which likely contributed to pancreatic atrophy.  His last treatment was 04/10/2022.  Plans for him to get a restaging scan CT scan in November.  Unable to discuss this with patient.  Can follow-up outpatient.  Patient left AMA.  He eloped.  High risk of bounce back.   #HFmrEF #CAD Most recent echocardiogram in 10/19/2022 showing ejection fraction of 50 to 55%.  Patient does have grade 1 diastolic dysfunction.  Patient has mitral regurg.  No acute concern for exacerbation at this time.  Did resume home medications except for Imdur  during hospitalization.  CT showed evidence of atherosclerotic calcifications in coronary arteries.  Could not follow this up with the patient as she eloped.  High risk for bounce back.   #Hypertension Patient has a past medical history of hypertension.  Home medications include Imdur  60 mg daily, losartan -HCTZ 50-12.5 mg daily, metoprolol  succinate 12.5 mg nightly.  Blood pressure measuring into the 140s systolics.  Unable to titrate medications as patient eloped and left AMA.  High risk for bounce back.   #History of hepatitis C Patient has been treated with Epclusa  400-100 mg daily, but unclear if patient has been adherent to this.  Per charting, patient has been holding this medication and not taking it.  Per specialty pharmacy, patient cannot resume this medication until patient is able to get RNA testing.  Most recent RNA  testing showing 803,000 viral load in July 25, 2023.  Wanted to get RNA viral load, but unable to do so given patient eloped and left AMA.  High risk for bounce back.  Hope to follows up outpatient.   #History of seizures No acute concerns for seizures during this hospitalization.  Patient left hospital AMA and eloped.  High risk for bounce back.   #History of headaches No acute concern during hospitalization.  Patient eloped and left AMA.  High risk for bounce back.   #Insomnia No acute concerns during hospitalization.  Patient eloped and left AMA.  High risk for bounce back.  Subjective:  Unable to speak to the patient   Discharge Exam:   BP (!) 144/68   Pulse 81   Temp 97.6 F (36.4 C) (Oral)   Resp 13   Ht 5' 10 (1.778 m)   Wt 74 kg   SpO2 100%   BMI 23.41 kg/m   Discharge exam: Unable to do discharge exam.   Pertinent Labs, Studies, and Procedures:      Latest Ref Rng & Units 12/28/2023   12:08 PM 12/28/2023    9:59 AM 09/13/2023    7:25 PM  CBC  WBC 4.0 - 10.5 K/uL   8.7  9.4   Hemoglobin 13.0 - 17.0 g/dL 84.9  85.5  87.0   Hematocrit 39.0 - 52.0 % 44.0  41.9  38.2   Platelets 150 - 400 K/uL   447  473           Latest Ref Rng & Units 12/28/2023    1:12 PM 12/28/2023   12:08 PM 12/28/2023    9:59 AM  CMP  Glucose 70 - 99 mg/dL 426    331   BUN 8 - 23 mg/dL 28    31   Creatinine 9.38 - 1.24 mg/dL 8.88    8.91   Sodium 864 - 145 mmol/L 130  129  126   Potassium 3.5 - 5.1 mmol/L 5.4  4.9  4.5   Chloride 98 - 111 mmol/L 95    93   CO2 22 - 32 mmol/L 17    16   Calcium  8.9 - 10.3 mg/dL 89.5    89.9   Total Protein 6.5 - 8.1 g/dL     8.3   Total Bilirubin 0.0 - 1.2 mg/dL     1.1   Alkaline Phos 38 - 126 U/L     78   AST 15 - 41 U/L     15   ALT 0 - 44 U/L     13       CT ABDOMEN PELVIS W CONTRAST Result Date: 12/28/2023 CLINICAL DATA:  Chest and abdominal pain. History of non-small cell cancer. EXAM: CT ANGIOGRAPHY CHEST CT ABDOMEN AND PELVIS WITH CONTRAST  TECHNIQUE: Multidetector CT imaging of the chest was performed using the standard protocol during bolus administration of intravenous contrast. Multiplanar CT image reconstructions and MIPs were obtained to evaluate the vascular anatomy. Multidetector CT imaging of the abdomen and pelvis was performed using the standard protocol during bolus administration of intravenous contrast. RADIATION DOSE REDUCTION: This exam was performed according to the departmental dose-optimization program which includes automated exposure control, adjustment of the mA and/or kV according to patient size and/or use of iterative reconstruction technique. CONTRAST:  75mL OMNIPAQUE  IOHEXOL  350 MG/ML SOLN COMPARISON:  CT scan 09/10/2023 FINDINGS: CTA CHEST FINDINGS Cardiovascular: The heart is normal in size. No pericardial effusion. The aorta is normal in caliber. No dissection or atherosclerotic calcification. Scattered three-vessel coronary artery calcifications are noted. The pulmonary arterial tree is well opacified. No filling defects to suggest pulmonary embolism. Mediastinum/Nodes: Scattered mediastinal and hilar lymph nodes are stable. No mass or overt adenopathy. No new or progressive changes. The esophagus is grossly normal. Lungs/Pleura: Stable underlying emphysematous changes and areas of pulmonary scarring. No infiltrates, edema or effusions. No worrisome pulmonary lesions or pulmonary nodules. Musculoskeletal: No supraclavicular or axillary adenopathy. The right IJ Port-A-Cath is stable. No worrisome lytic sclerotic bone lesions. Review of the MIP images confirms the above findings. CT ABDOMEN and PELVIS FINDINGS Hepatobiliary: Stable fatty change  near the falciform ligament. No worrisome hepatic lesions or intrahepatic biliary dilatation. The gallbladder is normal. No common bile duct dilatation. Pancreas: Progressive diffuse pancreatic atrophy but no mass, inflammation or ductal dilatation. Spleen: Normal in size without  focal abnormality. Adrenals/Urinary Tract: The adrenal glands are normal. No renal lesions or hydronephrosis. The bladder is unremarkable. Stomach/Bowel: The stomach, duodenum, small bowel and colon are grossly normal without oral contrast. No inflammatory changes, mass lesions or obstructive findings. The appendix is normal. Stable colonic diverticulosis. Vascular/Lymphatic: The aorta is normal in caliber. No dissection. Stable age advanced atherosclerotic calcifications. The branch vessels are patent. The major venous structures are patent. No mesenteric or retroperitoneal mass or adenopathy. Small scattered lymph nodes are noted. Reproductive: Prostate is unremarkable. Other: No pelvic mass or adenopathy. No free pelvic fluid collections. No inguinal mass or adenopathy. No abdominal wall hernia or subcutaneous lesions. Musculoskeletal: No significant bony findings. Review of the MIP images confirms the above findings. IMPRESSION: 1. No CT findings for pulmonary embolism. 2. Normal caliber thoracic aorta.  No dissection. 3. Stable emphysematous changes and areas of pulmonary scarring. 4. No acute abdominal/pelvic findings, mass lesions or adenopathy. 5. Progressive diffuse pancreatic atrophy but no mass, inflammation or ductal dilatation. 6. Stable age advanced atherosclerotic calcifications involving the aorta and branch vessels including the coronary arteries. Aortic Atherosclerosis (ICD10-I70.0) and Emphysema (ICD10-J43.9). Electronically Signed   By: MYRTIS Stammer M.D.   On: 12/28/2023 14:33    CT Angio Chest PE W and/or Wo Contrast Result Date: 12/28/2023 CLINICAL DATA:  Chest and abdominal pain. History of non-small cell cancer. EXAM: CT ANGIOGRAPHY CHEST CT ABDOMEN AND PELVIS WITH CONTRAST TECHNIQUE: Multidetector CT imaging of the chest was performed using the standard protocol during bolus administration of intravenous contrast. Multiplanar CT image reconstructions and MIPs were obtained to evaluate  the vascular anatomy. Multidetector CT imaging of the abdomen and pelvis was performed using the standard protocol during bolus administration of intravenous contrast. RADIATION DOSE REDUCTION: This exam was performed according to the departmental dose-optimization program which includes automated exposure control, adjustment of the mA and/or kV according to patient size and/or use of iterative reconstruction technique. CONTRAST:  75mL OMNIPAQUE  IOHEXOL  350 MG/ML SOLN COMPARISON:  CT scan 09/10/2023 FINDINGS: CTA CHEST FINDINGS Cardiovascular: The heart is normal in size. No pericardial effusion. The aorta is normal in caliber. No dissection or atherosclerotic calcification. Scattered three-vessel coronary artery calcifications are noted. The pulmonary arterial tree is well opacified. No filling defects to suggest pulmonary embolism. Mediastinum/Nodes: Scattered mediastinal and hilar lymph nodes are stable. No mass or overt adenopathy. No new or progressive changes. The esophagus is grossly normal. Lungs/Pleura: Stable underlying emphysematous changes and areas of pulmonary scarring. No infiltrates, edema or effusions. No worrisome pulmonary lesions or pulmonary nodules. Musculoskeletal: No supraclavicular or axillary adenopathy. The right IJ Port-A-Cath is stable. No worrisome lytic sclerotic bone lesions. Review of the MIP images confirms the above findings. CT ABDOMEN and PELVIS FINDINGS Hepatobiliary: Stable fatty change near the falciform ligament. No worrisome hepatic lesions or intrahepatic biliary dilatation. The gallbladder is normal. No common bile duct dilatation. Pancreas: Progressive diffuse pancreatic atrophy but no mass, inflammation or ductal dilatation. Spleen: Normal in size without focal abnormality. Adrenals/Urinary Tract: The adrenal glands are normal. No renal lesions or hydronephrosis. The bladder is unremarkable. Stomach/Bowel: The stomach, duodenum, small bowel and colon are grossly normal  without oral contrast. No inflammatory changes, mass lesions or obstructive findings. The appendix is normal. Stable colonic diverticulosis. Vascular/Lymphatic: The aorta  is normal in caliber. No dissection. Stable age advanced atherosclerotic calcifications. The branch vessels are patent. The major venous structures are patent. No mesenteric or retroperitoneal mass or adenopathy. Small scattered lymph nodes are noted. Reproductive: Prostate is unremarkable. Other: No pelvic mass or adenopathy. No free pelvic fluid collections. No inguinal mass or adenopathy. No abdominal wall hernia or subcutaneous lesions. Musculoskeletal: No significant bony findings. Review of the MIP images confirms the above findings. IMPRESSION: 1. No CT findings for pulmonary embolism. 2. Normal caliber thoracic aorta.  No dissection. 3. Stable emphysematous changes and areas of pulmonary scarring. 4. No acute abdominal/pelvic findings, mass lesions or adenopathy. 5. Progressive diffuse pancreatic atrophy but no mass, inflammation or ductal dilatation. 6. Stable age advanced atherosclerotic calcifications involving the aorta and branch vessels including the coronary arteries. Aortic Atherosclerosis (ICD10-I70.0) and Emphysema (ICD10-J43.9). Electronically Signed   By: MYRTIS Stammer M.D.   On: 12/28/2023 14:33    CT Head Wo Contrast Result Date: 12/28/2023 CLINICAL DATA:  Headache, increasing frequency or severity hx cancer EXAM: CT HEAD WITHOUT CONTRAST TECHNIQUE: Contiguous axial images were obtained from the base of the skull through the vertex without intravenous contrast. RADIATION DOSE REDUCTION: This exam was performed according to the departmental dose-optimization program which includes automated exposure control, adjustment of the mA and/or kV according to patient size and/or use of iterative reconstruction technique. COMPARISON:  MRI head 03/20/2019 FINDINGS: Brain: Patchy and confluent areas of decreased attenuation are noted  throughout the deep and periventricular white matter of the cerebral hemispheres bilaterally, compatible with chronic microvascular ischemic disease. No evidence of large-territorial acute infarction. No parenchymal hemorrhage. No mass lesion. No extra-axial collection. No mass effect or midline shift. No hydrocephalus. Basilar cisterns are patent. Vascular: No hyperdense vessel. Atherosclerotic calcifications are present within the cavernous internal carotid arteries. Skull: No acute fracture or focal lesion.  Prior right burr holes. Sinuses/Orbits: Paranasal sinuses and mastoid air cells are clear. The orbits are unremarkable. Other: None. IMPRESSION: No acute intracranial abnormality. Electronically Signed   By: Morgane  Naveau M.D.   On: 12/28/2023 14:20     Discharge Instructions:   Signed: Tobie Gaines, DO 12/28/2023, 6:37 PM   Pager: [phone]

## 2023-12-28 NOTE — ED Notes (Signed)
 This nurse walked into patient's room to medicate and seen that patient had eloped. He removed IV and was not located on the unit or the restroom. Charge nurse was made aware as well as MD Tobie.

## 2023-12-28 NOTE — ED Triage Notes (Signed)
 Patient c/o abd pain and decreased energy with a decreased appetite for 10days , states he has lung cancer and also has it in his brain, states he was seen by his pcp earlier in the week however wasn't given  a DX.

## 2023-12-28 NOTE — Hospital Course (Addendum)
 Medications:  Aspirin  81 mg daily Lipitor 20 mg daily Imdur  60 mg daily Keppra  750 mg twice daily Losartan -HCTZ 50-12.5 mg daily Melatonin 5 mg daily Metformin  500 mg twice daily Metoprolol  succinate 12.5 mg nightly Nortriptyline  25 mg twice daily Epclusa  400-100 mg daily  Lives with: Occupation: Level of function: Support: PCP: Jared Nearing, DO  Substance use:   Medical history: Diabetes mellitus, hypertension, metastatic lung cancer with brain mets, epilepsy.  Family history:   89  Lung cancer to his brain On metformin   Came here for abmdinal pain  Generalized weakness Sugars are high  Not eating to well and eating chicken broth.  Vomiting Losing weight   Cp and Dyspnea  Sugars at 600, pH okay B 4.4. Imaging is good   Endotool Feeling better.   He states that he was having left arm pain. He is worried about his sugars. He states that his stomach was hurting this morning. He states that the abdominal pain started yesterday. He states that his eye sight was getting messed up. He denies any chest pain. He did have shortness of breath yesterday. He states that the stomach pain is the worst problem. He did not check his sugar yesterday. He felt that n

## 2023-12-28 NOTE — ED Provider Notes (Signed)
 [place] [facility] AT [facility] Provider Note  CSN: [id number] Arrival date & time: 12/28/23 9057  Chief Complaint(s) Abdominal Pain  HPI Jared Heyde. is a 68 y.o. male with past medical history as below, significant for metastatic lung cancer to his brain, DM on metformin , chronic cough, hypertension, epilepsy who presents to the ED with complaint of abdominal pain, dyspnea  Patient reports feeling unwell over the past 2 weeks, poor appetite, has been sustaining himself on Ensure shakes and chicken broth.  He is having early satiety, abdominal pain, bloating feeling.  Had some dyspnea last night with chest tightness that has since resolved.  Reports he is very thirsty, he has been urinating very frequently.  Had episode of emesis upon arrival to the ER, no ongoing nausea.  Reports has not had a bowel movement in over a week.  He says he is no longer receiving treatment for his metastatic lung cancer; currently being monitored by oncology.  Denies fevers, chills, recent travel, sick contacts, current chest pain or dyspnea, rashes.  Reports he is compliant with his regular medications   Past Medical History Past Medical History:  Diagnosis Date   Brain mass    brain cancer   Bursitis of right hip    Chest pain 08/12/2018   Chronic cough    Dependence on nicotine  from cigarettes    Diabetes mellitus without complication (HCC)    Dysuria 04/03/2021   Essential hypertension 07/28/2018   Frequent headaches 06/27/2020   Metastatic lung cancer (metastasis from lung to other site) [facility])    nscl ca dx'd 12/2019   Polyarthralgia 06/15/2020   Seizures (HCC)    Viral illness 03/23/2019   Patient Active Problem List   Diagnosis Date Noted   DKA (diabetic ketoacidosis) (HCC) 12/28/2023   Hepatitis C 09/12/2023   Hyperlipidemia 02/19/2023   Sleep disorder, circadian 08/20/2022   Cardiomyopathy, unspecified (HCC) 08/20/2022   PVC (premature ventricular  contraction) 05/21/2022   Health care maintenance 03/16/2022   Bilateral primary osteoarthritis of knee 12/06/2021   Erectile dysfunction 07/11/2021   Vision changes 03/14/2021   Memory loss 03/14/2021   Port-A-Cath in place 07/05/2020   Encounter for antineoplastic immunotherapy 04/18/2020   Adenocarcinoma, metastatic (HCC) 01/07/2020   Diabetes mellitus (HCC) 01/06/2020   Adjustment disorder with depressed mood 01/06/2020   Thyroid  nodule 01/04/2020   Non-small cell carcinoma of lung, stage 4 w/ mets to brain (HCC) 12/31/2019   Malignant neoplasm metastatic to brain (HCC) 12/23/2019   Partial symptomatic epilepsy with complex partial seizures, not intractable, without status epilepticus (HCC) 12/09/2019   Essential hypertension 07/28/2018   Home Medication(s) Prior to Admission medications   Medication Sig Start Date End Date Taking? Authorizing Provider  aspirin  EC 81 MG tablet Take 1 tablet (81 mg total) by mouth daily. Swallow whole. 01/08/23   Tobie Gaines, DO  atorvastatin  (LIPITOR) 20 MG tablet Take 1 tablet (20 mg total) by mouth daily. Patient not taking: Reported on 09/24/2023 09/05/23 09/04/24  Zheng, Michael, DO  isosorbide  mononitrate (IMDUR ) 60 MG 24 hr tablet Take 60 mg by mouth daily. 09/10/23   [provider]  levETIRAcetam  (KEPPRA ) 750 MG tablet TAKE 1 TABLET(750 MG) BY MOUTH TWICE DAILY 05/06/23   Vaslow, Zachary K, MD  losartan -hydrochlorothiazide  (HYZAAR) 50-12.5 MG tablet Take 1 tablet by mouth daily. Patient taking differently: Take 0.5 tablets by mouth daily. 09/05/23 09/04/24  Zheng, Michael, DO  melatonin 5 MG TABS Take 1 tablet (5 mg total) by mouth  at bedtime as needed (for sleep). 08/20/22   Jinwala, Sagar H, MD  metFORMIN  (GLUCOPHAGE ) 500 MG tablet TAKE 1 TABLET(500 MG) BY MOUTH TWICE DAILY WITH A MEAL 08/12/23   Zheng, Michael, DO  metoprolol  succinate (TOPROL  XL) 25 MG 24 hr tablet Take 0.5 tablets (12.5 mg total) by mouth at bedtime. 09/20/23    Alexander-Savino, [place], MD  nortriptyline  (PAMELOR ) 25 MG capsule Take 25 mg by mouth 2 (two) times daily. 05/27/22   [provider]  Sofosbuvir -Velpatasvir  (EPCLUSA ) 400-100 MG TABS Take 1 tablet by mouth daily. 10/30/23 01/22/24  Eben Reyes BROCKS, MD                                                                                                                                    Past Surgical History Past Surgical History:  Procedure Laterality Date   APPLICATION OF CRANIAL NAVIGATION N/A 01/07/2020   Procedure: APPLICATION OF CRANIAL NAVIGATION;  Surgeon: Gillie Duncans, MD;  Location: [facility] OR;  Service: Neurosurgery;  Laterality: N/A;   CRANIOTOMY Right 01/07/2020   Procedure: RIGHT CRANIOTOMY FOR TUMOR RESECTION;  Surgeon: Gillie Duncans, MD;  Location: [facility] OR;  Service: Neurosurgery;  Laterality: Right;  rm 21   ENDOBRONCHIAL ULTRASOUND N/A 12/21/2019   Procedure: ENDOBRONCHIAL ULTRASOUND;  Surgeon: Neda Jennet LABOR, MD;  Location: [facility] ENDOSCOPY;  Service: Pulmonary;  Laterality: N/A;   FINE NEEDLE ASPIRATION  12/21/2019   Procedure: FINE NEEDLE ASPIRATION (FNA) LINEAR;  Surgeon: Neda Jennet LABOR, MD;  Location: [facility] ENDOSCOPY;  Service: Pulmonary;;   IR IMAGING GUIDED PORT INSERTION  03/03/2020   VIDEO BRONCHOSCOPY N/A 12/21/2019   Procedure: VIDEO BRONCHOSCOPY WITHOUT FLUORO;  Surgeon: Neda Jennet LABOR, MD;  Location: [facility] ENDOSCOPY;  Service: Pulmonary;  Laterality: N/A;   Family History Family History  Problem Relation Age of Onset   Heart disease Mother        CABG   Hypertension Mother    Kidney disease Sister    Hypertension Sister    Diabetes Brother    Heart attack Brother    Kidney disease Brother        KIDNEY TRANSPLANT   Healthy Daughter    Healthy Daughter    Healthy Son     Social History Social History   Tobacco Use   Smoking status: Former    Current packs/day: 0.00    Average packs/day: 1 pack/day for 49.4 years (49.4 ttl pk-yrs)    Types:  Cigarettes    Start date: 08/07/1970    Quit date: 12/15/2019    Years since quitting: 4.0   Smokeless tobacco: Never  Vaping Use   Vaping status: Never Used  Substance Use Topics   Alcohol use: Not Currently   Drug use: Never   Allergies Jardiance  [empagliflozin ] and Penicillins  Review of Systems A thorough review of systems was obtained and all systems are negative except as noted in the HPI and PMH.  Physical Exam Vital Signs  I have reviewed the triage vital signs BP (!) 144/68   Pulse 81   Temp 97.6 F (36.4 C) (Oral)   Resp 13   Ht 5' 10 (1.778 m)   Wt 74 kg   SpO2 100%   BMI 23.41 kg/m  Physical Exam Vitals and nursing note reviewed.  Constitutional:      General: He is not in acute distress.    Appearance: He is well-developed. He is not ill-appearing.  HENT:     Head: Normocephalic and atraumatic.     Right Ear: External ear normal.     Left Ear: External ear normal.     Mouth/Throat:     Mouth: Mucous membranes are dry.  Eyes:     General: No scleral icterus. Cardiovascular:     Rate and Rhythm: Normal rate and regular rhythm.  Pulmonary:     Effort: Pulmonary effort is normal. No respiratory distress.     Breath sounds: No stridor.  Abdominal:     General: Abdomen is flat.     Palpations: Abdomen is soft.     Tenderness: There is no abdominal tenderness. There is no guarding or rebound.  Musculoskeletal:     Cervical back: No rigidity.     Right lower leg: No edema.     Left lower leg: No edema.  Skin:    General: Skin is warm and dry.     Capillary Refill: Capillary refill takes less than 2 seconds.  Neurological:     Mental Status: He is alert.  Psychiatric:        Mood and Affect: Mood normal.        Behavior: Behavior normal.     ED Results and Treatments Labs (all labs ordered are listed, but only abnormal results are displayed) Labs Reviewed  COMPREHENSIVE METABOLIC PANEL WITH GFR - Abnormal; Notable for the following components:       Result Value   Sodium 126 (*)    Chloride 93 (*)    CO2 16 (*)    Glucose, Bld 668 (*)    BUN 31 (*)    Total Protein 8.3 (*)    Anion gap 17 (*)    All other components within normal limits  CBC - Abnormal; Notable for the following components:   Platelets 447 (*)    All other components within normal limits  URINALYSIS, ROUTINE W REFLEX MICROSCOPIC - Abnormal; Notable for the following components:   Color, Urine STRAW (*)    Glucose, UA >=500 (*)    Ketones, ur 5 (*)    All other components within normal limits  BETA-HYDROXYBUTYRIC ACID - Abnormal; Notable for the following components:   Beta-Hydroxybutyric Acid 4.43 (*)    All other components within normal limits  BASIC METABOLIC PANEL WITH GFR - Abnormal; Notable for the following components:   Sodium 130 (*)    Potassium 5.4 (*)    Chloride 95 (*)    CO2 17 (*)    Glucose, Bld 573 (*)    BUN 28 (*)    Calcium  10.4 (*)    Anion gap 18 (*)    All other components within normal limits  OSMOLALITY - Abnormal; Notable for the following components:   Osmolality 318 (*)    All other components within normal limits  I-STAT VENOUS BLOOD GAS, ED - Abnormal; Notable for the following components:   pCO2, Ven 29.1 (*)    pO2, Ven 53 (*)  Bicarbonate 18.5 (*)    TCO2 19 (*)    Acid-base deficit 5.0 (*)    Sodium 129 (*)    All other components within normal limits  CBG MONITORING, ED - Abnormal; Notable for the following components:   Glucose-Capillary >600 (*)    All other components within normal limits  CBG MONITORING, ED - Abnormal; Notable for the following components:   Glucose-Capillary 510 (*)    All other components within normal limits  CBG MONITORING, ED - Abnormal; Notable for the following components:   Glucose-Capillary 405 (*)    All other components within normal limits  CBG MONITORING, ED - Abnormal; Notable for the following components:   Glucose-Capillary 323 (*)    All other components within  normal limits  LIPASE, BLOOD  BASIC METABOLIC PANEL WITH GFR  BASIC METABOLIC PANEL WITH GFR  BASIC METABOLIC PANEL WITH GFR  HCV RNA QUANT  HEMOGLOBIN A1C                                                                                                                          Radiology CT ABDOMEN PELVIS W CONTRAST Result Date: 12/28/2023 CLINICAL DATA:  Chest and abdominal pain. History of non-small cell cancer. EXAM: CT ANGIOGRAPHY CHEST CT ABDOMEN AND PELVIS WITH CONTRAST TECHNIQUE: Multidetector CT imaging of the chest was performed using the standard protocol during bolus administration of intravenous contrast. Multiplanar CT image reconstructions and MIPs were obtained to evaluate the vascular anatomy. Multidetector CT imaging of the abdomen and pelvis was performed using the standard protocol during bolus administration of intravenous contrast. RADIATION DOSE REDUCTION: This exam was performed according to the departmental dose-optimization program which includes automated exposure control, adjustment of the mA and/or kV according to patient size and/or use of iterative reconstruction technique. CONTRAST:  75mL OMNIPAQUE  IOHEXOL  350 MG/ML SOLN COMPARISON:  CT scan 09/10/2023 FINDINGS: CTA CHEST FINDINGS Cardiovascular: The heart is normal in size. No pericardial effusion. The aorta is normal in caliber. No dissection or atherosclerotic calcification. Scattered three-vessel coronary artery calcifications are noted. The pulmonary arterial tree is well opacified. No filling defects to suggest pulmonary embolism. Mediastinum/Nodes: Scattered mediastinal and hilar lymph nodes are stable. No mass or overt adenopathy. No new or progressive changes. The esophagus is grossly normal. Lungs/Pleura: Stable underlying emphysematous changes and areas of pulmonary scarring. No infiltrates, edema or effusions. No worrisome pulmonary lesions or pulmonary nodules. Musculoskeletal: No supraclavicular or axillary  adenopathy. The right IJ Port-A-Cath is stable. No worrisome lytic sclerotic bone lesions. Review of the MIP images confirms the above findings. CT ABDOMEN and PELVIS FINDINGS Hepatobiliary: Stable fatty change near the falciform ligament. No worrisome hepatic lesions or intrahepatic biliary dilatation. The gallbladder is normal. No common bile duct dilatation. Pancreas: Progressive diffuse pancreatic atrophy but no mass, inflammation or ductal dilatation. Spleen: Normal in size without focal abnormality. Adrenals/Urinary Tract: The adrenal glands are normal. No renal lesions or hydronephrosis. The bladder is unremarkable. Stomach/Bowel: The stomach, duodenum,  small bowel and colon are grossly normal without oral contrast. No inflammatory changes, mass lesions or obstructive findings. The appendix is normal. Stable colonic diverticulosis. Vascular/Lymphatic: The aorta is normal in caliber. No dissection. Stable age advanced atherosclerotic calcifications. The branch vessels are patent. The major venous structures are patent. No mesenteric or retroperitoneal mass or adenopathy. Small scattered lymph nodes are noted. Reproductive: Prostate is unremarkable. Other: No pelvic mass or adenopathy. No free pelvic fluid collections. No inguinal mass or adenopathy. No abdominal wall hernia or subcutaneous lesions. Musculoskeletal: No significant bony findings. Review of the MIP images confirms the above findings. IMPRESSION: 1. No CT findings for pulmonary embolism. 2. Normal caliber thoracic aorta.  No dissection. 3. Stable emphysematous changes and areas of pulmonary scarring. 4. No acute abdominal/pelvic findings, mass lesions or adenopathy. 5. Progressive diffuse pancreatic atrophy but no mass, inflammation or ductal dilatation. 6. Stable age advanced atherosclerotic calcifications involving the aorta and branch vessels including the coronary arteries. Aortic Atherosclerosis (ICD10-I70.0) and Emphysema (ICD10-J43.9).  Electronically Signed   By: MYRTIS Stammer M.D.   On: 12/28/2023 14:33   CT Angio Chest PE W and/or Wo Contrast Result Date: 12/28/2023 CLINICAL DATA:  Chest and abdominal pain. History of non-small cell cancer. EXAM: CT ANGIOGRAPHY CHEST CT ABDOMEN AND PELVIS WITH CONTRAST TECHNIQUE: Multidetector CT imaging of the chest was performed using the standard protocol during bolus administration of intravenous contrast. Multiplanar CT image reconstructions and MIPs were obtained to evaluate the vascular anatomy. Multidetector CT imaging of the abdomen and pelvis was performed using the standard protocol during bolus administration of intravenous contrast. RADIATION DOSE REDUCTION: This exam was performed according to the departmental dose-optimization program which includes automated exposure control, adjustment of the mA and/or kV according to patient size and/or use of iterative reconstruction technique. CONTRAST:  75mL OMNIPAQUE  IOHEXOL  350 MG/ML SOLN COMPARISON:  CT scan 09/10/2023 FINDINGS: CTA CHEST FINDINGS Cardiovascular: The heart is normal in size. No pericardial effusion. The aorta is normal in caliber. No dissection or atherosclerotic calcification. Scattered three-vessel coronary artery calcifications are noted. The pulmonary arterial tree is well opacified. No filling defects to suggest pulmonary embolism. Mediastinum/Nodes: Scattered mediastinal and hilar lymph nodes are stable. No mass or overt adenopathy. No new or progressive changes. The esophagus is grossly normal. Lungs/Pleura: Stable underlying emphysematous changes and areas of pulmonary scarring. No infiltrates, edema or effusions. No worrisome pulmonary lesions or pulmonary nodules. Musculoskeletal: No supraclavicular or axillary adenopathy. The right IJ Port-A-Cath is stable. No worrisome lytic sclerotic bone lesions. Review of the MIP images confirms the above findings. CT ABDOMEN and PELVIS FINDINGS Hepatobiliary: Stable fatty change near  the falciform ligament. No worrisome hepatic lesions or intrahepatic biliary dilatation. The gallbladder is normal. No common bile duct dilatation. Pancreas: Progressive diffuse pancreatic atrophy but no mass, inflammation or ductal dilatation. Spleen: Normal in size without focal abnormality. Adrenals/Urinary Tract: The adrenal glands are normal. No renal lesions or hydronephrosis. The bladder is unremarkable. Stomach/Bowel: The stomach, duodenum, small bowel and colon are grossly normal without oral contrast. No inflammatory changes, mass lesions or obstructive findings. The appendix is normal. Stable colonic diverticulosis. Vascular/Lymphatic: The aorta is normal in caliber. No dissection. Stable age advanced atherosclerotic calcifications. The branch vessels are patent. The major venous structures are patent. No mesenteric or retroperitoneal mass or adenopathy. Small scattered lymph nodes are noted. Reproductive: Prostate is unremarkable. Other: No pelvic mass or adenopathy. No free pelvic fluid collections. No inguinal mass or adenopathy. No abdominal wall hernia or subcutaneous lesions.  Musculoskeletal: No significant bony findings. Review of the MIP images confirms the above findings. IMPRESSION: 1. No CT findings for pulmonary embolism. 2. Normal caliber thoracic aorta.  No dissection. 3. Stable emphysematous changes and areas of pulmonary scarring. 4. No acute abdominal/pelvic findings, mass lesions or adenopathy. 5. Progressive diffuse pancreatic atrophy but no mass, inflammation or ductal dilatation. 6. Stable age advanced atherosclerotic calcifications involving the aorta and branch vessels including the coronary arteries. Aortic Atherosclerosis (ICD10-I70.0) and Emphysema (ICD10-J43.9). Electronically Signed   By: MYRTIS Stammer M.D.   On: 12/28/2023 14:33   CT Head Wo Contrast Result Date: 12/28/2023 CLINICAL DATA:  Headache, increasing frequency or severity hx cancer EXAM: CT HEAD WITHOUT CONTRAST  TECHNIQUE: Contiguous axial images were obtained from the base of the skull through the vertex without intravenous contrast. RADIATION DOSE REDUCTION: This exam was performed according to the departmental dose-optimization program which includes automated exposure control, adjustment of the mA and/or kV according to patient size and/or use of iterative reconstruction technique. COMPARISON:  MRI head 03/20/2019 FINDINGS: Brain: Patchy and confluent areas of decreased attenuation are noted throughout the deep and periventricular white matter of the cerebral hemispheres bilaterally, compatible with chronic microvascular ischemic disease. No evidence of large-territorial acute infarction. No parenchymal hemorrhage. No mass lesion. No extra-axial collection. No mass effect or midline shift. No hydrocephalus. Basilar cisterns are patent. Vascular: No hyperdense vessel. Atherosclerotic calcifications are present within the cavernous internal carotid arteries. Skull: No acute fracture or focal lesion.  Prior right burr holes. Sinuses/Orbits: Paranasal sinuses and mastoid air cells are clear. The orbits are unremarkable. Other: None. IMPRESSION: No acute intracranial abnormality. Electronically Signed   By: Morgane  Naveau M.D.   On: 12/28/2023 14:20    Pertinent labs & imaging results that were available during my care of the patient were reviewed by me and considered in my medical decision making (see MDM for details).  Medications Ordered in ED Medications  ondansetron  (ZOFRAN ) injection 4 mg (4 mg Intravenous Not Given 12/28/23 1409)  insulin  regular, human (MYXREDLIN ) 100 units/ 100 mL infusion (13 Units/hr Intravenous New Bag/Given 12/28/23 1428)  lactated ringers  infusion ( Intravenous New Bag/Given 12/28/23 1549)  dextrose  5 % in lactated ringers  infusion (has no administration in time range)  dextrose  50 % solution 0-50 mL (has no administration in time range)  potassium chloride  10 mEq in 100 mL IVPB (10  mEq Intravenous New Bag/Given 12/28/23 1525)  sodium chloride  flush (NS) 0.9 % injection 10-40 mL (has no administration in time range)  sodium chloride  flush (NS) 0.9 % injection 10-40 mL (has no administration in time range)  Chlorhexidine  Gluconate Cloth 2 % PADS 6 each (has no administration in time range)  enoxaparin  (LOVENOX ) injection 40 mg (has no administration in time range)  acetaminophen  (TYLENOL ) tablet 650 mg (has no administration in time range)    Or  acetaminophen  (TYLENOL ) suppository 650 mg (has no administration in time range)  aspirin  EC tablet 81 mg (has no administration in time range)  atorvastatin  (LIPITOR) tablet 20 mg (has no administration in time range)  isosorbide  mononitrate (IMDUR ) 24 hr tablet 60 mg (has no administration in time range)  levETIRAcetam  (KEPPRA ) tablet 750 mg (has no administration in time range)  losartan -hydrochlorothiazide  (HYZAAR) 50-12.5 MG per tablet 1 tablet (has no administration in time range)  melatonin tablet 5 mg (has no administration in time range)  metoprolol  succinate (TOPROL -XL) 24 hr tablet 12.5 mg (has no administration in time range)  nortriptyline  (PAMELOR ) capsule 25  mg (has no administration in time range)  lactated ringers  bolus 2,000 mL (0 mLs Intravenous Stopped 12/28/23 1557)  iohexol  (OMNIPAQUE ) 350 MG/ML injection 75 mL (75 mLs Intravenous Contrast Given 12/28/23 1406)                                                                                                                                     Procedures .Critical Care  Performed by: Elnor Jayson LABOR, DO Authorized by: Elnor Jayson LABOR, DO   Critical care provider statement:    Critical care time (minutes):  30   Critical care time was exclusive of:  Separately billable procedures and treating other patients   Critical care was necessary to treat or prevent imminent or life-threatening deterioration of the following conditions:  Endocrine crisis   Critical care  was time spent personally by me on the following activities:  Development of treatment plan with patient or surrogate, discussions with consultants, evaluation of patient's response to treatment, examination of patient, ordering and review of laboratory studies, ordering and review of radiographic studies, ordering and performing treatments and interventions, pulse oximetry, re-evaluation of patient's condition, review of old charts and obtaining history from patient or surrogate   Care discussed with: admitting provider     (including critical care time)  Medical Decision Making / ED Course    Medical Decision Making:    Jayzen Paver. is a 68 y.o. male with past medical history as below, significant for metastatic lung cancer to his brain, DM on metformin , chronic cough, hypertension, epilepsy who presents to the ED with complaint of abdominal pain, dyspnea. The complaint involves an extensive differential diagnosis and also carries with it a high risk of complications and morbidity.  Serious etiology was considered. Ddx includes but is not limited to: Differential diagnosis includes but is not exclusive to acute cholecystitis, intrathoracic causes for epigastric abdominal pain, gastritis, duodenitis, pancreatitis, small bowel or large bowel obstruction, abdominal aortic aneurysm, hernia, gastritis, PE, ACS, pneumonia, metastatic disease etc.   Complete initial physical exam performed, notably the patient was in no acute distress, HDS.    Reviewed and confirmed nursing documentation for past medical history, family history, social history.  Vital signs reviewed.    Hyperglycemia> - pH is normal, anion gap 17, glucose 668, bicarb 16 - Consider possible HHS, start Endo tool - Patient appears dry on exam, give 2L LR - glucose improving on endotool - admit hyperglycemia  Abdominal pain, nausea and vomiting> - History of metastatic lung cancer - Check CTAP >> stable  Intermittent chest  pain, dyspnea> - History of lung cancer, no history of VTE, currently asymptomatic.  Check CT chest - stable CT   Admit pt to IMTS                      Additional history obtained: -Additional history obtained from na -External records from outside source obtained  and reviewed including: Chart review including previous notes, labs, imaging, consultation notes including  Primary care documentation Home meds   Lab Tests: -I ordered, reviewed, and interpreted labs.   The pertinent results include:   Labs Reviewed  COMPREHENSIVE METABOLIC PANEL WITH GFR - Abnormal; Notable for the following components:      Result Value   Sodium 126 (*)    Chloride 93 (*)    CO2 16 (*)    Glucose, Bld 668 (*)    BUN 31 (*)    Total Protein 8.3 (*)    Anion gap 17 (*)    All other components within normal limits  CBC - Abnormal; Notable for the following components:   Platelets 447 (*)    All other components within normal limits  URINALYSIS, ROUTINE W REFLEX MICROSCOPIC - Abnormal; Notable for the following components:   Color, Urine STRAW (*)    Glucose, UA >=500 (*)    Ketones, ur 5 (*)    All other components within normal limits  BETA-HYDROXYBUTYRIC ACID - Abnormal; Notable for the following components:   Beta-Hydroxybutyric Acid 4.43 (*)    All other components within normal limits  BASIC METABOLIC PANEL WITH GFR - Abnormal; Notable for the following components:   Sodium 130 (*)    Potassium 5.4 (*)    Chloride 95 (*)    CO2 17 (*)    Glucose, Bld 573 (*)    BUN 28 (*)    Calcium  10.4 (*)    Anion gap 18 (*)    All other components within normal limits  OSMOLALITY - Abnormal; Notable for the following components:   Osmolality 318 (*)    All other components within normal limits  I-STAT VENOUS BLOOD GAS, ED - Abnormal; Notable for the following components:   pCO2, Ven 29.1 (*)    pO2, Ven 53 (*)    Bicarbonate 18.5 (*)    TCO2 19 (*)    Acid-base deficit 5.0  (*)    Sodium 129 (*)    All other components within normal limits  CBG MONITORING, ED - Abnormal; Notable for the following components:   Glucose-Capillary >600 (*)    All other components within normal limits  CBG MONITORING, ED - Abnormal; Notable for the following components:   Glucose-Capillary 510 (*)    All other components within normal limits  CBG MONITORING, ED - Abnormal; Notable for the following components:   Glucose-Capillary 405 (*)    All other components within normal limits  CBG MONITORING, ED - Abnormal; Notable for the following components:   Glucose-Capillary 323 (*)    All other components within normal limits  LIPASE, BLOOD  BASIC METABOLIC PANEL WITH GFR  BASIC METABOLIC PANEL WITH GFR  BASIC METABOLIC PANEL WITH GFR  HCV RNA QUANT  HEMOGLOBIN A1C    Notable for as above  EKG   EKG Interpretation Date/Time:    Ventricular Rate:    PR Interval:    QRS Duration:    QT Interval:    QTC Calculation:   R Axis:      Text Interpretation:           Imaging Studies ordered: I ordered imaging studies including ctchest, cta/p I independently visualized the following imaging with scope of interpretation limited to determining acute life threatening conditions related to emergency care; findings noted above I agree with the radiologist interpretation If any imaging was obtained with contrast I closely monitored patient for any possible adverse  reaction a/w contrast administration in the emergency department   Medicines ordered and prescription drug management: Meds ordered this encounter  Medications   DISCONTD: lactated ringers  bolus 1,000 mL   ondansetron  (ZOFRAN ) injection 4 mg   lactated ringers  bolus 2,000 mL   insulin  regular, human (MYXREDLIN ) 100 units/ 100 mL infusion    EndoTool Goal Range::   140-180    Type of Diabetes:   Type 2    Mode of Therapy:   ENDOX2 for HHS    Start Method:   EndoTool to calculate   lactated ringers  infusion    dextrose  5 % in lactated ringers  infusion   dextrose  50 % solution 0-50 mL   potassium chloride  10 mEq in 100 mL IVPB   iohexol  (OMNIPAQUE ) 350 MG/ML injection 75 mL   sodium chloride  flush (NS) 0.9 % injection 10-40 mL   sodium chloride  flush (NS) 0.9 % injection 10-40 mL   Chlorhexidine  Gluconate Cloth 2 % PADS 6 each   enoxaparin  (LOVENOX ) injection 40 mg   OR Linked Order Group    acetaminophen  (TYLENOL ) tablet 650 mg    acetaminophen  (TYLENOL ) suppository 650 mg   aspirin  EC tablet 81 mg   atorvastatin  (LIPITOR) tablet 20 mg   isosorbide  mononitrate (IMDUR ) 24 hr tablet 60 mg   levETIRAcetam  (KEPPRA ) tablet 750 mg   losartan -hydrochlorothiazide  (HYZAAR) 50-12.5 MG per tablet 1 tablet   melatonin tablet 5 mg   metoprolol  succinate (TOPROL -XL) 24 hr tablet 12.5 mg   nortriptyline  (PAMELOR ) capsule 25 mg    -I have reviewed the patients home medicines and have made adjustments as needed   Consultations Obtained: I requested consultation with the IMTS,  and discussed lab and imaging findings as well as pertinent plan    Cardiac Monitoring: The patient was maintained on a cardiac monitor.  I personally viewed and interpreted the cardiac monitored which showed an underlying rhythm of: nsr Continuous pulse oximetry interpreted by myself, 100% on ra.    Social Determinants of Health:  Diagnosis or treatment significantly limited by social determinants of health: former smoker   Reevaluation: After the interventions noted above, I reevaluated the patient and found that they have improved  Co morbidities that complicate the patient evaluation  Past Medical History:  Diagnosis Date   Brain mass    brain cancer   Bursitis of right hip    Chest pain 08/12/2018   Chronic cough    Dependence on nicotine  from cigarettes    Diabetes mellitus without complication (HCC)    Dysuria 04/03/2021   Essential hypertension 07/28/2018   Frequent headaches 06/27/2020   Metastatic lung  cancer (metastasis from lung to other site) [facility])    nscl ca dx'd 12/2019   Polyarthralgia 06/15/2020   Seizures (HCC)    Viral illness 03/23/2019      Dispostion: Disposition decision including need for hospitalization was considered, and patient admitted to the hospital.    Final Clinical Impression(s) / ED Diagnoses Final diagnoses:  Hyperglycemic crisis in diabetes mellitus (HCC)        Elnor Jayson LABOR, DO 12/28/23 1623

## 2023-12-28 NOTE — Discharge Instructions (Signed)
 Patient left AMA and not able to give instructions

## 2023-12-28 NOTE — ED Notes (Signed)
 Pt called x3 for vitals recheck. Pt could not be found.

## 2023-12-28 NOTE — ED Provider Triage Note (Signed)
 Emergency Medicine Provider Triage Evaluation Note  Jared Shea. , a 68 y.o. male  was evaluated in triage.  Pt complains of abdominal pain.  Reports ongoing abdominal pain for the last 2 weeks with decreased appetite but no vomiting or diarrhea.  Recent history of adenocarcinoma with metastatic spread.  Denies fevers but does report some chills with initial onset of symptoms.  Review of Systems  Positive: As above Negative: As above  Physical Exam  BP (!) 142/74 (BP Location: Right Arm)   Pulse 100   Temp 97.6 F (36.4 C) (Oral)   Resp 20   Ht 5' 10 (1.778 m)   Wt 74 kg   SpO2 100%   BMI 23.41 kg/m  Gen:   Awake, no distress   Resp:  Normal effort  MSK:   Moves extremities without difficulty  Other:    Medical Decision Making  Medically screening exam initiated at 11:45 AM.  Appropriate orders placed.  [name] was informed that the remainder of the evaluation will be completed by another provider, this initial triage assessment does not replace that evaluation, and the importance of remaining in the ED until their evaluation is complete.    Levada Bowersox A, PA-C 12/28/23 1157

## 2023-12-28 NOTE — ED Provider Triage Note (Signed)
 Emergency Medicine Provider Triage Evaluation Note  Jared Shea. , Shea 68 y.o. male  was evaluated in triage.  Pt complains of abdominal pain. Patient brought in by sister after finding that he had eloped after being admitted to IMTS. Patient's sister reports concerns for slurred speech. Unknown last known normal per family as patient was here earlier.  Review of Systems  Positive: As above  Negative: As above  Physical Exam  BP 125/82 (BP Location: Right Arm)   Pulse (!) 102   Temp 98.3 F (36.8 C) (Oral)   Resp 19   SpO2 97%  Gen:   Awake, no distress   Resp:  Normal effort  MSK:   Moves extremities without difficulty  Other:  No acute neurological deficit, facial droop, slurred speech, or unilateral weakness or numbness  Medical Decision Making  Medically screening exam initiated at 7:31 PM.  Appropriate orders placed.  Jared Shea. was informed that the remainder of the evaluation will be completed by another provider, this initial triage assessment does not replace that evaluation, and the importance of remaining in the ED until their evaluation is complete.     Jared Trim A, PA-C 12/28/23 1933

## 2023-12-28 NOTE — H&P (Addendum)
 Date: 12/28/2023               Patient Name:  Jared Shea. MRN: [id number]  DOB: 14-Oct-1955 Age / Sex: 68 y.o., male   PCP: Elicia Sharper, DO         Medical Service: Internal Medicine Teaching Service         Attending Physician: Dr. Trudy, Mliss Dragon, MD      First Contact: Dr. Armando Rossetti, MD    Second Contact: Dr. Libby Blanch, DO          After Hours (After 5p/  First Contact Pager: [phone]  weekends / holidays): Second Contact Pager: [phone]   SUBJECTIVE   Chief Complaint: Abdominal pain, nausea, and vomiting   History of Present Illness:  This is a 68 year old male with past medical history of metastatic lung cancer to his brain, diabetes mellitus, chronic cough, hypertension, epilepsy who presents to the emergency department concerns abdominal pain.  When evaluating patient, reports that he does not want anyone to talk to him.  He was not answering her questions.  He wanted to be left alone.  He told us  to come back later.  He will only state that he had abdominal pain and left arm pain.  Otherwise he did not want to answer any other questions.  I tried prompting him, but he did not want to be evaluated at this time.  ED Course: In the emergency department initial vital signs showed temperature 97.6, pulse 100, respirations 20, blood pressure 142/74, satting at 100% on room air.  Initial labs showed elevated glucose at 668 with decreased bicarb, and elevated anion gap.  Urinalysis showed ketones and pH was 7.4.  CT head, CT abdomen pelvis, and CTA chest PE rule out with no obvious acute findings.  With concern for DKA, patient was started on Endo tool and IMTS was consulted for admission.  Past Medical History Past Medical History:  Diagnosis Date   Brain mass    brain cancer   Bursitis of right hip    Chest pain 08/12/2018   Chronic cough    Dependence on nicotine  from cigarettes    Diabetes mellitus without complication (HCC)    Dysuria 04/03/2021    Essential hypertension 07/28/2018   Frequent headaches 06/27/2020   Metastatic lung cancer (metastasis from lung to other site) [facility])    nscl ca dx'd 12/2019   Polyarthralgia 06/15/2020   Seizures (HCC)    Viral illness 03/23/2019     Meds:  Per charting: Aspirin  81 mg daily Lipitor 20 mg daily Imdur  60 mg daily Keppra  750 mg twice daily Losartan -HCTZ 50-12.5 mg daily Melatonin 5 mg daily Metformin  500 mg twice daily Metoprolol  succinate 12.5 mg nightly Nortriptyline  25 mg twice daily Epclusa  400-100 mg daily-unclear if patient taking  Unclear if patient has been adherent to his medications or not  Past Surgical History  Past Surgical History:  Procedure Laterality Date   APPLICATION OF CRANIAL NAVIGATION N/A 01/07/2020   Procedure: APPLICATION OF CRANIAL NAVIGATION;  Surgeon: Gillie Duncans, MD;  Location: [facility] OR;  Service: Neurosurgery;  Laterality: N/A;   CRANIOTOMY Right 01/07/2020   Procedure: RIGHT CRANIOTOMY FOR TUMOR RESECTION;  Surgeon: Gillie Duncans, MD;  Location: [facility] OR;  Service: Neurosurgery;  Laterality: Right;  rm 21   ENDOBRONCHIAL ULTRASOUND N/A 12/21/2019   Procedure: ENDOBRONCHIAL ULTRASOUND;  Surgeon: Neda Jennet LABOR, MD;  Location: [facility] ENDOSCOPY;  Service: Pulmonary;  Laterality: N/A;   FINE NEEDLE ASPIRATION  12/21/2019  Procedure: FINE NEEDLE ASPIRATION (FNA) LINEAR;  Surgeon: Neda Jennet LABOR, MD;  Location: [facility] ENDOSCOPY;  Service: Pulmonary;;   IR IMAGING GUIDED PORT INSERTION  03/03/2020   VIDEO BRONCHOSCOPY N/A 12/21/2019   Procedure: VIDEO BRONCHOSCOPY WITHOUT FLUORO;  Surgeon: Neda Jennet LABOR, MD;  Location: [facility] ENDOSCOPY;  Service: Pulmonary;  Laterality: N/A;    Social:  Unable to obtain full history secondary to patient mood PCP: Ozell Nearing, DO  Family History: Unable to obtain secondary to patient mood  Allergies: Allergies as of 12/28/2023 - Review Complete 12/28/2023  Allergen Reaction Noted   Jardiance  [empagliflozin ]   09/20/2022   Penicillins Rash 06/23/2018    Review of Systems: A complete ROS was negative except as per HPI.   OBJECTIVE:   Physical Exam: Blood pressure (!) 144/68, pulse 81, temperature 97.6 F (36.4 C), temperature source Oral, resp. rate 13, height 5' 10 (1.778 m), weight 74 kg, SpO2 100%.  Constitutional: Ill-appearing, irritated HENT: normocephalic atraumatic Pulmonary/Chest: normal work of breathing on room air  Unable to obtain full exam secondary to patient deferring exam  Labs: CBC    Component Value Date/Time   WBC 8.7 12/28/2023 0959   RBC 4.89 12/28/2023 0959   HGB 15.0 12/28/2023 1208   HGB 11.7 (L) 05/16/2023 1028   HCT 44.0 12/28/2023 1208   PLT 447 (H) 12/28/2023 0959   PLT 390 05/16/2023 1028   MCV 85.7 12/28/2023 0959   MCH 29.4 12/28/2023 0959   MCHC 34.4 12/28/2023 0959   RDW 13.0 12/28/2023 0959   LYMPHSABS 1.2 05/16/2023 1028   MONOABS 0.5 05/16/2023 1028   EOSABS 0.1 05/16/2023 1028   BASOSABS 0.0 05/16/2023 1028     CMP     Component Value Date/Time   NA 130 (L) 12/28/2023 1312   NA 143 09/05/2023 1015   K 5.4 (H) 12/28/2023 1312   CL 95 (L) 12/28/2023 1312   CO2 17 (L) 12/28/2023 1312   GLUCOSE 573 (HH) 12/28/2023 1312   BUN 28 (H) 12/28/2023 1312   BUN 16 09/05/2023 1015   CREATININE 1.11 12/28/2023 1312   CREATININE 0.72 05/16/2023 1028   CALCIUM  10.4 (H) 12/28/2023 1312   PROT 8.3 (H) 12/28/2023 0959   PROT 8.3 02/01/2023 0952   ALBUMIN 4.4 12/28/2023 0959   ALBUMIN 4.9 02/01/2023 0952   AST 15 12/28/2023 0959   AST 19 05/16/2023 1028   ALT 13 12/28/2023 0959   ALT 17 05/16/2023 1028   ALKPHOS 78 12/28/2023 0959   BILITOT 1.1 12/28/2023 0959   BILITOT 0.6 05/16/2023 1028   GFRNONAA >60 12/28/2023 1312   GFRNONAA >60 05/16/2023 1028   GFRAA >60 02/15/2020 1000    Imaging:  CT head without contrast: IMPRESSION: No acute intracranial abnormality.  CT abdomen pelvis with contrast, CT angio chest PE IMPRESSION: 1.  No CT findings for pulmonary embolism. 2. Normal caliber thoracic aorta.  No dissection. 3. Stable emphysematous changes and areas of pulmonary scarring. 4. No acute abdominal/pelvic findings, mass lesions or adenopathy. 5. Progressive diffuse pancreatic atrophy but no mass, inflammation or ductal dilatation. 6. Stable age advanced atherosclerotic calcifications involving the aorta and branch vessels including the coronary arteries.  ASSESSMENT & PLAN:   Assessment & Plan by Problem: Principal Problem:   DKA (diabetic ketoacidosis) (HCC) Active Problems:   Partial symptomatic epilepsy with complex partial seizures, not intractable, without status epilepticus (HCC)   Non-small cell carcinoma of lung, stage 4 w/ mets to brain (HCC)   Cardiomyopathy, unspecified (HCC)  Hyperlipidemia   Hepatitis C   Jared Shea. is a 68 y.o. person living with a history of diabetes mellitus who presents with concerns of abdominal pain, nausea, vomiting and admitted for further evaluation management of DKA.  #Diabetes mellitus in DKA Patient has past medical history of diabetes.  There is some question on whether this is diabetes secondary to pancreatic insufficiency or type 2 diabetes from metabolic issues.  Patient did have charting noting that he may have developed diabetes due to immunotherapy when he has been treated in the past for his lung cancer.  He has been treated well with A1c 6.1 about 3 months ago.  He now has developed nausea, vomiting, and abdominal pain.  CT findings showing progressive diffuse pancreatic atrophy, which likely is contributing to this picture.  He likely needs basal insulin  moving forward.  For now, we will continue with Endo tool. - Continue with Endo tool - Continue D5 LR once blood sugars below 250 - Can transition off Endo tool to subcutaneous insulin  once anion gap closes x 2 - Monitor electrolytes with every 4 BMPs - Replete electrolytes - Hold home metformin  -  Follow-up A1c - Consult diabetic educator  #History of small cell lung cancer with brain mets Patient follows with oncology.  He is currently under observation.  He last saw his oncologist on 09/26/2023.  He has completed weekly chemoradiation and carboplatin  for 5 cycles.  He also has completed immunotherapy with Libtayo  which likely contributed to pancreatic atrophy.  His last treatment was 04/10/2022.  Plans for him to get a restaging scan CT scan in November. - Continue observation - No acute concerns during hospitalization  #HFmrEF #CAD Most recent echocardiogram in 10/19/2022 showing ejection fraction of 50 to 55%.  Patient does have grade 1 diastolic dysfunction.  Patient has mitral regurg.  No acute concern for exacerbation at this time. - Continue home metoprolol  succinate 12.5 mg daily - Hold home Imdur  60 mg daily - CT scan did show evidence of atherosclerotic calcifications involving coronary arteries - Continue home aspirin  81 mg daily - Continue Lipitor 20 mg daily  #Hypertension Patient has a past medical history of hypertension.  Home medications include Imdur  60 mg daily, losartan -HCTZ 50-12.5 mg daily, metoprolol  succinate 12.5 mg nightly.  Blood pressure measuring into the 140s systolics. - Will slowly resume home medications - Resume home metoprolol  succinate 12.5 mg daily - Resume home losartan -HCTZ 50-12.5 mg daily - Hold home Imdur  60 mg daily  #History of hepatitis C Patient has been treated with Epclusa  400-100 mg daily, but unclear if patient has been adherent to this.  Per charting, patient has been holding this medication and not taking it.  Per specialty pharmacy, patient cannot resume this medication until patient is able to get RNA testing.  Most recent RNA testing showing 803,000 viral load in July 25, 2023. - Repeat viral load - Encourage patient to follow-up outpatient with ID, he sees Dr. Eben  #History of seizures No acute concerns for seizures  during this hospitalization. - Resume home Keppra  750 mg twice daily  #History of headaches -Continue home nortriptyline  25 mg twice daily  #Insomnia -Resume home melatonin 5 mg nightly  Diet: NPO VTE: Enoxaparin  IVF: D5LR Code: Full  Prior to Admission Living Arrangement: Home, living At home Anticipated Discharge Location: Home Barriers to Discharge: Clinical improvement   Dispo: Admit patient to Inpatient with expected length of stay greater than 2 midnights.  Signed: Gene Colee, MD Internal Medicine Resident  PGY-1  Please page on call intern or resident: First contact: [phone] If no answer in 15 minutes, please contact senior pager at [phone]

## 2023-12-29 ENCOUNTER — Inpatient Hospital Stay (HOSPITAL_COMMUNITY)
Admission: EM | Admit: 2023-12-29 | Discharge: 2024-01-01 | DRG: 637 | Disposition: A | Discharge status: Home / Self Care | Attending: Internal Medicine | Admitting: Internal Medicine

## 2023-12-29 ENCOUNTER — Other Ambulatory Visit: Payer: Self-pay

## 2023-12-29 ENCOUNTER — Encounter (HOSPITAL_COMMUNITY): Payer: Self-pay

## 2023-12-29 DIAGNOSIS — E1110 Type 2 diabetes mellitus with ketoacidosis without coma: Principal | ICD-10-CM | POA: Diagnosis present

## 2023-12-29 DIAGNOSIS — G9341 Metabolic encephalopathy: Secondary | ICD-10-CM | POA: Diagnosis present

## 2023-12-29 DIAGNOSIS — Z85841 Personal history of malignant neoplasm of brain: Secondary | ICD-10-CM

## 2023-12-29 DIAGNOSIS — G4700 Insomnia, unspecified: Secondary | ICD-10-CM | POA: Diagnosis present

## 2023-12-29 DIAGNOSIS — Z59819 Housing instability, housed unspecified: Secondary | ICD-10-CM

## 2023-12-29 DIAGNOSIS — Z7984 Long term (current) use of oral hypoglycemic drugs: Secondary | ICD-10-CM

## 2023-12-29 DIAGNOSIS — Z5986 Financial insecurity: Secondary | ICD-10-CM

## 2023-12-29 DIAGNOSIS — I2510 Atherosclerotic heart disease of native coronary artery without angina pectoris: Secondary | ICD-10-CM

## 2023-12-29 DIAGNOSIS — I5022 Chronic systolic (congestive) heart failure: Secondary | ICD-10-CM | POA: Diagnosis present

## 2023-12-29 DIAGNOSIS — F03918 Unspecified dementia, unspecified severity, with other behavioral disturbance: Secondary | ICD-10-CM | POA: Diagnosis present

## 2023-12-29 DIAGNOSIS — Z833 Family history of diabetes mellitus: Secondary | ICD-10-CM

## 2023-12-29 DIAGNOSIS — I509 Heart failure, unspecified: Secondary | ICD-10-CM

## 2023-12-29 DIAGNOSIS — Z85118 Personal history of other malignant neoplasm of bronchus and lung: Secondary | ICD-10-CM

## 2023-12-29 DIAGNOSIS — E785 Hyperlipidemia, unspecified: Secondary | ICD-10-CM | POA: Diagnosis present

## 2023-12-29 DIAGNOSIS — Z880 Allergy status to penicillin: Secondary | ICD-10-CM

## 2023-12-29 DIAGNOSIS — I5032 Chronic diastolic (congestive) heart failure: Secondary | ICD-10-CM

## 2023-12-29 DIAGNOSIS — I110 Hypertensive heart disease with heart failure: Secondary | ICD-10-CM | POA: Diagnosis present

## 2023-12-29 DIAGNOSIS — Z8639 Personal history of other endocrine, nutritional and metabolic disease: Secondary | ICD-10-CM | POA: Diagnosis present

## 2023-12-29 DIAGNOSIS — G40909 Epilepsy, unspecified, not intractable, without status epilepticus: Secondary | ICD-10-CM | POA: Diagnosis present

## 2023-12-29 DIAGNOSIS — Z7982 Long term (current) use of aspirin: Secondary | ICD-10-CM

## 2023-12-29 DIAGNOSIS — Z8249 Family history of ischemic heart disease and other diseases of the circulatory system: Secondary | ICD-10-CM

## 2023-12-29 DIAGNOSIS — Z794 Long term (current) use of insulin: Secondary | ICD-10-CM

## 2023-12-29 DIAGNOSIS — Z87891 Personal history of nicotine dependence: Secondary | ICD-10-CM

## 2023-12-29 DIAGNOSIS — R4189 Other symptoms and signs involving cognitive functions and awareness: Secondary | ICD-10-CM

## 2023-12-29 DIAGNOSIS — Z888 Allergy status to other drugs, medicaments and biological substances status: Secondary | ICD-10-CM

## 2023-12-29 DIAGNOSIS — Z79899 Other long term (current) drug therapy: Secondary | ICD-10-CM

## 2023-12-29 DIAGNOSIS — E119 Type 2 diabetes mellitus without complications: Secondary | ICD-10-CM

## 2023-12-29 DIAGNOSIS — Z8673 Personal history of transient ischemic attack (TIA), and cerebral infarction without residual deficits: Secondary | ICD-10-CM

## 2023-12-29 DIAGNOSIS — B1920 Unspecified viral hepatitis C without hepatic coma: Secondary | ICD-10-CM | POA: Diagnosis present

## 2023-12-29 DIAGNOSIS — C7931 Secondary malignant neoplasm of brain: Secondary | ICD-10-CM | POA: Diagnosis present

## 2023-12-29 HISTORY — DX: Chronic diastolic (congestive) heart failure: I50.32

## 2023-12-29 HISTORY — DX: Atherosclerotic heart disease of native coronary artery without angina pectoris: I25.10

## 2023-12-29 LAB — CBG MONITORING, ED
Glucose-Capillary: 386 mg/dL — ABNORMAL HIGH (ref 70–99)
Glucose-Capillary: 303 mg/dL — ABNORMAL HIGH (ref 70–99)
Glucose-Capillary: 205 mg/dL — ABNORMAL HIGH (ref 70–99)
Glucose-Capillary: 136 mg/dL — ABNORMAL HIGH (ref 70–99)

## 2023-12-29 LAB — CBC WITH DIFFERENTIAL/PLATELET
Abs Immature Granulocytes: 0.04 K/uL (ref 0.00–0.07)
Basophils Absolute: 0 K/uL (ref 0.0–0.1)
Basophils Relative: 1 %
Eosinophils Absolute: 0.1 K/uL (ref 0.0–0.5)
Eosinophils Relative: 1 %
HCT: 38.9 % — ABNORMAL LOW (ref 39.0–52.0)
Hemoglobin: 12.8 g/dL — ABNORMAL LOW (ref 13.0–17.0)
Immature Granulocytes: 1 %
Lymphocytes Relative: 19 %
Lymphs Abs: 1.6 K/uL (ref 0.7–4.0)
MCH: 28.6 pg (ref 26.0–34.0)
MCHC: 32.9 g/dL (ref 30.0–36.0)
MCV: 87 fL (ref 80.0–100.0)
Monocytes Absolute: 0.5 K/uL (ref 0.1–1.0)
Monocytes Relative: 6 %
Neutro Abs: 5.9 K/uL (ref 1.7–7.7)
Neutrophils Relative %: 72 %
Platelets: 435 K/uL — ABNORMAL HIGH (ref 150–400)
RBC: 4.47 MIL/uL (ref 4.22–5.81)
RDW: 13.3 % (ref 11.5–15.5)
WBC: 8.1 K/uL (ref 4.0–10.5)
nRBC: 0 % (ref 0.0–0.2)

## 2023-12-29 LAB — BLOOD GAS, VENOUS
Acid-base deficit: 5.6 mmol/L — ABNORMAL HIGH (ref 0.0–2.0)
Bicarbonate: 20.1 mmol/L (ref 20.0–28.0)
O2 Saturation: 25 %
Patient temperature: 37
pCO2, Ven: 39 mmHg — ABNORMAL LOW (ref 44–60)
pH, Ven: 7.32 (ref 7.25–7.43)
pO2, Ven: <31 mmHg — CL (ref 32–45)

## 2023-12-29 LAB — COMPREHENSIVE METABOLIC PANEL WITH GFR
ALT: 11 U/L (ref 0–44)
AST: 14 U/L — ABNORMAL LOW (ref 15–41)
Albumin: 4.1 g/dL (ref 3.5–5.0)
Alkaline Phosphatase: 65 U/L (ref 38–126)
Anion gap: 20 — ABNORMAL HIGH (ref 5–15)
BUN: 19 mg/dL (ref 8–23)
CO2: 17 mmol/L — ABNORMAL LOW (ref 22–32)
Calcium: 9.7 mg/dL (ref 8.9–10.3)
Chloride: 95 mmol/L — ABNORMAL LOW (ref 98–111)
Creatinine, Ser: 1.01 mg/dL (ref 0.61–1.24)
GFR, Estimated: >60 mL/min (ref >60)
Glucose, Bld: 379 mg/dL — ABNORMAL HIGH (ref 70–99)
Potassium: 4.3 mmol/L (ref 3.5–5.1)
Sodium: 132 mmol/L — ABNORMAL LOW (ref 135–145)
Total Bilirubin: 1 mg/dL (ref 0.0–1.2)
Total Protein: 8 g/dL (ref 6.5–8.1)

## 2023-12-29 LAB — I-STAT CHEM 8, ED
BUN: 21 mg/dL (ref 8–23)
Calcium, Ion: 1.22 mmol/L (ref 1.15–1.40)
Chloride: 98 mmol/L (ref 98–111)
Creatinine, Ser: 0.7 mg/dL (ref 0.61–1.24)
Glucose, Bld: 385 mg/dL — ABNORMAL HIGH (ref 70–99)
HCT: 40 % (ref 39.0–52.0)
Hemoglobin: 13.6 g/dL (ref 13.0–17.0)
Potassium: 5.2 mmol/L — ABNORMAL HIGH (ref 3.5–5.1)
Sodium: 132 mmol/L — ABNORMAL LOW (ref 135–145)
TCO2: 21 mmol/L — ABNORMAL LOW (ref 22–32)

## 2023-12-29 LAB — URINALYSIS, ROUTINE W REFLEX MICROSCOPIC
Bacteria, UA: NONE SEEN
Bilirubin Urine: NEGATIVE
Glucose, UA: >=500 mg/dL — AB
Hgb urine dipstick: NEGATIVE
Ketones, ur: 20 mg/dL — AB
Leukocytes,Ua: NEGATIVE
Nitrite: NEGATIVE
Protein, ur: NEGATIVE mg/dL
Specific Gravity, Urine: 1.024 (ref 1.005–1.030)
pH: 5 (ref 5.0–8.0)

## 2023-12-29 LAB — BETA-HYDROXYBUTYRIC ACID: Beta-Hydroxybutyric Acid: 6.38 mmol/L — ABNORMAL HIGH (ref 0.05–0.27)

## 2023-12-29 LAB — GLUCOSE, CAPILLARY: Glucose-Capillary: 110 mg/dL — ABNORMAL HIGH (ref 70–99)

## 2023-12-29 MED ORDER — ISOSORBIDE MONONITRATE ER 60 MG PO TB24
60.0000 mg | ORAL_TABLET | Freq: Every day | ORAL | Status: DC
  Administered 2023-12-30 – 2024-01-01 (×3): 60 mg via ORAL
  Filled 2023-12-29 (×3): qty 1

## 2023-12-29 MED ORDER — DEXTROSE IN LACTATED RINGERS 5 % IV SOLN: INTRAVENOUS | Status: DC

## 2023-12-29 MED ORDER — LACTATED RINGERS IV BOLUS
1000.0000 mL | Freq: Once | INTRAVENOUS | Status: AC
  Administered 2023-12-29: 1000 mL via INTRAVENOUS

## 2023-12-29 MED ORDER — INSULIN REGULAR(HUMAN) IN NACL 100-0.9 UT/100ML-% IV SOLN
INTRAVENOUS | Status: DC
  Filled 2023-12-29: qty 100

## 2023-12-29 MED ORDER — INSULIN REGULAR(HUMAN) IN NACL 100-0.9 UT/100ML-% IV SOLN
INTRAVENOUS | Status: DC
  Administered 2023-12-29: 1.1 [IU]/h via INTRAVENOUS
  Administered 2023-12-29: 16 [IU]/h via INTRAVENOUS

## 2023-12-29 MED ORDER — ACETAMINOPHEN 650 MG RE SUPP: 650.0000 mg | Freq: Four times a day (QID) | RECTAL | Status: DC | PRN

## 2023-12-29 MED ORDER — LACTATED RINGERS IV SOLN: INTRAVENOUS | Status: DC

## 2023-12-29 MED ORDER — ASPIRIN 81 MG PO TBEC
81.0000 mg | DELAYED_RELEASE_TABLET | Freq: Every day | ORAL | Status: DC
  Administered 2023-12-30 – 2024-01-01 (×3): 81 mg via ORAL
  Filled 2023-12-29 (×3): qty 1

## 2023-12-29 MED ORDER — METOPROLOL SUCCINATE ER 25 MG PO TB24
12.5000 mg | ORAL_TABLET | Freq: Every day | ORAL | Status: DC
Start: 2023-12-29 — End: 2024-01-01
  Administered 2023-12-30 – 2023-12-31 (×3): 12.5 mg via ORAL
  Filled 2023-12-29 (×3): qty 1

## 2023-12-29 MED ORDER — DEXTROSE 50 % IV SOLN: 0.0000 mL | INTRAVENOUS | Status: DC | PRN

## 2023-12-29 MED ORDER — ACETAMINOPHEN 325 MG PO TABS: 650.0000 mg | ORAL_TABLET | Freq: Four times a day (QID) | ORAL | Status: DC | PRN

## 2023-12-29 MED ORDER — LEVETIRACETAM 500 MG PO TABS
750.0000 mg | ORAL_TABLET | Freq: Two times a day (BID) | ORAL | Status: DC
  Administered 2023-12-30 – 2024-01-01 (×6): 750 mg via ORAL
  Filled 2023-12-29 (×7): qty 1

## 2023-12-29 MED ORDER — ENOXAPARIN SODIUM 40 MG/0.4ML IJ SOSY
40.0000 mg | PREFILLED_SYRINGE | INTRAMUSCULAR | Status: DC
  Administered 2023-12-30 – 2024-01-01 (×3): 40 mg via SUBCUTANEOUS
  Filled 2023-12-29 (×3): qty 0.4

## 2023-12-29 MED ORDER — CHLORHEXIDINE GLUCONATE CLOTH 2 % EX PADS
6.0000 | MEDICATED_PAD | Freq: Every day | CUTANEOUS | Status: DC
  Administered 2023-12-30 – 2024-01-01 (×2): 6 via TOPICAL

## 2023-12-29 MED ORDER — LACTATED RINGERS IV SOLN: INTRAVENOUS | Status: AC

## 2023-12-29 NOTE — ED Notes (Signed)
Admit provider at bedside 

## 2023-12-29 NOTE — ED Triage Notes (Signed)
 Family reports patients CBG has been high and he has been more confused, lethargic jittery and not acting right.  Patient reports a lot of urinating and being thirsty.  Also reports loss of appetite x 1 month

## 2023-12-29 NOTE — H&P (Addendum)
 History and Physical    Jared Shea. FMW:[id number] DOB: Apr 11, 1956 DOA: 12/29/2023  PCP: Elicia Sharper, DO  Jared Shea coming from: Home  Chief Complaint: High blood sugar  HPI: Jared Shea. is a 68 y.o. male with medical history significant of type 2 diabetes diabetes, small cell lung cancer with brain mets status post brain tumor resection/chemo/radiation/immunotherapy and currently on observation, HFmrEF, CAD, hypertension, hyperlipidemia, hep C, seizure disorder, headaches, insomnia presented to the ED yesterday with abdominal pain, nausea, vomiting and was admitted for management for DKA.  Jared Shea eloped/left AMA soon after admission.  Jared Shea presents to the ED this evening for evaluation of high glucose and confusion.  Family told ED physician that Jared Shea drinks a lot of soda.  Vital signs stable.  Labs showing no leukocytosis, hemoglobin 12.8 (previously 11.7-12.9 on labs several months ago), MCV 87.0, platelet count 435k (elevated on previous labs as well), sodium 132, potassium 4.3, chloride 95, glucose 379, bicarb 17, anion gap 20, beta hydroxybutyric acid 6.38, VBG with pH 7.32, UA pending.  Jared Shea was given 1 L LR bolus and started on insulin  drip and IV fluids per DKA protocol.  [facility] called admit.  Jared Shea is reporting generalized weakness, polyuria, and polydipsia.  He is currently only on metformin  for his diabetes.  Per son at bedside, Jared Shea does not watch his diet and drinks a lot of sodas.  Son states Jared Shea's stool is sometimes hard and sometimes soft.  He is also concerned that the Jared Shea has been confused for the past few days.  Jared Shea denies nausea, vomiting, or abdominal pain.  Denies fevers, cough, shortness of breath, or chest pain.  Review of Systems:  Review of Systems  All other systems reviewed and are negative.   Past Medical History:  Diagnosis Date   Brain mass    brain cancer   Bursitis of right hip    Chest pain 08/12/2018   Chronic cough     Dependence on nicotine  from cigarettes    Diabetes mellitus without complication (HCC)    Dysuria 04/03/2021   Essential hypertension 07/28/2018   Frequent headaches 06/27/2020   Metastatic lung cancer (metastasis from lung to other site) [facility])    nscl ca dx'd 12/2019   Polyarthralgia 06/15/2020   Seizures (HCC)    Viral illness 03/23/2019    Past Surgical History:  Procedure Laterality Date   APPLICATION OF CRANIAL NAVIGATION N/A 01/07/2020   Procedure: APPLICATION OF CRANIAL NAVIGATION;  Surgeon: Gillie Duncans, MD;  Location: [facility] OR;  Service: Neurosurgery;  Laterality: N/A;   CRANIOTOMY Right 01/07/2020   Procedure: RIGHT CRANIOTOMY FOR TUMOR RESECTION;  Surgeon: Gillie Duncans, MD;  Location: [facility] OR;  Service: Neurosurgery;  Laterality: Right;  rm 21   ENDOBRONCHIAL ULTRASOUND N/A 12/21/2019   Procedure: ENDOBRONCHIAL ULTRASOUND;  Surgeon: Neda Jennet LABOR, MD;  Location: [facility] ENDOSCOPY;  Service: Pulmonary;  Laterality: N/A;   FINE NEEDLE ASPIRATION  12/21/2019   Procedure: FINE NEEDLE ASPIRATION (FNA) LINEAR;  Surgeon: Neda Jennet LABOR, MD;  Location: [facility] ENDOSCOPY;  Service: Pulmonary;;   IR IMAGING GUIDED PORT INSERTION  03/03/2020   VIDEO BRONCHOSCOPY N/A 12/21/2019   Procedure: VIDEO BRONCHOSCOPY WITHOUT FLUORO;  Surgeon: Neda Jennet LABOR, MD;  Location: [facility] ENDOSCOPY;  Service: Pulmonary;  Laterality: N/A;     reports that he quit smoking about 4 years ago. His smoking use included cigarettes. He started smoking about 53 years ago. He has a 49.4 pack-year smoking history. He has never used smokeless tobacco. He reports  that he does not currently use alcohol. He reports that he does not use drugs.  Allergies  Allergen Reactions   Jardiance  [Empagliflozin ]     Caused diarrhea & itching, so we stopped   Penicillins Rash    Did it involve swelling of the face/tongue/throat, SOB, or low BP? N Did it involve sudden or severe rash/hives, skin peeling, or any reaction on the  inside of your mouth or nose? Y Did you need to seek medical attention at a hospital or doctor's office? Y When did it last happen?    childhood   If all above answers are "NO", may proceed with cephalosporin use.     Family History  Problem Relation Age of Onset   Heart disease Mother        CABG   Hypertension Mother    Kidney disease Sister    Hypertension Sister    Diabetes Brother    Heart attack Brother    Kidney disease Brother        KIDNEY TRANSPLANT   Healthy Daughter    Healthy Daughter    Healthy Son     Prior to Admission medications   Medication Sig Start Date End Date Taking? Authorizing Provider  aspirin  EC 81 MG tablet Take 1 tablet (81 mg total) by mouth daily. Swallow whole. 01/08/23   Tobie Gaines, DO  atorvastatin  (LIPITOR) 20 MG tablet Take 1 tablet (20 mg total) by mouth daily. 09/05/23 09/04/24  Zheng, Michael, DO  isosorbide  mononitrate (IMDUR ) 60 MG 24 hr tablet Take 60 mg by mouth daily. 09/10/23   [provider]  levETIRAcetam  (KEPPRA ) 750 MG tablet TAKE 1 TABLET(750 MG) BY MOUTH TWICE DAILY 05/06/23   Vaslow, Zachary K, MD  losartan -hydrochlorothiazide  (HYZAAR) 50-12.5 MG tablet Take 1 tablet by mouth daily. 09/05/23 09/04/24  Zheng, Michael, DO  melatonin 5 MG TABS Take 1 tablet (5 mg total) by mouth at bedtime as needed (for sleep). 08/20/22   Jinwala, Sagar H, MD  metFORMIN  (GLUCOPHAGE ) 500 MG tablet TAKE 1 TABLET(500 MG) BY MOUTH TWICE DAILY WITH A MEAL 08/12/23   Zheng, Michael, DO  metoprolol  succinate (TOPROL  XL) 25 MG 24 hr tablet Take 0.5 tablets (12.5 mg total) by mouth at bedtime. 09/20/23   Alexander-Savino, [place], MD  nortriptyline  (PAMELOR ) 25 MG capsule Take 25 mg by mouth 2 (two) times daily. 05/27/22   [provider]    Physical Exam: Vitals:   12/29/23 1800 12/29/23 2145  BP: 118/72 131/78  Pulse: 92 84  Resp: 18 (!) 22  Temp: 97.9 F (36.6 C)   TempSrc: Oral   SpO2: 99% 95%  Weight: 73.9 kg   Height: 5' 10  (1.778 m)     Physical Exam Vitals reviewed.  Constitutional:      General: He is not in acute distress. HENT:     Head: Normocephalic and atraumatic.  Eyes:     Extraocular Movements: Extraocular movements intact.  Cardiovascular:     Rate and Rhythm: Normal rate and regular rhythm.     Heart sounds: Normal heart sounds.  Pulmonary:     Effort: Pulmonary effort is normal. No respiratory distress.     Breath sounds: Normal breath sounds. No wheezing, rhonchi or rales.  Abdominal:     General: Bowel sounds are normal. There is no distension.     Palpations: Abdomen is soft.     Tenderness: There is no abdominal tenderness. There is no guarding.  Musculoskeletal:     Cervical  back: Normal range of motion.     Right lower leg: No edema.     Left lower leg: No edema.  Skin:    General: Skin is warm and dry.  Neurological:     General: No focal deficit present.     Mental Status: He is alert and oriented to person, place, and time.     Cranial Nerves: No cranial nerve deficit.     Sensory: No sensory deficit.     Motor: No weakness.     Labs on Admission: I have personally reviewed following labs and imaging studies  CBC: Recent Labs  Lab 12/28/23 0959 12/28/23 1208 12/28/23 1938 12/29/23 1834 12/29/23 1838  WBC 8.7  --  10.5  --  8.1  NEUTROABS  --   --  7.4  --  5.9  HGB 14.4 15.0 14.0 13.6 12.8*  HCT 41.9 44.0 39.8 40.0 38.9*  MCV 85.7  --  83.8  --  87.0  PLT 447*  --  481*  --  435*   Basic Metabolic Panel: Recent Labs  Lab 12/28/23 0959 12/28/23 1208 12/28/23 1312 12/28/23 1938 12/29/23 1834 12/29/23 1838  NA 126* 129* 130* 136 132* 132*  K 4.5 4.9 5.4* 3.8 5.2* 4.3  CL 93*  --  95* 100 98 95*  CO2 16*  --  17* 18*  --  17*  GLUCOSE 668*  --  573* 183* 385* 379*  BUN 31*  --  28* 21 21 19   CREATININE 1.08  --  1.11 0.87 0.70 1.01  CALCIUM  10.0  --  10.4* 10.2  --  9.7   GFR: Estimated Creatinine Clearance: 71.3 mL/min (by C-G formula based on  SCr of 1.01 mg/dL). Liver Function Tests: Recent Labs  Lab 12/28/23 0959 12/28/23 1938 12/29/23 1838  AST 15 16 14*  ALT 13 12 11   ALKPHOS 78 74 65  BILITOT 1.1 0.9 1.0  PROT 8.3* 8.1 8.0  ALBUMIN 4.4 4.3 4.1   Recent Labs  Lab 12/28/23 0959 12/28/23 1938  LIPASE 24 23   No results for input(s): AMMONIA in the last 168 hours. Coagulation Profile: No results for input(s): INR, PROTIME in the last 168 hours. Cardiac Enzymes: No results for input(s): CKTOTAL, CKMB, CKMBINDEX, TROPONINI in the last 168 hours. BNP (last 3 results) No results for input(s): PROBNP in the last 8760 hours. HbA1C: Recent Labs    12/28/23 1938  HGBA1C 13.1*   CBG: Recent Labs  Lab 12/28/23 1546 12/28/23 1926 12/29/23 1803 12/29/23 2029 12/29/23 2128  GLUCAP 323* 188* 386* 303* 205*   Lipid Profile: No results for input(s): CHOL, HDL, LDLCALC, TRIG, CHOLHDL, LDLDIRECT in the last 72 hours. Thyroid  Function Tests: No results for input(s): TSH, T4TOTAL, FREET4, T3FREE, THYROIDAB in the last 72 hours. Anemia Panel: No results for input(s): VITAMINB12, FOLATE, FERRITIN, TIBC, IRON, RETICCTPCT in the last 72 hours. Urine analysis:    Component Value Date/Time   COLORURINE YELLOW 12/28/2023 1935   APPEARANCEUR CLEAR 12/28/2023 1935   APPEARANCEUR Clear 07/30/2019 1330   LABSPEC 1.042 (H) 12/28/2023 1935   PHURINE 5.0 12/28/2023 1935   GLUCOSEU >=500 (A) 12/28/2023 1935   HGBUR NEGATIVE 12/28/2023 1935   BILIRUBINUR NEGATIVE 12/28/2023 1935   BILIRUBINUR Negative 04/03/2021 1503   BILIRUBINUR Negative 07/30/2019 1330   KETONESUR 20 (A) 12/28/2023 1935   PROTEINUR NEGATIVE 12/28/2023 1935   UROBILINOGEN 1.0 04/03/2021 1503   NITRITE NEGATIVE 12/28/2023 1935   LEUKOCYTESUR NEGATIVE 12/28/2023 1935  Radiological Exams on Admission: CT Head Wo Contrast Result Date: 12/28/2023 CLINICAL DATA:  Mental status change EXAM: CT HEAD WITHOUT  CONTRAST TECHNIQUE: Contiguous axial images were obtained from the base of the skull through the vertex without intravenous contrast. RADIATION DOSE REDUCTION: This exam was performed according to the departmental dose-optimization program which includes automated exposure control, adjustment of the mA and/or kV according to Jared Shea size and/or use of iterative reconstruction technique. COMPARISON:  CT brain 12/28/2023, MRI 05/16/2023 FINDINGS: Brain: No acute territorial infarction, hemorrhage or intracranial mass. Stable ventricle size. Mild chronic small vessel ischemic changes of the white matter. Stable small focus of encephalomalacia at the right frontal lobe. Stable small focus of encephalomalacia within the left posterior white matter abutting the ventricle. The ventricles are nonenlarged Vascular: No hyperdense vessels.  Carotid vascular calcification Skull: Right anterior craniotomy.  No fracture Sinuses/Orbits: No acute finding. Other: None IMPRESSION: 1. No CT evidence for acute intracranial abnormality. 2. Mild chronic small vessel ischemic changes of the white matter. Right anterior craniotomy Electronically Signed   By: Luke Bun M.D.   On: 12/28/2023 21:00   CT ABDOMEN PELVIS W CONTRAST Result Date: 12/28/2023 CLINICAL DATA:  Chest and abdominal pain. History of non-small cell cancer. EXAM: CT ANGIOGRAPHY CHEST CT ABDOMEN AND PELVIS WITH CONTRAST TECHNIQUE: Multidetector CT imaging of the chest was performed using the standard protocol during bolus administration of intravenous contrast. Multiplanar CT image reconstructions and MIPs were obtained to evaluate the vascular anatomy. Multidetector CT imaging of the abdomen and pelvis was performed using the standard protocol during bolus administration of intravenous contrast. RADIATION DOSE REDUCTION: This exam was performed according to the departmental dose-optimization program which includes automated exposure control, adjustment of the mA  and/or kV according to Jared Shea size and/or use of iterative reconstruction technique. CONTRAST:  75mL OMNIPAQUE  IOHEXOL  350 MG/ML SOLN COMPARISON:  CT scan 09/10/2023 FINDINGS: CTA CHEST FINDINGS Cardiovascular: The heart is normal in size. No pericardial effusion. The aorta is normal in caliber. No dissection or atherosclerotic calcification. Scattered three-vessel coronary artery calcifications are noted. The pulmonary arterial tree is well opacified. No filling defects to suggest pulmonary embolism. Mediastinum/Nodes: Scattered mediastinal and hilar lymph nodes are stable. No mass or overt adenopathy. No new or progressive changes. The esophagus is grossly normal. Lungs/Pleura: Stable underlying emphysematous changes and areas of pulmonary scarring. No infiltrates, edema or effusions. No worrisome pulmonary lesions or pulmonary nodules. Musculoskeletal: No supraclavicular or axillary adenopathy. The right IJ Port-A-Cath is stable. No worrisome lytic sclerotic bone lesions. Review of the MIP images confirms the above findings. CT ABDOMEN and PELVIS FINDINGS Hepatobiliary: Stable fatty change near the falciform ligament. No worrisome hepatic lesions or intrahepatic biliary dilatation. The gallbladder is normal. No common bile duct dilatation. Pancreas: Progressive diffuse pancreatic atrophy but no mass, inflammation or ductal dilatation. Spleen: Normal in size without focal abnormality. Adrenals/Urinary Tract: The adrenal glands are normal. No renal lesions or hydronephrosis. The bladder is unremarkable. Stomach/Bowel: The stomach, duodenum, small bowel and colon are grossly normal without oral contrast. No inflammatory changes, mass lesions or obstructive findings. The appendix is normal. Stable colonic diverticulosis. Vascular/Lymphatic: The aorta is normal in caliber. No dissection. Stable age advanced atherosclerotic calcifications. The branch vessels are patent. The major venous structures are patent. No  mesenteric or retroperitoneal mass or adenopathy. Small scattered lymph nodes are noted. Reproductive: Prostate is unremarkable. Other: No pelvic mass or adenopathy. No free pelvic fluid collections. No inguinal mass or adenopathy. No abdominal wall hernia or  subcutaneous lesions. Musculoskeletal: No significant bony findings. Review of the MIP images confirms the above findings. IMPRESSION: 1. No CT findings for pulmonary embolism. 2. Normal caliber thoracic aorta.  No dissection. 3. Stable emphysematous changes and areas of pulmonary scarring. 4. No acute abdominal/pelvic findings, mass lesions or adenopathy. 5. Progressive diffuse pancreatic atrophy but no mass, inflammation or ductal dilatation. 6. Stable age advanced atherosclerotic calcifications involving the aorta and branch vessels including the coronary arteries. Aortic Atherosclerosis (ICD10-I70.0) and Emphysema (ICD10-J43.9). Electronically Signed   By: MYRTIS Stammer M.D.   On: 12/28/2023 14:33   CT Angio Chest PE W and/or Wo Contrast Result Date: 12/28/2023 CLINICAL DATA:  Chest and abdominal pain. History of non-small cell cancer. EXAM: CT ANGIOGRAPHY CHEST CT ABDOMEN AND PELVIS WITH CONTRAST TECHNIQUE: Multidetector CT imaging of the chest was performed using the standard protocol during bolus administration of intravenous contrast. Multiplanar CT image reconstructions and MIPs were obtained to evaluate the vascular anatomy. Multidetector CT imaging of the abdomen and pelvis was performed using the standard protocol during bolus administration of intravenous contrast. RADIATION DOSE REDUCTION: This exam was performed according to the departmental dose-optimization program which includes automated exposure control, adjustment of the mA and/or kV according to Jared Shea size and/or use of iterative reconstruction technique. CONTRAST:  75mL OMNIPAQUE  IOHEXOL  350 MG/ML SOLN COMPARISON:  CT scan 09/10/2023 FINDINGS: CTA CHEST FINDINGS Cardiovascular: The  heart is normal in size. No pericardial effusion. The aorta is normal in caliber. No dissection or atherosclerotic calcification. Scattered three-vessel coronary artery calcifications are noted. The pulmonary arterial tree is well opacified. No filling defects to suggest pulmonary embolism. Mediastinum/Nodes: Scattered mediastinal and hilar lymph nodes are stable. No mass or overt adenopathy. No new or progressive changes. The esophagus is grossly normal. Lungs/Pleura: Stable underlying emphysematous changes and areas of pulmonary scarring. No infiltrates, edema or effusions. No worrisome pulmonary lesions or pulmonary nodules. Musculoskeletal: No supraclavicular or axillary adenopathy. The right IJ Port-A-Cath is stable. No worrisome lytic sclerotic bone lesions. Review of the MIP images confirms the above findings. CT ABDOMEN and PELVIS FINDINGS Hepatobiliary: Stable fatty change near the falciform ligament. No worrisome hepatic lesions or intrahepatic biliary dilatation. The gallbladder is normal. No common bile duct dilatation. Pancreas: Progressive diffuse pancreatic atrophy but no mass, inflammation or ductal dilatation. Spleen: Normal in size without focal abnormality. Adrenals/Urinary Tract: The adrenal glands are normal. No renal lesions or hydronephrosis. The bladder is unremarkable. Stomach/Bowel: The stomach, duodenum, small bowel and colon are grossly normal without oral contrast. No inflammatory changes, mass lesions or obstructive findings. The appendix is normal. Stable colonic diverticulosis. Vascular/Lymphatic: The aorta is normal in caliber. No dissection. Stable age advanced atherosclerotic calcifications. The branch vessels are patent. The major venous structures are patent. No mesenteric or retroperitoneal mass or adenopathy. Small scattered lymph nodes are noted. Reproductive: Prostate is unremarkable. Other: No pelvic mass or adenopathy. No free pelvic fluid collections. No inguinal mass or  adenopathy. No abdominal wall hernia or subcutaneous lesions. Musculoskeletal: No significant bony findings. Review of the MIP images confirms the above findings. IMPRESSION: 1. No CT findings for pulmonary embolism. 2. Normal caliber thoracic aorta.  No dissection. 3. Stable emphysematous changes and areas of pulmonary scarring. 4. No acute abdominal/pelvic findings, mass lesions or adenopathy. 5. Progressive diffuse pancreatic atrophy but no mass, inflammation or ductal dilatation. 6. Stable age advanced atherosclerotic calcifications involving the aorta and branch vessels including the coronary arteries. Aortic Atherosclerosis (ICD10-I70.0) and Emphysema (ICD10-J43.9). Electronically Signed   By: SHAUNNA.  Gallerani M.D.   On: 12/28/2023 14:33   CT Head Wo Contrast Result Date: 12/28/2023 CLINICAL DATA:  Headache, increasing frequency or severity hx cancer EXAM: CT HEAD WITHOUT CONTRAST TECHNIQUE: Contiguous axial images were obtained from the base of the skull through the vertex without intravenous contrast. RADIATION DOSE REDUCTION: This exam was performed according to the departmental dose-optimization program which includes automated exposure control, adjustment of the mA and/or kV according to Jared Shea size and/or use of iterative reconstruction technique. COMPARISON:  MRI head 03/20/2019 FINDINGS: Brain: Patchy and confluent areas of decreased attenuation are noted throughout the deep and periventricular white matter of the cerebral hemispheres bilaterally, compatible with chronic microvascular ischemic disease. No evidence of large-territorial acute infarction. No parenchymal hemorrhage. No mass lesion. No extra-axial collection. No mass effect or midline shift. No hydrocephalus. Basilar cisterns are patent. Vascular: No hyperdense vessel. Atherosclerotic calcifications are present within the cavernous internal carotid arteries. Skull: No acute fracture or focal lesion.  Prior right burr holes. Sinuses/Orbits:  Paranasal sinuses and mastoid air cells are clear. The orbits are unremarkable. Other: None. IMPRESSION: No acute intracranial abnormality. Electronically Signed   By: Morgane  Naveau M.D.   On: 12/28/2023 14:20    Assessment and Plan  DKA in the setting of poorly controlled type 2 diabetes Suspect noncompliance as son reports Jared Shea drinking a lot of soda.  A1c was 6.1 in April 2025 and Jared Shea is on metformin .  A1c now much worse at 13.1 on labs yesterday.  He was admitted for DKA by internal medicine teaching service yesterday and eloped/left AMA soon after admission.  Labs today: Glucose 379, bicarb 17, anion gap 20, beta hydroxybutyric acid 6.38, VBG with pH 7.32.  Keep n.p.o. and continue insulin  drip and IV fluids per DKA protocol.  Frequent CBG checks per Endo tool.  Monitor BMP and beta hydroxybutyric acid every 4 hours.  Order diet and transition to subcutaneous insulin  once DKA resolves.  Acute metabolic encephalopathy Likely secondary to DKA.  Jared Shea had CT head done twice yesterday and both times it was negative for acute abnormality.  Jared Shea is currently AAO x 4 and answering questions appropriately.  He has no focal neurodeficit on exam.  Both the Jared Shea himself and his son also confirms that Jared Shea has not had any falls or head injury since he left the hospital yesterday.  Small cell lung cancer with brain mets Status post brain tumor resection/chemo/radiation/immunotherapy and currently on observation.  Outpatient oncology follow-up.  Chronic HFmrEF: Last echo done in June 2024 showing EF 50 to 55%, grade 1 diastolic dysfunction, trivial mitral regurgitation, and trivial aortic regurgitation.  No signs of volume overload at this time. CAD: Jared Shea is not endorsing any anginal symptoms. Hypertension: Currently normotensive. 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  Continue home medications after this is  done.  DVT prophylaxis: Lovenox  Code Status: Full Code (discussed with the Jared Shea) Family Communication: Son at bedside. Level of care: Step Down Unit Admission status: It is my clinical opinion that referral for OBSERVATION is reasonable and necessary in this Jared Shea based on the above information provided. The aforementioned taken together are felt to place the Jared Shea at high risk for further clinical deterioration. However, it is anticipated that the Jared Shea may be medically stable for discharge from the hospital within 24 to 48 hours.  Editha Ram MD [facility]  If 7PM-7AM, please contact night-coverage [email or web address]  12/29/2023, 9:57 PM

## 2023-12-29 NOTE — ED Provider Notes (Signed)
 [place] EMERGENCY DEPARTMENT AT [facility] Provider Note   CSN: [id number] Arrival date & time: 12/29/23  1754     Patient presents with: Hyperglycemia and Altered Mental Status   Jared Shea. is a 68 y.o. male.   68 year old male presenting with hyperglycemia.  Patient was seen at [facility] yesterday and found to be hyperglycemic with concern for DKA, patient was admitted to the internal medicine teaching service and started on Endo tool, patient tells me that he woke up in the hospital room while admitted and was confused, I did not know where he was and did not recommend anything, therefore he removed his IV and left AGAINST MEDICAL ADVICE.  Patient then presented again to the emergency department and was seen in triage, however he then tells me that he did not want to wait again to be seen and therefore left.  He is here today with his son who provides collateral information, he tells me that his father has been more confused lately, says that they will be carrying on a conversation he will be sidetracked very easily, this is abnormal for him.  The patient's son also believes that his father was discharged from the hospital yesterday rather than leaving AGAINST MEDICAL ADVICE, he tells me that his aunt saw his father driving and when they stopped to speak with him he told her he did not really know what he was doing and seemed confused, thus they took him back to the emergency department.  He tells me that he was previously on metformin  for his diabetes but has been off of this for about a month, he tells me that he was taken off of this medication after he was started on Epclusa .  He endorses oliguria, denies nausea/vomiting/abdominal pain but does tell me that he was having some abdominal pain yesterday, his son tells me that his bowel movements are all over the place, sometimes soft.  Denies headache, chest pain, shortness of breath.  When asked if he is drinking more  water than normal, his son tells me I do not know about water but he drinks a lot of soda, the patient denies this.  A&O x 3.  Poor historian.   Hyperglycemia Associated symptoms: altered mental status   Altered Mental Status      Prior to Admission medications   Medication Sig Start Date End Date Taking? Authorizing Provider  aspirin  EC 81 MG tablet Take 1 tablet (81 mg total) by mouth daily. Swallow whole. 01/08/23   Tobie Gaines, DO  atorvastatin  (LIPITOR) 20 MG tablet Take 1 tablet (20 mg total) by mouth daily. 09/05/23 09/04/24  Zheng, Michael, DO  isosorbide  mononitrate (IMDUR ) 60 MG 24 hr tablet Take 60 mg by mouth daily. 09/10/23   [provider]  levETIRAcetam  (KEPPRA ) 750 MG tablet TAKE 1 TABLET(750 MG) BY MOUTH TWICE DAILY 05/06/23   Vaslow, Zachary K, MD  losartan -hydrochlorothiazide  (HYZAAR) 50-12.5 MG tablet Take 1 tablet by mouth daily. 09/05/23 09/04/24  Zheng, Michael, DO  melatonin 5 MG TABS Take 1 tablet (5 mg total) by mouth at bedtime as needed (for sleep). 08/20/22   Jinwala, Sagar H, MD  metFORMIN  (GLUCOPHAGE ) 500 MG tablet TAKE 1 TABLET(500 MG) BY MOUTH TWICE DAILY WITH A MEAL 08/12/23   Zheng, Michael, DO  metoprolol  succinate (TOPROL  XL) 25 MG 24 hr tablet Take 0.5 tablets (12.5 mg total) by mouth at bedtime. 09/20/23   Alexander-Savino, [place], MD  nortriptyline  (PAMELOR ) 25 MG capsule Take 25  mg by mouth 2 (two) times daily. 05/27/22   [provider]    Allergies: Jardiance  [empagliflozin ] and Penicillins    Review of Systems  Updated Vital Signs  Vitals:   12/29/23 1800  BP: 118/72  Pulse: 92  Resp: 18  Temp: 97.9 F (36.6 C)  TempSrc: Oral  SpO2: 99%  Weight: 73.9 kg  Height: 5' 10 (1.778 m)     Physical Exam Vitals and nursing note reviewed.  HENT:     Head: Normocephalic.  Eyes:     Extraocular Movements: Extraocular movements intact.     Pupils: Pupils are equal, round, and reactive to light.  Cardiovascular:     Rate and  Rhythm: Normal rate and regular rhythm.     Heart sounds: Normal heart sounds.  Pulmonary:     Effort: Pulmonary effort is normal.     Breath sounds: Normal breath sounds.  Abdominal:     Palpations: Abdomen is soft.     Tenderness: There is no abdominal tenderness. There is no guarding.  Musculoskeletal:     Cervical back: Normal range of motion.     Comments: Moves all extremities spontaneously without difficulty  Skin:    General: Skin is warm and dry.  Neurological:     General: No focal deficit present.     Mental Status: He is alert and oriented to person, place, and time.     (all labs ordered are listed, but only abnormal results are displayed) Labs Reviewed  CBG MONITORING, ED - Abnormal; Notable for the following components:      Result Value   Glucose-Capillary 386 (*)    All other components within normal limits  I-STAT CHEM 8, ED - Abnormal; Notable for the following components:   Sodium 132 (*)    Potassium 5.2 (*)    Glucose, Bld 385 (*)    TCO2 21 (*)    All other components within normal limits  URINALYSIS, ROUTINE W REFLEX MICROSCOPIC  CBC WITH DIFFERENTIAL/PLATELET  BETA-HYDROXYBUTYRIC ACID  CBG MONITORING, ED    EKG: None  Radiology: CT Head Wo Contrast Result Date: 12/28/2023 CLINICAL DATA:  Mental status change EXAM: CT HEAD WITHOUT CONTRAST TECHNIQUE: Contiguous axial images were obtained from the base of the skull through the vertex without intravenous contrast. RADIATION DOSE REDUCTION: This exam was performed according to the departmental dose-optimization program which includes automated exposure control, adjustment of the mA and/or kV according to patient size and/or use of iterative reconstruction technique. COMPARISON:  CT brain 12/28/2023, MRI 05/16/2023 FINDINGS: Brain: No acute territorial infarction, hemorrhage or intracranial mass. Stable ventricle size. Mild chronic small vessel ischemic changes of the white matter. Stable small focus of  encephalomalacia at the right frontal lobe. Stable small focus of encephalomalacia within the left posterior white matter abutting the ventricle. The ventricles are nonenlarged Vascular: No hyperdense vessels.  Carotid vascular calcification Skull: Right anterior craniotomy.  No fracture Sinuses/Orbits: No acute finding. Other: None IMPRESSION: 1. No CT evidence for acute intracranial abnormality. 2. Mild chronic small vessel ischemic changes of the white matter. Right anterior craniotomy Electronically Signed   By: Luke Bun M.D.   On: 12/28/2023 21:00   CT ABDOMEN PELVIS W CONTRAST Result Date: 12/28/2023 CLINICAL DATA:  Chest and abdominal pain. History of non-small cell cancer. EXAM: CT ANGIOGRAPHY CHEST CT ABDOMEN AND PELVIS WITH CONTRAST TECHNIQUE: Multidetector CT imaging of the chest was performed using the standard protocol during bolus administration of intravenous contrast. Multiplanar CT image reconstructions  and MIPs were obtained to evaluate the vascular anatomy. Multidetector CT imaging of the abdomen and pelvis was performed using the standard protocol during bolus administration of intravenous contrast. RADIATION DOSE REDUCTION: This exam was performed according to the departmental dose-optimization program which includes automated exposure control, adjustment of the mA and/or kV according to patient size and/or use of iterative reconstruction technique. CONTRAST:  75mL OMNIPAQUE  IOHEXOL  350 MG/ML SOLN COMPARISON:  CT scan 09/10/2023 FINDINGS: CTA CHEST FINDINGS Cardiovascular: The heart is normal in size. No pericardial effusion. The aorta is normal in caliber. No dissection or atherosclerotic calcification. Scattered three-vessel coronary artery calcifications are noted. The pulmonary arterial tree is well opacified. No filling defects to suggest pulmonary embolism. Mediastinum/Nodes: Scattered mediastinal and hilar lymph nodes are stable. No mass or overt adenopathy. No new or progressive  changes. The esophagus is grossly normal. Lungs/Pleura: Stable underlying emphysematous changes and areas of pulmonary scarring. No infiltrates, edema or effusions. No worrisome pulmonary lesions or pulmonary nodules. Musculoskeletal: No supraclavicular or axillary adenopathy. The right IJ Port-A-Cath is stable. No worrisome lytic sclerotic bone lesions. Review of the MIP images confirms the above findings. CT ABDOMEN and PELVIS FINDINGS Hepatobiliary: Stable fatty change near the falciform ligament. No worrisome hepatic lesions or intrahepatic biliary dilatation. The gallbladder is normal. No common bile duct dilatation. Pancreas: Progressive diffuse pancreatic atrophy but no mass, inflammation or ductal dilatation. Spleen: Normal in size without focal abnormality. Adrenals/Urinary Tract: The adrenal glands are normal. No renal lesions or hydronephrosis. The bladder is unremarkable. Stomach/Bowel: The stomach, duodenum, small bowel and colon are grossly normal without oral contrast. No inflammatory changes, mass lesions or obstructive findings. The appendix is normal. Stable colonic diverticulosis. Vascular/Lymphatic: The aorta is normal in caliber. No dissection. Stable age advanced atherosclerotic calcifications. The branch vessels are patent. The major venous structures are patent. No mesenteric or retroperitoneal mass or adenopathy. Small scattered lymph nodes are noted. Reproductive: Prostate is unremarkable. Other: No pelvic mass or adenopathy. No free pelvic fluid collections. No inguinal mass or adenopathy. No abdominal wall hernia or subcutaneous lesions. Musculoskeletal: No significant bony findings. Review of the MIP images confirms the above findings. IMPRESSION: 1. No CT findings for pulmonary embolism. 2. Normal caliber thoracic aorta.  No dissection. 3. Stable emphysematous changes and areas of pulmonary scarring. 4. No acute abdominal/pelvic findings, mass lesions or adenopathy. 5. Progressive  diffuse pancreatic atrophy but no mass, inflammation or ductal dilatation. 6. Stable age advanced atherosclerotic calcifications involving the aorta and branch vessels including the coronary arteries. Aortic Atherosclerosis (ICD10-I70.0) and Emphysema (ICD10-J43.9). Electronically Signed   By: MYRTIS Stammer M.D.   On: 12/28/2023 14:33   CT Angio Chest PE W and/or Wo Contrast Result Date: 12/28/2023 CLINICAL DATA:  Chest and abdominal pain. History of non-small cell cancer. EXAM: CT ANGIOGRAPHY CHEST CT ABDOMEN AND PELVIS WITH CONTRAST TECHNIQUE: Multidetector CT imaging of the chest was performed using the standard protocol during bolus administration of intravenous contrast. Multiplanar CT image reconstructions and MIPs were obtained to evaluate the vascular anatomy. Multidetector CT imaging of the abdomen and pelvis was performed using the standard protocol during bolus administration of intravenous contrast. RADIATION DOSE REDUCTION: This exam was performed according to the departmental dose-optimization program which includes automated exposure control, adjustment of the mA and/or kV according to patient size and/or use of iterative reconstruction technique. CONTRAST:  75mL OMNIPAQUE  IOHEXOL  350 MG/ML SOLN COMPARISON:  CT scan 09/10/2023 FINDINGS: CTA CHEST FINDINGS Cardiovascular: The heart is normal in size. No pericardial effusion.  The aorta is normal in caliber. No dissection or atherosclerotic calcification. Scattered three-vessel coronary artery calcifications are noted. The pulmonary arterial tree is well opacified. No filling defects to suggest pulmonary embolism. Mediastinum/Nodes: Scattered mediastinal and hilar lymph nodes are stable. No mass or overt adenopathy. No new or progressive changes. The esophagus is grossly normal. Lungs/Pleura: Stable underlying emphysematous changes and areas of pulmonary scarring. No infiltrates, edema or effusions. No worrisome pulmonary lesions or pulmonary nodules.  Musculoskeletal: No supraclavicular or axillary adenopathy. The right IJ Port-A-Cath is stable. No worrisome lytic sclerotic bone lesions. Review of the MIP images confirms the above findings. CT ABDOMEN and PELVIS FINDINGS Hepatobiliary: Stable fatty change near the falciform ligament. No worrisome hepatic lesions or intrahepatic biliary dilatation. The gallbladder is normal. No common bile duct dilatation. Pancreas: Progressive diffuse pancreatic atrophy but no mass, inflammation or ductal dilatation. Spleen: Normal in size without focal abnormality. Adrenals/Urinary Tract: The adrenal glands are normal. No renal lesions or hydronephrosis. The bladder is unremarkable. Stomach/Bowel: The stomach, duodenum, small bowel and colon are grossly normal without oral contrast. No inflammatory changes, mass lesions or obstructive findings. The appendix is normal. Stable colonic diverticulosis. Vascular/Lymphatic: The aorta is normal in caliber. No dissection. Stable age advanced atherosclerotic calcifications. The branch vessels are patent. The major venous structures are patent. No mesenteric or retroperitoneal mass or adenopathy. Small scattered lymph nodes are noted. Reproductive: Prostate is unremarkable. Other: No pelvic mass or adenopathy. No free pelvic fluid collections. No inguinal mass or adenopathy. No abdominal wall hernia or subcutaneous lesions. Musculoskeletal: No significant bony findings. Review of the MIP images confirms the above findings. IMPRESSION: 1. No CT findings for pulmonary embolism. 2. Normal caliber thoracic aorta.  No dissection. 3. Stable emphysematous changes and areas of pulmonary scarring. 4. No acute abdominal/pelvic findings, mass lesions or adenopathy. 5. Progressive diffuse pancreatic atrophy but no mass, inflammation or ductal dilatation. 6. Stable age advanced atherosclerotic calcifications involving the aorta and branch vessels including the coronary arteries. Aortic Atherosclerosis  (ICD10-I70.0) and Emphysema (ICD10-J43.9). Electronically Signed   By: MYRTIS Stammer M.D.   On: 12/28/2023 14:33   CT Head Wo Contrast Result Date: 12/28/2023 CLINICAL DATA:  Headache, increasing frequency or severity hx cancer EXAM: CT HEAD WITHOUT CONTRAST TECHNIQUE: Contiguous axial images were obtained from the base of the skull through the vertex without intravenous contrast. RADIATION DOSE REDUCTION: This exam was performed according to the departmental dose-optimization program which includes automated exposure control, adjustment of the mA and/or kV according to patient size and/or use of iterative reconstruction technique. COMPARISON:  MRI head 03/20/2019 FINDINGS: Brain: Patchy and confluent areas of decreased attenuation are noted throughout the deep and periventricular white matter of the cerebral hemispheres bilaterally, compatible with chronic microvascular ischemic disease. No evidence of large-territorial acute infarction. No parenchymal hemorrhage. No mass lesion. No extra-axial collection. No mass effect or midline shift. No hydrocephalus. Basilar cisterns are patent. Vascular: No hyperdense vessel. Atherosclerotic calcifications are present within the cavernous internal carotid arteries. Skull: No acute fracture or focal lesion.  Prior right burr holes. Sinuses/Orbits: Paranasal sinuses and mastoid air cells are clear. The orbits are unremarkable. Other: None. IMPRESSION: No acute intracranial abnormality. Electronically Signed   By: Morgane  Naveau M.D.   On: 12/28/2023 14:20     Procedures   Medications Ordered in the ED  lactated ringers  bolus 1,000 mL (1,000 mLs Intravenous New Bag/Given 12/29/23 1854)  Medical Decision Making This patient presents to the ED for concern of hyperglycemia, this involves an extensive number of treatment options, and is a complaint that carries with it a high risk of complications and morbidity.  The differential  diagnosis includes diabetic ketoacidosis, HHS, poorly controlled type 2 diabetes, altered mental status   Co morbidities that complicate the patient evaluation  Type 2 diabetes, non-small cell lung cancer with brain mets   Additional history obtained:  Additional history obtained from record review External records from outside source obtained and reviewed including recent ED notes, recent hospital discharge note where patient left AGAINST MEDICAL ADVICE   Lab Tests:  I Ordered, and personally interpreted labs.  The pertinent results include: Initial CBG notable for blood glucose of 386.  I-STAT Chem-8 notable for mild hyponatremia with sodium of 132, mild hyperkalemia with potassium of 5.2.  CBC notable for downtrending hemoglobin of 12.8 as compared to 13.6-15 yesterday, however this is similar to his baseline.  VBG notable for pCO2 of 39, PaO2 of 31, acid base deficit of 5.6, with pH of 7.32.  Beta hydroxybutyric acid is elevated at 6.38, was found to be 4.43 yesterday.  CMP pending at time of shift change.  Cardiac Monitoring: / EKG:  The patient was maintained on a cardiac monitor.  I personally viewed and interpreted the cardiac monitored which showed an underlying rhythm of: NSR   Consultations Obtained:  I requested consultation with the hospitalist, consult call was pending at time of shift change.   Problem List / ED Course / Critical interventions / Medication management  LR bolus for rehydration, Endo tool for diabetic ketoacidosis I have reviewed the patients home medicines and have made adjustments as needed   Social Determinants of Health:  Tobacco use, housing instability, financial instability   Test / Admission - Considered:  Physical exam is notable as above.  Patient was admitted to [facility] yesterday for hyperglycemia with suspected DKA, he was started on Endo tool and left AGAINST MEDICAL ADVICE, patient tells me that he left because he was confused  and did not know where he was.  Patient is A&O x4 at time of my examination, but his son tells me that he is more confused than his baseline, gets sidetracked easily and clearly was experiencing confusion yesterday when he left AGAINST MEDICAL ADVICE.  Patient had extensive workup yesterday including CT imaging of his head/abdomen/pelvis and a CT PE study, I do not feel that it is necessary to repeat this imaging at this time.  I suspect the patient would likely benefit from admission given his continued hyperglycemia with findings notable as above, he is likely in diabetic ketoacidosis.  Endo tool protocol initiated prior to shift change.  Staffed with Dr. Franklyn, handed off at shift change with labs and hospital consult pending  Amount and/or Complexity of Data Reviewed Labs: ordered.  Risk Prescription drug management. Decision regarding hospitalization.        Final diagnoses:  Diabetic ketoacidosis without coma associated with type 2 diabetes mellitus [facility])    ED Discharge Orders     None          Glendia Rocky LOISE DEVONNA 12/29/23 2014    Franklyn Sid LOISE, MD 12/31/23 [phone]

## 2023-12-30 ENCOUNTER — Other Ambulatory Visit (HOSPITAL_COMMUNITY): Payer: Self-pay

## 2023-12-30 ENCOUNTER — Telehealth (HOSPITAL_COMMUNITY): Payer: Self-pay | Admitting: Pharmacy Technician

## 2023-12-30 ENCOUNTER — Encounter: Payer: Self-pay | Admitting: Internal Medicine

## 2023-12-30 DIAGNOSIS — Z7982 Long term (current) use of aspirin: Secondary | ICD-10-CM | POA: Diagnosis not present

## 2023-12-30 DIAGNOSIS — E785 Hyperlipidemia, unspecified: Secondary | ICD-10-CM | POA: Diagnosis present

## 2023-12-30 DIAGNOSIS — Z59819 Housing instability, housed unspecified: Secondary | ICD-10-CM | POA: Diagnosis not present

## 2023-12-30 DIAGNOSIS — G40909 Epilepsy, unspecified, not intractable, without status epilepticus: Secondary | ICD-10-CM | POA: Diagnosis present

## 2023-12-30 DIAGNOSIS — Z794 Long term (current) use of insulin: Secondary | ICD-10-CM | POA: Diagnosis not present

## 2023-12-30 DIAGNOSIS — G4700 Insomnia, unspecified: Secondary | ICD-10-CM | POA: Diagnosis present

## 2023-12-30 DIAGNOSIS — E1110 Type 2 diabetes mellitus with ketoacidosis without coma: Secondary | ICD-10-CM | POA: Diagnosis present

## 2023-12-30 DIAGNOSIS — F03918 Unspecified dementia, unspecified severity, with other behavioral disturbance: Secondary | ICD-10-CM | POA: Diagnosis present

## 2023-12-30 DIAGNOSIS — Z880 Allergy status to penicillin: Secondary | ICD-10-CM | POA: Diagnosis not present

## 2023-12-30 DIAGNOSIS — Z87891 Personal history of nicotine dependence: Secondary | ICD-10-CM | POA: Diagnosis not present

## 2023-12-30 DIAGNOSIS — I2510 Atherosclerotic heart disease of native coronary artery without angina pectoris: Secondary | ICD-10-CM | POA: Diagnosis present

## 2023-12-30 DIAGNOSIS — I110 Hypertensive heart disease with heart failure: Secondary | ICD-10-CM | POA: Diagnosis present

## 2023-12-30 DIAGNOSIS — Z85841 Personal history of malignant neoplasm of brain: Secondary | ICD-10-CM | POA: Diagnosis not present

## 2023-12-30 DIAGNOSIS — Z888 Allergy status to other drugs, medicaments and biological substances status: Secondary | ICD-10-CM | POA: Diagnosis not present

## 2023-12-30 DIAGNOSIS — Z79899 Other long term (current) drug therapy: Secondary | ICD-10-CM | POA: Diagnosis not present

## 2023-12-30 DIAGNOSIS — Z8673 Personal history of transient ischemic attack (TIA), and cerebral infarction without residual deficits: Secondary | ICD-10-CM | POA: Diagnosis not present

## 2023-12-30 DIAGNOSIS — Z8249 Family history of ischemic heart disease and other diseases of the circulatory system: Secondary | ICD-10-CM | POA: Diagnosis not present

## 2023-12-30 DIAGNOSIS — Z7984 Long term (current) use of oral hypoglycemic drugs: Secondary | ICD-10-CM | POA: Diagnosis not present

## 2023-12-30 DIAGNOSIS — Z833 Family history of diabetes mellitus: Secondary | ICD-10-CM | POA: Diagnosis not present

## 2023-12-30 DIAGNOSIS — G9341 Metabolic encephalopathy: Secondary | ICD-10-CM | POA: Diagnosis present

## 2023-12-30 DIAGNOSIS — I5022 Chronic systolic (congestive) heart failure: Secondary | ICD-10-CM | POA: Diagnosis present

## 2023-12-30 DIAGNOSIS — B1920 Unspecified viral hepatitis C without hepatic coma: Secondary | ICD-10-CM | POA: Diagnosis present

## 2023-12-30 DIAGNOSIS — Z85118 Personal history of other malignant neoplasm of bronchus and lung: Secondary | ICD-10-CM | POA: Diagnosis not present

## 2023-12-30 DIAGNOSIS — Z5986 Financial insecurity: Secondary | ICD-10-CM | POA: Diagnosis not present

## 2023-12-30 LAB — BASIC METABOLIC PANEL WITH GFR
Anion gap: 12 (ref 5–15)
Anion gap: 15 (ref 5–15)
Anion gap: 9 (ref 5–15)
BUN: 12 mg/dL (ref 8–23)
BUN: 14 mg/dL (ref 8–23)
BUN: 16 mg/dL (ref 8–23)
CO2: 21 mmol/L — ABNORMAL LOW (ref 22–32)
CO2: 22 mmol/L (ref 22–32)
CO2: 24 mmol/L (ref 22–32)
Calcium: 8.6 mg/dL — ABNORMAL LOW (ref 8.9–10.3)
Calcium: 8.9 mg/dL (ref 8.9–10.3)
Calcium: 9.4 mg/dL (ref 8.9–10.3)
Chloride: 100 mmol/L (ref 98–111)
Chloride: 101 mmol/L (ref 98–111)
Chloride: 99 mmol/L (ref 98–111)
Creatinine, Ser: 0.5 mg/dL — ABNORMAL LOW (ref 0.61–1.24)
Creatinine, Ser: 0.5 mg/dL — ABNORMAL LOW (ref 0.61–1.24)
Creatinine, Ser: 0.61 mg/dL (ref 0.61–1.24)
GFR, Estimated: >60 mL/min (ref >60)
GFR, Estimated: >60 mL/min (ref >60)
GFR, Estimated: >60 mL/min (ref >60)
Glucose, Bld: 136 mg/dL — ABNORMAL HIGH (ref 70–99)
Glucose, Bld: 272 mg/dL — ABNORMAL HIGH (ref 70–99)
Glucose, Bld: 93 mg/dL (ref 70–99)
Potassium: 3.2 mmol/L — ABNORMAL LOW (ref 3.5–5.1)
Potassium: 3.4 mmol/L — ABNORMAL LOW (ref 3.5–5.1)
Potassium: 3.4 mmol/L — ABNORMAL LOW (ref 3.5–5.1)
Sodium: 133 mmol/L — ABNORMAL LOW (ref 135–145)
Sodium: 134 mmol/L — ABNORMAL LOW (ref 135–145)
Sodium: 136 mmol/L (ref 135–145)

## 2023-12-30 LAB — GLUCOSE, CAPILLARY
Glucose-Capillary: 115 mg/dL — ABNORMAL HIGH (ref 70–99)
Glucose-Capillary: 127 mg/dL — ABNORMAL HIGH (ref 70–99)
Glucose-Capillary: 146 mg/dL — ABNORMAL HIGH (ref 70–99)
Glucose-Capillary: 172 mg/dL — ABNORMAL HIGH (ref 70–99)
Glucose-Capillary: 182 mg/dL — ABNORMAL HIGH (ref 70–99)
Glucose-Capillary: 156 mg/dL — ABNORMAL HIGH (ref 70–99)
Glucose-Capillary: 142 mg/dL — ABNORMAL HIGH (ref 70–99)
Glucose-Capillary: 133 mg/dL — ABNORMAL HIGH (ref 70–99)
Glucose-Capillary: 149 mg/dL — ABNORMAL HIGH (ref 70–99)
Glucose-Capillary: 171 mg/dL — ABNORMAL HIGH (ref 70–99)
Glucose-Capillary: 195 mg/dL — ABNORMAL HIGH (ref 70–99)
Glucose-Capillary: 165 mg/dL — ABNORMAL HIGH (ref 70–99)
Glucose-Capillary: 208 mg/dL — ABNORMAL HIGH (ref 70–99)
Glucose-Capillary: 223 mg/dL — ABNORMAL HIGH (ref 70–99)
Glucose-Capillary: 151 mg/dL — ABNORMAL HIGH (ref 70–99)
Glucose-Capillary: 227 mg/dL — ABNORMAL HIGH (ref 70–99)

## 2023-12-30 LAB — BETA-HYDROXYBUTYRIC ACID
Beta-Hydroxybutyric Acid: 1.12 mmol/L — ABNORMAL HIGH (ref 0.05–0.27)
Beta-Hydroxybutyric Acid: 2.16 mmol/L — ABNORMAL HIGH (ref 0.05–0.27)
Beta-Hydroxybutyric Acid: 2.84 mmol/L — ABNORMAL HIGH (ref 0.05–0.27)

## 2023-12-30 LAB — MRSA NEXT GEN BY PCR, NASAL: MRSA by PCR Next Gen: NOT DETECTED

## 2023-12-30 LAB — MAGNESIUM: Magnesium: 1.4 mg/dL — ABNORMAL LOW (ref 1.7–2.4)

## 2023-12-30 MED ORDER — INSULIN GLARGINE-YFGN 100 UNIT/ML ~~LOC~~ SOPN: 18.0000 [IU] | PEN_INJECTOR | SUBCUTANEOUS | Status: DC

## 2023-12-30 MED ORDER — ONDANSETRON HCL 4 MG/2ML IJ SOLN: 4.0000 mg | Freq: Four times a day (QID) | INTRAMUSCULAR | Status: DC | PRN

## 2023-12-30 MED ORDER — SODIUM CHLORIDE 0.9% FLUSH
10.0000 mL | INTRAVENOUS | Status: DC | PRN
  Administered 2023-12-31: 10 mL

## 2023-12-30 MED ORDER — INSULIN ASPART 100 UNIT/ML IJ SOLN
0.0000 [IU] | Freq: Three times a day (TID) | INTRAMUSCULAR | Status: DC
  Administered 2023-12-30: 3 [IU] via SUBCUTANEOUS
  Administered 2023-12-30: 5 [IU] via SUBCUTANEOUS
  Administered 2023-12-31: 11 [IU] via SUBCUTANEOUS
  Administered 2023-12-31: 5 [IU] via SUBCUTANEOUS
  Administered 2023-12-31: 2 [IU] via SUBCUTANEOUS
  Administered 2024-01-01: 8 [IU] via SUBCUTANEOUS

## 2023-12-30 MED ORDER — INSULIN GLARGINE-YFGN 100 UNIT/ML ~~LOC~~ SOLN
18.0000 [IU] | Freq: Every day | SUBCUTANEOUS | Status: DC
  Administered 2023-12-30 – 2023-12-31 (×2): 18 [IU] via SUBCUTANEOUS
  Filled 2023-12-30 (×2): qty 0.18

## 2023-12-30 MED ORDER — INSULIN GLARGINE 100 UNIT/ML ~~LOC~~ SOLN
18.0000 [IU] | Freq: Every day | SUBCUTANEOUS | Status: DC
  Filled 2023-12-30: qty 0.18

## 2023-12-30 MED ORDER — POTASSIUM CHLORIDE CRYS ER 20 MEQ PO TBCR
40.0000 meq | EXTENDED_RELEASE_TABLET | Freq: Once | ORAL | Status: AC
  Administered 2023-12-30: 40 meq via ORAL
  Filled 2023-12-30: qty 2

## 2023-12-30 MED ORDER — MAGNESIUM SULFATE 2 GM/50ML IV SOLN
2.0000 g | Freq: Once | INTRAVENOUS | Status: AC
  Administered 2023-12-30: 2 g via INTRAVENOUS
  Filled 2023-12-30: qty 50

## 2023-12-30 MED ORDER — POTASSIUM CHLORIDE CRYS ER 20 MEQ PO TBCR
20.0000 meq | EXTENDED_RELEASE_TABLET | Freq: Once | ORAL | Status: AC
  Administered 2023-12-30: 20 meq via ORAL
  Filled 2023-12-30: qty 1

## 2023-12-30 NOTE — Inpatient Diabetes Management (Addendum)
 MD- Note pt will need insulin  for home given current A1c.  Not sure pt will be able to safely give himself insulin  independently.  Son willing to help but works 3rd shift (9pm to 5:30am) and has to take children to school and sleep prior to work.  Perhaps we could start once daily basal insulin  for pt (Lantus  is $0 co-pay per OP pharmacy) and have pt take his Metformin  BID?   Met w/ pt an son [name] at bedside.  Pt was awake and alert but did not seem 100% coherent of the situation.  Son told me pt has been having some issues with confusion.  Pt does not remember leaving the hospital AMA 2 days prior.  Most of the education/discussion targeted with's pt's son.  Son lives with pt but does work 3rd shift from 9pm until 5:30am.  Also has small children at home.  Spoke with patient and son about pt's current A1c of 13.4%.  Explained what an A1c is and what it measures.  Reminded patient/son that his goal A1c is 7% or less per ADA standards to prevent both acute and long-term complications.  Reviewed with pt and son that last A1c was 6.1% and that this huge rise in A1c level shows us  that pt's CBGs have been dangerously high at home.  Pt and son endorse that pt has been having increased thirst, increased urination, and fatigue for about 1 mos now.  Pt has gotten immunotherapy for his cancer and this may have led to poor glucose control.  Explained to patient and son the extreme importance of good glucose control at home.  Encouraged patient/son to check pt;s CBGs at least bid at home (fasting and another check within the day) and to record all CBGs in a logbook for pt's PCP to review.  Looks like [facility] to [facility].  Needs follow up appt.  Son brought pt's home glucometer to the hospital.  We looked through the bag and there were 2 boxes of expired strips (expired 2023) in the bag.  I threw these away.  There was one box of good strips that were filled in Aug 2024--They had not been opened and  expire in Nov 2025.  There we a few lancets in the bag and a lancet device along with a meter.  I showed pt's son [name] how to use both the lancet device and the meter and explained how to get a drop of blood on the strip.  Meter has low batteries so I asked son to go to the [employer] tree and get 2 new batteries as well.  Son said he would be interested in patient having CGM for home.  Pt's phone is no compatible and I explained to son that the CGM cannot be connected to son's phone b/c the phone needs to be near the sensor.  I did explain to son that if pt's insurance will cover a CGM, we can get the MD to also order a reader device for home.  I have sent a request to OP pharmacy to check on both insulin  pricing and to see if CGM covered.    Educated patient's son on insulin  pen use at home.  Reviewed all steps of insulin  pen including attachment of needle, 2-unit air shot, dialing up dose, giving injection, rotation of injection sites, removing needle, disposal of sharps, storage of unused insulin , disposal of insulin  etc.  Patient's son able to provide successful return demonstration.  Reviewed troubleshooting with  insulin  pen.  Also reviewed Signs/Symptoms of Hypoglycemia with patient's son and how to treat Hypoglycemia at home.    Also discussed DM diet information with patient's son.  Encouraged patient to avoid beverages with sugar (regular soda, sweet tea, lemonade, fruit juice) and to consume mostly water.  Discussed what foods contain carbohydrates and how carbohydrates affect the body's blood sugar levels.  Encouraged patient to be careful with his portion sizes (especially grains, starchy vegetables, and fruits).     --Will follow patient during hospitalization--  Adina Rudolpho Arrow RN, MSN, CDCES Diabetes Coordinator Inpatient Glycemic Control Team Team Pager: [phone] (8a-5p)

## 2023-12-30 NOTE — Progress Notes (Signed)
 PROGRESS NOTE  Jared Shea.  FMW:[id number] DOB: 06/13/1955 DOA: 12/29/2023 PCP: Elicia Sharper, DO   Brief Narrative: Patient is a 68 year old male with history of diabetes type 2, small cell lung cancer with brain mets status post tumor resection/chemo/radiation/immunotherapy and currently on observation, HFmrEF, CAD, hypertension, hyperlipidemia, hep C, seizure disorder, headaches, insomnia presented to the ED  with abdominal pain, nausea, vomiting and was admitted for management for DKA.  A1c found to be in the range of 13.  Takes metformin  at home.  Plan is to transition to subcu insulin .  Diabetic coordinator following.  Assessment & Plan:  Principal Problem:   DKA (diabetic ketoacidosis) (HCC) Active Problems:   Malignant neoplasm metastatic to brain [facility])   Acute metabolic encephalopathy   Chronic congestive heart failure (HCC)   CAD (coronary artery disease)    DKA/poorly controlled diabetes type 2: Report of taking a lot of soda drinks at home.  Takes metformin  at home.  A1c was 6.1 in April , 2025.  Now in the range of 13.  Diabetic coordinator following.  He will need insulin  at discharge.  Currently on insulin  drip.  Beta-hydroxybutyrate level is still elevated.  Will transition to subcu insulin  today.  Needs insulin  on discharge.  Acute metabolic encephalopathy: Secondary to DKA.  CT head negative for acute intake abnormalities.  Currently alert and oriented.  Metastatic small cell lung cancer: Follows with oncology.Status post brain tumor resection/chemo/radiation/immunotherapy    Chronic HFmrEF: Last echo showed EF of 50 to 55%, grade 1 diastolic dysfunction.  Currently euvolemic  History of coronary artery disease: No anginal symptoms.  Takes aspirin   Hypertension: Currently blood pressure stable.  Takes Imdur , metoprolol  at home.  Currently being continued  Seizure disorder: On Keppra   Hyperlipidemia:       DVT prophylaxis:enoxaparin  (LOVENOX ) injection 40  mg Start: 12/30/23 1000     Code Status: Full Code  Family Communication:   Patient status:Obs  Patient is from :home  Anticipated discharge un:ynfz  Estimated DC date:tomorrow   Consultants: none  Procedures:none  Antimicrobials:  Anti-infectives (From admission, onward)    None       Subjective: Patient seen and examined at bedside.  Currently on insulin  drip.  No nausea vomiting or abdominal pain.  Wants to eat some solid food.  Son at the bedside.  No new complaints  Objective: Vitals:   12/30/23 0200 12/30/23 0300 12/30/23 0400 12/30/23 0500  BP: 127/63 127/62 129/66 (!) 166/71  Pulse: 77 73 70 72  Resp: (!) 25  19 17   Temp:   98 F (36.7 C)   TempSrc:   Oral   SpO2: 93% 97% 96% 96%  Weight:      Height:        Intake/Output Summary (Last 24 hours) at 12/30/2023 0742 Last data filed at 12/30/2023 0644 Gross per 24 hour  Intake 1304.85 ml  Output 250 ml  Net 1054.85 ml   Filed Weights   12/29/23 1800 12/29/23 2330  Weight: 73.9 kg 73.5 kg    Examination:  General exam: Overall comfortable, not in distress HEENT: PERRL Respiratory system:  no wheezes or crackles  Cardiovascular system: S1 & S2 heard, RRR.  Chemo-Port on the right chest Gastrointestinal system: Abdomen is nondistended, soft and nontender. Central nervous system: Alert and oriented Extremities: No edema, no clubbing ,no cyanosis Skin: No rashes, no ulcers,no icterus     Data Reviewed: I have personally reviewed following labs and imaging studies  CBC: Recent Labs  Lab 12/28/23 0959 12/28/23 1208 12/28/23 1938 12/29/23 1834 12/29/23 1838  WBC 8.7  --  10.5  --  8.1  NEUTROABS  --   --  7.4  --  5.9  HGB 14.4 15.0 14.0 13.6 12.8*  HCT 41.9 44.0 39.8 40.0 38.9*  MCV 85.7  --  83.8  --  87.0  PLT 447*  --  481*  --  435*   Basic Metabolic Panel: Recent Labs  Lab 12/28/23 1312 12/28/23 1938 12/29/23 1834 12/29/23 1838 12/30/23 0007 12/30/23 0416  NA 130* 136  132* 132* 136 133*  K 5.4* 3.8 5.2* 4.3 3.2* 3.4*  CL 95* 100 98 95* 100 99  CO2 17* 18*  --  17* 21* 22  GLUCOSE 573* 183* 385* 379* 93 272*  BUN 28* 21 21 19 16 14   CREATININE 1.11 0.87 0.70 1.01 0.61 0.50*  CALCIUM  10.4* 10.2  --  9.7 9.4 8.6*  MG  --   --   --   --  1.4*  --      Recent Results (from the past 240 hours)  MRSA Next Gen by PCR, Nasal     Status: None   Collection Time: 12/29/23 11:24 PM   Specimen: Nasal Mucosa; Nasal Swab  Result Value Ref Range Status   MRSA by PCR Next Gen NOT DETECTED NOT DETECTED Final    Comment: (NOTE) The GeneXpert MRSA Assay (FDA approved for NASAL specimens only), is one component of a comprehensive MRSA colonization surveillance program. It is not intended to diagnose MRSA infection nor to guide or monitor treatment for MRSA infections. Test performance is not FDA approved in patients less than 49 years old. Performed at [facility], [address] [place], [place] [address]      Radiology Studies: CT Head Wo Contrast Result Date: 12/28/2023 CLINICAL DATA:  Mental status change EXAM: CT HEAD WITHOUT CONTRAST TECHNIQUE: Contiguous axial images were obtained from the base of the skull through the vertex without intravenous contrast. RADIATION DOSE REDUCTION: This exam was performed according to the departmental dose-optimization program which includes automated exposure control, adjustment of the mA and/or kV according to patient size and/or use of iterative reconstruction technique. COMPARISON:  CT brain 12/28/2023, MRI 05/16/2023 FINDINGS: Brain: No acute territorial infarction, hemorrhage or intracranial mass. Stable ventricle size. Mild chronic small vessel ischemic changes of the white matter. Stable small focus of encephalomalacia at the right frontal lobe. Stable small focus of encephalomalacia within the left posterior white matter abutting the ventricle. The ventricles are nonenlarged Vascular: No hyperdense  vessels.  Carotid vascular calcification Skull: Right anterior craniotomy.  No fracture Sinuses/Orbits: No acute finding. Other: None IMPRESSION: 1. No CT evidence for acute intracranial abnormality. 2. Mild chronic small vessel ischemic changes of the white matter. Right anterior craniotomy Electronically Signed   By: Luke Bun M.D.   On: 12/28/2023 21:00   CT ABDOMEN PELVIS W CONTRAST Result Date: 12/28/2023 CLINICAL DATA:  Chest and abdominal pain. History of non-small cell cancer. EXAM: CT ANGIOGRAPHY CHEST CT ABDOMEN AND PELVIS WITH CONTRAST TECHNIQUE: Multidetector CT imaging of the chest was performed using the standard protocol during bolus administration of intravenous contrast. Multiplanar CT image reconstructions and MIPs were obtained to evaluate the vascular anatomy. Multidetector CT imaging of the abdomen and pelvis was performed using the standard protocol during bolus administration of intravenous contrast. RADIATION DOSE REDUCTION: This exam was performed according to the departmental dose-optimization program which includes automated exposure control, adjustment  of the mA and/or kV according to patient size and/or use of iterative reconstruction technique. CONTRAST:  75mL OMNIPAQUE  IOHEXOL  350 MG/ML SOLN COMPARISON:  CT scan 09/10/2023 FINDINGS: CTA CHEST FINDINGS Cardiovascular: The heart is normal in size. No pericardial effusion. The aorta is normal in caliber. No dissection or atherosclerotic calcification. Scattered three-vessel coronary artery calcifications are noted. The pulmonary arterial tree is well opacified. No filling defects to suggest pulmonary embolism. Mediastinum/Nodes: Scattered mediastinal and hilar lymph nodes are stable. No mass or overt adenopathy. No new or progressive changes. The esophagus is grossly normal. Lungs/Pleura: Stable underlying emphysematous changes and areas of pulmonary scarring. No infiltrates, edema or effusions. No worrisome pulmonary lesions or  pulmonary nodules. Musculoskeletal: No supraclavicular or axillary adenopathy. The right IJ Port-A-Cath is stable. No worrisome lytic sclerotic bone lesions. Review of the MIP images confirms the above findings. CT ABDOMEN and PELVIS FINDINGS Hepatobiliary: Stable fatty change near the falciform ligament. No worrisome hepatic lesions or intrahepatic biliary dilatation. The gallbladder is normal. No common bile duct dilatation. Pancreas: Progressive diffuse pancreatic atrophy but no mass, inflammation or ductal dilatation. Spleen: Normal in size without focal abnormality. Adrenals/Urinary Tract: The adrenal glands are normal. No renal lesions or hydronephrosis. The bladder is unremarkable. Stomach/Bowel: The stomach, duodenum, small bowel and colon are grossly normal without oral contrast. No inflammatory changes, mass lesions or obstructive findings. The appendix is normal. Stable colonic diverticulosis. Vascular/Lymphatic: The aorta is normal in caliber. No dissection. Stable age advanced atherosclerotic calcifications. The branch vessels are patent. The major venous structures are patent. No mesenteric or retroperitoneal mass or adenopathy. Small scattered lymph nodes are noted. Reproductive: Prostate is unremarkable. Other: No pelvic mass or adenopathy. No free pelvic fluid collections. No inguinal mass or adenopathy. No abdominal wall hernia or subcutaneous lesions. Musculoskeletal: No significant bony findings. Review of the MIP images confirms the above findings. IMPRESSION: 1. No CT findings for pulmonary embolism. 2. Normal caliber thoracic aorta.  No dissection. 3. Stable emphysematous changes and areas of pulmonary scarring. 4. No acute abdominal/pelvic findings, mass lesions or adenopathy. 5. Progressive diffuse pancreatic atrophy but no mass, inflammation or ductal dilatation. 6. Stable age advanced atherosclerotic calcifications involving the aorta and branch vessels including the coronary arteries.  Aortic Atherosclerosis (ICD10-I70.0) and Emphysema (ICD10-J43.9). Electronically Signed   By: MYRTIS Stammer M.D.   On: 12/28/2023 14:33   CT Angio Chest PE W and/or Wo Contrast Result Date: 12/28/2023 CLINICAL DATA:  Chest and abdominal pain. History of non-small cell cancer. EXAM: CT ANGIOGRAPHY CHEST CT ABDOMEN AND PELVIS WITH CONTRAST TECHNIQUE: Multidetector CT imaging of the chest was performed using the standard protocol during bolus administration of intravenous contrast. Multiplanar CT image reconstructions and MIPs were obtained to evaluate the vascular anatomy. Multidetector CT imaging of the abdomen and pelvis was performed using the standard protocol during bolus administration of intravenous contrast. RADIATION DOSE REDUCTION: This exam was performed according to the departmental dose-optimization program which includes automated exposure control, adjustment of the mA and/or kV according to patient size and/or use of iterative reconstruction technique. CONTRAST:  75mL OMNIPAQUE  IOHEXOL  350 MG/ML SOLN COMPARISON:  CT scan 09/10/2023 FINDINGS: CTA CHEST FINDINGS Cardiovascular: The heart is normal in size. No pericardial effusion. The aorta is normal in caliber. No dissection or atherosclerotic calcification. Scattered three-vessel coronary artery calcifications are noted. The pulmonary arterial tree is well opacified. No filling defects to suggest pulmonary embolism. Mediastinum/Nodes: Scattered mediastinal and hilar lymph nodes are stable. No mass or overt adenopathy. No new  or progressive changes. The esophagus is grossly normal. Lungs/Pleura: Stable underlying emphysematous changes and areas of pulmonary scarring. No infiltrates, edema or effusions. No worrisome pulmonary lesions or pulmonary nodules. Musculoskeletal: No supraclavicular or axillary adenopathy. The right IJ Port-A-Cath is stable. No worrisome lytic sclerotic bone lesions. Review of the MIP images confirms the above findings. CT  ABDOMEN and PELVIS FINDINGS Hepatobiliary: Stable fatty change near the falciform ligament. No worrisome hepatic lesions or intrahepatic biliary dilatation. The gallbladder is normal. No common bile duct dilatation. Pancreas: Progressive diffuse pancreatic atrophy but no mass, inflammation or ductal dilatation. Spleen: Normal in size without focal abnormality. Adrenals/Urinary Tract: The adrenal glands are normal. No renal lesions or hydronephrosis. The bladder is unremarkable. Stomach/Bowel: The stomach, duodenum, small bowel and colon are grossly normal without oral contrast. No inflammatory changes, mass lesions or obstructive findings. The appendix is normal. Stable colonic diverticulosis. Vascular/Lymphatic: The aorta is normal in caliber. No dissection. Stable age advanced atherosclerotic calcifications. The branch vessels are patent. The major venous structures are patent. No mesenteric or retroperitoneal mass or adenopathy. Small scattered lymph nodes are noted. Reproductive: Prostate is unremarkable. Other: No pelvic mass or adenopathy. No free pelvic fluid collections. No inguinal mass or adenopathy. No abdominal wall hernia or subcutaneous lesions. Musculoskeletal: No significant bony findings. Review of the MIP images confirms the above findings. IMPRESSION: 1. No CT findings for pulmonary embolism. 2. Normal caliber thoracic aorta.  No dissection. 3. Stable emphysematous changes and areas of pulmonary scarring. 4. No acute abdominal/pelvic findings, mass lesions or adenopathy. 5. Progressive diffuse pancreatic atrophy but no mass, inflammation or ductal dilatation. 6. Stable age advanced atherosclerotic calcifications involving the aorta and branch vessels including the coronary arteries. Aortic Atherosclerosis (ICD10-I70.0) and Emphysema (ICD10-J43.9). Electronically Signed   By: MYRTIS Stammer M.D.   On: 12/28/2023 14:33   CT Head Wo Contrast Result Date: 12/28/2023 CLINICAL DATA:  Headache,  increasing frequency or severity hx cancer EXAM: CT HEAD WITHOUT CONTRAST TECHNIQUE: Contiguous axial images were obtained from the base of the skull through the vertex without intravenous contrast. RADIATION DOSE REDUCTION: This exam was performed according to the departmental dose-optimization program which includes automated exposure control, adjustment of the mA and/or kV according to patient size and/or use of iterative reconstruction technique. COMPARISON:  MRI head 03/20/2019 FINDINGS: Brain: Patchy and confluent areas of decreased attenuation are noted throughout the deep and periventricular white matter of the cerebral hemispheres bilaterally, compatible with chronic microvascular ischemic disease. No evidence of large-territorial acute infarction. No parenchymal hemorrhage. No mass lesion. No extra-axial collection. No mass effect or midline shift. No hydrocephalus. Basilar cisterns are patent. Vascular: No hyperdense vessel. Atherosclerotic calcifications are present within the cavernous internal carotid arteries. Skull: No acute fracture or focal lesion.  Prior right burr holes. Sinuses/Orbits: Paranasal sinuses and mastoid air cells are clear. The orbits are unremarkable. Other: None. IMPRESSION: No acute intracranial abnormality. Electronically Signed   By: Morgane  Naveau M.D.   On: 12/28/2023 14:20    Scheduled Meds:  aspirin  EC  81 mg Oral Daily   Chlorhexidine  Gluconate Cloth  6 each Topical Daily   enoxaparin  (LOVENOX ) injection  40 mg Subcutaneous Q24H   isosorbide  mononitrate  60 mg Oral Daily   levETIRAcetam   750 mg Oral BID   metoprolol  succinate  12.5 mg Oral QHS   potassium chloride   40 mEq Oral Once   Continuous Infusions:  dextrose  5% lactated ringers  125 mL/hr at 12/30/23 0644   insulin  2.6 Units/hr (12/30/23 0644)  lactated ringers  Stopped (12/29/23 2147)     LOS: 0 days   Ivonne Mustache, MD [facility] P8/18/2025, 7:42 AM

## 2023-12-30 NOTE — Plan of Care (Signed)
  Problem: Clinical Measurements: Goal: Respiratory complications will improve Outcome: Progressing Goal: Cardiovascular complication will be avoided Outcome: Progressing   Problem: Nutrition: Goal: Adequate nutrition will be maintained Outcome: Not Progressing   Problem: Pain Managment: Goal: General experience of comfort will improve and/or be controlled Outcome: Not Progressing

## 2023-12-30 NOTE — Telephone Encounter (Signed)
Per chart, pt is currently in the hospital.

## 2023-12-30 NOTE — Telephone Encounter (Signed)
 Pharmacy Patient Advocate Encounter   Received notification from Inpatient Request that prior authorization for FreeStyle Libre 3 Plus Sensor is required/requested.   Insurance verification completed.   The patient is insured through [employer] .   Per test claim: PA required; PA submitted to above mentioned insurance via Latent Key/confirmation #/EOC [id number] Status is pending

## 2023-12-30 NOTE — Plan of Care (Signed)

## 2023-12-30 NOTE — Telephone Encounter (Signed)
 Patient Product/process development scientist completed.    The patient is insured through [employer]. Patient has Medicare and is not eligible for a copay card, but may be able to apply for patient assistance or Medicare RX Payment Plan (Patient Must reach out to their plan, if eligible for payment plan), if available.    Ran test claim for Lantus  Pen and the current 30 day co-pay is $0.00.  Ran test claim for Novolog  FlexPen and the current 30 day co-pay is $0.00.  Ran test claim for Dexcom G7 Sensor and Requires Prior Authorization  Ran test claim for [employer] 3 Plus Sensor and Requires Prior Authorization   This test claim was processed through [employer]- copay amounts may vary at other pharmacies due to [employer], or as the patient moves through the different stages of their insurance plan.     Reyes Sharps, CPHT Pharmacy Technician III Certified Patient Advocate [facility] Patient Advocate Team Direct Number: [phone]  Fax: [phone]

## 2023-12-30 NOTE — Inpatient Diabetes Management (Signed)
 Inpatient Diabetes Program Recommendations  AACE/ADA: New Consensus Statement on Inpatient Glycemic Control (2015)  Target Ranges:  Prepandial:   less than 140 mg/dL      Peak postprandial:   less than 180 mg/dL (1-2 hours)      Critically ill patients:  140 - 180 mg/dL    Latest Reference Range & Units 12/28/23 12:02 12/29/23 18:38 12/30/23 00:07 12/30/23 04:16  Beta-Hydroxybutyric Acid 0.05 - 0.27 mmol/L 4.43 (H) 6.38 (H) 2.84 (H) 2.16 (H)  (H): Data is abnormally high  Latest Reference Range & Units 12/28/23 09:59  Glucose 70 - 99 mg/dL 331 (HH)  (HH): Data is critically high  Latest Reference Range & Units 09/05/23 09:14 12/28/23 19:38  Hemoglobin A1C 4.8 - 5.6 % 6.1 ! 13.1 (H)  329 mg/dl    Latest Reference Range & Units 12/29/23 18:03 12/29/23 20:29 12/29/23 21:28 12/29/23 22:36 12/29/23 23:39 12/30/23 00:59 12/30/23 01:59 12/30/23 02:58 12/30/23 03:55 12/30/23 04:59  Glucose-Capillary 70 - 99 mg/dL 613 (H) 696 (H)  IV Insulin  Drip Started 205 (H) 136 (H) 110 (H) 115 (H) 127 (H) 146 (H) 172 (H) 182 (H)  IV Insulin  Drip Infusing  (H): Data is abnormally high   Admit with: DKA Was admitted 08/16 for DKA Left AMA/Eloped around 6:30 pm that same night  History: DM2, Small cell lung cancer with brain mets status post brain tumor resection/chemo/radiation/immunotherapy   Home DM Meds: Metformin  500 mg BID  Current Orders: IV Insulin  Drip    Per son's report, pt drinks a lot of soda A1c indicates pt will need Insulin  for home    MD- Note Anion Gap down to 12/ CO2 level up to 22 on 4:16am BMET today BHB level still elevated at 2.16  When you allow pt to transition to SQ Insulin , please consider the following:  1. Start Semglee  18 units daily (0.25 units/kg) Continue IV Insulin  Drip for 2 hours after Semglee  given and then can d/c the IV Insulin  Drip  2. Start Novolog  Moderate Correction Scale/ SSI (0-15 units) TID AC + HS    --Will follow patient during  hospitalization--  Adina Rudolpho Arrow RN, MSN, CDCES Diabetes Coordinator Inpatient Glycemic Control Team Team Pager: [phone] (8a-5p)

## 2023-12-31 ENCOUNTER — Ambulatory Visit (HOSPITAL_COMMUNITY): Payer: Self-pay

## 2023-12-31 ENCOUNTER — Other Ambulatory Visit (HOSPITAL_COMMUNITY): Payer: Self-pay

## 2023-12-31 ENCOUNTER — Telehealth: Payer: Self-pay

## 2023-12-31 ENCOUNTER — Inpatient Hospital Stay (HOSPITAL_COMMUNITY)

## 2023-12-31 ENCOUNTER — Telehealth: Payer: Self-pay | Admitting: *Deleted

## 2023-12-31 DIAGNOSIS — E1110 Type 2 diabetes mellitus with ketoacidosis without coma: Secondary | ICD-10-CM | POA: Diagnosis not present

## 2023-12-31 LAB — BASIC METABOLIC PANEL WITH GFR
Anion gap: 14 (ref 5–15)
BUN: 11 mg/dL (ref 8–23)
CO2: 21 mmol/L — ABNORMAL LOW (ref 22–32)
Calcium: 9.4 mg/dL (ref 8.9–10.3)
Chloride: 98 mmol/L (ref 98–111)
Creatinine, Ser: 0.73 mg/dL (ref 0.61–1.24)
GFR, Estimated: >60 mL/min (ref >60)
Glucose, Bld: 319 mg/dL — ABNORMAL HIGH (ref 70–99)
Potassium: 4.4 mmol/L (ref 3.5–5.1)
Sodium: 133 mmol/L — ABNORMAL LOW (ref 135–145)

## 2023-12-31 LAB — GLUCOSE, CAPILLARY
Glucose-Capillary: 260 mg/dL — ABNORMAL HIGH (ref 70–99)
Glucose-Capillary: 302 mg/dL — ABNORMAL HIGH (ref 70–99)
Glucose-Capillary: 217 mg/dL — ABNORMAL HIGH (ref 70–99)
Glucose-Capillary: 139 mg/dL — ABNORMAL HIGH (ref 70–99)
Glucose-Capillary: 247 mg/dL — ABNORMAL HIGH (ref 70–99)

## 2023-12-31 LAB — TSH: TSH: 0.988 u[IU]/mL (ref 0.350–4.500)

## 2023-12-31 LAB — HCV RNA QUANT RFLX ULTRA OR GENOTYP
HCV RNA Qnt(log copy/mL): UNDETERMINED {Log_IU}/mL
HepC Qn: NOT DETECTED [IU]/mL

## 2023-12-31 LAB — VITAMIN B12: Vitamin B-12: 423 pg/mL (ref 180–914)

## 2023-12-31 LAB — AMMONIA: Ammonia: 16 umol/L (ref 9–35)

## 2023-12-31 MED ORDER — INSULIN GLARGINE-YFGN 100 UNIT/ML ~~LOC~~ SOLN
20.0000 [IU] | Freq: Every day | SUBCUTANEOUS | Status: DC
  Administered 2024-01-01: 20 [IU] via SUBCUTANEOUS
  Filled 2023-12-31: qty 0.2

## 2023-12-31 MED ORDER — MELATONIN 5 MG PO TABS
5.0000 mg | ORAL_TABLET | Freq: Every evening | ORAL | Status: DC | PRN
  Filled 2023-12-31: qty 1

## 2023-12-31 MED ORDER — INSULIN GLARGINE 100 UNIT/ML ~~LOC~~ SOLN
2.0000 [IU] | Freq: Once | SUBCUTANEOUS | Status: AC
  Administered 2023-12-31: 2 [IU] via SUBCUTANEOUS
  Filled 2023-12-31: qty 0.02

## 2023-12-31 MED ORDER — OLANZAPINE 10 MG IM SOLR
2.5000 mg | Freq: Once | INTRAMUSCULAR | Status: AC | PRN
Start: 2023-12-31 — End: 2023-12-31
  Administered 2023-12-31: 2.5 mg via INTRAMUSCULAR
  Filled 2023-12-31: qty 10

## 2023-12-31 NOTE — Telephone Encounter (Signed)
 Pharmacy Patient Advocate Encounter  Received notification from [employer] that Prior Authorization for FreeStyle Libre 3 Plus Sensor  has been APPROVED from 12/30/2023 to 05/13/2024. Ran test claim, Copay is $0.00. This test claim was processed through [facility]- copay amounts may vary at other pharmacies due to pharmacy/plan contracts, or as the patient moves through the different stages of their insurance plan.   PA #/Case ID/Reference #: [id number]

## 2023-12-31 NOTE — Telephone Encounter (Signed)
 I contacted pt's son Ashwath Lasch to let him know that I have not received his fmla paperwork, but no answer. I left detailed message for him to call the clinic back.

## 2023-12-31 NOTE — Progress Notes (Signed)
 PROGRESS NOTE  [name]  FMW:[id number] DOB: 1956/01/30 DOA: 12/29/2023 PCP: Elicia Sharper, DO   Brief Narrative: Patient is a 68 year old male with history of diabetes type 2, small cell lung cancer with brain mets status post tumor resection/chemo/radiation/immunotherapy and currently on observation, HFmrEF, CAD, hypertension, hyperlipidemia, hep C, seizure disorder, headaches, insomnia presented to the ED  with abdominal pain, nausea, vomiting and was admitted for management for DKA.  A1c found to be in the range of 13.  Takes metformin  at home.  Started on insulin .  Hospital course also remarkable for confusion.  MRI did not show any acute findings.  Family requesting some time to arrange home environment because patient is alone at home most of the time.  PT consulted.  Assessment & Plan:  Principal Problem:   DKA (diabetic ketoacidosis) (HCC) Active Problems:   Malignant neoplasm metastatic to brain [facility])   Acute metabolic encephalopathy   Chronic congestive heart failure (HCC)   CAD (coronary artery disease)    DKA/poorly controlled diabetes type 2: Report of taking a lot of soda drinks at home.  Takes metformin  at home.  A1c was 6.1 in April , 2025.  Now in the range of 13.  Diabetic coordinator following.  He will need insulin  at discharge.  Monitor blood sugars  Acute metabolic encephalopathy/likely early onset dementia:.  CT head negative for acute intake abnormalities.  Currently alert and oriented.  Family concerned that he is intermittently confused.  MRI did not show any acute intracranial abnormalities, no acute changes on the side of cranial resection.  Showed  chronic lacunar infarcts within the right caudate nucleus and left periatrial white matter,cerebral white matter chronic small vessel ischemic disease, mild generalized cerebral atrophy. Metabolic dementia.  We recommend follow-up with neurology as an outpatient.    His confusion could have been aggravated by  delirium as well.  He is not agitated.  Continue delirium precautions.  Will also check TSH, vitamin B12, RPR.  Metastatic small cell lung cancer: Follows with oncology.Status post brain tumor resection/chemo/radiation/immunotherapy    Chronic HFmrEF: Last echo showed EF of 50 to 55%, grade 1 diastolic dysfunction.  Currently euvolemic  History of coronary artery disease: No anginal symptoms.  Takes aspirin   Hypertension: Currently blood pressure stable.  Takes Imdur , metoprolol  at home.  Currently being continued  Seizure disorder: On Keppra   Hyperlipidemia: On statin at home  Disposition/debility: Patient is ambulatory without assistance.  He is intermittently confused at home.  His son walks most of the time.  Now he is requiring high-dose insulin  at home.  There is no body to take care of him at home.  Family concerned.  Will request physical therapy evaluation.  If unsafe discharge to home, will look options for SNF.  TOC consulted        DVT prophylaxis:enoxaparin  (LOVENOX ) injection 40 mg Start: 12/30/23 1000     Code Status: Full Code  Family Communication: Son at bedside  Patient status: Inpatient  Patient is from :home  Anticipated discharge un:ynfz  Estimated DC date:tomorrow   Consultants: none  Procedures:none  Antimicrobials:  Anti-infectives (From admission, onward)    None       Subjective: Patient seen and examined at bedside today.  Hemodynamically stable.  Alert and oriented to my evaluation.  Not agitated.  Not in any Distress.  Family just concerned that he is intermittently confused.  He was found to be confused this morning by RN  Objective: Vitals:   12/31/23 1142 12/31/23  1200 12/31/23 1257 12/31/23 1356  BP:  92/72  118/72  Pulse: 88 86 99 89  Resp: 14 (!) 21 (!) 22 16  Temp:  97.6 F (36.4 C)  97.6 F (36.4 C)  TempSrc:  Axillary  Oral  SpO2: 96% 95% 92% 100%  Weight:      Height:        Intake/Output Summary (Last 24 hours)  at 12/31/2023 1404 Last data filed at 12/31/2023 0749 Gross per 24 hour  Intake 301.95 ml  Output 400 ml  Net -98.05 ml   Filed Weights   12/29/23 1800 12/29/23 2330  Weight: 73.9 kg 73.5 kg    Examination:  General exam: Overall comfortable, not in distress HEENT: PERRL Respiratory system:  no wheezes or crackles  Cardiovascular system: S1 & S2 heard, RRR.  Chemo-Port on the right chest Gastrointestinal system: Abdomen is nondistended, soft and nontender. Central nervous system: Alert and oriented Extremities: No edema, no clubbing ,no cyanosis Skin: No rashes, no ulcers,no icterus      Data Reviewed: I have personally reviewed following labs and imaging studies  CBC: Recent Labs  Lab 12/28/23 0959 12/28/23 1208 12/28/23 1938 12/29/23 1834 12/29/23 1838  WBC 8.7  --  10.5  --  8.1  NEUTROABS  --   --  7.4  --  5.9  HGB 14.4 15.0 14.0 13.6 12.8*  HCT 41.9 44.0 39.8 40.0 38.9*  MCV 85.7  --  83.8  --  87.0  PLT 447*  --  481*  --  435*   Basic Metabolic Panel: Recent Labs  Lab 12/29/23 1838 12/30/23 0007 12/30/23 0416 12/30/23 0811 12/31/23 0944  NA 132* 136 133* 134* 133*  K 4.3 3.2* 3.4* 3.4* 4.4  CL 95* 100 99 101 98  CO2 17* 21* 22 24 21*  GLUCOSE 379* 93 272* 136* 319*  BUN 19 16 14 12 11   CREATININE 1.01 0.61 0.50* 0.50* 0.73  CALCIUM  9.7 9.4 8.6* 8.9 9.4  MG  --  1.4*  --   --   --      Recent Results (from the past 240 hours)  MRSA Next Gen by PCR, Nasal     Status: None   Collection Time: 12/29/23 11:24 PM   Specimen: Nasal Mucosa; Nasal Swab  Result Value Ref Range Status   MRSA by PCR Next Gen NOT DETECTED NOT DETECTED Final    Comment: (NOTE) The GeneXpert MRSA Assay (FDA approved for NASAL specimens only), is one component of a comprehensive MRSA colonization surveillance program. It is not intended to diagnose MRSA infection nor to guide or monitor treatment for MRSA infections. Test performance is not FDA approved in patients less  than 11 years old. Performed at [facility], [address], [place], [place] [address]      Radiology Studies: MR BRAIN WO CONTRAST Result Date: 12/31/2023 CLINICAL DATA:  Provided history: Delirium. EXAM: MRI HEAD WITHOUT CONTRAST TECHNIQUE: Multiplanar, multiecho pulse sequences of the brain and surrounding structures were obtained without intravenous contrast. COMPARISON:  Head CT 12/28/2023. Prior brain MRI examinations 05/16/2023 and earlier. FINDINGS: Brain: Mild generalized cerebral atrophy. Small resection cavity/biopsy site within the posterior right frontal lobe (with surrounding gliosis). Overlying right parietal cranioplasty. Small chronic lacunar infarct within the right caudate nucleus. Chronic lacunar infarct within the left periatrial white matter. Background multifocal T2 FLAIR hyperintense signal abnormality within the cerebral white matter, nonspecific but compatible with mild chronic small vessel ischemic disease. There is no acute  infarct. No evidence of an intracranial mass. No extra-axial fluid collection. No midline shift. Vascular: Maintained flow voids within the proximal large arterial vessels. Skull and upper cervical spine: Right parietal cranioplasty. No focal worrisome marrow lesion. Sinuses/Orbits: No mass or acute finding within the imaged orbits. No significant paranasal sinus IMPRESSION: 1.  No evidence of an acute intracranial abnormality. 2. Prior right parietal craniotomy. Small underlying resection cavity/biopsy site within the within the posterior right frontal lobe (with surrounding gliosis). This is unchanged in non-contrast appearance as compared to the prior MRI of 05/16/2023. 3. Unchanged chronic lacunar infarcts within the right caudate nucleus and left periatrial white matter. 4. Mild background cerebral white matter chronic small vessel ischemic disease. 5. Mild generalized cerebral atrophy. Electronically Signed   By: Rockey Childs D.O.    On: 12/31/2023 11:30    Scheduled Meds:  aspirin  EC  81 mg Oral Daily   Chlorhexidine  Gluconate Cloth  6 each Topical Daily   enoxaparin  (LOVENOX ) injection  40 mg Subcutaneous Q24H   insulin  aspart  0-15 Units Subcutaneous TID WC   insulin  glargine-yfgn  18 Units Subcutaneous Daily   isosorbide  mononitrate  60 mg Oral Daily   levETIRAcetam   750 mg Oral BID   metoprolol  succinate  12.5 mg Oral QHS   Continuous Infusions:     LOS: 1 day   Ivonne Mustache, MD [facility] P8/19/2025, 2:04 PM

## 2023-12-31 NOTE — Progress Notes (Signed)
 Upon arrival in room pt was standing at bedside found to have removed EKG leads, pulse ox, bp cuff and has deaccessed port while attempting to use the restroom. Verneita, NT attempted to assist pt back to bed. Patient refused. After giving pt some time to talk with his son. Patient then agreed to have leads placed back on and to get back in the bed.

## 2023-12-31 NOTE — Telephone Encounter (Signed)
 Copied from CRM #[id number]. Topic: General - Other >> Dec 30, 2023  2:41 PM Laurier C wrote: Reason for CRM: Patient's son [name] will be sending in FMLA paperwork for his father's PCP to complete on behalf of [name].

## 2023-12-31 NOTE — Telephone Encounter (Signed)
 Call from [employer] from [employer].  Patient had informed them that he had missed several doses of the Epclusa .  Question was does patient need to continue medication or restart.  Informed Asberry that the Epclusa  had been discontinued on 12/28/2023.  Question was why and does it need to be restarted. Will inform Dr. Eben and about plans for medication.  Asberry can be reached at [phone] [facility].

## 2023-12-31 NOTE — Evaluation (Signed)
 Physical Therapy Evaluation Only Patient Details Name: Jared Shea. MRN: [id number] DOB: 21-Sep-1955 Today's Date: 12/31/2023  History of Present Illness  Jared Shea. is a 68 y.o. male admitted with DKA. Hospital course also remarkable for confusion.  MRI did not show any acute findings. PMH: diabetes type 2, small cell lung cancer with brain mets status post tumor resection/chemo/radiation/immunotherapy and currently on observation, HFmrEF, CAD, hypertension, hyperlipidemia, hep C, seizure disorder, headaches, insomnia  Clinical Impression  Pt pleasantly confused, self directed throughout evaluation. Unable to formally MMT due to confusion, AROM WFL,  completes transfers and ambulation independently without AD, no unsteadiness noted, denies pain and no visible SOB or fatigue noted. Per RN, pt has been ambulating in hallway throughout the day ad lib, no AD and no difficulty. Pt amb 120 ft in hallway with PT then returns to recliner, declines further ambulation due to wanting to watch TV. Per chart review, pt amb without assistance at baseline, family concerned there is no 24/7 supv at home. No acute PT needs identified, will sign off at this time.    If plan is discharge home, recommend the following:     Can travel by private vehicle        Equipment Recommendations None recommended by PT  Recommendations for Other Services       Functional Status Assessment Patient has not had a recent decline in their functional status     Precautions / Restrictions Restrictions Weight Bearing Restrictions Per Provider Order: No      Mobility  Bed Mobility               General bed mobility comments: in recliner    Transfers Overall transfer level: Independent                      Ambulation/Gait Ambulation/Gait assistance: Independent Gait Distance (Feet): 120 Feet Assistive device: None Gait Pattern/deviations: WFL(Within Functional Limits)       General Gait  Details: pt self directed with gait, no AD, amb without unsteadiness or near falls, returns to recliner when desired, doesn't appear to be SOB, denies pain  Stairs            Wheelchair Mobility     Tilt Bed    Modified Rankin (Stroke Patients Only)       Balance Overall balance assessment: No apparent balance deficits (not formally assessed)                                           Pertinent Vitals/Pain Pain Assessment Pain Assessment: No/denies pain    Home Living Family/patient expects to be discharged to:: Private residence                   Additional Comments: Pt reports living alone but unable to provide details of PLOF or home setup. Per chart review, pt lives at home alone, has family but not 24/7 supv. Pt not oriented, pleasantly confused.    Prior Function Prior Level of Function : Patient poor historian/Family not available                     Extremity/Trunk Assessment   Upper Extremity Assessment Upper Extremity Assessment: Defer to OT evaluation    Lower Extremity Assessment Lower Extremity Assessment: Overall WFL for tasks assessed    Cervical / Trunk Assessment  Cervical / Trunk Assessment: Normal  Communication   Communication Communication: No apparent difficulties    Cognition Arousal: Alert Behavior During Therapy: WFL for tasks assessed/performed   PT - Cognitive impairments: No family/caregiver present to determine baseline                       PT - Cognition Comments: pt states DOB only, reports [place] as location and when asked if at home states I don't know, are we?, pt not oriented to current date or situation. Pt self directed, pleasantly confused Following commands: Intact       Cueing       General Comments      Exercises     Assessment/Plan    PT Assessment Patient does not need any further PT services  PT Problem List         PT Treatment Interventions       PT Goals (Current goals can be found in the Care Plan section)  Acute Rehab PT Goals Patient Stated Goal: none stated PT Goal Formulation: All assessment and education complete, DC therapy    Frequency       Co-evaluation               AM-PAC PT 6 Clicks Mobility  Outcome Measure Help needed turning from your back to your side while in a flat bed without using bedrails?: None Help needed moving from lying on your back to sitting on the side of a flat bed without using bedrails?: None Help needed moving to and from a bed to a chair (including a wheelchair)?: None Help needed standing up from a chair using your arms (e.g., wheelchair or bedside chair)?: None Help needed to walk in hospital room?: None Help needed climbing 3-5 steps with a railing? : None 6 Click Score: 24    End of Session   Activity Tolerance: Patient tolerated treatment well Patient left: in chair;with call bell/phone within reach;with nursing/sitter in room Nurse Communication: Mobility status      Time: 8384-8375 PT Time Calculation (min) (ACUTE ONLY): 9 min   Charges:   PT Evaluation $PT Eval Low Complexity: 1 Low   PT General Charges $$ ACUTE PT VISIT: 1 Visit         Jared Shea PT, DPT 12/31/23, 4:35 PM

## 2023-12-31 NOTE — Telephone Encounter (Signed)
 I called Asberry, pharmacist with [facility], Informed pt is currently in the hospital and per chart, HCV level was drawn yesterday 8/18, in process. Stated she will call back in about a week if she has not heard back from our office.

## 2023-12-31 NOTE — Progress Notes (Addendum)
       Overnight   NAME: Austine Wiedeman. MRN: [id number] DOB : [birth year]    Date of Service   12/31/2023   HPI/Events of Note    Notified by RN from combative/agitated patient.  Brief history 68 year old male history of diabetes type 2, small cell lung cancer with brain mets status post tumor resection/chemo/radiation/immunotherapy, HFmrEF, CAD, hypertension, hyperlipidemia, hep C, seizure disorder, headaches, insomnia. Admitted through ER with abdominal pain, nausea vomiting, DKA. Hospital course has been remarkable for confusion. MRI did not show new findings. Family requested time to arrange home environment due to patient being home alone most of the time.  Bedside visit For patient with agitation combative in room. Arrived with security/[employer] at which time patient was back in the bed but still clearly anxious and slightly agitated. Spoke with family member who agreed with medication and restraints as needed. Family member has been present during admission states this is the worst episode to date.  Currently 2325 hrs. patient is receiving medication but is more calm.   Interventions/ Plan   Zyprexa  Nonviolent restraints as needed Fall precautions Delirium precautions May require Scheduled Seroquel or like medication going forward         Lynwood Kipper BSN MSNA MSN ACNPC-AG Acute Care Nurse Practitioner [facility]

## 2024-01-01 ENCOUNTER — Other Ambulatory Visit (HOSPITAL_COMMUNITY): Payer: Self-pay

## 2024-01-01 DIAGNOSIS — E1110 Type 2 diabetes mellitus with ketoacidosis without coma: Secondary | ICD-10-CM | POA: Diagnosis not present

## 2024-01-01 LAB — GLUCOSE, CAPILLARY: Glucose-Capillary: 253 mg/dL — ABNORMAL HIGH (ref 70–99)

## 2024-01-01 MED ORDER — OLANZAPINE 10 MG IM SOLR: 2.5000 mg | Freq: Once | INTRAMUSCULAR | Status: DC | PRN

## 2024-01-01 MED ORDER — METFORMIN HCL 500 MG PO TABS
500.0000 mg | ORAL_TABLET | Freq: Two times a day (BID) | ORAL | 0 refills | Status: DC
  Filled 2024-01-01: qty 60, 30d supply, fill #0

## 2024-01-01 MED ORDER — INSULIN ASPART 100 UNIT/ML FLEXPEN
5.0000 [IU] | PEN_INJECTOR | Freq: Three times a day (TID) | SUBCUTANEOUS | 0 refills | Status: DC
  Filled 2024-01-01: qty 15, 30d supply, fill #0

## 2024-01-01 MED ORDER — PEN NEEDLES 31G X 5 MM MISC
1.0000 | Freq: Three times a day (TID) | 0 refills | Status: DC
  Filled 2024-01-01: qty 100, 30d supply, fill #0

## 2024-01-01 MED ORDER — INSULIN GLARGINE 100 UNIT/ML SOLOSTAR PEN
20.0000 [IU] | PEN_INJECTOR | Freq: Every day | SUBCUTANEOUS | 0 refills | Status: DC
  Filled 2024-01-01: qty 15, 75d supply, fill #0

## 2024-01-01 MED ORDER — LANCET DEVICE MISC
1.0000 | Freq: Three times a day (TID) | 0 refills | Status: AC
Start: 2024-01-01 — End: ?
  Filled 2024-01-01: qty 1, fill #0

## 2024-01-01 MED ORDER — BLOOD GLUCOSE MONITOR SYSTEM W/DEVICE KIT
1.0000 | PACK | Freq: Three times a day (TID) | 0 refills | Status: AC
  Filled 2024-01-01: qty 1, 30d supply, fill #0

## 2024-01-01 MED ORDER — BLOOD GLUCOSE TEST VI STRP
1.0000 | ORAL_STRIP | Freq: Three times a day (TID) | 0 refills | Status: DC
  Filled 2024-01-01: qty 100, 30d supply, fill #0

## 2024-01-01 MED ORDER — LANCETS MISC
1.0000 | Freq: Three times a day (TID) | 0 refills | Status: AC
Start: 2024-01-01 — End: ?
  Filled 2024-01-01: qty 100, 30d supply, fill #0

## 2024-01-01 NOTE — TOC Transition Note (Signed)
 Transition of Care [facility]) - Discharge Note   Patient Details  Name: Jared Shea. MRN: [id number] Date of Birth: 1955/06/28  Transition of Care [facility]) CM/SW Contact:  NORMAN ASPEN, LCSW Phone Number: 01/01/2024, 2:36 PM   Clinical Narrative:     Met with pt and son per request for assistance with Medicaid PCS aide forms.  Forms completed and emailed to [employer] for submission.  Son already working with an agency who will follow up with [employer] to check approval and begin aide services.  Family to provide needed assistance until aide services begin.  No further TOC needs.  Final next level of care: Home/Self Care Barriers to Discharge: Barriers Resolved   Patient Goals and CMS Choice Patient states their goals for this hospitalization and ongoing recovery are:: return home          Discharge Placement                       Discharge Plan and Services Additional resources added to the After Visit Summary for                  DME Arranged: N/A DME Agency: NA       HH Arranged: PCS/Personal Care Services          Social Drivers of Health (SDOH) Interventions SDOH Screenings   Food Insecurity: No Food Insecurity (12/29/2023)  Housing: Low Risk  (12/29/2023)  Transportation Needs: No Transportation Needs (12/29/2023)  Utilities: Not At Risk (12/29/2023)  Alcohol Screen: Low Risk  (04/24/2023)  Depression (PHQ2-9): Low Risk  (12/10/2023)  Financial Resource Strain: Medium Risk (04/24/2023)  Physical Activity: Sufficiently Active (04/24/2023)  Social Connections: Unknown (12/29/2023)  Stress: No Stress Concern Present (04/24/2023)  Tobacco Use: Medium Risk (12/29/2023)     Readmission Risk Interventions     No data to display

## 2024-01-01 NOTE — Inpatient Diabetes Management (Signed)
 Inpatient Diabetes Program Recommendations  AACE/ADA: New Consensus Statement on Inpatient Glycemic Control (2015)  Target Ranges:  Prepandial:   less than 140 mg/dL      Peak postprandial:   less than 180 mg/dL (1-2 hours)      Critically ill patients:  140 - 180 mg/dL   Lab Results  Component Value Date   GLUCAP 253 (H) 01/01/2024   HGBA1C 13.1 (H) 12/28/2023    Review of Glycemic Control  Diabetes history: DM2 Outpatient Diabetes medications: metformin  500 BID Current orders for Inpatient glycemic control: Lantus  20 daily, Novolog  0-15 TID  Inpatient Diabetes Program Recommendations:    Discharge Recommendations: Other recommendations: Freestyle Libre Glucose Sensors: [id number]  and [employer] Reader device: [phone] Long acting recommendations: Insulin  Glargine (LANTUS ) Solostar Pen Dose TBD at time of discharge  Supply/Referral recommendations: Glucometer Test strips Lancet device Lancets Pen needles - standard   Use Adult Diabetes Insulin  Treatment Post Discharge order set.  Pt's son instructed on insulin  pen administration. Educated patient  on insulin  pen use at home. Reviewed contents of insulin  flexpen starter kit. Reviewed all steps if insulin  pen including attachment of needle, 2-unit air shot, dialing up dose, giving injection, removing needle, disposal of sharps, storage of unused insulin , disposal of insulin  etc. Patient able to provide successful return demonstration. Also reviewed troubleshooting with insulin  pen. MD to give patient Rxs for insulin  pens and insulin  pen needles. Received verbal order from MD for placing a Freestyle Libre2 on patient.  Educated patient on use, application, monitoring of blood sugar and when to check a finger stick (if receiver alerts to or if she feels different from receiver reading).  Placed sensor on back of right arm.  Continue to monitor cbg's with finger sticks while inpatient. Will use Reader in place of cell phone for blood  sugar readings. Answered all questions.  Thank you. Shona Brandy, RD, LDN, CDCES Inpatient Diabetes Coordinator [phone]

## 2024-01-01 NOTE — Discharge Summary (Incomplete)
 Discharge orders received. AVS discharge instructions given and explained to the son Savva Beamer). Awaiting Meds to Bed to be delivered. The patient will be transported by the son. Education given re: diabetic management. Jared Shea verbalized understanding. Patient left via w/c x1 assist. The patients son Jarryn Altland is waiting for the patient near the lobby.

## 2024-01-01 NOTE — Plan of Care (Signed)
 Problem: Safety: Goal: Non-violent Restraint(s) 01/01/2024 0101 by Joyner-Little, Tashanna Dolin, RN Outcome: Progressing 01/01/2024 0100 by Joyner-Little, Amely Voorheis, RN Outcome: Progressing 01/01/2024 0059 by Nohemi Cornfield, RN Outcome: Progressing   Problem: Safety: Goal: Non-violent Restraint(s) Outcome: Progressing   01/01/2024 0059 by Joyner-Little, Tulsi Crossett, RN Outcome: Progressing Goal: Diagnostic test results will improve 01/01/2024 0100 by Joyner-Little, Mithcell Schumpert, RN Outcome: Progressing 01/01/2024 0059 by Nohemi Cornfield, RN Outcome: Progressing Goal: Respiratory complications will improve 01/01/2024 0100 by Nohemi Cornfield, RN Outcome: Progressing 01/01/2024 0059 by Joyner-Little, Erie Sica, RN Outcome: Progressing Goal: Cardiovascular complication will be avoided 01/01/2024 0100 by Nohemi Cornfield, RN Outcome: Progressing 01/01/2024 0059 by Nohemi Cornfield, RN Outcome: Progressing   Problem: Activity: Goal: Risk for activity intolerance will decrease 01/01/2024 0100 by Joyner-Little, Zionna Homewood, RN Outcome: Progressing 01/01/2024 0059 by Nohemi Cornfield, RN Outcome: Progressing   Problem: Nutrition: Goal: Adequate nutrition will be maintained 01/01/2024 0100 by Nohemi Cornfield, RN Outcome: Progressing 01/01/2024 0059 by Nohemi Cornfield, RN Outcome: Progressing   Problem: Coping: Goal: Level of anxiety will decrease 01/01/2024 0100 by Joyner-Little, Antario Yasuda, RN Outcome: Progressing 01/01/2024 0059 by Nohemi Cornfield, RN Outcome: Progressing   Problem: Elimination: Goal: Will not experience complications related to bowel motility 01/01/2024 0100 by Nohemi Cornfield, RN Outcome: Progressing 01/01/2024 0059 by Nohemi Cornfield, RN Outcome: Progressing Goal: Will not experience complications related to urinary retention 01/01/2024 0100 by Nohemi Cornfield, RN Outcome: Progressing 01/01/2024 0059 by  Nohemi Cornfield, RN Outcome: Progressing   Problem: Pain Managment: Goal: General experience of comfort will improve and/or be controlled 01/01/2024 0100 by Nohemi Cornfield, RN Outcome: Progressing 01/01/2024 0059 by Nohemi Cornfield, RN Outcome: Progressing   Problem: Safety: Goal: Ability to remain free from injury will improve 01/01/2024 0100 by Nohemi Cornfield, RN Outcome: Progressing 01/01/2024 0059 by Nohemi Cornfield, RN Outcome: Progressing   Problem: Skin Integrity: Goal: Risk for impaired skin integrity will decrease 01/01/2024 0100 by Nohemi Cornfield, RN Outcome: Progressing 01/01/2024 0059 by Nohemi Cornfield, RN Outcome: Progressing   Problem: Education: Goal: Ability to describe self-care measures that may prevent or decrease complications (Diabetes Survival Skills Education) will improve 01/01/2024 0100 by Joyner-Little, Darica Goren, RN Outcome: Progressing 01/01/2024 0059 by Nohemi Cornfield, RN Outcome: Progressing Goal: Individualized Educational Video(s) 01/01/2024 0100 by Joyner-Little, Robbie Nangle, RN Outcome: Progressing 01/01/2024 0059 by Nohemi Cornfield, RN Outcome: Progressing   Problem: Coping: Goal: Ability to adjust to condition or change in health will improve 01/01/2024 0100 by Nohemi Cornfield, RN Outcome: Progressing 01/01/2024 0059 by Nohemi Cornfield, RN Outcome: Progressing   Problem: Health Behavior/Discharge Planning: Goal: Ability to identify and utilize available resources and services will improve 01/01/2024 0100 by Nohemi Cornfield, RN Outcome: Progressing 01/01/2024 0059 by Nohemi Cornfield, RN Outcome: Progressing Goal: Ability to manage health-related needs will improve 01/01/2024 0100 by Joyner-Little, Jarrell Armond, RN Outcome: Progressing 01/01/2024 0059 by Nohemi Cornfield, RN Outcome: Progressing   Problem: Metabolic: Goal: Ability to maintain appropriate  glucose levels will improve 01/01/2024 0100 by Nohemi Cornfield, RN Outcome: Progressing 01/01/2024 0059 by Nohemi Cornfield, RN Outcome: Progressing   Problem: Nutritional: Goal: Maintenance of adequate nutrition will improve 01/01/2024 0100 by Joyner-Little, Namari Breton, RN Outcome: Progressing 01/01/2024 0059 by Nohemi Cornfield, RN Outcome: Progressing Goal: Progress toward achieving an optimal weight will improve 01/01/2024 0100 by Joyner-Little, Dewan Emond, RN Outcome: Progressing 01/01/2024 0059 by Nohemi Cornfield, RN Outcome: Progressing   Problem: Skin Integrity: Goal: Risk for impaired skin integrity will decrease 01/01/2024 0100 by Nohemi Cornfield, RN Outcome: Progressing 01/01/2024 0059 by Nohemi Cornfield, RN Outcome: Progressing   Problem: Tissue Perfusion:  Goal: Adequacy of tissue perfusion will improve 01/01/2024 0100 by Joyner-Little, Jhony Antrim, RN Outcome: Progressing 01/01/2024 0059 by Joyner-Little, Davionne Mastrangelo, RN Outcome: Progressing

## 2024-01-01 NOTE — Discharge Summary (Signed)
 Physician Discharge Summary  Jared Shea. FMW:[id number] DOB: 1956/03/01 DOA: 12/29/2023  PCP: Elicia Sharper, DO  Admit date: 12/29/2023 Discharge date: 01/01/2024  Admitted From: Home Disposition:  Home  Discharge Condition:Stable CODE STATUS:FULL Diet recommendation: Carb Modified   Brief/Interim Summary: Patient is a 68 year old male with history of diabetes type 2, small cell lung cancer with brain mets status post tumor resection/chemo/radiation/immunotherapy and currently on observation, HFmrEF, CAD, hypertension, hyperlipidemia, hep C, seizure disorder, headaches, insomnia presented to the ED  with abdominal pain, nausea, vomiting and was admitted for management for DKA.  A1c found to be in the range of 13.  Takes metformin  at home.  Started on insulin .  Hospital course also remarkable for confusion.  Likely delirium.  MRI did not show any acute findings.  We recommend to follow-up with neurology as an outpatient.  Medically stable for discharge home today.  Following problems were addressed during the hospitalization:  DKA/poorly controlled diabetes type 2: Report of taking a lot of soda drinks at home.  Takes metformin  at home.  A1c was 6.1 in April , 2025.  Now in the range of 13.  Diabetic coordinator following.  Started on insulin    Acute metabolic encephalopathy/likely early onset dementia:.  CT head negative for acute intake abnormalities.  Currently alert and oriented.  Family concerned that he is intermittently confused.  MRI did not show any acute intracranial abnormalities, no acute changes on the side of cranial resection.  Showed  chronic lacunar infarcts within the right caudate nucleus and left periatrial white matter,cerebral white matter chronic small vessel ischemic disease, mild generalized cerebral atrophy. Likely early onset dementia.  Could be delirium as well.  We recommend follow-up with neurology as an outpatient.     He is not agitated.  Normal TSH, vitamin  B12.  Ambulatory referral provided for neurology follow-up   Metastatic small cell lung cancer: Follows with oncology.Status post brain tumor resection/chemo/radiation/immunotherapy     Chronic HFmrEF: Last echo showed EF of 50 to 55%, grade 1 diastolic dysfunction.  Currently euvolemic   History of coronary artery disease: No anginal symptoms.  Takes aspirin    Hypertension:   Takes Imdur , metoprolol  at home.  Currently being continued   Seizure disorder: On Keppra    Hyperlipidemia: On statin at home   Disposition/debility: Patient is ambulatory without assistance. TOC consulted   Discharge Diagnoses:  Principal Problem:   DKA (diabetic ketoacidosis) (HCC) Active Problems:   Malignant neoplasm metastatic to brain [facility])   Acute metabolic encephalopathy   Chronic congestive heart failure (HCC)   CAD (coronary artery disease)    Discharge Instructions  Discharge Instructions     Ambulatory referral to Neurology   Complete by: As directed    An appointment is requested in approximately: 3 weeks   Diet Carb Modified   Complete by: As directed    Discharge instructions   Complete by: As directed    1)Please take your medications as instructed 2)Follow up with your PCP in a week 3)Monitor your blood sugars very well at home.  Take your insulin  as instructed.  4)Follow up with neurology as an outpatient 2 to 3 weeks.  Name and number of the provider group has been attached.  You can call for appointment.  We also provided  a referral   Increase activity slowly   Complete by: As directed       Allergies as of 01/01/2024       Reactions   Jardiance  [empagliflozin ]  Caused diarrhea & itching, so we stopped   Penicillins Rash   Did it involve swelling of the face/tongue/throat, SOB, or low BP? N Did it involve sudden or severe rash/hives, skin peeling, or any reaction on the inside of your mouth or nose? Y Did you need to seek medical attention at a hospital or doctor's  office? Y When did it last happen?    childhood   If all above answers are "NO", may proceed with cephalosporin use.        Medication List     TAKE these medications    aspirin  EC 81 MG tablet Take 1 tablet (81 mg total) by mouth daily. Swallow whole.   atorvastatin  20 MG tablet Commonly known as: Lipitor Take 1 tablet (20 mg total) by mouth daily.   Blood Glucose Monitoring Suppl Devi 1 each by Does not apply route 3 (three) times daily. May dispense any manufacturer covered by patient's insurance.   BLOOD GLUCOSE TEST STRIPS Strp 1 each by Does not apply route 3 (three) times daily. Use as directed to check blood sugar. May dispense any manufacturer covered by patient's insurance and fits patient's device.   insulin  aspart 100 UNIT/ML FlexPen Commonly known as: NOVOLOG  Inject 5 Units into the skin 3 (three) times daily with meals. Only take if eating a meal AND Blood Glucose (BG) is 80 or higher.   insulin  glargine 100 UNIT/ML Solostar Pen Commonly known as: LANTUS  Inject 20 Units into the skin daily. May substitute as needed per insurance.   isosorbide  mononitrate 60 MG 24 hr tablet Commonly known as: IMDUR  Take 60 mg by mouth daily.   Lancet Device Misc 1 each by Does not apply route 3 (three) times daily. May dispense any manufacturer covered by patient's insurance.   Lancets Misc 1 each by Does not apply route 3 (three) times daily. Use as directed to check blood sugar. May dispense any manufacturer covered by patient's insurance and fits patient's device.   levETIRAcetam  750 MG tablet Commonly known as: KEPPRA  TAKE 1 TABLET(750 MG) BY MOUTH TWICE DAILY   losartan -hydrochlorothiazide  50-12.5 MG tablet Commonly known as: Hyzaar Take 1 tablet by mouth daily.   melatonin 5 MG Tabs Take 1 tablet (5 mg total) by mouth at bedtime as needed (for sleep).   metFORMIN  500 MG tablet Commonly known as: GLUCOPHAGE  Take 1 tablet (500 mg total) by mouth 2 (two) times  daily with a meal. What changed: See the new instructions.   metoprolol  succinate 25 MG 24 hr tablet Commonly known as: Toprol  XL Take 0.5 tablets (12.5 mg total) by mouth at bedtime.   Pen Needles 31G X 5 MM Misc 1 each by Does not apply route 3 (three) times daily. May dispense any manufacturer covered by patient's insurance.        Follow-up Information     Elicia Sharper, DO. Schedule an appointment as soon as possible for a visit in 1 week(s).   Specialty: Internal Medicine Contact information: [address] [place], [address] [place] [address] [phone]         [facility]. Schedule an appointment as soon as possible for a visit in 1 week(s).   Specialty: Neurology Contact information: [address] [place]  [address] [phone]               Allergies  Allergen Reactions   Jardiance  [Empagliflozin ]     Caused diarrhea & itching, so we stopped  Penicillins Rash    Did it involve swelling of the face/tongue/throat, SOB, or low BP? N Did it involve sudden or severe rash/hives, skin peeling, or any reaction on the inside of your mouth or nose? Y Did you need to seek medical attention at a hospital or doctor's office? Y When did it last happen?    childhood   If all above answers are "NO", may proceed with cephalosporin use.     Consultations: None   Procedures/Studies: MR BRAIN WO CONTRAST Result Date: 12/31/2023 CLINICAL DATA:  Provided history: Delirium. EXAM: MRI HEAD WITHOUT CONTRAST TECHNIQUE: Multiplanar, multiecho pulse sequences of the brain and surrounding structures were obtained without intravenous contrast. COMPARISON:  Head CT 12/28/2023. Prior brain MRI examinations 05/16/2023 and earlier. FINDINGS: Brain: Mild generalized cerebral atrophy. Small resection cavity/biopsy site within the posterior right frontal lobe (with surrounding gliosis). Overlying right parietal cranioplasty.  Small chronic lacunar infarct within the right caudate nucleus. Chronic lacunar infarct within the left periatrial white matter. Background multifocal T2 FLAIR hyperintense signal abnormality within the cerebral white matter, nonspecific but compatible with mild chronic small vessel ischemic disease. There is no acute infarct. No evidence of an intracranial mass. No extra-axial fluid collection. No midline shift. Vascular: Maintained flow voids within the proximal large arterial vessels. Skull and upper cervical spine: Right parietal cranioplasty. No focal worrisome marrow lesion. Sinuses/Orbits: No mass or acute finding within the imaged orbits. No significant paranasal sinus IMPRESSION: 1.  No evidence of an acute intracranial abnormality. 2. Prior right parietal craniotomy. Small underlying resection cavity/biopsy site within the within the posterior right frontal lobe (with surrounding gliosis). This is unchanged in non-contrast appearance as compared to the prior MRI of 05/16/2023. 3. Unchanged chronic lacunar infarcts within the right caudate nucleus and left periatrial white matter. 4. Mild background cerebral white matter chronic small vessel ischemic disease. 5. Mild generalized cerebral atrophy. Electronically Signed   By: Rockey Childs D.O.   On: 12/31/2023 11:30   CT Head Wo Contrast Result Date: 12/28/2023 CLINICAL DATA:  Mental status change EXAM: CT HEAD WITHOUT CONTRAST TECHNIQUE: Contiguous axial images were obtained from the base of the skull through the vertex without intravenous contrast. RADIATION DOSE REDUCTION: This exam was performed according to the departmental dose-optimization program which includes automated exposure control, adjustment of the mA and/or kV according to patient size and/or use of iterative reconstruction technique. COMPARISON:  CT brain 12/28/2023, MRI 05/16/2023 FINDINGS: Brain: No acute territorial infarction, hemorrhage or intracranial mass. Stable ventricle size.  Mild chronic small vessel ischemic changes of the white matter. Stable small focus of encephalomalacia at the right frontal lobe. Stable small focus of encephalomalacia within the left posterior white matter abutting the ventricle. The ventricles are nonenlarged Vascular: No hyperdense vessels.  Carotid vascular calcification Skull: Right anterior craniotomy.  No fracture Sinuses/Orbits: No acute finding. Other: None IMPRESSION: 1. No CT evidence for acute intracranial abnormality. 2. Mild chronic small vessel ischemic changes of the white matter. Right anterior craniotomy Electronically Signed   By: Luke Bun M.D.   On: 12/28/2023 21:00   CT ABDOMEN PELVIS W CONTRAST Result Date: 12/28/2023 CLINICAL DATA:  Chest and abdominal pain. History of non-small cell cancer. EXAM: CT ANGIOGRAPHY CHEST CT ABDOMEN AND PELVIS WITH CONTRAST TECHNIQUE: Multidetector CT imaging of the chest was performed using the standard protocol during bolus administration of intravenous contrast. Multiplanar CT image reconstructions and MIPs were obtained to evaluate the vascular anatomy. Multidetector CT imaging of the abdomen and pelvis was  performed using the standard protocol during bolus administration of intravenous contrast. RADIATION DOSE REDUCTION: This exam was performed according to the departmental dose-optimization program which includes automated exposure control, adjustment of the mA and/or kV according to patient size and/or use of iterative reconstruction technique. CONTRAST:  75mL OMNIPAQUE  IOHEXOL  350 MG/ML SOLN COMPARISON:  CT scan 09/10/2023 FINDINGS: CTA CHEST FINDINGS Cardiovascular: The heart is normal in size. No pericardial effusion. The aorta is normal in caliber. No dissection or atherosclerotic calcification. Scattered three-vessel coronary artery calcifications are noted. The pulmonary arterial tree is well opacified. No filling defects to suggest pulmonary embolism. Mediastinum/Nodes: Scattered mediastinal  and hilar lymph nodes are stable. No mass or overt adenopathy. No new or progressive changes. The esophagus is grossly normal. Lungs/Pleura: Stable underlying emphysematous changes and areas of pulmonary scarring. No infiltrates, edema or effusions. No worrisome pulmonary lesions or pulmonary nodules. Musculoskeletal: No supraclavicular or axillary adenopathy. The right IJ Port-A-Cath is stable. No worrisome lytic sclerotic bone lesions. Review of the MIP images confirms the above findings. CT ABDOMEN and PELVIS FINDINGS Hepatobiliary: Stable fatty change near the falciform ligament. No worrisome hepatic lesions or intrahepatic biliary dilatation. The gallbladder is normal. No common bile duct dilatation. Pancreas: Progressive diffuse pancreatic atrophy but no mass, inflammation or ductal dilatation. Spleen: Normal in size without focal abnormality. Adrenals/Urinary Tract: The adrenal glands are normal. No renal lesions or hydronephrosis. The bladder is unremarkable. Stomach/Bowel: The stomach, duodenum, small bowel and colon are grossly normal without oral contrast. No inflammatory changes, mass lesions or obstructive findings. The appendix is normal. Stable colonic diverticulosis. Vascular/Lymphatic: The aorta is normal in caliber. No dissection. Stable age advanced atherosclerotic calcifications. The branch vessels are patent. The major venous structures are patent. No mesenteric or retroperitoneal mass or adenopathy. Small scattered lymph nodes are noted. Reproductive: Prostate is unremarkable. Other: No pelvic mass or adenopathy. No free pelvic fluid collections. No inguinal mass or adenopathy. No abdominal wall hernia or subcutaneous lesions. Musculoskeletal: No significant bony findings. Review of the MIP images confirms the above findings. IMPRESSION: 1. No CT findings for pulmonary embolism. 2. Normal caliber thoracic aorta.  No dissection. 3. Stable emphysematous changes and areas of pulmonary scarring. 4.  No acute abdominal/pelvic findings, mass lesions or adenopathy. 5. Progressive diffuse pancreatic atrophy but no mass, inflammation or ductal dilatation. 6. Stable age advanced atherosclerotic calcifications involving the aorta and branch vessels including the coronary arteries. Aortic Atherosclerosis (ICD10-I70.0) and Emphysema (ICD10-J43.9). Electronically Signed   By: MYRTIS Stammer M.D.   On: 12/28/2023 14:33   CT Angio Chest PE W and/or Wo Contrast Result Date: 12/28/2023 CLINICAL DATA:  Chest and abdominal pain. History of non-small cell cancer. EXAM: CT ANGIOGRAPHY CHEST CT ABDOMEN AND PELVIS WITH CONTRAST TECHNIQUE: Multidetector CT imaging of the chest was performed using the standard protocol during bolus administration of intravenous contrast. Multiplanar CT image reconstructions and MIPs were obtained to evaluate the vascular anatomy. Multidetector CT imaging of the abdomen and pelvis was performed using the standard protocol during bolus administration of intravenous contrast. RADIATION DOSE REDUCTION: This exam was performed according to the departmental dose-optimization program which includes automated exposure control, adjustment of the mA and/or kV according to patient size and/or use of iterative reconstruction technique. CONTRAST:  75mL OMNIPAQUE  IOHEXOL  350 MG/ML SOLN COMPARISON:  CT scan 09/10/2023 FINDINGS: CTA CHEST FINDINGS Cardiovascular: The heart is normal in size. No pericardial effusion. The aorta is normal in caliber. No dissection or atherosclerotic calcification. Scattered three-vessel coronary artery calcifications are noted.  The pulmonary arterial tree is well opacified. No filling defects to suggest pulmonary embolism. Mediastinum/Nodes: Scattered mediastinal and hilar lymph nodes are stable. No mass or overt adenopathy. No new or progressive changes. The esophagus is grossly normal. Lungs/Pleura: Stable underlying emphysematous changes and areas of pulmonary scarring. No  infiltrates, edema or effusions. No worrisome pulmonary lesions or pulmonary nodules. Musculoskeletal: No supraclavicular or axillary adenopathy. The right IJ Port-A-Cath is stable. No worrisome lytic sclerotic bone lesions. Review of the MIP images confirms the above findings. CT ABDOMEN and PELVIS FINDINGS Hepatobiliary: Stable fatty change near the falciform ligament. No worrisome hepatic lesions or intrahepatic biliary dilatation. The gallbladder is normal. No common bile duct dilatation. Pancreas: Progressive diffuse pancreatic atrophy but no mass, inflammation or ductal dilatation. Spleen: Normal in size without focal abnormality. Adrenals/Urinary Tract: The adrenal glands are normal. No renal lesions or hydronephrosis. The bladder is unremarkable. Stomach/Bowel: The stomach, duodenum, small bowel and colon are grossly normal without oral contrast. No inflammatory changes, mass lesions or obstructive findings. The appendix is normal. Stable colonic diverticulosis. Vascular/Lymphatic: The aorta is normal in caliber. No dissection. Stable age advanced atherosclerotic calcifications. The branch vessels are patent. The major venous structures are patent. No mesenteric or retroperitoneal mass or adenopathy. Small scattered lymph nodes are noted. Reproductive: Prostate is unremarkable. Other: No pelvic mass or adenopathy. No free pelvic fluid collections. No inguinal mass or adenopathy. No abdominal wall hernia or subcutaneous lesions. Musculoskeletal: No significant bony findings. Review of the MIP images confirms the above findings. IMPRESSION: 1. No CT findings for pulmonary embolism. 2. Normal caliber thoracic aorta.  No dissection. 3. Stable emphysematous changes and areas of pulmonary scarring. 4. No acute abdominal/pelvic findings, mass lesions or adenopathy. 5. Progressive diffuse pancreatic atrophy but no mass, inflammation or ductal dilatation. 6. Stable age advanced atherosclerotic calcifications  involving the aorta and branch vessels including the coronary arteries. Aortic Atherosclerosis (ICD10-I70.0) and Emphysema (ICD10-J43.9). Electronically Signed   By: MYRTIS Stammer M.D.   On: 12/28/2023 14:33   CT Head Wo Contrast Result Date: 12/28/2023 CLINICAL DATA:  Headache, increasing frequency or severity hx cancer EXAM: CT HEAD WITHOUT CONTRAST TECHNIQUE: Contiguous axial images were obtained from the base of the skull through the vertex without intravenous contrast. RADIATION DOSE REDUCTION: This exam was performed according to the departmental dose-optimization program which includes automated exposure control, adjustment of the mA and/or kV according to patient size and/or use of iterative reconstruction technique. COMPARISON:  MRI head 03/20/2019 FINDINGS: Brain: Patchy and confluent areas of decreased attenuation are noted throughout the deep and periventricular white matter of the cerebral hemispheres bilaterally, compatible with chronic microvascular ischemic disease. No evidence of large-territorial acute infarction. No parenchymal hemorrhage. No mass lesion. No extra-axial collection. No mass effect or midline shift. No hydrocephalus. Basilar cisterns are patent. Vascular: No hyperdense vessel. Atherosclerotic calcifications are present within the cavernous internal carotid arteries. Skull: No acute fracture or focal lesion.  Prior right burr holes. Sinuses/Orbits: Paranasal sinuses and mastoid air cells are clear. The orbits are unremarkable. Other: None. IMPRESSION: No acute intracranial abnormality. Electronically Signed   By: Morgane  Naveau M.D.   On: 12/28/2023 14:20      Subjective: Patient seen and examined at bedside today.  Hemodynamically stable.  Report of being agitated at night.  But during evaluation, he was not agitated, calm and comfortable and mostly oriented.  Denies any new complaints.  Medically stable for discharge.  Long discussion held with the son at bedside about  discharge  planning.  Discharge Exam: Vitals:   01/01/24 0210 01/01/24 0509  BP: (!) 171/84 (!) 180/83  Pulse: 82 89  Resp: 18 16  Temp: 98 F (36.7 C)   SpO2:  100%   Vitals:   12/31/23 1821 12/31/23 2142 01/01/24 0210 01/01/24 0509  BP: 134/68 (!) 141/76 (!) 171/84 (!) 180/83  Pulse: 85 82 82 89  Resp: 16 18 18 16   Temp: (!) 97.5 F (36.4 C) 98.1 F (36.7 C) 98 F (36.7 C)   TempSrc:   Oral   SpO2: 100% 100%  100%  Weight:      Height:        General: Pt is alert, awake, not in acute distress Cardiovascular: RRR, S1/S2 +, no rubs, no gallops Respiratory: CTA bilaterally, no wheezing, no rhonchi Abdominal: Soft, NT, ND, bowel sounds + Extremities: no edema, no cyanosis    The results of significant diagnostics from this hospitalization (including imaging, microbiology, ancillary and laboratory) are listed below for reference.     Microbiology: Recent Results (from the past 240 hours)  MRSA Next Gen by PCR, Nasal     Status: None   Collection Time: 12/29/23 11:24 PM   Specimen: Nasal Mucosa; Nasal Swab  Result Value Ref Range Status   MRSA by PCR Next Gen NOT DETECTED NOT DETECTED Final    Comment: (NOTE) The GeneXpert MRSA Assay (FDA approved for NASAL specimens only), is one component of a comprehensive MRSA colonization surveillance program. It is not intended to diagnose MRSA infection nor to guide or monitor treatment for MRSA infections. Test performance is not FDA approved in patients less than 34 years old. Performed at [facility], [address] [place], [place] [address]      Labs: BNP (last 3 results) No results for input(s): BNP in the last 8760 hours. Basic Metabolic Panel: Recent Labs  Lab 12/29/23 1838 12/30/23 0007 12/30/23 0416 12/30/23 0811 12/31/23 0944  NA 132* 136 133* 134* 133*  K 4.3 3.2* 3.4* 3.4* 4.4  CL 95* 100 99 101 98  CO2 17* 21* 22 24 21*  GLUCOSE 379* 93 272* 136* 319*  BUN 19 16 14 12 11    CREATININE 1.01 0.61 0.50* 0.50* 0.73  CALCIUM  9.7 9.4 8.6* 8.9 9.4  MG  --  1.4*  --   --   --    Liver Function Tests: Recent Labs  Lab 12/28/23 0959 12/28/23 1938 12/29/23 1838  AST 15 16 14*  ALT 13 12 11   ALKPHOS 78 74 65  BILITOT 1.1 0.9 1.0  PROT 8.3* 8.1 8.0  ALBUMIN 4.4 4.3 4.1   Recent Labs  Lab 12/28/23 0959 12/28/23 1938  LIPASE 24 23   Recent Labs  Lab 12/31/23 0946  AMMONIA 16   CBC: Recent Labs  Lab 12/28/23 0959 12/28/23 1208 12/28/23 1938 12/29/23 1834 12/29/23 1838  WBC 8.7  --  10.5  --  8.1  NEUTROABS  --   --  7.4  --  5.9  HGB 14.4 15.0 14.0 13.6 12.8*  HCT 41.9 44.0 39.8 40.0 38.9*  MCV 85.7  --  83.8  --  87.0  PLT 447*  --  481*  --  435*   Cardiac Enzymes: No results for input(s): CKTOTAL, CKMB, CKMBINDEX, TROPONINI in the last 168 hours. BNP: Invalid input(s): POCBNP CBG: Recent Labs  Lab 12/31/23 0919 12/31/23 1203 12/31/23 1622 12/31/23 2145 01/01/24 0823  GLUCAP 302* 217* 139* 247* 253*   D-Dimer No results  for input(s): DDIMER in the last 72 hours. Hgb A1c No results for input(s): HGBA1C in the last 72 hours. Lipid Profile No results for input(s): CHOL, HDL, LDLCALC, TRIG, CHOLHDL, LDLDIRECT in the last 72 hours. Thyroid  function studies Recent Labs    12/31/23 1752  TSH 0.988   Anemia work up Recent Labs    12/31/23 1752  VITAMINB12 423   Urinalysis    Component Value Date/Time   COLORURINE YELLOW 12/29/2023 2010   APPEARANCEUR CLEAR 12/29/2023 2010   APPEARANCEUR Clear 07/30/2019 1330   LABSPEC 1.024 12/29/2023 2010   PHURINE 5.0 12/29/2023 2010   GLUCOSEU >=500 (A) 12/29/2023 2010   HGBUR NEGATIVE 12/29/2023 2010   BILIRUBINUR NEGATIVE 12/29/2023 2010   BILIRUBINUR Negative 04/03/2021 1503   BILIRUBINUR Negative 07/30/2019 1330   KETONESUR 20 (A) 12/29/2023 2010   PROTEINUR NEGATIVE 12/29/2023 2010   UROBILINOGEN 1.0 04/03/2021 1503   NITRITE NEGATIVE 12/29/2023  2010   LEUKOCYTESUR NEGATIVE 12/29/2023 2010   Sepsis Labs Recent Labs  Lab 12/28/23 0959 12/28/23 1938 12/29/23 1838  WBC 8.7 10.5 8.1   Microbiology Recent Results (from the past 240 hours)  MRSA Next Gen by PCR, Nasal     Status: None   Collection Time: 12/29/23 11:24 PM   Specimen: Nasal Mucosa; Nasal Swab  Result Value Ref Range Status   MRSA by PCR Next Gen NOT DETECTED NOT DETECTED Final    Comment: (NOTE) The GeneXpert MRSA Assay (FDA approved for NASAL specimens only), is one component of a comprehensive MRSA colonization surveillance program. It is not intended to diagnose MRSA infection nor to guide or monitor treatment for MRSA infections. Test performance is not FDA approved in patients less than 32 years old. Performed at [facility], [address], [place], [place] [address]     Please note: You were cared for by a hospitalist during your hospital stay. Once you are discharged, your primary care physician will handle any further medical issues. Please note that NO REFILLS for any discharge medications will be authorized once you are discharged, as it is imperative that you return to your primary care physician (or establish a relationship with a primary care physician if you do not have one) for your post hospital discharge needs so that they can reassess your need for medications and monitor your lab values.    Time coordinating discharge: 40 minutes  SIGNED:   Ivonne Mustache, MD  [facility] 01/01/2024, 10:35 AM Pager [phone]  If 7PM-7AM, please contact night-coverage [email or web address] Password [email or web address]

## 2024-01-01 NOTE — Progress Notes (Signed)
 Discharge medication delivered to patient and family in room. Family has meds and patient is discharged  via wheelchair to front entrance [employer]

## 2024-01-01 NOTE — Discharge Summary (Incomplete)
  Discharge orders received. AVS discharge instructions given and explained to the son [name]). Awaiting Meds to Bed to be delivered. The patient will be transported by the son. Education given re: diabetic management. Jared Shea verbalized understanding. Patient left via w/c x1 assist. The patients son [name] is waiting for the patient near the lobby.

## 2024-01-01 NOTE — Progress Notes (Signed)
 Patient wandered out into another room, was combative, jumping on the couch and attempted to hit and agitated. Security came to beside, NP notified and came to assess the patient. Bilateral soft wrist restraints ordered and applied.

## 2024-01-02 ENCOUNTER — Telehealth: Payer: Self-pay

## 2024-01-02 NOTE — Telephone Encounter (Signed)
 [place], pharmacist at [facility], informed  His HCV is (-) He needs clinic f/u with me Would not restart his Hep C medication at this time.  I will ask front office to schedule an appt w/Dr Eben.

## 2024-01-02 NOTE — Transitions of Care (Post Inpatient/ED Visit) (Signed)
   01/02/2024  Name: Jared Shea. MRN: [id number] DOB: Jun 23, 1955  Today's TOC FU Call Status: Today's TOC FU Call Status:: Unsuccessful Call (1st Attempt) Unsuccessful Call (1st Attempt) Date: 01/02/24  Attempted to reach the patient regarding the most recent Inpatient/ED visit.  Follow Up Plan: Additional outreach attempts will be made to reach the patient to complete the Transitions of Care (Post Inpatient/ED visit) call.   Shona Prow RN, CCM [name]  VBCI-Population Health RN Care Manager [phone]

## 2024-01-03 ENCOUNTER — Telehealth: Payer: Self-pay

## 2024-01-03 NOTE — Transitions of Care (Post Inpatient/ED Visit) (Signed)
   01/03/2024  Name: Jared Shea. MRN: [id number] DOB: 12-06-1955  Today's TOC FU Call Status: Today's TOC FU Call Status:: Unsuccessful Call (2nd Attempt) Unsuccessful Call (1st Attempt) Date: 01/02/24 Unsuccessful Call (2nd Attempt) Date: 01/03/24  Attempted to reach the patient regarding the most recent Inpatient/ED visit.  Follow Up Plan: Additional outreach attempts will be made to reach the patient to complete the Transitions of Care (Post Inpatient/ED visit) call.   Medford Balboa, BSN, RN [place]  VBCI - [employer] [facility] RN Care Manager [phone]

## 2024-01-06 ENCOUNTER — Telehealth: Payer: Self-pay

## 2024-01-06 ENCOUNTER — Telehealth: Payer: Self-pay | Admitting: *Deleted

## 2024-01-06 ENCOUNTER — Encounter: Payer: Self-pay | Admitting: Internal Medicine

## 2024-01-06 NOTE — Transitions of Care (Post Inpatient/ED Visit) (Signed)
   01/06/2024  Name: Jared Shea. MRN: [id number] DOB: July 25, 1955  Today's TOC FU Call Status: Today's TOC FU Call Status:: Successful TOC FU Call Completed TOC FU Call Complete Date: 01/06/24 [facility] RN spoke with patient who states he plans to change PCPs and declined TOC call) Patient's Name and Date of Birth confirmed.    Shona Prow RN, CCM [place]  VBCI-Population Health RN Care Manager [phone]

## 2024-01-06 NOTE — Telephone Encounter (Signed)
 Left VM on sister's phone to call clinic back at [phone] Will try again as well.

## 2024-01-06 NOTE — Telephone Encounter (Signed)
 Patient was identified as falling into the [place] Measure - Diabetes.   Patient was: Appointment already scheduled for:  03/10/24.

## 2024-01-06 NOTE — Telephone Encounter (Signed)
 Copied from CRM [id number]. Topic: Clinical - Medical Advice >> Jan 06, 2024 10:11 AM Farrel B wrote: Reason for CRM: Please call patient's sister at the number listed in the summary to discuss Hep C with her she states the pharmacy at the hospital advised her pt was clear but wanted to make sure    ----------------------------------------------------------------------- From previous Reason for Contact - Scheduling: Patient/patient representative is calling to schedule an appointment. Refer to attachments for appointment information.

## 2024-01-07 ENCOUNTER — Telehealth: Payer: Self-pay | Admitting: *Deleted

## 2024-01-07 ENCOUNTER — Other Ambulatory Visit: Payer: Self-pay | Admitting: Student

## 2024-01-07 ENCOUNTER — Ambulatory Visit: Admitting: Physician Assistant

## 2024-01-07 ENCOUNTER — Ambulatory Visit: Payer: Self-pay

## 2024-01-07 ENCOUNTER — Ambulatory Visit (INDEPENDENT_AMBULATORY_CARE_PROVIDER_SITE_OTHER): Admitting: Student

## 2024-01-07 ENCOUNTER — Encounter: Payer: Self-pay | Admitting: Student

## 2024-01-07 VITALS — BP 112/70 | HR 83 | Temp 97.8°F | Ht 70.0 in | Wt 174.6 lb

## 2024-01-07 DIAGNOSIS — E119 Type 2 diabetes mellitus without complications: Secondary | ICD-10-CM

## 2024-01-07 DIAGNOSIS — K120 Recurrent oral aphthae: Secondary | ICD-10-CM | POA: Insufficient documentation

## 2024-01-07 LAB — GLUCOSE, CAPILLARY: Glucose-Capillary: 171 mg/dL — ABNORMAL HIGH (ref 70–99)

## 2024-01-07 MED ORDER — BENZOCAINE 10 % MT GEL: 1.0000 | OROMUCOSAL | 0 refills | Status: DC | PRN

## 2024-01-07 NOTE — Patient Instructions (Signed)
 Mr Cali, [place] likely you are dealing with a canker sore. These are very painful, but will go away. Use orajel as needed for relief. Return if worsening or spreading in one week. - Dr Kathey Simer

## 2024-01-07 NOTE — Telephone Encounter (Signed)
 Patient is scheduled to see Dr.Juberg this afternoon.

## 2024-01-07 NOTE — Telephone Encounter (Addendum)
 FYI Only or Action Required?: Action required by provider: No available appts today, [facility] info given, advised UC/ED if symptoms worsen or unable to be seen at [facility]. Patient was adamant about not going to UC/ED and was upset there was no available appts with PCP, informed patient need to be seen today.  Patient was last seen in primary care on 12/10/2023 by Celestina Czar, MD.  Called Nurse Triage reporting Mouth Lesions.  Symptoms began yesterday.  Interventions attempted: Other: warm water.  Symptoms are: unchanged.  Triage Disposition: See Physician Within 24 Hours  Patient/caregiver understands and will follow disposition?: Yes      Copied from CRM #[id number]. Topic: Clinical - Red Word Triage >> Jan 07, 2024  9:36 AM Mercer PEDLAR wrote: Red Word that prompted transfer to Nurse Triage: Painful mouth sore. Reason for Disposition  Weak immune system (e.g., HIV positive, cancer chemo, splenectomy, organ transplant, chronic steroids)  Answer Assessment - Initial Assessment Questions No available appts in clinic today. Advised to be seen today, gave [facility] info, advised UC/ED if worsen or unable to be seen at [facility].  Patient reports hurts to swallow and can barely take medication, tried warm water  Patient reports currently has cancer, but has completed all treatments.  1. LOCATION: Where is the mouth sore (ulcer) located?      Right side 2. NUMBER: How many sores are there? :     Unsure, unable to look at tongue at this time. Pt reports did not see anything yesterday when he looked. 3. SIZE: How large is the sore?  (e.g., size of an apple seed, watermelon seed, pencil eraser)     unsure 4. PAIN: Are they painful? If Yes, ask: How bad is it?  (Scale 0-10; or none, mild, moderate, severe)     10/10 5. ONSET: When did you first notice the sore?      yesterday 6. RECURRENT SYMPTOM: Have you had a mouth ulcer  before? If Yes, ask: When was the last time? and What happened that time?      Unsure, think may happened years ago 7. CAUSE: What do you think is causing the mouth sore?     Unsure, have no clue 8. OTHER SYMPTOMS: Do you have any other symptoms? (e.g., fever, swollen lymph node)     Denies fever, swollen lymph nodes, difficulty breathing, weakness, rash  Protocols used: Mouth Ulcers-A-AH

## 2024-01-07 NOTE — Telephone Encounter (Signed)
 FYI Only or Action Required?: Action required by provider: request for appointment and update on patient condition.  Patient was last seen in primary care on 12/10/2023 by Celestina Czar, MD.  Called Nurse Triage reporting tongue pain.  Symptoms began several days ago.  Interventions attempted: Rest, hydration, or home remedies and Dietary changes.  Symptoms are: unchanged.  Triage Disposition: See HCP Within 4 Hours (Or PCP Triage)  Patient/caregiver understands and will follow disposition?: Unsure  Copied from CRM #[id number]. Topic: Clinical - Red Word Triage >> Jan 07, 2024  9:03 AM Jared Shea wrote: Red Word that prompted transfer to Nurse Triage: M Patient sister Jared Shea is not with PT but PT has Blood sugar is 150 now but was at 75 last night /tongue swollen and sore hard to eat. Reason for Disposition  [1] SEVERE mouth pain (e.g., excruciating) AND [2] not improved after 2 hours of pain medicine  Answer Assessment - Initial Assessment Questions Additional info: Sister calling to schedule acute visit, she is not with patient at this time. Patient  discharged 01/01/24 from hospital for dka. Last night his sugar was 75, he drank orange juice and sugar wnl. Current blood sugar is 150. For two days he has been dealing a sore tongue that is causing pain when he swallows, he is eating less due to pain. Denies oral and facial swelling, denies sores & rashes. He is able to eat soft foods. No acute appointments are available to schedule, called to [facility] for to see if patient can be worked in but they have no availability today, was advised to send message to clinic nurse triage. Advised sister to proceed to urgent care for current symptoms. Message being sent to schedule hospital follow up visit, none are available with pcp until September 23rd.    1. ONSET: When did your mouth start hurting? (e.g., hours or days ago)      2 days 2. SEVERITY: How bad is the pain? (Scale 1-10; or mild, moderate  or severe)     Tongue is sore, hurts to eat.  3. SORES: Are there any sores or ulcers in the mouth? If Yes, ask: What part of the mouth are the sores in?     Unsure 4. FEVER: Do you have a fever? If Yes, ask: What is your temperature, how was it measured, and when did it start?     Unsure-not measured 5. CAUSE: What do you think is causing the mouth pain?     Unsure 6. OTHER SYMPTOMS: Do you have any other symptoms? (e.g., difficulty breathing)     Denies ulcers, difficulty breathing, and swelling.  Protocols used: Mouth Pain-A-AH

## 2024-01-07 NOTE — Telephone Encounter (Signed)
 Patient was identified as falling into the [place] Measure - Diabetes.   Patient was: Appointment already scheduled for:  03/10/24.

## 2024-01-07 NOTE — Telephone Encounter (Signed)
 Called pt to discuss his Hep C test.  Left VM on sisters phone.  Will keep trying.

## 2024-01-07 NOTE — Assessment & Plan Note (Signed)
 Acute evaluation of right tongue pain.  This started yesterday, he woke up with the pain.  On the right side, no bleeding, no fevers, no chills.  No history of rheumatologic disease and no new sexual partners.  On exam, small sores noted on the right, consistent with a canker sore.  No apparent infection.  Treat with topical benzocaine .  I have advised him to return as needed for this issue if it has not resolved by the middle of next week.  He wanted to talk about his insulin  as well since he is eating less due to the tongue pain.  I advised against mealtime insulin  when he does not eat a meal.  He will take half dose of his glargine today and tomorrow during time of reduced intake and he is checking his sugars regularly.  There is no severe hyper or hypoglycemia.

## 2024-01-07 NOTE — Progress Notes (Signed)
   CC: Tongue pain  HPI:  Jared Shea. is a 68 y.o. male with a PMH stated below who presents today for evaluation of acute tongue pain.  Please see problem based assessment and plan for additional details.  Past Medical History:  Diagnosis Date   Brain mass    brain cancer   Bursitis of right hip    Chest pain 08/12/2018   Chronic cough    Dependence on nicotine  from cigarettes    Diabetes mellitus without complication (HCC)    Dysuria 04/03/2021   Essential hypertension 07/28/2018   Frequent headaches 06/27/2020   Metastatic lung cancer (metastasis from lung to other site) [facility])    nscl ca dx'd 12/2019   Polyarthralgia 06/15/2020   Seizures (HCC)    Viral illness 03/23/2019    Review of Systems: ROS negative except for what is noted on the assessment and plan.  Vitals:   01/07/24 1331  BP: (!) 141/75  Pulse: 82  Temp: 97.8 F (36.6 C)  TempSrc: Oral  SpO2: 95%  Weight: 174 lb 9.6 oz (79.2 kg)  Height: 5' 10 (1.778 m)    Physical Exam: Constitutional: well-appearing man in no acute distress HENT: normocephalic atraumatic, mucous membranes moist. There is a small amphous ulcer on the lateral R tongue. No bleeding. No odor. Cardiovascular: regular rate and rhythm, no m/r/g Pulmonary/Chest: normal work of breathing on room air, lungs clear to auscultation bilaterally Psych: normal mood and behavior  Assessment & Plan:   Patient discussed with Dr. Karna  Canker sore Acute evaluation of right tongue pain.  This started yesterday, he woke up with the pain.  On the right side, no bleeding, no fevers, no chills.  No history of rheumatologic disease and no new sexual partners.  On exam, small sores noted on the right, consistent with a canker sore.  No apparent infection.  Treat with topical benzocaine .  I have advised him to return as needed for this issue if it has not resolved by the middle of next week.  He wanted to talk about his insulin  as well since he is  eating less due to the tongue pain.  I advised against mealtime insulin  when he does not eat a meal.  He will take half dose of his glargine today and tomorrow during time of reduced intake and he is checking his sugars regularly.  There is no severe hyper or hypoglycemia.  RTC prn for this issue. He has regular office visit in October.   Lonni Africa, D.O. [facility] Medicine, PGY-2 Phone: [phone] Date 01/07/2024 Time 2:45 PM

## 2024-01-08 ENCOUNTER — Telehealth: Payer: Self-pay | Admitting: *Deleted

## 2024-01-08 NOTE — Telephone Encounter (Signed)
 Copied from CRM [id number]. Topic: Clinical - Lab/Test Results >> Jan 08, 2024  9:52 AM Marda MATSU wrote: Sister Cindia returning Dr Loa call.

## 2024-01-08 NOTE — Telephone Encounter (Signed)
Called back and left VM

## 2024-01-09 NOTE — Telephone Encounter (Signed)
 I spoke with Jared Shea's sister and relayed that his HCV RNA is (-).  I suggested that cure after 6 weeks of therapy is not unheard of and that repeating his HCV RNA in 2-3 months would be the next course of action.  I'll ask the clinic nurses to get him back in with me for a repeat test in 2-3 months.  thanks

## 2024-01-10 NOTE — Progress Notes (Signed)
 Internal Medicine Clinic Attending  Case discussed with the resident at the time of the visit.  We reviewed the resident's history and exam and pertinent patient test results.  I agree with the assessment, diagnosis, and plan of care documented in the resident's note.

## 2024-01-14 ENCOUNTER — Encounter (HOSPITAL_COMMUNITY): Payer: Self-pay

## 2024-01-14 ENCOUNTER — Emergency Department (HOSPITAL_COMMUNITY)
Admission: EM | Admit: 2024-01-14 | Discharge: 2024-01-14 | Disposition: A | Discharge status: Home / Self Care | Attending: Emergency Medicine | Admitting: Emergency Medicine

## 2024-01-14 ENCOUNTER — Other Ambulatory Visit: Payer: Self-pay

## 2024-01-14 DIAGNOSIS — Z85118 Personal history of other malignant neoplasm of bronchus and lung: Secondary | ICD-10-CM | POA: Diagnosis not present

## 2024-01-14 DIAGNOSIS — Z85841 Personal history of malignant neoplasm of brain: Secondary | ICD-10-CM | POA: Insufficient documentation

## 2024-01-14 DIAGNOSIS — E119 Type 2 diabetes mellitus without complications: Secondary | ICD-10-CM | POA: Insufficient documentation

## 2024-01-14 DIAGNOSIS — Z7982 Long term (current) use of aspirin: Secondary | ICD-10-CM | POA: Insufficient documentation

## 2024-01-14 DIAGNOSIS — Z794 Long term (current) use of insulin: Secondary | ICD-10-CM | POA: Diagnosis not present

## 2024-01-14 DIAGNOSIS — Z452 Encounter for adjustment and management of vascular access device: Secondary | ICD-10-CM | POA: Diagnosis not present

## 2024-01-14 DIAGNOSIS — Z7984 Long term (current) use of oral hypoglycemic drugs: Secondary | ICD-10-CM | POA: Insufficient documentation

## 2024-01-14 DIAGNOSIS — Z95828 Presence of other vascular implants and grafts: Secondary | ICD-10-CM

## 2024-01-14 MED ORDER — HEPARIN SOD (PORK) LOCK FLUSH 100 UNIT/ML IV SOLN: 500.0000 [IU] | Freq: Once | INTRAVENOUS | Status: DC

## 2024-01-14 NOTE — ED Provider Notes (Signed)
 [place] EMERGENCY DEPARTMENT AT [facility] Provider Note   CSN: [id number] Arrival date & time: 01/14/24  9095     Patient presents with: No chief complaint on file.   Jared Shea. is a 68 y.o. male with history of brain cancer in remission, type 2 diabetes, lung cancer with port in right chest presents to the emerged from today for evaluation of his port still being accessed.  Patient was discharged from the hospital on 8 - 20 - 25 after being admitted for DKA and possibly early dementia.  The patient reports that his port has still been accessed since then and is coming here for to be deaccessed.  He denies any new symptoms.  He feels at his baseline of health.  He reports he has not had an accident since being admitted in the hospital.  He denies any fever, chest pain, shortness of breath.  HPI     Prior to Admission medications   Medication Sig Start Date End Date Taking? Authorizing Provider  aspirin  EC 81 MG tablet Take 1 tablet (81 mg total) by mouth daily. Swallow whole. 01/08/23   Tobie Gaines, DO  atorvastatin  (LIPITOR) 20 MG tablet Take 1 tablet (20 mg total) by mouth daily. 09/05/23 09/04/24  Elicia Sharper, DO  benzocaine  (ORAJEL) 10 % mucosal gel Use as directed 1 Application in the mouth or throat as needed for mouth pain. 01/07/24   Harrie Bruckner, DO  Blood Glucose Monitoring Suppl (BLOOD GLUCOSE MONITOR SYSTEM) w/Device KIT Use as directed to check blood sugar three times daily 01/01/24   Jillian Buttery, MD  Glucose Blood (BLOOD GLUCOSE TEST STRIPS) STRP Use as directed to check blood sugar three times daily as directed 01/01/24   Jillian Buttery, MD  insulin  aspart (NOVOLOG ) 100 UNIT/ML FlexPen Inject 5 Units into the skin 3 (three) times daily with meals. Only take if eating a meal AND Blood Glucose (BG) is 80 or higher. 01/01/24   Jillian Buttery, MD  insulin  glargine (LANTUS ) 100 UNIT/ML Solostar Pen Inject 20 Units into the skin daily. 01/01/24    Jillian Buttery, MD  Insulin  Pen Needle (PEN NEEDLES) 31G X 5 MM MISC Use to inject insulin  three times daily as directed 01/01/24   Jillian Buttery, MD  isosorbide  mononitrate (IMDUR ) 60 MG 24 hr tablet Take 60 mg by mouth daily. 09/10/23   [provider]  Lancet Device MISC 1 each by Does not apply route 3 (three) times daily. May dispense any manufacturer covered by patient's insurance. 01/01/24   Jillian Buttery, MD  Lancets MISC Use to check blood sugar three times daily as directed 01/01/24   Jillian Buttery, MD  levETIRAcetam  (KEPPRA ) 750 MG tablet TAKE 1 TABLET(750 MG) BY MOUTH TWICE DAILY 05/06/23   Vaslow, Zachary K, MD  losartan -hydrochlorothiazide  (HYZAAR) 50-12.5 MG tablet Take 1 tablet by mouth daily. 09/05/23 09/04/24  Zheng, Michael, DO  melatonin 5 MG TABS Take 1 tablet (5 mg total) by mouth at bedtime as needed (for sleep). Patient not taking: Reported on 12/29/2023 08/20/22   Jinwala, Sagar H, MD  metFORMIN  (GLUCOPHAGE ) 500 MG tablet TAKE 1 TABLET(500 MG) BY MOUTH TWICE DAILY WITH A MEAL 01/07/24   Nooruddin, Saad, MD  metoprolol  succinate (TOPROL  XL) 25 MG 24 hr tablet Take 0.5 tablets (12.5 mg total) by mouth at bedtime. 09/20/23   Alexander-Savino, [place], MD    Allergies: Jardiance  [empagliflozin ] and Penicillins    Review of Systems  Constitutional:  Negative for chills and fever.  Respiratory:  Negative for cough and shortness of breath.   Cardiovascular:  Negative for chest pain.   See HPI  Updated Vital Signs BP 117/78 (BP Location: Right Arm)   Pulse 60   Temp 97.8 F (36.6 C) (Oral)   Resp 16   Ht 5' 10 (1.778 m)   Wt 78.9 kg   SpO2 100%   BMI 24.97 kg/m   Physical Exam Vitals and nursing note reviewed.  Constitutional:      General: He is not in acute distress.    Appearance: He is not ill-appearing or toxic-appearing.  Eyes:     General: No scleral icterus. Cardiovascular:     Comments: Port with access present in the upper right chest. Please  see image.  There is no surrounding erythema.  Dressing appears to be intact. Pulmonary:     Effort: Pulmonary effort is normal. No respiratory distress.  Skin:    General: Skin is warm and dry.  Neurological:     Mental Status: He is alert.     (all labs ordered are listed, but only abnormal results are displayed) Labs Reviewed - No data to display  EKG: None  Radiology: No results found.  Procedures   Medications Ordered in the ED  heparin  lock flush 100 unit/mL (has no administration in time range)    Clinical Course as of 01/14/24 1035  Tue Jan 14, 2024  1008 Spoke with IV team nurse who is coming to evaluate the site and deaccess the port.  [RR]    Clinical Course User Index [RR] Bernis Ernst, PA-C    Medical Decision Making Risk Prescription drug management.   68 y.o. male presents to the ER today for evaluation of port issue. Differential diagnosis includes but is not limited to port access problem, infection. Vital signs unremarkable. Physical exam as noted above.   Port is well appearing.  Does not appear infected.  There is no erythema or increase in warmth.  Dressing overall intact. VSS. I paged IV team and spoke with Dorie, RN who came to deaccessed the port and heparin  flush.  She reports that the port was well-healing and was without difficulty with flushing. Dry dressing overlying the area. Discussed that he will need to follow up in a month for his monthly flush. He is stable for discharge home.   We discussed plan at bedside. We discussed strict return precautions and red flag symptoms. The patient verbalized their understanding and agrees to the plan. The patient is stable and being discharged home in good condition.  Portions of this report may have been transcribed using voice recognition software. Every effort was made to ensure accuracy; however, inadvertent computerized transcription errors may be present.    Final diagnoses:  Port-A-Cath in  place    ED Discharge Orders     None          Bernis Ernst, [place] 01/14/24 1046    Freddi Hamilton, MD 01/14/24 1334

## 2024-01-14 NOTE — ED Triage Notes (Signed)
 Pt presents to ED from home requesting port-a-cath to be deaccessed. Pt reports he was in the hospital about 2 weeks ago and it was accessed, but did not get deaccessed.

## 2024-01-14 NOTE — Discharge Instructions (Addendum)
 Please make sure that you follow up with the cancer center for your monthly flush. Observe the skin to make sure there is not any drainage, increase in warmth, or redness. If you have any of this or start experiencing symptoms such as body aches, fever, chest pain, shortness breath, cough, please return to your nearest emergency department for reevaluation.  If you have any other concerns, new or worsening symptoms, please return to your nearest emergency department for reevaluation.

## 2024-01-16 ENCOUNTER — Telehealth: Payer: Self-pay | Admitting: *Deleted

## 2024-01-16 NOTE — Telephone Encounter (Signed)
 PC to patient, spoke with his sister Cindia - informed her of upcoming port flush, MRI & MD appointments.  Informed Arlene since his appointments for port flush & MRI are the same afternoon, his port can remain accessed for his MRI.  She verbalizes understanding.

## 2024-01-16 NOTE — Telephone Encounter (Signed)
 Message has been forwarded to [facility] [employer] to f/u with the FMLA forms.  Copied from CRM [id number]. Topic: General - Other >> Jan 14, 2024  9:27 AM Carmell SAUNDERS wrote: Reason for CRM: Pt's son [name] says that his employer's 3rd party sent over fmla paperwork around 8/18 and they have not heard anything back yet. Paperwork has a deadline for 01/19/2024. Please follow up to confirm if the paperwork has been received. [phone]. He stated he will have them resend the paperwork again today and possibly request an extension.

## 2024-01-17 ENCOUNTER — Other Ambulatory Visit: Payer: Self-pay | Admitting: Student

## 2024-01-17 NOTE — Telephone Encounter (Signed)
 I called and spoke with pt's son Jared Shea) regarding his fmla paperwork. I told him I received the paperwork on 01/16/2024. Form is in the blue team box under miscellaneous. Pt has upcoming appt with Dr. Kandis for HFU, and pt's son states he will attend the appointment with his father.

## 2024-01-22 ENCOUNTER — Other Ambulatory Visit: Payer: Self-pay

## 2024-01-22 ENCOUNTER — Other Ambulatory Visit: Payer: Self-pay | Admitting: Dietician

## 2024-01-22 ENCOUNTER — Encounter: Payer: Self-pay | Admitting: Student

## 2024-01-22 ENCOUNTER — Ambulatory Visit (INDEPENDENT_AMBULATORY_CARE_PROVIDER_SITE_OTHER): Admitting: Student

## 2024-01-22 ENCOUNTER — Ambulatory Visit: Admitting: Dietician

## 2024-01-22 VITALS — BP 116/60 | HR 83 | Temp 97.6°F | Ht 72.0 in | Wt 176.8 lb

## 2024-01-22 DIAGNOSIS — R413 Other amnesia: Secondary | ICD-10-CM

## 2024-01-22 DIAGNOSIS — Z7984 Long term (current) use of oral hypoglycemic drugs: Secondary | ICD-10-CM

## 2024-01-22 DIAGNOSIS — E119 Type 2 diabetes mellitus without complications: Secondary | ICD-10-CM | POA: Diagnosis not present

## 2024-01-22 DIAGNOSIS — Z794 Long term (current) use of insulin: Secondary | ICD-10-CM | POA: Diagnosis not present

## 2024-01-22 MED ORDER — INSULIN GLARGINE 100 UNIT/ML SOLOSTAR PEN: 15.0000 [IU] | PEN_INJECTOR | Freq: Every day | SUBCUTANEOUS | 0 refills | Status: DC

## 2024-01-22 MED ORDER — PEN NEEDLES 31G X 5 MM MISC: 1.0000 | Freq: Three times a day (TID) | 3 refills | Status: DC

## 2024-01-22 MED ORDER — DEXCOM G7 SENSOR MISC: 3 refills | Status: DC

## 2024-01-22 NOTE — Assessment & Plan Note (Signed)
>>  ASSESSMENT AND PLAN FOR UNCONTROLLED TYPE 2 DIABETES MELLITUS WITH HYPERGLYCEMIA, WITHOUT LONG-TERM CURRENT USE OF INSULIN  (HCC) WRITTEN ON 01/22/2024  4:17 PM BY BENDER, EMILY, DO  Patient was hospitalized from 8/17 until 8/20 with DKA.  During the hospitalization, his A1c was recorded at 13.1 and he was discharged home on Lantus  20 units daily, mealtime insulin  5 units 3 times daily and continued his metformin  500 mg twice a day.  Since hospitalization, patient's sister has reported hypoglycemic events in the 60s to 70s numerous times.  During this time, patient feels unwell and fatigued.  Patient's sister has removed the mealtime insulin  from his medication regimen at this time. He reports polyuria and polydipsia.  Plan: - Will decrease insulin  Lantus  to 15 units daily, patient and family was provided instructions to call if glucose is less than 70 -Will uptitrate metformin , patient is currently on 500 twice daily.  Patient was provided instructions to increase metformin  to 1500 mg this week followed by 2000 mg the next week -UACR collected today - [name] met with patient today and demonstrated CGM use

## 2024-01-22 NOTE — Progress Notes (Signed)
 Established Patient Office Visit  Subjective   Patient ID: Jared Shea., male    DOB: 1956/04/20  Age: 68 y.o. MRN: [id number]  Chief Complaint  Patient presents with   Transitions Of Care    HFU     Jared Shea. is a 68 y.o. who presents to the clinic for a HFU. Please see problem based assessment and plan for additional details.   Patient was hospitalized from 8/17 until 8/20 with DKA.  During the hospitalization, his A1c was recorded at 13.1 and he was discharged home on Lantus  20 units daily, mealtime insulin  5 units 3 times daily and continued his metformin  500 mg twice a day.  Since hospitalization, patient's sister has reported hypoglycemic events in the 60s to 70s numerous times.  During this time, patient feels unwell and fatigued.  Patient's sister has removed the mealtime insulin  from his medication regimen at this time.   Additionally, there is concerns about encephalopathy versus delirium versus maybe early dementia during hospitalization.  TSH and B12 were both checked during that time and were within normal limits.  A brain MRI was also checked which did show mild background cerebral white matter chronic vessel disease without acute changes.  Patient was evaluated for memory issues in the past as early as 2021, in 2022  MoCA was performed with neurology and he scored a 13 out of 30.  Of note patient does have a history of a craniotomy in 2021 for resection of a metastatic tumor 2/2 NSCLC. Patient's son reported that the patient seems back to his baseline since leaving the hospital.  Medication reconciliation was performed today.   Patient Active Problem List   Diagnosis Date Noted   Canker sore 01/07/2024   Acute metabolic encephalopathy 12/29/2023   Chronic congestive heart failure (HCC) 12/29/2023   CAD (coronary artery disease) 12/29/2023   DKA (diabetic ketoacidosis) (HCC) 12/28/2023   Hepatitis C 09/12/2023   Hyperlipidemia 02/19/2023   Sleep disorder,  circadian 08/20/2022   Cardiomyopathy, unspecified (HCC) 08/20/2022   PVC (premature ventricular contraction) 05/21/2022   Health care maintenance 03/16/2022   Bilateral primary osteoarthritis of knee 12/06/2021   Erectile dysfunction 07/11/2021   Vision changes 03/14/2021   Memory loss 03/14/2021   Port-A-Cath in place 07/05/2020   Encounter for antineoplastic immunotherapy 04/18/2020   Adenocarcinoma, metastatic (HCC) 01/07/2020   Diabetes mellitus (HCC) 01/06/2020   Adjustment disorder with depressed mood 01/06/2020   Thyroid  nodule 01/04/2020   Non-small cell carcinoma of lung, stage 4 w/ mets to brain (HCC) 12/31/2019   Malignant neoplasm metastatic to brain (HCC) 12/23/2019   Partial symptomatic epilepsy with complex partial seizures, not intractable, without status epilepticus (HCC) 12/09/2019   Essential hypertension 07/28/2018       Objective:     BP 116/60 (BP Location: Right Arm, Patient Position: Sitting, Cuff Size: Large)   Pulse 83   Temp 97.6 F (36.4 C) (Oral)   Ht 6' (1.829 m)   Wt 176 lb 12.8 oz (80.2 kg)   SpO2 99%   BMI 23.98 kg/m  BP Readings from Last 3 Encounters:  01/22/24 116/60  01/14/24 125/80  01/07/24 112/70   Wt Readings from Last 3 Encounters:  01/22/24 176 lb 12.8 oz (80.2 kg)  01/14/24 174 lb (78.9 kg)  01/07/24 174 lb 9.6 oz (79.2 kg)      Physical Exam Vitals reviewed.  Constitutional:      General: He is not in acute distress.    Appearance: He  is not ill-appearing, toxic-appearing or diaphoretic.  Cardiovascular:     Rate and Rhythm: Normal rate and regular rhythm.     Heart sounds: No murmur heard. Pulmonary:     Effort: Pulmonary effort is normal. No respiratory distress.     Breath sounds: Normal breath sounds. No wheezing.  Abdominal:     General: There is no distension.     Palpations: Abdomen is soft.     Tenderness: There is no abdominal tenderness.  Skin:    General: Skin is warm and dry.  Neurological:      Mental Status: He is alert and oriented to person, place, and time.  Psychiatric:        Mood and Affect: Mood and affect normal.        Behavior: Behavior normal.      No results found for any visits on 01/22/24.  Last metabolic panel Lab Results  Component Value Date   GLUCOSE 319 (H) 12/31/2023   NA 133 (L) 12/31/2023   K 4.4 12/31/2023   CL 98 12/31/2023   CO2 21 (L) 12/31/2023   BUN 11 12/31/2023   CREATININE 0.73 12/31/2023   GFRNONAA >60 12/31/2023   CALCIUM  9.4 12/31/2023   PROT 8.0 12/29/2023   ALBUMIN 4.1 12/29/2023   BILITOT 1.0 12/29/2023   ALKPHOS 65 12/29/2023   AST 14 (L) 12/29/2023   ALT 11 12/29/2023   ANIONGAP 14 12/31/2023   Last hemoglobin A1c Lab Results  Component Value Date   HGBA1C 13.1 (H) 12/28/2023   Last thyroid  functions Lab Results  Component Value Date   TSH 0.988 12/31/2023   T4TOTAL 11.2 04/10/2022   Last vitamin B12 and Folate Lab Results  Component Value Date   VITAMINB12 423 12/31/2023      The ASCVD Risk score (Arnett DK, et al., 2019) failed to calculate for the following reasons:   The valid total cholesterol range is 130 to 320 mg/dL    Assessment & Plan:   Problem List Items Addressed This Visit       Endocrine   Diabetes mellitus (HCC) - Primary   Patient was hospitalized from 8/17 until 8/20 with DKA.  During the hospitalization, his A1c was recorded at 13.1 and he was discharged home on Lantus  20 units daily, mealtime insulin  5 units 3 times daily and continued his metformin  500 mg twice a day.  Since hospitalization, patient's sister has reported hypoglycemic events in the 60s to 70s numerous times.  During this time, patient feels unwell and fatigued.  Patient's sister has removed the mealtime insulin  from his medication regimen at this time. He reports polyuria and polydipsia.  Plan: - Will decrease insulin  Lantus  to 15 units daily, patient and family was provided instructions to call if glucose is less than  70 -Will uptitrate metformin , patient is currently on 500 twice daily.  Patient was provided instructions to increase metformin  to 1500 mg this week followed by 2000 mg the next week -UACR collected today - Jared Shea met with patient today and demonstrated CGM use      Relevant Medications   insulin  glargine (LANTUS ) 100 UNIT/ML Solostar Pen   Other Relevant Orders   Microalbumin / Creatinine Urine Ratio     Other   Memory loss   Additionally, there is concerns about encephalopathy versus delirium versus maybe early dementia during hospitalization.  TSH and B12 were both checked during that time and were within normal limits.  A brain MRI was also checked which did  show mild background cerebral white matter chronic vessel disease without acute changes.  Patient was evaluated for memory issues in the past as early as 2021, in 2022  MoCA was performed with neurology and he scored a 13 out of 30.  Of note patient does have a history of a craniotomy in 2021 for resection of a metastatic tumor 2/2 NSCLC. Patient's son reported that the patient seems back to his baseline since leaving the hospital.  On exam, patient is alert and oriented x 3 and a mini cog was performed: He scored 1/5.  PHQ-9 recorded at 5.  Plan: - Patient referred to geriatric clinic -Spoke with Donia who will schedule patient for a 2-hour follow-up with Dr. Trudy for additional cognitive testing       Return in about 4 weeks (around 02/19/2024) for T2DM, assess for Hypoglycemia .    Damien Lease, DO

## 2024-01-22 NOTE — Assessment & Plan Note (Addendum)
 Additionally, there is concerns about encephalopathy versus delirium versus maybe early dementia during hospitalization.  TSH and B12 were both checked during that time and were within normal limits.  A brain MRI was also checked which did show mild background cerebral white matter chronic vessel disease without acute changes.  Patient was evaluated for memory issues in the past as early as 2021, in 2022  MoCA was performed with neurology and he scored a 13 out of 30.  Of note patient does have a history of a craniotomy in 2021 for resection of a metastatic tumor 2/2 NSCLC. Patient's son reported that the patient seems back to his baseline since leaving the hospital.  On exam, patient is alert and oriented x 3 and a mini cog was performed: He scored 1/5.  PHQ-9 recorded at 5.  Plan: - Patient referred to geriatric clinic -Spoke with Donia who will schedule patient for a 2-hour follow-up with Dr. Trudy for additional cognitive testing

## 2024-01-22 NOTE — Patient Instructions (Signed)
 Hello,  Thank you for your visit today!   You were given a Dexcom G7 sample receiver and sensor. You placed the sensor and started it with the receiver.   This will help you to monitor your blood sugar better. You will get a new reading every 5 minutes.   You should have a prescription at your pharmacy for more sensors. When the one he is wearing stops in 10 days,  Remove it and replace it with a new sensor and start it.   Please bring the receiver to all office visits so we can know how your blood sugars are doing at home.  I have scheduled you to see me briefly on September 30th when you see Dr. Mliss Jenkins Pouch.   Call anytime with questions or concerns.  Sincerely,   [name] Hole  Diabetes Educator [phone] (new number)

## 2024-01-22 NOTE — Patient Instructions (Addendum)
 Thank you, [name] for allowing us  to provide your care today. Today we discussed diabetes and memory trouble.  For the diabetes: please lower the once a day insulin  to 15 units. -Please take two tablets in the morning and one tablet in the evening (1,500 mg), then in one week please take two tablets in the morning and two tablets in the evening (2,000 mg)  -CALL if you have any more lows (below 70) and we can change your insulin  again   For the memory:  -I am going to have you see another one of our doctors who will go over more memory testing during a longer appointment.    For the Hepatitis C: Please see Dr. Eben  I have ordered the following labs for you:   Lab Orders         Microalbumin / Creatinine Urine Ratio      Referrals ordered today:   Referral Orders  No referral(s) requested today     I have ordered the following medication/changed the following medications:   Stop the following medications: Medications Discontinued During This Encounter  Medication Reason   insulin  aspart (NOVOLOG ) 100 UNIT/ML FlexPen    insulin  glargine (LANTUS ) 100 UNIT/ML Solostar Pen Reorder     Start the following medications: Meds ordered this encounter  Medications   insulin  glargine (LANTUS ) 100 UNIT/ML Solostar Pen    Sig: Inject 15 Units into the skin daily.    Dispense:  15 mL    Refill:  0     Follow up: One month for diabetes    Should you have any questions or concerns please call the internal medicine clinic at [phone].     Please note that our late policy has changed.  If you are more than 15 minutes late to your appointment, you may be asked to reschedule your appointment.  Dr. Kandis, D.O. [facility]

## 2024-01-22 NOTE — Assessment & Plan Note (Addendum)
 Patient was hospitalized from 8/17 until 8/20 with DKA.  During the hospitalization, his A1c was recorded at 13.1 and he was discharged home on Lantus  20 units daily, mealtime insulin  5 units 3 times daily and continued his metformin  500 mg twice a day.  Since hospitalization, patient's sister has reported hypoglycemic events in the 60s to 70s numerous times.  During this time, patient feels unwell and fatigued.  Patient's sister has removed the mealtime insulin  from his medication regimen at this time. He reports polyuria and polydipsia.  Plan: - Will decrease insulin  Lantus  to 15 units daily, patient and family was provided instructions to call if glucose is less than 70 -Will uptitrate metformin , patient is currently on 500 twice daily.  Patient was provided instructions to increase metformin  to 1500 mg this week followed by 2000 mg the next week -UACR collected today - Jared Shea met with patient today and demonstrated CGM use

## 2024-01-22 NOTE — Progress Notes (Signed)
 Wt Readings from Last 10 Encounters:  01/22/24 176 lb 12.8 oz (80.2 kg)  01/14/24 174 lb (78.9 kg)  01/07/24 174 lb 9.6 oz (79.2 kg)  12/29/23 162 lb 0.6 oz (73.5 kg)  12/28/23 163 lb 2.3 oz (74 kg)  12/10/23 164 lb (74.4 kg)  10/30/23 186 lb 9.6 oz (84.6 kg)  10/08/23 188 lb 6.4 oz (85.5 kg)  09/26/23 189 lb 3.2 oz (85.8 kg)  09/24/23 180 lb 4 oz (81.8 kg)   BP Readings from Last 3 Encounters:  01/22/24 116/60  01/14/24 125/80  01/07/24 112/70   Lab Results  Component Value Date   HGBA1C 13.1 (H) 12/28/2023   HGBA1C 6.1 (A) 09/05/2023   HGBA1C 5.8 (A) 02/08/2023   HGBA1C 5.7 (A) 08/20/2022   HGBA1C 5.7 (A) 05/21/2022    Diabetes Self-Management Education  Visit Type: Annual Follow-Up  Appt. Start Time: 1035 Appt. End Time: 1105  01/22/2024  [name], identified by name and date of birth, is a 68 y.o. male with a diagnosis of Diabetes:  .   ASSESSMENT  There were no vitals taken for this visit. There is no height or weight on file to calculate BMI.   Diabetes Self-Management Education - 01/22/24 1100       Visit Information   Visit Type Annual Follow-Up      Health Coping   How would you rate your overall health? Good      Psychosocial Assessment   Patient Belief/Attitude about Diabetes Motivated to manage diabetes    What is the hardest part about your diabetes right now, causing you the most concern, or is the most worrisome to you about your diabetes?   Checking blood sugar   does not like sticking fingers, in transition from well controlled diabetes to DKA and now well controlled again per family on long acting insulin  and metformin  only   Self-care barriers Lack of transportation;Debilitated state due to current medical condition;Lack of material resources    Self-management support Family;CDE visits;Doctor's office    Other persons present Family Member    Patient Concerns Monitoring    Special Needs Simplified materials;Instruct caregiver     Preferred Learning Style Hands on    Learning Readiness Ready    How often do you need to have someone help you when you read instructions, pamphlets, or other written materials from your doctor or pharmacy? 3 - Sometimes    What is the last grade level you completed in school? --   unsure     Pre-Education Assessment   Patient understands the diabetes disease and treatment process. Comprehends key points    Patient understands incorporating nutritional management into lifestyle. Comprehends key points    Patient undertands incorporating physical activity into lifestyle. Comprehends key points    Patient understands using medications safely. Comprehends key points    Patient understands monitoring blood glucose, interpreting and using results Needs Instruction    Patient understands prevention, detection, and treatment of acute complications. Needs Review    Patient understands prevention, detection, and treatment of chronic complications. Compreheands key points    Patient understands how to develop strategies to address psychosocial issues. Comprehends key points    Patient understands how to develop strategies to promote health/change behavior. Comprehends key points      Complications   Last HgB A1C per patient/outside source 13.1 %   patient and son report blood glucose in 100s at home by fingerstick   How often do you check your  blood sugar? 1-2 times/day    Fasting Blood glucose range (mg/dL) 869-820    Postprandial Blood glucose range (mg/dL) 819-799    Number of hypoglycemic episodes per month 5    Can you tell when your blood sugar is low? Yes    What do you do if your blood sugar is low? penut butter    Number of hyperglycemic episodes ( >200mg /dL): Weekly    Can you tell when your blood sugar is high? Yes    What do you do if your blood sugar is high? takes insulin  drinks water, calls doctor    Have you had a dilated eye exam in the past 12 months? Yes    Have you had a dental  exam in the past 12 months? --   deferred   Are you checking your feet? Yes    How many days per week are you checking your feet? 7      Dietary Intake   Breakfast deferred      Activity / Exercise   Activity / Exercise Type ADL's   deferred     Patient Education   Previous Diabetes Education Yes   here in 06/2022   Monitoring Taught/evaluated CGM (comment)   a sample dexcom G7 receiver and sensor were given to patient today. His son applied sensor to his right back of arm and started it. It was in warm up when they left the office.   Acute complications Taught prevention, symptoms, and  treatment of hypoglycemia - the 15 rule.      Individualized Goals (developed by patient)   Monitoring  Consistenly use CGM      Post-Education Assessment   Patient understands monitoring blood glucose, interpreting and using results Comprehends key points    Patient understands prevention, detection, and treatment of acute complications. Comprehends key points      Outcomes   Expected Outcomes Demonstrated interest in learning. Expect positive outcomes    Future DMSE 4-6 wks    Program Status Not Completed   need to follow up on use of CGM     Subsequent Visit   Since your last visit have you continued or begun to take your medications as prescribed? No   family members says they stopped his short acting insulin  on their own at home prior to visit   Since your last visit have you had your blood pressure checked? Yes    Is your most recent blood pressure lower, unchanged, or higher since your last visit? Unchanged   well controlled   Since your last visit have you experienced any weight changes? Loss    Weight Loss (lbs) 4    Since your last visit, are you checking your blood glucose at least once a day? Yes   inpatient team applied Freestyle Lbire 3 sensor but patient's phone was not compatible with this- it was removed today.         Individualized Plan for Diabetes Self-Management Training:    Learning Objective:  Patient will have a greater understanding of diabetes self-management. Patient education plan is to attend individual and/or group sessions per assessed needs and concerns.   Plan:   Patient Instructions  Hello,  Thank you for your visit today!   You were given a Dexcom G7 sample receiver and sensor. You placed the sensor and started it with the receiver.   This will help you to monitor your blood sugar better. You will get a new reading every 5 minutes.  You should have a prescription at your pharmacy for more sensors. When the one he is wearing stops in 10 days,  Remove it and replace it with a new sensor and start it.   Please bring the receiver to all office visits so we can know how your blood sugars are doing at home.  I have scheduled you to see me briefly on September 30th when you see Dr. Mliss Jenkins Pouch.   Call anytime with questions or concerns.  Sincerely,   [name] Lakedra Washington  Diabetes Educator [phone] (new number)    Expected Outcomes:  Demonstrated interest in learning. Expect positive outcomes  Education material provided: Diabetes Resources  If problems or questions, patient to contact team via:  Phone  Future DSME appointment: 4-6 wks [name] Hole, RD 01/22/2024 12:03 PM.

## 2024-01-22 NOTE — Telephone Encounter (Signed)
 Requests referral, Dexcom G7 sensors and refill on pen needles

## 2024-01-23 ENCOUNTER — Telehealth: Payer: Self-pay

## 2024-01-23 LAB — MICROALBUMIN / CREATININE URINE RATIO
Creatinine, Urine: 117.8 mg/dL
Microalb/Creat Ratio: 5 mg/g{creat} (ref 0–29)
Microalbumin, Urine: 5.8 ug/mL

## 2024-01-23 NOTE — Telephone Encounter (Signed)
 I contacted pt's son to let him know that his fmla paperwork has been completed and faxed. Original copy is ready for pick up, form will be left at the front desk.

## 2024-01-24 ENCOUNTER — Ambulatory Visit: Payer: Self-pay | Admitting: Student

## 2024-01-27 NOTE — Progress Notes (Signed)
 Internal Medicine Clinic Attending  Case discussed with the resident at the time of the visit.  We reviewed the resident's history and exam and pertinent patient test results.  I agree with the assessment, diagnosis, and plan of care documented in the resident's note.

## 2024-01-29 ENCOUNTER — Ambulatory Visit (INDEPENDENT_AMBULATORY_CARE_PROVIDER_SITE_OTHER): Admitting: Infectious Diseases

## 2024-01-29 ENCOUNTER — Ambulatory Visit: Payer: Self-pay | Admitting: Infectious Diseases

## 2024-01-29 VITALS — BP 112/66 | HR 92 | Temp 98.1°F | Ht 72.0 in | Wt 172.6 lb

## 2024-01-29 DIAGNOSIS — C3491 Malignant neoplasm of unspecified part of right bronchus or lung: Secondary | ICD-10-CM | POA: Diagnosis not present

## 2024-01-29 DIAGNOSIS — B1710 Acute hepatitis C without hepatic coma: Secondary | ICD-10-CM

## 2024-01-29 DIAGNOSIS — E119 Type 2 diabetes mellitus without complications: Secondary | ICD-10-CM

## 2024-01-29 NOTE — Assessment & Plan Note (Signed)
 He'll f/u with Drs Elicia and Kandis

## 2024-01-29 NOTE — Assessment & Plan Note (Signed)
 States he is stable Off xrt and ctx, completed.

## 2024-01-29 NOTE — Assessment & Plan Note (Signed)
 He is doing well Will recheck his HCV RNA Consider him cured if still (-) Will call him with results.  Needs Hep B #3 in November.

## 2024-01-29 NOTE — Assessment & Plan Note (Signed)
>>  ASSESSMENT AND PLAN FOR UNCONTROLLED TYPE 2 DIABETES MELLITUS WITH HYPERGLYCEMIA, WITHOUT LONG-TERM CURRENT USE OF INSULIN  (HCC) WRITTEN ON 01/29/2024  1:40 PM BY HATCHER, JEFFREY C, MD  He'll f/u with Drs Elicia and Kandis

## 2024-01-29 NOTE — Progress Notes (Signed)
   Subjective:    Patient ID: Jared Robynn Raddle., male  DOB: April 06, 1956, 68 y.o.        MRN: [id number]   HPI 68 yo M with hx of metastatic ca of lung (brain), DM2 on metformin , and newly discovered Hep C (vl 1.8 million 09-10-23).  He is Hep A immune, Hep B negative.  His LFTs are normal. INR normal 2021.  His genotype was 1a.  He was stared on epclusa  (he is unsure he long he took it or when he finished it).  He states he feels well aside from his GSG (up to 200 earlier, now 136 on his CGM).  He is Hep A immune and Hep B completed 2/3 vax (3rd due 03-2024)  His Fib4 is 2.   He asks what the sx of hepatitis are and I explained abd swelling, leg swelling/edema, nose bleeds, juandice. He denies these emphatically.   Health Maintenance  Topic Date Due   DTaP/Tdap/Td (1 - Tdap) Never done   Zoster Vaccines- Shingrix (1 of 2) Never done   COVID-19 Vaccine (3 - Pfizer risk series) 10/08/2019   Influenza Vaccine  12/13/2023   OPHTHALMOLOGY EXAM  03/12/2024   Medicare Annual Wellness (AWV)  04/23/2024   HEMOGLOBIN A1C  06/29/2024   Diabetic kidney evaluation - eGFR measurement  12/30/2024   Diabetic kidney evaluation - Urine ACR  01/21/2025   Pneumococcal Vaccine: 50+ Years  Completed   Hepatitis C Screening  Completed   HPV VACCINES  Aged Out   Meningococcal B Vaccine  Aged Out   Hepatitis B Vaccines 19-59 Average Risk  Discontinued   FOOT EXAM  Discontinued   Colonoscopy  Discontinued      Review of Systems  Constitutional:  Negative for weight loss.  HENT:  Negative for nosebleeds.   Respiratory:  Negative for cough and shortness of breath.   Gastrointestinal:  Positive for constipation. Negative for diarrhea.  Genitourinary:  Negative for dysuria.    Please see HPI. All other systems reviewed and negative.     Objective:  Physical Exam Vitals reviewed.  Constitutional:      Appearance: He is normal weight.  HENT:     Mouth/Throat:     Mouth: Mucous membranes are  dry.  Cardiovascular:     Rate and Rhythm: Normal rate and regular rhythm.  Pulmonary:     Effort: Pulmonary effort is normal.     Breath sounds: Normal breath sounds.  Abdominal:     General: Bowel sounds are normal. There is no distension.     Palpations: Abdomen is soft.     Tenderness: There is no abdominal tenderness.  Musculoskeletal:     Cervical back: Normal range of motion and neck supple.     Right lower leg: No edema.     Left lower leg: No edema.  Skin:    Coloration: Skin is not jaundiced.  Neurological:     General: No focal deficit present.     Mental Status: He is alert.  Psychiatric:        Mood and Affect: Mood normal.           Assessment & Plan:

## 2024-01-30 LAB — HCV RNA QUANT

## 2024-01-31 LAB — HCV RNA QUANT

## 2024-01-31 NOTE — Addendum Note (Signed)
 Addended by: ANTONE DWAYNE SAILOR on: 01/31/2024 10:49 AM   Modules accepted: Orders

## 2024-02-03 ENCOUNTER — Encounter: Payer: Self-pay | Admitting: Student

## 2024-02-03 ENCOUNTER — Ambulatory Visit (INDEPENDENT_AMBULATORY_CARE_PROVIDER_SITE_OTHER): Admitting: Student

## 2024-02-03 ENCOUNTER — Telehealth: Payer: Self-pay | Admitting: *Deleted

## 2024-02-03 ENCOUNTER — Other Ambulatory Visit: Payer: Self-pay

## 2024-02-03 VITALS — BP 115/63 | HR 89 | Temp 98.2°F | Ht 72.0 in | Wt 180.8 lb

## 2024-02-03 DIAGNOSIS — E119 Type 2 diabetes mellitus without complications: Secondary | ICD-10-CM

## 2024-02-03 DIAGNOSIS — Z794 Long term (current) use of insulin: Secondary | ICD-10-CM | POA: Diagnosis not present

## 2024-02-03 DIAGNOSIS — Z7984 Long term (current) use of oral hypoglycemic drugs: Secondary | ICD-10-CM | POA: Diagnosis not present

## 2024-02-03 DIAGNOSIS — Z23 Encounter for immunization: Secondary | ICD-10-CM | POA: Diagnosis not present

## 2024-02-03 DIAGNOSIS — I10 Essential (primary) hypertension: Secondary | ICD-10-CM | POA: Diagnosis not present

## 2024-02-03 NOTE — Assessment & Plan Note (Signed)
 Presents as a walk-in after picking up metoprolol  from the pharmacy.  Wanted know if he should be taking this medication and reports that he has not been taking it recently.  He does have some memory loss issues and has a history of metastatic cancer to the brain with resection.  He also has a follow-up with our geriatrics clinic next week to address this.  His vitals today show a normal blood pressure at 115/63 and a heart rate in the high 80s.  Due to his history of heart failure and high PVC burden I do recommend that he continues to take his metoprolol .  However I do not know if he is actually not taking this medicine or was remembering incorrectly. - Resume/continue metoprolol  succinate 12.5 mg daily - Continue losartan /hydrochlorothiazide  50/12.5 mg daily and Imdur  60 mg daily

## 2024-02-03 NOTE — Patient Instructions (Signed)
 Thank you, Mr.Adedamola Robynn Raddle., for allowing us  to provide your care today. Today we discussed . . .  > Blood pressure       - Blood pressure continues to look great and I do recommend that you take the metoprolol  medication as prescribed.  This medication does not only help with your blood pressure but also helps with your heart failure and your history of premature ventricular complexes which are extra heartbeats.  Please watch out for dizziness, severe fatigue, or palpitations as these could be side effects of this medication. > Diabetes       - I would like you to continue taking Lantus  15 units daily and your metformin .  The goal for your metformin  is for you to take 1000 mg twice daily.  If you are not taking this dose already slowly increase to this dose.  If you are taking 500 mg daily start with 500 mg twice daily then 1000 mg in the morning and 500 mg at night before changing all the way to 1000 mg twice daily.  You can do this increase every 3 to 4 days.  Please let us  know if you have any low blood sugars and we can talk about decreasing your Lantus  from 15 units daily to 10 units daily but please do not stop the Lantus  without talking to our office first.  Follow up: On 02/11/2024 at 8:45 AM with Dr. Trudy after an 8:30 AM appointment with our diabetes coordinator.  Please arrive by 8:15 a.m.   Remember:  Should you have any questions or concerns please call the internal medicine clinic at [phone].     Fairy Pool, DO [facility]

## 2024-02-03 NOTE — Assessment & Plan Note (Signed)
>>  ASSESSMENT AND PLAN FOR UNCONTROLLED TYPE 2 DIABETES MELLITUS WITH HYPERGLYCEMIA, WITHOUT LONG-TERM CURRENT USE OF INSULIN  (HCC) WRITTEN ON 02/03/2024  2:33 PM BY GOODWIN, JOSEPH, DO  Patient recently hospitalized with DKA and A1c that time was 13.1.  Followed up in our clinic after this hospitalization on 01/22/2024.  At that time he was having low blood sugars on his current insulin  regimen of long-acting and mealtime insulin  along with metformin  so he was changed to Lantus  15 units daily and metformin  with the idea of titrating up to 2000 mg daily.  He is unsure if he is taking that dose of metformin  but he is taking Lantus  15 units daily.  He has his Dexcom CGM with him which I independently interpreted and over the last 14 days he has 1% low/very low and 90+ percent within goal with 1% very high.  Currently we believe that he is insulin  deficient due to a side effect of past immunotherapy so he will stay on insulin  for now unless he is having recurrent significant lows despite medication decreases.  I recommend that he continue his current regimen and work on increasing to metformin  1000 mg twice daily. - Continue Lantus  15 units daily and continue to increase metformin  to 1000 mg twice daily

## 2024-02-03 NOTE — Assessment & Plan Note (Signed)
 Patient recently hospitalized with DKA and A1c that time was 13.1.  Followed up in our clinic after this hospitalization on 01/22/2024.  At that time he was having low blood sugars on his current insulin  regimen of long-acting and mealtime insulin  along with metformin  so he was changed to Lantus  15 units daily and metformin  with the idea of titrating up to 2000 mg daily.  He is unsure if he is taking that dose of metformin  but he is taking Lantus  15 units daily.  He has his Dexcom CGM with him which I independently interpreted and over the last 14 days he has 1% low/very low and 90+ percent within goal with 1% very high.  Currently we believe that he is insulin  deficient due to a side effect of past immunotherapy so he will stay on insulin  for now unless he is having recurrent significant lows despite medication decreases.  I recommend that he continue his current regimen and work on increasing to metformin  1000 mg twice daily. - Continue Lantus  15 units daily and continue to increase metformin  to 1000 mg twice daily

## 2024-02-03 NOTE — Telephone Encounter (Signed)
 Pt's here at the office for question about a medication. He has Metoprolol  with him; stated he has not been taking it. He asked if his BP was good the last time he was here;on 9/17 BP was 112/66; does he needs to continue.  He's unable to check his BP at home. I told him he needs an appt to talk to the doctor. He will see Dr Jolaine @ 1415PM today.

## 2024-02-03 NOTE — Progress Notes (Signed)
 CC: Walk-in visit for hypertension, last visit 01/22/2024  HPI:  Jared Mcguiness. is a 68 y.o. male with pertinent PMH of T2DM, HFmrEF, CAD, HTN, HLD, seizure disorder, insomnia, and small cell lung cancer with brain metastasis s/p tumor resection, chemotherapy, radiation, and immunotherapy who presents as above. Please see assessment and plan below for further details.  Medications: Current Outpatient Medications  Medication Instructions   aspirin  EC 81 mg, Oral, Daily, Swallow whole.   atorvastatin  (LIPITOR) 20 mg, Oral, Daily   benzocaine  (ORAJEL) 10 % mucosal gel 1 Application, Mouth/Throat, As needed   Blood Glucose Monitoring Suppl (BLOOD GLUCOSE MONITOR SYSTEM) w/Device KIT Use as directed to check blood sugar three times daily   Continuous Glucose Sensor (DEXCOM G7 SENSOR) MISC Place new sensor every 10 days. Use to monitor blood sugar continuously.   glucose blood (ACCU-CHEK GUIDE TEST) test strip USE A NEW STRIP EACH TIME TO CHECK YOUR BLOOD SUGAR.   insulin  glargine (LANTUS ) 15 Units, Subcutaneous, Daily   Insulin  Pen Needle (PEN NEEDLES) 31G X 5 MM MISC Use to inject insulin  three times daily as directed   isosorbide  mononitrate (IMDUR ) 60 mg, Oral, Daily   Lancet Device MISC 1 each, Does not apply, 3 times daily, May dispense any manufacturer covered by patient's insurance.   Lancets MISC Use to check blood sugar three times daily as directed   levETIRAcetam  (KEPPRA ) 750 MG tablet TAKE 1 TABLET(750 MG) BY MOUTH TWICE DAILY   losartan -hydrochlorothiazide  (HYZAAR) 50-12.5 MG tablet 1 tablet, Oral, Daily   melatonin 5 mg, Oral, At bedtime PRN   metFORMIN  (GLUCOPHAGE ) 500 MG tablet TAKE 1 TABLET(500 MG) BY MOUTH TWICE DAILY WITH A MEAL   metoprolol  succinate (TOPROL  XL) 12.5 mg, Oral, Nightly   Sofosbuvir -Velpatasvir  (EPCLUSA ) 400-100 MG TABS 1 tablet, Daily     Review of Systems:   Pertinent items noted in HPI and/or A&P.  Physical Exam:  Vitals:   02/03/24 1355  BP:  115/63  Pulse: 89  Temp: 98.2 F (36.8 C)  TempSrc: Oral  SpO2: 98%  Weight: 180 lb 12.8 oz (82 kg)  Height: 6' (1.829 m)    Constitutional: Well-appearing elderly male. In no acute distress. HEENT: Normocephalic, atraumatic, Sclera non-icteric, PERRL, EOM intact Cardio:Regular rate and rhythm. Pulm:Normal work of breathing on room air. Neuro:Alert and oriented x3. No focal deficit noted. Psych:Pleasant mood and affect.   Assessment & Plan:   Essential hypertension Presents as a walk-in after picking up metoprolol  from the pharmacy.  Wanted know if he should be taking this medication and reports that he has not been taking it recently.  He does have some memory loss issues and has a history of metastatic cancer to the brain with resection.  He also has a follow-up with our geriatrics clinic next week to address this.  His vitals today show a normal blood pressure at 115/63 and a heart rate in the high 80s.  Due to his history of heart failure and high PVC burden I do recommend that he continues to take his metoprolol .  However I do not know if he is actually not taking this medicine or was remembering incorrectly. - Resume/continue metoprolol  succinate 12.5 mg daily - Continue losartan /hydrochlorothiazide  50/12.5 mg daily and Imdur  60 mg daily  Diabetes mellitus (HCC) Patient recently hospitalized with DKA and A1c that time was 13.1.  Followed up in our clinic after this hospitalization on 01/22/2024.  At that time he was having low blood sugars on his current insulin   regimen of long-acting and mealtime insulin  along with metformin  so he was changed to Lantus  15 units daily and metformin  with the idea of titrating up to 2000 mg daily.  He is unsure if he is taking that dose of metformin  but he is taking Lantus  15 units daily.  He has his Dexcom CGM with him which I independently interpreted and over the last 14 days he has 1% low/very low and 90+ percent within goal with 1% very high.   Currently we believe that he is insulin  deficient due to a side effect of past immunotherapy so he will stay on insulin  for now unless he is having recurrent significant lows despite medication decreases.  I recommend that he continue his current regimen and work on increasing to metformin  1000 mg twice daily. - Continue Lantus  15 units daily and continue to increase metformin  to 1000 mg twice daily    Patient discussed with Dr. Mliss Foot  Fairy Pool, DO [facility] Internal Medicine Resident PGY-2 Clinic Phone: [phone] Please contact the on call pager at [phone] for any urgent or emergent needs.

## 2024-02-04 NOTE — Progress Notes (Signed)
 Internal Medicine Clinic Attending  Case discussed with the resident at the time of the visit.  We reviewed the resident's history and exam and pertinent patient test results.  I agree with the assessment, diagnosis, and plan of care documented in the resident's note.

## 2024-02-10 ENCOUNTER — Encounter: Payer: Self-pay | Admitting: Internal Medicine

## 2024-02-10 DIAGNOSIS — Z8589 Personal history of malignant neoplasm of other organs and systems: Secondary | ICD-10-CM | POA: Insufficient documentation

## 2024-02-10 DIAGNOSIS — E119 Type 2 diabetes mellitus without complications: Secondary | ICD-10-CM | POA: Insufficient documentation

## 2024-02-10 NOTE — Assessment & Plan Note (Signed)
 Per chart review, the biopsy was not completed due to loss to f/u during scheduling attempts in 2021.  Will forward this to his PCP.

## 2024-02-10 NOTE — Progress Notes (Signed)
 Comprehensive Geriatric Assessment - Initial Consultation   PCP: Elicia Sharper, DO  Referring Provider: Dr. Damien Lease Reason for Consultation: Cognitive impairment, difficulty with medication adherence, clarification of cognitive diagnosis, recommendations for management  Jared Shea is unaccompanied today.  Jared Shea is a 68 yo man followed in the [facility] for his chronic medical concerns, the most impactful of which was the diagnosis and treatment of NSCCL metastatic to brain in 2021, which is in remission under surveillance.  Jared Shea is also being treated for chronic hep C identified on routine screening Summer 2025 (under tx by ID Dr. Eben) and has completed 2 of 3 of the Hep B vaccine series to address non-immunity. He has previously well controlled T2DM (first diagnosed in 2021 after steroid therapy, perhaps steroid induced/) until hospitalization 12/2023 when he presented with first episode DKA when insulin  was first initiated; A1c had abruptly risen to 13.  He was noted to have lost about 20# unintentionally over this past Summer but has begun to gain weight back.  He is treated for HTN and in the past few years was found to have CAD on non-invasive testing and has experienced recovery of a mildly reduced EF upon most recent echo 12/2023.  He experiences frequent palpitations and has used metoprolol  variably from prn low dose to higher scheduled dosing over time.    Cognitive history: Jared Shea began experiencing cognitive changes, headaches, and seizures which led to dx of metastatic cancer to brain found to be NSCCL primary.  He underwent R frontal lobe tumor resection in 12/2019, chemo, radiation, and immunotherapy.  Recurrences in other more posterior brain locations were subsequently treated non-surgically.  Since that time he has had poor memory and difficulty with managing tasks.  He was evaluated by neurologist Dr. Diona in 2022 at which time MoCA score 13/30 and B12 level noted to be nml.   At recent [facility] visit a miniCog was grossly abnormal.    Jared Shea is able to tell me that he noticed problems with thinking and memory following brain surgery and cancer treatment, specifically that he has difficulty with s hort term m mory things go in one ear and out the other - he must concentrate in order to be able to remember, and still has difficulty.  He doesn't use written reminders.  Feels like this has gotten worse over time but can't provide specifics.  Doesn't feel like it interferes with things he wants to do.  Feels better since leaving the hospital.  No difficulty with verbal conversation or word finding.    He was noted during the recent DKA hospitalization in 12/2023 to have acute confusion, and delirium vs dementia was noted.  Jared Shea can tell me he remembers feeling sick, and while in [facility] ED (no one was checking on me) he became confused, didn't know where he was, ripped the things out of my arms, and eloped.  He returned home, and the next day presented to [facility] where he was admitted. They tell me I did and said some crazy things, I don't have any idea. Feeling much better since then, with no periods of acute confusion. His son had reported that his dad was back to normal/baseline at Jared Shea's hospital f/u visit in the [facility]  Mother died around age 80-70 and had dementia. No siblings with cognitive impairment.    Patient Active Problem List   Diagnosis Date Noted   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    CAD (  coronary artery disease) 12/29/2023   History of diabetic ketoacidosis 12/28/2023   Chronic hepatitis C without hepatic coma (HCC) 09/12/2023   Hyperlipidemia 02/19/2023   Sleep disorder, circadian 08/20/2022   Heart failure with recovered ejection fraction (HFrecEF) (HCC) 08/20/2022   PVC (premature ventricular contraction) 05/21/2022   Bilateral primary osteoarthritis of knee 12/06/2021   Erectile dysfunction 07/11/2021   Cognitive impairment  03/14/2021   Port-A-Cath in place 07/05/2020   Uncontrolled type 2 diabetes mellitus with hyperglycemia, without long-term current use of insulin  (HCC) 01/06/2020   Adjustment disorder with depressed mood 01/06/2020   Thyroid  nodule 01/04/2020   History of primary non-small cell carcinoma of left lung 12/31/2019   Complex partial seizures (HCC) - first manifestation of brain mets 2021 12/09/2019   Hypertension associated with type 2 diabetes mellitus (HCC) 07/28/2018    Current Outpatient Medications:    aspirin  EC 81 MG tablet, Take 1 tablet (81 mg total) by mouth daily. Swallow whole., Disp: , Rfl:    atorvastatin  (LIPITOR) 20 MG tablet, Take 1 tablet (20 mg total) by mouth daily., Disp: 90 tablet, Rfl: 3   benzocaine  (ORAJEL) 10 % mucosal gel, Use as directed 1 Application in the mouth or throat as needed for mouth pain., Disp: 5.3 g, Rfl: 0   Blood Glucose Monitoring Suppl (BLOOD GLUCOSE MONITOR SYSTEM) w/Device KIT, Use as directed to check blood sugar three times daily, Disp: 1 kit, Rfl: 0   Continuous Glucose Sensor (DEXCOM G7 SENSOR) MISC, Place new sensor every 10 days. Use to monitor blood sugar continuously., Disp: 3 each, Rfl: 3   glucose blood (ACCU-CHEK GUIDE TEST) test strip, USE A NEW STRIP EACH TIME TO CHECK YOUR BLOOD SUGAR., Disp: 200 strip, Rfl: 10   insulin  glargine (LANTUS ) 100 UNIT/ML Solostar Pen, Inject 15 Units into the skin daily., Disp: 15 mL, Rfl: 0   Insulin  Pen Needle (PEN NEEDLES) 31G X 5 MM MISC, Use to inject insulin  three times daily as directed, Disp: 100 each, Rfl: 3   isosorbide  mononitrate (IMDUR ) 60 MG 24 hr tablet, Take 60 mg by mouth daily., Disp: , Rfl:    Lancet Device MISC, 1 each by Does not apply route 3 (three) times daily. May dispense any manufacturer covered by patient's insurance., Disp: 1 each, Rfl: 0   Lancets MISC, Use to check blood sugar three times daily as directed, Disp: 100 each, Rfl: 0   levETIRAcetam  (KEPPRA ) 750 MG tablet, TAKE 1  TABLET(750 MG) BY MOUTH TWICE DAILY, Disp: 180 tablet, Rfl: 4   losartan -hydrochlorothiazide  (HYZAAR) 50-12.5 MG tablet, Take 1 tablet by mouth daily., Disp: 90 tablet, Rfl: 3   metFORMIN  (GLUCOPHAGE ) 500 MG tablet, TAKE 1 TABLET(500 MG) BY MOUTH TWICE DAILY WITH A MEAL, Disp: 120 tablet, Rfl: 3   metoprolol  succinate (TOPROL  XL) 25 MG 24 hr tablet, Take 0.5 tablets (12.5 mg total) by mouth at bedtime., Disp: 90 tablet, Rfl: 3   Sofosbuvir -Velpatasvir  (EPCLUSA ) 400-100 MG TABS, Take 1 tablet by mouth daily., Disp: , Rfl:   Meds reviewed: All accounted for except Imdur  and atorvastatin  I might have left them at home, or maybe they took me off'. Is taking metoprolol  in the morning.  Social: Son (who works nights, sleeps days) lives with him.  Sister is near and helps with meds.  Rides motorcycle for fun when it's not raining.  Doesn't go far, and in that radius doesn't get lost. He recognizes that his sense of direction and ability to find his way around town  has declined since the cancer treatment.  Used to be a [employer] architect, had retired prior to his cancer diagnosis.  Has a younger brother, they get together some.    ADLs/IADLs: He endorses no difficulty with any of the daily or instrumental functions.  No problems with using the microwave, dressing sequence, etc.  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 Sister fills a weekly pill sorter.    Geriatric ROS Appetite/Weight: Had lost 20# over summer 2025 but gaining back since August hospitalization. Endorses good apptite. Elimination: No difficulty or concerns with bowel or bladder fxn. Mobility & Falls: No difficulty with balance or fear of falling, unless something's going on with my sugar.   Sensory: No problems with hearing.  Vision - gets bleary when sugar is too high.  Just saw an eye doctor and got new glasses. Pain: None endorsed Sleep: No problems Mood: I'm ok, endorses no sadness or  worry  Objective BP 114/66 (BP Location: Right Arm, Patient Position: Sitting)   Pulse 82   Temp 97.8 F (36.6 C) (Oral)   Ht 6' (1.829 m)   Wt 179 lb 9.6 oz (81.5 kg)   SpO2 98%   BMI 24.36 kg/m   Awake, alert, comfortable, calm and casual demeanor, engaged with good focus, doesn't sustain eye contact, occasionally fidgety.  Moves with good balance. Slender habitus.  Cognitive Assessment:     02/11/2024    4:09 PM 03/14/2021   11:00 AM  Montreal Cognitive Assessment   Visuospatial/ Executive (0/5) 2 2  Naming (0/3) 1 3  Attention: Read list of digits (0/2) 2 2  Attention: Read list of letters (0/1) 1 1  Attention: Serial 7 subtraction starting at 100 (0/3) 0 0  Language: Repeat phrase (0/2) 1 1  Language : Fluency (0/1) 0 0  Abstraction (0/2) 0 1  Delayed Recall (0/5) 3 0  Orientation (0/6) 5 3  Total 15 13  Adjusted Score (based on education) 16     Last thyroid  functions Lab Results  Component Value Date   TSH 0.988 12/31/2023   T4TOTAL 11.2 04/10/2022   Brain MRI 12/2023 (to evaluate encephalopathy/delirium) IMPRESSION: 1.  No evidence of an acute intracranial abnormality. 2. Prior right parietal craniotomy. Small underlying resection cavity/biopsy site within the within the posterior right frontal lobe (with surrounding gliosis). This is unchanged in non-contrast appearance as compared to the prior MRI of 05/16/2023. 3. Unchanged chronic lacunar infarcts within the right caudate nucleus and left periatrial white matter. 4. Mild background cerebral white matter chronic small vessel ischemic disease. 5. Mild generalized cerebral atrophy.    Electronically Signed   By: Rockey Childs D.O.   On: 12/31/2023 11:30  Assessment and Plan:  Cognitive impairment Assessment & Plan: Jared Shea experienced acute decline in cognitive function in 2021 at age 73 following treatment for NSCCL metastatic to brain.  He identifies problems with short term memory and sustaining  concentration as the most challenging.  MoCA screening today indicates stable metrics from 2022 when last assessed.  Multidomain impairment noted, including executive, language, and abstraction.  Suprisingly he did well with two tasks of attention.  Notable was need to practice 5 memory items 5 times before registering, though he was able to recall 3 of the 5 in delayed recall.  Given the sudden onset and lack of progression over time, and in absence of clear evidence of Alzheimer's type changes on imaging (no hippocampal atrophy), and having ruled out  pseudodementia, thyroid  disorder and B12 deficiency, Jared Shea diagnosis is post craniectomy-associated multidomain cognitive dysfunction.  He does have evidence of small vessel disease and very small subcortical strokes which have been stable over the years. Given his maintained daily function (per his report - have not yet corroborated), he does not have dementia. This is not to say that he may not yet develop it in the future, as from possible progression of vascular disease/strokes or development of AD-type decline.  He is at risk for delirium with future illnesses and hospitalization given his reduced cognitive reserve.  In absence of degenerative condition, no memory medicines would be indicated.  His mood is stable. Most important for his well-being is safety, including medication management and driving.  While I did not discuss this with him today, it would be prudent for him to be assessed for driving safety through the [facility].  Cognitive remediation was discussed with him today (OT referral) and he wishes to think about it before making a decision.   Acute hepatitis C virus infection without hepatic coma -     HCV RNA quant (intended for completion at a recent visit; added on today)  Complex partial seizures (HCC) - first manifestation of brain mets 2021 Assessment & Plan: No reported seizures since his isolated episodes just prior to metastatic (to  brain) cancer diagnosis. On Keppra .   PVC (premature ventricular contraction) Assessment & Plan: Currently asymptomatic.  Taking metoprolol  in the morning (prescribed at night) with no adverse effects.  I will make the change to correspond to his preference.  Heart failure with recovered ejection fraction (HFrecEF) [facility]) Assessment & Plan: Euvolemic, asymptomatic on current regimen.  Thyroid  nodule Assessment & Plan: Per chart review, the biopsy was not completed due to loss to f/u during scheduling attempts in 2021.  Will forward this to his PCP.  Uncontrolled type 2 diabetes mellitus with hyperglycemia, with long-term current use of insulin  [facility]) Assessment & Plan: The patient is currently using Continuous Glucose Monitoring. The patient is injecting insulin  one time a day. The patient is making adjustments to their diabetes regimen based on glucose readings. 92% of readings within range with no lows.  Estimated A1c would be 6.7, next due 03/2024.  Currently on  metformin  and insulin  glargine 15 units.  New monitor placed by Ms. Plyler today.  At risk for delirium       No follow-ups on file.

## 2024-02-11 ENCOUNTER — Ambulatory Visit (INDEPENDENT_AMBULATORY_CARE_PROVIDER_SITE_OTHER): Admitting: Dietician

## 2024-02-11 ENCOUNTER — Ambulatory Visit: Payer: Self-pay | Admitting: Dietician

## 2024-02-11 ENCOUNTER — Ambulatory Visit (INDEPENDENT_AMBULATORY_CARE_PROVIDER_SITE_OTHER): Admitting: Internal Medicine

## 2024-02-11 VITALS — Wt 179.6 lb

## 2024-02-11 VITALS — BP 114/66 | HR 82 | Temp 97.8°F | Ht 72.0 in | Wt 179.6 lb

## 2024-02-11 DIAGNOSIS — E1165 Type 2 diabetes mellitus with hyperglycemia: Secondary | ICD-10-CM

## 2024-02-11 DIAGNOSIS — B1710 Acute hepatitis C without hepatic coma: Secondary | ICD-10-CM | POA: Diagnosis not present

## 2024-02-11 DIAGNOSIS — Z87891 Personal history of nicotine dependence: Secondary | ICD-10-CM

## 2024-02-11 DIAGNOSIS — I5032 Chronic diastolic (congestive) heart failure: Secondary | ICD-10-CM | POA: Diagnosis not present

## 2024-02-11 DIAGNOSIS — Z79899 Other long term (current) drug therapy: Secondary | ICD-10-CM

## 2024-02-11 DIAGNOSIS — E041 Nontoxic single thyroid nodule: Secondary | ICD-10-CM | POA: Diagnosis not present

## 2024-02-11 DIAGNOSIS — Z9189 Other specified personal risk factors, not elsewhere classified: Secondary | ICD-10-CM

## 2024-02-11 DIAGNOSIS — Z85841 Personal history of malignant neoplasm of brain: Secondary | ICD-10-CM

## 2024-02-11 DIAGNOSIS — Z85118 Personal history of other malignant neoplasm of bronchus and lung: Secondary | ICD-10-CM

## 2024-02-11 DIAGNOSIS — Z8249 Family history of ischemic heart disease and other diseases of the circulatory system: Secondary | ICD-10-CM

## 2024-02-11 DIAGNOSIS — R4189 Other symptoms and signs involving cognitive functions and awareness: Secondary | ICD-10-CM | POA: Diagnosis not present

## 2024-02-11 DIAGNOSIS — Z794 Long term (current) use of insulin: Secondary | ICD-10-CM

## 2024-02-11 DIAGNOSIS — I493 Ventricular premature depolarization: Secondary | ICD-10-CM | POA: Diagnosis not present

## 2024-02-11 DIAGNOSIS — Z833 Family history of diabetes mellitus: Secondary | ICD-10-CM

## 2024-02-11 DIAGNOSIS — G40209 Localization-related (focal) (partial) symptomatic epilepsy and epileptic syndromes with complex partial seizures, not intractable, without status epilepticus: Secondary | ICD-10-CM | POA: Diagnosis not present

## 2024-02-11 DIAGNOSIS — I5020 Unspecified systolic (congestive) heart failure: Secondary | ICD-10-CM

## 2024-02-11 NOTE — Assessment & Plan Note (Signed)
 Jared Shea experienced acute decline in cognitive function in 2021 at age 68 following treatment for NSCCL metastatic to brain.  He identifies problems with short term memory and sustaining concentration as the most challenging.  MoCA screening today indicates stable metrics from 2022 when last assessed.  Multidomain impairment noted, including executive, language, and abstraction.  Suprisingly he did well with two tasks of attention.  Notable was need to practice 5 memory items 5 times before registering, though he was able to recall 3 of the 5 in delayed recall.  Given the sudden onset and lack of progression over time, and in absence of clear evidence of Alzheimer's type changes on imaging (no hippocampal atrophy), and having ruled out pseudodementia, thyroid  disorder and B12 deficiency, Jared Shea diagnosis is post craniectomy-associated multidomain cognitive dysfunction.  He does have evidence of small vessel disease and very small subcortical strokes which have been stable over the years. Given his maintained daily function (per his report - have not yet corroborated), he does not have dementia. This is not to say that he may not yet develop it in the future, as from possible progression of vascular disease/strokes or development of AD-type decline.  He is at risk for delirium with future illnesses and hospitalization given his reduced cognitive reserve.  In absence of degenerative condition, no memory medicines would be indicated.  His mood is stable. Most important for his well-being is safety, including medication management and driving.  While I did not discuss this with him today, it would be prudent for him to be assessed for driving safety through the [facility].  Cognitive remediation was discussed with him today (OT referral) and he wishes to think about it before making a decision.

## 2024-02-11 NOTE — Patient Instructions (Signed)
 Jared Shea,  Thank you for coming in today to talk about how your thinking and memory are doing.  We did a screening test today that showed that your score is a couple of points better than it was when last done in 2022.  You are having most difficulty with concentration and short term memory.  This doesn't look like dementia. It is by far most likely due to your brain surgery and radiation and chemo for the cancer in 2021.  There are some therapists who can work with you on your thinking and memory, and you said you wanted to think about it for now.  I'm glad that your blood sugar is doing so much better!  Keep taking good care of yourself and stay safe.  Please let us  know if you have a sudden change in your thinking and memory that is different from your usual self.    You have a higher chance of having sudden confusion when you get sick.  This is called delirium, which usually gets better once the sickness goes away.  This is what happened during your August hospitalization.  Take care and stay well!  Dr. Trudy

## 2024-02-11 NOTE — Progress Notes (Addendum)
 Diabetes Self-Management Education  Visit Type: Annual Follow-Up  Appt. Start Time: 840 Appt. End Time: 910  02/11/2024  [name], identified by name and date of birth, is a 68 y.o. male with a diagnosis of Diabetes:  .Type 2   ASSESSMENT  Weight 179 lb 9.6 oz (81.5 kg). Body mass index is 24.36 kg/m.  Weight is appropriate.  A1c due 03/29/24.anticipate it will be lower. Today receiver shows blood sugar 233 and stable. Patient states he is fasting but 233 is the high of what appears to be an after meal spike. His doctor states he may not recall eating this am due to memory issues. He is doing well with Continuous glucose monitoring     Diabetes Self-Management Education - 02/11/24 0900       Visit Information   Visit Type Annual Follow-Up      Health Coping   How would you rate your overall health? Good      Complications   How often do you check your blood sugar? > 4 times/day    Fasting Blood glucose range (mg/dL) 29-870    Postprandial Blood glucose range (mg/dL) 869-820;819-799;>799    Number of hypoglycemic episodes per month 0    Number of hyperglycemic episodes ( >200mg /dL): Weekly    Have you had a dental exam in the past 12 months? No   we discussed calling [employer] to find a denitst in network     Dietary Intake   Breakfast 4 eggs and pork chop with grqavy from [employer], water    Lunch skips or leftovers    Dinner brocoli and cauliflower with meat, water    Beverage(s) water or diet soda      Activity / Exercise   Activity / Exercise Type Light (walking / raking leaves);ADL's    How many days per week do you exercise? 5    How many minutes per day do you exercise? 30    Total minutes per week of exercise 150      Patient Education   Previous Diabetes Education Yes      Individualized Goals (developed by patient)   Monitoring  Consistenly use CGM      Patient Self-Evaluation of Goals - Patient rates self as meeting previously set goals (% of  time)   Monitoring >75% (most of the time)   he is doing well, states son puts sensor on and he looks at it throughout the day and states he wants it in teh low 100s.     Post-Education Assessment   Patient understands incorporating nutritional management into lifestyle. Comprehends key points   having memory issues, states he has not eaten today but blood sugar shows a spike like he has eaten   Patient understands monitoring blood glucose, interpreting and using results Comprehends key points    Patient understands prevention, detection, and treatment of acute complications. Comprehends key points      Outcomes   Expected Outcomes Demonstrated interest in learning but significant barriers to change;Demonstrated interest in learning. Expect positive outcomes    Future DMSE 6 months    Program Status Completed      Subsequent Visit   Since your last visit have you continued or begun to take your medications as prescribed? Yes    Since your last visit have you had your blood pressure checked? Yes    Is your most recent blood pressure lower, unchanged, or higher since your last visit? Unchanged   well controlled  Since your last visit have you experienced any weight changes? No change    Since your last visit, are you checking your blood glucose at least once a day? Yes   cgm reports shows excellent blood sugar with only spikes after breakfast. son is not with him today- he works at night and sleeps during the day         Individualized Plan for Diabetes Self-Management Training:   Learning Objective:  Patient will have a greater understanding of diabetes self-management. Patient education plan is to attend individual and/or group sessions per assessed needs and concerns.   Plan:   Patient Instructions  Thank you for your visit today!  Thank you for bringing your Continuous glucose monitor!   Your blood sugar look like they are doing well most of the time.  Work on lowering the  blood sugar after breakfast . You can do that by eating less fruit, starch (smaller portion of gravy maybe?) and by taking a walk after breakfast   Call [facility] about a dentist in network- I think [facility] on [address] takes [facility]- you can call them as well.   Let's follow up n 6 months- about march 2026.  Call anytime with diabetes questions.  [name] [phone] (new number)   Expected Outcomes:  Demonstrated interest in learning but significant barriers to change, Demonstrated interest in learning. Expect positive outcomes  Education material provided: Diabetes Resources  If problems or questions, patient to contact team via:  Phone and Email  Future DSME appointment: 6 months [name] Hole, RD 02/11/2024 9:26 AM.

## 2024-02-11 NOTE — Patient Instructions (Addendum)
 Thank you for your visit today!  Thank you for bringing your Continuous glucose monitor!   Your blood sugar look like they are doing well most of the time.  Work on lowering the blood sugar after breakfast . You can do that by eating less fruit, starch (smaller portion of gravy maybe?) and by taking a walk after breakfast   Call [facility] about a dentist in network- I think [facility] on [address] takes [facility]- you can call them as well.   Let's follow up n 6 months- about march 2026.  Call anytime with diabetes questions.  [name] [phone] (new number)

## 2024-02-12 ENCOUNTER — Encounter: Payer: Self-pay | Admitting: Internal Medicine

## 2024-02-12 DIAGNOSIS — Z9189 Other specified personal risk factors, not elsewhere classified: Secondary | ICD-10-CM | POA: Insufficient documentation

## 2024-02-12 LAB — HCV RNA QUANT: Hepatitis C Quantitation: NOT DETECTED [IU]/mL

## 2024-02-12 NOTE — Assessment & Plan Note (Signed)
>>  ASSESSMENT AND PLAN FOR UNCONTROLLED TYPE 2 DIABETES MELLITUS WITH HYPERGLYCEMIA, WITHOUT LONG-TERM CURRENT USE OF INSULIN  ([facility]) WRITTEN ON 02/12/2024  5:52 PM BY WILLIAMS, JULIE ANNE, MD  The patient is currently using Continuous Glucose Monitoring. The patient is injecting insulin  one time a day. The patient is making adjustments to their diabetes regimen based on glucose readings. 92% of readings within range with no lows.  Estimated A1c would be 6.7, next due 03/2024.  Currently on  metformin  and insulin  glargine 15 units.  New monitor placed by Ms. Plyler today.

## 2024-02-12 NOTE — Assessment & Plan Note (Signed)
 Euvolemic, asymptomatic on current regimen.

## 2024-02-12 NOTE — Assessment & Plan Note (Signed)
 Currently asymptomatic.  Taking metoprolol  in the morning (prescribed at night) with no adverse effects.  I will make the change to correspond to his preference.

## 2024-02-12 NOTE — Assessment & Plan Note (Signed)
 No reported seizures since his isolated episodes just prior to metastatic (to brain) cancer diagnosis. On Keppra .

## 2024-02-12 NOTE — Assessment & Plan Note (Signed)
 The patient is currently using Continuous Glucose Monitoring. The patient is injecting insulin  one time a day. The patient is making adjustments to their diabetes regimen based on glucose readings. 92% of readings within range with no lows.  Estimated A1c would be 6.7, next due 03/2024.  Currently on  metformin  and insulin  glargine 15 units.  New monitor placed by Ms. Plyler today.

## 2024-02-13 ENCOUNTER — Telehealth: Payer: Self-pay | Admitting: *Deleted

## 2024-02-13 ENCOUNTER — Ambulatory Visit (HOSPITAL_COMMUNITY)
Admission: RE | Admit: 2024-02-13 | Discharge: 2024-02-13 | Disposition: A | Discharge status: Home / Self Care | Source: Ambulatory Visit | Attending: Internal Medicine | Admitting: Internal Medicine

## 2024-02-13 ENCOUNTER — Inpatient Hospital Stay

## 2024-02-13 DIAGNOSIS — C7931 Secondary malignant neoplasm of brain: Secondary | ICD-10-CM

## 2024-02-13 NOTE — Telephone Encounter (Signed)
 PC to patient's sister, Cindia - patient did not come to his flush appointment today, has MRI at 5:00 today as well.  She said she thought we were supposed to call her back to let her know if he still needed the MRI today because he had MRI brain while in the hospital.  Informed Arlene Dr Buckley still needs this current MRI.  Central Scheduling number given to Arlene, she will reschedule MRI.

## 2024-02-17 ENCOUNTER — Inpatient Hospital Stay: Payer: Medicare HMO

## 2024-02-19 ENCOUNTER — Ambulatory Visit (HOSPITAL_COMMUNITY)
Admission: RE | Admit: 2024-02-19 | Discharge: 2024-02-19 | Disposition: A | Discharge status: Home / Self Care | Source: Ambulatory Visit | Attending: Internal Medicine | Admitting: Internal Medicine

## 2024-02-19 DIAGNOSIS — Z8673 Personal history of transient ischemic attack (TIA), and cerebral infarction without residual deficits: Secondary | ICD-10-CM | POA: Diagnosis not present

## 2024-02-19 DIAGNOSIS — I6782 Cerebral ischemia: Secondary | ICD-10-CM | POA: Diagnosis not present

## 2024-02-19 DIAGNOSIS — C801 Malignant (primary) neoplasm, unspecified: Secondary | ICD-10-CM | POA: Diagnosis not present

## 2024-02-19 DIAGNOSIS — C7931 Secondary malignant neoplasm of brain: Secondary | ICD-10-CM | POA: Insufficient documentation

## 2024-02-19 MED ORDER — GADOBUTROL 1 MMOL/ML IV SOLN
8.2000 mL | Freq: Once | INTRAVENOUS | Status: AC | PRN
  Administered 2024-02-19: 8.2 mL via INTRAVENOUS

## 2024-02-19 NOTE — Telephone Encounter (Signed)
 Called and left VM that pt's Hep C has been cured

## 2024-02-20 ENCOUNTER — Telehealth: Payer: Self-pay | Admitting: *Deleted

## 2024-02-20 ENCOUNTER — Inpatient Hospital Stay: Payer: Medicare HMO | Attending: Internal Medicine | Admitting: Internal Medicine

## 2024-02-20 VITALS — BP 121/56 | HR 89 | Temp 97.8°F | Resp 17 | Ht 72.0 in | Wt 181.0 lb

## 2024-02-20 DIAGNOSIS — Z9221 Personal history of antineoplastic chemotherapy: Secondary | ICD-10-CM | POA: Insufficient documentation

## 2024-02-20 DIAGNOSIS — M170 Bilateral primary osteoarthritis of knee: Secondary | ICD-10-CM | POA: Insufficient documentation

## 2024-02-20 DIAGNOSIS — Z794 Long term (current) use of insulin: Secondary | ICD-10-CM | POA: Diagnosis not present

## 2024-02-20 DIAGNOSIS — I2510 Atherosclerotic heart disease of native coronary artery without angina pectoris: Secondary | ICD-10-CM | POA: Diagnosis not present

## 2024-02-20 DIAGNOSIS — C3491 Malignant neoplasm of unspecified part of right bronchus or lung: Secondary | ICD-10-CM | POA: Insufficient documentation

## 2024-02-20 DIAGNOSIS — E785 Hyperlipidemia, unspecified: Secondary | ICD-10-CM | POA: Diagnosis not present

## 2024-02-20 DIAGNOSIS — Z8589 Personal history of malignant neoplasm of other organs and systems: Secondary | ICD-10-CM | POA: Diagnosis not present

## 2024-02-20 DIAGNOSIS — B182 Chronic viral hepatitis C: Secondary | ICD-10-CM | POA: Insufficient documentation

## 2024-02-20 DIAGNOSIS — Z87891 Personal history of nicotine dependence: Secondary | ICD-10-CM | POA: Diagnosis not present

## 2024-02-20 DIAGNOSIS — I5032 Chronic diastolic (congestive) heart failure: Secondary | ICD-10-CM | POA: Insufficient documentation

## 2024-02-20 DIAGNOSIS — Z7984 Long term (current) use of oral hypoglycemic drugs: Secondary | ICD-10-CM | POA: Diagnosis not present

## 2024-02-20 DIAGNOSIS — Z79899 Other long term (current) drug therapy: Secondary | ICD-10-CM | POA: Diagnosis not present

## 2024-02-20 DIAGNOSIS — G40209 Localization-related (focal) (partial) symptomatic epilepsy and epileptic syndromes with complex partial seizures, not intractable, without status epilepticus: Secondary | ICD-10-CM

## 2024-02-20 DIAGNOSIS — C7931 Secondary malignant neoplasm of brain: Secondary | ICD-10-CM | POA: Insufficient documentation

## 2024-02-20 DIAGNOSIS — Z7982 Long term (current) use of aspirin: Secondary | ICD-10-CM | POA: Diagnosis not present

## 2024-02-20 MED ORDER — LEVETIRACETAM 500 MG PO TABS: 500.0000 mg | ORAL_TABLET | Freq: Two times a day (BID) | ORAL | 11 refills | Status: AC

## 2024-02-20 NOTE — Telephone Encounter (Signed)
 Walk-in. 1410 PM -Stated his BS is low and he does not feel good; per his Dexcom BS is 73 then down to 70. I asked pt when was the last time he ate; stated it was breakfast and he thinks he had a hamburger. He did take his insulin  this morning.  He did not eat lunch. 1415PM- BS 73 and I gave pt 5 tabs of the True Plus Glucose tablets. 1425PM BS - 98; stated he feels better. 1440PM BS - 112. I informed pt when his BS's starts to drop, to eat and his dexcom will alarm . Stated he's not sure what to eat. We made him an appt w/Donna Plyler Wed 10/15.

## 2024-02-20 NOTE — Progress Notes (Signed)
 [facility] at [facility] [address]  [place], [place] [address] [phone]  Interval Evaluation  Date of Service: 02/20/24 Patient Name: Jared Shea. Patient MRN: [id number] Patient DOB: February 18, 1956 Provider: Arthea MARLA Manns, MD  Identifying Statement:  Jared Stiefel. is a 68 y.o. male with History of cancer metastatic to brain [Z85.89]   Primary Cancer:  Oncologic History: Oncology History  History of primary non-small cell carcinoma of left lung  12/31/2019 Initial Diagnosis   Non-small cell carcinoma of lung, stage 4 w/ mets to brain (HCC)   06/06/2020 Cancer Staging   Staging form: Lung, AJCC 8th Edition - Clinical: Stage IVB (cT0, cN2, cM1c) - Signed by Sherrod Sherrod, MD on 06/06/2020   Adenocarcinoma, metastatic (HCC) (Resolved)  01/07/2020 Initial Diagnosis   Adenocarcinoma, metastatic (HCC)   02/01/2020 - 03/07/2020 Chemotherapy   The patient had dexamethasone  (DECADRON ) 4 MG tablet, 8 mg, Oral, Daily, 1 of 1 cycle, Start date: --, End date: -- palonosetron  (ALOXI ) injection 0.25 mg, 0.25 mg, Intravenous,  Once, 5 of 7 cycles Administration: 0.25 mg (02/01/2020), 0.25 mg (02/08/2020), 0.25 mg (03/07/2020), 0.25 mg (02/15/2020), 0.25 mg (02/22/2020) CARBOplatin  (PARAPLATIN ) 220 mg in sodium chloride  0.9 % 250 mL chemo infusion, 220 mg (100 % of original dose 216.8 mg), Intravenous,  Once, 5 of 7 cycles Dose modification: 216.8 mg (original dose 216.8 mg, Cycle 1) Administration: 220 mg (02/01/2020), 220 mg (02/08/2020), 220 mg (03/07/2020), 220 mg (02/15/2020), 220 mg (02/22/2020) PACLitaxel  (TAXOL ) 90 mg in sodium chloride  0.9 % 250 mL chemo infusion (</= 80mg /m2), 45 mg/m2 = 90 mg, Intravenous,  Once, 5 of 7 cycles Administration: 90 mg (02/01/2020), 90 mg (02/08/2020), 90 mg (03/07/2020), 90 mg (02/15/2020), 90 mg (02/22/2020)  for chemotherapy treatment.    04/25/2020 - 04/10/2022 Chemotherapy   Patient is on Treatment Plan : LUNG NSCLC  Cemiplimab  q21d      CNS Oncologic History 01/05/20: Pre-op SRS [facility]) 01/07/20: Craniotomy, R frontal resection ([place]) 04/22/20: Salvage SRS to 3 targets   Interval History:  Jared Lal. presents today for follow up after brain MRI. Denies any neurologic complaints today.  Does have ongoing issues with his short term memory, stable from prior.  No changes in his gait.  Continues on observation with Dr. Sherrod.    H+P (04/18/20) Patient presents following recent brain MRI.  He had completed pre-op SRS and underwent craniotomy for R frontal metastasis, after initial presenting with focal seizures (facial twitching).  He denies focal neurologic complaints today, he is functional and independent.  He is undergoing chemotherapy with Dr. Sherrod (completed [place] and taxol ) without complication.  His gait is unassisted.  Denies any seizures following his initial presentation, compliant with Keppra .      Medications: Current Outpatient Medications on File Prior to Visit  Medication Sig Dispense Refill   aspirin  EC 81 MG tablet Take 1 tablet (81 mg total) by mouth daily. Swallow whole.     atorvastatin  (LIPITOR) 20 MG tablet Take 1 tablet (20 mg total) by mouth daily. 90 tablet 3   benzocaine  (ORAJEL) 10 % mucosal gel Use as directed 1 Application in the mouth or throat as needed for mouth pain. 5.3 g 0   Blood Glucose Monitoring Suppl (BLOOD GLUCOSE MONITOR SYSTEM) w/Device KIT Use as directed to check blood sugar three times daily 1 kit 0   Continuous Glucose Sensor (DEXCOM G7 SENSOR) MISC Place new sensor every 10 days. Use to monitor blood sugar continuously. 3  each 3   glucose blood (ACCU-CHEK GUIDE TEST) test strip USE A NEW STRIP EACH TIME TO CHECK YOUR BLOOD SUGAR. 200 strip 10   insulin  glargine (LANTUS ) 100 UNIT/ML Solostar Pen Inject 15 Units into the skin daily. 15 mL 0   Insulin  Pen Needle (PEN NEEDLES) 31G X 5 MM MISC Use to inject insulin  three times daily as directed 100 each 3    isosorbide  mononitrate (IMDUR ) 60 MG 24 hr tablet Take 60 mg by mouth daily.     Lancet Device MISC 1 each by Does not apply route 3 (three) times daily. May dispense any manufacturer covered by patient's insurance. 1 each 0   Lancets MISC Use to check blood sugar three times daily as directed 100 each 0   levETIRAcetam  (KEPPRA ) 750 MG tablet TAKE 1 TABLET(750 MG) BY MOUTH TWICE DAILY 180 tablet 4   losartan -hydrochlorothiazide  (HYZAAR) 50-12.5 MG tablet Take 1 tablet by mouth daily. 90 tablet 3   metFORMIN  (GLUCOPHAGE ) 500 MG tablet TAKE 1 TABLET(500 MG) BY MOUTH TWICE DAILY WITH A MEAL 120 tablet 3   metoprolol  succinate (TOPROL  XL) 25 MG 24 hr tablet Take 0.5 tablets (12.5 mg total) by mouth at bedtime. 90 tablet 3   Sofosbuvir -Velpatasvir  (EPCLUSA ) 400-100 MG TABS Take 1 tablet by mouth daily.     No current facility-administered medications on file prior to visit.    Allergies:  Allergies  Allergen Reactions   Jardiance  [Empagliflozin ]     Caused diarrhea & itching, so we stopped   Penicillins Rash    Did it involve swelling of the face/tongue/throat, SOB, or low BP? N Did it involve sudden or severe rash/hives, skin peeling, or any reaction on the inside of your mouth or nose? Y Did you need to seek medical attention at a hospital or doctor's office? Y When did it last happen?    childhood   If all above answers are "NO", may proceed with cephalosporin use.    Past Medical History:  Past Medical History:  Diagnosis Date   Adjustment disorder with depressed mood 01/06/2020   At risk for delirium 02/12/2024   Bilateral primary osteoarthritis of knee 12/06/2021   Brain mass    brain cancer   Bursitis of right hip    CAD (coronary artery disease) 12/29/2023   Chest pain 08/12/2018   Chronic cough    Chronic hepatitis C without hepatic coma (HCC) 09/12/2023   Identified on routine screening 09/2023  Genotype 1a (10-2023)              VL 1.6 millioin     Cognitive impairment  03/14/2021   Complex partial seizures (HCC) - first manifestation of brain mets 2021 12/09/2019   Dependence on nicotine  from cigarettes    Erectile dysfunction 07/11/2021   Frequent headaches 06/27/2020   Heart failure with recovered ejection fraction (HFrecEF) (HCC) 12/29/2023   History of diabetic ketoacidosis 12/28/2023   1st episode 12/2024 in setting of sudden poor control (medication non-adherence suspected). Insulin  initiated.     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    History of primary non-small cell carcinoma of left lung 12/31/2019   S/p right frontal craniotomy with tumor resection on 8/26     Hyperlipidemia 02/19/2023   Current Medications: atorvastatin  20mg   Intolerances: None  Risk Factors:CAD, DM, HTN   LDL-C goal: <55     Hypertension associated with type 2 diabetes mellitus (HCC) 07/28/2018   Partial symptomatic epilepsy with complex partial seizures, not intractable,  without status epilepticus (HCC) 12/09/2019   Port-A-Cath in place 07/05/2020   PVC (premature ventricular contraction) 05/21/2022   Abnormal rhythm noted on cardiac auscultation and EKG obtained that showed sinus rhythm with premature ventricular complexes. Significant PVC burden with around 30 % on EKG. Has albuterol  but uses it very sparingly with few times a month.      Seizures (HCC)    Thyroid  nodule 01/04/2020   See Non-small cell carcinoma of lung.     01/11/2020 US : The hypermetabolic lesion identified on the prior PET-CT corresponds  with a 1.3 cm TI-RADS category 4 nodule in the deep aspect of the  right mid gland. Hypermetabolic activity on PET is associated with  an approximately 40% malignancy rate and supersedes the TI-RADS  classification system. Recommend further evaluation with fine-needle  asp   Uncontrolled type 2 diabetes mellitus with hyperglycemia, without long-term current use of insulin  (HCC) 01/06/2020   Past Surgical History:  Past Surgical History:  Procedure  Laterality Date   APPLICATION OF CRANIAL NAVIGATION N/A 01/07/2020   Procedure: APPLICATION OF CRANIAL NAVIGATION;  Surgeon: Gillie Duncans, MD;  Location: [facility] OR;  Service: Neurosurgery;  Laterality: N/A;   CRANIOTOMY Right 01/07/2020   Procedure: RIGHT CRANIOTOMY FOR TUMOR RESECTION;  Surgeon: Gillie Duncans, MD;  Location: [facility] OR;  Service: Neurosurgery;  Laterality: Right;  rm 21   ENDOBRONCHIAL ULTRASOUND N/A 12/21/2019   Procedure: ENDOBRONCHIAL ULTRASOUND;  Surgeon: Neda Jennet LABOR, MD;  Location: [facility] ENDOSCOPY;  Service: Pulmonary;  Laterality: N/A;   FINE NEEDLE ASPIRATION  12/21/2019   Procedure: FINE NEEDLE ASPIRATION (FNA) LINEAR;  Surgeon: Neda Jennet LABOR, MD;  Location: [facility] ENDOSCOPY;  Service: Pulmonary;;   IR IMAGING GUIDED PORT INSERTION  03/03/2020   VIDEO BRONCHOSCOPY N/A 12/21/2019   Procedure: VIDEO BRONCHOSCOPY WITHOUT FLUORO;  Surgeon: Neda Jennet LABOR, MD;  Location: [facility] END[facility];  Service: Pulmonary;  Laterality: N/A;   Social History:  Social History   Socioeconomic History   Marital status: Divorced    Spouse name: Not on file   Number of children: 3   Years of education: Not on file   Highest education level: Not on file  Occupational History   Occupation: Retired  Tobacco Use   Smoking status: Former    Current packs/day: 0.00    Average packs/day: 1 pack/day for 49.4 years (49.4 ttl pk-yrs)    Types: Cigarettes    Start date: 08/07/1970    Quit date: 12/15/2019    Years since quitting: 4.1   Smokeless tobacco: Never  Vaping Use   Vaping status: Never Used  Substance and Sexual Activity   Alcohol use: Not Currently   Drug use: Never   Sexual activity: Not on file    Comment: YES  Other Topics Concern   Not on file  Social History Narrative   Lives with son   Social Drivers of Health   Financial Resource Strain: Medium Risk (04/24/2023)   Overall Financial Resource Strain (CARDIA)    Difficulty of Paying Living Expenses: Somewhat hard  Food  Insecurity: No Food Insecurity (12/29/2023)   Hunger Vital Sign    Worried About Running Out of Food in the Last Year: Never true    Ran Out of Food in the Last Year: Never true  Transportation Needs: No Transportation Needs (12/29/2023)   PRAPARE - Administrator, Civil Service (Medical): No    Lack of Transportation (Non-Medical): No  Physical Activity: Sufficiently Active (04/24/2023)   Exercise Vital Sign  Days of Exercise per Week: 3 days    Minutes of Exercise per Session: 60 min  Stress: No Stress Concern Present (04/24/2023)   Jared Shea [employer] Occupational [employer] - Occupational Stress Questionnaire    Feeling of Stress : Not at all  Social Connections: Unknown (12/29/2023)   Social Connection and Isolation Panel    Frequency of Communication with Friends and Family: Three times a week    Frequency of Social Gatherings with Friends and Family: Three times a week    Attends Religious Services: More than 4 times per year    Active Member of Clubs or Organizations: No    Attends Banker Meetings: Never    Marital Status: Not on file  Intimate Partner Violence: Not At Risk (12/29/2023)   Humiliation, Afraid, Rape, and Kick questionnaire    Fear of Current or Ex-Partner: No    Emotionally Abused: No    Physically Abused: No    Sexually Abused: No   Family History:  Family History  Problem Relation Age of Onset   Heart disease Mother        CABG   Hypertension Mother    Kidney disease Sister    Hypertension Sister    Diabetes Brother    Heart attack Brother    Kidney disease Brother        KIDNEY TRANSPLANT   Healthy Daughter    Healthy Daughter    Healthy Son     Review of Systems: Constitutional: Doesn't report fevers, chills or abnormal weight loss Eyes: Doesn't report blurriness of vision Ears, nose, mouth, throat, and face: Doesn't report sore throat Respiratory: Doesn't report cough, dyspnea or wheezes Cardiovascular: Doesn't report  palpitation, chest discomfort  Gastrointestinal:  Doesn't report nausea, constipation, diarrhea GU: Doesn't report incontinence Skin: Doesn't report skin rashes Neurological: Per HPI Musculoskeletal: diffuse joint pain Behavioral/Psych: Doesn't report anxiety  Physical Exam: Wt Readings from Last 3 Encounters:  02/20/24 181 lb (82.1 kg)  02/11/24 179 lb 9.6 oz (81.5 kg)  02/11/24 179 lb 9.6 oz (81.5 kg)   Temp Readings from Last 3 Encounters:  02/20/24 97.8 F (36.6 C) (Temporal)  02/11/24 97.8 F (36.6 C) (Oral)  02/03/24 98.2 F (36.8 C) (Oral)   BP Readings from Last 3 Encounters:  02/20/24 (!) 121/56  02/11/24 114/66  02/03/24 115/63   Pulse Readings from Last 3 Encounters:  02/20/24 89  02/11/24 82  02/03/24 89    KPS: 80. General: Alert, cooperative, pleasant, in no acute distress Head: Normal EENT: No conjunctival injection or scleral icterus.  Lungs: Resp effort normal Cardiac: Regular rate Abdomen: Non-distended abdomen Skin: No rashes cyanosis or petechiae. Extremities: No clubbing or edema  Neurologic Exam: Mental Status: Awake, alert, attentive to examiner. Oriented to self and environment. Language is fluent with intact comprehension.  Cranial Nerves: Visual acuity is grossly normal. Visual fields are full. Extra-ocular movements intact. No ptosis. Face is symmetric Motor: Tone and bulk are normal. Power is full in both arms and legs. Reflexes are symmetric, no pathologic reflexes present.  Sensory: Intact to light touch Gait: Normal.   Labs: I have reviewed the data as listed    Component Value Date/Time   NA 133 (L) 12/31/2023 0944   NA 143 09/05/2023 1015   K 4.4 12/31/2023 0944   CL 98 12/31/2023 0944   CO2 21 (L) 12/31/2023 0944   GLUCOSE 319 (H) 12/31/2023 0944   BUN 11 12/31/2023 0944   BUN 16 09/05/2023 1015  CREATININE 0.73 12/31/2023 0944   CREATININE 0.72 05/16/2023 1028   CALCIUM  9.4 12/31/2023 0944   PROT 8.0 12/29/2023 1838    PROT 8.3 02/01/2023 0952   ALBUMIN 4.1 12/29/2023 1838   ALBUMIN 4.9 02/01/2023 0952   AST 14 (L) 12/29/2023 1838   AST 19 05/16/2023 1028   ALT 11 12/29/2023 1838   ALT 17 05/16/2023 1028   ALKPHOS 65 12/29/2023 1838   BILITOT 1.0 12/29/2023 1838   BILITOT 0.6 05/16/2023 1028   GFRNONAA >60 12/31/2023 0944   GFRNONAA >60 05/16/2023 1028   GFRAA >60 02/15/2020 1000   Lab Results  Component Value Date   WBC 8.1 12/29/2023   NEUTROABS 5.9 12/29/2023   HGB 12.8 (L) 12/29/2023   HCT 38.9 (L) 12/29/2023   MCV 87.0 12/29/2023   PLT 435 (H) 12/29/2023    Imaging:  [facility] Clinician Interpretation: I have personally reviewed the CNS images as listed.  My interpretation, in the context of the patient's clinical presentation, is stable disease   MR BRAIN W WO CONTRAST Result Date: 02/19/2024 EXAM: MRI BRAIN WITH AND WITHOUT CONTRAST 02/19/2024 06:42:00 PM TECHNIQUE: Multiplanar multisequence MRI of the head/brain was performed with and without the administration of intravenous contrast. 8.2 mL gadobutrol  (GADAVIST ) 1 MMOL/ML injection. COMPARISON: MRI head 12/31/2023 CLINICAL HISTORY: Brain/CNS neoplasm, assess treatment response. Malignant neoplasm metastatic to brain [facility]). FINDINGS: BRAIN AND VENTRICLES: Prior right parietal craniotomy. Small underlying resection cavity/biopsy site within the posterior right frontal lobe with surrounding gliosis, which is unchanged. No enhancing lesion. Small remote lacunar infarct in right caudate. Patchy T2 hyperintensity in the white matter, compatible with chronic microvascular ischemic change. No abnormal enhancement. No acute infarct. No acute intracranial hemorrhage. No mass effect or midline shift. No hydrocephalus. Normal flow voids. ORBITS: No acute abnormality. SINUSES: No acute abnormality. BONES AND SOFT TISSUES: Normal bone marrow signal and enhancement. No acute soft tissue abnormality. IMPRESSION: 1. Stable post-surgical changes in the posterior  right frontal lobe without evidence of residual or recurrent lesion. 2. Mild chronic microvascular ischemic change in the white matter. Electronically signed by: Gilmore Molt MD 02/19/2024 11:20 PM EDT RP Workstation: [id number]     Assessment/Plan History of cancer metastatic to brain [Z85.89]  [name] is clinically and radiographically stable today. MRI brain demonstrates stable findings, now 4 years s/p last round of SRS.  May decrease Keppra  to 500mg  BID for seizure prevention.  He otherwise continues on observation for NSCLC with Dr. Sherrod.  We appreciate the opportunity to participate in the care of Earlin Sweeden..    We ask that [name] return to clinic in 12 months following next brain MRI, or sooner as needed.  All questions were answered. The patient knows to call the clinic with any problems, questions or concerns. No barriers to learning were detected.  The total time spent in the encounter was 30 minutes and more than 50% was on counseling and review of test results   Arthea MARLA Manns, MD Medical Director of Neuro-Oncology [facility] at [place] 02/20/24 9:53 AM

## 2024-02-20 NOTE — Telephone Encounter (Signed)
 Depending on what we see when he meets with donna as far as blood glucose measurements may need to adjust meds. Would not want him dropping into the 50s and 60s

## 2024-02-24 ENCOUNTER — Inpatient Hospital Stay

## 2024-02-25 ENCOUNTER — Other Ambulatory Visit: Payer: Self-pay | Admitting: Radiation Therapy

## 2024-02-26 ENCOUNTER — Ambulatory Visit: Admitting: Dietician

## 2024-02-26 ENCOUNTER — Other Ambulatory Visit: Payer: Self-pay | Admitting: Dietician

## 2024-02-26 VITALS — Wt 179.0 lb

## 2024-02-26 DIAGNOSIS — D8481 Immunodeficiency due to conditions classified elsewhere: Secondary | ICD-10-CM | POA: Diagnosis not present

## 2024-02-26 DIAGNOSIS — Z8546 Personal history of malignant neoplasm of prostate: Secondary | ICD-10-CM | POA: Diagnosis not present

## 2024-02-26 DIAGNOSIS — Z85841 Personal history of malignant neoplasm of brain: Secondary | ICD-10-CM | POA: Diagnosis not present

## 2024-02-26 DIAGNOSIS — E1165 Type 2 diabetes mellitus with hyperglycemia: Secondary | ICD-10-CM

## 2024-02-26 DIAGNOSIS — I099 Rheumatic heart disease, unspecified: Secondary | ICD-10-CM | POA: Diagnosis not present

## 2024-02-26 DIAGNOSIS — I499 Cardiac arrhythmia, unspecified: Secondary | ICD-10-CM | POA: Diagnosis not present

## 2024-02-26 DIAGNOSIS — I700 Atherosclerosis of aorta: Secondary | ICD-10-CM | POA: Diagnosis not present

## 2024-02-26 DIAGNOSIS — D84821 Immunodeficiency due to drugs: Secondary | ICD-10-CM | POA: Diagnosis not present

## 2024-02-26 DIAGNOSIS — C3490 Malignant neoplasm of unspecified part of unspecified bronchus or lung: Secondary | ICD-10-CM | POA: Diagnosis not present

## 2024-02-26 DIAGNOSIS — Z85848 Personal history of malignant neoplasm of other parts of nervous tissue: Secondary | ICD-10-CM | POA: Diagnosis not present

## 2024-02-26 DIAGNOSIS — E119 Type 2 diabetes mellitus without complications: Secondary | ICD-10-CM | POA: Diagnosis not present

## 2024-02-26 DIAGNOSIS — Z7982 Long term (current) use of aspirin: Secondary | ICD-10-CM | POA: Diagnosis not present

## 2024-02-26 DIAGNOSIS — M1990 Unspecified osteoarthritis, unspecified site: Secondary | ICD-10-CM | POA: Diagnosis not present

## 2024-02-26 DIAGNOSIS — I25119 Atherosclerotic heart disease of native coronary artery with unspecified angina pectoris: Secondary | ICD-10-CM | POA: Diagnosis not present

## 2024-02-26 DIAGNOSIS — G40909 Epilepsy, unspecified, not intractable, without status epilepticus: Secondary | ICD-10-CM | POA: Diagnosis not present

## 2024-02-26 DIAGNOSIS — Z794 Long term (current) use of insulin: Secondary | ICD-10-CM | POA: Diagnosis not present

## 2024-02-26 DIAGNOSIS — J439 Emphysema, unspecified: Secondary | ICD-10-CM | POA: Diagnosis not present

## 2024-02-26 DIAGNOSIS — G3184 Mild cognitive impairment, so stated: Secondary | ICD-10-CM | POA: Diagnosis not present

## 2024-02-26 DIAGNOSIS — Z7984 Long term (current) use of oral hypoglycemic drugs: Secondary | ICD-10-CM | POA: Diagnosis not present

## 2024-02-26 DIAGNOSIS — F325 Major depressive disorder, single episode, in full remission: Secondary | ICD-10-CM | POA: Diagnosis not present

## 2024-02-26 DIAGNOSIS — Z880 Allergy status to penicillin: Secondary | ICD-10-CM | POA: Diagnosis not present

## 2024-02-26 DIAGNOSIS — I429 Cardiomyopathy, unspecified: Secondary | ICD-10-CM | POA: Diagnosis not present

## 2024-02-26 DIAGNOSIS — Z833 Family history of diabetes mellitus: Secondary | ICD-10-CM | POA: Diagnosis not present

## 2024-02-26 DIAGNOSIS — B182 Chronic viral hepatitis C: Secondary | ICD-10-CM | POA: Diagnosis not present

## 2024-02-26 DIAGNOSIS — Z8589 Personal history of malignant neoplasm of other organs and systems: Secondary | ICD-10-CM | POA: Diagnosis not present

## 2024-02-26 DIAGNOSIS — N529 Male erectile dysfunction, unspecified: Secondary | ICD-10-CM | POA: Diagnosis not present

## 2024-02-26 MED ORDER — INSULIN GLARGINE 100 UNIT/ML SOLOSTAR PEN: 10.0000 [IU] | PEN_INJECTOR | Freq: Every day | SUBCUTANEOUS | 0 refills | Status: DC

## 2024-02-26 NOTE — Patient Instructions (Addendum)
 Thank you for your visit today!  You are doing a good job taking care of your diabetes.   Please restart your Lantus  insulin  at 10 units starting tomorrow.   I will ask for a refill on your Lantus  insulin  today and have it sent to [facility] so Friday you can pick up you insulin  and your sensors.    You can talk to Dr. Elicia about your desire to stop insulin  when you see him on November 13  You can pick up more Dexcom sensors at [facility] on Friday 02-28-24 after 8 AM.   Place a new one after the current ones stops.    I would like to see you in 6 months  Jared Shea [phone] (new number)

## 2024-02-26 NOTE — Telephone Encounter (Signed)
 This was done due to low blood sugars recorded. Can adjust at next visit based off of A1c

## 2024-02-26 NOTE — Telephone Encounter (Signed)
 Patient request refill of new dose of 10 units daily Lantus  per Dr. Darrow sent to [facility]. Jared Shea

## 2024-02-26 NOTE — Progress Notes (Signed)
 Diabetes Self-Management Education  Visit Type: Annual Follow-Up  Appt. Start Time: 915 Appt. End Time: 1001  02/26/2024  [name], identified by name and date of birth, is a 68 y.o. male with a diagnosis of Diabetes:  .   ASSESSMENT  Weight 179 lb (81.2 kg). Body mass index is 24.28 kg/m. Weight appropriate.   Reports no insulin  in 2-3 days. He gives his own shots. Wants to get off of insulin  like he did before. He states he has Two more shots then out of insulin  then says he has insulin  pens in the frig. He seems to be confused about how much insulin  he has at home.  He should be asked to bring his medication to all visits.   I shared the CGM report with Dr. Kem who decreased his insulin  dose to 10 units today and wanted him to have 1 month fu which is already scheduled.   CGM Results from download:  Last 2 weeks 10/2-10/15  % Time CGM active:   98.9 %   (Goal >70%)  Average glucose:   134 mg/dL for 14 days  Glucose management indicator:   6.5 %  Time in range (70-180 mg/dL):   94 %   (Goal >29%)  Time High (181-250 mg/dL):   5 %   (Goal < 74%)  Time Very High (>250 mg/dL):    1 %   (Goal < 5%)  Time Low (54-69 mg/dL):   0 %   (Goal <5%)  Time Very Low (<54 mg/dL):   <1 %   (Goal <8%)  Coefficient of variation:   22 %   (Goal <36%)     Diabetes Self-Management Education - 02/26/24 1000       Visit Information   Visit Type Annual Follow-Up      Health Coping   How would you rate your overall health? Good      Complications   How often do you check your blood sugar? > 4 times/day   cgm   Fasting Blood glucose range (mg/dL) <29;29-870;869-820   blood sugar was overall lower last thursday and friday and this is when he had several lows down to 50s.  unsure if this was due to more activity. injeftion site, etc. patient unsure. what caused it   Postprandial Blood glucose range (mg/dL) 869-820;819-799;>799   most days his blood sugar is mostly  in the 140-180  range   Number of hypoglycemic episodes per month 4    Can you tell when your blood sugar is low? Yes    What do you do if your blood sugar is low? eats or drinks something sweet like soda    Number of hyperglycemic episodes ( >200mg /dL): Weekly    Can you tell when your blood sugar is high? No    What do you do if your blood sugar is high? drinks water    Have you had a dilated eye exam in the past 12 months? Yes    Are you checking your feet? Yes    How many days per week are you checking your feet? 7      Dietary Intake   Breakfast 4 eggs and [place] bacon from [employer]      Activity / Exercise   Activity / Exercise Type ADL's;Light (walking / raking leaves)      Patient Education   Previous Diabetes Education Yes    Monitoring Identified appropriate SMBG and/or A1C goals.    Acute complications Taught  prevention, symptoms, and  treatment of hypoglycemia - the 15 rule.   he is carrying glucsoe tablets today. he was urged to wait 15 minutes for blood sugar to rise after treating before retreating     Individualized Goals (developed by patient)   Monitoring  Consistenly use CGM    Problem Solving Medication consistency    Reducing Risk treat hypoglycemia with 15 grams of carbs if blood glucose less than 70mg /dL      Patient Self-Evaluation of Goals - Patient rates self as meeting previously set goals (% of time)   Monitoring >75% (most of the time)    Problem Solving and behavior change strategies  50 - 75 % (half of the time)   he states he knows peanut butter is not good to treat low blood sugar   Reducing Risk (treating acute and chronic complications) 50 - 75 % (half of the time)      Post-Education Assessment   Patient understands monitoring blood glucose, interpreting and using results Comprehends key points    Patient understands prevention, detection, and treatment of acute complications. Comprehends key points      Outcomes   Expected Outcomes Demonstrated interest  in learning but significant barriers to change   his memory issues present a problem/barrier   Future DMSE 6 months    Program Status Completed      Subsequent Visit   Since your last visit have you continued or begun to take your medications as prescribed? No   he stopped taking insulin  3 days ago because he thought blood sugar was okay. he states he has 3 doses of insulin  left and then after that says he has 3 pens left in the frig. He states he would like to come off insulin  like he did before.   Since your last visit have you had your blood pressure checked? No    Since your last visit have you experienced any weight changes? No change    Since your last visit, are you checking your blood glucose at least once a day? Yes   blood sugar on reciever today was 171-173 with flat trend arrow. he states his blood sugar was 130 when he awoke and all he has had to eat or drink today was water         Individualized Plan for Diabetes Self-Management Training:   Learning Objective:  Patient will have a greater understanding of diabetes self-management. Patient education plan is to attend individual and/or group sessions per assessed needs and concerns.   Plan:   Patient Instructions  Thank you for your visit today!  You are doing a good job taking care of your diabetes.   Please restart your Lantus  insulin  at 10 units starting tomorrow.   I will ask for a refill on your Lantus  insulin  today and have it sent to [facility] so Friday you can pick up you insulin  and your sensors.    You can talk to Dr. Elicia about your desire to stop insulin  when you see him on November 13  You can pick up more Dexcom sensors at [facility] on Friday 02-28-24 after 8 AM.   Place a new one after the current ones stops.    I would like to see you in 6 months  Jared [phone] (new number)     Expected Outcomes:  Demonstrated interest in learning but significant barriers to change (his memory  issues present a problem/barrier)  Education material provided: Diabetes Resources  If  problems or questions, patient to contact team via:  Phone  Future DSME appointment: 6 months Jared Shea, RD 02/26/2024 11:58 AM.

## 2024-02-28 ENCOUNTER — Telehealth: Payer: Self-pay | Admitting: Internal Medicine

## 2024-02-28 NOTE — Telephone Encounter (Signed)
 Scheduled patient for next year. Called and left a voicemail with the appointment details.

## 2024-03-10 ENCOUNTER — Encounter: Admitting: Student

## 2024-03-24 ENCOUNTER — Telehealth: Payer: Self-pay | Admitting: *Deleted

## 2024-03-24 NOTE — Telephone Encounter (Signed)
 PCS forms(red folder # 1) givent to Dr. Zheng to be completed

## 2024-03-26 ENCOUNTER — Ambulatory Visit (INDEPENDENT_AMBULATORY_CARE_PROVIDER_SITE_OTHER): Admitting: Student

## 2024-03-26 ENCOUNTER — Encounter: Payer: Self-pay | Admitting: Student

## 2024-03-26 VITALS — BP 148/68 | HR 73 | Temp 97.5°F | Ht 72.0 in | Wt 183.0 lb

## 2024-03-26 DIAGNOSIS — Z794 Long term (current) use of insulin: Secondary | ICD-10-CM

## 2024-03-26 DIAGNOSIS — E1159 Type 2 diabetes mellitus with other circulatory complications: Secondary | ICD-10-CM | POA: Diagnosis not present

## 2024-03-26 DIAGNOSIS — Z7984 Long term (current) use of oral hypoglycemic drugs: Secondary | ICD-10-CM

## 2024-03-26 DIAGNOSIS — Z87891 Personal history of nicotine dependence: Secondary | ICD-10-CM | POA: Diagnosis not present

## 2024-03-26 DIAGNOSIS — Z79899 Other long term (current) drug therapy: Secondary | ICD-10-CM

## 2024-03-26 DIAGNOSIS — I152 Hypertension secondary to endocrine disorders: Secondary | ICD-10-CM

## 2024-03-26 DIAGNOSIS — E1165 Type 2 diabetes mellitus with hyperglycemia: Secondary | ICD-10-CM

## 2024-03-26 DIAGNOSIS — E041 Nontoxic single thyroid nodule: Secondary | ICD-10-CM | POA: Diagnosis not present

## 2024-03-26 DIAGNOSIS — Z7189 Other specified counseling: Secondary | ICD-10-CM | POA: Diagnosis not present

## 2024-03-26 DIAGNOSIS — E119 Type 2 diabetes mellitus without complications: Secondary | ICD-10-CM

## 2024-03-26 LAB — POCT GLYCOSYLATED HEMOGLOBIN (HGB A1C): HbA1c, POC (controlled diabetic range): 6 % (ref 0.0–7.0)

## 2024-03-26 LAB — GLUCOSE, CAPILLARY: Glucose-Capillary: 170 mg/dL — ABNORMAL HIGH (ref 70–99)

## 2024-03-26 MED ORDER — METFORMIN HCL 500 MG PO TABS: 500.0000 mg | ORAL_TABLET | Freq: Two times a day (BID) | ORAL | 3 refills | Status: AC

## 2024-03-26 MED ORDER — DEXCOM G7 SENSOR MISC
3 refills | Status: AC
Start: 2024-03-26 — End: ?

## 2024-03-26 NOTE — Assessment & Plan Note (Deleted)
 SABRA

## 2024-03-26 NOTE — Assessment & Plan Note (Addendum)
 Status: Uncontrolled. BP today 148/68. Reports taking metoprolol  12.5 mg daily, losartan -HCTZ 50-12.5 mg daily, Imdur  60 mg daily. Unclear of taken medications today. Last BMP in 12/2023 with normal renal function. Prior OV w/ controlled HTN. Plan to reassess at next OV, ensure pt takes BP meds, pt to bring all meds in at next OV.   Plan -Continue: metoprolol , losartan -hydrochlorothiazide   -F/u visit planned for 1 month

## 2024-03-26 NOTE — Progress Notes (Signed)
 CC: T2DM  HPI: [name] is a 68 y.o. male living with a history stated below and presents today for T2DM. Please see problem based assessment and plan for additional details.  Past Medical History:  Diagnosis Date   Adjustment disorder with depressed mood 01/06/2020   At risk for delirium 02/12/2024   Bilateral primary osteoarthritis of knee 12/06/2021   Brain mass    brain cancer   Bursitis of right hip    CAD (coronary artery disease) 12/29/2023   Chest pain 08/12/2018   Chronic cough    Chronic hepatitis C without hepatic coma (HCC) 09/12/2023   Identified on routine screening 09/2023  Genotype 1a (10-2023)              VL 1.6 millioin     Cognitive impairment 03/14/2021   Complex partial seizures (HCC) - first manifestation of brain mets 2021 12/09/2019   Dependence on nicotine  from cigarettes    Erectile dysfunction 07/11/2021   Frequent headaches 06/27/2020   Heart failure with recovered ejection fraction (HFrecEF) (HCC) 12/29/2023   History of diabetic ketoacidosis 12/28/2023   1st episode 12/2024 in setting of sudden poor control (medication non-adherence suspected). Insulin  initiated.     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    History of primary non-small cell carcinoma of left lung 12/31/2019   S/p right frontal craniotomy with tumor resection on 8/26     Hyperlipidemia 02/19/2023   Current Medications: atorvastatin  20mg   Intolerances: None  Risk Factors:CAD, DM, HTN   LDL-C goal: <55     Hypertension associated with type 2 diabetes mellitus (HCC) 07/28/2018   Partial symptomatic epilepsy with complex partial seizures, not intractable, without status epilepticus (HCC) 12/09/2019   Port-A-Cath in place 07/05/2020   PVC (premature ventricular contraction) 05/21/2022   Abnormal rhythm noted on cardiac auscultation and EKG obtained that showed sinus rhythm with premature ventricular complexes. Significant PVC burden with around 30 % on EKG. Has  albuterol  but uses it very sparingly with few times a month.      Seizures (HCC)    Thyroid  nodule 01/04/2020   See Non-small cell carcinoma of lung.     01/11/2020 US : The hypermetabolic lesion identified on the prior PET-CT corresponds  with a 1.3 cm TI-RADS category 4 nodule in the deep aspect of the  right mid gland. Hypermetabolic activity on PET is associated with  an approximately 40% malignancy rate and supersedes the TI-RADS  classification system. Recommend further evaluation with fine-needle  asp   Uncontrolled type 2 diabetes mellitus with hyperglycemia, without long-term current use of insulin  (HCC) 01/06/2020    Current Outpatient Medications on File Prior to Visit  Medication Sig Dispense Refill   aspirin  EC 81 MG tablet Take 1 tablet (81 mg total) by mouth daily. Swallow whole.     atorvastatin  (LIPITOR) 20 MG tablet Take 1 tablet (20 mg total) by mouth daily. 90 tablet 3   benzocaine  (ORAJEL) 10 % mucosal gel Use as directed 1 Application in the mouth or throat as needed for mouth pain. 5.3 g 0   Blood Glucose Monitoring Suppl (BLOOD GLUCOSE MONITOR SYSTEM) w/Device KIT Use as directed to check blood sugar three times daily 1 kit 0   glucose blood (ACCU-CHEK GUIDE TEST) test strip USE A NEW STRIP EACH TIME TO CHECK YOUR BLOOD SUGAR. 200 strip 10   insulin  glargine (LANTUS ) 100 UNIT/ML Solostar Pen Inject 10 Units into the skin daily. 15 mL 0   Insulin   Pen Needle (PEN NEEDLES) 31G X 5 MM MISC Use to inject insulin  three times daily as directed 100 each 3   isosorbide  mononitrate (IMDUR ) 60 MG 24 hr tablet Take 60 mg by mouth daily.     Lancet Device MISC 1 each by Does not apply route 3 (three) times daily. May dispense any manufacturer covered by patient's insurance. 1 each 0   Lancets MISC Use to check blood sugar three times daily as directed 100 each 0   levETIRAcetam  (KEPPRA ) 500 MG tablet Take 1 tablet (500 mg total) by mouth 2 (two) times daily. 60 tablet 11    losartan -hydrochlorothiazide  (HYZAAR) 50-12.5 MG tablet Take 1 tablet by mouth daily. 90 tablet 3   metoprolol  succinate (TOPROL  XL) 25 MG 24 hr tablet Take 0.5 tablets (12.5 mg total) by mouth at bedtime. 90 tablet 3   Sofosbuvir -Velpatasvir  (EPCLUSA ) 400-100 MG TABS Take 1 tablet by mouth daily.     No current facility-administered medications on file prior to visit.    Family History  Problem Relation Age of Onset   Heart disease Mother        CABG   Hypertension Mother    Kidney disease Sister    Hypertension Sister    Diabetes Brother    Heart attack Brother    Kidney disease Brother        KIDNEY TRANSPLANT   Healthy Daughter    Healthy Daughter    Healthy Son     Social History   Socioeconomic History   Marital status: Divorced    Spouse name: Not on file   Number of children: 3   Years of education: Not on file   Highest education level: Not on file  Occupational History   Occupation: Retired  Tobacco Use   Smoking status: Former    Current packs/day: 0.00    Average packs/day: 1 pack/day for 49.4 years (49.4 ttl pk-yrs)    Types: Cigarettes    Start date: 08/07/1970    Quit date: 12/15/2019    Years since quitting: 4.2   Smokeless tobacco: Never  Vaping Use   Vaping status: Never Used  Substance and Sexual Activity   Alcohol use: Not Currently   Drug use: Never   Sexual activity: Not on file    Comment: YES  Other Topics Concern   Not on file  Social History Narrative   Lives with son   Social Drivers of Health   Financial Resource Strain: Medium Risk (04/24/2023)   Overall Financial Resource Strain (CARDIA)    Difficulty of Paying Living Expenses: Somewhat hard  Food Insecurity: No Food Insecurity (12/29/2023)   Hunger Vital Sign    Worried About Running Out of Food in the Last Year: Never true    Ran Out of Food in the Last Year: Never true  Transportation Needs: No Transportation Needs (12/29/2023)   PRAPARE - Scientist, Research (physical Sciences) (Medical): No    Lack of Transportation (Non-Medical): No  Physical Activity: Sufficiently Active (04/24/2023)   Exercise Vital Sign    Days of Exercise per Week: 3 days    Minutes of Exercise per Session: 60 min  Stress: No Stress Concern Present (04/24/2023)   [employer] of Occupational Health - Occupational Stress Questionnaire    Feeling of Stress : Not at all  Social Connections: Unknown (12/29/2023)   Social Connection and Isolation Panel    Frequency of Communication with Friends and Family: Three times a week  Frequency of Social Gatherings with Friends and Family: Three times a week    Attends Religious Services: More than 4 times per year    Active Member of Clubs or Organizations: No    Attends Banker Meetings: Never    Marital Status: Not on file  Intimate Partner Violence: Not At Risk (12/29/2023)   Humiliation, Afraid, Rape, and Kick questionnaire    Fear of Current or Ex-Partner: No    Emotionally Abused: No    Physically Abused: No    Sexually Abused: No    Review of Systems: ROS negative except for what is noted on the assessment and plan.  Vitals:   03/26/24 1022 03/26/24 1042 03/26/24 1122  BP: (!) 151/75 (!) 148/68   Pulse: 76 75 73  Temp:   (!) 97.5 F (36.4 C)  TempSrc: Oral  Oral  SpO2: 92%  98%  Weight: 183 lb (83 kg)    Height: 6' (1.829 m)     Physical Exam: Constitutional: alert, sitting up in chair, in no acute distress Cardiovascular: regular rate  Pulmonary/Chest: normal work of breathing on room air Neurological: alert & oriented x 3 Skin: warm and dry  Assessment & Plan:   Assessment & Plan Type 2 diabetes mellitus without complication, without long-term current use of insulin  (HCC) Status: Controlled/At Goal. Last A1c 13.1 in 12/2023 (of note, had to stop metformin  w/ HCV treatment which is completed).  A1c today is 6.0.  Currently taking Lantus  10 units daily, back on metformin  500 mg daily.   Monitor: Dexcom. Patient endorses hypoglycemia into 24s (lives with son). Hx of DKA so avoid SGLT2. Will titrate up metformin  since he was previously controlled on that before.    Plan -STOP insulin  at this time -INCREASE metformin  to max dose as tolerated, discuss w/ pt's son -A1c in 3 months -Ophthalmology exam: 02/2023 -Urine ACR: 01/2024 -LDL 29 on 04/2023, taking atorvastatin  20 mgvastatin 20 mg, DECLINED lipid panel today, will complete at lab visit or future OV  Orders:   POC Hbg A1C   Glucose, capillary   Continuous Glucose Sensor (DEXCOM G7 SENSOR) MISC; Place new sensor every 10 days. Use to monitor blood sugar continuously.   metFORMIN  (GLUCOPHAGE ) 500 MG tablet; Take 1 tablet (500 mg total) by mouth 2 (two) times daily with a meal.  Hypertension associated with type 2 diabetes mellitus (HCC) Status: Uncontrolled. BP today 148/68. Reports taking metoprolol  12.5 mg daily, losartan -HCTZ 50-12.5 mg daily, Imdur  60 mg daily. Unclear of taken medications today. Last BMP in 12/2023 with normal renal function. Prior OV w/ controlled HTN. Plan to reassess at next OV, ensure pt takes BP meds, pt to bring all meds in at next OV.   Plan -Continue: metoprolol , losartan -hydrochlorothiazide   -F/u visit planned for 1 month    Thyroid  nodule Appears lost to f/u in 12/2019. Hypermetabolic lesion found on PET in 12/2019 that correlated w/ right thyroid  nodule on thyroid  ultrasound 12/2019, had rec FNA but appears did not complete. No acute concerns but will need repeat imaging and possible FNA.   Orders:   US  FNA BX THYROID  1ST LESION AFIRMA; Future  Advance care planning Briefly discussed this. Patient's son here with pt today. Patient's sister Vanna) on phone. Patient stated his POA would be his sister, Cindia, followed by his son, [name]. Provided pamphlet and advance directive paperwork for patient and his family to review and complete.        Return in about 4 weeks (around 04/23/2024)  for diabetes, blood pressure.   Patient discussed with Dr. Jeanelle Ozell Nearing, D.O. [facility], PGY-3 Clinic Phone: [phone] Date 03/26/2024 Time 2:51 PM

## 2024-03-26 NOTE — Assessment & Plan Note (Addendum)
 Appears lost to f/u in 12/2019. Hypermetabolic lesion found on PET in 12/2019 that correlated w/ right thyroid  nodule on thyroid  ultrasound 12/2019, had rec FNA but appears did not complete. No acute concerns but will need repeat imaging and possible FNA.   Orders:   US  FNA BX THYROID  1ST LESION AFIRMA; Future

## 2024-03-26 NOTE — Patient Instructions (Addendum)
 Thank you, Jared Shea. for allowing us  to provide your care today. Today we discussed:  -STOP Insulin  injections  -INCREASE metformin  to 500 mg twice a day for 1 week  --After 1 week and tolerating, increase to 1000 mg in AM and 500 mg in PM for 1 week  --After 1 week and tolerating, increase to max dose 1000 mg in AM and in PM -Bring your medications to your next office visit  -Provided paperwork for advance directives   Follow up: 1-2 months   Should you have any questions or concerns please call the internal medicine clinic at [phone].    Dicy Smigel, D.O. [facility]

## 2024-03-26 NOTE — Assessment & Plan Note (Signed)
 Briefly discussed this. Patient's son here with pt today. Patient's sister Vanna) on phone. Patient stated his POA would be his sister, Cindia, followed by his son, [name]. Provided pamphlet and advance directive paperwork for patient and his family to review and complete.

## 2024-03-26 NOTE — Assessment & Plan Note (Addendum)
 Status: Controlled/At Goal. Last A1c 13.1 in 12/2023 (of note, had to stop metformin  w/ HCV treatment which is completed).  A1c today is 6.0.  Currently taking Lantus  10 units daily, back on metformin  500 mg daily.  Monitor: Dexcom. Patient endorses hypoglycemia into 61s (lives with son). Hx of DKA so avoid SGLT2. Will titrate up metformin  since he was previously controlled on that before.    Plan -STOP insulin  at this time -INCREASE metformin  to max dose as tolerated, discuss w/ pt's son -A1c in 3 months -Ophthalmology exam: 02/2023 -Urine ACR: 01/2024 -LDL 29 on 04/2023, taking atorvastatin  20 mgvastatin 20 mg, DECLINED lipid panel today, will complete at lab visit or future OV  Orders:   POC Hbg A1C   Glucose, capillary   Continuous Glucose Sensor (DEXCOM G7 SENSOR) MISC; Place new sensor every 10 days. Use to monitor blood sugar continuously.   metFORMIN  (GLUCOPHAGE ) 500 MG tablet; Take 1 tablet (500 mg total) by mouth 2 (two) times daily with a meal.

## 2024-03-27 ENCOUNTER — Inpatient Hospital Stay: Attending: Internal Medicine

## 2024-03-27 ENCOUNTER — Ambulatory Visit (HOSPITAL_COMMUNITY)
Admission: RE | Admit: 2024-03-27 | Discharge: 2024-03-27 | Disposition: A | Discharge status: Home / Self Care | Source: Ambulatory Visit | Attending: Physician Assistant | Admitting: Physician Assistant

## 2024-03-27 ENCOUNTER — Encounter: Admitting: Student

## 2024-03-27 DIAGNOSIS — M47816 Spondylosis without myelopathy or radiculopathy, lumbar region: Secondary | ICD-10-CM | POA: Diagnosis not present

## 2024-03-27 DIAGNOSIS — I700 Atherosclerosis of aorta: Secondary | ICD-10-CM | POA: Diagnosis not present

## 2024-03-27 DIAGNOSIS — E785 Hyperlipidemia, unspecified: Secondary | ICD-10-CM | POA: Diagnosis not present

## 2024-03-27 DIAGNOSIS — I2510 Atherosclerotic heart disease of native coronary artery without angina pectoris: Secondary | ICD-10-CM | POA: Diagnosis not present

## 2024-03-27 DIAGNOSIS — C7931 Secondary malignant neoplasm of brain: Secondary | ICD-10-CM | POA: Diagnosis not present

## 2024-03-27 DIAGNOSIS — C3492 Malignant neoplasm of unspecified part of left bronchus or lung: Secondary | ICD-10-CM | POA: Diagnosis not present

## 2024-03-27 DIAGNOSIS — M25512 Pain in left shoulder: Secondary | ICD-10-CM | POA: Diagnosis not present

## 2024-03-27 DIAGNOSIS — J432 Centrilobular emphysema: Secondary | ICD-10-CM | POA: Diagnosis not present

## 2024-03-27 DIAGNOSIS — G40209 Localization-related (focal) (partial) symptomatic epilepsy and epileptic syndromes with complex partial seizures, not intractable, without status epilepticus: Secondary | ICD-10-CM | POA: Diagnosis not present

## 2024-03-27 DIAGNOSIS — C3490 Malignant neoplasm of unspecified part of unspecified bronchus or lung: Secondary | ICD-10-CM | POA: Diagnosis not present

## 2024-03-27 DIAGNOSIS — K5730 Diverticulosis of large intestine without perforation or abscess without bleeding: Secondary | ICD-10-CM | POA: Diagnosis not present

## 2024-03-27 DIAGNOSIS — I5032 Chronic diastolic (congestive) heart failure: Secondary | ICD-10-CM | POA: Diagnosis not present

## 2024-03-27 DIAGNOSIS — M170 Bilateral primary osteoarthritis of knee: Secondary | ICD-10-CM | POA: Diagnosis not present

## 2024-03-27 LAB — CBC WITH DIFFERENTIAL (CANCER CENTER ONLY)
Abs Immature Granulocytes: 0.03 K/uL (ref 0.00–0.07)
Basophils Absolute: 0.1 K/uL (ref 0.0–0.1)
Basophils Relative: 1 %
Eosinophils Absolute: 0.1 K/uL (ref 0.0–0.5)
Eosinophils Relative: 1 %
HCT: 37 % — ABNORMAL LOW (ref 39.0–52.0)
Hemoglobin: 12.5 g/dL — ABNORMAL LOW (ref 13.0–17.0)
Immature Granulocytes: 0 %
Lymphocytes Relative: 26 %
Lymphs Abs: 1.8 K/uL (ref 0.7–4.0)
MCH: 29.6 pg (ref 26.0–34.0)
MCHC: 33.8 g/dL (ref 30.0–36.0)
MCV: 87.5 fL (ref 80.0–100.0)
Monocytes Absolute: 0.5 K/uL (ref 0.1–1.0)
Monocytes Relative: 7 %
Neutro Abs: 4.5 K/uL (ref 1.7–7.7)
Neutrophils Relative %: 65 %
Platelet Count: 399 K/uL (ref 150–400)
RBC: 4.23 MIL/uL (ref 4.22–5.81)
RDW: 14.7 % (ref 11.5–15.5)
WBC Count: 7 K/uL (ref 4.0–10.5)
nRBC: 0 % (ref 0.0–0.2)

## 2024-03-27 LAB — CMP (CANCER CENTER ONLY)
ALT: 7 U/L (ref 0–44)
AST: 11 U/L — ABNORMAL LOW (ref 15–41)
Albumin: 4.4 g/dL (ref 3.5–5.0)
Alkaline Phosphatase: 64 U/L (ref 38–126)
Anion gap: 6 (ref 5–15)
BUN: 16 mg/dL (ref 8–23)
CO2: 28 mmol/L (ref 22–32)
Calcium: 9.5 mg/dL (ref 8.9–10.3)
Chloride: 105 mmol/L (ref 98–111)
Creatinine: 1.03 mg/dL (ref 0.61–1.24)
GFR, Estimated: >60 mL/min (ref >60)
Glucose, Bld: 146 mg/dL — ABNORMAL HIGH (ref 70–99)
Potassium: 4 mmol/L (ref 3.5–5.1)
Sodium: 139 mmol/L (ref 135–145)
Total Bilirubin: 0.6 mg/dL (ref 0.0–1.2)
Total Protein: 7.6 g/dL (ref 6.5–8.1)

## 2024-03-27 LAB — GLUCOSE, CAPILLARY: Glucose-Capillary: 136 mg/dL — ABNORMAL HIGH (ref 70–99)

## 2024-03-27 MED ORDER — HEPARIN SOD (PORK) LOCK FLUSH 100 UNIT/ML IV SOLN
INTRAVENOUS | Status: AC
  Filled 2024-03-27: qty 5

## 2024-03-27 MED ORDER — HEPARIN SOD (PORK) LOCK FLUSH 100 UNIT/ML IV SOLN
500.0000 [IU] | Freq: Once | INTRAVENOUS | Status: AC
  Administered 2024-03-27: 500 [IU] via INTRAVENOUS

## 2024-03-27 MED ORDER — IOHEXOL 300 MG/ML  SOLN
100.0000 mL | Freq: Once | INTRAMUSCULAR | Status: AC | PRN
  Administered 2024-03-27: 100 mL via INTRAVENOUS

## 2024-03-30 ENCOUNTER — Telehealth: Payer: Self-pay | Admitting: *Deleted

## 2024-03-30 ENCOUNTER — Ambulatory Visit: Payer: Self-pay

## 2024-03-30 NOTE — Telephone Encounter (Signed)
 Patient came by the Clinics to be seen.  Patient refused to go to the ER or Urgent Care. Stated pain in in his upper back and is related to position.Has been going on for 2 weeks.  Was scheduled for an appointment on Wednesday as there are no available appointments in the Clinics

## 2024-03-30 NOTE — Telephone Encounter (Signed)
 Patient walked in having pain in back that goes down side on left.  Stated when asked if it was in his chest..  At first said yes then during conversation stated that it was more in his back.  Has taken some meds for gas.  Stated this does not help. Stated that the pain os during the day when he moves about.  Is ok when he changes positions and does not remain in 1 spot for a long time. Was informed that we have no appointments this morning due to the time and no available appointments this afternoon. Told could go to the ER or to Urgent Care if the pain worsens.  Able to drink ice water x 2 will here. Took appointment for Wednesday.  Will call if any openings become available before Wednesday.

## 2024-03-30 NOTE — Telephone Encounter (Signed)
 FYI Only or Action Required?: FYI only for provider: ED advised.  Patient was last seen in primary care on 03/26/2024 by Elicia Sharper, DO.  Called Nurse Triage reporting Shoulder Pain.  Symptoms began several days ago.  Interventions attempted: OTC medications: GasX and Rest, hydration, or home remedies.  Symptoms are: unchanged.  Triage Disposition: Go to ED Now (or PCP Triage)  Patient/caregiver understands and will follow disposition?: Yes plans to start at urgent care   Copied from CRM #[id number]. Topic: Clinical - Red Word Triage >> Mar 30, 2024 10:09 AM Miquel SAILOR wrote: Red Word that prompted transfer to Nurse Triage: Shoulder pain sever LT since 3-4 day Reason for Disposition  [1] SEVERE pain AND [2] not improved 2 hours after pain medicine  Answer Assessment - Initial Assessment Questions Additional info: Patient reports non traumatic left shoulder pain started 3-4 days ago, he thought it may be gas and tried otc medication without relief. He rates pain as 9/10 and worse with movement and sitting upright. Patient plans to proceed to urgent care today for evaluation.     1. ONSET: When did the pain start?     3-4 days  2. LOCATION: Where is the pain located?     Left shoulder  3. PAIN: How bad is the pain? (Scale 1-10; or mild, moderate, severe)     Aching 9/10 4. WORK OR EXERCISE: Has there been any recent work or exercise that involved this part of the body?     no 5. CAUSE: What do you think is causing the shoulder pain?     unsure 6. OTHER SYMPTOMS: Do you have any other symptoms? (e.g., neck pain, swelling, rash, fever, numbness, weakness)     Difficulty driving due to pain.  Protocols used: Shoulder Pain-A-AH

## 2024-03-31 ENCOUNTER — Inpatient Hospital Stay: Admitting: Internal Medicine

## 2024-03-31 ENCOUNTER — Telehealth: Payer: Self-pay | Admitting: *Deleted

## 2024-03-31 NOTE — Telephone Encounter (Signed)
 PCS form faxed to [facility]  03-26-2024 @ 14:04 pm  (fax [phone]   / phone-[phone]) patient contacted and aware of the their to contact him about assessment appointment.

## 2024-03-31 NOTE — Telephone Encounter (Signed)
 Called and left vm on pt personal cell and pt sister cell (Arlene) for pt to call and be rescheduled for missed appt.

## 2024-03-31 NOTE — Telephone Encounter (Signed)
 Patient was identified as falling into the [place] Measure - Diabetes.   Patient was: Appointment already scheduled for:  04/01/24.

## 2024-04-01 ENCOUNTER — Inpatient Hospital Stay (HOSPITAL_BASED_OUTPATIENT_CLINIC_OR_DEPARTMENT_OTHER): Admitting: Internal Medicine

## 2024-04-01 ENCOUNTER — Ambulatory Visit: Admitting: Student

## 2024-04-01 VITALS — BP 130/67 | HR 95 | Temp 99.6°F | Ht 72.0 in | Wt 187.2 lb

## 2024-04-01 VITALS — BP 118/64 | HR 90 | Temp 97.7°F | Resp 17 | Ht 72.0 in | Wt 183.0 lb

## 2024-04-01 DIAGNOSIS — I2510 Atherosclerotic heart disease of native coronary artery without angina pectoris: Secondary | ICD-10-CM | POA: Diagnosis not present

## 2024-04-01 DIAGNOSIS — C3492 Malignant neoplasm of unspecified part of left bronchus or lung: Secondary | ICD-10-CM | POA: Diagnosis not present

## 2024-04-01 DIAGNOSIS — C7931 Secondary malignant neoplasm of brain: Secondary | ICD-10-CM | POA: Diagnosis not present

## 2024-04-01 DIAGNOSIS — M25512 Pain in left shoulder: Secondary | ICD-10-CM

## 2024-04-01 DIAGNOSIS — G40209 Localization-related (focal) (partial) symptomatic epilepsy and epileptic syndromes with complex partial seizures, not intractable, without status epilepticus: Secondary | ICD-10-CM | POA: Diagnosis not present

## 2024-04-01 DIAGNOSIS — M170 Bilateral primary osteoarthritis of knee: Secondary | ICD-10-CM | POA: Diagnosis not present

## 2024-04-01 DIAGNOSIS — C3490 Malignant neoplasm of unspecified part of unspecified bronchus or lung: Secondary | ICD-10-CM

## 2024-04-01 DIAGNOSIS — E785 Hyperlipidemia, unspecified: Secondary | ICD-10-CM | POA: Diagnosis not present

## 2024-04-01 DIAGNOSIS — M47816 Spondylosis without myelopathy or radiculopathy, lumbar region: Secondary | ICD-10-CM | POA: Diagnosis not present

## 2024-04-01 DIAGNOSIS — I5032 Chronic diastolic (congestive) heart failure: Secondary | ICD-10-CM | POA: Diagnosis not present

## 2024-04-01 MED ORDER — CYCLOBENZAPRINE HCL 5 MG PO TABS: 5.0000 mg | ORAL_TABLET | Freq: Every evening | ORAL | 0 refills | Status: DC | PRN

## 2024-04-01 NOTE — Patient Instructions (Addendum)
 For over the counter pain remedies, try acetaminophen , diclofenac  gel, lidocaine  gel. Ibuprofen is okay as long as you don't use it for prolonged periods.  Remember to bring all of the medications that you take (including over the counter medications and supplements) with you to every clinic visit.  This after visit summary is an important review of tests, referrals, and medication changes that were discussed during your visit. If you have questions or concerns, call [phone]. Outside of clinic business hours, call the main hospital at [phone] and ask the operator for the on-call internal medicine resident.   Ozell Kung MD 04/01/2024, 10:08 AM

## 2024-04-01 NOTE — Progress Notes (Signed)
 [facility] [phone]   [phone]  OFFICE PROGRESS NOTE  Jared Sharper, DO [address] [place], [address] [place] [address]  DIAGNOSIS: Stage IV (TX, N2, M1 C) non-small cell lung cancer, adenocarcinoma diagnosed in August 2021 and presented with solitary brain metastasis in addition to mediastinal lymphadenopathy.   PDL1 Expression 70%   Molecular Biomarkers:  Tumor Mutational Burden - 52 Muts/Mb Microsatellite status - MS-Stable Genomic Findings For a complete list of the genes assayed, please refer to the Appendix. NF1 E1694* MTAP loss exons 2-8 RICTOR amplification ATRX A419V BRAF K483E CDKN2A/B CDKN2A loss, CDKN2B loss DNMT3A E205* FGF10 amplification NTRK1 amplification - equivocal? 7 Disease relevant genes with no reportable alterations: ALK, EGFR, ERBB2, KRAS, MET, RET, ROS1   PRIOR THERAPY: 1) Status post right craniotomy with tumor resection followed by [facility] to solitary brain metastasis under the care of Dr. Dewey and Dr. Mima. 2) Concurrent chemoradiation with weekly carboplatin  for AUC of 2 and paclitaxel  45 mg/M2.  First dose 02/01/2020. Status post 5 cycles.  Last dose was given February 29, 2020. 3) First-line treatment with immunotherapy with Libtayo  (Cempilimab) 350 mg IV every 3 weeks. Last dose on 04/10/22.  Status post 35 cycles.    CURRENT THERAPY: Observation  INTERVAL HISTORY: Jared Shea. 68 y.o. male returns to the clinic today for follow-up visit. Discussed the use of AI scribe software for clinical note transcription with the patient, who gave verbal consent to proceed.  History of Present Illness Jared Shea. is a 68 year old male with stage four non-small cell lung cancer who presents for evaluation and repeat CT scan for restaging of his disease.  He was diagnosed with stage four non-small cell lung cancer, adenocarcinoma, in August 2021 with a PD-L1 expression of 70%. There are no actionable  mutations. His treatment history includes a right craniotomy and tumor resection from the brain followed by stereotactic radiosurgery [facility]). This was followed by concurrent chemoradiation for locally advanced disease in the lung and two years of single-agent immunotherapy with leqembi, completed in November 2023. He has been on observation since then.  He experiences pain in his left shoulder, describing it as 'good grace, it hurts'. He has taken medications for gas and BC powder for relief. No recent issues with arthritis in his legs, stating 'I ain't had no trouble out of that'.     MEDICAL HISTORY: Past Medical History:  Diagnosis Date   Adjustment disorder with depressed mood 01/06/2020   At risk for delirium 02/12/2024   Bilateral primary osteoarthritis of knee 12/06/2021   Brain mass    brain cancer   Bursitis of right hip    CAD (coronary artery disease) 12/29/2023   Chest pain 08/12/2018   Chronic cough    Chronic hepatitis C without hepatic coma (HCC) 09/12/2023   Identified on routine screening 09/2023  Genotype 1a (10-2023)              VL 1.6 millioin     Cognitive impairment 03/14/2021   Complex partial seizures (HCC) - first manifestation of brain mets 2021 12/09/2019   Dependence on nicotine  from cigarettes    Erectile dysfunction 07/11/2021   Frequent headaches 06/27/2020   Heart failure with recovered ejection fraction (HFrecEF) (HCC) 12/29/2023   History of diabetic ketoacidosis 12/28/2023   1st episode 12/2024 in setting of sudden poor control (medication non-adherence suspected). Insulin  initiated.     History of lung adenocarcinoma metastatic to  brain    History of primary non-small cell carcinoma of left lung 12/31/2019   S/p right frontal craniotomy with tumor resection on 8/26     Hyperlipidemia 02/19/2023   Current Medications: atorvastatin  20mg   Intolerances: None  Risk Factors:CAD, DM, HTN   LDL-C goal: <55     Hypertension associated with type 2 diabetes  mellitus (HCC) 07/28/2018   Partial symptomatic epilepsy with complex partial seizures, not intractable, without status epilepticus (HCC) 12/09/2019   Port-A-Cath in place 07/05/2020   PVC (premature ventricular contraction) 05/21/2022   Abnormal rhythm noted on cardiac auscultation and EKG obtained that showed sinus rhythm with premature ventricular complexes. Significant PVC burden with around 30 % on EKG. Has albuterol  but uses it very sparingly with few times a month.      Seizures (HCC)    Thyroid  nodule 01/04/2020   See Non-small cell carcinoma of lung.     01/11/2020 US : The hypermetabolic lesion identified on the prior PET-CT corresponds  with a 1.3 cm TI-RADS category 4 nodule in the deep aspect of the  right mid gland. Hypermetabolic activity on PET is associated with  an approximately 40% malignancy rate and supersedes the TI-RADS  classification system. Recommend further evaluation with fine-needle  asp   Uncontrolled type 2 diabetes mellitus with hyperglycemia, without long-term current use of insulin  (HCC) 01/06/2020    ALLERGIES:  is allergic to jardiance  [empagliflozin ] and penicillins.  MEDICATIONS:  Current Outpatient Medications  Medication Sig Dispense Refill   aspirin  EC 81 MG tablet Take 1 tablet (81 mg total) by mouth daily. Swallow whole.     atorvastatin  (LIPITOR) 20 MG tablet Take 1 tablet (20 mg total) by mouth daily. 90 tablet 3   benzocaine  (ORAJEL) 10 % mucosal gel Use as directed 1 Application in the mouth or throat as needed for mouth pain. 5.3 g 0   Blood Glucose Monitoring Suppl (BLOOD GLUCOSE MONITOR SYSTEM) w/Device KIT Use as directed to check blood sugar three times daily 1 kit 0   Continuous Glucose Sensor (DEXCOM G7 SENSOR) MISC Place new sensor every 10 days. Use to monitor blood sugar continuously. 9 each 3   glucose blood (ACCU-CHEK GUIDE TEST) test strip USE A NEW STRIP EACH TIME TO CHECK YOUR BLOOD SUGAR. 200 strip 10   insulin  glargine (LANTUS ) 100  UNIT/ML Solostar Pen Inject 10 Units into the skin daily. 15 mL 0   Insulin  Pen Needle (PEN NEEDLES) 31G X 5 MM MISC Use to inject insulin  three times daily as directed 100 each 3   isosorbide  mononitrate (IMDUR ) 60 MG 24 hr tablet Take 60 mg by mouth daily.     Lancet Device MISC 1 each by Does not apply route 3 (three) times daily. May dispense any manufacturer covered by patient's insurance. 1 each 0   Lancets MISC Use to check blood sugar three times daily as directed 100 each 0   levETIRAcetam  (KEPPRA ) 500 MG tablet Take 1 tablet (500 mg total) by mouth 2 (two) times daily. 60 tablet 11   losartan -hydrochlorothiazide  (HYZAAR) 50-12.5 MG tablet Take 1 tablet by mouth daily. 90 tablet 3   metFORMIN  (GLUCOPHAGE ) 500 MG tablet Take 1 tablet (500 mg total) by mouth 2 (two) times daily with a meal. 180 tablet 3   metoprolol  succinate (TOPROL  XL) 25 MG 24 hr tablet Take 0.5 tablets (12.5 mg total) by mouth at bedtime. 90 tablet 3   Sofosbuvir -Velpatasvir  (EPCLUSA ) 400-100 MG TABS Take 1 tablet by mouth daily.  No current facility-administered medications for this visit.    SURGICAL HISTORY:  Past Surgical History:  Procedure Laterality Date   APPLICATION OF CRANIAL NAVIGATION N/A 01/07/2020   Procedure: APPLICATION OF CRANIAL NAVIGATION;  Surgeon: Gillie Duncans, MD;  Location: [facility] OR;  Service: Neurosurgery;  Laterality: N/A;   CRANIOTOMY Right 01/07/2020   Procedure: RIGHT CRANIOTOMY FOR TUMOR RESECTION;  Surgeon: Gillie Duncans, MD;  Location: [facility] OR;  Service: Neurosurgery;  Laterality: Right;  rm 21   ENDOBRONCHIAL ULTRASOUND N/A 12/21/2019   Procedure: ENDOBRONCHIAL ULTRASOUND;  Surgeon: Neda Jennet LABOR, MD;  Location: [facility] END[facility]COPY;  Service: Pulmonary;  Laterality: N/A;   FINE NEEDLE ASPIRATION  12/21/2019   Procedure: FINE NEEDLE ASPIRATION (FNA) LINEAR;  Surgeon: Neda Jennet LABOR, MD;  Location: [facility];  Service: Pulmonary;;   IR IMAGING GUIDED PORT INSERTION  03/03/2020    VIDEO BRONCHOSCOPY N/A 12/21/2019   Procedure: VIDEO BRONCHOSCOPY WITHOUT FLUORO;  Surgeon: Neda Jennet LABOR, MD;  Location: [facility];  Service: Pulmonary;  Laterality: N/A;    REVIEW OF SYSTEMS:  A comprehensive review of systems was negative except for: Constitutional: positive for fatigue Musculoskeletal: positive for arthralgias   PHYSICAL EXAMINATION: General appearance: alert, cooperative, appears stated age, and no distress Head: Normocephalic, without obvious abnormality, atraumatic Neck: no adenopathy, no JVD, supple, symmetrical, trachea midline, and thyroid  not enlarged, symmetric, no tenderness/mass/nodules Lymph nodes: Cervical, supraclavicular, and axillary nodes normal. Resp: clear to auscultation bilaterally Back: symmetric, no curvature. ROM normal. No CVA tenderness. Cardio: regular rate and rhythm, S1, S2 normal, no murmur, click, rub or gallop GI: soft, non-tender; bowel sounds normal; no masses,  no organomegaly Extremities: extremities normal, atraumatic, no cyanosis or edema  ECOG PERFORMANCE STATUS: 1 - Symptomatic but completely ambulatory  Blood pressure 118/64, pulse 90, temperature 97.7 F (36.5 C), temperature source Temporal, resp. rate 17, height 6' (1.829 m), weight 183 lb (83 kg), SpO2 100%.  LABORATORY DATA: Lab Results  Component Value Date   WBC 7.0 03/27/2024   HGB 12.5 (L) 03/27/2024   HCT 37.0 (L) 03/27/2024   MCV 87.5 03/27/2024   PLT 399 03/27/2024      Chemistry      Component Value Date/Time   NA 139 03/27/2024 1101   NA 143 09/05/2023 1015   K 4.0 03/27/2024 1101   CL 105 03/27/2024 1101   CO2 28 03/27/2024 1101   BUN 16 03/27/2024 1101   BUN 16 09/05/2023 1015   CREATININE 1.03 03/27/2024 1101      Component Value Date/Time   CALCIUM  9.5 03/27/2024 1101   ALKPHOS 64 03/27/2024 1101   AST 11 (L) 03/27/2024 1101   ALT 7 03/27/2024 1101   BILITOT 0.6 03/27/2024 1101       RADIOGRAPHIC STUDIES: CT CHEST ABDOMEN  PELVIS W CONTRAST Result Date: 03/30/2024 EXAM: CT CHEST, ABDOMEN AND PELVIS WITH CONTRAST 03/27/2024 12:26:54 PM TECHNIQUE: CT of the chest, abdomen and pelvis was performed with the administration of 100 mL of iohexol  (OMNIPAQUE ) 300 MG/ML solution intravenous contrast. Multiplanar reformatted images are provided for review. Automated exposure control, iterative reconstruction, and/or weight based adjustment of the mA/kV was utilized to reduce the radiation dose to as low as reasonably achievable. COMPARISON: 12/28/2023 CLINICAL HISTORY: Metastatic non small cell lung cancer. * Tracking Code: BO * FINDINGS: CHEST: MEDIASTINUM AND LYMPH NODES: Port-a-cath tip in the upper SVC. Left anterior descending and circumflex coronary artery atherosclerosis. Left cervical navicular noted 0.7 cm in short axis, stable. No overtly pathologic adenopathy in  the chest observed. Heart and pericardium are otherwise unremarkable. The central airways are clear. LUNGS AND PLEURA: Centrilobular emphysema. Mild scarring anteriorly in the left upper lobe. Small fiducial anteriorly in the left upper lobe. No focal consolidation or pulmonary edema. No pleural effusion or pneumothorax. ABDOMEN AND PELVIS: LIVER: The liver is unremarkable. GALLBLADDER AND BILE DUCTS: Gallbladder is unremarkable. No biliary ductal dilatation. SPLEEN: No acute abnormality. PANCREAS: No acute abnormality. ADRENAL GLANDS: No acute abnormality. KIDNEYS, URETERS AND BLADDER: No stones in the kidneys or ureters. No hydronephrosis. No perinephric or periureteral stranding. Urinary bladder is unremarkable. GI AND BOWEL: Stomach demonstrates no acute abnormality. Mild sigmoid colon diverticulosis. Prominence of stool throughout the colon, favoring constipation. There is no bowel obstruction. REPRODUCTIVE ORGANS: No acute abnormality. PERITONEUM AND RETROPERITONEUM: No ascites. No free air. VASCULATURE: Aorta is normal in caliber. Systemic atherosclerosis is present,  including the aorta and iliac arteries. ABDOMINAL AND PELVIS LYMPH NODES: No lymphadenopathy. BONES AND SOFT TISSUES: Lumbar spondylitis and degenerative disc disease contributing to bilateral foraminal impingement at L4-L5. No acute osseous abnormality. No focal soft tissue abnormality. IMPRESSION: 1. No evidence of active metastatic disease in the chest, abdomen, or pelvis. 2. Centrilobular emphysema. 3. Stable 0.7 cm left cervical lymph node without pathologic thoracic adenopathy. 4. Systemic atherosclerosis, including aorta and iliac arteries. 5. Lumbar spondylosis and degenerative disc disease with bilateral foraminal impingement at L4-5. 6. Mild sigmoid colon diverticulosis without diverticulitis; stool burden suggesting constipation. Electronically signed by: Ryan Salvage MD 03/30/2024 08:43 AM EST RP Workstation: [id number]    ASSESSMENT AND PLAN: This is a very pleasant 68 years old African-American male with history of stage IV non-small cell lung cancer, adenocarcinoma diagnosed in August 2021 with solitary brain metastasis and PD-L1 expression of 70%.  He has no actionable mutations.  He started treatment with a right craniotomy and tumor resection followed by [facility].  The patient then started a course of concurrent chemoradiation with weekly carboplatin  and paclitaxel  followed by first-line immunotherapy with Libtayo  (Cempilimab) 350 Mg IV every 3 weeks for 2 years.  He tolerated his treatment fairly well except for arthralgia. He has been in observation now since November 2023 with no evidence for disease progression. He had repeat CT scan of the chest, abdomen and pelvis performed recently.  I personally independently reviewed the scan and discussed the results with the patient today.  His scan showed no concerning findings for disease progression. Assessment and Plan Assessment & Plan Stage IV non-small cell lung cancer with prior brain metastasis Stage IV non-small cell lung cancer,  adenocarcinoma type, diagnosed in August 2021 with PD-L1 expression of 70%. No actionable mutations. Treated with right craniotomy and tumor resection from the brain followed by [facility]. Received concurrent chemoradiation to locally advanced disease in the lung and two years of single-agent immunotherapy with leqembi, completed in November 2023. Currently on observation. Recent CT scan of the chest, abdomen, and pelvis shows no evidence of disease progression or metastasis. - Continue observation. - Scheduled follow-up in six months.  Lumbar spondylosis and degenerative disc disease Degenerative disc disease and lumbar spondylosis noted on imaging. Reports left shoulder pain and back pain, possibly related to arthritis. No acute concerning findings on imaging. - Consult family doctor or orthopedic surgeon for management of shoulder and back pain. The patient was advised to call immediately if he has any concerning symptoms in the interval. The patient voices understanding of current disease status and treatment options and is in agreement with the current care plan.  All questions were answered. The patient knows to call the clinic with any problems, questions or concerns. We can certainly see the patient much sooner if necessary.  The total time spent in the appointment was 20 minutes including review of chart and various tests results, discussions about plan of care and coordination of care plan .   Disclaimer: This note was dictated with voice recognition software. Similar sounding words can inadvertently be transcribed and may not be corrected upon review.

## 2024-04-01 NOTE — Progress Notes (Unsigned)
 Patient name: Jared Shea. Date of birth: 12-07-1955 Date of visit: 04/02/24  Subjective:  Reason for visit: Acute Visit  (Right Shoulder pain radiating to the back. )  Back Pain This is a new problem. Episode onset: 3 weeks ago after some gardening. Episode frequency: constant, worse at night. The problem has been gradually worsening since onset. Pain location: left infrascapular. The quality of the pain is described as stabbing. The pain does not radiate. The pain is severe. The pain is Worse during the night. The symptoms are aggravated by twisting. Risk factors include history of cancer. Treatments tried: uknown skeletal muscle relaxant from friend, BC powder. The treatment provided no relief.     Current Outpatient Medications  Medication Instructions   aspirin  EC 81 mg, Oral, Daily, Swallow whole.   atorvastatin  (LIPITOR) 20 mg, Oral, Daily   benzocaine  (ORAJEL) 10 % mucosal gel 1 Application, Mouth/Throat, As needed   Blood Glucose Monitoring Suppl (BLOOD GLUCOSE MONITOR SYSTEM) w/Device KIT Use as directed to check blood sugar three times daily   Continuous Glucose Sensor (DEXCOM G7 SENSOR) MISC Place new sensor every 10 days. Use to monitor blood sugar continuously.   cyclobenzaprine  (FLEXERIL ) 5 mg, Oral, At bedtime PRN   glucose blood (ACCU-CHEK GUIDE TEST) test strip USE A NEW STRIP EACH TIME TO CHECK YOUR BLOOD SUGAR.   insulin  glargine (LANTUS ) 10 Units, Subcutaneous, Daily   Insulin  Pen Needle (PEN NEEDLES) 31G X 5 MM MISC Use to inject insulin  three times daily as directed   isosorbide  mononitrate (IMDUR ) 60 mg, Daily   Lancet Device MISC 1 each, Does not apply, 3 times daily, May dispense any manufacturer covered by patient's insurance.   Lancets MISC Use to check blood sugar three times daily as directed   levETIRAcetam  (KEPPRA ) 500 mg, Oral, 2 times daily   losartan -hydrochlorothiazide  (HYZAAR) 50-12.5 MG tablet 1 tablet, Oral, Daily   metFORMIN  (GLUCOPHAGE ) 500  mg, Oral, 2 times daily with meals   metoprolol  succinate (TOPROL  XL) 12.5 mg, Oral, Nightly   Sofosbuvir -Velpatasvir  (EPCLUSA ) 400-100 MG TABS 1 tablet, Daily    Objective: Today's Vitals   04/01/24 0939  BP: 130/67  Pulse: 95  Temp: 99.6 F (37.6 C)  TempSrc: Axillary  SpO2: 100%  Weight: 187 lb 3.2 oz (84.9 kg)  Height: 6' (1.829 m)  PainSc: 9   Body mass index is 25.39 kg/m.   Physical Exam Constitutional:      Appearance: Normal appearance.  Cardiovascular:     Rate and Rhythm: Normal rate and regular rhythm.     Heart sounds: No murmur heard. Pulmonary:     Effort: Pulmonary effort is normal. No respiratory distress.  Musculoskeletal:     Comments: Tenderness around left infrascapular region  Skin:    General: Skin is warm and dry.     Comments: No rashes present over painful area  Neurological:     Mental Status: He is alert.     Cranial Nerves: No facial asymmetry.  Psychiatric:        Mood and Affect: Affect normal.        Speech: Speech normal.        Behavior: Behavior normal.     Assessment and plan:  Assessment & Plan Subscapular pain, left Acute left infrascapular pain. Trigger may have been working in the garden. Reviewed CT from 03/27/24 for monitoring of his metastatic NSCLC, no findings in this area. Seems muscular, could be serratus or lat strain. Orders:   cyclobenzaprine  (FLEXERIL )  5 MG tablet; Take 1 tablet (5 mg total) by mouth at bedtime as needed for muscle spasms.   Ambulatory referral to Physical Therapy   Return if symptoms worsen or fail to improve.  Ozell Kung MD 04/02/2024, 5:53 AM

## 2024-04-03 NOTE — Progress Notes (Signed)
 Internal Medicine Clinic Attending  Case discussed with the resident at the time of the visit.  We reviewed the resident's history and exam and pertinent patient test results.  I agree with the assessment, diagnosis, and plan of care documented in the resident's note.

## 2024-04-06 ENCOUNTER — Ambulatory Visit: Admitting: Internal Medicine

## 2024-04-14 NOTE — Therapy (Deleted)
 OUTPATIENT PHYSICAL THERAPY SHOULDER EVALUATION   Patient Name: Jared Shea. MRN: [id number] DOB:1955/09/24, 68 y.o., male Today's Date: 04/14/2024  END OF SESSION:   Past Medical History:  Diagnosis Date   Adjustment disorder with depressed mood 01/06/2020   At risk for delirium 02/12/2024   Bilateral primary osteoarthritis of knee 12/06/2021   Brain mass    brain cancer   Bursitis of right hip    CAD (coronary artery disease) 12/29/2023   Chest pain 08/12/2018   Chronic cough    Chronic hepatitis C without hepatic coma (HCC) 09/12/2023   Identified on routine screening 09/2023  Genotype 1a (10-2023)              VL 1.6 millioin     Cognitive impairment 03/14/2021   Complex partial seizures (HCC) - first manifestation of brain mets 2021 12/09/2019   Dependence on nicotine  from cigarettes    Erectile dysfunction 07/11/2021   Frequent headaches 06/27/2020   Heart failure with recovered ejection fraction (HFrecEF) (HCC) 12/29/2023   History of diabetic ketoacidosis 12/28/2023   1st episode 12/2024 in setting of sudden poor control (medication non-adherence suspected). Insulin  initiated.     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    History of primary non-small cell carcinoma of left lung 12/31/2019   S/p right frontal craniotomy with tumor resection on 8/26     Hyperlipidemia 02/19/2023   Current Medications: atorvastatin  20mg   Intolerances: None  Risk Factors:CAD, DM, HTN   LDL-C goal: <55     Hypertension associated with type 2 diabetes mellitus (HCC) 07/28/2018   Partial symptomatic epilepsy with complex partial seizures, not intractable, without status epilepticus (HCC) 12/09/2019   Port-A-Cath in place 07/05/2020   PVC (premature ventricular contraction) 05/21/2022   Abnormal rhythm noted on cardiac auscultation and EKG obtained that showed sinus rhythm with premature ventricular complexes. Significant PVC burden with around 30 % on EKG. Has albuterol  but uses  it very sparingly with few times a month.      Seizures (HCC)    Thyroid  nodule 01/04/2020   See Non-small cell carcinoma of lung.     01/11/2020 US : The hypermetabolic lesion identified on the prior PET-CT corresponds  with a 1.3 cm TI-RADS category 4 nodule in the deep aspect of the  right mid gland. Hypermetabolic activity on PET is associated with  an approximately 40% malignancy rate and supersedes the TI-RADS  classification system. Recommend further evaluation with fine-needle  asp   Uncontrolled type 2 diabetes mellitus with hyperglycemia, without long-term current use of insulin  (HCC) 01/06/2020   Past Surgical History:  Procedure Laterality Date   APPLICATION OF CRANIAL NAVIGATION N/A 01/07/2020   Procedure: APPLICATION OF CRANIAL NAVIGATION;  Surgeon: Gillie Duncans, MD;  Location: [facility] OR;  Service: Neurosurgery;  Laterality: N/A;   CRANIOTOMY Right 01/07/2020   Procedure: RIGHT CRANIOTOMY FOR TUMOR RESECTION;  Surgeon: Gillie Duncans, MD;  Location: [facility] OR;  Service: Neurosurgery;  Laterality: Right;  rm 21   ENDOBRONCHIAL ULTRASOUND N/A 12/21/2019   Procedure: ENDOBRONCHIAL ULTRASOUND;  Surgeon: Neda Jennet LABOR, MD;  Location: [facility];  Service: Pulmonary;  Laterality: N/A;   FINE NEEDLE ASPIRATION  12/21/2019   Procedure: FINE NEEDLE ASPIRATION (FNA) LINEAR;  Surgeon: Neda Jennet LABOR, MD;  Location: [facility];  Service: Pulmonary;;   IR IMAGING GUIDED PORT INSERTION  03/03/2020   VIDEO BRONCHOSCOPY N/A 12/21/2019   Procedure: VIDEO BRONCHOSCOPY WITHOUT FLUORO;  Surgeon: Neda Jennet LABOR, MD;  Location: [facility];  Service:  Pulmonary;  Laterality: N/A;   Patient Active Problem List   Diagnosis Date Noted   Advance care planning 03/26/2024   At risk for delirium 02/12/2024   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    CAD (coronary artery disease) 12/29/2023   History of diabetic ketoacidosis 12/28/2023   Chronic hepatitis C without hepatic coma (HCC)  09/12/2023   Hyperlipidemia 02/19/2023   Heart failure with recovered ejection fraction (HFrecEF) (HCC) 08/20/2022   PVC (premature ventricular contraction) 05/21/2022   Bilateral primary osteoarthritis of knee 12/06/2021   Erectile dysfunction 07/11/2021   Cognitive impairment following brain surgery, XRT, chemo and immunotherapy for metastatic NSCLC 03/14/2021   Port-A-Cath in place 07/05/2020   Type 2 diabetes mellitus without complication, without long-term current use of insulin  (HCC) 01/06/2020   Thyroid  nodule 01/04/2020   History of primary non-small cell carcinoma of left lung 12/31/2019   Complex partial seizures (HCC) - first manifestation of brain mets 2021 12/09/2019   Hypertension associated with type 2 diabetes mellitus (HCC) 07/28/2018    PCP: Elicia Sharper, DO   REFERRING PROVIDER: Trudy Mliss Dragon, MD  REFERRING DIAG: [phone] (ICD-10-CM) - Subscapular pain, left  THERAPY DIAG:  No diagnosis found.  Rationale for Evaluation and Treatment: Rehabilitation  ONSET DATE: 5 weeks  SUBJECTIVE:                                                                                                                                                                                      SUBJECTIVE STATEMENT: Reason for visit: Acute Visit  (Right Shoulder pain radiating to the back. )   Back Pain This is a new problem. Episode onset: 3 weeks ago after some gardening. Episode frequency: constant, worse at night. The problem has been gradually worsening since onset. Pain location: left infrascapular. The quality of the pain is described as stabbing. The pain does not radiate. The pain is severe. The pain is Worse during the night. The symptoms are aggravated by twisting. Risk factors include history of cancer. Treatments tried: uknown skeletal muscle relaxant from friend, BC powder. The treatment provided no relief.  Hand dominance: {MISC; OT HAND [phone]}  PERTINENT  HISTORY: Subscapular pain, left Acute left infrascapular pain. Trigger may have been working in the garden. Reviewed CT from 03/27/24 for monitoring of his metastatic NSCLC, no findings in this area. Seems muscular, could be serratus or lat strain. Orders:   cyclobenzaprine  (FLEXERIL ) 5 MG tablet; Take 1 tablet (5 mg total) by mouth at bedtime as needed for muscle spasms.   Ambulatory referral to Physical Therapy  PAIN:  Are you having pain? Yes: NPRS scale: *** Pain location: *** Pain  description: *** Aggravating factors: *** Relieving factors: ***  PRECAUTIONS: None  RED FLAGS: None   WEIGHT BEARING RESTRICTIONS: No  FALLS:  Has patient fallen in last 6 months? No  OCCUPATION: retired  PLOF: Independent  PATIENT GOALS:To manage my shoulder pain  NEXT MD VISIT:   OBJECTIVE:  Note: Objective measures were completed at Evaluation unless otherwise noted.  DIAGNOSTIC FINDINGS:  none  PATIENT SURVEYS:  Quick Dash:  QUICK DASH  Please rate your ability do the following activities in the last week by selecting the number below the appropriate response.   Activities Rating  Open a tight or new jar.  {Q-dash (1-6):32984}  Do heavy household chores (e.g., wash walls, floors). {Q-dash DEVELOPMENT WORKER, COMMUNITY  Carry a shopping bag or briefcase {Q-dash [facility] your back. {Q-dash (1-6):32984}  Use a knife to cut food. {Q-dash (1-6):32984}  Recreational activities in which you take some force or impact through your arm, shoulder or hand (e.g., golf, hammering, tennis, etc.). {Q-dash (1-6):32984}  During the past week, to what extent has your arm, shoulder or hand problem interfered with your normal social activities with family, friends, neighbors or groups?  {Q-Dash 7:32985}  During the past week, were you limited in your work or other regular daily activities as a result of your arm, shoulder or hand problem? {Q-dash 8:32988}  Rate the severity of the following symptoms in  the last week: Arm, Shoulder, or hand pain. {Q-dash 0-89:67013}  Rate the severity of the following symptoms in the last week: Tingling (pins and needles) in your arm, shoulder or hand. {Q-dash 0-89:67013}  During the past week, how much difficulty have you had sleeping because of the pain in your arm, shoulder or hand?  {Q-dash 11:32987}   (A QuickDASH score may not be calculated if there is greater than 1 missing item.)  Quick Dash Disability/Symptom Score: [(sum of *** (n) responses/*** (n)] x 25 = ***  Minimally Clinically Important Difference (MCID): 15-20 points  (Franchignoni, F. et al. (2013). Minimally clinically important difference of the disabilities of the arm, shoulder, and hand outcome measures (DASH) and its shortened version (Quick DASH). Journal of Orthopaedic & Sports Physical Therapy, 44(1), 30-39)   POSTURE: ***  UPPER EXTREMITY ROM:   {AROM/PROM:27142} ROM Right eval Left eval  Shoulder flexion    Shoulder extension    Shoulder abduction    Shoulder adduction    Shoulder internal rotation    Shoulder external rotation    Elbow flexion    Elbow extension    Wrist flexion    Wrist extension    Wrist ulnar deviation    Wrist radial deviation    Wrist pronation    Wrist supination    (Blank rows = not tested)  UPPER EXTREMITY MMT:  MMT Right eval Left eval  Shoulder flexion    Shoulder extension    Shoulder abduction    Shoulder adduction    Shoulder internal rotation    Shoulder external rotation    Middle trapezius    Lower trapezius    Elbow flexion    Elbow extension    Wrist flexion    Wrist extension    Wrist ulnar deviation    Wrist radial deviation    Wrist pronation    Wrist supination    Grip strength (lbs)    (Blank rows = not tested)  SHOULDER SPECIAL TESTS: Impingement tests: {shoulder impingement test:25231:a} SLAP lesions: {SLAP lesions:25232} Instability tests: {shoulder instability test:25233} Rotator cuff assessment:  {rotator  cuff assessment:25234} Biceps assessment: {biceps assessment:25235}  PALPATION:  ***                                                                                                                             TREATMENT DATE: *** [facility] Adult PT Treatment:                                                DATE: *** Therapeutic Exercise: *** Manual Therapy: *** Neuromuscular re-ed: *** Therapeutic Activity: *** Modalities: *** Self Care: ***   PATIENT EDUCATION: Education details: Discussed eval findings, rehab rationale and POC and patient is in agreement  Person educated: Patient Education method: Explanation and Handouts Education comprehension: verbalized understanding and needs further education  HOME EXERCISE PROGRAM: ***  ASSESSMENT:  CLINICAL IMPRESSION: Patient is a *** y.o. *** who was seen today for physical therapy evaluation and treatment for ***.   OBJECTIVE IMPAIRMENTS: {opptimpairments:25111}.   ACTIVITY LIMITATIONS: {activitylimitations:27494}  PARTICIPATION LIMITATIONS: {participationrestrictions:25113}  PERSONAL FACTORS: {Personal factors:25162} are also affecting patient's functional outcome.   REHAB POTENTIAL: Good  CLINICAL DECISION MAKING: Stable/uncomplicated  EVALUATION COMPLEXITY: Moderate   GOALS: Goals reviewed with patient? No  SHORT TERM GOALS: Target date: ***  Patient to demonstrate independence in HEP  Baseline: Goal status: INITIAL  2.  *** Baseline:  Goal status: INITIAL  3.  *** Baseline:  Goal status: INITIAL  4.  *** Baseline:  Goal status: INITIAL  5.  *** Baseline:  Goal status: INITIAL  6.  *** Baseline:  Goal status: INITIAL  LONG TERM GOALS: Target date: ***  Patient will acknowledge ***/10 pain at least once during episode of care   Baseline:  Goal status: INITIAL  2.  Patient will score at least ***% on FOTO to signify clinically meaningful improvement in functional abilities.    Baseline:  Goal status: INITIAL  3.  *** Baseline:  Goal status: INITIAL  4.  *** Baseline:  Goal status: INITIAL  5.  *** Baseline:  Goal status: INITIAL  6.  *** Baseline:  Goal status: INITIAL  PLAN:  PT FREQUENCY: 1-2x/week  PT DURATION: 6 weeks  PLANNED INTERVENTIONS: 97110-Therapeutic exercises, 97530- Therapeutic activity, 97112- Neuromuscular re-education, 97535- Self Care, 02859- Manual therapy, 20560 (1-2 muscles), 20561 (3+ muscles)- Dry Needling, and Patient/Family education  PLAN FOR NEXT SESSION: HEP review and update, manual techniques as appropriate, aerobic tasks, ROM and flexibility activities, strengthening and PREs, TPDN, gait and balance training,aquatic therapy, modalities for pain and NMRE      Reyes CHRISTELLA Kohut, PT 04/14/2024, 12:40 PM

## 2024-04-14 NOTE — Progress Notes (Signed)
 Internal Medicine Clinic Attending  Case discussed with the resident at the time of the visit.  We reviewed the resident's history and exam and pertinent patient test results.  I agree with the assessment, diagnosis, and plan of care documented in the resident's note.

## 2024-04-15 ENCOUNTER — Ambulatory Visit: Attending: Internal Medicine

## 2024-04-16 ENCOUNTER — Institutional Professional Consult (permissible substitution): Admitting: Neurology

## 2024-04-23 ENCOUNTER — Ambulatory Visit: Admitting: Student

## 2024-04-28 ENCOUNTER — Ambulatory Visit (INDEPENDENT_AMBULATORY_CARE_PROVIDER_SITE_OTHER)

## 2024-04-28 VITALS — BP 124/72 | HR 86 | Temp 97.6°F | Ht 72.0 in | Wt 183.6 lb

## 2024-04-28 DIAGNOSIS — G40209 Localization-related (focal) (partial) symptomatic epilepsy and epileptic syndromes with complex partial seizures, not intractable, without status epilepticus: Secondary | ICD-10-CM

## 2024-04-28 DIAGNOSIS — M25512 Pain in left shoulder: Secondary | ICD-10-CM

## 2024-04-28 DIAGNOSIS — E785 Hyperlipidemia, unspecified: Secondary | ICD-10-CM

## 2024-04-28 DIAGNOSIS — I152 Hypertension secondary to endocrine disorders: Secondary | ICD-10-CM | POA: Diagnosis not present

## 2024-04-28 DIAGNOSIS — E1159 Type 2 diabetes mellitus with other circulatory complications: Secondary | ICD-10-CM

## 2024-04-28 DIAGNOSIS — E119 Type 2 diabetes mellitus without complications: Secondary | ICD-10-CM

## 2024-04-28 DIAGNOSIS — Z7984 Long term (current) use of oral hypoglycemic drugs: Secondary | ICD-10-CM

## 2024-04-28 MED ORDER — ATORVASTATIN CALCIUM 20 MG PO TABS: 20.0000 mg | ORAL_TABLET | Freq: Every day | ORAL | 3 refills | Status: AC

## 2024-04-28 MED ORDER — CYCLOBENZAPRINE HCL 5 MG PO TABS: 5.0000 mg | ORAL_TABLET | Freq: Every evening | ORAL | 0 refills | Status: AC | PRN

## 2024-04-28 NOTE — Patient Instructions (Addendum)
 Thank you, Jared Shea. for allowing us  to provide your care today. Today we discussed the following:  - Great job on your diabetes control! Please continue to take Metformin  1000 mg twice daily - Your blood pressure looks great! Continue taking Metoprolol  12.5 mg daily and Losartan -hydrochlorothiazide  50-12.5 mg daily - Please continue to take your Keppra  (seizure medication) per Dr. Buckley, 500 mg twice daily  - I sent a refill of your Lipitor (cholesterol medication) and Flexeril  (muscle relaxer)   Referrals ordered today:    Referral Orders         Ambulatory referral to Ophthalmology      I have ordered the following medication/changed the following medications:   Stop the following medications: Medications Discontinued During This Encounter  Medication Reason   insulin  glargine (LANTUS ) 100 UNIT/ML Solostar Pen    atorvastatin  (LIPITOR) 20 MG tablet Reorder   Sofosbuvir -Velpatasvir  (EPCLUSA ) 400-100 MG TABS    Insulin  Pen Needle (PEN NEEDLES) 31G X 5 MM MISC    benzocaine  (ORAJEL) 10 % mucosal gel      Start the following medications: Meds ordered this encounter  Medications   atorvastatin  (LIPITOR) 20 MG tablet    Sig: Take 1 tablet (20 mg total) by mouth daily.    Dispense:  90 tablet    Refill:  3     Follow up: 3-4 months    Should you have any questions or concerns please call the Internal Medicine Clinic at [phone].    Have a Happy Holidays!  Doyal Miyamoto, MD [facility]

## 2024-04-28 NOTE — Assessment & Plan Note (Signed)
 BP in the office today 146/63, and 124/72 on manual repeat.  He reports complete adherence to metoprolol  12.5 mg daily and losartan -HCTZ 50-12.5 mg daily.  He reports that he does not take Imdur  60 mg daily.  He states that he did take all of his medications this morning.  He denies any episodes of lightheadedness, dizziness, or headaches.  BMP obtained 12/2023 with normal renal function, and recommend that he continue metoprolol  and losartan -hydrochlorothiazide . - Continue Metoprolol  12.5 mg daily - Continue Losartan -hydrochlorothiazide  50-12.5 mg daily

## 2024-04-28 NOTE — Assessment & Plan Note (Signed)
 Has a history of complex partial seizures secondary to brain metastases from 2021, for which he takes Keppra  500 mg BID. Today he asked whether he needs to continue taking Keppra  500 mg BID, and per chart review, Dr. Eward note from 02/2024 indicated continuing Keppra  500 mg BID for seizure prophylaxis and at that time sent a 1 year supply refill to his pharmacy.  I advised that I do recommend he continue this and obtain a refill from his pharmacy as the bottle he brought in today with his medications is completely empty. - Continue Keppra  500 mg BID

## 2024-04-28 NOTE — Assessment & Plan Note (Signed)
 Patient has a history of CAD, DMII, and HTN.  Last LDL was 29 on 04/2023.  He has an LDL goal of <55.  He reports complete adherence to atorvastatin  20 mg daily, and I will send refills to his pharmacy.  Discussed obtaining lipid panel, though he would like to defer to his next visit. - Continue Atorvastatin  20 mg daily - f/u in 3 months, obtain lipid panel at that visit

## 2024-04-28 NOTE — Progress Notes (Signed)
 Patient name: Jared Shea. Date of birth: 14-Jul-1955 Date of visit: 04/28/2024  Type of visit: Established Patient Office Visit   Subjective   Chief concern:  Chief Complaint  Patient presents with   Follow-up    Jared Shea. is a 68 y.o. male with a history of NSCLC, DMII, HTN, CAD, HLD, HFrecEF, chronic Hepatitis C, complex partial seizures 2/2 brain metastases, who presents to [facility] clinic for follow up of chronic conditions.  Patient was last seen in the [facility] by Dr. Norrine on 04/01/2024 for left subscapular pain.  He was also seen by Dr. Elicia on 03/26/2024 for diabetes.  His A1c at that time had improved to 6.0 from 13.1 in 12/2023.  As such, he was stopped on insulin , and increased metformin  to max dose as tolerated.  His last urine ACR was completed in 01/2024 and his last eye exam was completed in 02/2023.  LDL in 04/2023 was 29, and he had declined obtaining a lipid panel at his last office visit.  He was also seen at that time for hypertension which was uncontrolled, and reported taking metoprolol  12.5 mg daily and losartan -HCTZ 50-12.5 mg daily.  He was asked to continue his medications and follow-up in 1 month.  Today he reports that he takes Metformin  1000 mg BID. He has been taking Metoprolol  12.5 mg daily and Losartan -hydrochlorothiazide  50-12.5 mg daily. BP today is 146/63 and 124/72 on repeat. He denies any light headedness or dizziness. He denies and headaches.   He asked whether he needs to continue taking Keppra  500 mg BID, and per chart review, Dr. Eward note from 02/2024 indicated continuing Keppra  500 mg BID for seizure prophylaxis and at that time sent a 1 year supply refill to his pharmacy.   ROS: Negative unless otherwise listed in the HPI.  Patient Active Problem List   Diagnosis Date Noted   Advance care planning 03/26/2024   At risk for delirium 02/12/2024   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    CAD (coronary artery disease)  12/29/2023   History of diabetic ketoacidosis 12/28/2023   Chronic hepatitis C without hepatic coma (HCC) 09/12/2023   Hyperlipidemia 02/19/2023   Heart failure with recovered ejection fraction (HFrecEF) (HCC) 08/20/2022   PVC (premature ventricular contraction) 05/21/2022   Bilateral primary osteoarthritis of knee 12/06/2021   Erectile dysfunction 07/11/2021   Cognitive impairment following brain surgery, XRT, chemo and immunotherapy for metastatic NSCLC 03/14/2021   Port-A-Cath in place 07/05/2020   Type 2 diabetes mellitus without complication, without long-term current use of insulin  (HCC) 01/06/2020   Thyroid  nodule 01/04/2020   History of primary non-small cell carcinoma of left lung 12/31/2019   Complex partial seizures (HCC) - first manifestation of brain mets 2021 12/09/2019   Hypertension associated with type 2 diabetes mellitus (HCC) 07/28/2018     Past Surgical History:  Procedure Laterality Date   APPLICATION OF CRANIAL NAVIGATION N/A 01/07/2020   Procedure: APPLICATION OF CRANIAL NAVIGATION;  Surgeon: Gillie Duncans, MD;  Location: [facility] OR;  Service: Neurosurgery;  Laterality: N/A;   CRANIOTOMY Right 01/07/2020   Procedure: RIGHT CRANIOTOMY FOR TUMOR RESECTION;  Surgeon: Gillie Duncans, MD;  Location: [facility] OR;  Service: Neurosurgery;  Laterality: Right;  rm 21   ENDOBRONCHIAL ULTRASOUND N/A 12/21/2019   Procedure: ENDOBRONCHIAL ULTRASOUND;  Surgeon: Neda Jennet LABOR, MD;  Location: [facility];  Service: Pulmonary;  Laterality: N/A;   FINE NEEDLE ASPIRATION  12/21/2019   Procedure: FINE NEEDLE ASPIRATION (FNA) LINEAR;  Surgeon:  Neda Jennet LABOR, MD;  Location: [facility];  Service: Pulmonary;;   IR IMAGING GUIDED PORT INSERTION  03/03/2020   VIDEO BRONCHOSCOPY N/A 12/21/2019   Procedure: VIDEO BRONCHOSCOPY WITHOUT FLUORO;  Surgeon: Neda Jennet LABOR, MD;  Location: [facility];  Service: Pulmonary;  Laterality: N/A;     Current Outpatient Medications  Medication  Instructions   aspirin  EC 81 mg, Oral, Daily, Swallow whole.   atorvastatin  (LIPITOR) 20 mg, Oral, Daily   Blood Glucose Monitoring Suppl (BLOOD GLUCOSE MONITOR SYSTEM) w/Device KIT Use as directed to check blood sugar three times daily   Continuous Glucose Sensor (DEXCOM G7 SENSOR) MISC Place new sensor every 10 days. Use to monitor blood sugar continuously.   cyclobenzaprine  (FLEXERIL ) 5 mg, Oral, At bedtime PRN   glucose blood (ACCU-CHEK GUIDE TEST) test strip USE A NEW STRIP EACH TIME TO CHECK YOUR BLOOD SUGAR.   isosorbide  mononitrate (IMDUR ) 60 mg, Daily   Lancet Device MISC 1 each, Does not apply, 3 times daily, May dispense any manufacturer covered by patient's insurance.   Lancets MISC Use to check blood sugar three times daily as directed   levETIRAcetam  (KEPPRA ) 500 mg, Oral, 2 times daily   losartan -hydrochlorothiazide  (HYZAAR) 50-12.5 MG tablet 1 tablet, Oral, Daily   metFORMIN  (GLUCOPHAGE ) 500 mg, Oral, 2 times daily with meals   metoprolol  succinate (TOPROL  XL) 12.5 mg, Oral, Nightly    Social History[1]    Objective  Today's Vitals   04/28/24 0936 04/28/24 1004  BP: (!) 146/63 124/72  Pulse: 86   Temp: 97.6 F (36.4 C)   TempSrc: Oral   SpO2: 100%   Weight: 183 lb 9.6 oz (83.3 kg)   Height: 6' (1.829 m)   PainSc: 0-No pain   Body mass index is 24.9 kg/m.   Physical Exam:   Constitutional: well-appearing male sitting in exam chair, in no acute distress. Ambulates without use of assistance device HEENT: normocephalic atraumatic, mucous membranes moist Cardiovascular: regular rate and rhythm Pulmonary/Chest: normal work of breathing on room air, lungs clear to auscultation bilaterally Abdominal: soft, non-tender, non-distended MSK: normal bulk and tone. Neurological: alert & oriented x 3 Skin: warm and dry Psych: mood calm, behavior normal, thought content normal, judgement normal      The ASCVD Risk score (Arnett DK, et al., 2019) failed to calculate for  the following reasons:   The valid total cholesterol range is 130 to 320 mg/dL      Assessment & Plan  Problem List Items Addressed This Visit     Hypertension associated with type 2 diabetes mellitus (HCC) (Chronic)   BP in the office today 146/63, and 124/72 on manual repeat.  He reports complete adherence to metoprolol  12.5 mg daily and losartan -HCTZ 50-12.5 mg daily.  He reports that he does not take Imdur  60 mg daily.  He states that he did take all of his medications this morning.  He denies any episodes of lightheadedness, dizziness, or headaches.  BMP obtained 12/2023 with normal renal function, and recommend that he continue metoprolol  and losartan -hydrochlorothiazide . - Continue Metoprolol  12.5 mg daily - Continue Losartan -hydrochlorothiazide  50-12.5 mg daily      Relevant Medications   atorvastatin  (LIPITOR) 20 MG tablet   Complex partial seizures (HCC) - first manifestation of brain mets 2021 (Chronic)   Has a history of complex partial seizures secondary to brain metastases from 2021, for which he takes Keppra  500 mg BID. Today he asked whether he needs to continue taking Keppra  500 mg BID, and  per chart review, Dr. Eward note from 02/2024 indicated continuing Keppra  500 mg BID for seizure prophylaxis and at that time sent a 1 year supply refill to his pharmacy.  I advised that I do recommend he continue this and obtain a refill from his pharmacy as the bottle he brought in today with his medications is completely empty. - Continue Keppra  500 mg BID       Hyperlipidemia (Chronic)   Patient has a history of CAD, DMII, and HTN.  Last LDL was 29 on 04/2023.  He has an LDL goal of <55.  He reports complete adherence to atorvastatin  20 mg daily, and I will send refills to his pharmacy.  Discussed obtaining lipid panel, though he would like to defer to his next visit. - Continue Atorvastatin  20 mg daily - f/u in 3 months, obtain lipid panel at that visit       Relevant Medications    atorvastatin  (LIPITOR) 20 MG tablet   Type 2 diabetes mellitus without complication, without long-term current use of insulin  (HCC) - Primary (Chronic)   Patient's last A1c had improved from 13.1 in 12/2023 to 6.0 in 03/2024.  At his last visit he was discontinued from insulin  Lantus  10 units daily.  He was advised to uptitrate his metformin  to maximum dosing, which today patient reports complete adherence to and that he currently takes metformin  1000 mg twice daily.  He has not been having hypoglycemia.  He does have a history of DKA, so SGLT2 inhibitors have been avoided.  He wears prescription glasses and is also due for his ophthalmology exam, so we discussed referral today.  Last LDL in 04/2023 was 29, and he reports complete adherence to atorvastatin  20 mg daily.  Will defer lipid panel today, and recommend obtaining at his follow-up visit in 3 to 4 months. - Continue Metformin  1000 mg BID - Continue dietary and lifestyle modifications  - Ophthalmology referral sent - f/u in 3 months for repeat A1c, lipid panel       Relevant Medications   atorvastatin  (LIPITOR) 20 MG tablet   Other Relevant Orders   Ambulatory referral to Ophthalmology   Other Visit Diagnoses       Subscapular pain, left       Relevant Medications   cyclobenzaprine  (FLEXERIL ) 5 MG tablet       Return in about 4 months (around 08/27/2024) for BP recheck, A1C recheck.  Patient discussed with Dr. Jeanelle.  Doyal Miyamoto, MD [facility]  PGY-1 04/28/2024, 11:38 AM      [1]  Social History Tobacco Use   Smoking status: Former    Current packs/day: 0.00    Average packs/day: 1 pack/day for 49.4 years (49.4 ttl pk-yrs)    Types: Cigarettes    Start date: 08/07/1970    Quit date: 12/15/2019    Years since quitting: 4.3   Smokeless tobacco: Never  Vaping Use   Vaping status: Never Used  Substance Use Topics   Alcohol use: Not Currently   Drug use: Never

## 2024-04-28 NOTE — Assessment & Plan Note (Signed)
 Patient's last A1c had improved from 13.1 in 12/2023 to 6.0 in 03/2024.  At his last visit he was discontinued from insulin  Lantus  10 units daily.  He was advised to uptitrate his metformin  to maximum dosing, which today patient reports complete adherence to and that he currently takes metformin  1000 mg twice daily.  He has not been having hypoglycemia.  He does have a history of DKA, so SGLT2 inhibitors have been avoided.  He wears prescription glasses and is also due for his ophthalmology exam, so we discussed referral today.  Last LDL in 04/2023 was 29, and he reports complete adherence to atorvastatin  20 mg daily.  Will defer lipid panel today, and recommend obtaining at his follow-up visit in 3 to 4 months. - Continue Metformin  1000 mg BID - Continue dietary and lifestyle modifications  - Ophthalmology referral sent - f/u in 3 months for repeat A1c, lipid panel

## 2024-04-29 ENCOUNTER — Ambulatory Visit (INDEPENDENT_AMBULATORY_CARE_PROVIDER_SITE_OTHER): Payer: Medicare HMO

## 2024-04-29 VITALS — Ht 73.0 in | Wt 187.0 lb

## 2024-04-29 DIAGNOSIS — Z0000 Encounter for general adult medical examination without abnormal findings: Secondary | ICD-10-CM | POA: Diagnosis not present

## 2024-04-29 NOTE — Progress Notes (Signed)
 Chief Complaint  Patient presents with   Medicare Wellness    SUBSEQUENT     Subjective:   Jared Shea. is a 68 y.o. male who presents for a Medicare Annual Wellness Visit.  Visit info / Clinical Intake: Medicare Wellness Visit Type:: Subsequent Annual Wellness Visit Persons participating in visit and providing information:: patient Medicare Wellness Visit Mode:: Telephone If telephone:: video declined Since this visit was completed virtually, some vitals may be partially provided or unavailable. Missing vitals are due to the limitations of the virtual format.: Documented vitals are patient reported If Telephone or Video please confirm:: I connected with patient using audio/video enable telemedicine. I verified patient identity with two identifiers, discussed telehealth limitations, and patient agreed to proceed. Patient Location:: HOME Provider Location:: OFFICE Interpreter Needed?: No Pre-visit prep was completed: yes AWV questionnaire completed by patient prior to visit?: no Living arrangements:: with family/others (Lives with son) Patient's Overall Health Status Rating: (!) fair Typical amount of pain: none Does pain affect daily life?: no Are you currently prescribed opioids?: no  Dietary Habits and Nutritional Risks How many meals a day?: 2 Eats fruit and vegetables daily?: (!) no (Try not to eat alot of fruits due to sugar) Most meals are obtained by: preparing own meals In the last 2 weeks, have you had any of the following?: none Diabetic:: (!) yes Any non-healing wounds?: no How often do you check your BS?: continuous glucose monitor Would you like to be referred to a Nutritionist or for Diabetic Management? : no  Functional Status Activities of Daily Living (to include ambulation/medication): Independent Ambulation: Independent with device- listed below Home Assistive Devices/Equipment: Eyeglasses Medication Administration: Independent Home Management  (perform basic housework or laundry): Independent Manage your own finances?: yes Primary transportation is: driving Concerns about vision?: no *vision screening is required for WTM* Concerns about hearing?: no  Fall Screening Falls in the past year?: 0 Number of falls in past year: 0 Was there an injury with Fall?: 0 Fall Risk Category Calculator: 0 Patient Fall Risk Level: Low Fall Risk  Fall Risk Patient at Risk for Falls Due to: No Fall Risks Fall risk Follow up: Falls evaluation completed; Education provided  Home and Transportation Safety: All rugs have non-skid backing?: N/A, no rugs All stairs or steps have railings?: N/A, no stairs Grab bars in the bathtub or shower?: (!) no Have non-skid surface in bathtub or shower?: (!) no Good home lighting?: yes Regular seat belt use?: yes Hospital stays in the last year:: (!) yes How many hospital stays:: 1 Reason: DIABETES  Cognitive Assessment Difficulty concentrating, remembering, or making decisions? : yes Will 6CIT or Mini Cog be Completed: no 6CIT or Mini Cog Declined: patient has a diagnosis of dementia or cognitive impairment  Advance Directives (For Healthcare) Does Patient Have a Medical Advance Directive?: No Does patient want to make changes to medical advance directive?: No - Patient declined Type of Advance Directive: Healthcare Power of [place]; Living will Would patient like information on creating a medical advance directive?: No - Patient declined  Reviewed/Updated  Reviewed/Updated: Reviewed All (Medical, Surgical, Family, Medications, Allergies, Care Teams, Patient Goals)    Allergies (verified) Jardiance  [empagliflozin ] and Penicillins   Current Medications (verified) Outpatient Encounter Medications as of 04/29/2024  Medication Sig   aspirin  EC 81 MG tablet Take 1 tablet (81 mg total) by mouth daily. Swallow whole.   atorvastatin  (LIPITOR) 20 MG tablet Take 1 tablet (20 mg total) by mouth daily.  Blood Glucose Monitoring Suppl (BLOOD GLUCOSE MONITOR SYSTEM) w/Device KIT Use as directed to check blood sugar three times daily   Continuous Glucose Sensor (DEXCOM G7 SENSOR) MISC Place new sensor every 10 days. Use to monitor blood sugar continuously.   cyclobenzaprine  (FLEXERIL ) 5 MG tablet Take 1 tablet (5 mg total) by mouth at bedtime as needed for muscle spasms.   glucose blood (ACCU-CHEK GUIDE TEST) test strip USE A NEW STRIP EACH TIME TO CHECK YOUR BLOOD SUGAR.   isosorbide  mononitrate (IMDUR ) 60 MG 24 hr tablet Take 60 mg by mouth daily.   Lancet Device MISC 1 each by Does not apply route 3 (three) times daily. May dispense any manufacturer covered by patient's insurance.   Lancets MISC Use to check blood sugar three times daily as directed   levETIRAcetam  (KEPPRA ) 500 MG tablet Take 1 tablet (500 mg total) by mouth 2 (two) times daily.   metFORMIN  (GLUCOPHAGE ) 500 MG tablet Take 1 tablet (500 mg total) by mouth 2 (two) times daily with a meal.   metoprolol  succinate (TOPROL  XL) 25 MG 24 hr tablet Take 0.5 tablets (12.5 mg total) by mouth at bedtime.   losartan -hydrochlorothiazide  (HYZAAR) 50-12.5 MG tablet Take 1 tablet by mouth daily. (Patient not taking: Reported on 04/29/2024)   No facility-administered encounter medications on file as of 04/29/2024.    History: Past Medical History:  Diagnosis Date   Adjustment disorder with depressed mood 01/06/2020   At risk for delirium 02/12/2024   Bilateral primary osteoarthritis of knee 12/06/2021   Brain mass    brain cancer   Bursitis of right hip    CAD (coronary artery disease) 12/29/2023   Chest pain 08/12/2018   Chronic cough    Chronic hepatitis C without hepatic coma (HCC) 09/12/2023   Identified on routine screening 09/2023  Genotype 1a (10-2023)              VL 1.6 millioin     Cognitive impairment 03/14/2021   Complex partial seizures (HCC) - first manifestation of brain mets 2021 12/09/2019   Dependence on nicotine   from cigarettes    Erectile dysfunction 07/11/2021   Frequent headaches 06/27/2020   Heart failure with recovered ejection fraction (HFrecEF) (HCC) 12/29/2023   History of diabetic ketoacidosis 12/28/2023   1st episode 12/2024 in setting of sudden poor control (medication non-adherence suspected). Insulin  initiated.     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    History of primary non-small cell carcinoma of left lung 12/31/2019   S/p right frontal craniotomy with tumor resection on 8/26     Hyperlipidemia 02/19/2023   Current Medications: atorvastatin  20mg   Intolerances: None  Risk Factors:CAD, DM, HTN   LDL-C goal: <55     Hypertension associated with type 2 diabetes mellitus (HCC) 07/28/2018   Partial symptomatic epilepsy with complex partial seizures, not intractable, without status epilepticus (HCC) 12/09/2019   Port-A-Cath in place 07/05/2020   PVC (premature ventricular contraction) 05/21/2022   Abnormal rhythm noted on cardiac auscultation and EKG obtained that showed sinus rhythm with premature ventricular complexes. Significant PVC burden with around 30 % on EKG. Has albuterol  but uses it very sparingly with few times a month.      Seizures (HCC)    Thyroid  nodule 01/04/2020   See Non-small cell carcinoma of lung.     01/11/2020 US : The hypermetabolic lesion identified on the prior PET-CT corresponds  with a 1.3 cm TI-RADS category 4 nodule in the deep aspect of the  right mid gland. Hypermetabolic activity on PET is associated with  an approximately 40% malignancy rate and supersedes the TI-RADS  classification system. Recommend further evaluation with fine-needle  asp   Uncontrolled type 2 diabetes mellitus with hyperglycemia, without long-term current use of insulin  (HCC) 01/06/2020   Past Surgical History:  Procedure Laterality Date   APPLICATION OF CRANIAL NAVIGATION N/A 01/07/2020   Procedure: APPLICATION OF CRANIAL NAVIGATION;  Surgeon: Gillie Duncans, MD;  Location:  [facility] OR;  Service: Neurosurgery;  Laterality: N/A;   CRANIOTOMY Right 01/07/2020   Procedure: RIGHT CRANIOTOMY FOR TUMOR RESECTION;  Surgeon: Gillie Duncans, MD;  Location: [facility] OR;  Service: Neurosurgery;  Laterality: Right;  rm 21   ENDOBRONCHIAL ULTRASOUND N/A 12/21/2019   Procedure: ENDOBRONCHIAL ULTRASOUND;  Surgeon: Neda Jennet LABOR, MD;  Location: [facility];  Service: Pulmonary;  Laterality: N/A;   FINE NEEDLE ASPIRATION  12/21/2019   Procedure: FINE NEEDLE ASPIRATION (FNA) LINEAR;  Surgeon: Neda Jennet LABOR, MD;  Location: [facility];  Service: Pulmonary;;   IR IMAGING GUIDED PORT INSERTION  03/03/2020   VIDEO BRONCHOSCOPY N/A 12/21/2019   Procedure: VIDEO BRONCHOSCOPY WITHOUT FLUORO;  Surgeon: Neda Jennet LABOR, MD;  Location: [facility];  Service: Pulmonary;  Laterality: N/A;   Family History  Problem Relation Age of Onset   Heart disease Mother        CABG   Hypertension Mother    Kidney disease Sister    Hypertension Sister    Diabetes Brother    Heart attack Brother    Kidney disease Brother        KIDNEY TRANSPLANT   Healthy Daughter    Healthy Daughter    Healthy Son    Social History   Occupational History   Occupation: Retired  Tobacco Use   Smoking status: Former    Current packs/day: 0.00    Average packs/day: 1 pack/day for 49.4 years (49.4 ttl pk-yrs)    Types: Cigarettes    Start date: 08/07/1970    Quit date: 12/15/2019    Years since quitting: 4.3   Smokeless tobacco: Never  Vaping Use   Vaping status: Never Used  Substance and Sexual Activity   Alcohol use: Not Currently   Drug use: Never   Sexual activity: Not on file    Comment: YES   Tobacco Counseling Counseling given: Not Answered  SDOH Screenings   Food Insecurity: Food Insecurity Present (04/29/2024)  Housing: Low Risk (04/29/2024)  Transportation Needs: No Transportation Needs (04/29/2024)  Utilities: Not At Risk (04/29/2024)  Alcohol Screen: Low Risk (04/24/2023)   Depression (PHQ2-9): Low Risk (04/29/2024)  Financial Resource Strain: Medium Risk (04/24/2023)  Physical Activity: Sufficiently Active (04/29/2024)  Social Connections: Moderately Isolated (04/29/2024)  Stress: No Stress Concern Present (04/29/2024)  Tobacco Use: Medium Risk (04/29/2024)  Health Literacy: Inadequate Health Literacy (04/29/2024)   See flowsheets for full screening details  Depression Screen PHQ 2 & 9 Depression Scale- Over the past 2 weeks, how often have you been bothered by any of the following problems? Little interest or pleasure in doing things: 0 Feeling down, depressed, or hopeless (PHQ Adolescent also includes...irritable): 1 PHQ-2 Total Score: 1 Trouble falling or staying asleep, or sleeping too much: 0 Feeling tired or having little energy: 1 Poor appetite or overeating (PHQ Adolescent also includes...weight loss): 0 Feeling bad about yourself - or that you are a failure or have let yourself or your family down: 0 Trouble concentrating on things, such as reading the newspaper or watching television (PHQ  Adolescent also includes...like school work): 1 Moving or speaking so slowly that other people could have noticed. Or the opposite - being so fidgety or restless that you have been moving around a lot more than usual: 0 Thoughts that you would be better off dead, or of hurting yourself in some way: 0 PHQ-9 Total Score: 3 If you checked off any problems, how difficult have these problems made it for you to do your work, take care of things at home, or get along with other people?: Not difficult at all  Depression Treatment Depression Interventions/Treatment : EYV7-0 Score <4 Follow-up Not Indicated     Goals Addressed             This Visit's Progress    04/29/2024: My goal is to get back to riding my bike.               Objective:    Today's Vitals   04/29/24 0915  Weight: 187 lb (84.8 kg)  Height: 6' 1 (1.854 m)  PainSc: 0-No pain    Body mass index is 24.67 kg/m.  Hearing/Vision screen Hearing Screening - Comments:: Adequate hearing. No hearing aids. Vision Screening - Comments:: Adequate vision with the use of eyeglasses.  Immunizations and Health Maintenance Health Maintenance  Topic Date Due   DTaP/Tdap/Td (1 - Tdap) Never done   Zoster Vaccines- Shingrix (1 of 2) Never done   COVID-19 Vaccine (3 - Pfizer risk series) 10/08/2019   OPHTHALMOLOGY EXAM  03/12/2024   HEMOGLOBIN A1C  06/26/2024   Diabetic kidney evaluation - Urine ACR  01/21/2025   Diabetic kidney evaluation - eGFR measurement  03/27/2025   Medicare Annual Wellness (AWV)  04/29/2025   Pneumococcal Vaccine: 50+ Years  Completed   Influenza Vaccine  Completed   Hepatitis C Screening  Completed   Meningococcal B Vaccine  Aged Out   Hepatitis B Vaccines 19-59 Average Risk  Discontinued   FOOT EXAM  Discontinued   Colonoscopy  Discontinued        Assessment/Plan:  This is a routine wellness examination for [facility].  Patient Care Team: Elicia Sharper, DO as PCP - General Thukkani, Arun K, MD as PCP - Cardiology (Cardiology) Conception Setter, OD as Consulting Physician (Optometry) Sherrod Sherrod, MD as Consulting Physician (Oncology) Buckley Zachary K, MD as Consulting Physician (Psychiatry)  I have personally reviewed and noted the following in the patients chart:   Medical and social history Use of alcohol, tobacco or illicit drugs  Current medications and supplements including opioid prescriptions. Functional ability and status Nutritional status Physical activity Advanced directives List of other physicians Hospitalizations, surgeries, and ER visits in previous 12 months Vitals Screenings to include cognitive, depression, and falls Referrals and appointments  No orders of the defined types were placed in this encounter.  In addition, I have reviewed and discussed with patient certain preventive protocols, quality metrics,  and best practice recommendations. A written personalized care plan for preventive services as well as general preventive health recommendations were provided to patient.   Roz LOISE Fuller, LPN   [id number]/[id number]   Return in 1 year (on 04/29/2025).  After Visit Summary: (MyChart) Due to this being a telephonic visit, the after visit summary with patients personalized plan was offered to patient via MyChart   Nurse Notes: HM Addressed: Vaccines Due: Covid-19, Dtap and Shingrix. Nurse requested eye exam report from Optometrist.

## 2024-04-29 NOTE — Patient Instructions (Signed)
 [name],  Thank you for taking the time for your Medicare Wellness Visit. I appreciate your continued commitment to your health goals. Please review the care plan we discussed, and feel free to reach out if I can assist you further.  Please note that Annual Wellness Visits do not include a physical exam. Some assessments may be limited, especially if the visit was conducted virtually. If needed, we may recommend an in-person follow-up with your provider.  Ongoing Care Seeing your primary care provider every 3 to 6 months helps us  monitor your health and provide consistent, personalized care.   Referrals If a referral was made during today's visit and you haven't received any updates within two weeks, please contact the referred provider directly to check on the status.  Recommended Screenings:  Health Maintenance  Topic Date Due   DTaP/Tdap/Td vaccine (1 - Tdap) Never done   Zoster (Shingles) Vaccine (1 of 2) Never done   COVID-19 Vaccine (3 - Pfizer risk series) 10/08/2019   Eye exam for diabetics  03/12/2024   Medicare Annual Wellness Visit  04/23/2024   Hemoglobin A1C  06/26/2024   Yearly kidney health urinalysis for diabetes  01/21/2025   Yearly kidney function blood test for diabetes  03/27/2025   Pneumococcal Vaccine for age over 73  Completed   Flu Shot  Completed   Hepatitis C Screening  Completed   Meningitis B Vaccine  Aged Out   Hepatitis B Vaccine  Discontinued   Complete foot exam   Discontinued   Colon Cancer Screening  Discontinued       04/29/2024    9:17 AM  Advanced Directives  Does Patient Have a Medical Advance Directive? No  Would patient like information on creating a medical advance directive? No - Patient declined    Vision: Annual vision screenings are recommended for early detection of glaucoma, cataracts, and diabetic retinopathy. These exams can also reveal signs of chronic conditions such as diabetes and high blood pressure.  Dental: Annual  dental screenings help detect early signs of oral cancer, gum disease, and other conditions linked to overall health, including heart disease and diabetes.  Please see the attached documents for additional preventive care recommendations.

## 2024-04-30 NOTE — Progress Notes (Signed)
 Internal Medicine Clinic Attending  Case discussed with the resident at the time of the visit.  We reviewed the resident's history and exam and pertinent patient test results.  I agree with the assessment, diagnosis, and plan of care documented in the resident's note.

## 2024-05-12 NOTE — Progress Notes (Signed)
 Internal Medicine Attending:  I reviewed the AWV findings of the medical professional who conducted the visit. I was present in the office suite and immediately available to provide assistance and direction throughout the time the service was provided.

## 2024-05-15 ENCOUNTER — Telehealth: Payer: Self-pay | Admitting: *Deleted

## 2024-05-15 ENCOUNTER — Encounter: Payer: Self-pay | Admitting: Internal Medicine

## 2024-05-15 NOTE — Telephone Encounter (Addendum)
 Patient here requesting that his PCP call [employer] to confirm his Critical Illness.  [phone].  Member ID- [id number]. Copy of request was placed in the [facility] Box.and message to be forwarded to Dr. Elicia and [facility] Team. .

## 2024-05-25 ENCOUNTER — Other Ambulatory Visit: Payer: Self-pay | Admitting: Student

## 2024-05-25 NOTE — Telephone Encounter (Unsigned)
 Copied from CRM #[id number]. Topic: Clinical - Medication Refill >> May 25, 2024  1:09 PM Debby BROCKS wrote: Medication: Blood Glucose Monitoring Suppl (BLOOD GLUCOSE MONITOR SYSTEM) w/Device KIT  Has the patient contacted their pharmacy? Yes (Agent: If no, request that the patient contact the pharmacy for the refill. If patient does not wish to contact the pharmacy document the reason why and proceed with request.) (Agent: If yes, when and what did the pharmacy advise?)  This is the patient's preferred pharmacy:  [id number] - [place], [place] - [address] AT [facility] OF [address] & [address] [place]  [id number] Phone: [phone] Fax: [phone]  Is this the correct pharmacy for this prescription? Yes If no, delete pharmacy and type the correct one.   Has the prescription been filled recently? No  Is the patient out of the medication? Yes  Has the patient been seen for an appointment in the last year OR does the patient have an upcoming appointment? Yes  Can we respond through MyChart? Yes  Agent: Please be advised that Rx refills may take up to 3 business days. We ask that you follow-up with your pharmacy.

## 2024-06-16 ENCOUNTER — Other Ambulatory Visit: Payer: Self-pay | Admitting: Dietician

## 2024-06-16 DIAGNOSIS — E119 Type 2 diabetes mellitus without complications: Secondary | ICD-10-CM

## 2024-06-16 MED ORDER — DEXCOM G7 RECEIVER DEVI: 0 refills | Status: AC

## 2024-07-27 ENCOUNTER — Ambulatory Visit: Payer: Self-pay | Admitting: Student

## 2024-09-21 ENCOUNTER — Inpatient Hospital Stay: Attending: Internal Medicine

## 2024-09-29 ENCOUNTER — Inpatient Hospital Stay: Admitting: Internal Medicine

## 2025-02-11 ENCOUNTER — Other Ambulatory Visit

## 2025-02-15 ENCOUNTER — Inpatient Hospital Stay: Admitting: Internal Medicine

## 2025-02-15 ENCOUNTER — Inpatient Hospital Stay
